# Patient Record
Sex: Male | Born: 1935 | Race: White | Hispanic: No | Marital: Married | Smoking: Former smoker
Health system: Southern US, Community
[De-identification: ages and names within clinical notes are randomized; demographics above are authoritative.]

## PROBLEM LIST (undated history)

## (undated) DIAGNOSIS — N183 Chronic kidney disease, stage 3 unspecified: Secondary | ICD-10-CM

## (undated) DIAGNOSIS — E7800 Pure hypercholesterolemia, unspecified: Secondary | ICD-10-CM

## (undated) DIAGNOSIS — Z95810 Presence of automatic (implantable) cardiac defibrillator: Secondary | ICD-10-CM

## (undated) DIAGNOSIS — D126 Benign neoplasm of colon, unspecified: Secondary | ICD-10-CM

## (undated) DIAGNOSIS — I8290 Acute embolism and thrombosis of unspecified vein: Secondary | ICD-10-CM

## (undated) DIAGNOSIS — I2510 Atherosclerotic heart disease of native coronary artery without angina pectoris: Secondary | ICD-10-CM

## (undated) DIAGNOSIS — I4729 Other ventricular tachycardia: Secondary | ICD-10-CM

## (undated) DIAGNOSIS — M545 Low back pain, unspecified: Secondary | ICD-10-CM

## (undated) DIAGNOSIS — M1990 Unspecified osteoarthritis, unspecified site: Secondary | ICD-10-CM

## (undated) DIAGNOSIS — I5022 Chronic systolic (congestive) heart failure: Secondary | ICD-10-CM

## (undated) DIAGNOSIS — I472 Ventricular tachycardia, unspecified: Secondary | ICD-10-CM

## (undated) DIAGNOSIS — D500 Iron deficiency anemia secondary to blood loss (chronic): Secondary | ICD-10-CM

## (undated) DIAGNOSIS — I10 Essential (primary) hypertension: Secondary | ICD-10-CM

## (undated) DIAGNOSIS — K5730 Diverticulosis of large intestine without perforation or abscess without bleeding: Secondary | ICD-10-CM

## (undated) DIAGNOSIS — Z8679 Personal history of other diseases of the circulatory system: Secondary | ICD-10-CM

## (undated) DIAGNOSIS — K56699 Other intestinal obstruction unspecified as to partial versus complete obstruction: Secondary | ICD-10-CM

## (undated) DIAGNOSIS — J449 Chronic obstructive pulmonary disease, unspecified: Secondary | ICD-10-CM

## (undated) DIAGNOSIS — H353190 Nonexudative age-related macular degeneration, unspecified eye, stage unspecified: Secondary | ICD-10-CM

## (undated) DIAGNOSIS — G8929 Other chronic pain: Secondary | ICD-10-CM

## (undated) DIAGNOSIS — F411 Generalized anxiety disorder: Secondary | ICD-10-CM

## (undated) DIAGNOSIS — E119 Type 2 diabetes mellitus without complications: Secondary | ICD-10-CM

## (undated) DIAGNOSIS — I739 Peripheral vascular disease, unspecified: Secondary | ICD-10-CM

## (undated) DIAGNOSIS — L219 Seborrheic dermatitis, unspecified: Secondary | ICD-10-CM

## (undated) DIAGNOSIS — M19049 Primary osteoarthritis, unspecified hand: Secondary | ICD-10-CM

## (undated) HISTORY — DX: Chronic kidney disease, stage 3 unspecified: N18.30

## (undated) HISTORY — DX: Iron deficiency anemia secondary to blood loss (chronic): D50.0

## (undated) HISTORY — DX: Essential (primary) hypertension: I10

## (undated) HISTORY — DX: Nonexudative age-related macular degeneration, unspecified eye, stage unspecified: H35.3190

## (undated) HISTORY — PX: TONSILLECTOMY: SUR1361

## (undated) HISTORY — DX: Generalized anxiety disorder: F41.1

## (undated) HISTORY — PX: OTHER SURGICAL HISTORY: SHX169

## (undated) HISTORY — DX: Chronic kidney disease, stage 3 (moderate): N18.3

## (undated) HISTORY — DX: Chronic obstructive pulmonary disease, unspecified: J44.9

## (undated) HISTORY — DX: Benign neoplasm of colon, unspecified: D12.6

## (undated) HISTORY — DX: Seborrheic dermatitis, unspecified: L21.9

## (undated) HISTORY — PX: TRANSTHORACIC ECHOCARDIOGRAM: SHX275

## (undated) HISTORY — DX: Pure hypercholesterolemia, unspecified: E78.00

## (undated) HISTORY — PX: CARDIOVASCULAR STRESS TEST: SHX262

## (undated) HISTORY — DX: Other intestinal obstruction unspecified as to partial versus complete obstruction: K56.699

## (undated) HISTORY — DX: Ventricular tachycardia, unspecified: I47.20

## (undated) HISTORY — DX: Atherosclerotic heart disease of native coronary artery without angina pectoris: I25.10

## (undated) HISTORY — DX: Diverticulosis of large intestine without perforation or abscess without bleeding: K57.30

## (undated) HISTORY — DX: Peripheral vascular disease, unspecified: I73.9

## (undated) HISTORY — DX: Ventricular tachycardia: I47.2

## (undated) HISTORY — DX: Chronic systolic (congestive) heart failure: I50.22

## (undated) HISTORY — DX: Other ventricular tachycardia: I47.29

---

## 1989-01-24 HISTORY — PX: CORONARY ARTERY BYPASS GRAFT: SHX141

## 2001-03-18 ENCOUNTER — Ambulatory Visit (HOSPITAL_COMMUNITY): Admission: RE | Admit: 2001-03-18 | Discharge: 2001-03-18 | Payer: Self-pay | Admitting: Gastroenterology

## 2001-03-18 ENCOUNTER — Encounter (INDEPENDENT_AMBULATORY_CARE_PROVIDER_SITE_OTHER): Payer: Self-pay | Admitting: Specialist

## 2001-03-31 ENCOUNTER — Ambulatory Visit (HOSPITAL_COMMUNITY): Admission: RE | Admit: 2001-03-31 | Discharge: 2001-03-31 | Payer: Self-pay | Admitting: Gastroenterology

## 2001-03-31 ENCOUNTER — Encounter: Payer: Self-pay | Admitting: Gastroenterology

## 2001-06-29 ENCOUNTER — Ambulatory Visit: Admission: RE | Admit: 2001-06-29 | Discharge: 2001-06-29 | Payer: Self-pay | Admitting: Surgery

## 2001-06-29 ENCOUNTER — Encounter: Admission: RE | Admit: 2001-06-29 | Discharge: 2001-06-29 | Payer: Self-pay | Admitting: Surgery

## 2001-06-29 ENCOUNTER — Encounter: Payer: Self-pay | Admitting: Surgery

## 2001-07-05 ENCOUNTER — Inpatient Hospital Stay (HOSPITAL_COMMUNITY): Admission: AD | Admit: 2001-07-05 | Discharge: 2001-07-10 | Payer: Self-pay | Admitting: Cardiology

## 2001-07-05 ENCOUNTER — Encounter: Payer: Self-pay | Admitting: Thoracic Surgery (Cardiothoracic Vascular Surgery)

## 2001-07-06 ENCOUNTER — Encounter: Payer: Self-pay | Admitting: Thoracic Surgery (Cardiothoracic Vascular Surgery)

## 2001-07-07 ENCOUNTER — Encounter: Payer: Self-pay | Admitting: Thoracic Surgery (Cardiothoracic Vascular Surgery)

## 2001-07-08 ENCOUNTER — Encounter: Payer: Self-pay | Admitting: Thoracic Surgery (Cardiothoracic Vascular Surgery)

## 2001-08-06 ENCOUNTER — Ambulatory Visit (HOSPITAL_COMMUNITY): Admission: RE | Admit: 2001-08-06 | Discharge: 2001-08-07 | Payer: Self-pay | Admitting: *Deleted

## 2002-12-10 ENCOUNTER — Inpatient Hospital Stay (HOSPITAL_COMMUNITY): Admission: EM | Admit: 2002-12-10 | Discharge: 2002-12-11 | Payer: Self-pay | Admitting: *Deleted

## 2002-12-10 ENCOUNTER — Encounter: Payer: Self-pay | Admitting: Endocrinology

## 2004-03-29 ENCOUNTER — Ambulatory Visit: Payer: Self-pay | Admitting: Pulmonary Disease

## 2004-05-29 ENCOUNTER — Ambulatory Visit: Payer: Self-pay | Admitting: Pulmonary Disease

## 2004-06-12 ENCOUNTER — Ambulatory Visit: Payer: Self-pay | Admitting: Pulmonary Disease

## 2004-07-09 ENCOUNTER — Ambulatory Visit: Payer: Self-pay | Admitting: Pulmonary Disease

## 2004-07-12 ENCOUNTER — Ambulatory Visit: Payer: Self-pay | Admitting: Pulmonary Disease

## 2004-07-12 ENCOUNTER — Ambulatory Visit (HOSPITAL_COMMUNITY): Admission: RE | Admit: 2004-07-12 | Discharge: 2004-07-12 | Payer: Self-pay | Admitting: Pulmonary Disease

## 2004-08-06 ENCOUNTER — Ambulatory Visit: Payer: Self-pay | Admitting: Pulmonary Disease

## 2004-10-18 ENCOUNTER — Ambulatory Visit: Payer: Self-pay | Admitting: Pulmonary Disease

## 2004-11-06 ENCOUNTER — Ambulatory Visit: Payer: Self-pay | Admitting: Pulmonary Disease

## 2004-12-21 ENCOUNTER — Encounter: Payer: Self-pay | Admitting: Emergency Medicine

## 2004-12-21 ENCOUNTER — Ambulatory Visit: Payer: Self-pay | Admitting: Infectious Diseases

## 2004-12-21 ENCOUNTER — Ambulatory Visit: Payer: Self-pay | Admitting: Dentistry

## 2004-12-21 ENCOUNTER — Inpatient Hospital Stay (HOSPITAL_COMMUNITY): Admission: AD | Admit: 2004-12-21 | Discharge: 2004-12-28 | Payer: Self-pay | Admitting: Cardiology

## 2004-12-23 ENCOUNTER — Ambulatory Visit: Payer: Self-pay | Admitting: Internal Medicine

## 2004-12-24 HISTORY — PX: CARDIAC DEFIBRILLATOR PLACEMENT: SHX171

## 2005-01-13 ENCOUNTER — Ambulatory Visit: Payer: Self-pay

## 2005-01-28 ENCOUNTER — Ambulatory Visit: Payer: Self-pay

## 2005-03-25 ENCOUNTER — Ambulatory Visit: Payer: Self-pay | Admitting: Pulmonary Disease

## 2005-04-03 ENCOUNTER — Ambulatory Visit: Payer: Self-pay | Admitting: Cardiology

## 2005-05-08 ENCOUNTER — Ambulatory Visit: Payer: Self-pay | Admitting: Cardiology

## 2005-05-23 ENCOUNTER — Ambulatory Visit: Payer: Self-pay | Admitting: Cardiology

## 2005-06-05 ENCOUNTER — Ambulatory Visit: Payer: Self-pay | Admitting: Cardiology

## 2005-06-12 ENCOUNTER — Ambulatory Visit: Payer: Self-pay | Admitting: Cardiology

## 2005-07-18 ENCOUNTER — Ambulatory Visit: Payer: Self-pay | Admitting: Cardiology

## 2005-07-22 ENCOUNTER — Ambulatory Visit: Payer: Self-pay | Admitting: Pulmonary Disease

## 2005-07-23 ENCOUNTER — Ambulatory Visit: Payer: Self-pay | Admitting: Pulmonary Disease

## 2005-07-24 ENCOUNTER — Ambulatory Visit: Payer: Self-pay | Admitting: Pulmonary Disease

## 2005-07-25 ENCOUNTER — Ambulatory Visit: Payer: Self-pay | Admitting: Internal Medicine

## 2005-07-25 ENCOUNTER — Inpatient Hospital Stay (HOSPITAL_COMMUNITY): Admission: EM | Admit: 2005-07-25 | Discharge: 2005-07-29 | Payer: Self-pay | Admitting: Emergency Medicine

## 2005-08-05 ENCOUNTER — Ambulatory Visit: Payer: Self-pay | Admitting: Pulmonary Disease

## 2005-08-19 ENCOUNTER — Inpatient Hospital Stay (HOSPITAL_COMMUNITY): Admission: EM | Admit: 2005-08-19 | Discharge: 2005-08-21 | Payer: Self-pay | Admitting: Emergency Medicine

## 2005-08-19 ENCOUNTER — Ambulatory Visit: Payer: Self-pay | Admitting: Pulmonary Disease

## 2005-08-19 ENCOUNTER — Ambulatory Visit: Payer: Self-pay | Admitting: Cardiology

## 2005-08-28 ENCOUNTER — Ambulatory Visit: Payer: Self-pay | Admitting: Pulmonary Disease

## 2005-10-02 ENCOUNTER — Ambulatory Visit: Payer: Self-pay | Admitting: Cardiology

## 2005-10-08 ENCOUNTER — Ambulatory Visit: Payer: Self-pay | Admitting: Pulmonary Disease

## 2005-11-04 ENCOUNTER — Ambulatory Visit: Payer: Self-pay | Admitting: Cardiology

## 2005-11-28 ENCOUNTER — Ambulatory Visit: Payer: Self-pay | Admitting: Internal Medicine

## 2005-12-01 ENCOUNTER — Ambulatory Visit: Payer: Self-pay | Admitting: Pulmonary Disease

## 2006-01-16 ENCOUNTER — Ambulatory Visit: Payer: Self-pay

## 2006-01-29 ENCOUNTER — Ambulatory Visit: Payer: Self-pay | Admitting: Cardiology

## 2006-02-11 ENCOUNTER — Ambulatory Visit: Payer: Self-pay

## 2006-03-09 ENCOUNTER — Ambulatory Visit: Payer: Self-pay | Admitting: Pulmonary Disease

## 2006-03-16 ENCOUNTER — Ambulatory Visit: Payer: Self-pay | Admitting: Pulmonary Disease

## 2006-03-16 LAB — CONVERTED CEMR LAB
Albumin: 3.9 g/dL (ref 3.5–5.2)
Alkaline Phosphatase: 45 units/L (ref 39–117)
BUN: 19 mg/dL (ref 6–23)
Basophils Absolute: 0 10*3/uL (ref 0.0–0.1)
CO2: 30 meq/L (ref 19–32)
Calcium: 9.1 mg/dL (ref 8.4–10.5)
Chol/HDL Ratio, serum: 6.4
Creatinine, Ser: 1.7 mg/dL — ABNORMAL HIGH (ref 0.4–1.5)
Eosinophil percent: 2.4 % (ref 0.0–5.0)
GFR calc non Af Amer: 43 mL/min
HCT: 40.1 % (ref 39.0–52.0)
HDL: 24.2 mg/dL — ABNORMAL LOW (ref >39.0)
Lymphocytes Relative: 21.4 % (ref 12.0–46.0)
MCHC: 32.9 g/dL (ref 30.0–36.0)
Monocytes Relative: 11.2 % — ABNORMAL HIGH (ref 3.0–11.0)
Neutro Abs: 7 10*3/uL (ref 1.4–7.7)
RBC: 4.45 M/uL (ref 4.22–5.81)
Sodium: 140 meq/L (ref 135–145)
Total Protein: 6.6 g/dL (ref 6.0–8.3)
VLDL: 33 mg/dL (ref 0–40)
WBC: 10.8 10*3/uL — ABNORMAL HIGH (ref 4.5–10.5)

## 2006-04-06 ENCOUNTER — Ambulatory Visit: Payer: Self-pay

## 2006-06-09 ENCOUNTER — Ambulatory Visit: Payer: Self-pay | Admitting: Pulmonary Disease

## 2006-07-11 ENCOUNTER — Emergency Department (HOSPITAL_COMMUNITY): Admission: EM | Admit: 2006-07-11 | Discharge: 2006-07-11 | Payer: Self-pay | Admitting: Emergency Medicine

## 2006-07-11 ENCOUNTER — Ambulatory Visit: Payer: Self-pay | Admitting: Internal Medicine

## 2006-08-07 ENCOUNTER — Ambulatory Visit: Payer: Self-pay | Admitting: Pulmonary Disease

## 2006-08-07 LAB — CONVERTED CEMR LAB
ALT: 22 units/L (ref 0–40)
Albumin: 4 g/dL (ref 3.5–5.2)
BUN: 24 mg/dL — ABNORMAL HIGH (ref 6–23)
Calcium: 9.3 mg/dL (ref 8.4–10.5)
Cholesterol: 164 mg/dL (ref 0–200)
Eosinophils Relative: 3 % (ref 0.0–5.0)
GFR calc Af Amer: 55 mL/min
GFR calc non Af Amer: 46 mL/min
Glucose, Bld: 119 mg/dL — ABNORMAL HIGH (ref 70–99)
HCT: 42.7 % (ref 39.0–52.0)
Hgb A1c MFr Bld: 7.6 % — ABNORMAL HIGH (ref 4.6–6.0)
Lymphocytes Relative: 35.9 % (ref 12.0–46.0)
MCHC: 33.4 g/dL (ref 30.0–36.0)
MCV: 86.8 fL (ref 78.0–100.0)
Monocytes Relative: 10.6 % (ref 3.0–11.0)
Neutrophils Relative %: 50.2 % (ref 43.0–77.0)
RBC: 4.92 M/uL (ref 4.22–5.81)
RDW: 14.1 % (ref 11.5–14.6)
Sodium: 141 meq/L (ref 135–145)
TSH: 1.66 microintl units/mL (ref 0.35–5.50)
Total Bilirubin: 0.7 mg/dL (ref 0.3–1.2)
Total Protein: 6.5 g/dL (ref 6.0–8.3)
WBC: 8.1 10*3/uL (ref 4.5–10.5)

## 2006-09-08 ENCOUNTER — Ambulatory Visit: Payer: Self-pay | Admitting: Pulmonary Disease

## 2006-12-23 ENCOUNTER — Ambulatory Visit: Payer: Self-pay

## 2007-01-06 ENCOUNTER — Ambulatory Visit: Payer: Self-pay | Admitting: Pulmonary Disease

## 2007-01-15 ENCOUNTER — Ambulatory Visit: Payer: Self-pay | Admitting: Pulmonary Disease

## 2007-01-15 LAB — CONVERTED CEMR LAB
AST: 23 units/L (ref 0–37)
Albumin: 3.9 g/dL (ref 3.5–5.2)
Basophils Relative: 0.3 % (ref 0.0–1.0)
Chloride: 105 meq/L (ref 96–112)
Direct LDL: 69 mg/dL
Eosinophils Relative: 3 % (ref 0.0–5.0)
Glucose, Bld: 114 mg/dL — ABNORMAL HIGH (ref 70–99)
Monocytes Absolute: 1 10*3/uL — ABNORMAL HIGH (ref 0.2–0.7)
PSA: 0.66 ng/mL (ref 0.10–4.00)
Platelets: 211 10*3/uL (ref 150–400)
Pro B Natriuretic peptide (BNP): 40 pg/mL (ref 0.0–100.0)
RBC: 4.8 M/uL (ref 4.22–5.81)
RDW: 13.2 % (ref 11.5–14.6)
Total CHOL/HDL Ratio: 6.1
Total Protein: 6.5 g/dL (ref 6.0–8.3)
Triglycerides: 297 mg/dL (ref 0–149)
WBC: 8.3 10*3/uL (ref 4.5–10.5)

## 2007-01-22 ENCOUNTER — Ambulatory Visit: Payer: Self-pay | Admitting: Pulmonary Disease

## 2007-01-22 LAB — CONVERTED CEMR LAB
CO2: 30 meq/L (ref 19–32)
Creatinine, Ser: 1.3 mg/dL (ref 0.4–1.5)
GFR calc Af Amer: 70 mL/min
GFR calc non Af Amer: 58 mL/min
Glucose, Bld: 171 mg/dL — ABNORMAL HIGH (ref 70–99)
Potassium: 5.4 meq/L — ABNORMAL HIGH (ref 3.5–5.1)

## 2007-03-10 ENCOUNTER — Inpatient Hospital Stay (HOSPITAL_COMMUNITY): Admission: EM | Admit: 2007-03-10 | Discharge: 2007-03-13 | Payer: Self-pay | Admitting: Emergency Medicine

## 2007-03-10 ENCOUNTER — Ambulatory Visit: Payer: Self-pay | Admitting: Cardiology

## 2007-03-10 ENCOUNTER — Ambulatory Visit: Payer: Self-pay | Admitting: Internal Medicine

## 2007-03-10 LAB — CONVERTED CEMR LAB
ALT: 22 units/L (ref 0–53)
Albumin: 4.1 g/dL (ref 3.5–5.2)
Alkaline Phosphatase: 75 units/L (ref 39–117)
Basophils Relative: 0.5 % (ref 0.0–1.0)
Bilirubin, Direct: 0.1 mg/dL (ref 0.0–0.3)
CO2: 31 meq/L (ref 19–32)
Calcium: 9.2 mg/dL (ref 8.4–10.5)
GFR calc non Af Amer: 53 mL/min
HCT: 40.9 % (ref 39.0–52.0)
Hemoglobin: 14.2 g/dL (ref 13.0–17.0)
MCV: 88.4 fL (ref 78.0–100.0)
Magnesium: 1.4 mg/dL — ABNORMAL LOW (ref 1.5–2.5)
Monocytes Relative: 9.8 % (ref 3.0–11.0)
Neutrophils Relative %: 56.9 % (ref 43.0–77.0)
RDW: 13.1 % (ref 11.5–14.6)
Total Protein: 6.6 g/dL (ref 6.0–8.3)

## 2007-03-24 ENCOUNTER — Ambulatory Visit: Payer: Self-pay | Admitting: Pulmonary Disease

## 2007-03-24 DIAGNOSIS — I739 Peripheral vascular disease, unspecified: Secondary | ICD-10-CM | POA: Insufficient documentation

## 2007-03-24 DIAGNOSIS — I10 Essential (primary) hypertension: Secondary | ICD-10-CM

## 2007-03-24 DIAGNOSIS — D126 Benign neoplasm of colon, unspecified: Secondary | ICD-10-CM

## 2007-03-24 DIAGNOSIS — E7800 Pure hypercholesterolemia, unspecified: Secondary | ICD-10-CM | POA: Insufficient documentation

## 2007-03-24 LAB — CONVERTED CEMR LAB
GFR calc Af Amer: 52 mL/min
Potassium: 5.5 meq/L — ABNORMAL HIGH (ref 3.5–5.1)

## 2007-04-01 ENCOUNTER — Ambulatory Visit: Payer: Self-pay | Admitting: Internal Medicine

## 2007-04-01 LAB — CONVERTED CEMR LAB
CO2: 32 meq/L (ref 19–32)
Chloride: 102 meq/L (ref 96–112)
GFR calc Af Amer: 45 mL/min
GFR calc non Af Amer: 38 mL/min
Pro B Natriuretic peptide (BNP): 191 pg/mL — ABNORMAL HIGH (ref 0.0–100.0)
Sodium: 140 meq/L (ref 135–145)

## 2007-04-02 ENCOUNTER — Ambulatory Visit: Payer: Self-pay | Admitting: Cardiology

## 2007-04-08 ENCOUNTER — Ambulatory Visit: Payer: Self-pay | Admitting: Pulmonary Disease

## 2007-05-24 ENCOUNTER — Telehealth: Payer: Self-pay | Admitting: Pulmonary Disease

## 2007-05-25 ENCOUNTER — Ambulatory Visit: Payer: Self-pay | Admitting: Pulmonary Disease

## 2007-07-02 LAB — CONVERTED CEMR LAB
BUN: 22 mg/dL (ref 6–23)
Calcium: 9 mg/dL (ref 8.4–10.5)
Chloride: 98 meq/L (ref 96–112)
Creatinine, Ser: 1.7 mg/dL — ABNORMAL HIGH (ref 0.4–1.5)
GFR calc Af Amer: 52 mL/min
Glucose, Bld: 169 mg/dL — ABNORMAL HIGH (ref 70–99)
Sodium: 140 meq/L (ref 135–145)

## 2007-07-04 ENCOUNTER — Emergency Department (HOSPITAL_COMMUNITY): Admission: EM | Admit: 2007-07-04 | Discharge: 2007-07-05 | Payer: Self-pay | Admitting: Emergency Medicine

## 2007-07-05 ENCOUNTER — Telehealth (INDEPENDENT_AMBULATORY_CARE_PROVIDER_SITE_OTHER): Payer: Self-pay | Admitting: *Deleted

## 2007-07-06 ENCOUNTER — Ambulatory Visit: Payer: Self-pay | Admitting: Pulmonary Disease

## 2007-07-06 LAB — CONVERTED CEMR LAB
BUN: 17 mg/dL (ref 6–23)
Chloride: 101 meq/L (ref 96–112)
Creatinine, Ser: 1.6 mg/dL — ABNORMAL HIGH (ref 0.4–1.5)
GFR calc Af Amer: 55 mL/min
Glucose, Bld: 83 mg/dL (ref 70–99)
Hgb A1c MFr Bld: 6.8 % — ABNORMAL HIGH (ref 4.6–6.0)

## 2007-08-02 ENCOUNTER — Ambulatory Visit: Payer: Self-pay | Admitting: Pulmonary Disease

## 2007-08-02 DIAGNOSIS — F411 Generalized anxiety disorder: Secondary | ICD-10-CM | POA: Insufficient documentation

## 2007-08-02 DIAGNOSIS — K5730 Diverticulosis of large intestine without perforation or abscess without bleeding: Secondary | ICD-10-CM | POA: Insufficient documentation

## 2007-08-02 DIAGNOSIS — N259 Disorder resulting from impaired renal tubular function, unspecified: Secondary | ICD-10-CM | POA: Insufficient documentation

## 2007-08-02 DIAGNOSIS — I2510 Atherosclerotic heart disease of native coronary artery without angina pectoris: Secondary | ICD-10-CM

## 2007-08-02 DIAGNOSIS — M1990 Unspecified osteoarthritis, unspecified site: Secondary | ICD-10-CM | POA: Insufficient documentation

## 2007-08-02 DIAGNOSIS — E119 Type 2 diabetes mellitus without complications: Secondary | ICD-10-CM

## 2007-08-02 DIAGNOSIS — J209 Acute bronchitis, unspecified: Secondary | ICD-10-CM | POA: Insufficient documentation

## 2007-08-02 DIAGNOSIS — I472 Ventricular tachycardia: Secondary | ICD-10-CM

## 2007-09-01 ENCOUNTER — Ambulatory Visit: Payer: Self-pay | Admitting: Pulmonary Disease

## 2007-10-08 ENCOUNTER — Ambulatory Visit: Payer: Self-pay | Admitting: Cardiology

## 2007-10-12 ENCOUNTER — Ambulatory Visit: Payer: Self-pay | Admitting: Cardiology

## 2007-10-12 LAB — CONVERTED CEMR LAB
Albumin: 4 g/dL (ref 3.5–5.2)
Bilirubin, Direct: 0.1 mg/dL (ref 0.0–0.3)
Calcium: 9.3 mg/dL (ref 8.4–10.5)
Chloride: 104 meq/L (ref 96–112)
Glucose, Bld: 113 mg/dL — ABNORMAL HIGH (ref 70–99)
Potassium: 5.1 meq/L (ref 3.5–5.1)

## 2007-11-22 ENCOUNTER — Ambulatory Visit: Payer: Self-pay | Admitting: Pulmonary Disease

## 2007-12-29 ENCOUNTER — Telehealth (INDEPENDENT_AMBULATORY_CARE_PROVIDER_SITE_OTHER): Payer: Self-pay | Admitting: *Deleted

## 2007-12-31 ENCOUNTER — Ambulatory Visit: Payer: Self-pay | Admitting: Pulmonary Disease

## 2008-01-07 LAB — CONVERTED CEMR LAB
Albumin: 3.9 g/dL (ref 3.5–5.2)
BUN: 14 mg/dL (ref 6–23)
Bilirubin, Direct: 0.1 mg/dL (ref 0.0–0.3)
Calcium: 9.1 mg/dL (ref 8.4–10.5)
Chloride: 107 meq/L (ref 96–112)
Cholesterol: 124 mg/dL (ref 0–200)
Direct LDL: 64.7 mg/dL
GFR calc non Af Amer: 64 mL/min
Glucose, Bld: 118 mg/dL — ABNORMAL HIGH (ref 70–99)
Hgb A1c MFr Bld: 7.3 % — ABNORMAL HIGH (ref 4.6–6.0)
Sodium: 142 meq/L (ref 135–145)
TSH: 2.87 microintl units/mL (ref 0.35–5.50)
Total Protein: 6.5 g/dL (ref 6.0–8.3)
VLDL: 51 mg/dL — ABNORMAL HIGH (ref 0–40)

## 2008-02-08 ENCOUNTER — Ambulatory Visit: Payer: Self-pay | Admitting: Internal Medicine

## 2008-02-23 ENCOUNTER — Ambulatory Visit: Payer: Self-pay | Admitting: Pulmonary Disease

## 2008-03-13 ENCOUNTER — Encounter: Payer: Self-pay | Admitting: Pulmonary Disease

## 2008-03-13 ENCOUNTER — Ambulatory Visit: Payer: Self-pay

## 2008-04-27 ENCOUNTER — Ambulatory Visit: Payer: Self-pay

## 2008-05-29 ENCOUNTER — Telehealth (INDEPENDENT_AMBULATORY_CARE_PROVIDER_SITE_OTHER): Payer: Self-pay | Admitting: *Deleted

## 2008-06-06 ENCOUNTER — Ambulatory Visit: Payer: Self-pay | Admitting: Pulmonary Disease

## 2008-06-12 ENCOUNTER — Telehealth (INDEPENDENT_AMBULATORY_CARE_PROVIDER_SITE_OTHER): Payer: Self-pay | Admitting: *Deleted

## 2008-06-13 ENCOUNTER — Ambulatory Visit: Payer: Self-pay | Admitting: Pulmonary Disease

## 2008-06-13 LAB — CONVERTED CEMR LAB
CO2: 35 meq/L — ABNORMAL HIGH (ref 19–32)
Creatinine, Ser: 1.9 mg/dL — ABNORMAL HIGH (ref 0.4–1.5)
GFR calc Af Amer: 45 mL/min
GFR calc non Af Amer: 37 mL/min
Glucose, Bld: 115 mg/dL — ABNORMAL HIGH (ref 70–99)
Sodium: 142 meq/L (ref 135–145)

## 2008-06-16 ENCOUNTER — Ambulatory Visit: Payer: Self-pay | Admitting: Cardiology

## 2008-06-19 ENCOUNTER — Ambulatory Visit: Payer: Self-pay | Admitting: Pulmonary Disease

## 2008-06-20 LAB — CONVERTED CEMR LAB
CO2: 34 meq/L — ABNORMAL HIGH (ref 19–32)
Chloride: 104 meq/L (ref 96–112)
GFR calc Af Amer: 55 mL/min
Glucose, Bld: 85 mg/dL (ref 70–99)

## 2008-07-04 ENCOUNTER — Telehealth (INDEPENDENT_AMBULATORY_CARE_PROVIDER_SITE_OTHER): Payer: Self-pay | Admitting: *Deleted

## 2008-08-04 ENCOUNTER — Encounter: Payer: Self-pay | Admitting: Internal Medicine

## 2008-08-10 ENCOUNTER — Ambulatory Visit: Payer: Self-pay

## 2008-10-03 ENCOUNTER — Encounter (INDEPENDENT_AMBULATORY_CARE_PROVIDER_SITE_OTHER): Payer: Self-pay | Admitting: *Deleted

## 2008-11-10 ENCOUNTER — Encounter (INDEPENDENT_AMBULATORY_CARE_PROVIDER_SITE_OTHER): Payer: Self-pay | Admitting: *Deleted

## 2008-11-16 ENCOUNTER — Ambulatory Visit: Payer: Self-pay

## 2009-01-22 ENCOUNTER — Encounter: Payer: Self-pay | Admitting: Internal Medicine

## 2009-01-22 ENCOUNTER — Ambulatory Visit: Payer: Self-pay | Admitting: Cardiology

## 2009-01-30 ENCOUNTER — Ambulatory Visit: Payer: Self-pay | Admitting: Cardiology

## 2009-01-30 ENCOUNTER — Ambulatory Visit: Payer: Self-pay | Admitting: Internal Medicine

## 2009-01-30 DIAGNOSIS — I5022 Chronic systolic (congestive) heart failure: Secondary | ICD-10-CM

## 2009-01-31 ENCOUNTER — Ambulatory Visit: Payer: Self-pay | Admitting: Pulmonary Disease

## 2009-02-01 ENCOUNTER — Telehealth (INDEPENDENT_AMBULATORY_CARE_PROVIDER_SITE_OTHER): Payer: Self-pay | Admitting: *Deleted

## 2009-02-07 ENCOUNTER — Telehealth: Payer: Self-pay | Admitting: Internal Medicine

## 2009-02-14 ENCOUNTER — Ambulatory Visit: Payer: Self-pay | Admitting: Pulmonary Disease

## 2009-02-14 DIAGNOSIS — R748 Abnormal levels of other serum enzymes: Secondary | ICD-10-CM | POA: Insufficient documentation

## 2009-02-15 LAB — CONVERTED CEMR LAB
Albumin: 4 g/dL (ref 3.5–5.2)
Alkaline Phosphatase: 31 units/L — ABNORMAL LOW (ref 39–117)
Basophils Absolute: 0 10*3/uL (ref 0.0–0.1)
Basophils Relative: 0.5 % (ref 0.0–3.0)
Bilirubin, Direct: 0.2 mg/dL (ref 0.0–0.3)
CO2: 36 meq/L — ABNORMAL HIGH (ref 19–32)
Eosinophils Absolute: 0.2 10*3/uL (ref 0.0–0.7)
Eosinophils Relative: 2.9 % (ref 0.0–5.0)
HCT: 44.1 % (ref 39.0–52.0)
HDL: 25.6 mg/dL — ABNORMAL LOW (ref >39.00)
Hemoglobin: 14.7 g/dL (ref 13.0–17.0)
MCV: 90.1 fL (ref 78.0–100.0)
Monocytes Absolute: 0.9 10*3/uL (ref 0.1–1.0)
Neutro Abs: 3.5 10*3/uL (ref 1.4–7.7)
PSA: 0.47 ng/mL (ref 0.10–4.00)
Platelets: 172 10*3/uL (ref 150.0–400.0)
Potassium: 5.6 meq/L — ABNORMAL HIGH (ref 3.5–5.1)
Sodium: 143 meq/L (ref 135–145)
Total CHOL/HDL Ratio: 5
Total Protein: 6.9 g/dL (ref 6.0–8.3)
Triglycerides: 142 mg/dL (ref 0.0–149.0)
VLDL: 28.4 mg/dL (ref 0.0–40.0)
WBC: 7.2 10*3/uL (ref 4.5–10.5)

## 2009-03-15 ENCOUNTER — Encounter (INDEPENDENT_AMBULATORY_CARE_PROVIDER_SITE_OTHER): Payer: Self-pay | Admitting: *Deleted

## 2009-04-30 ENCOUNTER — Ambulatory Visit: Payer: Self-pay | Admitting: Diagnostic Radiology

## 2009-04-30 ENCOUNTER — Emergency Department (HOSPITAL_BASED_OUTPATIENT_CLINIC_OR_DEPARTMENT_OTHER): Admission: EM | Admit: 2009-04-30 | Discharge: 2009-04-30 | Payer: Self-pay | Admitting: Emergency Medicine

## 2009-05-08 ENCOUNTER — Ambulatory Visit: Payer: Self-pay | Admitting: Cardiology

## 2009-05-11 ENCOUNTER — Ambulatory Visit: Payer: Self-pay | Admitting: Adult Health

## 2009-05-11 ENCOUNTER — Telehealth (INDEPENDENT_AMBULATORY_CARE_PROVIDER_SITE_OTHER): Payer: Self-pay | Admitting: *Deleted

## 2009-05-22 ENCOUNTER — Ambulatory Visit: Payer: Self-pay | Admitting: Cardiology

## 2009-05-24 LAB — CONVERTED CEMR LAB
BUN: 32 mg/dL — ABNORMAL HIGH (ref 6–23)
CO2: 33 meq/L — ABNORMAL HIGH (ref 19–32)
Calcium: 9.3 mg/dL (ref 8.4–10.5)
Creatinine, Ser: 1.7 mg/dL — ABNORMAL HIGH (ref 0.4–1.5)
GFR calc non Af Amer: 42.34 mL/min (ref >60)
Glucose, Bld: 184 mg/dL — ABNORMAL HIGH (ref 70–99)

## 2009-05-29 ENCOUNTER — Telehealth: Payer: Self-pay | Admitting: Cardiology

## 2009-05-30 ENCOUNTER — Ambulatory Visit: Payer: Self-pay | Admitting: Pulmonary Disease

## 2009-05-30 DIAGNOSIS — L219 Seborrheic dermatitis, unspecified: Secondary | ICD-10-CM

## 2009-06-11 ENCOUNTER — Ambulatory Visit: Payer: Self-pay | Admitting: Cardiology

## 2009-06-17 LAB — CONVERTED CEMR LAB: Chloride: 100 meq/L (ref 96–112)

## 2009-08-06 ENCOUNTER — Ambulatory Visit: Payer: Self-pay | Admitting: Cardiology

## 2009-08-06 ENCOUNTER — Encounter: Payer: Self-pay | Admitting: Internal Medicine

## 2009-08-27 ENCOUNTER — Telehealth (INDEPENDENT_AMBULATORY_CARE_PROVIDER_SITE_OTHER): Payer: Self-pay | Admitting: *Deleted

## 2009-08-28 ENCOUNTER — Ambulatory Visit: Payer: Self-pay | Admitting: Pulmonary Disease

## 2009-08-28 ENCOUNTER — Ambulatory Visit: Payer: Self-pay | Admitting: Cardiology

## 2009-08-30 ENCOUNTER — Ambulatory Visit: Payer: Self-pay | Admitting: Pulmonary Disease

## 2009-08-30 LAB — CONVERTED CEMR LAB
Cholesterol: 131 mg/dL
HDL: 33.1 mg/dL — ABNORMAL LOW
Hgb A1c MFr Bld: 6.9 % — ABNORMAL HIGH
LDL Cholesterol: 70 mg/dL
Total CHOL/HDL Ratio: 4
Triglycerides: 139 mg/dL
VLDL: 27.8 mg/dL

## 2009-09-02 LAB — CONVERTED CEMR LAB
CO2: 33 meq/L — ABNORMAL HIGH (ref 19–32)
Chloride: 102 meq/L (ref 96–112)
Glucose, Bld: 65 mg/dL — ABNORMAL LOW (ref 70–99)

## 2009-12-05 ENCOUNTER — Ambulatory Visit: Payer: Self-pay | Admitting: Pulmonary Disease

## 2009-12-06 DIAGNOSIS — B370 Candidal stomatitis: Secondary | ICD-10-CM

## 2010-02-26 ENCOUNTER — Telehealth (INDEPENDENT_AMBULATORY_CARE_PROVIDER_SITE_OTHER): Payer: Self-pay | Admitting: *Deleted

## 2010-03-01 ENCOUNTER — Ambulatory Visit: Payer: Self-pay | Admitting: Pulmonary Disease

## 2010-03-22 ENCOUNTER — Ambulatory Visit: Payer: Self-pay | Admitting: Cardiology

## 2010-03-22 ENCOUNTER — Encounter: Payer: Self-pay | Admitting: Internal Medicine

## 2010-04-16 ENCOUNTER — Encounter: Payer: Self-pay | Admitting: Pulmonary Disease

## 2010-04-16 DIAGNOSIS — I739 Peripheral vascular disease, unspecified: Secondary | ICD-10-CM

## 2010-04-17 ENCOUNTER — Ambulatory Visit: Payer: Self-pay

## 2010-04-17 ENCOUNTER — Ambulatory Visit: Payer: Self-pay | Admitting: Internal Medicine

## 2010-04-17 DIAGNOSIS — Z95810 Presence of automatic (implantable) cardiac defibrillator: Secondary | ICD-10-CM

## 2010-06-16 ENCOUNTER — Encounter: Payer: Self-pay | Admitting: Pulmonary Disease

## 2010-06-23 LAB — CONVERTED CEMR LAB: Pro B Natriuretic peptide (BNP): 102 pg/mL — ABNORMAL HIGH (ref 0.0–100.0)

## 2010-06-25 NOTE — Assessment & Plan Note (Signed)
Summary: 3 months/apc   Primary Care Provider:   Dr. Alroy Dust  CC:  3 month ROV & review of mult medical problems....  History of Present Illness: 75 y/o WM here for a follow up visit... he has multiple medical problems as noted below...    ~  Sep10:  I last saw him 9/09 for routine f/u visit- stable on his regular dosing of Advair500, Mucinex, & Xopenex... he misses several f/u appts and in the interim stopped his Metoprolol, Vasotec, ?others... he has followed up w/ DrHochrein & DrKlein- doing well, he says, & DrH restarted BBlocker Coreg & Enalapril Rx... he states that he's been regular w/ his Glucovance (2.5/500- 1/2 Bid) although he hasn't checked his BS recently... he also states that he's been taking his Prav80 + Fenofib160 daily (overdue for fasting labs)... I called [facility] on [address] and checked refills- he's been getting regular refills recently.   ~  Jan11:  he had a bronchitic exac rx'd w/ Avelox, Pred, Mucinex, Tussionex & improved... discussed the need to continue the AdvairBid regularly + the Mucinex Bid, etc... DrHochrein recently incr the Coreg to 12.5Bid & decr the Lasix to 20mg  Qod due to low BP... he has mod seb derm- Rx w/ Selsun, T-gel & Lotrisone cream...   ~  August 30, 2009:  he reports a good 52mo- no new complaints or concerns... states he's not taking Lasix regularly, just Prn for swelling since BP is low... he notes occas nocturnal hypoglycenic episodes but has been taking the PM Glucovance Qhs> we discussed changing this to dinnertime dose... no CP, palpit/ resp exacerbations, etc...    Current Problem List:  ASTHMATIC BRONCHITIS, ACUTE (ICD-466.0) - occas acute infectious exacerbations...  ~  12/10: had bronchitic exac Rx'd w/ Avelox, Pred, Mucinex, etc... improved.  COPD (ICD-496) - ex-smoker, quit 2002... he clearly does better on regular dosing of ADVAIR 500Bid, MUCINEX 1200mg Bid, XOPENEX Prn... urged to continue these regularly.  HYPERTENSION  (ICD-401.9) - meds for BP & his Chr CHF= BBlocker COREG 12.5mg Bid,  ENALAPRIL 2.5mg /d, & LASIX 20mg  Qod (he's only taking this Prn swelling)...  BP=100/60 today, and feeling well- he denies HA, fatigue, visual changes, CP, palipit, dizziness, syncope, edema, etc...  ~  labs 1/10 showed K= 5.6, BUN= 31, Creat= 1.9..Marland Kitchen ACE/ ARB stopped... pt didn't follow up.  ~  8/10: Coreg + Enalapril restarted by DrHochrein...  ~  labs 9/10 showed K=5.6, BUN=19, Creat=1.5  ~  labs 12/10 showed K=4.4, BUN= 32, Creat= 1.7, BNP= 102  ~  labs 4/11 showed K=5.1, BUN=16, Creat=1.4  CORONARY ARTERY DISEASE (ICD-414.00) - no chest pains, no palpit, no defib discharges...  ~ Hx 3 vessel CAD w/ CABG x4 in 2/03 for LMain dis & 3vessel dis + mod LVD EF=35% at that time...  ~ Cath 7/06 w/ non-filling of SVG to PDA, & LIMA to LAD was atretic, w/ EF=45%...  ~ Cath 10/08 w/ norm SVG to PDA, & atretic LIMA to LAD (due to good native vessel flow), EF=40%... note is made of a 40-50% LMain lesion...  CONGESTIVE HEART FAILURE, SYSTOLIC, CHRONIC (ICD-428.0) - ischemic cardiomyopathy & meds as above... followed by DrHochrein in the CHF clinic...  see labs above>>  ~  seen 8/10 in [facility] clinic- Coreg + Enalapril restarted, labs reviewed.  Hx of VENTRICULAR TACHYCARDIA (ICD-427.1) - prev documented medication non-compliance w/ Amio & Mexilitene... DrHochrein has adjusted his meds...  ~ hx VTach storm w/ AICD placed, followed by DrTaylor/Klein on meds...  ~  DC'd 10/08 on AMIODARONE 200mg - 2 tabs twice daily, and MEXILETINE 150mg Bid... subseq decr AMIO 200mg Bid...  ~ f/u w/ DrKlein 9/09 w/ defib check- doing well...  ~  f/u 9/10 w/ DrKlein on Mexilitene alone, Amio stopped for intolerance... doing well- no changes made.  PERIPHERAL VASCULAR DISEASE (ICD-443.9)  ~ s/p PTA to right SFA for an ABI of 0.36...   ~  CDopplers 10/09 showed bilat carotid disease w/ bruits and mod plaque in bulbs, 0-39% bilat... no change.  HYPERCHOLESTEROLEMIA  (ICD-272.0) - prev on Simvastatin 40mg /d, and changed to PRAVASTATIN 80mg /d per DrHochrein (due to interaction of Simva & Amio)... FENOFIBRATE 160mg /d added 8/09...  ~  FLP 8/08 on Simva40 showed TChol 136, TG 297, HDL 22, LDL 69- but he was ?not taking meds regularly? he freq forgets.  ~  FLP 8/09 on Prav80 showed TChol 124, TG 257, HDL 24, LDL 65... rec to add Fenofibrate 160/d...  ~  FLP 9/10 on Prav80 showed TChol 137, TG 142, HDL 26, LDL 83... continue both meds.  ~  FLP 4/11 showed TChol 131, TG 139, HDL 33, LDL 70  DIABETES MELLITUS (ICD-250.00) - on GLUCOVANCE 2.5/500- 1/2 Bid... he states his home BS checks have all been around 100 w/ occas nocturnal hypoglycemia> advised to take PM dose at dinner, not bedtime.  ~   prev BS=177, HgA1c=7.0 in Oct08...  ~  labs 2/09 showed BS=83, HgA1c=6.8  ~  labs 8/09 showed BS= 118, HgA1c= 7.3  ~  labs 9/10 showed BS= 90, A1c= 7.2  ~  labs 4/11 showed BS= 65, A1c= 6.9  DIVERTICULOSIS OF COLON (ICD-562.10) - last colonoscopy was 10/02 by [facility] showing divertics only... prev polypectomy 1997 for tubular adenoma... overdue for f/u exam... COLONIC POLYPS (ICD-211.3)  RENAL INSUFFICIENCY (ICD-588.9) - Creat in the 1.6-1.7 range...  ~  labs 8/09 showed BUN= 14, Creat= 1.2  ~  labs 1/10 showed BUN= 31, Creat= 1.9 & ACE was held... f/u improved.  ~  labs 9/10 showed BUN= 19, Creat= 1.5  DEGENERATIVE JOINT DISEASE (ICD-715.90)  ANXIETY (ICD-300.00)  SEBORRHEIC DERMATITIS (ICD-690.10) - he has mod seb derm- discussed Rx w/ Selsun/ T-Gel/ Lotrisone Cream Prn...   Allergies: 1)  ! Niaspan (Niacin (Antihyperlipidemic))  Comments:  Nurse/Medical Assistant: The patient's medications and allergies were reviewed with the patient and were updated in the Medication and Allergy Lists.  Past History:  Past Medical History: ASTHMATIC BRONCHITIS, ACUTE (ICD-466.0) COPD (ICD-496) - ex-smoker, quit 2002... HYPERTENSION (ICD-401.9)    CORONARY ARTERY  DISEASE (ICD-414.00)(last catheterization in October of 2008 demonstrated the left main had 40-50% stenosis, the LAD had minor irregularities, the circumflex had 80% stenosis prior to the takeoff of the OM1, and the right coronary artery was occluded.  The saphenous vein graft to the PDA and posterolateral was patent, the saphenous vein graft to the obtuse marginal was patent, the LIMA to the LAD was atretic.  The EF was 40%)  CONGESTIVE HEART FAILURE, SYSTOLIC, CHRONIC (ICD-428.0)  Hx of VENTRICULAR TACHYCARDIA  (VTach storm w/ AICD) PERIPHERAL VASCULAR DISEASE (ICD-443.9) (PTA to right SFA for an ABI of 0.36) Bilat carotid disease w/ bruits and mild-mod smooth plaque, 0-39%  HYPERCHOLESTEROLEMIA (ICD-272.0) DIABETES MELLITUS (ICD-250.00) - on GLUCOVANCE 2.5/500 1/2 tab Bid Amiodarone intolerance DIVERTICULOSIS OF COLON (ICD-562.10) COLONIC POLYPS (ICD-211.3) RENAL INSUFFICIENCY (ICD-588.9) DEGENERATIVE JOINT DISEASE (ICD-715.90) ANXIETY (ICD-300.00) SEBORRHEIC DERMATITIS (ICD-690.10)  Past Surgical History: S/P CABG 2/03 Insertion of a single chamber defibrillator- Medtronic Maxima 8/06 DrKlein  Family History: Reviewed history from 01/18/2009  and no changes required.  Mother died of an MI at 5.  Father died of an MI at   57.  He had a younger brother who died of an MI in 40.      Social History: Reviewed history from 01/31/2009 and no changes required.   He lives in [place], [place], with his wife.  He has a 100-pack-year history of tobacco abuse,  quitting about 6 years ago.  He previously used alcohol heavily but quit  about 5 years ago.  He is not routinely exercising. 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     Review of Systems      See HPI       The patient complains of dyspnea on exertion.  The patient denies anorexia, fever, weight loss, weight gain, vision loss, decreased hearing, hoarseness, chest pain, syncope, peripheral edema, prolonged cough,  headaches, hemoptysis, abdominal pain, melena, hematochezia, severe indigestion/heartburn, hematuria, incontinence, muscle weakness, suspicious skin lesions, transient blindness, difficulty walking, depression, unusual weight change, abnormal bleeding, enlarged lymph nodes, and angioedema.    Vital Signs:  Patient profile:   75 year old male Height:      66.5 inches Weight:      175.25 pounds O2 Sat:      94 % on Room air Temp:     98.1 degrees F oral Pulse rate:   58 / minute BP sitting:   100 / 60  (right arm) Cuff size:   regular  Vitals Entered By: Randell Loop CMA (August 30, 2009 11:35 AM)  O2 Sat at Rest %:  94 O2 Flow:  Room air CC: 3 month ROV & review of mult medical problems... Is Patient Diabetic? Yes Pain Assessment Patient in pain? no      Comments meds updated today   Physical Exam  Additional Exam:  WD, WN, 75 y/o WM in NAD... GENERAL:  Alert & oriented; pleasant & cooperative... HEENT:  [place]/AT, EOM-wnl, PERRLA, EACs-clear, TMs-wnl, NOSE-clear, THROAT-clear & wnl. NECK:  Supple w/ fairROM; no JVD; normal carotid impulses w/o bruits; no thyromegaly or nodules palpated; no lymphadenopathy. CHEST:  Few scat rhonchi without wheezing, rales, or signs of consolid... HEART:  Regular Rhythm;  gr1/6 SEM, without rubs or gallops heard... ABDOMEN:  Soft & nontender; normal bowel sounds; no organomegaly or masses detected. EXT: without deformities, mild arthritic changes; no varicose veins/ venous insuffic/ or edema. NEURO:  CN's intact; motor testing normal; sensory testing normal; gait normal & balance OK. DERM:  No lesions noted; no rash etc...    MISC. Report  Procedure date:  08/28/2009  Findings:      Lipid Panel (LIPID)   Cholesterol               131 mg/dL                   5-409   Triglycerides             139.0 mg/dL                 8.1-191.4   HDL                  [L]  78.29 mg/dL                 >56.21   LDL Cholesterol           70 mg/dL  0-99  BMP (METABOL)   Sodium                    141 mEq/L                   135-145   Potassium                 5.1 mEq/L                   3.5-5.1   Chloride                  102 mEq/L                   96-112   Carbon Dioxide       [H]  33 mEq/L                    19-32   Glucose              [L]  65 mg/dL                    16-10   BUN                       16 mg/dL                    9-60   Creatinine                1.4 mg/dL                   4.5-4.0   Calcium                   9.4 mg/dL                   9.8-11.9   GFR                       52.94 mL/min                >60   Hemoglobin A1C (A1C)   Hemoglobin A1C       [H]  6.9 %                       4.6-6.5   Impression & Recommendations:  Problem # 1:  COPD (ICD-496) His breathing is stable-  lungs improved clear x few scat rhonchi... continue Rx. His updated medication list for this problem includes:    Advair Diskus 500-50 Mcg/dose Misc (Fluticasone-salmeterol) ..... One inhalation two times a day...    Xopenex Hfa 45 Mcg/act Aero (Levalbuterol tartrate) .Marland Kitchen... As needed  Problem # 2:  CORONARY ARTERY DISEASE (ICD-414.00) He is s/p CABG in 2003 & stable, no angina, etc... he backed off on the Lasix to just Prn swelling due to low BPs...  His updated medication list for this problem includes:    Adprin B 325 Mg Tabs (Aspirin buf(cacarb-mgcarb-mgo)) .Marland Kitchen... Take 1 tablet by mouth once a day    Mexiletine Hcl 150 Mg Caps (Mexiletine hcl) .Marland Kitchen... Take one capsule two times a day as directed by drhochrein    Carvedilol 12.5 Mg Tabs (Carvedilol) .Marland Kitchen... Take 1 tablet by mouth two times a day    Enalapril Maleate 2.5 Mg Tabs (Enalapril maleate) ..... One bib    Furosemide 20 Mg Tabs (  Furosemide) .Marland Kitchen... Take 1 tab by mouth as needed...  Problem # 3:  CONGESTIVE HEART FAILURE, SYSTOLIC, CHRONIC (ICD-428.0) Appears well compensated on meds... His updated medication list for this problem includes:    Adprin B 325 Mg Tabs (Aspirin  buf(cacarb-mgcarb-mgo)) .Marland Kitchen... Take 1 tablet by mouth once a day    Carvedilol 12.5 Mg Tabs (Carvedilol) .Marland Kitchen... Take 1 tablet by mouth two times a day    Enalapril Maleate 2.5 Mg Tabs (Enalapril maleate) ..... One bib    Furosemide 20 Mg Tabs (Furosemide) .Marland Kitchen... Take 1 tab by mouth as needed...  Problem # 4:  Hx of VENTRICULAR TACHYCARDIA (ICD-427.1) Stable on these meds... His updated medication list for this problem includes:    Adprin B 325 Mg Tabs (Aspirin buf(cacarb-mgcarb-mgo)) .Marland Kitchen... Take 1 tablet by mouth once a day    Mexiletine Hcl 150 Mg Caps (Mexiletine hcl) .Marland Kitchen... Take one capsule two times a day as directed by drhochrein    Carvedilol 12.5 Mg Tabs (Carvedilol) .Marland Kitchen... Take 1 tablet by mouth two times a day  Problem # 5:  HYPERCHOLESTEROLEMIA (ICD-272.0) Improved on combination therapy.... continue same. His updated medication list for this problem includes:    Pravastatin Sodium 80 Mg Tabs (Pravastatin sodium) .Marland Kitchen... Take 1 tablet by mouth once a day    Fenofibrate 160 Mg Tabs (Fenofibrate) .Marland Kitchen... Take 1 tablet by mouth once a day  Problem # 6:  DIABETES MELLITUS (ICD-250.00) Good control-  try to elim nocturnal hypogly symptoms by taking PM Glucovance (1/2 tab) at dinner not bedtime...  His updated medication list for this problem includes:    Adprin B 325 Mg Tabs (Aspirin buf(cacarb-mgcarb-mgo)) .Marland Kitchen... Take 1 tablet by mouth once a day    Enalapril Maleate 2.5 Mg Tabs (Enalapril maleate) ..... One bib    Glyburide-metformin 2.5-500 Mg Tabs (Glyburide-metformin) .Marland Kitchen... Take 1/2 tab by mouth two times a day w/ breakfast & dinner...  Problem # 7:  DIVERTICULOSIS OF COLON (ICD-562.10) GI is stable...  Problem # 8:  RENAL INSUFFICIENCY (ICD-588.9) GU is stable...  Problem # 9:  OTHER MEDICAL PROBLEMS AS NOTED>>>  Complete Medication List: 1)  Advair Diskus 500-50 Mcg/dose Misc (Fluticasone-salmeterol) .... One inhalation two times a day... 2)  Xopenex Hfa 45 Mcg/act Aero  (Levalbuterol tartrate) .... As needed 3)  Mucinex 600 Mg Tb12 (Guaifenesin) .... Take 2 tabs twice daily w/ plenty of fluids.Marland KitchenMarland Kitchen 4)  Adprin B 325 Mg Tabs (Aspirin buf(cacarb-mgcarb-mgo)) .... Take 1 tablet by mouth once a day 5)  Mexiletine Hcl 150 Mg Caps (Mexiletine hcl) .... Take one capsule two times a day as directed by drhochrein 6)  Carvedilol 12.5 Mg Tabs (Carvedilol) .... Take 1 tablet by mouth two times a day 7)  Enalapril Maleate 2.5 Mg Tabs (Enalapril maleate) .... One bib 8)  Furosemide 20 Mg Tabs (Furosemide) .... Take 1 tab by mouth as needed... 9)  Pravastatin Sodium 80 Mg Tabs (Pravastatin sodium) .... Take 1 tablet by mouth once a day 10)  Fenofibrate 160 Mg Tabs (Fenofibrate) .... Take 1 tablet by mouth once a day 11)  Glyburide-metformin 2.5-500 Mg Tabs (Glyburide-metformin) .... Take 1/2 tab by mouth two times a day w/ breakfast & dinner...  Patient Instructions: 1)  Today we updated your med list- see below.... 2)  We decided to change the Furosemide to 1 tab as needed for swelling... 3)  As we discussed- take the Glucovance diabetic pill 1/2 tab twice daily at breakfast & dinner (don't take it at bedtime). 4)  Continue to be as active as poss... 5)  Call for any problems.Marland KitchenMarland Kitchen 6)  Please schedule a follow-up appointment in 6 months.

## 2010-06-25 NOTE — Assessment & Plan Note (Signed)
Summary: f43m per pt call/lg   Visit Type:  Follow-up Primary Provider:   Dr. Alroy Dust  CC:  CAD and Cardiomyopathy.  History of Present Illness: The patient presents for followup of his known coronary disease and cardiomyopathy. Since I last saw him he has had no new cardiovascular complaints. He doesn't exercise as much as outlined below he does golfing and a little bit of walking. With this he denies any chest discomfort, neck or arm discomfort. He has no palpitations, presyncope or syncope. He does have some dyspnea with exertion but this is baseline. He is not having any PND or orthopnea. He has no weight gain or edema. At her last visit I increased his enalapril and he tolerated this without problem.  Current Medications (verified): 1)  Advair Diskus 500-50 Mcg/dose  Misc (Fluticasone-Salmeterol) .... One Inhalation Two Times A Day... 2)  Xopenex Hfa 45 Mcg/act  Aero (Levalbuterol Tartrate) .... As Needed 3)  Mucinex 600 Mg  Tb12 (Guaifenesin) .... Take 2 Tabs Twice Daily W/ Plenty of Fluids.Marland KitchenMarland Kitchen 4)  Adprin B 325 Mg  Tabs (Aspirin Buf(Cacarb-Mgcarb-Mgo)) .... Take 1 Tablet By Mouth Once A Day 5)  Mexiletine Hcl 150 Mg  Caps (Mexiletine Hcl) .... Take One Capsule Two Times A Day As Directed By Drhochrein 6)  Carvedilol 12.5 Mg Tabs (Carvedilol) .... Take 1 Tablet By Mouth Two Times A Day 7)  Enalapril Maleate 2.5 Mg Tabs (Enalapril Maleate) .... One Bib 8)  Furosemide 20 Mg Tabs (Furosemide) .... Take 1 Tab By Mouth As Needed... 9)  Pravastatin Sodium 80 Mg  Tabs (Pravastatin Sodium) .... Take 1 Tablet By Mouth Once A Day 10)  Fenofibrate 160 Mg  Tabs (Fenofibrate) .... Take 1 Tablet By Mouth Once A Day 11)  Glyburide-Metformin 2.5-500 Mg  Tabs (Glyburide-Metformin) .... Take 1/2 Tab By Mouth Two Times A Day W/ Breakfast & Dinner...  Allergies (verified): 1)  ! Niaspan (Niacin (Antihyperlipidemic))  Past History:  Past Medical History: ASTHMATIC BRONCHITIS, ACUTE (ICD-466.0) COPD  (ICD-496) - ex-smoker, quit 2002... HYPERTENSION (ICD-401.9)   CORONARY ARTERY DISEASE (ICD-414.00)(last catheterization in October of 2008 demonstrated the left main had 40-50% stenosis, the LAD had minor irregularities, the circumflex had 80% stenosis prior to the takeoff of the OM1, and the right coronary artery was occluded.  The saphenous vein graft to the PDA and posterolateral was patent, the saphenous vein graft to the obtuse marginal was patent, the LIMA to the LAD was atretic.  The EF was 40%) CONGESTIVE HEART FAILURE, SYSTOLIC, CHRONIC (ICD-428.0)  Hx of VENTRICULAR TACHYCARDIA  (VTach storm w/ AICD) PERIPHERAL VASCULAR DISEASE (ICD-443.9) (PTA to right SFA for an ABI of 0.36) Bilat carotid disease w/ bruits and mild-mod smooth plaque, 0-39%  HYPERCHOLESTEROLEMIA (ICD-272.0) DIABETES MELLITUS (ICD-250.00) - on GLUCOVANCE 2.5/500 1/2 tab Bid Amiodarone intolerance DIVERTICULOSIS OF COLON (ICD-562.10) COLONIC POLYPS (ICD-211.3) RENAL INSUFFICIENCY (ICD-588.9) DEGENERATIVE JOINT DISEASE (ICD-715.90) ANXIETY (ICD-300.00) SEBORRHEIC DERMATITIS (ICD-690.10)  Past Surgical History: Reviewed history from 03/01/2010 and no changes required. S/P CABG 2/03 Insertion of a single chamber defibrillator- Medtronic Maxima 8/06 DrKlein  Review of Systems       As stated in the HPI and negative for all other systems.   Vital Signs:  Patient profile:   75 year old male Height:      66.5 inches Weight:      175 pounds BMI:     27.92 Pulse rate:   66 / minute Resp:     16 per minute BP sitting:  122 / 62  (right arm)  Vitals Entered By: Marrion Coy, CNA (March 22, 2010 2:04 PM)  Physical Exam  General:  Well developed, well nourished, in no acute distress. Head:  normocephalic and atraumatic Mouth:  Teeth, gums and palate normal. Oral mucosa normal. Neck:  Neck supple, no JVD. No masses, thyromegaly or abnormal cervical nodes. Chest Wall:  Well-healed ICD scar Lungs:   Clear bilaterally to auscultation and percussion. Heart:  Non-displaced PMI, chest non-tender; regular rate and rhythm, S1, S2 without murmurs, rubs or gallops. Carotid upstroke normal, no bruit. Normal abdominal aortic size, no bruits. Femorals normal pulses, no bruits. Pedals normal pulses. No edema, no varicosities. Abdomen:  Bowel sounds positive; abdomen soft and non-tender without masses, organomegaly, or hernias noted. No hepatosplenomegaly. Msk:  Back normal, normal gait. Muscle strength and tone normal. Pulses:  pulses normal in all 4 extremities Extremities:  No clubbing or cyanosis. Neurologic:  Alert and oriented x 3. Skin:  Intact without lesions or rashes. Cervical Nodes:  no significant adenopathy Inguinal Nodes:  no significant adenopathy Psych:  Normal affect.   EKG  Procedure date:  03/22/2010  Findings:      Sinus rhythm, interventricular conduction delay, right axis deviation   ICD Specifications Following MD:  Sherryl Manges, MD     Referring MD:  [facility] ICD Vendor:  Medtronic     ICD Model Number:  7232     ICD Serial Number:  [id number] ICD DOI:  12/27/2004     ICD Implanting MD:  Sherryl Manges, MD  Lead 1:    Location: RV     DOI: 12/27/2004     Model #: [id number]     Serial #: [id number]     Status: active  Indications::  VT   ICD Follow Up ICD Dependent:  No      Episodes Coumadin:  No  Brady Parameters Mode VVI     Lower Rate Limit:  40      Tachy Zones VF:  200     VT:  250 FVT VIA VF     VT1:  140     Impression & Recommendations:  Problem # 1:  CONGESTIVE HEART FAILURE, SYSTOLIC, CHRONIC (ICD-428.0) Today I will try to increase his enalapril to 5 mg b.i.d. He'll get a basic metabolic profile in 2 weeks.  Problem # 2:  HYPERTENSION (ICD-401.9) His blood pressure is treated in the context of managing his cardiomyopathy.  Problem # 3:  Hx of VENTRICULAR TACHYCARDIA (ICD-427.1) He has had no sustained episodes but has had 6 nonsustained events. He  will continue with current medications. Orders: EKG w/ Interpretation (93000)  Problem # 4:  CORONARY ARTERY DISEASE (ICD-414.00) He will continue with risk reduction. Orders: EKG w/ Interpretation (93000)  Patient Instructions: 1)  Your physician recommends that you schedule a follow-up appointment in: 08/2010 with Dr Antoine Poche 2)  Your physician has recommended you make the following change in your medication: Increase Enalapril to 5 mg twice a day Prescriptions: ENALAPRIL MALEATE 5 MG TABS (ENALAPRIL MALEATE) one two times a day  #60 x 11   Entered by:   Charolotte Capuchin, RN   Authorized by:   Rollene Rotunda, MD, [facility]   Signed by:   Charolotte Capuchin, RN on 03/22/2010   Method used:   Electronically to        [facility]  [phone]* (retail)       [address]       [place]  [place], [place]  [address]       Ph: [phone] or [phone]       Fax: [phone]   RxID:   [id number]  I have reviewed and approved all prescriptions at the time of this visit. Rollene Rotunda, MD, [facility]  March 22, 2010 2:43 PM

## 2010-06-25 NOTE — Progress Notes (Signed)
Summary: returning call  Phone Note Call from Patient Call back at Home Phone [phone]   Caller: Patient Reason for Call: Talk to Nurse Summary of Call: returning call Initial call taken by: Migdalia Dk,  May 29, 2009 4:00 PM  Follow-up for Phone Call        pt aware of orders and will return for lab 06/14/2009 Follow-up by: Charolotte Capuchin, RN,  May 30, 2009 10:22 AM

## 2010-06-25 NOTE — Assessment & Plan Note (Signed)
Summary: 3 month rov./sl   Visit Type:  Follow-up Primary Provider:   Dr. Alroy Dust  CC:  CAD.  History of Present Illness: The patient presents for follow up of his CAD and ischemic cardiomyopathy.  At the last appointment the patient had worsening shortness of breath but not evidence of volume overload.  I referred him back to Dr. Kriste Basque and he was treated for exacerbation of chronic lung disease. He had improvement with this. Since then he has done well. I have been slowly titrating his medications. He's had no new shortness of breath, PND or orthopnea. He said no new palpitations, presyncope or syncope. He's had no chest pressure, neck or arm discomfort. He's had no weight gain or swelling. He takes his diuretic about every other day.  Current Medications (verified): 1)  Advair Diskus 500-50 Mcg/dose  Misc (Fluticasone-Salmeterol) .... One Inhalation Two Times A Day... 2)  Xopenex Hfa 45 Mcg/act  Aero (Levalbuterol Tartrate) .... As Needed 3)  Mucinex 600 Mg  Tb12 (Guaifenesin) .... Take 2 Tabs Twice Daily W/ Plenty of Fluids.Marland KitchenMarland Kitchen 4)  Adprin B 325 Mg  Tabs (Aspirin Buf(Cacarb-Mgcarb-Mgo)) .... Take 1 Tablet By Mouth Once A Day 5)  Mexiletine Hcl 150 Mg  Caps (Mexiletine Hcl) .... Take One Capsule Two Times A Day As Directed By Drhochrein 6)  Carvedilol 12.5 Mg Tabs (Carvedilol) .... Take 1 Tablet By Mouth Two Times A Day 7)  Enalapril Maleate 2.5 Mg Tabs (Enalapril Maleate) .... One By Mouth Daily 8)  Furosemide 20 Mg Tabs (Furosemide) .... Take 1 Tab By Mouth Every Other Morning... 9)  Pravastatin Sodium 80 Mg  Tabs (Pravastatin Sodium) .... Take 1 Tablet By Mouth Once A Day 10)  Fenofibrate 160 Mg  Tabs (Fenofibrate) .... Take 1 Tablet By Mouth Once A Day 11)  Glyburide-Metformin 2.5-500 Mg  Tabs (Glyburide-Metformin) .... 1/2 Two Times A Day With Food 12)  Tussionex Pennkinetic Er 8-10 Mg/42ml Lqcr (Chlorpheniramine-Hydrocodone) .Marland Kitchen.. 1 Tsp Every 12 Hr As Needed Cough  Allergies  (verified): 1)  ! Niaspan (Niacin (Antihyperlipidemic))  Past History:  Past Medical History: ASTHMATIC BRONCHITIS, ACUTE (ICD-466.0) COPD (ICD-496) - ex-smoker, quit 2002... HYPERTENSION (ICD-401.9)   CORONARY ARTERY DISEASE (ICD-414.00)(last catheterization in October of 2008 demonstrated the left main had 40-50% stenosis, the LAD had minor irregularities, the circumflex had 80% stenosis prior to the takeoff of the OM1, and the right coronary artery was occluded.  The saphenous vein graft to the PDA and posterolateral was patent, the saphenous vein graft to the obtuse marginal was patent, the LIMA to the LAD was atretic.  The EF was 40%) CONGESTIVE HEART FAILURE, SYSTOLIC, CHRONIC (ICD-428.0)  Hx of VENTRICULAR TACHYCARDIA  (VTach storm w/ AICD) PERIPHERAL VASCULAR DISEASE (ICD-443.9) (PTA to right SFA for an ABI of 0.36) Bilat carotid disease w/ bruits and mild-mod smooth plaque, 0-39%  HYPERCHOLESTEROLEMIA (ICD-272.0) DIABETES MELLITUS (ICD-250.00) - on GLUCOVANCE 2.5/500 Bid until his bout w/ hypoglycemia  Amiodarone intolerance DIVERTICULOSIS OF COLON (ICD-562.10) COLONIC POLYPS (ICD-211.3) RENAL INSUFFICIENCY (ICD-588.9) DEGENERATIVE JOINT DISEASE (ICD-715.90) ANXIETY (ICD-300.00) SEBORRHEIC DERMATITIS (ICD-690.10)  Review of Systems       As stated in the HPI and negative for all other systems.   Vital Signs:  Patient profile:   75 year old male Height:      66.5 inches Weight:      170 pounds BMI:     27.13 Pulse rate:   62 / minute Resp:     16 per minute BP sitting:  102 / 66  (right arm)  Vitals Entered By: Marrion Coy, CNA (August 06, 2009 11:26 AM)  Physical Exam  General:  Well developed, well nourished, in no acute distress. Head:  normocephalic and atraumatic Eyes:  PERRLA/EOM intact; conjunctiva and lids normal. Mouth:  Upper dentures.  Oral mucosa normal. Neck:  Neck supple, no JVD. No masses, thyromegaly or abnormal cervical nodes. Chest  Wall:  well healed ICD pocket Lungs:  Clear bilaterally to auscultation and percussion. Abdomen:  Bowel sounds positive; abdomen soft and non-tender without masses, organomegaly, or hernias noted. No hepatosplenomegaly, obese Msk:  Back normal, normal gait. Muscle strength and tone normal. Extremities:  No clubbing or cyanosis. Neurologic:  Alert and oriented x 3. Skin:  Intact without lesions or rashes. Cervical Nodes:  no significant adenopathy Psych:  Normal affect.   Detailed Cardiovascular Exam  Neck    Carotids: Carotids full and equal bilaterally without bruits.      Neck Veins: Normal, no JVD.    Heart    Inspection: no deformities or lifts noted.      Palpation: normal PMI with no thrills palpable.      Auscultation: regular rate and rhythm, S1, S2 without murmurs, rubs, gallops, or clicks.    Vascular    Abdominal Aorta: no palpable masses, pulsations, or audible bruits.      Femoral Pulses: normal femoral pulses bilaterally.      Pedal Pulses: normal pedal pulses bilaterally.      Radial Pulses: normal radial pulses bilaterally.      Peripheral Circulation: no clubbing, cyanosis, or edema noted with normal capillary refill.     EKG  Procedure date:  08/06/2009  Findings:      sinus rhythm, rate 62, interventricular conduction delay   ICD Specifications Following MD:  Sherryl Manges, MD     Referring MD:  [facility] ICD Vendor:  [employer]     ICD Model Number:  7232     ICD Serial Number:  [id number] ICD DOI:  12/27/2004     ICD Implanting MD:  Sherryl Manges, MD  Lead 1:    Location: RV     DOI: 12/27/2004     Model #: [id number]     Serial #: [id number]     Status: active  Indications::  VT   ICD Follow Up ICD Dependent:  No      Episodes Coumadin:  No  Brady Parameters Mode VVI     Lower Rate Limit:  40      Tachy Zones VF:  200     VT:  250 FVT VIA VF     VT1:  140     Impression & Recommendations:  Problem # 1:  CHRONIC SYSTOLIC HEART FAILURE  (ICD-428.22) He seems to be euvolemic. However, I would still like to titrate his medicines just a bit more by increasing his enalapril to 2.5 mg b.i.d. He will get a basic metabolic profile to followup on this in 2 weeks.  Problem # 2:  HYPERTENSION (ICD-401.9) His blood pressure is being treated in the context of managing his cardiomyopathy.  Problem # 3:  CORONARY ARTERY DISEASE (ICD-414.00) He has no new symptoms and will continue with risk reduction. Orders: EKG w/ Interpretation (93000)  Problem # 4:  HYPERCHOLESTEROLEMIA (ICD-272.0) His lipids in September of last year demonstrated a low LDL. However, his HDL was also low. I have encouraged more exercise.  Patient Instructions: 1)  Your physician recommends that you schedule a follow-up appointment  in: 4 months with Dr Antoine Poche 2)  Your physician recommends that you return for lab work in: 2 weeks  basic metabolic panel 3)  Your physician has recommended you make the following change in your medication: Increase Enalapril to 2.5 mg one twice a day Prescriptions: ENALAPRIL MALEATE 2.5 MG TABS (ENALAPRIL MALEATE) one bib  #60 x 11   Entered by:   Charolotte Capuchin, RN   Authorized by:   Rollene Rotunda, MD, [facility]   Signed by:   Charolotte Capuchin, RN on 08/06/2009   Method used:   Electronically to        [facility]  [phone]* (retail)       [address]       [place], [place]  [address]       Ph: [phone] or [phone]       Fax: [phone]   RxID:   [id number]

## 2010-06-25 NOTE — Procedures (Signed)
Summary: [facility]   Current Medications (verified): 1)  Advair Diskus 500-50 Mcg/dose  Misc (Fluticasone-Salmeterol) .... One Inhalation Two Times A Day... 2)  Xopenex Hfa 45 Mcg/act  Aero (Levalbuterol Tartrate) .... As Needed 3)  Mucinex 600 Mg  Tb12 (Guaifenesin) .... Take 2 Tabs Twice Daily W/ Plenty of Fluids.Marland KitchenMarland Kitchen 4)  Adprin B 325 Mg  Tabs (Aspirin Buf(Cacarb-Mgcarb-Mgo)) .... Take 1 Tablet By Mouth Once A Day 5)  Mexiletine Hcl 150 Mg  Caps (Mexiletine Hcl) .... Take One Capsule Two Times A Day As Directed By Drhochrein 6)  Carvedilol 12.5 Mg Tabs (Carvedilol) .... Take 1 Tablet By Mouth Two Times A Day 7)  Enalapril Maleate 2.5 Mg Tabs (Enalapril Maleate) .... One Bib 8)  Furosemide 20 Mg Tabs (Furosemide) .... Take 1 Tab By Mouth Every Other Morning... 9)  Pravastatin Sodium 80 Mg  Tabs (Pravastatin Sodium) .... Take 1 Tablet By Mouth Once A Day 10)  Fenofibrate 160 Mg  Tabs (Fenofibrate) .... Take 1 Tablet By Mouth Once A Day 11)  Glyburide-Metformin 2.5-500 Mg  Tabs (Glyburide-Metformin) .... 1/2 Two Times A Day With Food 12)  Tussionex Pennkinetic Er 8-10 Mg/76ml Lqcr (Chlorpheniramine-Hydrocodone) .Marland Kitchen.. 1 Tsp Every 12 Hr As Needed Cough  Allergies (verified): 1)  ! Niaspan (Niacin (Antihyperlipidemic))   ICD Specifications Following MD:  Sherryl Manges, MD     Referring MD:  [facility] ICD Vendor:  [employer]     ICD Model Number:  7232     ICD Serial Number:  [id number] ICD DOI:  12/27/2004     ICD Implanting MD:  Sherryl Manges, MD  Lead 1:    Location: RV     DOI: 12/27/2004     Model #: [id number]     Serial #: [id number]     Status: active  Indications::  VT   ICD Follow Up Remote Check?  No Battery Voltage:  3.02 V     Charge Time:  8.27 seconds     Underlying rhythm:  SR ICD Dependent:  No       ICD Device Measurements Right Ventricle:  Amplitude: 7.0 mV, Impedance: 488 ohms, Threshold: 1.0 V at 0.3 msec Shock Impedance: 47/63 ohms   Episodes Coumadin:   No Shock:  0     ATP:  0     Nonsustained:  4     Ventricular Pacing:  <0.1%  Brady Parameters Mode VVI     Lower Rate Limit:  40      Tachy Zones VF:  200     VT:  250 FVT VIA VF     VT1:  140     Next Cardiology Appt Due:  10/24/2009 Tech Comments:  Normal device function.  1 episode of ST that fell into VT zone, device recognized appropriately because of onset and withheld therapy.  4 other episodes of NSVT, no EGM's avaiable.  6949 lead stable, SIC-0.  Pt with 3 areas of redness over device site.  1 at top of device site, 1 at bottom lateral edge, and 1 over device.  Pt states they have not always been there.  Red spots blanche when pressed.  Pt will monitor and call if they get larger or other areas of concern develop over device.  ROV 3 months clinic, 6 months SK. Gypsy Balsam RN BSN  August 06, 2009 2:17 PM

## 2010-06-25 NOTE — Progress Notes (Signed)
Summary: order  for labs  Phone Note Call from Patient   Caller: Patient Call For: nadel Summary of Call: order to check cholesterol and diabetes would like to come in tomorrow am Initial call taken by: Rickard Patience,  August 27, 2009 8:38 AM  Follow-up for Phone Call        pt would like to come in tomorrow to have bloodwork drawn before his appt on Thursday 08-30-2009.  Please advise if ok or not.  If ok, please advise what labs you want drawn and dx codes.  Thanks.  Aundra Millet Reynolds LPN  August 28, 4399 9:34 AM   Per SN: okay for lipid 272.0 and a1c 250.02. Boone Master CNA  August 27, 2009 10:59 AM   Additional Follow-up for Phone Call Additional follow up Details #1::        called and spoke with pt.  pt aware order for labs in computer for tomorrow.  Aundra Millet Reynolds LPN  August 28, 270 11:37 AM

## 2010-06-25 NOTE — Procedures (Signed)
Summary: Cardiology Device Clinic   Current Medications (verified): 1)  Advair Diskus 500-50 Mcg/dose  Misc (Fluticasone-Salmeterol) .... One Inhalation Two Times A Day... 2)  Xopenex Hfa 45 Mcg/act  Aero (Levalbuterol Tartrate) .... As Needed 3)  Mucinex 600 Mg  Tb12 (Guaifenesin) .... Take 2 Tabs Twice Daily W/ Plenty of Fluids.Marland KitchenMarland Kitchen 4)  Adprin B 325 Mg  Tabs (Aspirin Buf(Cacarb-Mgcarb-Mgo)) .... Take 1 Tablet By Mouth Once A Day 5)  Mexiletine Hcl 150 Mg  Caps (Mexiletine Hcl) .... Take One Capsule Two Times A Day As Directed By Drhochrein 6)  Carvedilol 12.5 Mg Tabs (Carvedilol) .... Take 1 Tablet By Mouth Two Times A Day 7)  Enalapril Maleate 2.5 Mg Tabs (Enalapril Maleate) .... One Bib 8)  Furosemide 20 Mg Tabs (Furosemide) .... Take 1 Tab By Mouth As Needed... 9)  Pravastatin Sodium 80 Mg  Tabs (Pravastatin Sodium) .... Take 1 Tablet By Mouth Once A Day 10)  Fenofibrate 160 Mg  Tabs (Fenofibrate) .... Take 1 Tablet By Mouth Once A Day 11)  Glyburide-Metformin 2.5-500 Mg  Tabs (Glyburide-Metformin) .... Take 1/2 Tab By Mouth Two Times A Day W/ Breakfast & Dinner...  Allergies (verified): 1)  ! Niaspan (Niacin (Antihyperlipidemic))   ICD Specifications Following MD:  Sherryl Manges, MD     Referring MD:  [facility] ICD Vendor:  [employer]     ICD Model Number:  7232     ICD Serial Number:  [id number] ICD DOI:  12/27/2004     ICD Implanting MD:  Sherryl Manges, MD  Lead 1:    Location: RV     DOI: 12/27/2004     Model #: [id number]     Serial #: [id number]     Status: active  Indications::  VT   ICD Follow Up Battery Voltage:  3.00 V     Charge Time:  8.40 seconds     Underlying rhythm:  SR ICD Dependent:  No       ICD Device Measurements Right Ventricle:  Amplitude: 6.6 mV, Impedance: 520 ohms, Threshold: 1.0 V at 0.2 msec Shock Impedance: 58/78 ohms   Episodes MS Episodes:  0     Coumadin:  No Shock:  0     ATP:  0     Nonsustained:  6     Atrial Therapies:  0 Ventricular Pacing:   <0.1%  Brady Parameters Mode VVI     Lower Rate Limit:  40      Tachy Zones VF:  200     VT:  250 FVT VIA VF     VT1:  140     Tech Comments:  6 NST EPISODES. NORMAL DEVICE FUNCTION.  NO CHANGES MADE. [id number]  W/SK. Vella Kohler

## 2010-06-25 NOTE — Assessment & Plan Note (Signed)
Summary: 70m reck/klw   Primary Care Allaina Brotzman:   Dr. Alroy Dust  CC:  6 month ROV & review of mult medical problems....  History of Present Illness: 75 y/o WM here for a follow up visit... he has multiple medical problems as noted below...    ~  Sep10:  I last saw him 9/09 for routine f/u visit- stable on his regular dosing of Advair500, Mucinex, & Xopenex... he missed several f/u appts and in the interim stopped his Metoprolol, Vasotec, ?others... he has followed up w/ DrHochrein & DrKlein- doing well, he says, & DrH restarted BBlocker Coreg & Enalapril Rx... he states that he's been regular w/ his Glucovance (2.5/500- 1/2 Bid) although he hasn't checked his BS recently... he also states that he's been taking his Prav80 + Fenofib160 daily (overdue for fasting labs)... I called [facility] on [address]- they confirmed regular refills recently.   ~  Jan11:  he had a bronchitic exac rx'd w/ Avelox, Pred, Mucinex, Tussionex & improved... discussed the need to continue the AdvairBid regularly + the Mucinex Bid, etc... DrHochrein recently incr the Coreg to 12.5Bid & decr the Lasix to 20mg  Qod due to low BP... he has mod seb derm- Rx w/ Selsun, T-gel & Lotrisone cream...   ~  August 30, 2009:  he reports a good 7mo- no new complaints or concerns... states he's not taking Lasix regularly, just Prn for swelling since BP is low... he notes occas nocturnal hypoglycenic episodes but has been taking the PM Glucovance Qhs> we discussed changing this to dinnertime dose... no CP, palpit/ resp exacerbations, etc...   ~  March 01, 2010:  he has one AB exac 7/11- seen by TP & Rx w/ Avelox, Pred, etc... otherw doing fine- no new complaints or concerns- he's retired & living at [facility] 1/2 the time, playing golf etc... BP controlled;  no CP, palpit, ch in SOB, etc;  due for f/u Appt w/ DrHochrein & CDopplers soon; he is not fasting this AM & will call us when he is ready to do his fasting labs> notes occas low  BS if he skips lunch...   Current Problem List:  ASTHMATIC BRONCHITIS, ACUTE (ICD-466.0) - occas acute infectious exacerbations...  ~  12/10: had bronchitic exac Rx'd w/ Avelox, Pred, Mucinex, etc... improved.  ~  7/11:  similar episode, responded to similar meds...  COPD (ICD-496) - ex-smoker, quit 2002... he clearly does better on regular dosing of ADVAIR 500Bid, MUCINEX 1200mg Bid, XOPENEX Prn... urged to continue these regularly.  HYPERTENSION (ICD-401.9) - meds for BP & his Chr CHF= COREG 12.5mg Bid,  ENALAPRIL 2.5mg /d, & LASIX 20mg  Qod (he's only taking this Prn swelling)...  BP=122/68 today, and feeling well- he denies HA, fatigue, visual changes, CP, palipit, dizziness, syncope, edema, etc...  ~  labs 1/10 showed K= 5.6, BUN= 31, Creat= 1.9..Marland Kitchen ACE/ ARB stopped... pt didn't follow up.  ~  8/10: Coreg + Enalapril restarted by DrHochrein...  ~  labs 9/10 showed K=5.6, BUN=19, Creat=1.5  ~  labs 12/10 showed K=4.4, BUN= 32, Creat= 1.7, BNP= 102  ~  labs 4/11 showed K=5.1, BUN=16, Creat=1.4  ~  labs 10/11 = pending.  CORONARY ARTERY DISEASE (ICD-414.00) - no chest pains, no palpit, no defib discharges...  ~ Hx 3 vessel CAD w/ CABG x4 in 2/03 for LMain dis & 3vessel dis + mod LVD EF=35% at that time...  ~ Cath 7/06 w/ non-filling of SVG to PDA, & LIMA to LAD was atretic, w/  EF=45%...  ~ Cath 10/08 w/ norm SVG to PDA, & atretic LIMA to LAD (due to good native vessel flow), EF=40%... note is made of a 40-50% LMain lesion...  CONGESTIVE HEART FAILURE, SYSTOLIC, CHRONIC (ICD-428.0) - ischemic cardiomyopathy & meds as above... followed by DrHochrein in the CHF clinic...  see labs above>>  ~  seen 8/10 in CHF clinic- Coreg + Enalapril restarted, labs reviewed.  Hx of VENTRICULAR TACHYCARDIA (ICD-427.1) - prev documented medication non-compliance w/ Amio & Mexilitene... DrHochrein has adjusted his meds...  ~ hx VTach storm w/ AICD placed, followed by DrTaylor/Klein on meds...  ~ DC'd 10/08 on  AMIODARONE 200mg - 2 tabs twice daily, and MEXILETINE 150mg Bid... subseq decr AMIO 200mg Bid...  ~ f/u w/ DrKlein 9/09 w/ defib check- doing well...  ~  f/u 9/10 w/ DrKlein on Mexilitene alone, Amio stopped for intolerance... doing well- no changes made.  PERIPHERAL VASCULAR DISEASE (ICD-443.9)  ~ s/p PTA to right SFA for an ABI of 0.36...   ~  CDopplers 10/09 showed bilat carotid disease w/ bruits and mod plaque in bulbs, 0-39% bilat... no change.  HYPERCHOLESTEROLEMIA (ICD-272.0) - prev on Simvastatin 40mg /d, and changed to PRAVASTATIN 80mg /d per DrHochrein (due to interaction of Simva & Amio)... FENOFIBRATE 160mg /d added 8/09...  ~  FLP 8/08 on Simva40 showed TChol 136, TG 297, HDL 22, LDL 69- but he was ?not taking meds regularly? he freq forgets.  ~  FLP 8/09 on Prav80 showed TChol 124, TG 257, HDL 24, LDL 65... rec to add Fenofibrate 160/d...  ~  FLP 9/10 on Prav80+Feno160 showed TChol 137, TG 142, HDL 26, LDL 83... continue both meds.  ~  FLP 4/11 on Prav80+Feno160 showed TChol 131, TG 139, HDL 33, LDL 70  ~  FLP 10/11 = pending.  DIABETES MELLITUS (ICD-250.00) - on GLUCOVANCE 2.5/500- 1/2 Bid... he states his home BS checks have all been around 100 w/ occas nocturnal hypoglycemia> advised to take PM dose at dinner, not bedtime.  ~   prev BS=177, HgA1c=7.0 in Oct08...  ~  labs 2/09 showed BS=83, HgA1c=6.8  ~  labs 8/09 showed BS= 118, HgA1c= 7.3  ~  labs 9/10 showed BS= 90, A1c= 7.2  ~  labs 4/11 showed BS= 65, A1c= 6.9  ~  labs 10/11 = pending.  DIVERTICULOSIS OF COLON (ICD-562.10) - last colonoscopy was 10/02 by [facility] showing divertics only... prev polypectomy 1997 for tubular adenoma... overdue for f/u exam... COLONIC POLYPS (ICD-211.3)  RENAL INSUFFICIENCY (ICD-588.9) - Creat in the 1.6-1.7 range...  ~  labs 8/09 showed BUN= 14, Creat= 1.2  ~  labs 1/10 showed BUN= 31, Creat= 1.9 & ACE was held... f/u improved.  ~  labs 9/10 showed BUN= 19, Creat= 1.5  ~  labs 10/11 showed =  pending.  DEGENERATIVE JOINT DISEASE (ICD-715.90)  ANXIETY (ICD-300.00)  SEBORRHEIC DERMATITIS (ICD-690.10) - he has mod seb derm- discussed Rx w/ Selsun/ T-Gel/ Lotrisone Cream Prn...   Preventive Screening-Counseling & Management  Alcohol-Tobacco     Smoking Status: quit     Year Quit: 2002     Pack years: 40yrs, 1-2 ppd  Allergies: 1)  ! Niaspan (Niacin (Antihyperlipidemic))  Comments:  Nurse/Medical Assistant: The patient's medications and allergies were reviewed with the patient and were updated in the Medication and Allergy Lists.  Past History:  Past Medical History: ASTHMATIC BRONCHITIS, ACUTE (ICD-466.0) COPD (ICD-496) - ex-smoker, quit 2002... HYPERTENSION (ICD-401.9)    CORONARY ARTERY DISEASE (ICD-414.00)(last catheterization in October of 2008 demonstrated the left main had  40-50% stenosis, the LAD had minor irregularities, the circumflex had 80% stenosis prior to the takeoff of the OM1, and the right coronary artery was occluded.  The saphenous vein graft to the PDA and posterolateral was patent, the saphenous vein graft to the obtuse marginal was patent, the LIMA to the LAD was atretic.  The EF was 40%)  CONGESTIVE HEART FAILURE, SYSTOLIC, CHRONIC (ICD-428.0)  Hx of VENTRICULAR TACHYCARDIA  (VTach storm w/ AICD) PERIPHERAL VASCULAR DISEASE (ICD-443.9) (PTA to right SFA for an ABI of 0.36) Bilat carotid disease w/ bruits and mild-mod smooth plaque, 0-39%  HYPERCHOLESTEROLEMIA (ICD-272.0) DIABETES MELLITUS (ICD-250.00) - on GLUCOVANCE 2.5/500 1/2 tab Bid Amiodarone intolerance DIVERTICULOSIS OF COLON (ICD-562.10) COLONIC POLYPS (ICD-211.3) RENAL INSUFFICIENCY (ICD-588.9) DEGENERATIVE JOINT DISEASE (ICD-715.90) ANXIETY (ICD-300.00) SEBORRHEIC DERMATITIS (ICD-690.10)  Past Surgical History: S/P CABG 2/03 Insertion of a single chamber defibrillator- Medtronic Maxima 8/06 DrKlein  Family History: Reviewed history from 01/18/2009 and no changes  required.  Mother died of an MI at 53.  Father died of an MI at   25.  He had a younger brother who died of an MI in 40.      Social History: Reviewed history from 01/31/2009 and no changes required.   He lives in [place], [place], with his wife.  He has a 100-pack-year history of tobacco abuse,  quitting about 6 years ago.  He previously used alcohol heavily but quit  about 5 years ago.  He is not routinely exercising. 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    Review of Systems      See HPI       The patient complains of dyspnea on exertion.  The patient denies anorexia, fever, weight loss, weight gain, vision loss, decreased hearing, hoarseness, chest pain, syncope, peripheral edema, prolonged cough, headaches, hemoptysis, abdominal pain, melena, hematochezia, severe indigestion/heartburn, hematuria, incontinence, muscle weakness, suspicious skin lesions, transient blindness, difficulty walking, depression, unusual weight change, abnormal bleeding, enlarged lymph nodes, and angioedema.    Vital Signs:  Patient profile:   75 year old male Height:      66.5 inches Weight:      176.38 pounds O2 Sat:      97 % on Room air Temp:     98.1 degrees F oral Pulse rate:   61 / minute BP sitting:   122 / 68  (left arm) Cuff size:   regular  Vitals Entered By: Randell Loop CMA (March 01, 2010 10:56 AM)  O2 Sat at Rest %:  97 O2 Flow:  Room air CC: 6 month ROV & review of mult medical problems... Is Patient Diabetic? Yes Pain Assessment Patient in pain? no      Comments meds updated today with pt   Physical Exam  Additional Exam:  WD, WN, 75 y/o WM in NAD... GENERAL:  Alert & oriented; pleasant & cooperative... HEENT:  [place]/AT, EOM-wnl, PERRLA, EACs-clear, TMs-wnl, NOSE-clear, THROAT-clear & wnl. NECK:  Supple w/ fairROM; no JVD; normal carotid impulses w/o bruits; no thyromegaly or nodules palpated; no lymphadenopathy. CHEST:  Few scat rhonchi without wheezing, rales,  or signs of consolid... HEART:  Regular Rhythm;  gr1/6 SEM, without rubs or gallops heard... ABDOMEN:  Soft & nontender; normal bowel sounds; no organomegaly or masses detected. EXT: without deformities, mild arthritic changes; no varicose veins/ venous insuffic/ or edema. NEURO:  CN's intact; motor testing normal; sensory testing normal; gait normal & balance OK. DERM:  No lesions noted; no rash etc...    MISC. Report  Procedure date:  03/01/2010  Findings:      Pt is not fasting today & he will call us when he is ready for FASTING blood work...  SN   Impression & Recommendations:  Problem # 1:  COPD (ICD-496) Ex-smoker, quit 2ppd in 2002, stable on Advair, Xopenex, Mucinex... His updated medication list for this problem includes:    Advair Diskus 500-50 Mcg/dose Misc (Fluticasone-salmeterol) ..... One inhalation two times a day...    Xopenex Hfa 45 Mcg/act Aero (Levalbuterol tartrate) .Marland Kitchen... As needed  Problem # 2:  HYPERTENSION (ICD-401.9) BP controlled on Rx>  continue same... His updated medication list for this problem includes:    Carvedilol 12.5 Mg Tabs (Carvedilol) .Marland Kitchen... Take 1 tablet by mouth two times a day    Enalapril Maleate 2.5 Mg Tabs (Enalapril maleate) ..... One bib    Furosemide 20 Mg Tabs (Furosemide) .Marland Kitchen... Take 1 tab by mouth as needed...  Problem # 3:  CORONARY ARTERY DISEASE (ICD-414.00) Followed by DrHochrein w/ up-coming appt... stable, denies angina, continue same meds. His updated medication list for this problem includes:    Adprin B 325 Mg Tabs (Aspirin buf(cacarb-mgcarb-mgo)) .Marland Kitchen... Take 1 tablet by mouth once a day    Mexiletine Hcl 150 Mg Caps (Mexiletine hcl) .Marland Kitchen... Take one capsule two times a day as directed by drhochrein    Carvedilol 12.5 Mg Tabs (Carvedilol) .Marland Kitchen... Take 1 tablet by mouth two times a day    Enalapril Maleate 2.5 Mg Tabs (Enalapril maleate) ..... One bib    Furosemide 20 Mg Tabs (Furosemide) .Marland Kitchen... Take 1 tab by mouth as  needed...  Problem # 4:  Hx of VENTRICULAR TACHYCARDIA (ICD-427.1) No AICD discharges>  stable, continue meds. His updated medication list for this problem includes:    Adprin B 325 Mg Tabs (Aspirin buf(cacarb-mgcarb-mgo)) .Marland Kitchen... Take 1 tablet by mouth once a day    Mexiletine Hcl 150 Mg Caps (Mexiletine hcl) .Marland Kitchen... Take one capsule two times a day as directed by drhochrein    Carvedilol 12.5 Mg Tabs (Carvedilol) .Marland Kitchen... Take 1 tablet by mouth two times a day  Problem # 5:  PERIPHERAL VASCULAR DISEASE (ICD-443.9) CD[facility]lers are sched for later this mo at [facility], continue ASA Rx.  Problem # 6:  HYPERCHOLESTEROLEMIA (ICD-272.0) Due for f/u FLP on meds & he will ret soon for this... His updated medication list for this problem includes:    Pravastatin Sodium 80 Mg Tabs (Pravastatin sodium) .Marland Kitchen... Take 1 tablet by mouth once a day    Fenofibrate 160 Mg Tabs (Fenofibrate) .Marland Kitchen... Take 1 tablet by mouth once a day  Problem # 7:  DIABETES MELLITUS (ICD-250.00) Stable on 1/2 Glucovance Bid but needs to eat regularly... due for f/u A1c & he will ret... His updated medication list for this problem includes:    Adprin B 325 Mg Tabs (Aspirin buf(cacarb-mgcarb-mgo)) .Marland Kitchen... Take 1 tablet by mouth once a day    Enalapril Maleate 2.5 Mg Tabs (Enalapril maleate) ..... One bib    Glyburide-metformin 2.5-500 Mg Tabs (Glyburide-metformin) .Marland Kitchen... Take 1/2 tab by mouth two times a day w/ breakfast & dinner...  Problem # 8:  COLONIC POLYPS (ICD-211.3) He needs f/u w/ Med[facility] for colonoscopy & is encouraged to call...  Problem # 9:  OTHER MEDICAL PROBLEMS AS NOTED>>> OK FLu shot...  Complete Medication List: 1)  Advair Diskus 500-50 Mcg/dose Misc (Fluticasone-salmeterol) .... One inhalation two times a day... 2)  Xopenex Hfa 45 Mcg/act Aero (Levalbuterol tartrate) .... As needed  3)  Mucinex 600 Mg Tb12 (Guaifenesin) .... Take 2 tabs twice daily w/ plenty of fluids.Marland KitchenMarland Kitchen 4)  Adprin B 325 Mg Tabs (Aspirin  buf(cacarb-mgcarb-mgo)) .... Take 1 tablet by mouth once a day 5)  Mexiletine Hcl 150 Mg Caps (Mexiletine hcl) .... Take one capsule two times a day as directed by drhochrein 6)  Carvedilol 12.5 Mg Tabs (Carvedilol) .... Take 1 tablet by mouth two times a day 7)  Enalapril Maleate 2.5 Mg Tabs (Enalapril maleate) .... One bib 8)  Furosemide 20 Mg Tabs (Furosemide) .... Take 1 tab by mouth as needed... 9)  Pravastatin Sodium 80 Mg Tabs (Pravastatin sodium) .... Take 1 tablet by mouth once a day 10)  Fenofibrate 160 Mg Tabs (Fenofibrate) .... Take 1 tablet by mouth once a day 11)  Glyburide-metformin 2.5-500 Mg Tabs (Glyburide-metformin) .... Take 1/2 tab by mouth two times a day w/ breakfast & dinner...  Other Orders: Flu Vaccine 37yrs + MEDICARE PATIENTS (O1308) Administration Flu vaccine - MCR ([id number])  Patient Instructions: 1)  Today we updated your med list- see below.... 2)  Continue your current meds the same...  3)  Be sure to eat on a regular schedule & stay away from soft drinks, coke, etc... 4)  Today we gave you the 2011 Flu vaccine... 5)  Please call us at [phone] when you are ready to do your FASTING blood work & we will notify the lab... then call the "phone tree" in a few days for your lab results.Marland KitchenMarland Kitchen  6)  Please schedule a follow-up appointment in 6 months.    Flu Vaccine Consent Questions     Do you have a history of severe allergic reactions to this vaccine? no    Any prior history of allergic reactions to egg and/or gelatin? no    Do you have a sensitivity to the preservative Thimersol? no    Do you have a past history of Guillan-Barre Syndrome? no    Do you currently have an acute febrile illness? no    Have you ever had a severe reaction to latex? no    Vaccine information given and explained to patient? yes    Are you currently pregnant? no    Lot Number:[id number]   Exp Date:11/23/2010   Site Given  Left Deltoid IMdflu Armita Galloway RCP, LPN  March 01, 2010  12:00 PM

## 2010-06-25 NOTE — Assessment & Plan Note (Signed)
Summary: 3 week follow up/lwa   Primary Care Provider:   Dr. Alroy Dust  CC:  4 month ROV & review of mult medical problems....  History of Present Illness: 75 y/o [name] here for a follow up visit... he has multiple medical problems as noted below...    ~  January 31, 2009:  I last saw him 9/09 for routine f/u visit- stable on his regular dosing of Advair500, Mucinex, & Xopenex... he misses several f/u appts and in the interim stopped his Metoprolol, Vasotec, ?others... he has followed up w/ DrHochrein & DrKlein- doing well, he says, & DrH restarted BBlocker Coreg & Enalapril Rx... he states that he's been regular w/ his Glucovance (2.5/500- 1/2 Bid) although he hasn't checked his BS recently... he also states that he's been taking his Prav80 + Fenofib160 daily (overdue for fasting labs)... I called [facility] on [address] and checked refills- he's been getting regular refills recently.   ~  May 30, 2009:  he had a bronchitic exac rx'd w/ Avelox, Pred, Mucinex, Tussionex & improved... discussed the need to continue the AdvairBid regularly + the Mucinex Bid, etc... DrHochrein recently incr the Coreg to 12.5Bid & decr the Lasix to 20mg  Qod due to low BP... he has mod seb derm- Rx w/ Selsun, T-gel & Lotrisone cream...    Current Problem List:  ASTHMATIC BRONCHITIS, ACUTE (ICD-466.0) - occas acute infectious exacerbations...  ~  12/10: had bronchitic exac Rx'd w/ Avelox, Pred, Mucinex, etc... improved.  COPD (ICD-496) - ex-smoker, quit 2002... he clearly does better on regular dosing of ADVAIR 500Bid, MUCINEX 1200mg Bid, XOPENEX Prn... urged to continue these regularly.  HYPERTENSION (ICD-401.9) - meds for BP & his Chr CHF= now back on BBlocker COREG 12.5mg Bid,  ENALAPRIL 2.5mg /d, & LASIX 20mg  Qod...  BP=100/64 today, and feeling well- he denies HA, fatigue, visual changes, CP, palipit, dizziness, syncope, edema, etc...  ~  labs 1/10 showed K= 5.6, BUN= 31, Creat= 1.9..Marland Kitchen ACE/ ARB  stopped... pt didn't follow up.  ~  8/30: Coreg + Enalapril restarted by DrHochrein...  ~  labs 9/10 showed K=5.6, BUN=19, Creat=1.5  CORONARY ARTERY DISEASE (ICD-414.00) - no chest pains, no palpit, no defib discharges...  ~ Hx 3 vessel CAD w/ CABG x4 in 2/03 for LMain dis & 3vessel dis + mod LVD EF=35% at that time...  ~ Cath 7/06 w/ non-filling of SVG to PDA, & LIMA to LAD was atretic, w/ EF=45%...  ~ Cath 10/08 w/ norm SVG to PDA, & atretic LIMA to LAD (due to good native vessel flow), EF=40%... note is made of a 40-50% LMain lesion...  CONGESTIVE HEART FAILURE, SYSTOLIC, CHRONIC (ICD-428.0) - ischemic cardiomyopathy & meds as above... followed by DrHochrein in the CHF clinic...  ~  seen 8/10 in [facility]- Coreg + Enalapril restarted, labs reviewed.  ~  12/10: labs showed K=4.4, BUN= 32, Creat= 1.7, BNP= 102  Hx of VENTRICULAR TACHYCARDIA (ICD-427.1) - prev documented medication non-compliance w/ Amio & Mexilitene... DrHochrein has adjusted his meds...  ~ hx VTach storm w/ AICD placed, followed by DrTaylor/Klein on meds...  ~ DC'd 10/08 on AMIODARONE 200mg - 2 tabs twice daily, and MEXILETINE 150mg Bid... subseq decr AMIO 200mg Bid...  ~ f/u w/ DrKlein 9/09 w/ defib check- doing well...  ~  f/u 9/10 w/ DrKlein on Mexilitene alone, Amio stopped for intolerance... doing well- no changes made.  PERIPHERAL VASCULAR DISEASE (ICD-443.9)  ~ s/p PTA to right SFA for an ABI of 0.36...   ~  CDopplers 10/09  showed bilat carotid disease w/ bruits and mod plaque in bulbs, 0-39% bilat... no change.  HYPERCHOLESTEROLEMIA (ICD-272.0) - prev on Simvastatin 40mg /d, and changed to PRAVASTATIN 80mg /d per DrHochrein (due to interaction of Simva & Amio)... FENOFIBRATE 160mg /d added 8/09...  ~  FLP 8/08 on Simva40 showed TChol 136, TG 297, HDL 22, LDL 69- but he was ?not taking meds regularly? he freq forgets.  ~  FLP 8/09 on Prav80 showed TChol 124, TG 257, HDL 24, LDL 65... rec to add Fenofibrate 160/d...  ~   FLP 9/10 on Prav80 showed TChol 137, TG 142, HDL 26, LDL 83... continue both meds.  DIABETES MELLITUS (ICD-250.00) - on GLUCOVANCE 2.5/500- 1/2 Bid... he states his home BS checks have all been around 100 w/ no hypogly reactions on this dose...  ~   prev BS=177, HgA1c=7.0 in Oct08...  ~  labs 07/06/07 showed BS=83, HgA1c=6.8.Marland Kitchen.  ~  labs 8/09 showed BS= 118, HgA1c= 7.3.Marland KitchenMarland Kitchen rec to incr AM 1tab, PM 1/2tab (he never did this).  ~  labs 9/10 showed BS= 90, A1c= 7.2  DIVERTICULOSIS OF COLON (ICD-562.10) - last colonoscopy was 10/02 by [facility] showing divertics only... prev polypectomy 1997 for tubular adenoma... overdue for f/u exam... COLONIC POLYPS (ICD-211.3)  RENAL INSUFFICIENCY (ICD-588.9) - Creat in the 1.6-1.7 range...  ~  labs 8/09 showed BUN= 14, Creat= 1.2  ~  labs 1/10 showed BUN= 31, Creat= 1.9 & ACE was held... f/u improved.  ~  labs 9/10 showed BUN= 19, Creat= 1.5  DEGENERATIVE JOINT DISEASE (ICD-715.90)  ANXIETY (ICD-300.00)  SEBORRHEIC DERMATITIS (ICD-690.10) - he has mod seb derm- discussed Rx w/ Selsun/ T-Gel/ Lotrisone Cream Prn...    Allergies: 1)  ! Niaspan (Niacin (Antihyperlipidemic))  Comments:  Nurse/Medical Assistant: The patient's medications and allergies were reviewed with the patient and were updated in the Medication and Allergy Lists.  Past History:  Past Medical History: ASTHMATIC BRONCHITIS, ACUTE (ICD-466.0) COPD (ICD-496) - ex-smoker, quit 2002... HYPERTENSION (ICD-401.9)   CORONARY ARTERY DISEASE (ICD-414.00)(last catheterization in October of 2008 demonstrated the left main had 40-50% stenosis, the LAD had minor irregularities, the circumflex had 80% stenosis prior to the takeoff of the OM1, and the right coronary artery was occluded.  The saphenous vein graft to the PDA and posterolateral was patent, the saphenous vein graft to the obtuse marginal was patent, the LIMA to the LAD was atretic.  The EF was 40%) CONGESTIVE HEART FAILURE,  SYSTOLIC, CHRONIC (ICD-428.0)  Hx of VENTRICULAR TACHYCARDIA  (VTach storm w/ AICD) PERIPHERAL VASCULAR DISEASE (ICD-443.9) (PTA to right SFA for an ABI of 0.36) Bilat carotid disease w/ bruits and mild-mod smooth plaque, 0-39%  HYPERCHOLESTEROLEMIA (ICD-272.0) DIABETES MELLITUS (ICD-250.00) - on GLUCOVANCE 2.5/500 Bid until his bout w/ hypoglycemia  Renal Insufficiency Amiodarone intolerance  DIVERTICULOSIS OF COLON (ICD-562.10) COLONIC POLYPS (ICD-211.3) RENAL INSUFFICIENCY (ICD-588.9) DEGENERATIVE JOINT DISEASE (ICD-715.90) ANXIETY (ICD-300.00) SEBORRHEIC DERMATITIS (ICD-690.10)  Past Surgical History: S/P CABG 2/03 Insertion of a single chamber defibrillator- Medtronic Maxima 8/06 DrKlein  Family History: Reviewed history from 01/18/2009 and no changes required.  Mother died of an MI at 59.  Father died of an MI at   61.  He had a younger brother who died of an MI in 31.      Social History: Reviewed history from 01/31/2009 and no changes required.   He lives in [place], [place], with his wife.  He has a 100-pack-year history of tobacco abuse,  quitting about 6 years ago.  He previously used alcohol heavily  but quit  about 5 years ago.  He is not routinely exercising. 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     Review of Systems      See HPI       The patient complains of dyspnea on exertion.  The patient denies anorexia, fever, weight loss, weight gain, vision loss, decreased hearing, hoarseness, chest pain, syncope, peripheral edema, prolonged cough, headaches, hemoptysis, abdominal pain, melena, hematochezia, severe indigestion/heartburn, hematuria, incontinence, muscle weakness, suspicious skin lesions, transient blindness, difficulty walking, depression, unusual weight change, abnormal bleeding, enlarged lymph nodes, and angioedema.    Vital Signs:  Patient profile:   75 year old male Height:      66.5 inches Weight:      174.50 pounds BMI:     27.84 O2  Sat:      96 % on Room air Temp:     96.7 degrees F oral Pulse rate:   58 / minute BP sitting:   100 / 64  (right arm) Cuff size:   regular  Vitals Entered By: Randell Loop CMA (May 30, 2009 3:13 PM)  O2 Sat at Rest %:  96 O2 Flow:  Room air CC: 4 month ROV & review of mult medical problems... Comments meds updated   Physical Exam  Additional Exam:  WD, WN, 75 y/o [name] in NAD... GENERAL:  Alert & oriented; pleasant & cooperative... HEENT:  [place]/AT, EOM-wnl, PERRLA, EACs-clear, TMs-wnl, NOSE-clear, THROAT-clear & wnl. NECK:  Supple w/ fairROM; no JVD; normal carotid impulses w/o bruits; no thyromegaly or nodules palpated; no lymphadenopathy. CHEST:  Few scat rhonchi without wheezing, rales, or signs of consolid... HEART:  Regular Rhythm;  gr1/6 SEM, without rubs or gallops heard... ABDOMEN:  Soft & nontender; normal bowel sounds; no organomegaly or masses detected. EXT: without deformities, mild arthritic changes; no varicose veins/ venous insuffic/ or edema. NEURO:  CN's intact; motor testing normal; sensory testing normal; gait normal & balance OK. DERM:  No lesions noted; no rash etc...     Impression & Recommendations:  Problem # 1:  ASTHMATIC BRONCHITIS, ACUTE (ICD-466.0) Improved after Avelox/ Pred... continue the Advair Bid + Mucinex, Fluids, Tussionex Prn. The following medications were removed from the medication list:    Avelox 400 Mg Tabs (Moxifloxacin hcl) .Marland Kitchen... 1 by mouth once daily His updated medication list for this problem includes:    Advair Diskus 500-50 Mcg/dose Misc (Fluticasone-salmeterol) ..... One inhalation two times a day...    Xopenex Hfa 45 Mcg/act Aero (Levalbuterol tartrate) .Marland Kitchen... As needed    Mucinex 600 Mg Tb12 (Guaifenesin) .Marland Kitchen... Take 2 tabs twice daily w/ plenty of fluids...    Tussionex Pennkinetic Er 8-10 Mg/12ml Lqcr (Chlorpheniramine-hydrocodone) .Marland Kitchen... 1 tsp every 12 hr as needed cough  Problem # 2:  HYPERTENSION (ICD-401.9) BP tending  low on the BBlocker/ ACE/ Diuretic regimen... His updated medication list for this problem includes:    Carvedilol 12.5 Mg Tabs (Carvedilol) .Marland Kitchen... Take 1 tablet by mouth two times a day    Enalapril Maleate 2.5 Mg Tabs (Enalapril maleate) ..... One by mouth daily    Furosemide 20 Mg Tabs (Furosemide) .Marland Kitchen... Take 1 tab by mouth every other morning...  Problem # 3:  CONGESTIVE HEART FAILURE, SYSTOLIC, CHRONIC (ICD-428.0) Continue meds per DrHochrein... His updated medication list for this problem includes:    Adprin B 325 Mg Tabs (Aspirin buf(cacarb-mgcarb-mgo)) .Marland Kitchen... Take 1 tablet by mouth once a day    Carvedilol 12.5 Mg Tabs (Carvedilol) .Marland Kitchen... Take 1 tablet by  mouth two times a day    Enalapril Maleate 2.5 Mg Tabs (Enalapril maleate) ..... One by mouth daily    Furosemide 20 Mg Tabs (Furosemide) .Marland Kitchen... Take 1 tab by mouth every other morning...  Problem # 4:  HYPERCHOLESTEROLEMIA (ICD-272.0) We will f/u in 2mo w/ FLP... His updated medication list for this problem includes:    Pravastatin Sodium 80 Mg Tabs (Pravastatin sodium) .Marland Kitchen... Take 1 tablet by mouth once a day    Fenofibrate 160 Mg Tabs (Fenofibrate) .Marland Kitchen... Take 1 tablet by mouth once a day  Problem # 5:  DIABETES MELLITUS (ICD-250.00) Sugars have been doing satis... continue Rx. His updated medication list for this problem includes:    Adprin B 325 Mg Tabs (Aspirin buf(cacarb-mgcarb-mgo)) .Marland Kitchen... Take 1 tablet by mouth once a day    Enalapril Maleate 2.5 Mg Tabs (Enalapril maleate) ..... One by mouth daily    Glyburide-metformin 2.5-500 Mg Tabs (Glyburide-metformin) .Marland Kitchen... 1/2 two times a day with food  Problem # 6:  DIVERTICULOSIS OF COLON (ICD-562.10) GI is stable...  Problem # 7:  RENAL INSUFFICIENCY (ICD-588.9) Renal is stable...  Problem # 8:  OTHER MEDICAL PROBLEMS AS NOTED>>>  Complete Medication List: 1)  Advair Diskus 500-50 Mcg/dose Misc (Fluticasone-salmeterol) .... One inhalation two times a day... 2)  Xopenex Hfa  45 Mcg/act Aero (Levalbuterol tartrate) .... As needed 3)  Mucinex 600 Mg Tb12 (Guaifenesin) .... Take 2 tabs twice daily w/ plenty of fluids.Marland KitchenMarland Kitchen 4)  Adprin B 325 Mg Tabs (Aspirin buf(cacarb-mgcarb-mgo)) .... Take 1 tablet by mouth once a day 5)  Mexiletine Hcl 150 Mg Caps (Mexiletine hcl) .... Take one capsule two times a day as directed by drhochrein 6)  Carvedilol 12.5 Mg Tabs (Carvedilol) .... Take 1 tablet by mouth two times a day 7)  Enalapril Maleate 2.5 Mg Tabs (Enalapril maleate) .... One by mouth daily 8)  Furosemide 20 Mg Tabs (Furosemide) .... Take 1 tab by mouth every other morning... 9)  Pravastatin Sodium 80 Mg Tabs (Pravastatin sodium) .... Take 1 tablet by mouth once a day 10)  Fenofibrate 160 Mg Tabs (Fenofibrate) .... Take 1 tablet by mouth once a day 11)  Glyburide-metformin 2.5-500 Mg Tabs (Glyburide-metformin) .... 1/2 two times a day with food 12)  Tussionex Pennkinetic Er 8-10 Mg/72ml Lqcr (Chlorpheniramine-hydrocodone) .Marland Kitchen.. 1 tsp every 12 hr as needed cough

## 2010-06-25 NOTE — Miscellaneous (Signed)
Summary: Orders Update  Clinical Lists Changes  Problems: Added new problem of CAROTID ARTERY DISEASE (ICD-433.10) Orders: Added new Test order of Carotid Duplex (Carotid Duplex) - Signed 

## 2010-06-25 NOTE — Progress Notes (Signed)
Summary: REFILL  Phone Note Call from Patient   Caller: Patient Call For: NADEL Summary of Call: NEED ADVAIR 500/50 PRESCRIPT REFILLED [facility] [address] Initial call taken by: Rickard Patience,  February 26, 2010 11:31 AM  Follow-up for Phone Call        Pt aware that RX has been sent and aware of appt on 03-01-10 at 11am with SN.Reynaldo Minium CMA  February 26, 2010 11:41 AM     Prescriptions: ADVAIR DISKUS 500-50 MCG/DOSE  MISC (FLUTICASONE-SALMETEROL) one inhalation two times a day...  #1 x 0   Entered by:   Reynaldo Minium CMA   Authorized by:   Michele Mcalpine MD   Signed by:   Reynaldo Minium CMA on 02/26/2010   Method used:   Electronically to        [facility]  [phone]* (retail)       [address]       [place], [place]  [address]       Ph: [phone] or [phone]       Fax: [phone]   RxID:   [id number]

## 2010-06-25 NOTE — Assessment & Plan Note (Signed)
Summary: 75 YEAR ROV   Visit Type:  1 yr f/u Primary Provider:   Dr. Alroy Dust  CC:  sob w/walking...denies any other complaints today.  History of Present Illness: [name] is seen in followup for ventricular tachycardia with a prior history of storm for which he previously took amiodarone but now takes mexiletine. Amiodarone was discontinued because of intolerance.  This occurs in the context of ischemic heart disease with prior bypass surgery.  He and his wife both say that he is doing quite well. He has some shortness of breath with exertion even less than about 200 yards.   Current Medications (verified): 1)  Advair Diskus 500-50 Mcg/dose  Misc (Fluticasone-Salmeterol) .... One Inhalation Two Times A Day... 2)  Xopenex Hfa 45 Mcg/act  Aero (Levalbuterol Tartrate) .... As Needed 3)  Mucinex 600 Mg  Tb12 (Guaifenesin) .... Take 2 Tabs Twice Daily W/ Plenty of Fluids.Marland KitchenMarland Kitchen 4)  Aspirin Ec 325 Mg Tbec (Aspirin) .... Take One Tablet By Mouth Daily 5)  Mexiletine Hcl 150 Mg  Caps (Mexiletine Hcl) .... Take One Capsule Two Times A Day As Directed By Drhochrein 6)  Carvedilol 12.5 Mg Tabs (Carvedilol) .... Take 1 Tablet By Mouth Two Times A Day 7)  Furosemide 20 Mg Tabs (Furosemide) .... Take 1 Tab By Mouth As Needed... 8)  Pravastatin Sodium 80 Mg  Tabs (Pravastatin Sodium) .... Take 1 Tablet By Mouth Once A Day 9)  Fenofibrate 160 Mg  Tabs (Fenofibrate) .... Take 1 Tablet By Mouth Once A Day 10)  Glyburide-Metformin 2.5-500 Mg  Tabs (Glyburide-Metformin) .... Take 1/2 Tab By Mouth Two Times A Day W/ Breakfast & Dinner... 11)  Enalapril Maleate 5 Mg Tabs (Enalapril Maleate) .... One Two Times A Day  Allergies: 1)  ! Niaspan (Niacin (Antihyperlipidemic))  Vital Signs:  Patient profile:   75 year old male Height:      66.5 inches Weight:      175.12 pounds BMI:     27.94 Pulse rate:   64 / minute Pulse rhythm:   regular BP sitting:   118 / 72  (left arm) Cuff size:    large  Vitals Entered By: Danielle Rankin, CMA (April 17, 2010 3:14 PM)  Physical Exam  General:  The patient was alert and oriented in no acute distress. HEENT Normal.  Neck veins were flat, carotids were brisk.  Lungs were clear.  Heart sounds were regular without murmurs or gallops.  Abdomen was soft with active bowel sounds. There is no clubbing cyanosis or edema. Skin Warm and dry     ICD Specifications Following MD:  Sherryl Manges, MD     Referring MD:  [facility] ICD Vendor:  [employer]     ICD Model Number:  7232     ICD Serial Number:  [id number] ICD DOI:  12/27/2004     ICD Implanting MD:  Sherryl Manges, MD  Lead 1:    Location: RV     DOI: 12/27/2004     Model #: [id number]     Serial #: [id number]     Status: active  Indications::  VT   ICD Follow Up Battery Voltage:  3.00 V     Charge Time:  8.40 seconds     Underlying rhythm:  SR ICD Dependent:  No       ICD Device Measurements Right Ventricle:  Amplitude: 9.4 mV, Impedance: 520 ohms, Threshold: 1.0 V at 0.2 msec Shock Impedance: 61/86 ohms   Episodes MS Episodes:  0  Coumadin:  No Shock:  0     ATP:  0     Nonsustained:  4     Ventricular Pacing:  <0.1%  Brady Parameters Mode VVI     Lower Rate Limit:  40      Tachy Zones VF:  200     VT:  250 FVT VIA VF     VT1:  140     Next Cardiology Appt Due:  06/26/2010 Tech Comments:  4 NST EPISODES AND 1 SVT EPISODE LASTING 14 SECONDS.  NORMAL DEVICE FUNCTION.  NO CHANGES MADE. ROV IN 3 MTHS W/DEVICE CLINIC. Vella Kohler  April 17, 2010 3:51 PM  Impression & Recommendations:  Problem # 1:  CHRONIC SYSTOLIC HEART FAILURE (ICD-428.22) stable on current meds: we discussed the importance of exercise and came up with a protocol  His updated medication list for this problem includes:    Aspirin Ec 325 Mg Tbec (Aspirin) .Marland Kitchen... Take one tablet by mouth daily    Mexiletine Hcl 150 Mg Caps (Mexiletine hcl) .Marland Kitchen... Take one capsule two times a day as directed by drhochrein     Carvedilol 12.5 Mg Tabs (Carvedilol) .Marland Kitchen... Take 1 tablet by mouth two times a day    Furosemide 20 Mg Tabs (Furosemide) .Marland Kitchen... Take 1 tab by mouth as needed...    Enalapril Maleate 5 Mg Tabs (Enalapril maleate) ..... One two times a day  Problem # 2:  Hx of VENTRICULAR TACHYCARDIA (ICD-427.1) non intercurrent vt His updated medication list for this problem includes:    Aspirin Ec 325 Mg Tbec (Aspirin) .Marland Kitchen... Take one tablet by mouth daily    Mexiletine Hcl 150 Mg Caps (Mexiletine hcl) .Marland Kitchen... Take one capsule two times a day as directed by drhochrein    Carvedilol 12.5 Mg Tabs (Carvedilol) .Marland Kitchen... Take 1 tablet by mouth two times a day    Enalapril Maleate 5 Mg Tabs (Enalapril maleate) ..... One two times a day  Problem # 3:  AUTOMATIC IMPLANTABLE CARDIAC DEFIBRILLATOR SITU (ICD-V45.02) Device parameters and data were reviewed and no changes were made  Patient Instructions: 1)  Your physician recommends that you schedule a follow-up appointment in: 3 months with Device Clinic. 2)  Your physician recommends that you continue on your current medications as directed. Please refer to the Current Medication list given to you today. 3)  Your physician wants you to follow-up in:  6 months with Dr. [place] Lions. You will receive a reminder letter in the mail two months in advance. If you don't receive a letter, please call our office to schedule the follow-up appointment.

## 2010-06-25 NOTE — Assessment & Plan Note (Signed)
Summary: Acute NP office visit - bronchitis   Primary Provider/Referring Provider:   Dr. Alroy Dust  CC:  increased SOB, wheezing, prod cough brown/green mucus, difficulty sleeping, and f/c/s x4days.  History of Present Illness: 75  year old white male patient of Dr. Kriste Basque with known history of DM, HTN, CAD, Hyperlipidemia    June 06, 2008--Complains of increased SOB, wheezing, prod cough with clear and green sputum x2 weeks - finished round of augmentin and medrol dose pak -Symptoms some better but not resolved.  Avelox 400mg  for 7 days,  Enalapril. changed to Benicar 20mg  . Labs showd K+ 5.6  up and sCr tr up at 1.9. Benicar stopped  June 19, 2008 --returns for follow up. Feeling much better with decreased cough, congestion, and dyspnea. Energy level is better. Needs repeat XRay and bmet today. Has not taken any benicar since call about labs. No headache or chest pain. Denies chest pain, dyspnea, orthopnea, hemoptysis, fever, n/v/d, edema.     ~  January 31, 2009:  I last saw him 9/09 for routine f/u visit- stable on his regular dosing of Advair500, Mucinex, & Xopenex... he misses several f/u appts and in the interim stopped his Metoprolol, Vasotec, ?others... he has followed up w/ DrHochrein & DrKlein- doing well, he says, & DrH restarted BBlocker Coreg & Enalapril Rx... he states that he's been regular w/ his Glucovance (2.5/500- 1/2 Bid) although he hasn't checked his BS recently... he also states that he's been taking his Prav80 + Fenofib160 daily (overdue for fasting labs)... I called [facility]  May 11, 2009--Presents for an acute office visit. Complains of prod cough with clear mucus, increased SOB, wheezing, increased fatigue with exertion x2weeks.  Went to [facility] on 04-30-09 and was given abx-Zpack.  and neb treatment. Xray with no acute changes-reviewed on imagecast. He has not been back as recommended for follow up from last visit. Continues to have continuous dry cough, but  worse over last 2 weeks, now productive. Earlier this year was changed from ACE Inhibitor to ARB d/t refractory cough. His lab work showed an elevated sCr. to 1.6 so ARB was held. Was restarted on ACE several months ago by cards. Justed returned from [place] from vacation.    ~  Jan11:  he had a bronchitic exac rx'd w/ Avelox, Pred, Mucinex, Tussionex & improved... discussed the need to continue the AdvairBid regularly + the Mucinex Bid, etc... DrHochrein recently incr the Coreg to 12.5Bid & decr the Lasix to 20mg  Qod due to low BP... he has mod seb derm- Rx w/ Selsun, T-gel & Lotrisone cream...   ~  August 30, 2009:  he reports a good 51mo- no new complaints or concerns... states he's not taking Lasix regularly, just Prn for swelling since BP is low... he notes occas nocturnal hypoglycenic episodes but has been taking the PM Glucovance Qhs> we discussed changing this to dinnertime dose... no CP, palpit/ resp exacerbations, etc...  December 05, 2009 ---Presents for an acute office visit. Complains of increased SOB, wheezing, prod cough brown/green mucus, difficulty sleeping, f/c/s x4days. OTC not working. He continues to have  recurrent cough and bronchitic flares. He remains on ACE inhibitor and nonselective beta blocker. Cough is keeping him up at night. Feels he is getting worn out from coughing. Denies chest pain,  orthopnea, hemoptysis, fever, n/v/d, edema, headache. Last cxr 12/10 w/ chronic changes of emphysema with bilateral pleural plaques and chronic interstitial   disease, left greater than right.  No focal pneumonia  or evidence  for pulmonary edema.       Medications Prior to Update: 1)  Advair Diskus 500-50 Mcg/dose  Misc (Fluticasone-Salmeterol) .... One Inhalation Two Times A Day... 2)  Xopenex Hfa 45 Mcg/act  Aero (Levalbuterol Tartrate) .... As Needed 3)  Mucinex 600 Mg  Tb12 (Guaifenesin) .... Take 2 Tabs Twice Daily W/ Plenty of Fluids.Marland KitchenMarland Kitchen 4)  Adprin B 325 Mg  Tabs (Aspirin  Buf(Cacarb-Mgcarb-Mgo)) .... Take 1 Tablet By Mouth Once A Day 5)  Mexiletine Hcl 150 Mg  Caps (Mexiletine Hcl) .... Take One Capsule Two Times A Day As Directed By Drhochrein 6)  Carvedilol 12.5 Mg Tabs (Carvedilol) .... Take 1 Tablet By Mouth Two Times A Day 7)  Enalapril Maleate 2.5 Mg Tabs (Enalapril Maleate) .... One Bib 8)  Furosemide 20 Mg Tabs (Furosemide) .... Take 1 Tab By Mouth As Needed... 9)  Pravastatin Sodium 80 Mg  Tabs (Pravastatin Sodium) .... Take 1 Tablet By Mouth Once A Day 10)  Fenofibrate 160 Mg  Tabs (Fenofibrate) .... Take 1 Tablet By Mouth Once A Day 11)  Glyburide-Metformin 2.5-500 Mg  Tabs (Glyburide-Metformin) .... Take 1/2 Tab By Mouth Two Times A Day W/ Breakfast & Dinner...  Current Medications (verified): 1)  Advair Diskus 500-50 Mcg/dose  Misc (Fluticasone-Salmeterol) .... One Inhalation Two Times A Day... 2)  Xopenex Hfa 45 Mcg/act  Aero (Levalbuterol Tartrate) .... As Needed 3)  Mucinex 600 Mg  Tb12 (Guaifenesin) .... Take 2 Tabs Twice Daily W/ Plenty of Fluids.Marland KitchenMarland Kitchen 4)  Adprin B 325 Mg  Tabs (Aspirin Buf(Cacarb-Mgcarb-Mgo)) .... Take 1 Tablet By Mouth Once A Day 5)  Mexiletine Hcl 150 Mg  Caps (Mexiletine Hcl) .... Take One Capsule Two Times A Day As Directed By Drhochrein 6)  Carvedilol 12.5 Mg Tabs (Carvedilol) .... Take 1 Tablet By Mouth Two Times A Day 7)  Enalapril Maleate 2.5 Mg Tabs (Enalapril Maleate) .... One Bib 8)  Furosemide 20 Mg Tabs (Furosemide) .... Take 1 Tab By Mouth As Needed... 9)  Pravastatin Sodium 80 Mg  Tabs (Pravastatin Sodium) .... Take 1 Tablet By Mouth Once A Day 10)  Fenofibrate 160 Mg  Tabs (Fenofibrate) .... Take 1 Tablet By Mouth Once A Day 11)  Glyburide-Metformin 2.5-500 Mg  Tabs (Glyburide-Metformin) .... Take 1/2 Tab By Mouth Two Times A Day W/ Breakfast & Dinner...  Allergies (verified): 1)  ! Niaspan (Niacin (Antihyperlipidemic))  Past History:  Past Medical History: Last updated: 08/30/2009 ASTHMATIC BRONCHITIS,  ACUTE (ICD-466.0) COPD (ICD-496) - ex-smoker, quit 2002... HYPERTENSION (ICD-401.9)    CORONARY ARTERY DISEASE (ICD-414.00)(last catheterization in October of 2008 demonstrated the left main had 40-50% stenosis, the LAD had minor irregularities, the circumflex had 80% stenosis prior to the takeoff of the OM1, and the right coronary artery was occluded.  The saphenous vein graft to the PDA and posterolateral was patent, the saphenous vein graft to the obtuse marginal was patent, the LIMA to the LAD was atretic.  The EF was 40%)  CONGESTIVE HEART FAILURE, SYSTOLIC, CHRONIC (ICD-428.0)  Hx of VENTRICULAR TACHYCARDIA  (VTach storm w/ AICD) PERIPHERAL VASCULAR DISEASE (ICD-443.9) (PTA to right SFA for an ABI of 0.36) Bilat carotid disease w/ bruits and mild-mod smooth plaque, 0-39%  HYPERCHOLESTEROLEMIA (ICD-272.0) DIABETES MELLITUS (ICD-250.00) - on GLUCOVANCE 2.5/500 1/2 tab Bid Amiodarone intolerance DIVERTICULOSIS OF COLON (ICD-562.10) COLONIC POLYPS (ICD-211.3) RENAL INSUFFICIENCY (ICD-588.9) DEGENERATIVE JOINT DISEASE (ICD-715.90) ANXIETY (ICD-300.00) SEBORRHEIC DERMATITIS (ICD-690.10)  Past Surgical History: Last updated: 08/30/2009 S/P CABG 2/03 Insertion of a single chamber defibrillator-  Medtronic Maxima 8/06 DrKlein  Family History: Last updated: 02-02-2009  Mother died of an MI at 35.  Father died of an MI at   67.  He had a younger brother who died of an MI in 61.      Social History: Last updated: 01/31/2009   He lives in [place], [place], with his wife.  He has a 100-pack-year history of tobacco abuse,  quitting about 6 years ago.  He previously used alcohol heavily but quit  about 5 years ago.  He is not routinely exercising. 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     Risk Factors: Smoking Status: quit (02/23/2008)  Review of Systems      See HPI  Vital Signs:  Patient profile:   75 year old male Height:      66.5 inches Weight:      175.38  pounds BMI:     27.98 O2 Sat:      91 % on Room air Temp:     97.9 degrees F oral Pulse rate:   80 / minute BP sitting:   104 / 64  (left arm) Cuff size:   regular  Vitals Entered By: Boone Master CNA/MA (December 05, 2009 9:59 AM)  O2 Flow:  Room air CC: increased SOB, wheezing, prod cough brown/green mucus, difficulty sleeping, f/c/s x4days Is Patient Diabetic? No Comments Medications reviewed with patient Daytime contact number verified with patient. Boone Master CNA/MA  December 05, 2009 9:59 AM    Physical Exam  Additional Exam:  WD, WN, 75 y/o WM in NAD... GENERAL:  Alert & oriented; pleasant & cooperative... HEENT:  [place]/AT, EACs-clear, TMs-wnl, NOSE-clear, THROAT: scattered white patches c/w thrush.  NECK:  Supple w/ fairROM; no JVD; normal carotid impulses w/o bruits; no thyromegaly or nodules palpated; no lymphadenopathy. CHEST:  Few scat rhonchi without wheezing, rales, or signs of consolid.., scant exp wheezing and upper airway psuedowheezing on forced exp. no stridor.  HEART:  Regular Rhythm;  gr1/6 SEM, without rubs or gallops heard... ABDOMEN:  Soft & nontender; normal bowel sounds; no organomegaly or masses detected. EXT: without deformities, mild arthritic changes; no varicose veins/ venous insuffic/ or edema. NEURO:  CN's intact; motor testing normal; sensory testing normal; gait normal & balance OK. DERM:  No lesions noted; no rash etc...     Impression & Recommendations:  Problem # 1:  ASTHMATIC BRONCHITIS, ACUTE (ICD-466.0)  Recurrent exacerbations -believe this pt would benefit from change off ACE and a more selective beta blocker. Will leave this w/ cardiology.  REC:  Avelox 400mg  once daily for 7 days Mucinex DM two times a day as needed cough/congestion  Prednisone taper over next week.  Brush, rinse and gargle after inhaler use.  Mycelex troche 5 x a day for 7 days for thrush.  Increase fluids.  follow up Dr. Kriste Basque 3 months as scheduled and as needed    Please contact office for sooner follow up if symptoms do not improve or worsen   His updated medication list for this problem includes:    Advair Diskus 500-50 Mcg/dose Misc (Fluticasone-salmeterol) ..... One inhalation two times a day...    Xopenex Hfa 45 Mcg/act Aero (Levalbuterol tartrate) .Marland Kitchen... As needed    Mucinex 600 Mg Tb12 (Guaifenesin) .Marland Kitchen... Take 2 tabs twice daily w/ plenty of fluids...    Avelox 400 Mg Tabs (Moxifloxacin hcl) .Marland Kitchen... 1 by mouth once daily  Orders: Xopenex 1.25mg  (E4540) Nebulizer Tx (98119) Est. Patient Level IV (14782)  Problem #  2:  CORONARY ARTERY DISEASE (ICD-414.00) Consider change of ACE to ARB and more selectve beta blocker to help decrease pulmonary side effects.  fax note to cards.  His updated medication list for this problem includes:    Adprin B 325 Mg Tabs (Aspirin buf(cacarb-mgcarb-mgo)) .Marland Kitchen... Take 1 tablet by mouth once a day    Mexiletine Hcl 150 Mg Caps (Mexiletine hcl) .Marland Kitchen... Take one capsule two times a day as directed by drhochrein    Carvedilol 12.5 Mg Tabs (Carvedilol) .Marland Kitchen... Take 1 tablet by mouth two times a day    Enalapril Maleate 2.5 Mg Tabs (Enalapril maleate) ..... One bib    Furosemide 20 Mg Tabs (Furosemide) .Marland Kitchen... Take 1 tab by mouth as needed...  Problem # 3:  CANDIDIASIS, ORAL (ICD-112.0)  Brush, rinse and gargle after inhaler use.  Mycelex troche 5 x a day for 7 days for thrush.   Orders: Est. Patient Level IV (81191)  Medications Added to Medication List This Visit: 1)  Avelox 400 Mg Tabs (Moxifloxacin hcl) .Marland Kitchen.. 1 by mouth once daily 2)  Prednisone 10 Mg Tabs (Prednisone) .... 4 tabs for 2 days, then 3 tabs for 2 days, 2 tabs for 2 days, then 1 tab for 2 days, then stop 3)  Mycelex 10 Mg Troc (Clotrimazole) .Marland Kitchen.. 1 by mouth five times a day for 7 days  Complete Medication List: 1)  Advair Diskus 500-50 Mcg/dose Misc (Fluticasone-salmeterol) .... One inhalation two times a day... 2)  Xopenex Hfa 45 Mcg/act Aero  (Levalbuterol tartrate) .... As needed 3)  Mucinex 600 Mg Tb12 (Guaifenesin) .... Take 2 tabs twice daily w/ plenty of fluids.Marland KitchenMarland Kitchen 4)  Adprin B 325 Mg Tabs (Aspirin buf(cacarb-mgcarb-mgo)) .... Take 1 tablet by mouth once a day 5)  Mexiletine Hcl 150 Mg Caps (Mexiletine hcl) .... Take one capsule two times a day as directed by drhochrein 6)  Carvedilol 12.5 Mg Tabs (Carvedilol) .... Take 1 tablet by mouth two times a day 7)  Enalapril Maleate 2.5 Mg Tabs (Enalapril maleate) .... One bib 8)  Furosemide 20 Mg Tabs (Furosemide) .... Take 1 tab by mouth as needed... 9)  Pravastatin Sodium 80 Mg Tabs (Pravastatin sodium) .... Take 1 tablet by mouth once a day 10)  Fenofibrate 160 Mg Tabs (Fenofibrate) .... Take 1 tablet by mouth once a day 11)  Glyburide-metformin 2.5-500 Mg Tabs (Glyburide-metformin) .... Take 1/2 tab by mouth two times a day w/ breakfast & dinner... 12)  Avelox 400 Mg Tabs (Moxifloxacin hcl) .Marland Kitchen.. 1 by mouth once daily 13)  Prednisone 10 Mg Tabs (Prednisone) .... 4 tabs for 2 days, then 3 tabs for 2 days, 2 tabs for 2 days, then 1 tab for 2 days, then stop 14)  Mycelex 10 Mg Troc (Clotrimazole) .Marland Kitchen.. 1 by mouth five times a day for 7 days  Patient Instructions: 1)  Avelox 400mg  once daily for 7 days 2)  Mucinex DM two times a day as needed cough/congestion  3)  Prednisone taper over next week.  4)  Brush, rinse and gargle after inhaler use.  5)  Mycelex troche 5 x a day for 7 days for thrush.  6)  Increase fluids.  7)  follow up Dr. Kriste Basque 3 months as scheduled and as needed  8)  Please contact office for sooner follow up if symptoms do not improve or worsen  Prescriptions: MYCELEX 10 MG TROC (CLOTRIMAZOLE) 1 by mouth five times a day for 7 days  #35 x 0   Entered and  Authorized by:   Rubye Oaks NP   Signed by:   Rubye Oaks NP on 12/05/2009   Method used:   Electronically to        [facility]  [phone]* (retail)       [address]       [place], [place]  [address]       Ph: [phone] or [phone]       Fax: [phone]   RxID:   [id number] PREDNISONE 10 MG TABS (PREDNISONE) 4 tabs for 2 days, then 3 tabs for 2 days, 2 tabs for 2 days, then 1 tab for 2 days, then stop  #20 x 0   Entered and Authorized by:   Rubye Oaks NP   Signed by:   Rubye Oaks NP on 12/05/2009   Method used:   Electronically to        [facility]  [phone]* (retail)       [address]       [place], [place]  [address]       Ph: [phone] or [phone]       Fax: [phone]   RxID:   [id number] AVELOX 400 MG TABS (MOXIFLOXACIN HCL) 1 by mouth once daily  #7 x 0   Entered and Authorized by:   Rubye Oaks NP   Signed by:   Rubye Oaks NP on 12/05/2009   Method used:   Electronically to        [facility]  [phone]* (retail)       [address]       [place], [place]  [address]       Ph: [phone] or [phone]       Fax: [phone]   RxID:   [id number]      Medication Administration  Medication # 1:    Medication: Xopenex 1.25mg     Diagnosis: ASTHMATIC BRONCHITIS, ACUTE (ICD-466.0)    Dose: 1 vial    Route: inhaled    Exp Date: 08-12    Lot #: [id number]    Mfr: Sepracor    Patient tolerated medication without complications    Given by: Zackery Barefoot CMA (December 05, 2009 10:25 AM)  Orders Added: 1)  Xopenex 1.25mg  [E3154] 2)  Nebulizer Tx [00867] 3)  Est. Patient Level IV [61950]   Appended Document: Acute NP office visit - bronchitis faxed via biscom to Dr. Jenene Slicker officer per TP's request

## 2010-06-25 NOTE — Miscellaneous (Signed)
Summary: Orders Update  Clinical Lists Changes  Orders: Added new Service order of Est. Patient Level III (99213) - Signed 

## 2010-07-01 ENCOUNTER — Encounter: Payer: Self-pay | Admitting: Internal Medicine

## 2010-07-01 ENCOUNTER — Encounter (INDEPENDENT_AMBULATORY_CARE_PROVIDER_SITE_OTHER): Payer: Medicare Other

## 2010-07-01 DIAGNOSIS — I428 Other cardiomyopathies: Secondary | ICD-10-CM

## 2010-07-11 NOTE — Assessment & Plan Note (Signed)
Summary: device/saf mca   Current Medications (verified): 1)  Advair Diskus 500-50 Mcg/dose  Misc (Fluticasone-Salmeterol) .... One Inhalation Two Times A Day... 2)  Xopenex Hfa 45 Mcg/act  Aero (Levalbuterol Tartrate) .... As Needed 3)  Mucinex 600 Mg  Tb12 (Guaifenesin) .... Take 2 Tabs Twice Daily W/ Plenty of Fluids.Marland KitchenMarland Kitchen 4)  Aspirin Ec 325 Mg Tbec (Aspirin) .... Take One Tablet By Mouth Daily 5)  Mexiletine Hcl 150 Mg  Caps (Mexiletine Hcl) .... Take One Capsule Two Times A Day As Directed By Drhochrein 6)  Carvedilol 12.5 Mg Tabs (Carvedilol) .... Take 1 Tablet By Mouth Two Times A Day 7)  Furosemide 20 Mg Tabs (Furosemide) .... Take 1 Tab By Mouth As Needed... 8)  Pravastatin Sodium 80 Mg  Tabs (Pravastatin Sodium) .... Take 1 Tablet By Mouth Once A Day 9)  Fenofibrate 160 Mg  Tabs (Fenofibrate) .... Take 1 Tablet By Mouth Once A Day 10)  Glyburide-Metformin 2.5-500 Mg  Tabs (Glyburide-Metformin) .... Take 1/2 Tab By Mouth Two Times A Day W/ Breakfast & Dinner... 11)  Enalapril Maleate 5 Mg Tabs (Enalapril Maleate) .... One Two Times A Day  Allergies (verified): 1)  ! Niaspan (Niacin (Antihyperlipidemic))     ICD Specifications Following MD:  Sherryl Manges, MD     Referring MD:  [facility] ICD Vendor:  Medtronic     ICD Model Number:  7232     ICD Serial Number:  [id number] ICD DOI:  12/27/2004     ICD Implanting MD:  Sherryl Manges, MD  Lead 1:    Location: RV     DOI: 12/27/2004     Model #: [id number]     Serial #: [id number]     Status: active  Indications::  VT   ICD Follow Up Battery Voltage:  3.00 V     Charge Time:  8.40 seconds     Underlying rhythm:  SR ICD Dependent:  No       ICD Device Measurements Right Ventricle:  Amplitude: 8.8 mV, Impedance: 536 ohms, Threshold: 1.0 V at 0.2 msec Shock Impedance: 59/78 ohms   Episodes MS Episodes:  0     Coumadin:  No Shock:  0     ATP:  0     Nonsustained:  6     Atrial Therapies:  0 Ventricular Pacing:  <0.1%  Brady  Parameters Mode VVI     Lower Rate Limit:  40      Tachy Zones VF:  200     VT:  250 FVT VIA VF     VT1:  140     Next Cardiology Appt Due:  09/24/2010 Tech Comments:  NORMAL DEVICE FUNCTION.  5 SVT EPISODES--LONGEST WAS 38 SECONDS.  5621 LEAD STABLE. SIC 0.  NO CHANGES MADE. ROV IN 3 MTHS W/DEVICE CLINIC. Vella Kohler  July 01, 2010 11:52 AM

## 2010-07-17 ENCOUNTER — Telehealth: Payer: Self-pay | Admitting: Pulmonary Disease

## 2010-07-18 ENCOUNTER — Encounter: Payer: Self-pay | Admitting: Pulmonary Disease

## 2010-07-23 NOTE — Progress Notes (Signed)
Summary: speak to doctor  Phone Note Call from Patient Call back at Home Phone [phone]   Caller: Patient Call For: Eleanore Junio Summary of Call: Wants to speak to SN in ref to seeing an orthopedist for his shoulder. Initial call taken by: Darletta Moll,  July 17, 2010 2:23 PM  Follow-up for Phone Call        pt called and stated that he is going to call and set up appt with Dr Thurston Hole for his shoulder problems/pain----he wanted to know if he needed to come by and pick up any of his records or could we just send this info to Dr. Malachy Mood to the pt that he would need to sign a release form for [place] to be able to send any records over there.  pt voiced his understanding of this and will call back to let me know when his appt will be. Randell Loop [facility]  July 17, 2010 3:27 PM   Additional Follow-up for Phone Call Additional follow up Details #1::        pt returned my call and he stated that Dr. Clide Cliff office can see him in the am for his shoulders---pt wanted his medical records sent to Dr. Thurston Hole but i explained that we would need a medical release form signed by him before we could do this.  pt stated that he would be by later to sign this Randell Loop [facility]  July 17, 2010 3:42 PM

## 2010-08-01 NOTE — Letter (Signed)
Summary: Nilda Simmer MD  Nilda Simmer MD   Imported By: Lester [place]onville 07/23/2010 09:22:04  _____________________________________________________________________  External Attachment:    Type:   Image     Comment:   External Document

## 2010-08-02 ENCOUNTER — Telehealth (INDEPENDENT_AMBULATORY_CARE_PROVIDER_SITE_OTHER): Payer: Self-pay | Admitting: *Deleted

## 2010-08-06 NOTE — Progress Notes (Signed)
Summary: needs copy of insurance cards faxed to [facility]  Phone Note Call from Patient   Caller: Patient Call For: nadel Summary of Call: patient phoned and would like to have a copy of his insurance cards faxed to [facility] he doesnt have their fax number but there phone number is [phone] Initial call taken by: Vedia Coffer,  August 02, 2010 10:50 AM  Follow-up for Phone Call        Faxed card.//Juanita Follow-up by: Darletta Moll,  August 02, 2010 12:32 PM

## 2010-08-07 ENCOUNTER — Other Ambulatory Visit: Payer: Self-pay | Admitting: Dermatology

## 2010-08-27 LAB — GLUCOSE, CAPILLARY: Glucose-Capillary: 182 mg/dL — ABNORMAL HIGH (ref 70–99)

## 2010-09-27 ENCOUNTER — Telehealth: Payer: Self-pay | Admitting: Pulmonary Disease

## 2010-09-27 NOTE — Telephone Encounter (Signed)
Rx was called to [facility] and left on VM b/c they were closed for lunch.

## 2010-10-07 ENCOUNTER — Other Ambulatory Visit: Payer: Self-pay | Admitting: Pulmonary Disease

## 2010-10-07 ENCOUNTER — Telehealth: Payer: Self-pay | Admitting: Pulmonary Disease

## 2010-10-07 MED ORDER — FLUTICASONE-SALMETEROL 500-50 MCG/DOSE IN AEPB: 1.0000 | INHALATION_SPRAY | Freq: Two times a day (BID) | RESPIRATORY_TRACT | Status: DC

## 2010-10-07 NOTE — Telephone Encounter (Signed)
Rx for advair was already called to this pharm on 09/27/10. I called and attempted to telephone this in again, but was transferred back to VM, so then I just sent rx electronically since last attempt at leaving VM for pharmacist failed. LMOM for pt to be made aware this was done.

## 2010-10-07 NOTE — Telephone Encounter (Signed)
Pt at [facility] and pharmacy calling for refill on the pt's Advair. Verbal order given for this and pt is aware.

## 2010-10-08 NOTE — H&P (Signed)
NAMEALEXYS, GASSETT            ACCOUNT NO.:  [id number]   MEDICAL RECORD NO.:  [id number]          PATIENT TYPE:  EMS   LOCATION:  [facility]                         FACILITY:  [facility]   PHYSICIAN:  Gerrit Friends. Dietrich Pates, MD, FACCDATE OF BIRTH:  Mar 25, 1936   DATE OF ADMISSION:  03/10/2007  DATE OF DISCHARGE:                              HISTORY & PHYSICAL   ADDENDUM:   PRIMARY CARDIOLOGIST:  [name], MD, [facility].   Please refer to electrophysiology office note, job number [id number], date  March 10, 2007, for complete details of the patient's past medical  history and history of present illness.   ADDENDUM:  Mr. Lardizabal was seen in the office today, March 10, 2007,  by Dr. Graciela Husbands and was noted by ICD interrogation to have a significant  increased in ventricular tachycardia burden.  Decision was made in the  office to repeat a blood chemistry and set him up for cardiac  catheterization this Friday, March 12, 2007.  His basic metabolic  panel unfortunately revealed the potassium of 6.2.  His magnesium was  low at 1.4.  Secondary to hyperkalemia, he was advised to present to the  [facility] ED for further evaluation and treatment  if necessary and subsequent admission with plan for catheterization in  the a.m. rather than on Friday.  Mr. Pundt offers no complaints here  in the ED.  Please see previously dictated note from this morning for  current medications.   FAMILY HISTORY:  Mother died of an MI at 7.  Father died of an MI at  75.  He had a younger brother who died of an MI in 25.   SOCIAL HISTORY:  He lives in [place], [place], with his wife.  He does office work.  He has a 100-pack-year history of tobacco abuse,  quitting about 6 years ago.  He previously used alcohol heavily but quit  about 5 years ago.  He is not routinely exercising.   REVIEW OF SYSTEMS:  He has had some nasal and chest congestion as well  as PND.  Otherwise, all  systems reviewed and negative.   PHYSICAL EXAM:  Unchanged from the exam this morning by Dr. Graciela Husbands,  please refer to previous note.   ASSESSMENT/PLAN:  The patient had a repeat BMET here in the ED.  Sodium  is 135, potassium 3.5 with normal renal function.  We will plan to admit  him and at this point does not require treatment for his potassium.  Will follow  up in the morning, placed him on cath board in the a.m.  Also plan to  repeat a magnesium as this is fairly low this morning and we will  replace this as necessary.  As outlined in Dr. Odessa Fleming note from this  morning, we will continue his home medications and increase his  amiodarone to 400 mg b.i.d.      Nicolasa Ducking, ANP      Gerrit Friends. Dietrich Pates, MD, [facility]  Electronically Signed    CB/MEDQ  D:  03/10/2007  T:  03/11/2007  Job:  [id number]

## 2010-10-08 NOTE — Cardiovascular Report (Signed)
NAMEALMUS, WOODHAM            ACCOUNT NO.:  [id number]   MEDICAL RECORD NO.:  [id number]          PATIENT TYPE:  INP   LOCATION:  6527                         FACILITY:  [facility]   PHYSICIAN:  Bevelyn Buckles. Bensimhon, MDDATE OF BIRTH:  12/11/1935   DATE OF PROCEDURE:  03/11/2007  DATE OF DISCHARGE:                            CARDIAC CATHETERIZATION   REFERRING PHYSICIAN:  Dr. Berton Mount.   REASON FOR TEST:  Ventricular tachycardia.   PATIENT IDENTIFICATION:  [name] is a delightful 75 year old male  with a history of coronary artery disease status post bypass surgery in  2003.  He has a resultant ischemic cardiomyopathy, EF about 40%; he has  an ICD in place.  He was seen yesterday in the EP clinic for routine  followup and noticed to have multiple episodes of asymptomatic  ventricular tachycardia.  He is referred today for cardiac  catheterization to rule out ischemia as a cause.   PROCEDURES PERFORMED:  1. Selective coronary angiography.  2. Saphenous vein graft angiography x2.  3. LIMA angiography.  4. Left heart catheterization.  5. Left ventriculogram.   DESCRIPTION OF THE PROCEDURE:  The risks and indication of the procedure  were explained.  Consent was signed and placed on the chart.  A 6-French  arterial sheath was placed in the right femoral artery using a modified  Seldinger technique.  Standard catheters including a JL-4, JR-4 and  angled pigtail were used for the procedure.  All catheter exchanges were  made over a wire.  There are no apparent complications.   Central aortic pressure 141/64 with a mean of 93.  LV was 139/8 with an  EDP of 17.   Left main was long.  There was a distal 40-50% stenosis.   LAD was a long vessel coursing to the apex.  It gave off one diagonal.  There were just some minor luminal irregularities at the distal vessel.   Left circumflex gave off a small ramus.  The circumflex proper gave off  a large OM-1 and a small OM-2.  There  was moderate nonobstructive  disease in the ramus.  In the proximal left circumflex prior to the  takeoff of the OM-1, there was an 80% lesion.  The distal OM was seen  filling by competitive flow from the graft.   Right coronary artery was totally occluded proximally.   Saphenous vein graft to the PDA and PL was widely patent.  It filled the  distal PDA and PL well.   Saphenous vein graft to the OM was widely patent, back-filled the left  circumflex well.   The LIMA to the LAD was atretic due to good native flow in the LAD.   Left ventriculogram done in the RAO position:  EF was somewhat hard to  ascertain due to ectopy, appeared to be about 40% with inferior basilar  and mid inferior akinesis.  There was 1+ mitral regurgitation.   ASSESSMENT:  1. Two-vessel coronary artery disease.  2. Stable revascularization with no change since last catheterization      in July 2006.  3. Atretic left internal mammary artery secondary to  good native flow      in the left anterior descending artery.  4. Moderately decreased left ventricular function.  5. Ventricular tachycardia.   Will continue with medical therapy for his coronary artery disease,  treatment of his ventricular tachycardia per Dr. Graciela Husbands and the EP  service.      Bevelyn Buckles. Bensimhon, MD  Electronically Signed     DRB/MEDQ  D:  03/11/2007  T:  03/12/2007  Job:  [id number]

## 2010-10-08 NOTE — Assessment & Plan Note (Signed)
[facility]                            CARDIOLOGY OFFICE NOTE   NAME:[name], [name]                   MRN:          [id number]  DATE:06/16/2008                            DOB:          [birth year]    PRIMARY CARE PHYSICIAN:  Dr. Lonzo Cloud. Kriste Basque, MD   REASON FOR PRESENTATION:  Evaluate the patient with cardiomyopathy and  ventricular tachycardia.   HISTORY OF PRESENT ILLNESS:  The patient presents for follow up of the  above.  Since I last saw him, he has been battling pneumonia recently.  I think she is finally getting over this.  He has seen Dr. Graciela Husbands.  Of  note, when he saw Dr. Graciela Husbands in September, he was no longer taking his  amiodarone.  He had been taking that when I saw him in May.  I cannot  find through the note where that was discontinued, but apparently Dr.  Graciela Husbands did  not object to that.  I see on the electronic medical record  that the medication was discontinued from his list in January of this  year, but obviously the patient has stopped taking it months before  that.  Regardless, he has had no further tachy palpitations or firings  of his ICD.  He has had no new shortness of breath, PND, or orthopnea  (other than his problems with pneumonia recently).  He has had no chest  pressure, neck, or arm discomfort.   PAST MEDICAL HISTORY:  1. Coronary artery disease (see the February 08, 2008, note for      details).  2. Cardiomyopathy (the approximately 40%).  3. Ventricular tachycardia status post ICD (Medtronic Maximo) carotid      plaque (less than 40% bilaterally).  4. Hypertension.  5. Dyslipidemia.  6. Diabetes mellitus.  7. Chronic obstructive pulmonary disease.  8. 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  9. Colonic polyps status post polypectomy.   ALLERGIES AND INTOLERANCES:  None.   MEDICATIONS:  1. Aspirin 325 mg daily.  2. Advair 500/50 b.i.d.  3.  Metoprolol 100 mg daily.  4. Glyburide.  5. Metformin 2.5/50 b.i.d.  6. Mucinex 1200 mg b.i.d.  7. Pravastatin 80 mg daily.   REVIEW OF SYSTEMS:  As stated in the HPI and otherwise negative for  other systems.   PHYSICAL EXAMINATION:  GENERAL:  The patient is pleasant and in no  distress.  VITAL SIGNS:  Blood pressure 127/73, heart rate 65 and regular, and  weight 175 pounds.  HEENT:  Eyelids unremarkable, pupils are equal, round, and reactive to  light, fundi not visualized, oral mucosa unremarkable.  NECK:  No jugular venous distention at 45 degrees, carotid upstroke  brisk and symmetric, no bruits, no thyromegaly.  LYMPHATICS:  No adenopathy.  LUNGS:  Decreased breath sounds, but no wheezing or crackles.  BACK:  No costovertebral angle tenderness.  CHEST:  Unremarkable except for well-healed ICD pocket and sternotomy  scar.  HEART:  PMI not displaced or sustained, S1 and S2 within normal limits,  no S3, no  S4, no clicks, no rubs, no murmurs.  ABDOMEN:  Obese, positive bowel sounds, normal in frequency and pitch,  no bruits, no rebound, no guarding, no midline pulsatile mass, no  organomegaly.  SKIN:  No rashes, no nodules.  EXTREMITIES:  2+ pulses, no edema.  NEUROLOGIC:  Grossly intact.   ASSESSMENT AND PLAN:  1. Cardiomyopathy.  The patient seems to be well compensated with      Class I-II symptoms.  At this point, no further change to his      regimen as indicated.  He will continue with medicines as listed.  2. Hypotension.  The patient has some recent problems with his blood      pressure falling.  Because of that he was taken off the enalapril.      It would be a nice with this could be restarted in the future, but      I will defer to Dr. Kriste Basque who sees him more frequently.  He has      also had a slightly high potassium which may have been another      reason to discontinue the potassium.  Certainly, he would benefit      from an ACE or an ARB if his blood pressure  will allow in the      future.  3. Ventricular tachycardia.  The patient has had no firings of his      defibrillator and is followed and will be seen again in the EP      Clinic apparently in March.  He is no longer taking his amiodarone.      There has been problem with compliance and I wonder if this      medicines just was stopped by the patient.  He remains on the      mexiletine that he was having trouble getting it.  We will ask our      pharmacist if there is anything we can do about that.  4. Coronary disease, having no ongoing symptoms.  No further      cardiovascular testing is suggested.  5. Dyslipidemia.  He is on pravastatin, has a reasonable LDL is 64.7      but his HDL is too low.  I have asked him to increase his exercise      to try to bring that up.  6. Followup.  I will see him back in September next year which will be      6 months after his EP appointment.    Rollene Rotunda, MD, [facility]  Electronically Signed   JH/MedQ  DD: 06/16/2008  DT: 06/17/2008  Job #: [id number]   cc:   Lonzo Cloud. Kriste Basque, MD

## 2010-10-08 NOTE — Assessment & Plan Note (Signed)
[facility]                         ELECTROPHYSIOLOGY OFFICE NOTE   NAME:[name], [name]                   MRN:          [id number]  DATE:02/08/2008                            DOB:          Apr 21, 1936    [name] is seen in followup for ventricular tachycardia with a  history of VT storm for which he takes mexiletine, amiodarone having  been discontinued.  His cough apparently is much better.   His other medications include aspirin, Advair, metoprolol, glyburide,  enalapril, Mucinex, and pravastatin.   PHYSICAL EXAMINATION:  VITAL SIGNS:  His blood pressure is 123/72 with a  pulse of 62.  LUNGS:  Clear.  HEART:  Sounds were regular.  EXTREMITIES:  Without edema.   Interrogation of the Medtronic Maximo ICD demonstrates an R-wave of 5.0,  impedance of 472 with threshold of 1 volts at 0.2.  Battery voltage is  3.09.  There are no intercurrent episodes.   IMPRESSION:  1. Ventricular tachycardia with history of storm.  2. Mexiletine for ventricular tachycardia with history of storm.  3. Ischemic heart disease.      a.     Prior bypass.      b.     Depressed left ventricular function.  4. Chronic congestive heart failure - systolic.  5. Renal insufficiency - class III in the past.   He is doing pretty well.  We will see him again in 3 months' time in the  Device Clinic.  He will follow up with Dr. Gala Romney.     [name] Salvia, MD, [facility]  Electronically Signed    SCK/MedQ  DD: 02/08/2008  DT: 02/08/2008  Job #: [id number]

## 2010-10-08 NOTE — Discharge Summary (Signed)
[name], [name]            ACCOUNT NO.:  [id number]   MEDICAL RECORD NO.:  [id number]          PATIENT TYPE:  INP   LOCATION:  6527                         FACILITY:  [facility]   PHYSICIAN:  [name], MD, FACCDATE OF BIRTH:  1935/06/30   DATE OF ADMISSION:  03/10/2007  DATE OF DISCHARGE:  03/13/2007                               DISCHARGE SUMMARY   This patient has NO KNOWN DRUG ALLERGIES.   Admitted from [facility] office with increasing burden of  nonsustained ventricular tachycardia per ICD interrogation   FINAL DIAGNOSES:  1. ATP  therapy since February 24, 2007  2. Antiarrhythmic medication adjustment this admission, amiodarone to      0.8 grams daily and mexiletine 150 mg twice daily.  3. Decrease in ventricular ectopy after antiarrhythmic medication      adjustment.   SECONDARY DIAGNOSES:  1. History of coronary artery disease.      a.     Ischemic cardiomyopathy, ejection fraction 35%      b.     Left heart catheterization 2003.  The patient had coronary       artery bypass grafts the LIMA to the LAD, the saphenous vein graft       to the OM and a sequential saphenous vein graft to the PDA and       then to the PLV.      c.     Implant cardioverter-defibrillator single chamber       defibrillator Medtronic August 2006.      d.     NSVT.  2. Hypertension.  3. Dyslipidemia.  4. Diabetes.  5. Chronic obstructive pulmonary disease .  6. Infrainguinal arterial occlusive disease status post PTCA of the      right superficial femoral artery March 2003.  7. History of polypectomy  8. Amiodarone therapy preceded this admission at 200 mg daily.   PROCEDURE:  1. March 11, 2007, left heart catheterization Dr. Gala Romney.  Study      showed ejection fraction of 40%.  The sequential saphenous vein      graft to the PDA and posterolateral branch was patent.  The      saphenous vein graft to the obtuse marginal was patent.  The LIMA      to the LAD is atretic  and occluded at the midpoint of the LIMA      graft.  The LAD however has very acceptable nonobstructive flow.   BRIEF HISTORY:  [name] is a 75 year old male.  He was seen in the  office October 15 with congestion and nocturnal cough.  This has  happened before when he was found to have ventricular tachycardia and  heart failure.  Interrogation of his cardioverter-defibrillator shows  that he had increased burden of nonsustained ventricular tachycardia  since February 24, 2007.  There was also one ATP.  The patient has a  significant increase in ventricular tachycardia burden.  Amiodarone will  be increased from 200 mg a day to 400 mg twice daily. We also plan left  heart catheterization and he will be brought directly from the  office to  [facility].   HOSPITAL COURSE:  The patient admitted directly to [facility]  from an office visit at [facility].  He had nocturnal cough and  congestion.  His amiodarone was increased as mentioned above from 200 mg  daily to 0.8 grams daily.  He had left heart catheterization October 16  which showed that three of his four grafts were patent.  The fourth was  the LIMA to the LAD was atretic since the LAD has a good nonobstructive  flow.  He had mild decrease in his ejection fraction of 40% and medical  management was recommended as prior to the admission.  However, he will  have increased antiarrhythmic protection for his increased ventricular  tachycardia burden.  His amiodarone which was increased to 0.8 grams  daily has been supplemented by mexiletine 150 mg twice daily. In the 36  hours after these medications were initiated the incidence of  ventricular ectopy has decreased dramatically.  He has only couplets and  triplets now, has no nonsustained ventricular tachycardia.  The patient  is feeling well and discharges postprocedure day #2 after left heart  catheterization.  He goes home on the following medications.   1. Amiodarone 200 mg tablets 2 tablets in the morning, 2 tablets in      the evening.  2. Mexiletine 150 mg 1 tablet in the morning, 1 tablet in the evening.      This is new medication.  3. Enteric-coated aspirin 325 mg daily  4. Toprol XL 100 mg daily.  5. Glyburide 2.5/500 twice daily.  6. Simvastatin 40 mg daily at bedtime.  7. Enalapril 2.5 mg daily.  This is down from 10 mg daily.  A new dose      at 2.5 mg daily.  8. Mucinex 600 mg 1-2 tablets daily  9. Advair 500/50 1 puff daily.   Dr. Graciela Husbands asked that he dump his cardioverter-defibrillator memory over  the telephone on Friday October 24 and Friday October 31 at [facility] office.  He will see Dr. Graciela Husbands Thursday November 6 at 1:45  p.m.   LABS:  Complete blood count this admission was white cells 11.2,  hemoglobin 14.6, hematocrit 43.1, platelets of 218.  Serum electrolytes:  Sodium 136, potassium 4.7, chloride 101, carbonate 30, BUN is 21,  creatinine 1.39, glucose 193, protime 12.5, INR 0.9, alkaline  phosphatase of 70, SGOT is 21, SGPT is 20, magnesium 2.9.      Maple Mirza, [name]      [name], MD, [facility]  Electronically Signed    GM/MEDQ  D:  03/12/2007  T:  03/14/2007  Job:  [id number]   cc:   [name], MD, [facility]  Scott M. Kriste Basque, MD

## 2010-10-08 NOTE — Assessment & Plan Note (Signed)
[facility]                         ELECTROPHYSIOLOGY OFFICE NOTE   NAME:Weiher, SALAH NAKAMURA                   MRN:          [id number]  DATE:12/23/2006                            DOB:          1936/02/09    Mr. Lafond was seen today in the clinic on today December 23, 2006 at the  patient's request for followup of his Medtronic model no. 7232 Maximo.  This gentleman does have a 6949 alert lead.  Date of implant was December 27, 2004 for ventricular tachycardia.  The patient's wife called  concerned about the lead.  He has not been shocked, but wanted the lead  checked anyway.  On interrogation of his device today, his battery  voltage is 3.16, with a charge time of 7.45 seconds.  Both the patient  and his wife said they heard some kind of beeping months ago and went to  the emergency room.  The device was checked by industry, and nothing was  found.  Today, his R waves measures 6.6 mV, with a ventricular capture  threshold of 1 V at 0.2 msec, and a ventricular lead impedance of 488  ohms.  Shock impedance was 49.  There were 80 nonsustained episodes.  Many from what I can see that have electrograms stored appeared to be  SVT, and wavelet was able to distinguish that.  It should be noted that  the last time we checked him in the office was November 2007, and he has  not called in through [facility] as he was scheduled.  Today, his  underlying rhythm was sinus at 63 beats a minute.  There were no ATP  therapies or shocks delivered.  He is programmed to the VVI mode at 40.  No changes were made in his parameters today.  The patient's wife also  noted that he has two friends that also have this particular lead in and  are being followed elsewhere in [place], and both of these  patients were given magnets and told to put the magnet on their  defibrillators if they repeatedly get shocked, and her concern is that  they drive to the beach and to outlying  places where an emergency room  is pretty far away, and if he continued to get shocked for no reason  they would be very upset with that and wanted also to have a magnet for  that reason.  I discussed the matter with Dr. Ladona Ridgel and educated them  as much as possible that if he is getting multiple shocks and is awake  and feeling fine, only to use the magnet then and get to the emergency  room right away.  If he is being shocked and is not feeling well or has  passed out, do not put the magnet in place, and allow the defibrillator  to do what it is supposed to do.  He is scheduled for followup in 3  months' time with Dr. Graciela Husbands, and at that time we will reestablish his  [facility] phone calls.  I rechecked all of his alerts and made sure that  that 6949 was programmed, and it  was protocol for the alerts, and  reiterated with them the time that the alert would go off at 7 a.m., and  what to do if they do hear tones.      Altha Harm, LPN  Electronically Signed      [name], MD, [facility]  Electronically Signed   PO/MedQ  DD: 12/23/2006  DT: 12/24/2006  Job #: [id number]

## 2010-10-08 NOTE — Assessment & Plan Note (Signed)
[facility]                         ELECTROPHYSIOLOGY OFFICE NOTE   NAME:Poma, SRIHAN BRUTUS                   MRN:          [id number]  DATE:03/10/2007                            DOB:          October 31, 1935    Mr. Ayre comes in today.  He has complaints of some congestion and  nocturnal coughing.  This happened prior to a hospitalization before  when he was found to have ventricular tachycardia and heart failure.   His exercise tolerance is not appreciably changed, however; he does not  have significant peripheral edema and has not had paroxysmal nocturnal  dyspnea.   He has had problems in the interim, however, with significant  hyperkalemia.  This was seen when he went to go see one of the  nephrologists.  Repeat potassiums were noted to be normal.  The  mechanism of this is not known.   MEDICATIONS:  1. Amiodarone 200.  2. Toprol 100.  3. Glyburide/metformin 5/500.  4. Simvastatin 40.  5. Enalapril 10 b.i.d.  6. Aspirin.   PHYSICAL EXAMINATION:  VITAL SIGNS:  Blood pressure 116/60, pulse 58,  weight 187 which is up about 5 pounds from the last year.  NECK:  Neck veins were flat.  LUNGS:  Clear.  CARDIOVASCULAR:  Heart sounds were regular without murmurs.  ABDOMEN:  Soft.  EXTREMITIES:  Just trace edema.  SKIN:  Warm and dry.   Interrogation of his Medtronic Maximo ICD demonstrated an R-wave of 7.3  with impedance of 488, threshold of 1 volt at 0.5, battery voltage 3.13.  There was one ATP and 1700 episodes of nonsustained ventricular  tachycardia.  Looking at his cardiac compass report, there has been a  huge flurry of VT in the last month beginning just about February 24, 2007, having greater than 10 episodes a day and indeed there were more  than 50 episodes in the last 24 hours.  These range from beats to  numbers of seconds.  There has been one therapy.   IMPRESSION:  1. Ventricular tachycardia - recurrent with a significant  uptake since      February 24, 2007.  2. Ischemic heart disease with      a.     Prior bypass surgery.      b.     Depressed left ventricular function.      c.     More remote catheterization.  3. Amiodarone for #1.  4. Congestive heart failure - chronic - systolic, potentially acutely      worsening.  5. Diabetes.  6. 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   Mr. Ishler has a significant increase in his ventricular tachycardia  burden.  From initial therapy, we will plan to increase his amiodarone  from 200 a day to 400 mg twice daily.   The other issue is to identify potential triggers.  We will plan to  check today his lab work including BMET, magnesium, BNP.  Will also  arrange for him to undergo catheterization.  We have discussed potential  benefits as well as the risks of that procedure.   It may well  be that he will need to be considered for a catheter  ablation for his ventricular tachycardia.     [name], MD, [facility]  Electronically Signed    SCK/MedQ  DD: 03/10/2007  DT: 03/10/2007  Job #: [id number]

## 2010-10-08 NOTE — Assessment & Plan Note (Signed)
[facility]                         ELECTROPHYSIOLOGY OFFICE NOTE   NAME:Coll, UZIEL COVAULT                   MRN:          [id number]  DATE:04/01/2007                            DOB:          11/22/1935    Mr. Witter comes in, having been recently hospitalized for  ventricular tachycardia.  His amiodarone dose was increased.  Over the  last month or so, he has had problems with coughing.  He went to go see  Dr. Kriste Basque and on that day, it seemed to have abated.  It is productive.  This is concurrent with the initiation of mexiletine, but I do not think  that is related.  He has not noticed, but he does have peripheral edema.  He has also had a couple of episodes of nocturnal hypoglycemia.   MEDICATIONS:  1. Glyburide/Metformin 2.5/500 b.i.d.  2. Simvastatin 40.  3. Mixelitine 150 b.i.d.  4. Amiodarone 400.  5. Aspirin.   PHYSICAL EXAMINATION:  Blood pressure 121/58, pulse 63.  His neck veins were 78 cm.  His carotids were brisk.  LUNGS:  Clear.  HEART SOUNDS:  Regular with no early systemic murmur.  ABDOMEN:  Soft.  EXTREMITIES:  Peripheral edema 1-2+.   Interrogation of his Medtronic Maximo ICD demonstrated no intercurrent  ventricular tachycardia.   IMPRESSION:  1. Ventricular tachycardia, recurrent.  2. Amiodarone/mexiletine for #1.  3. Ischemic heart disease.      a.     Prior bypass.      b.     Depressed left ventricular function, now ejection fraction       of about 40%.      c.     No obstructive epicardial disease by recent catheterization.  4. Congestive heart failure, chronic.  Systolic.  Even though he had a      BNP of only 70 or 80 in August.  5. Diabetes.  6. Nocturnal hypoglycemia.  7. Progressive renal insufficiency, creatinine going from 1.3 to 1.7.   I am concerned about Mr. Domingos's cough.  I think the differential  would include drug reactions to amiodarone, congestive heart failure,  infection, etc.   PLAN:  1. We will plan to down-titrate his amiodarone.  2. Begin him on a Z pack.  3. Begin him on Lasix 20 mg a day.  4. Check his renal function.  Recheck his renal function to see      whether decreasing creatinine clearance may be contributing to      hypoglycemia and/or congestive failure.  5. Check a BNP.  6. We will plan to have him come back in about two weeks time for      reassessment with one of the PAs.     [name], MD, [facility]  Electronically Signed    SCK/MedQ  DD: 04/01/2007  DT: 04/01/2007  Job #: [id number]

## 2010-10-08 NOTE — Assessment & Plan Note (Signed)
[facility]                            CARDIOLOGY OFFICE NOTE   NAME:[name], [name]                   MRN:          [id number]  DATE:10/08/2007                            DOB:          1935-12-07    PRIMARY CARE PHYSICIAN:  Dr. Alroy Dust.   REASON FOR PRESENTATION:  Evaluate a patient with cardiomyopathy and  ventricular tachycardia.   HISTORY OF PRESENT ILLNESS:  The patient presents for followup.  He has  not been seen here since being seen in the electrophysiology clinic in  November.  At that time this was a followup for ventricular tachycardia  for which he had been hospitalized in October.  At that time he had  firings of his ICD.  He had a cardiac catheterization with the results  described below.  He was managed with increased doses of amiodarone and  mexiletine.   Since then he said he has done well.  He is followed by Dr. Kriste Basque.  He  says he has not had any further palpitations and no firings of his  defibrillator.  He has had no presyncope or syncope.  He denies any  chest discomfort, neck or arm discomfort.  He has had no shortness of  breath and denies any PND or orthopnea.   Unfortunately, he has not responded to requests from the device clinic  to come in for interrogation.  They have tried to call him.  They have  sent him letters and actually a registered letter.   PAST MEDICAL HISTORY:  Coronary artery disease (last catheterization in  October of 2008 demonstrated the left main had 40-50% stenosis, the LAD  had minor irregularities, the circumflex had 80% stenosis prior to the  takeoff of the OM1, and the right coronary artery was occluded.  The  saphenous vein graft to the PDA and posterolateral was patent, the  saphenous vein graft to the obtuse marginal was patent, the LIMA to the  LAD was atretic.  The EF was approximately 40%.), ventricular  tachycardia status post ICD (Medtronic Maximo), ischemic cardiomyopathy,  carotid plaque (less than 40% bilateral), hypertension, dyslipidemia,  diabetes mellitus, chronic obstructive pulmonary disease, infrainguinal  occlusive disease status post PTCA of the right superficial femoral  artery in March of 2003, colonic polyps status post polypectomy.   ALLERGIES AND INTOLERANCES:  None.   MEDICATIONS:  Aspirin 325 mg daily, Advair, metoprolol 100 mg daily,  glyburide metformin 2.5/500 b.i.d., simvastatin 40 mg q.h.s., mexiletine  150 mg b.i.d., enalapril 2.5 mg daily, amiodarone 400 mg b.i.d.,  Mucinex.   REVIEW OF SYSTEMS:  As stated in the HPI and otherwise negative for  other systems.   PHYSICAL EXAMINATION:  The patient is in no distress.  Blood pressure  112/64, heart rate 64 and regular.  HEENT:  Eyelids unremarkable.  Pupils equal, round and reactive to  light.  Fundi not visualized.  Oral mucosa unremarkable.  NECK:  No jugular venous distention.  Wave form within normal limits.  Carotid upstrokes brisk and symmetric.  No bruits.  No thyromegaly.  LYMPHATICS:  No cervical, axillary or inguinal adenopathy.  LUNGS:  Clear to auscultation bilaterally.  BACK:  No costovertebral angle tenderness.  CHEST:  Well-healed ICD pocket and sternotomy scar.  HEART:  PMI not displaced or sustained.  S1 and S2 within normal limits.  No S3, no S4.  No clicks, no rubs, no murmurs.  ABDOMEN:  Obese.  Positive bowel sounds.  Normal in frequency and pitch.  No bruits.  No rebound, guarding or midline pulsatile mass.  No  hepatomegaly or splenomegaly.  SKIN:  No rashes, no nodules.  EXTREMITIES:  2+ pulses.  No cyanosis, no clubbing, right greater than  left bilateral femoral bruits.  NEURO:  Oriented to person, place and  time.  Cranial nerves II-XII grossly intact.  Motor grossly intact  throughout.   EKG:  Sinus rhythm, interventricular conduction delay, axis within  normal limits, no acute ST-T wave changes.   ASSESSMENT/PLAN:  1. Cardiomyopathy.  The patient  has cardiomyopathy as described.  He      has class I symptoms at this point.  I will make no change to his      medical regimen.  He will continue on the meds as listed.  2. Status post implantable cardioverter defibrillator.  We had this      interrogated today.  There were no abnormalities.  He has been told      that he cannot ignore our requests to come into the device clinic.      He will be set up to see Dr. Graciela Husbands.  At this point I am going to      reduce his amiodarone to 200 mg twice a day.  I have also carefully      explained to him the need for laboratory followup and I will get a      CMET and a TSH today.  He understands the need for ophthalmologic      exams and we wrote this down for him.  We discussed pulmonary      testing, which can be done by Dr. Kriste Basque.  We discussed the      neurologic sequelae and skin manifestations of amiodarone.  3. Dyslipidemia.  I stopped the simvastatin because of the interaction      between this drug and amiodarone.  I will increase him to 80 mg of      pravastatin.  He needs to get a lipid profile in 8 weeks.  4. Hypertension.  Blood pressure is well controlled and he will      continue the medications as listed.  5. Coronary disease.  His catheterization was as above.  He is having      no symptoms.  He needs secondary risk reduction.  6. Diabetes.  Per Dr. Kriste Basque.  7. Followup.  I would like to see him in 6 months or sooner if needed.     Rollene Rotunda, MD, [facility]  Electronically Signed    JH/MedQ  DD: 10/08/2007  DT: 10/08/2007  Job #: [id number]   cc:   Lonzo Cloud. Kriste Basque, MD

## 2010-10-11 NOTE — H&P (Signed)
[name], [name]            ACCOUNT NO.:  [id number]   MEDICAL RECORD NO.:  [id number]          PATIENT TYPE:  INP   LOCATION:  1825                         FACILITY:  [facility]   PHYSICIAN:  Doylene Canning. Ladona Ridgel, M.D.  DATE OF BIRTH:  Oct 20, 1935   DATE OF ADMISSION:  07/25/2005  DATE OF DISCHARGE:                                HISTORY & PHYSICAL   ADMITTING DIAGNOSIS:  Ventricular tachycardia storm resulting in multiple  implantable cardio-defibrillator shocks.   HISTORY OF PRESENT ILLNESS:  The patient is a very pleasant 75 year old male  with an ischemic cardiomyopathy, who was admitted to the hospital with  multiple ICD shocks.  He has a history of known coronary disease and severe  three-vessel disease status post bypass surgery in February of 2003.  He has  class II heart failure.  He has a history of VT and was admitted to the  hospital for additional evaluation several months ago and had an ICD placed.  He has done well but over the last several days has felt intermittent cold  sweats and hot flashes.  He saw Dr. Kriste Basque and was prescribed an antibiotic.  His dose of Toprol, which had been increased from 150 to 200, was decreased  down to 100 mg a day.  He was in his usual state of health until this  morning when he was awakened and got a drink and received multiple ICD  shocks.  Interrogation of his strips demonstrate multiple episodes of VT,  some of which were pace terminated and some of which resulted in ICD  discharge.  He is admitted now for additional evaluation.  He denies chest  pain.  He denies increasing shortness of breath.   PAST MEDICAL HISTORY:  1.  As noted above.  2.  History of COPD.  3.  Polypectomy.  4.  Hypertension  5.  Diabetes.  6.  Heart failure.  7.  Dyslipidemia.   FAMILY HISTORY:  Notable for his mother dying at age 37 of renal failure and  an MI, and his father dying of an MI in his 61s.  He had a brother, who died  suddenly of an MI.   REVIEW OF SYSTEMS:  Notable for fevers and chills and sweats as noted above.  He denies weight changes, headache, vision, or hearing problems.  He does  have occasional dizzy spells.  He denies skin rashes or lesions.  He denies  chest pain.  He has dyspnea with exertion.  He denies cough or hemoptysis.  He had a history of productive sputum.  He denies dysuria or hematuria.  He  denies weakness, numbness, or depression.  He denies nausea or vomiting.  He  has had some diarrhea.  He denies polyuria or polydipsia, heat or cold  intolerance.  He denies joint swelling but does have arthralgias.   PHYSICAL EXAMINATION:  GENERAL:  He is a pleasant 75 year old man in no  acute distress.  VITAL SIGNS:  Blood pressure was 125/72, the pulse was 110 and regular,  respirations were 22, temperature was 102.8 on admission.  SATs 93% on room  air.  HEENT:  Normocephalic and atraumatic, pupils equal and round, the oropharynx  was moist, his sclerae were anicteric.  NECK:  No jugulovenous distention.  There was no thyromegaly.  The carotids  were 2+ and symmetric.  CARDIOVASCULAR:  Regular rate and rhythm with normal S1 and S2.  There were  no murmurs, rubs, or gallops.  The PMI was laterally displaced and slightly  enlarged.  LUNGS:  Scattered wheezes and rales bilaterally.  There was no increased  work of breathing.  ABDOMEN:  Soft, nontender, and nondistended, the bowel sounds were present,  and there was no rebound or guarding.  EXTREMITIES:  No clubbing, cyanosis, or edema.  The pulses were 2+ and  symmetric.  NEUROLOGIC:  Alert and oriented x3 with cranial nerves intact.  The strength  was 5/5 and symmetric.   LABORATORY DATA:  Chest x-ray demonstrated COPD changes with a suspicious  right lower lobe infiltrate for a pneumonia.  EKG demonstrates sinus  tachycardia.  Labs demonstrate a white count of 16, hemoglobin of 11,  creatinine of 2.5, ABGs 7.42.  Initial cardiac enzymes were slightly   elevated.   IMPRESSION:  1.  Multiple implantable cardio-defibrillator discharges.  2.  Probable pneumonia.  3.  Congestive heart failure.  4.  Elevated cardiac enzymes, question unstable angina versus enzyme      elevation secondary to multiple shocks.   DISCUSSION:  The patient will be admitted to the hospital, he will be  started on IV Amiodarone, we will give him some IV antibiotics, serial  cardiac enzymes will be done.  Additional evaluation will be forthcoming.  Would anticipate that the patient will ultimately end up on Amiodarone.           ______________________________  Doylene Canning. Ladona Ridgel, M.D.     GWT/MEDQ  D:  07/25/2005  T:  07/26/2005  Job:  [id number]   cc:   Rollene Rotunda, M.D.  [address]  [address]  [place]  [place] [address]

## 2010-10-11 NOTE — H&P (Signed)
NAME:  [name], [name]                      ACCOUNT NO.:  [id number]   MEDICAL RECORD NO.:  [id number]                   PATIENT TYPE:  INP   LOCATION:  0469                                 FACILITY:  [facility]   PHYSICIAN:  Sean A. Everardo All, M.D. [facility]           DATE OF BIRTH:  12/20/1935   DATE OF ADMISSION:  12/10/2002  DATE OF DISCHARGE:                                HISTORY & PHYSICAL   REASON FOR ADMISSION:  Intractable vomiting.   HISTORY OF PRESENT ILLNESS:  The patient is a 75 year old man with one day  of severe nausea and vomiting.  There is associated slight blood in the  vomitus.  The patient had an oral surgery procedure on the left upper teeth  three days ago to prepare him for a permanent dental implant there.   PAST MEDICAL HISTORY:  1. Hypertension.  2. Dyslipidemia.  3. Coronary artery disease.  4. Osteoarthritis.  5. Allergic rhinitis.  6. Type 2 diabetes.   MEDICATIONS:  1. Lipitor 20 mg daily.  2. Toprol 50 mg daily.  3. Decadron at an uncertain dosage.  4. Bextra 20 mg daily.  5. Altace 5 mg daily.  6. Percocet one q.4 h. as needed for pain, 5/500.  7. Clarinex 5 mg daily.  8. Keflex 500 mg t.i.d.  9. Amaryl at an uncertain dosage.   SOCIAL HISTORY:  The patient is married and he works in Network engineer.   FAMILY HISTORY:  No one else at home is ill.   REVIEW OF SYSTEMS:  Denies the following:  Skin rash, painful urination,  chest pain, cough, loss of consciousness, dizziness, diarrhea, abdominal  pain, fever, hematuria, or rectal bleeding.   PHYSICAL EXAMINATION:  VITAL SIGNS:  Blood pressure 169/88, heart rate 77,  respiratory rate 20, afebrile.  GENERAL:  No distress.  SKIN:  Not dry.  HEENT:  Head is atraumatic.  Sclerae nonicteric.  Pharynx is clear.  I do  not see any swelling, erythema, or tenderness of the teeth or periodontal  areas.  NECK:  Supple.  CHEST:  Clear to auscultation.  CARDIOVASCULAR:  No JVD, no edema.  Regular rate  and rhythm, no murmur.  EXTREMITIES:  Pedal pulses are intact.  Mild osteoarthritic changes.  ABDOMEN:  Soft, nontender.  No hepatosplenomegaly, no mass.  GENITAL/RECTAL:  Examination not done at this time due to patient's  condition.  NEUROLOGICAL:  Alert, well oriented.  Does not appear anxious or depressed.  Cranial nerves appear to be intact and the patient moves all fours.   LABORATORY DATA:  Remarkable for glucose 253, BUN 36.  WBC 15,400.   IMPRESSION:  1. Intractable vomiting, probably postoperative from his recent oral     surgery.  2. Elevated white blood count, probably due to the Decadron.  3. Diabetes, which is probably exacerbated by his Decadron.  4. Hypertension, which probably has a situational component now.  5. Other chronic medical problems  as noted above.   PLAN:  1. I discussed with the patient the risks and benefits of discharge home     versus hospital admission.  He agrees to hospital admission in view of     his intractable vomiting.  2. Symptomatic therapy.  3. Check chest x-ray and urinalysis to work up the elevated WBC, although it     is probably due to the Decadron.  4. Will prescribe the Amaryl at an empiric dosage.  5. Discontinue the Decadron.  6. I discussed code status with the patient.  He states he wants full code,     but would not to be started nor maintained on artificial life support     systems if it was determined by his physicians that there was not a     reasonable chance of a functional recovery.                                               Sean A. Everardo All, M.D. [facility]    SAE/MEDQ  D:  12/10/2002  T:  12/10/2002  Job:  [id number]

## 2010-10-11 NOTE — H&P (Signed)
NAME:  [name], [name]            ACCOUNT NO.:  [id number]   MEDICAL RECORD NO.:  [id number]          PATIENT TYPE:  INP   LOCATION:  2927                         FACILITY:  [facility]   PHYSICIAN:  Anna Genre. Maisie Fus, M.D. [facility] OF BIRTH:  [birth year]   DATE OF ADMISSION:  12/21/2004  DATE OF DISCHARGE:                                HISTORY & PHYSICAL   Patient was initially seen at the emergency department at [facility] and subsequently transferred to [facility]. [facility].   PRIMARY CARDIOLOGIST:  Arturo Morton. Riley Kill, M.D., [facility].   PRIMARY CARE PHYSICIAN:  Dr. Lennon Alstrom in [place], [place].   CHIEF COMPLAINT:  Shortness of breath.   HISTORY OF PRESENT ILLNESS:  [name] is a 75 year old gentleman with a  history of coronary artery disease, status post CABG in February 2003, with  the following grafts placed, a saphenous vein graft to the PDA, saphenous  vein graft to the posterolateral branch of the right coronary artery,  saphenous vein graft to the circumflex, LIMA to the LAD.  His ejection  fraction was noted at that time to be 35%.  He presented to the emergency  department at [facility] with the complaint of shortness of breath  which started the day before presentation.  Patient indicated he had been  doing some work in the house and noted that he was having increasing  shortness of breath that he initially attributed to exposure from dust in  the area in which he was working.  His shortness of breath continued  throughout the night and actually worsened during the day today, prompting  him to seek emergency care.  In the emergency department he was found to be  in a wide complex tachycardia with a rate of 215.  He was initially treated  with adenosine that caused no significant effect on the tachycardia and  eventually broke with lidocaine.  He denies any syncope, no palpitations and  was hemodynamically stable when he was  noted to be in his wide complex  tachycardia.  Additionally, he noted no chest pain, only shortness of  breath, which started one day prior to presentation.  Additionally, he  denies any palpitations.   MEDICATIONS:  1.  Advair two puffs daily.  2.  Lipitor 20 mg daily.  3.  Glucovance 1.25 mg/250 mg twice a day.  4.  Tricor 145 daily.  5.  Aspirin 81 daily.   PAST MEDICAL HISTORY:  1.  Coronary artery disease as stated in his HPI. Of note, his coronary      artery disease was found after he was noted to have an abnormal EKG      during preoperative evaluation for polyp resection.  2.  Diabetes mellitus on oral medications.  3.  Hypertension.  4.  Hyperlipidemia.  5.  COPD/reactive airway disease.  6.  Peripheral artery disease.  7.  PTCA of right superficial femoral artery in March of 2003.  He had an      ABI noted to be 0.36 at that time.  8.  History of a polypectomy.  SOCIAL HISTORY:  He lives in [place], [place], with his wife.   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  He has two daughters, both of which live in the area.   He has remote tobacco use greater than 70-pack-year history, quit in 2002.  He does not exercise regularly, however, is somewhat active and plays golf  regularly on the weekends.  He uses no alcohol or drugs and no herbal  medications.   FAMILY HISTORY:  Noncontributory to this admission.   REVIEW OF SYSTEMS:  Negative with the excellent of cardiopulmonary which was  outlined in detail in his history of present illness.  He is a full code,  does have a living will.   PHYSICAL EXAMINATION:  GENERAL APPEARANCE:  He is diaphoretic, otherwise  appears in no apparent distress.  VITAL SIGNS:  He is afebrile, his pulse is 104, respiratory rate 20, blood  pressure ranged between 120 to 150 systolic/70s to 90s diastolic.  HEENT:  Unremarkable.  NECK:  Supple.  No lymphadenopathy appreciated, no  thyromegaly.  He has  audible carotid bruits.  His JVP was not elevated.  He had no appreciable  accessory muscle use.  LUNGS:  No crackles but there was expiratory wheezing and prolonged  expiratory phase.  CARDIOVASCULAR:  Normal S1 and S2.  There was no audible murmurs, rubs, or  gallops.  ABDOMEN:  Soft and nontender, somewhat obese.  There was no palpable  organomegaly.  EXTREMITIES:  No edema.  He had 1+ distal pulses that were symmetric  bilaterally.  SKIN:  Unremarkable.  NEUROLOGIC:  Alert and oriented x3.  Cranial nerves II-XII grossly intact.  Sensation and motor function appeared intact throughout.   His chest x-ray showed evidence of flattened diaphragms consistent with  chronic obstructive pulmonary disease and possible emphysema.  There was no  acute air space disease.   EKG initial one is a tachycardia, showed a rate of 215, rhythm was with Q  restoration of approximately 156, his axis was northwest.  There were no STT  wave changes  denoted and he had an R to S greater than 100 msec.  There was  a northward axis, both of which suggestive of ventricular tachycardia.   His labs were remarkable for white count of 20.1 with mild left shift,  otherwise, his platelets are 263, hematocrit 43.  His chem-7 is notable for  creatinine of 1.5, BUN 29, glucose of 145.  His GI panel was within normal  limits.  His first set of cardiac markers showed troponin I of 0.11 and the  repeat was 0.08 and an MB was 3.0 and 1.8.  Magnesium is pending at the time  of dictation.   IMPRESSION AND PLAN:  A 75 year old gentleman with history of coronary  artery disease, status post coronary artery bypass grafting who presents  with a wide complex tachycardia suggestive of ventricular tachycardia with  his primary symptom being shortness of breath, no chest pain, no syncope, no  hemodynamic instability.  1.  Wide complex tachycardia suggestive of ventricular tachycardia.  We are       currently ruling out all reversible causes.  His electrolytes seem to be      intact, although his magnesium is pending at the time of dictation.      Again, he had morphologic criteria suggestive of ventricular tachycardia      on his EKG and his rhythm broke with lidocaine.  Currently,  he is being      treated with lidocaine 1 mg per minute which we will continue for now.      We will cycle cardiac enzymes to rule out significant infarct.  He does      have borderline troponin at this time with an MB that is negative.  The      patient will be given  heparin and it would be advisable for him likely      to general endotracheal tube anesthesia  cardiac catheterization given      his history and his presentation. He would also be deemed an ICD      candidate based on sustained ventricular tachycardia in a patient with      significant left ventricular dysfunction and ejection fraction 35% by      last evaluation and known coronary artery disease.  2.  Coronary artery disease.  We will start the patient on beta-blocker in      addition to his antiarrhythmic lidocaine currently. He is on an ACE      inhibitor and rest of his medical therapy as appropriate.  Will check a      lipid panel to insure 20 mg of Lipitor is an adequate dose.  Will place      the patient on telemetry during this hospitalization and continue to      monitor for other potential arrhythmias.  3.  Leukocytosis.  Patient does not have any localizing signs of infection,      nor does history reveal anything suggestive of a possible infection in      the respiratory tract or urinary tract on questioning.  Will repeat his      white blood cell count for right now and not start any antibiotics as he      does not appear to be in any acute distress from this.  This may be a      stress response and we are going to continue to sort this out during the      course of his hospitalization.       KLT/MEDQ  D:  12/21/2004  T:   12/22/2004  Job:  [id number]   cc:   Arturo Morton. Riley Kill, M.D. [facility]  [address]  [address]  [place]  [place] [address]   Dr. Kinnie Feil, [place]

## 2010-10-11 NOTE — Discharge Summary (Signed)
NAME:  [name], [name]            ACCOUNT NO.:  [id number]   MEDICAL RECORD NO.:  [id number]          PATIENT TYPE:  INP   LOCATION:  3728                         FACILITY:  [facility]   PHYSICIAN:  Lonzo Cloud. Kriste Basque, M.D. [facility] OF BIRTH:  08/11/1935   DATE OF ADMISSION:  08/19/2005  DATE OF DISCHARGE:  08/21/2005                                 DISCHARGE SUMMARY   FINAL DIAGNOSES:  1.  Admitted August 19, 2005 from the office with severe dizziness, weakness      and diaphoresis of uncertain etiology.  This was associated with vague      chest discomfort and palpitations.  Cardiology consultation Dr. Jens Som      with nothing being found-medications adjusted this admission.  2.  History of three-vessel coronary artery disease with previous coronary      artery bypass grafting in 2003.  He had ejection fraction of 35% at that      time.  His last catheterization in July 2006 showed ejection fraction of      45% and the saphenous vein graft to posterior descending artery did not      fill and the left internal mammary artery to the left anterior      descending was atretic.  3.  Ischemic cardiomyopathy followed in the [facility]      by Dr. Antoine Poche.  4.  History of cardiac arrhythmias with an implanted automatic implantable      cardioverter/defibrillator and recent ventricular tachycardia storm with      multiple discharges-Dr. Ladona Ridgel instituted amiodarone therapy and plans      follow-up is automatic implantable cardioverter/defibrillator.  5.  History of hypertension-controlled on medications.  6.  History of renal insufficiency with recent elevated creatinine and      potassium level.  Medications adjusted with improvement and a creatinine      back to baseline 1.9 range.  7.  History of diabetes mellitus-controlled on oral agents.  8.  History of chronic obstructive pulmonary disease with recent      exacerbation leading prednisone at present.  9.  History of  arteriosclerotic peripheral vascular disease with previous      percutaneous transluminal coronary angioplasty to the right superficial      femoral artery for an ABI of 0.36.  He has known bilateral carotid      bruits; his Dopplers are followed regularly by the vascular lab.  10. History of colon polyps with last colonoscopy four to five years ago by      Dr. Kinnie Scales.  11. History of urinary tract infection and renal insufficiency as noted.   BRIEF HISTORY AND PHYSICAL:  The patient is a 75 year old gentleman known to  me with recent hospitalization here from March 2 to July 29, 2005-please see  that discharge summary for details.  He has a COPD and asthmatic bronchitis,  history of severe arteriosclerotic heart disease with V-tach and implanted  defibrillator, congestive heart failure and hypertension all followed by Dr.  Antoine Poche.  He presented to the office on August 19, 2005 for an urgent add-on  admission because of  severe dizziness, weakness and diaphoresis of one days'  duration.  He felt it was just like the last time before my pacer fired.  he had a vague chest discomfort with some irregularity sensed along with  dizziness but no syncope.  He was last seen in the office by Dr. Antoine Poche  and on July 22, 2005 and was last hospitalized from March 2 to July 29, 2005 as noted.  When he was seen in the office for post hospital follow-up  on August 05, 2005 he was improved.  He was admitted to rule out MI and for  further cardiac evaluation.   PAST MEDICAL HISTORY:  See recent discharge summary.   PHYSICAL EXAMINATION:  Physical examination revealed a 75 year old gentleman  in mild distress with anxiety.  Blood pressure 130/70, pulse 92 per minute  and regular, respirations 18 per minute, slightly shallow temperature 98  degrees.  HEENT exam revealed him to be slightly pale.  No exudates seen.  Neck exam showed carotid bruit.  No jugular venous distension, no  thyromegaly or  lymphadenopathy.  Chest exam revealed a few scattered rhonchi  but no wheezing, rales or signs of consolidation.  Cardiac exam revealed a  regular rhythm, a grade 1/6 systolic ejection murmur and a S4 gallop.  Abdominal exam was soft and nontender with minimal epigastric discomfort on  palpation.  No evidence organomegaly or masses.  Normal bowel sounds.  Extremities showed no cyanosis, clubbing or edema.  Neurologic exam was  intact without focal abnormalities detected.   LABORATORY DATA:  EKG showed sinus bradycardia, interventricular conduction  delay, poor R-wave progression and nonspecific ST-T wave changes.  Hemoglobin 10.5, hematocrit 30.7, white count 8,900 with 79% segs.  Sed rate  10.  Pro time 12.9, INR 1.0, PTT 25 seconds.  Sodium 137, potassium 5.9  (repeat 5.2), chloride 106, CO2 27, BUN 41, creatinine 2.3, blood sugar 92,  calcium 9.2, total protein 6.6, albumin 4.0, AST 35, ALT 27, alk phos 27,  total bilirubin 0.7, serum protein electrophoresis pending at the time of  this dictation.  CPK 100 with negative MB and negative troponin.  TSH 4.42.  Serum iron studies also pending and will be followed up in the office.   HOSPITAL COURSE:  The patient was admitted with weakness, dizziness,  diaphoresis of uncertain etiology.  He had some vague discomfort and has  history of severe cardiomyopathy and arteriosclerotic heart disease.  Consultation was obtained from the cardiology service and he was seen by Dr.  Jens Som.  His cardiac enzymes were negative and his symptoms rapidly  resolved in the hospital.  Dr. Jens Som felt his medication should be  adjusted and coordinated with the [facility] under the  direction of Dr. Antoine Poche and Dr. Ladona Ridgel for his arrhythmias.  It was  determined that he should be on amiodarone 200 mg a day, Toprol 100 mg a  day, enalapril 10 mg a day, along with his Lipitor, Tricor and other medications.  His previous Avapro and Lasix  were placed on hold and he will  not be taking these at present.  The patient was ambulated on the ward and  did satisfactory.  His medical issues including his blood sugar were  monitored and with a brief course of IV Solu-Medrol (he had stopped his p.o.  prednisone several days prior to admission) resulted in some increase in his  blood sugar which was controlled with sliding scale Humalog.  He was placed  back on  10 mg prednisone prior to discharge and will be followed up in the  office.  In addition, it was determined he was mildly anemic and evaluation  was undertaken including serum iron studies B12 and serum protein  electrophoresis and this will be followed up in the office.   MEDICATIONS AT DISCHARGE:  1.  Prednisone 20 mg tablets 1/2 tablet p.o. q.a.m.  2.  Mucinex 1-2 tablets p.o. b.i.d. with plenty of fluids.  3.  Advair 500/50 1 inhalation twice a day.  4.  Xopenex metered-dose inhaler 2 sprays every 6 hours as needed for      wheezing.  5.  Amiodarone 200 mg 1 tablet a day.  6.  Toprol XL 200 1/2 tablet p.o. q.d. until return.  7.  Enalapril 10 mg p.o. q.d.  8.  Lipitor 20 mg p.o. q.d.  9.  Fenofibrate 200 mg p.o. q.d.  10. Glucovance 250/1.25 1 tablet p.o. b.i.d. at breakfast and dinner.  11. He was instructed to stop his previous Avapro and Lasix for the time      being and will follow up in the office with me on Thursday, August 28, 2005 at 1 p.m.  We will be sure he has an appointment with Dr. Antoine Poche      in the [facility] Clinic after that.      Lonzo Cloud. Kriste Basque, M.D. [facility]  Electronically Signed     SMN/MEDQ  D:  08/21/2005  T:  08/22/2005  Job:  [id number]   cc:   Rollene Rotunda, M.D.  [address]  [address]  [place]  [place] [address]   Patient's chart

## 2010-10-11 NOTE — Discharge Summary (Signed)
[name], [name]            ACCOUNT NO.:  [id number]   MEDICAL RECORD NO.:  [id number]          PATIENT TYPE:  INP   LOCATION:  2032                         FACILITY:  [facility]   PHYSICIAN:  Lonzo Cloud. Kriste Basque, M.D. [facility] OF BIRTH:  [birth year]   DATE OF ADMISSION:  07/25/2005  DATE OF DISCHARGE:  07/29/2005                                 DISCHARGE SUMMARY   FINAL DIAGNOSIS:  1.  Admitted July 25, 2005, with acute exacerbation of chronic bronchitis      characterized by increased cough, yellow sputum production, progressive      dyspnea, and fever.  Patient treated with intravenous Solu-Medrol,      intravenous Avelox, and inhaled bronchodilators with gradual      improvement.  The patient being discharged on p.o. prednisone and a      maximum bronchodilator regimen.  2.  Ventricular tachycardia storm with discharges from his implanted      cardioverter/defibrillator.  Consultation with cardiology and Dr. Sharrell Ku with institution of amiodarone therapy.  3.  History of chronic obstructive pulmonary disease and asthmatic      bronchitis.  He is an ex-smoker.  Recent exacerbation as noted.  4.  Three vessel coronary artery disease status post coronary artery bypass      grafting in 2003.  He had an ejection fraction of 35% at that time.  His      last catheterization July 2006 showed ejection fraction of 45% and the      saphenous vein graft to the posterior descending artery did not fill and      the left internal mammary artery to the left anterior descending  was      atretic.  5.  History of cardiac arrhythmia with implanted AICD device as noted.  6.  History of congestive heart failure.  7.  History of hypertension.  8.  History of diabetes mellitus - controlled on oral agents.  9.  History of hypercholesterolemia - controlled on statins.  10. History of arteriosclerotic peripheral vascular disease with previous      percutaneous angioplasty to the right superficial  femoral artery for an      ABI of 0.36.  He also has known bilateral carotid bruits; and Dopplers      followed regularly the vascular lab.  11. History of colon polyps.  12. History of urinary tract infection and renal insufficiency with      creatinine 1.7 to 2 range.   BRIEF HISTORY AND PHYSICAL:  The patient is a 75 year old white gentleman  with multiple medical problems as noted above.  He was recently seen in the  office with an acute exacerbation of chronic bronchitis and treated with  oral Avelox, Medrol, Mucinex, and inhaled Advair.  The patient states he is  no better despite therapy and he noted the onset of fever, chills and sweats  the night prior to admission with increasing cough, yellow sputum  production, and no hemoptysis.  He has had progressive shortness of breath  and dyspnea on exertion and on the morning of admission, noted several  severe shocks from his implanted AICD and came to the emergency room for  evaluation.  In the ER, he was seen by Dr. Lynelle Doctor who did a chest x-ray which  showed early right lower lobe pneumonia.  He was admitted for further  evaluation in cardiac consultation.   PAST MEDICAL HISTORY:  COPD and asthmatic bronchitis with recent acute  exacerbation as noted.  He is an ex-smoker.  He has a history of three  vessel coronary artery disease with previous coronary bypass grafting in  2003.  At that time, is ejection fraction was 35%.  He is followed regularly  by cardiology and his last catheterization was in July 2006 where his  saphenous vein graft to the obtuse marginal was intact but the graft to the  posterior descending artery did not fill.  In addition, the LIMA to the LAD  was atretic.  His ejection fraction at that time was 45%.  As noted, he has  a history of ischemic cardiomyopathy with congestive heart failure and had  an implanted AICD device for ventricular tachycardia.  He has had no  discharges from the device until the morning  of admission.  He has a history  of hypertension controlled medication.  He has diabetes mellitus controlled  on oral agents.  He has a history arteriosclerotic peripheral vascular  disease with previous percutaneous angioplasty to the right superficial  lateral artery for an ABI of 0.36.  He has known bilateral carotid bruits  and has regular Doppler studies done in the vascular lab.  He has a history  of colon polyps and is followed by GI.  He has a history of urinary tract  infection and mild renal insufficiency with creatinine 1.7 to 2 range.  The  patient states he recently started feeling worse when his Toprol was  increased from 100 to 150 and subsequently to 200 by Dr. Antoine Poche in the  congestive heart failure clinic.   PHYSICAL EXAMINATION:  Examination of admission revealed a 75 year old  gentleman in no acute distress but slight diaphoresis noted.  Vital signs  with blood pressure 124/72, pulse 110 and regular, respirations 22 per  minute but not labored, temperature 102 degrees.  HEENT exam revealed  slightly pale, no exudates seen.  Neck exam showed no jugular venous  distension, faint carotid bruits, no thyromegaly or lymphadenopathy.  Chest  exam revealed bilateral rhonchi with a few rales at the right base but no  signs of consolidation.  He had cough productive of thick yellow phlegm  without hemoptysis.  Cardiac exam revealed a regular rhythm with grade 1/6  systolic ejection murmur at the left sternal border and S4 gallop.  There  were no rubs.  Abdomen was soft and nontender, slightly distended, normal  bowel sounds, no evidence of organomegaly or masses.  Extremities showed no  cyanosis, clubbing or edema.  Pulses were somewhat diminished.  Neurologic  exam was intact without focal abnormalities detected.  Skin exam was  negative.   LABORATORY DATA:  EKG showed sinus rhythm and incomplete left bundle branch block pattern, nonspecific ST-T wave changes.  His  defibrillator was  interrogated by cardiology and results are available in the chart.  Chest x-  ray showed COPD and a right basilar infiltrate.  Follow-up films showed  improvement in the right base with some residual atelectasis.  Hemoglobin  11.4, hematocrit 33, white count 16,100 with 83% segs.  Sed rate 114.  Protime 14., INR 1.1. PTT 48 seconds.  Sodium  133, potassium 4.8, chloride  103, CO2 26, BUN 30, creatinine 2.3, blood sugar 269.  Serial blood sugars  and renal function were followed throughout the hospital course.  BUN 41,  creatinine 1.7 prior to discharge, blood sugar 167.  Calcium 8.2, total  protein 6.2, albumin 2.8, AST 27, ALT 18, alk phos 55, total bilirubin 0.7.  Hemoglobin A1C 7.2.  CPK 320 with negative MB and borderline troponin.  Lipid profile showed a cholesterol 128, triglyceride 210, HDL low at 12, LDL  74.  TSH 1.62.  Urinalysis negative.  Blood cultures negative x2.   HOSPITAL COURSE:  Problem 1:  Acute aspects chronic bronchitis.  As noted, the patient was  placed on IV Avelox and IV Solu-Medrol, inhaled bronchodilators with Xopenex  and Atrovent, given mucolytic agents in the form of Mucinex and encouraged  to drink plenty of fluids.  He was covered with low flow oxygen, as well.  The patient improved slowly on this regimen.  We were able to wean the IV  Solu-Medrol in favor of oral prednisone and wean the IV Avelox in favor of  the oral form.  He used an incentive spirometer and flutter valve device to  aid in mucociliary clearance.  His follow-up x-ray showed improvement.  He  was felt to be ready for discharge on July 29, 2005.  He will be on a  regimen of oral prednisone, finish out a course of Zithromax, and be  diligent with taking Mucinex, Advair, and Xopenex metered dose inhaler.  We  held off on using a home nebulizer for the time being.  In follow-up, we  will recheck his x-ray blood count and sed rate.   Problem 2:  Diabetes.  As noted, Mr.  Degroote has adult onset diabetes  controlled on Glucovance 250/1.25 twice a day at home.  With the IV Solu-  Medrol, his blood sugar increased during the hospitalization.  He was  covered with sliding scale Humalog insulin during the hospitalization and  restarted on his Glyburide prior to discharge.  It was felt that he could  safely restart his Glucovance at home plus his diabetic diet and his numbers  will be followed up as an outpatient to assess the need to increase his  medication and possibly add another agent.   Problem 3:  Arteriosclerotic heart disease.  As noted, he has  arteriosclerotic heart disease with ischemic cardiomyopathy and had V-tach  storm on admission.  The patient was seen by the cardiac team and Dr. Ladona Ridgel  initiated Amiodarone therapy.  He was loaded in the hospital and discharged  on 400 mg a day with instructions to wean to 200 mg a day.  We will do outpatient pulmonary function studies and follow him carefully for this.  Dr. Ladona Ridgel has set up appointments for him to have the defibrillator checked  in April and will follow up with Dr. Ladona Ridgel on April 17.   DISCHARGE MEDICATIONS:  Zithromax 5 mg p.o. q.d. till gone (5 days),  prednisone 20 mg tablets, 2 tablets p.o. q.a.m. until return office visit,  Mucinex 600 mg tablets, 2 tablets p.o. b.i.d. with plenty of fluids. Advair  500/50 1 inhalation twice a day.  Xopenex meter dose inhaler 2 puffs four  times a day.  Toprol XL 100 1 tablet p.o. q.d.  Amiodarone 2 mg tablets, 2  tablets p.o. q.a.m. until March 20 and then decrease to 1 tablet daily until  return visit.  Avapro 150 mg p.o. daily.  Lipitor 20 mg p.o. q.d.  Tricor  145 mg p.o. q.d.  Glucovance 250/1.25, 1 tablet p.o. b.i.d.   DISPOSITION:  The patient will follow up to the office on Tuesday, August 05, 2005, at 1:45 p.m.      Lonzo Cloud. Kriste Basque, M.D. [facility]  Electronically Signed     SMN/MEDQ  D:  07/30/2005  T:  07/30/2005  Job:  [id number]

## 2010-10-11 NOTE — Assessment & Plan Note (Signed)
[facility]                                 ON-CALL NOTE   NAME:[name], [name]                   MRN:          [id number]  DATE:07/11/2006                            DOB:          [birth year]    CALLER:  Berniece Andreas.   I spoke with them twice by phone on this date, July 11, 2006.  Initial visit/call this morning was concerning Mr. Ballowe's  defibrillator device.  Ms. Morocho stated it was making a beeping  sound.  I instructed her to hang up and call the [employer], as  this was the type of device he had.   On further evaluation, he has a [employer] Maximo that had just been  checked by [place] recently.  Ms. Duhe stated that she would check  with the device company and then call me back.  She called me back and  said they were of no assistance and that it was still beeping.  I  instructed her to go ahead and bring him to the emergency room to have  it interrogated.  I spoke with Dr. Ladona Ridgel, who agreed that the patient's  device needs to be interrogated.  I then called Feliz Beam with [employer]  and asked him to meet patient here in the emergency room to have the  device interrogated.      Dorian Pod, ACNP  Electronically Signed      Doylene Canning. Ladona Ridgel, MD  Electronically Signed   MB/MedQ  DD: 07/11/2006  DT: 07/11/2006  Job #: [id number]

## 2010-10-11 NOTE — Discharge Summary (Signed)
[place]  Patient:    ISRAEL, WERTS Visit Number: [id number] MRN: [id number]          Service Type: SUR Location: 2000 2015 01 Attending Physician:  Tressie Stalker Dictated by:   Maxwell Marion, RNFA Admit Date:  07/05/2001 Discharge Date: 07/10/2001   CC:         Lonzo Cloud. Kriste Basque, M.D. [facility]  Rollene Rotunda, M.D. [facility]  Velora Heckler, M.D.   Discharge Summary  DATE OF BIRTH:  March 28, 2036  ADMISSION DIAGNOSIS:  Coronary artery disease.  PAST MEDICAL HISTORY: 1. Coronary artery disease, status post abnormal stress test in 1989 followed    by cardiac catheterization. 2. COPD. 3. Longstanding history of tobacco use, quit in September of 2002. 4. Colonic mass, resection pending.  ALLERGIES:  The patient has no known drug allergies.  DISCHARGE DIAGNOSES: 1. Three-vessel coronary artery disease, status post coronary artery bypass    grafting. 2. Peripheral vascular occlusive disease. 3. New onset diabetes mellitus, type 2.  BRIEF HISTORY:  Mr. Brandenburg is a 75 year old Caucasian man followed by Lonzo Cloud. Kriste Basque, M.D.  He was noted to have heme-positive stools on screening and was felt for follow-up testing.  He was noted to have an abnormal colonoscopy and barium enema.  He was referred to Velora Heckler, M.D., for consideration of colonic resection.  During his preoperative evaluation, he had an abnormal EKG.  He was then referred to Rollene Rotunda, M.D., who evaluated him in his office on July 01, 2001.  His recommendation was for cardiac catheterization.  HOSPITAL COURSE AND PROCEDURES:  On July 05, 2001, Mr. Yom was admitted to [facility] under the care of Rollene Rotunda, M.D., and underwent a cardiac catheterization.  This catheterization revealed three-vessel coronary artery disease, including left main disease and a total occlusion of his right coronary artery.  He had moderate left  ventricular dysfunction.  A cardiac surgery consult was obtained with Salvatore Decent. Cornelius Moras, M.D.  After examination of the patient and review of the records, including the catheterization films, he recommended coronary artery bypass grafting as the preferred treatment choice for this gentleman.  This plan was discussed with the patient and he agreed to proceed.  Preoperatively he also underwent Doppler studies which revealed no significant coronary artery disease.  His ABIs were noted to be abnormal at 0.36 on the right and 0.76 on the left.  He also had preoperative pulmonary function tests which revealed severe COPD.  On July 06, 2001, Mr. Schultes underwent an uncomplicated coronary artery bypass grafting x 4 with Salvatore Decent. Cornelius Moras, M.D.  Grafts placed at the time of the procedure were left internal mammary artery to the left anterior descending artery, saphenous vein graft to the circumflex artery, and saphenous vein grafted in sequential fashion to the posterior descending artery and the right posterolateral artery.  Vein was harvested for the bypass grafts from the left thigh.  Mr. Brodrick tolerated the procedure well and was transferred in stable condition to the SICU.  Mr. Loughmiller has remained hemodynamically stable since surgery and his postoperative course has been uneventful.  He is progressing well and recovering from surgery.  It is anticipated that he will be ready for discharge home tomorrow, July 10, 2001.  LABORATORY DATA:  On July 05, 2001, lipid profile with total cholesterol 185, triglycerides 263, and LDL 103.  The hemoglobin A1C was noted to be 6.7.  On July 08, 2001, the hemoglobin was 8.5  and hematocrit 24.1. Potassium 4.1, BUN 17, creatinine 1.0.  CONDITION ON DISCHARGE:  Improved.  DISCHARGE INSTRUCTIONS:  Instructions on discharge include medications, activity, diet, wound care, and follow-up appointments.  Please see the discharge  instruction sheet for details.  DISCHARGE MEDICATIONS: 1. Enteric-coated aspirin 325 mg p.o. q.d. 2. Tylox one to two p.o. q.4-6h. p.r.n. for pain. 3. Lopressor 25 mg p.o. q.12h. 4. Altace 2.5 mg p.o. q.d. 5. Combivent MDI two puffs b.i.d. 6. He has been instructed to resume his home medication of Pravachol 40 mg    p.o. q.h.s.  FOLLOW-UP: 1. He was seen in the hospital by the diabetes coordinator and she is    arranging outpatient diabetes education. 2. He has an appointment to see Rollene Rotunda, M.D., at his office on    July 22, 2001.  He will have a chest x-ray taken at that time. 3. He has an appointment to see Salvatore Decent. Cornelius Moras, M.D., at the [facility] office on    Monday, August 02, 2001, at 10:30 a.m. Dictated by:   Maxwell Marion, RNFA Attending Physician:  Tressie Stalker DD:  07/09/01 TD:  07/10/01 Job: [id number]

## 2010-10-11 NOTE — Discharge Summary (Signed)
   NAME:  [name], [name]                      ACCOUNT NO.:  [id number]   MEDICAL RECORD NO.:  [id number]                   PATIENT TYPE:  INP   LOCATION:  0469                                 FACILITY:  [facility]   PHYSICIAN:  Sean A. Everardo All, M.D. [facility]           DATE OF BIRTH:  1935-06-17   DATE OF ADMISSION:  12/10/2002  DATE OF DISCHARGE:  12/11/2002                                 DISCHARGE SUMMARY   DISCHARGE DIAGNOSES:  1. Intractable vomiting, uncertain etiology, resolved.  2. Type 2 diabetes.  3. Other chronic medical problems as noted in the history and physical.   REASON FOR ADMISSION:  Intractable vomiting.   HISTORY OF PRESENT ILLNESS:  The patient is a 75 year old man admitted by me  on December 10, 2002, with intractable vomiting.  Please refer to my dictated  history and physical for details.   HOSPITAL COURSE:  The patient was admitted and treated with intravenous  fluids as well as p.r.n. Zofran.  He improved, and by the morning of December 11, 2002, was stating that his nausea had resolved.   Regarding his diabetes, his Decadron was discontinued, as there was no clear  indication for its ongoing use.  He was given Amaryl 4 mg daily, but his  glucose's were in the mid 100s.  It was felt that off the Decadron, he  probably would need less Amaryl.  This was all despite the fact that he was  in the hospital for only slightly less then 24 hours, so his proper dosage  could not be determined with certainty.  He will follow up with Dr. Kriste Basque  soon regarding the adequacy of the Amaryl dosage.   By the morning of December 11, 2002, the patient was alert and oriented and  eating his usual diet, ambulatory, and requesting to go home, and was thus  discharged home in good condition.   DISCHARGE MEDICATIONS:  1. Amaryl 2 mg daily.  2. Zofran 4 mg q.6h. p.r.n. nausea.  3. Keflex 500 mg t.i.d., as prescribed by dentist, until gone.  4. Toprol XL 50 mg daily.  5. Altace 5 mg daily.  6. Clarinex 5 mg daily.  7. Lipitor 20 mg daily.  8. Bextra 20 mg daily.  9. Percocet 5/500 mg, continued as needed for pain, as prescribed by     dentist.   ACTIVITY:  No restriction on activity.    DIET:  A reasonable diet is advised.   FOLLOWUP:  With Dr. Kriste Basque within 30 days.                                               Sean A. Everardo All, M.D. [facility]    SAE/MEDQ  D:  12/11/2002  T:  12/11/2002  Job:  [id number]

## 2010-10-11 NOTE — Cardiovascular Report (Signed)
NAME:  [name], [name]            ACCOUNT NO.:  [id number]   MEDICAL RECORD NO.:  [id number]          PATIENT TYPE:  INP   LOCATION:  2927                         FACILITY:  [facility]   PHYSICIAN:  Arvilla Meres, M.D. [facility] OF BIRTH:  1936-01-31   DATE OF PROCEDURE:  12/23/2004  DATE OF DISCHARGE:                              CARDIAC CATHETERIZATION   PRIMARY CARE PHYSICIAN:  Dr. Alroy Dust.   CARDIOLOGIST:  Dr. Rollene Rotunda and Dr. Bonnee Quin.   PATIENT IDENTIFICATION:  [name] is a delightful 75 year old male with  a history of coronary disease which was discovered in 2003 during a  preoperative evaluation for possible colon resection. At that time, he was  noted to have a 70% left main as well as 70-80% left circumflex lesion and a  totaled RCA. He subsequently underwent bypass grafting with LIMA to the LAD,  saphenous vein graft to the OM and reported saphenous vein graft jumped from  PDA to PL. He was admitted recently with dyspnea and ventricular tachycardia  with rates of greater than 200. He had just a very minor troponin elevation  at 0.63 with an MB of 4.5 and was thus referred for cardiac catheterization.  He denies any chest pain.   PROCEDURES PERFORMED:  1.  Selective coronary angiography.  2.  Left heart catheterization.  3.  Left ventriculogram.  4.  Saphenous vein graft angiography.  5.  LIMA angiography.   DESCRIPTION OF PROCEDURE:  The risks and benefits of the catheterization  were explained to [name]; consent was signed and placed on the chart.  A 6-French arterial sheath was placed in the right femoral artery using  modified Seldinger technique. Standard catheters including JL-4, JR-4 and  angled pigtail were used for the procedure. There is no apparent  complications. All catheter changes were made over wire.   Central aortic pressure is 123/69 with a mean of 91. LV pressure was  119/12/25. There is no gradient on aortic valve  pullback.   CORONARY ANATOMY:  Left main was long. There was a 40-50% distal lesion.   LAD was a long vessel that wrapped the apex. In the mid section, there was  an intramyocardial segment with no evidence of significant stenosis. There  was some mild luminal irregularities in the distal vessel.   The left circumflex was a small to moderate-sized system. There was a tiny  ramus and a large branching OM1 which made up the predominant portion of the  left circumflex. In left circumflex proper, there was an 80% proximal  lesion.   The right coronary artery: The right coronary artery was totally occluded  proximally which was chronic.   The LIMA to the LAD was atretic.   The saphenous vein graft to the OM was widely patent and back filled most of  the left circumflex system.   Saphenous vein sequential graft to the PDA and PL was widely patent;  however, there was no evidence of the PDA filling. There was good flow in a  distal PL.   Left ventriculogram done in the RAO position showed an EF of 45% with  inferior basilar akinesis with an aneurysm. There was some mild MR due to  ectopy; however, when this resolved, there did not appear to be any  significant mitral regurgitation.   ASSESSMENT:  1.  Significant two-vessel coronary disease as above. The left internal      mammary artery to the left anterior descending artery.  2.  The left internal mammary artery to left anterior descending artery is      atretic; however, there is good left main and left anterior descending      artery flow.  3.  Left circumflex is highly diseased in the proximal segment with a patent      graft to the obtuse marginal.  4.  The right coronary artery is totaled proximally but the saphenous vein      graft to the posterolateral is widely patent. There is no evidence of      posterior descending artery filling.   DISCUSSION:  [name] appears to have stable revascularization without  any  evidence of a culprit lesion. Once again the LIMA is atretic but there  is good flow through the left main LAD.   PLAN:  1.  EP study versus ICD for his ventricular tachycardia as per EP      recommendations.  2.  Medical therapy for his coronary artery disease.  3.  Continue risk factor management.       DB/MEDQ  D:  12/23/2004  T:  12/23/2004  Job:  [id number]   cc:   Lonzo Cloud. Kriste Basque, M.D. [facility]

## 2010-10-11 NOTE — Discharge Summary (Signed)
NAME:  [name], [name]            ACCOUNT NO.:  [id number]   MEDICAL RECORD NO.:  [id number]          PATIENT TYPE:  INP   LOCATION:  2024                         FACILITY:  [facility]   PHYSICIAN:  Maple Mirza, P.A. DATE OF BIRTH:  1935-10-19   DATE OF ADMISSION:  12/21/2004  DATE OF DISCHARGE:  12/28/2004                                 DISCHARGE SUMMARY   DISCHARGE DIAGNOSES:  1.  Discharging day #1, status post implantation of Medtronic Maximo VR [id number]      cardioverter-defibrillator with acceptable defibrillator threshold on      testing.  2.  Admitted July 29th with 24 hours of dyspnea on exertion, found to have      sustained monomorphic, ventricular tachycardia in the emergency room.      No response to Adenosine challenge, dysrhythmia broke with Lidocaine.  3.  Induced ventricular tachycardia at 150 beats per minute.  Patient will      have increased Toprol and exercise stress test with the ICD turned off      to make sure that his sinus rhythm is less than 130.  4.  Fever and dysuria with finding of Klebsiella pneumoniae urinary tract      infection (this is a recurrent urinary tract infection) on prolonged      Cipro therapy.  5.  Blood culture x1 is positive for Staphylococcus coagulase-negative.  The      patient has no foreign bodies, and this is one culture only out of two      positive.  6.  Current superficial gum infection at dental post implant.  Dental      specialist assessment equals okay to place cardioverter-defibrillator.      Patient has been on Chlorhexidine rinse for 36 hours prior to the      procedure.  7.  Infectious Disease consult.  Agree with Cipro for urinary tract      infection.  Agree that one blood culture for Staphylococcus coagulase-      negative species without continued fever and without foreign body is not      demonstrative and recommending dental specialist assessment for      superficial gum infection.   SECONDARY DIAGNOSES:  1.   History of severe, three-vessel coronary artery disease, status post      coronary artery bypass graft surgery, February 2003.  At that      catheterization prior to the surgery, ejection fraction was 35%.  2.  Left heart catheterization, December 23, 2004, ejection fraction 45%.  The      saphenous vein graft to the obtuse marginal was patent.  The sequential      saphenous vein graft to the PDA not filling, with end to the PLV as      patent.  The LIMA to the LAD is atretic from competitive flow in the      LAD.  3.  Class II congestive heart failure.  4.  Diabetes mellitus.  5.  Hypertension.  6.  Dyslipidemia.  7.  Chronic obstructive pulmonary disease.  8.  Infrainguinal arterial occlusive disease, status post PTCA of  the right      superficial femoral artery for an ankle-brachial index of 0.36.  9.  Status post polypectomy.  10. Right groin bruit, post catheterization on July 31st.  Ultrasound      negative for pseudoaneurysm.  11. Bilateral carotid bruits.  12. Induced ventricular tachycardia during electrophysiology study of 150      beats per minute.   PLAN:  1.  Increase Toprol to 150 mg daily from 50 mg daily.  2.  Exercise-gaited stress test on Toprol with the ICD turned off.  The      patient on Toprol with exercise should have sinus tachycardia less than      130.  This is to avoid interference with induced ventricular tachycardia      at electrophysiology study of 150 beats per minute.  3.  Visit to Dr. Kriste Basque, his primary caregiver, for a urology visit.  This      appointment has been made.  4.  Home three weeks on ciprofloxacin 500 mg b.i.d.  5.  Home on Lasix 40 mg daily and potassium 20 mEq daily.   DISCHARGE DISPOSITION:  Mr. Fornwalt is discharging August 5th, day one  after implantation of Medtronic cardioverter-defibrillator.  He has had no  complications at the incision site.  He is maintaining a sinus rhythm at the  time of discharge.  There are PVCs.  His  superficial gum infection is much  better on a Chlorhexidine rinse.  He no longer has dysuria since being  started on Cipro.  He has been afebrile since onset on July 30th, which at  the time of discharge was six days ago.  As a result of left heart  catheterization as dictated above, there is no evidence of a culprit lesion.  The LIMA is atretic, but there is good flow through the left main into the  LAD.  The stress will be on risk factor modification and medical therapy for  his coronary artery disease.  The patient will maintain on Cipro for a  further three weeks.  Mr. Wendorff is cautioned not to drive for the next  six months until February of 2007, and he is asked to keep his incision dry  for the next seven days until Friday, August 11th.   DISCHARGE DIET:  A low sodium and low cholesterol diet.   PAIN MANAGEMENT:  Tylenol 325 mg one to two tabs every 4 to 6 hours.   ADDITIONAL DISCHARGE MEDICATIONS:  1.  Toprol-XL 100 mg tablets, one-and-one-half tab daily.  This is a new      dose.  2.  Lipitor 20 mg daily at bedtime.  3.  Enteric-coated aspirin 81 mg daily.  4.  Tricor 145 mg daily.  5.  Advair Discus 250/50 two puffs daily.  6.  Altace 2.5 mg daily.  7.  Lasix 40 mg daily, a new medication.  8.  Potassium chloride 20 mEq daily, a new medication.  9.  Cipro 500 mg, one tablet in the morning and one tablet in the evening      for the next three weeks, also new.  10. He is to resume Glucovance 1.25 mg/250 mg twice daily.  He is to start      this only on Monday, August 7th.   FOLLOWUP:  1.  He is to call Dr. Theora Gianotti office, his oral dental surgeon, to schedule a      followup visit.  2.  He is to see Dr. Lorin Picket  Kriste Basque on Monday, December 30, 2004, about referral      to a urologist.  3.  Presents to the [facility] Clinic at [facility], [address] Monday, January 13, 2005, at 9:15 in the morning. 4.  He has an exercise stress test at [facility]  to wear sneakers      and comfortable clothes, Tuesday, January 28, 2005, at 10:15 in the      morning.  5.  He will see Dr. Graciela Husbands, Tuesday, April 08, 2005, at 9:20 in the      morning.   BRIEF HISTORY:  Mr. Curro is a 76 year old male.  He has a history of  coronary artery disease.  He underwent coronary artery bypass graft surgery  in February of 2003.  He has known ischemic cardiomyopathy with an ejection  fraction of 35% at catheterization prior to the surgery.   He has done well since his coronary artery bypass.  He has had Adenosine-  Cardiolite study in June of 2004.  It showed ejection fraction of 35% with  no ischemia and an inferobasilar infarct.  On Friday morning of July 28th of  this year, he helped load pallets onto a truck.  It was heavy, dusty work.  The patient was perspiring freely.  At 6 o'clock in the evening day, he  became aware of progressive dyspnea.  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  He had  been scheduled to golf on Saturday morning but felt tired and laid about all  day.  He felt more short of breath lying down during Saturday, the 29th,  than he did sitting up.  He did not have chest pain, no presyncope, or  syncope.  He presented to the emergency room on Saturday evening at around 6  o'clock after 24 hours of feeling short of breath and found to be in a wide-  complex tachycardia.  This was not responsive to Adenosine challenge.  Hemodynamics were stable, however.  IV lidocaine was added, with conversion  to sinus tachycardia at about 8 o'clock, two hours after arriving in the  emergency room.  The patient at this examination remains in sinus rhythm and  no further shortness of breath.  Cardiac risk factors include hypertension,  dyslipidemia, diabetes, and age.  Cardiac risk equivalents are known  coronary artery disease and diabetes diagnosed about a year ago.  The plan  will be to  have a left heart catheterization and if no revascularization  done, then the patient will be on schedule for a cardioverter-defibrillator  placement.   HOSPITAL COURSE:  The patient presented with 24 hours of dyspnea on exertion  and shortness of breath with lying down.  He was found to have  hemodynamically stable monomorphic ventricular tachycardia in the emergency  room.  This broke with IV lidocaine.  He has had no sustained monomorphic  ventricular tachycardia, not even nonsustained ventricular tachycardia since  admission.  Because of this, he was scheduled for a left heart  catheterization, with known coronary artery disease.  This was done on July  31st.  The study has been dictated above but shows that his grafts placed  from the obtuse marginal and also a sequential graft to the PDA and then to  the posterolateral branch are patent.  His LIMA to the LAD was atretic secondary to competitive flow in the native LAD.  His ejection fraction was  45%.  He had a bruit at the right groin catheterization site, and an  ultrasound was obtained that showed no evidence of pseudoaneurysm or AV  fistula.  The patient did have fever on hospital day #2 and complained of  dysuria.  Urine culture showed a Klebsiella pneumoniae urinary tract  infection and the patient was started on ciprofloxacin.  This is a recurrent  urinary tract infection for this patient.  Because of this infection and  because one blood culture showed Staphylococcus coag-negative organisms,  Infectious Disease consult was obtained.  After a thorough evaluation, it  was recommended that he maintain on Cipro for two to three weeks after  discharge and that he should get a Urology consult.  In addition, it was  brought to light that the patient had recent evidence of a superficial gum  infection at a dental post implant, which was placed about one year ago.  Prior to this admission, his dentist had recommended that he see his  dental  oral specialist for an evaluation of this.  A Dental consult was obtained  prior to placement of the ICD and it was recommended, after looking at an  orthopantogram that was nonspecific, that his dental specialist come to see  the patient, and, indeed, his specialist did see the patient on the evening  prior to implantation of the ICD and his assessment was that the procedure  could go forward.  The patient had been maintained on 36 hours of  chlorhexidine oral mouthwash rinse prior to ICD placement and will remain on  Cipro for three weeks after ICD placement.  The cardioverter-defibrillator  was placed August 4th, the patient discharging post-procedure day #1, with  followup and medications as dictated.       GM/MEDQ  D:  12/27/2004  T:  12/28/2004  Job:  [id number]   cc:   [name], M.D.   Lonzo Cloud. Kriste Basque, M.D. [facility]   Grant Ruts., D.D.S.  Fax: [phone]

## 2010-10-11 NOTE — Consult Note (Signed)
[name], [name]            ACCOUNT NO.:  [id number]   MEDICAL RECORD NO.:  [id number]          PATIENT TYPE:  EMS   LOCATION:  MAJO                         FACILITY:  [facility]   PHYSICIAN:  Doylene Canning. Ladona Ridgel, MD    DATE OF BIRTH:  [birth year]   DATE OF CONSULTATION:  07/11/2006  DATE OF DISCHARGE:                                 CONSULTATION   Consultation requested after [name] called in and complained that  his defibrillator was making a beeping sound.   HISTORY OF PRESENT ILLNESS:  The patient is a 75 year old male with  known coronary disease status post bypass surgery in early 10s.  He has  LV dysfunction.  He is in his usual state of health until earlier today  when he noted beeping sound which he felt was emanating from his  defibrillator site.  The patient underwent ICD implantation in August  2006 with a Medtronic Maximow placed at that time with a [employer] Fidelis  lead.  He has had no problems so far with device malfunction.  He denies  any chest pain or shortness of breath and denies peripheral edema.  He  was planning to play golf today.  His past medical history is also noted  for hypertension.   SOCIAL HISTORY:  The patient is married.  He denies tobacco or ethanol  abuse.   FAMILY HISTORY:  Is noncontributory.   REVIEW OF SYSTEMS:  Is negative, all systems reviewed.   PHYSICAL EXAMINATION:  GENERAL:  He is a pleasant well-appearing 70-year-  old man in no acute distress.  VITAL SIGNS:  The blood pressure was 130/85, the pulse 70 and regular,  respirations were 18.  HEENT:  Normocephalic, atraumatic.  Pupils equal and round.  Oropharynx  moist.  Sclerae anicteric.  NECK:  Revealed no jugular distension, no thyromegaly.  Trachea is  midline.  Carotid 2+ and symmetric.  LUNGS:  Clear bilaterally auscultation.  No wheezes, rales or rhonchi.  No increased work of breathing.  HEART:  Regular rhythm with normal S1-S2 no murmurs, rubs, gallops.  ABDOMINAL:   Soft, nontender, nondistended, no organomegaly.  EXTREMITIES:  Demonstrate no cyanosis, clubbing or edema.  Pulses were  2+ and symmetric.  Defibrillator site was healed nicely.   Interrogation of his defibrillator demonstrated a Medtronic Maximow.  The R waves were 7, Impedance 480 ohms, threshold was volt at 0.2.  The  electrograms demonstrated episodes of SVT and several nonsustained  tachycardic episodes.  He was 99.9% sensed, less than 0.1% V-paced.  Impedance was 480 ohms.   IMPRESSION:  Status post Medtronic ICD with report of the device  alarming with interrogation of device demonstrating normal function and  no early coupled R intervals indicative of lead fracture and no change  in pacing  or sensing functions.  Today we have given the patient the sounds for  the alert which suggest a lead problem.  He will continue on with his  present medical therapy and follow-up with Dr. Graciela Husbands and Dr. Antoine Poche as  previously scheduled.      Doylene Canning. Ladona Ridgel, MD  Electronically Signed  GWT/MEDQ  D:  07/11/2006  T:  07/11/2006  Job:  [id number]

## 2010-10-11 NOTE — Consult Note (Signed)
[place]. [facility]  Patient:    [name], [name] Visit Number: [id number] MRN: [id number]          Service Type: CAT Location: 2000 2029 01 Attending Physician:  Rollene Rotunda Dictated by:   Salvatore Decent. Cornelius Moras, M.D. Proc. Date: 07/05/01 Admit Date:  07/05/2001   CC:         Dr. Rollene Rotunda, M.D.  Dr. Alroy Dust, M.D.   Consultation Report  REFERRING PHYSICIAN:  Dr. Rollene Rotunda, M.D., [facility].  PRIMARY CARE PHYSICIAN:  Dr. Alroy Dust, M.D., [facility].  GENERAL SURGEON:  Dr. Darnell Level, M.D.  REASON FOR CONSULTATION:  Left main disease, three-vessel coronary artery disease with moderate left ventricular dysfunction.  HISTORY OF PRESENT ILLNESS:  [name] is a 75 year old white male with known history of coronary artery disease as well as hyperlipidemia, COPD, previous heavy history of tobacco use, and a strong family history of coronary artery disease. The patient was recently evaluated by Dr. Darnell Level because of history of heme positive stool with abnormal colonoscopy and barium enema. The specific results of these tests are not available, but apparently elective colectomy was recommended for a presumed benign condition, with the possibility of occult malignancy. As part of the patients preoperative evaluation a 12-lead electrocardiogram was performed and noted to be abnormal. The patient was referred to Dr. Kriste Basque for surgical clearance and subsequently cardiology consultation was obtained with Dr. Antoine Poche. Because of the patients previous history of coronary artery disease with the abnormal stress test in the past, elective cardiac catheterization was recommended. [name] underwent elective cath today by Dr. Antoine Poche. This demonstrates left main disease with three-vessel coronary artery disease. Cardiac surgical consultation is requested.  REVIEW OF SYSTEMS:  CARDIAC:  The patient specifically denies any history of symptoms  of chest pain, chest discomfort or exertional chest pressure, or nausea. He does suffer from exertional shortness of breath. He quit smoking last fall and prior to this he suffered from frequent episodes of paroxysmal nocturnal dyspnea. These seemed to have improved following his cessation of tobacco use. He continues to have a mild, dry, nonproductive cough. He denies any symptoms of palpitations, syncope, or near syncopal episodes.  GENERAL:  The patient reports otherwise being well. He has gained approximately 10 pounds in weight since he quit smoking. He lives a relatively sedentary lifestyle and does not feel particularly limited in any respect.  RESPIRATORY:  The patient reports exertional shortness of breath as described and well as a dry, nonproductive cough. He denies any problems with wheezing or a history of hemoptysis.  GASTROINTESTINAL:  The patient reports his bowel function is stable. He has had some grossly heme positive stool in the past which initially led to his further GI evaluation. He denies problems with obstipation, constipation, diarrhea.  NEUROLOGIC:  Negative. The patient denies symptoms of transient numbness or weakness involving either upper or lower extremity.  MUSCULOSKELETAL:  Essentially negative. The patient does develop occasional cramps in both hands.  GENITOURINARY:  Negative. The patient denies problems with hematuria, dysuria, or urinary frequency.  INFECTIOUS:  Negative. The patient denies problems with fevers or chills.  HEMOLOGIC:  Negative. The patient denies problems with frequent epistaxis, easy bruising, or other bleeding ______ .  ENDOCRINE:  Negative. The patient denies any known history of glucose intolerance or tendency for diabetes.  PSYCHIATRIC:  Negative.  PERIPHERAL VASCULAR:  The patient does report chronic complaints of claudication in both calf muscles which is worse on the right  side than the left. The pains are  brought on with ambulation and typically relieved with rest.  HEENT:  Negative. The patient wears upper dentures. He denies any loose teeth or other problems in his mouth. He denies any recent visual changes. He denies problems with chronic headaches.  PAST MEDICAL HISTORY:  Notable for coronary artery disease. The patient had abnormal stress test in 1989 and underwent cardiac catheterization at that time. The patient was noted to have inferior wall hypokinesis with mildly reduced left ventricular ejection fraction at that time and chronically occluded right coronary artery. There apparently was no significant disease involving the left main, left anterior descending, or left circumflex systems. The patient denies any previous hospitalization for presumed myocardial infarction or episode of congestive heart failure. The patient denies any known history of hypertension. The patient reports being on cholesterol lowering agents in the past, although he is unsure of his current lipid status. He denies any history of diabetes. He denies any history of previous stroke. The patient is known to have chronic obstructive pulmonary disease. The patient has history of colonic mass associated with heme positive stool for which he planned to undergo elective colectomy at some point in the future. The patient has been told that this likely will be benign and related to chronic diverticular disease, although the possibility of occult malignancy remains an issue. The patient has never had any clinically significant GI bleeding episodes and never required blood transfusion in the past. The patient has never had previous surgical procedure performed.  FAMILY HISTORY:  Notable for a strong presence of coronary artery disease. The patients parents both died of myocardial infarctions, Father at age 2 and Mother at age 59. The patient had a brother who died of myocardial infarction at age 52.   SOCIAL  HISTORY:  The patient currently lives alone. He is married but is currently separated from his wife. He has two daughters who are both grown and two grandchildren. The family is supportive. He has a history of heavy tobacco abuse smoking between 1 1/2 and 2 packs of cigarettes per day for more than 50 years. He quit smoking altogether in September of 2002. The patient also is a reformed alcoholic and reports he has not had a drink in over a month.  MEDICATIONS PRIOR TO ADMISSION:  None.  DRUG ALLERGIES:  Sensitivities: None known.  PHYSICAL EXAMINATION:  GENERAL:  Notable for a well-appearing white male who appears his stated age in no acute distress.  HEENT:  Essentially within normal limits.  NECK:  Supple. There are no cervical or supraclavicular lymphadenopathy. There are no jugular venous distention. No carotid bruits are noted.  CHEST:  Auscultation of the chest reveals a few bibasilar inspiratory crackles. No wheezes or rhonchi are noted.  CARDIOVASCULAR:  Demonstrates regular rate and rhythm. No murmurs, rubs, or gallops are noted.  ABDOMEN: Soft, mildly obese, and nontender. There are no palpable masses.  EXTREMITIES:  Warm, well-profused. There is no lower extremity edema. Distal pulses are not palpable in either lower leg. There is no venous insufficiency.  NEUROLOGIC:  Grossly nonfocal and symmetrical throughout.  SKIN:  Clean and dry and intact throughout.  The remainder of his physical exam is unrevealing.  DIAGNOSTIC TESTS:  Cardiac catheterization performed today by Dr. Antoine Poche is reviewed. This demonstrates distal 70% stenosis of the left main coronary artery. There is 70% stenosis of the mid left circumflex artery which gives rise to a large circumflex marginal branch. There is 100%  proximal occlusion of the right coronary artery with right-to-right bridging collaterals and moderate diffuse disease in the distal portion of the right coronary  artery. There is right dominant coronary circulation. There is moderate left ventricular dysfunction with ejection fraction estimated 35-40%. There is inferior wall akinesis. There is no mitral regurgitation.  IMPRESSION:  Left main disease with three-vessel coronary artery disease and moderate left ventricular dysfunction. I believe that [name] from elective coronary bypass grafting. Associated ______ conditions include - severe chronic obstructive pulmonary disease, peripheral vascular disease with bilateral cath claudication, a previous history of heavy tobacco use, and history of heavy alcohol use.  [name] and his family understand and accept all associated risks of surgery including, but not limited to, risk of death, stroke, myocardial infarction, respiratory failure, bleeding requiring blood transfusion, arrhythmia, infection, a recurrent coronary artery disease. They understand the indications and potential benefits of coronary artery bypass grafting. All their questions have been addressed. Alternative treatment strategies have been discussed. We tentatively plan to proceed with surgery on Tuesday, July 06, 2001. Dictated by:   Salvatore Decent Cornelius Moras, M.D. Attending Physician:  Rollene Rotunda DD:  07/05/01 TD:  07/05/01 Job: [id number]

## 2010-10-11 NOTE — Op Note (Signed)
[place]. [facility]  Patient:    [name], [name] Visit Number: [id number] MRN: [id number]          Service Type: CAT Location: 2300 2315 01 Attending Physician:  Tressie Stalker Dictated by:   Salvatore Decent. Cornelius Moras, M.D. Proc. Date: 07/06/01 Admit Date:  07/05/2001   CC:         Rollene Rotunda, M.D. [facility] M. Kriste Basque, M.D. [facility]  Velora Heckler, M.D.  CVTS office   Operative Report  PREOPERATIVE DIAGNOSIS:  Left main disease, three-vessel coronary artery disease with moderate left ventricular dysfunction.  POSTOPERATIVE DIAGNOSIS:  Left main disease, three-vessel coronary artery disease with moderate left ventricular dysfunction.  PROCEDURE:  Median sternotomy for coronary artery bypass grafting x 4 (left internal mammary artery to the distal left anterior descending coronary artery, saphenous vein graft to circumflex marginal branch, saphenous vein graft to posterior descending coronary artery, and sequential saphenous vein graft to right posterolateral branch).  SURGEON:  Salvatore Decent. Cornelius Moras, M.D.  ASSISTANT:  Areta Haber, P.A.  ANESTHESIA:  General.  BRIEF CLINICAL NOTE:  The patient is a 75 year old white male with history of coronary artery disease, COPD, previous history of heavy tobacco abuse, and a family history of coronary artery disease.  The patient is followed by  Dr. Alroy Dust.  The patient recently was undergoing preoperative evaluation for possible elective colon resection by Dr. Darnell Level.  The patient was noted to have abnormal electrocardiogram.  The patient was referred back to Dr. Kriste Basque and subsequently to Dr. Antoine Poche, M.D. for cardiac evaluation.  Elective cardiac catheterization performed by Dr. Antoine Poche demonstrates 70% stenosis of the left main coronary artery with 100% chronic occlusion of the right coronary artery.  There is moderate left ventricular dysfunction with left ventricular ejection fraction  estimated at 35%.  A full consultation note has been dictated previously.  OPERATIVE CONSENT:  The patient and his family have been counseled at length regarding the indications and potential benefits of coronary artery bypass grafting.  They understand and accept all associated risks of surgery including but not limited to risks of death, stroke, myocardial infarction, bleeding requiring blood transfusion, arrhythmia, infection, and recurrent coronary artery disease.  All of their questions have been addressed.  OPERATIVE NOTE:  The patient is brought to the operating room on the above mentioned date and invasive hemodynamic monitoring was established by the anesthesia service under the care and direction of Dr. Judie Petit. Specifically, a Swan-Ganz catheter was placed through the right internal jugular approach.  A right radial arterial line is placed.  Intravenous antibiotics are administered.  The patient is placed in the supine position on the operating table.  Following induction with general endotracheal anesthesia, a Foley catheter is placed.  Transesophageal echocardiogram is performed by Dr. Randa Evens and also assisted by Dr. Jacklynn Bue.  This demonstrates moderate left ventricular dysfunction with global hypokinesis and inferior wall akinesis.  There is mild mitral regurgitation.  The aortic valve appears normal.  No other significant abnormalities are identified.  The patients chest, abdomen, both groins, and both lower extremities are prepared and draped in a sterile manner.  Median sternotomy incision is performed and the left internal mammary artery is dissected from the chest wall and prepared for bypass grafting.  The left internal mammary artery is notably good quality conduit.  Simultaneously, the saphenous vein is obtained from the patients left thigh through a series of longitudinal incisions.  The saphenous vein is notably good quality  conduit.  The patient is  heparinized systemically.  The pericardium is opened.  The ascending aorta is normal in appearance.  The ascending aorta and the right atrium are cannulated for cardiopulmonary bypass.  Adequate heparinization is verified.  Cardiopulmonary bypass is begun and the surface of the heart is inspected.  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  There is moderate left ventricular dilatation and mild left ventricular hypertrophy.  Portions of saphenous vein and the left internal mammary artery are all trimmed to appropriate lengths.  Temperature probe is placed in the left ventricular septum and a styrofoam pad is placed to protect the left phrenic nerve from thermal injury.  A cardioplegia catheter is placed in the ascending aorta.  The patient was cooled to 32 degrees systemic temperature.  The aorta crossclamp is applied and cardioplegia is delivered initially in the antegrade fashion through the aortic root.  Iced saline slush is applied for topical hypothermia.  The initial cardioplegia arrest and myocardial cooling are felt to be satisfactory.  Repeat doses of cardioplegia are administered intermittently throughout the crossclamp portion of the operation.  Flow through the aortic root and down subsequently place vein graft to maintain temperature below 15 degrees Centigrade.  The following distal coronary anastomoses are performed:  1. Posterior descending coronary artery is grafted with a saphenous vein graft    in a side-to-side fashion.  This distal anastomosis is placed immediately    after takeoff of this artery from the distal right coronary artery.  At    this level, this coronary measured 1.5 mm in diameter and is of fair     quality.  2. The posterolateral branch off the distal right coronary artery is grafted    using a sequential saphenous vein graft off  the vein placed to the    posterior descending coronary artery.  This coronary measures 1.4 mm at    the site of distal bypass and is of fair quality.  3. The circumflex marginal branch is grafted with a saphenous vein graft in    the end-to-side fashion.  This coronary measures 1.7 mm in diameter and is    of good quality at the site of the distal bypass.  4. The distal left anterior descending coronary artery is grafted with the    left internal mammary artery using an end-to-side fashion.  This coronary    measures 2.0 mm in diameter and is of good quality at the site of distal    bypass.  After completion of this anastomosis, the septal temperature is    noted to rise following reperfusion of the left internal mammary artery,    although the rate of rise is felt to be somewhat sluggish.  Therefore, an    additional dose of cardioplegia is administered and the internal mammary    artery graft is reoccluded.  The distal anastomosis is taken down and    carefully inspected.  There is no sign of any mechanical problems with the    distal anastomosis and a coronary probe easily passes in all directions.    The distal anastomosis is again performed in the end-to-side fashion and    in routine fashion.  The septal temperature is again noted to rise    appropriately, although somewhat sluggishly after reperfusion of the left    internal mammary artery.  Note that there was excellent forward flow  through the internal mammary artery immediately prior to completion of the    distal anastomosis.  Both proximal saphenous vein anastomoses are performed directly to the ascending aorta prior to removal of the aortic crossclamp.  All air is evacuated from the aortic root and the patient is placed in Trendelenburg position.  The aortic crossclamp is removed after total crossclamp time of 109 minutes.  The heart begins to beat spontaneously without need for cardioversion.  After cardiac pacing  wires are affixed to the right ventricular outflow tract into the right atrial appendage.  The patient is rewarmed to greater than 37 degrees C temperature.  All proximal and distal anastomoses are inspected for hemostasis and appropriate graft orientation.  The patient was weaned from cardiopulmonary bypass without difficulty.  The patients rhythm at separation from bypass   is initially sinus rhythm with long first degree AV block and bradycardia requiring dual chamber AV sequential pacing.  However, normal sinus rhythm resumes promptly after separation from bypass and pacing is discontinued.  The patient is weaned from cardiopulmonary bypass on low-dose dopamine infusion.  Total cardiopulmonary bypass time for the operation is 139 minutes.  Follow up transesophageal echocardiogram performed followed separation from bypass demonstrates no significant change in the patients left ventricular function.  There remains only mild mitral regurgitation.  No other abnormalities are noted.  The patients cardiac output and hemodynamics remain stable throughout the remainder of the operation.  The venous and arterial cannulas are both removed uneventfully.  Protamine is administered to reverse the anticoagulation.  The mediastinum and the left chest are irrigated with saline solution containing vancomycin.  Meticulous surgical hemostasis is ascertained.  Mediastinum in the left chest are drained with three chest tubes placed through  separate incisions inferiorly.  The median sternotomy is closed in routine fashion.  The left lower extremity incision is closed in multiple layers in the routine fashion.  All skin incisions are closed with subcuticular skin closures.  The patient tolerated the procedure well and is transported to the surgical intensive care unit in stable condition.  There are no intraoperative complications.  All sponge, instrument and needle counts are verified correct at the  completion of the operation.  No blood products were administered.  2. Dictated by:   Salvatore Decent. Cornelius Moras, M.D. Attending Physician:  Tressie Stalker DD:  07/06/01 TD:  07/06/01 Job: [id number]

## 2010-10-11 NOTE — Procedures (Signed)
[place]. [facility]  Patient:    [name], [name] Visit Number: [id number] MRN: [id number]          Service Type: DSU Location: 2000 2041 01 Attending Physician:  Veneda Melter Dictated by:   Veneda Melter, M.D. [facility] Proc. Date: 08/06/01 Admit Date:  08/06/2001 Discharge Date: 08/07/2001   CC:         Dr. Verne Spurr M. Kriste Basque, M.D. [facility]; Vascular Lab   Procedure Report  PROCEDURES PERFORMED: 1. Abdominal aortogram. 2. Bilateral lower extremity angiogram. 3. Percutaneous transluminal angioplasty of the right superficial    femoral artery.  DIAGNOSES: 1. Peripheral vascular disease. 2. Claudication with rest pain.  HISTORY:  [name] is a 75 year old white male with multiple risk factors for cardiovascular disease who recently underwent coronary artery bypass graft surgery in February 2003 for advanced three vessel disease.  He has had some claudication for some time that has recently progressed to pain with minimal activity and occasionally at rest.  Preoperative ankle brachial indices were 0.36 on the right and 0.76 on the left.  Due to progression of symptoms, he presents for further assessment.  PROCEDURE:  Technique and informed consent was obtained and the patient was brought to the peripheral vascular lab.  A 6-French sheath was placed in the left femoral artery using modified Seldinger technique.  A 5-French pigtail catheter was advanced into the abdominal aorta and abdominal aortogram was performed using power injection contrast.  A left lower extremity angiogram was then performed using power injection contrast through the left femoral sheath.  Using an IMA catheter positioned across the iliac bifurcation, an old hand-held catheter was positioned in the right external iliac artery and the right lower extremity angiogram was performed using power injection contrast.  INITIAL FINDINGS: 1. Abdominal aorta is of normal  caliber.  There was moderate to severe    thrombus buildup with no system greater than 30 percent.  The renal    arteries are single and widely patent bilaterally. 2. Left lower extremity: The left common iliac artery has moderate disease    of 50 percent.  The external common femoral arteries have moderate diffuse    disease of 30 percent.  Superficial femoral artery is patent with diffuse    disease of 30-40 percent.  The popliteal artery is patent with mild    irregularities.  Trifurcation vessels are patent with moderate to diffuse    disease of 30-40 percent. 3. Right lower extremity: The right common iliac artery has mild disease of    20-30 percent.  The external iliac artery has a long narrowing of    50 percent extending into the common femoral artery.  The superficial    femoral artery has moderate disease of 50 percent in the proximal mid    section.  There is, then, a short segment occlusion in the distal    superficial femoral artery.  The distal SSA and popliteal artery are seen    to fill the reconstitution from collaterals.  Trifurcation vessels are    patent.  The anterior tibial artery has a severe narrowing to 70 percent    of the proximal segment.  PROCEDURE:  These findings were reviewed with the patient.  We would like to proceed with percutaneous intervention of the right superficial femoral artery.  Using a Wholey wire positioner and cross iliac bifurcation, a 6-French bulky sheath was positioned in the right external iliac artery and selective guide shunt pertaining to  this superficial femoral artery.  The patient was given heparin intravenously maintaining atmospheres of 225 seconds.  Using an end-hold catheter and angled guide wire, the distal occlusion was probed and we were able to successfully advance the end-hold catheter into the distal vessel.  A 5 x 8 paraflex balloon was then introduced and a single inflation performed in the distal left FA at 6  atmospheres for 120 seconds.  Repeat angiography was then performed showing excellent result with mild residual narrowing of 30 percent and significantly improved flow through the superficial femoral artery.  Repeat angiography was then performed of the right lower extremity showing straight line flow to the ankle and no evidence of distal thromboembolic phenomenon.  This was deemed an acceptable result.  The bulky sheath was retracted and exchanged for a short 6-French sheath.  This was secured into position and the patient was then transferred to the floor in stable condition.  The sheath will be removed when the ACT returns to normal.  He tolerated the procedure well.  FINAL RESULTS:  Successful percutaneous transluminal angioplasty of the right superficial femoral artery with reduction of 100 percent narrowing to less than 30 percent using a 5 mm balloon with lesion length approximately 4 cm. Dictated by:   Veneda Melter, M.D. [facility] Attending Physician:  Veneda Melter DD:  08/06/01 TD:  08/09/01 Job: [id number]

## 2010-10-11 NOTE — Assessment & Plan Note (Signed)
[facility]                           ELECTROPHYSIOLOGY OFFICE NOTE   NAME:Fisch, ELIMELECH HOUSEMAN                   MRN:          [id number]  DATE:04/06/2006                            DOB:          04-13-1936    Mr. Keisling was seen today in the clinic on April 06, 2006 for followup  of his Medtronic model [phone] Maximo. Date of implant was December 27, 2004. He  was actually in because of the recall lead that he has of [id number] and I did  reprogramming according to the protocol. His battery voltage is 3.17 with a  charge time of 7.31 seconds. R waves measured 6.3 millivolts with a  ventricular pacing threshold of 1 volt at 0.2 msec and a ventricular lead  impedance of 512. Shock impedance was 49. There were 11 nonsustained  episodes, no other episodes noted since last interrogation. The changes per  protocol were made and he will continue with his [facility] transmissions and  a return office visit in July 2008.      Altha Harm, LPN  Electronically Signed      [name], MD, [facility]  Electronically Signed   PO/MedQ  DD: 04/06/2006  DT: 04/06/2006  Job #: [id number]

## 2010-10-11 NOTE — Consult Note (Signed)
NAME:  VEARL, ALLBAUGH NO.:  [id number]   MEDICAL RECORD NO.:  [id number]          PATIENT TYPE:  INP   LOCATION:                               FACILITY:  [facility]   PHYSICIAN:  Charlynne Pander, D.D.S.DATE OF BIRTH:  [birth year]   DATE OF CONSULTATION:  12/26/2004  DATE OF DISCHARGE:                                   CONSULTATION   DENTAL CONSULTATION   Natalie Leclaire is a 75 year old male referred by Dr. Lewayne Bunting for a  dental consultation.  The patient was admitted with a history of ventricular  tachycardia with anticipated ICD placement in the future.  The patient  subsequently evaluated and found to have some exudate coming from an upper  right quadrant implant.  Dental consultation was requested to evaluate this  implant for infection and to determine whether the placement of the ICD  should be withheld at this time.   MEDICAL HISTORY:  1.  Ventricular tachycardia.      1.  Anticipated placement of an ICD.  2.  Ischemic cardiomyopathy.  3.  Coronary artery disease.      1.  Status post coronary artery bypass graft procedure in February of          2003.  4.  Diabetes mellitus.  5.  Hypertension.  6.  Hypercholesterolemia.  7.  History of peripheral vascular disease.   ALLERGIES:  NONE KNOWN.   MEDICATIONS:  1.  Zocor 40 mg every evening.  2.  Aspirin 81 mg daily.  3.  Tricor 145 mg daily.  4.  Toprol-XL 50 mg daily.  5.  Advair one puff twice daily.  6.  Reglan.  7.  Insulin per sliding scale.  8.  Chlorhexidine rinses 10 mL four times daily.  9.  Altace 2.5 daily.  10. Lasix 40 mg every morning.  11. Ciprofloxacin 500 mg twice daily.  12. Vancomycin per protocol.   SOCIAL HISTORY:  The patient lives in [place] with his wife.  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  The  patient has two daughters, both who live locally.  The patient has a remote  tobacco use of 70-pack-year history, but  quit smoking in 2002.  The patient  denies use of alcohol at this time.   FAMILY HISTORY:  Noncontributory.   REVIEW OF SYSTEMS:  Reviewed from the chart and health history assessment  form--this admission.   FUNCTIONAL ASSESSMENT:  The patient remains independent for ADLs at this  time.   DENTAL HISTORY:  Chief complaint:  Dental consultation and request we  evaluate an upper quadrant implant for possible infection.   HISTORY OF PRESENT ILLNESS:  The patient gives a history of possible peri-  implantitis.  The patient was evaluated approximately two weeks ago by his  general dentist (Dr. Marjean Donna).  The patient was seen for exam and  cleaning and subsequently found to have possible loss of osseo- integration  of the upper right quadrant implant in the area of the first or second  molar.  This implant was found to have  some purulent exudate coming from  around the implant during this admission.  The patient was to be seen by D.  Gwendlyn Deutscher (oral surgeon), who placed the implant in 2005.  The patient  subsequently developed the heart arrhythmia and, therefore, did not follow  up with the oral surgeon.  Primary concern from the cardiology team is to  determine whether placement of the ICD needs to be delayed until the  infection and the implant is evaluated and treated appropriately.   The patient currently denies any pain or discomfort associated with the  upper right quadrant implant.  The patient indicates that Dr. Ave Filter felt  that it did need to be evaluated by Dr. Manson Passey.  The patient denies mobility  of the implant at this time.  The patient indicates that there may be some  purulence associated with it from time-to-time.  The patient has  chlorhexidine rinses prescribed, but has not used as of date of this  consultation.  The patient usually seeks regular dental care on a two to  three times a year basis.  The patient was recently evaluated by Dr.  Ave Filter and had a  root canal therapy placed on tooth #29 along with a  temporary crown with final crown cementation pending.   DENTAL EXAMINATION:  GENERAL:  The patient is a well-developed well-  nourished male in no acute distress.  VITAL SIGNS:  Blood pressure 126/78, pulse 102, respirations 20, temperature  99.1.  HEAD AND NECK EXAM:  There is no palpable lymphadenopathy.  There are no  acute TMJ symptoms.  INTERNAL EXAM:  The patient has normal saliva.  There is no significant  intraoral swelling or abscess formation noted within the mouth.  The tissues  around the implant in the area of #3 appears to be slightly edematous, but  no purulence is noted at this time.  The patient indicates that the upper  complete denture which fits over the implants is stable and retentive.  DENTITION:  The patient is missing all maxillary teeth and has implants in  the area of tooth #3, 8, 12 and 14.  The patient has also missing tooth #'s  17, 18, 19, 30, 31 and 32.  PERIODONTAL:  Patient with chronic periodontitis with plaque accumulations  that are minimal.  There is no significant mobility of the implants or teeth  at this time with my limited examination.  I was unable to probe the implant  in the area of #3 at this time.  DENTAL CARIES:  There are no obvious dental caries noted at this time.  ENDODONTIC:  There are no obvious periapical radiolucencies.  The panoramic  x-ray reveals some slight darkening around the implant and this may  represent incipient peri-implantitis.  CROWN AND BRIDGE:  There are multiple crown and bridge restorations which  appear to be clinically acceptable.  PROSTHODONTIC:  Patient with upper complete denture which is implant  retained.  OCCLUSION:  The occlusion of the upper denture and lower teeth appears to be  acceptable.   RADIOGRAPHIC INTERPRETATION:  Panoramic x-ray was taken in the department of  radiology December 26, 2004.  The patient has multiple missing teeth.  The  maxillary teeth have been  replaced with four implants in the area of teeth #3, 8, 12 and 14  approximately.  The patient has multiple crown or bridge restorations which  appear to be clinically acceptable.  The patient has had a previous root  canal therapy to tooth #29  with a temporary crown currently as a temporary  restoration.  There are no significant periapical radiolucencies noted at  this time.  There is some incipient loss of bone around implant #3 area  which may represent incipient peri-implantitis.   ASSESSMENT:  1.  Questionable peri-implantitis around the implant in the area of #3.  2.  Chronic periodontitis of bone loss.  3.  Plaque accumulations--minimal.  4.  Multiple missing teeth.  5.  Clinically acceptable upper complete denture.  6.  No obvious significant periapical radiolucencies at this time.  7.  History of root canal therapy to tooth #29 with no obvious persistent      periapical pathology or symptoms.   PLAN/RECOMMENDATIONS:  1.  I discussed the risks, benefits, and complications of various treatment      options with the patient in relationship to his medical and dental      conditions.  We discussed having Dr. Gwendlyn Deutscher evaluate the implant      in the upper right quadrant in the area of #3 at this time.  Dr. Gwendlyn Deutscher will then provide the final opinion as to whether he feels that      the placement of the ICD can proceed as planned.  Dr. Manson Passey will offer      opinions as to whether additional antibiotic therapy or other surgical      intervention is required at this time.  Most likely, the patient will be      allowed to proceed with ICD placement at this time with subsequent      evaluation and treatment by Dr. Gwendlyn Deutscher as an Outpatient once the      patient is medically stable.  In the meantime, the patient is to      continue use of chlorhexidine rinses four times daily as indicated.  2.  Discussion of findings with Dr. Gwendlyn Deutscher and will assist in      coordination of his evaluation of the patient in the hospital.  3.  The patient is to contact dental medicine if other acute dental problems      arise during the remainder of this admission.       RFK/MEDQ  D:  12/27/2004  T:  12/28/2004  Job:  [id number]   cc:   Doylene Canning. Ladona Ridgel, M.D.   Grant Ruts., D.D.S.  Fax: [phone]

## 2010-10-11 NOTE — Procedures (Signed)
[place]  Patient:    [name], [name] Visit Number: [id number] MRN: [id number]          Service Type: MED Location: 2300 2315 01 Attending Physician:  Tressie Stalker Dictated by:   Judie Petit, M.D. Admit Date:  07/05/2001                             Procedure Report  DATE OF BIRTH:  11/29/1935  INDICATION:  [name] is a 75 year old male who presents today for coronary artery bypass grafting by Dr. Purcell Nails.  A TEE will be utilized for pre- and post-coronary artery bypass grafting.  PROCEDURE:  PRE-CARDIOPULMONARY TRANSESOPHAGEAL ECHOCARDIOGRAM.  ANESTHESIOLOGIST:  Judie Petit, M.D.  DESCRIPTION OF PROCEDURE:  The patient was brought to the holding area on the morning of surgery.  A pulmonary artery catheter and A-line catheter were inserted under local anesthesia without difficulty.  The patient was moved to the operating room for routine induction of general anesthesia.  The trachea was intubated.  Following intubation of the trachea, the TEE probe was passed oropharyngeally into the stomach and then withdrawn for imaging of the cardiac structures.  Left ventricle:  There was moderate left ventricular dysfunction.  Papillary muscles were well-outlined.  No masses were noted within the left ventricular chamber.  Aortic valve:  Aortic valve appeared trileaflet in nature.  There was no significant aortic regurgitant flow by Doppler examination.  Mitral valve:  The valve appeared to function appropriately.  On Doppler examination, there was mild regurgitant flow.  There was no reversible flow on examination.  Left atrium:  The appendage was able to visualized.  No masses were noted. The atrial septum appeared intact.  Right ventricle:  The right ventricle appeared normal.  The tricuspid valve was grossly normal.  The right atrium appeared normal.  The patient was placed on cardiopulmonary bypass by  Dr. Cornelius Moras.  The coronary artery bypass grafting was performed and the patient was subsequently rewarmed and separated from cardiopulmonary bypass with the initial attempt.  CARDIOPULMONARY BYPASS TRANSESOPHAGEAL ECHOCARDIOGRAM PER LIMITED EXAMINATION: Left ventricle:  In the early bypass period, the left ventricle chamber revealed low volume, however, with replacement of adequate volume, the contractile pattern improved.  There was still evidence of mild mitral regurgitant flow on Doppler examination.  The rest of the cardiac examination was as previously described. Dictated by:   Judie Petit, M.D. Attending Physician:  Tressie Stalker DD:  07/06/01 TD:  07/07/01 Job: [id number]

## 2010-10-11 NOTE — Consult Note (Signed)
NAME:  [name], COLGLAZIER            ACCOUNT NO.:  [id number]   MEDICAL RECORD NO.:  [id number]          PATIENT TYPE:  INP   LOCATION:  3728                         FACILITY:  [facility]   PHYSICIAN:  Olga Millers, M.D. [facility] OF BIRTH:  Mar 30, 1936   DATE OF CONSULTATION:  08/19/2005  DATE OF DISCHARGE:                                   CONSULTATION   HISTORY OF PRESENT ILLNESS:  [name] is a pleasant 75 year old male  patient of Dr. Graciela Husbands and Dr. Antoine Poche with a past medical history of  coronary artery disease status post coronary artery bypass graft, ischemic  cardiomyopathy, status post ICD, COPD, diabetes mellitus, hypertension,  renal insufficiency, hyperlipidemia, and peripheral vascular disease who we  are asked to evaluate for diaphoresis and dizziness. The patient was  recently admitted to [facility] with asthmatic bronchitis.  He was  treated with antibiotics with improvement in his symptoms.  He also had  ventricular tachycardia storm with discharges from his ICD.  He was seen by  Dr. Ladona Ridgel and was initiated on amiodarone.  It should be noted that he was  discharged on amiodarone as well.  Also of note, his hemoglobin at the time  of discharge was 12.2.  His BUN and creatinine most recently on 08/05/05 was  66 and 3.0 with potassium of 7.2.  He has had baseline renal insufficiency.  Since discharge, the patient has done reasonably well.  He does have  additional exertion but there is no orthopnea, PND, pedal edema,  palpitations, or syncope.  He has noted episodes of dizziness. These occur  while he is standing but are not related to sudden positional changes.  There is no weakness in his extremities or loss of sensation, nor is there  dysarthria.  There is no associated palpitations, chest pain, or shortness  of breath.  He has also had episodes of diaphoresis that improves sometimes  with p.o. intake.  He was seen in Dr. Jodelle Green office today and had  diaphoretic episode and was sent to the emergency room.  We were asked to  further evaluate.   MEDICATIONS:  1.  Mucinex.  2.  Aspirin 325 mg p.o. daily.  3.  Lipitor 20 mg p.o. q.h.s.  4.  Enalapril 10 mg p.o. daily.  5.  Glyburide.  6.  Metformin.  7.  Avapro 100 mg p.o. daily.  8.  Toprol 100 mg p.o. daily.  9.  Lasix 40 mg p.o. daily.  10. Amiodarone 200 mg p.o. daily.  11. Etodolac 400 mg p.o. daily.  12. Fenofibrate.   ALLERGIES:  No known drug allergies.   SOCIAL HISTORY:  He has a remote history of tobacco use but does not smoke  at present.  He does not consume alcohol.   FAMILY HISTORY:  Positive for coronary artery disease.   PAST MEDICAL HISTORY:  Significant for hypertension, diabetes mellitus, and  hyperlipidemia.  He has a history of COPD/asthma.  He also has a history of  coronary artery disease status post coronary artery bypass grafting with a  LIMA to the LAD, saphenous vein graft to the first marginal,  and a saphenous  vein graft to the PD and posterolateral.  He has had a prior ICD placed as  well.  He has a history of peripheral vascular disease.  He has had prior  PTA of his right superficial femoral artery.   REVIEW OF SYSTEMS:  He denies any headaches, fevers, or chills.  There is no  productive cough or hemoptysis.  There is no dysphagia or odynophagia,  melena, or hematochezia.  There is no dysuria or hematuria.  There is no  seizure activity.  There is no orthopnea, PND, or pedal edema.  The  remaining symptoms are negative.   PHYSICAL EXAMINATION:  VITAL SIGNS:  Blood pressure 129/71 in the standing  position, 196/54 in the lying position, and 129/51 in the sitting position.  His pulse is 55 and 62 respectively.  GENERAL:  He is well-developed, well-nourished, and in no acute distress.  SKIN:  Warm and dry.  MENTAL STATUS:  He does not appear to be depressed and there is no  peripheral clubbing.  HEENT:  Unremarkable, normal eyelids.  NECK:   Supple with normal upstroke bilaterally.  There is soft bilateral  bruits.  There is no jugular venous distention.  CHEST:  Clear to auscultation with normal expansion.  CARDIOVASCULAR:  Regular rate and rhythm with normal S1 and S2.  There are  no murmurs, rubs, or gallops noted.  ABDOMEN:  Nontender, non-distended, positive bowel sounds noted.  No  hepatosplenomegaly or mass appreciated.  No abdominal bruits.  VASCULAR:  He has 2+ femoral pulses bilaterally and there is a right femoral  bruit.  EXTREMITIES:  No edema and I can palpate no cords.  His distal pulses are  diminished.  NEUROLOGIC:  Grossly intact.   LABORATORIES:  BUN and creatinine 41 and 2.3.  His initial enzymes are  negative.  His potassium is elevated at 5.9.  His hemoglobin is 10.5 with a  hematocrit of 30.7.  His platelet count is 189.  His electrocardiogram shows  a marked sinus bradycardia with a left bundle branch block.   DIAGNOSES:  1.  Dizziness.  2.  Coronary artery disease status post coronary artery bypass graft.  3.  Ischemic cardiomyopathy.  4.  Status post implantable cardioverter defibrillator.  5.  Status post recent initiation of amiodarone secondary to ventricular      tachycardia storm.  6.  Diabetes mellitus.  7.  Hypertension.  8.  Hyperlipidemia.  9.  Chronic obstructive pulmonary disease.  10. Renal insufficiency.  11. Hyperkalemia.   PLAN:  [name] has been admitted for dizziness. The etiology of this  is not clear to me.  He is not orthostatic on exam.  There are also no  arrhythmias demonstrated on interrogation of his ICD (there is one run of  nonsustained ventricular tachycardia but his symptoms have been much more  prevalent).  We will follow his enzymes.  If they are  negative, then we will plan an outpatient Myoview for risk stratification.  He is noted to have hyperkalemia and we will hold his Avapro and enalapril for now.  We will give him Kayexalate.  We will plan to  recheck this  tomorrow morning.           ______________________________  Olga Millers, M.D. [facility]     BC/MEDQ  D:  08/19/2005  T:  08/21/2005  Job:  [id number]

## 2010-10-11 NOTE — Cardiovascular Report (Signed)
[place]  Patient:    [name], [name] Visit Number: [id number] MRN: [id number]          Service Type: CAT Location: [facility] 2899 10 Attending Physician:  Rollene Rotunda Dictated by:   Rollene Rotunda, M.D. [facility] Proc. Date: 07/05/01 Admit Date:  07/05/2001   CC:         Lonzo Cloud. Kriste Basque, M.D. [facility]  Velora Heckler, M.D.   Cardiac Catheterization  DATE OF BIRTH:  [birth year]  PRIMARY CARE PHYSICIAN:  Dr. Lonzo Cloud. Nadel.  REASON FOR PRESENTATION:  Evaluate patient with an abnormal Cardiolite demonstrating a previous infarct with peri-infarct ischemia and an EF of approximately 35%.  He was also known to have an occluded right coronary artery in the past.  He has had ongoing risk factors and was being considered for colectomy.  PROCEDURAL NOTE:  The left heart catheterization was performed via the right femoral artery.  The artery was cannulated using anterior wall puncture.  A #6-French arterial sheath was inserted via the modified Seldinger technique. Preformed Judkins and a pigtail catheter were utilized.  The patient tolerated the procedure well and left the lab in stable condition.  RESULTS: Hemodynamics:  LV 161/37, Ao 161/90.  Coronaries:  The left main had distal 70% stenosis.  The LAD had proximal 25% stenosis.  The circumflex had mid 70% stenosis at the origin of the mid-obtuse marginal.  The right coronary artery was occluded with proximal bridging collaterals and moderate diffuse disease.  Left ventriculogram:  The left ventriculogram was obtained in the RAO projection.  It was 35% with severe inferior akinesis.  Aortogram:  A distal aortogram was obtained secondary to difficulty advancing the wire.  There was moderate diffuse plaquing in the distal aorta and after the bifurcation.  CONCLUSION:  Severe three-vessel coronary artery disease with left main stenosis.  Moderately reduced ejection fraction.  PLAN:  The  patient will be referred for CABG. Dictated by:   Rollene Rotunda, M.D. [facility] Attending Physician:  Rollene Rotunda DD:  07/05/01 TD:  07/05/01 Job: [id number]

## 2010-10-11 NOTE — Op Note (Signed)
NAME:  BURKE, TERRY            ACCOUNT NO.:  [id number]   MEDICAL RECORD NO.:  [id number]          PATIENT TYPE:  INP   LOCATION:  2024                         FACILITY:  [facility]   PHYSICIAN:  [name], M.D.  DATE OF BIRTH:  1936/01/14   DATE OF PROCEDURE:  12/27/2004  DATE OF DISCHARGE:                                 OPERATIVE REPORT   PREOPERATIVE DIAGNOSIS:  Ventricular tachycardia.   POSTOPERATIVE DIAGNOSIS:  Ventricular tachycardia.   OPERATION PERFORMED:  Insertion of a single chamber defibrillator with  intraoperative defibrillation threshold testing.   INDICATIONS FOR PROCEDURE:  Mr. Corron is 75 year old gentleman with  history of coronary artery disease, prior bypass surgery, ejection fraction  of about 35%, who presented to the hospital with shortness of breath and was  found to be in monomorphic ventricular tachycardia.  Cycle length was 280  msec.  Morphology was right bundle, normal left axis.   DESCRIPTION OF PROCEDURE:  Following the obtaining of informed consent, the  patient was brought to the electrophysiology laboratory and placed on the  fluoroscopic table in supine position.  After routine prep and drape of the  left upper chest, lidocaine was infiltrated in the prepectoral subclavicular  region.  Incision was made and carried down to the layer of the prepectoral  fascia using electrocautery and sharp dissection.  A pocket was formed  similarly.  Hemostasis was obtained.   Thereafter attention was turned to gaining access to the extrathoracic left  subclavian vein which was accomplished without difficulty and without the  aspiration of air or puncture of the artery.  A guidewire was placed and  retained.  A 7 French sheath was placed through which was then passed a  Medtronic 6949 65 cm active fixation dual coil defibrillator lead, serial  number [id number].  Under fluoroscopic guidance, it was manipulated to the  right ventricular apex where  the bipolar R wave was 11.5 mV with a pacing  impedance of 219 ohms and a threshold of 0.4 V at 0.5 msec.  Current at  threshold was 0.5 mA and there was no diaphragmatic pacing at 10 V.   With these acceptable parameters recorded, the lead was secured to a  Medtronic Maxima [id number] ICD serial number [id number].  Through the device,  the bipolar R wave was 7.7 mV with a pacing impedance of 760 ohms and  threshold of 1 V at 0.1 msec.  Proximal coil impedance was 55 ohms.  Distal  coil impedance was 45 ohms.   With these acceptable parameters recorded, defibrillation threshold testing  was undertaken.  Ventricular fibrillation was induced via the T-wave shock.  After a total duration of actually 30 seconds, antitachycardia pacing was  delivered into monomorphic ventricular tachycardia that had a cycle length  of about 400 msec.  Initially, 84% was attempted without success.  81% was  attempted without success and then 78% of the R-R interval was successful in  terminating the monomorphic ventricular tachycardia.  After waiting for a  couple of minutes, ventricular fibrillation was then induced via the T-wave  shock.  After a total duration of 6  seconds, a 15 joule shock was delivered  through a measured resistance of 44 ohms terminating ventricular  fibrillation and restoring sinus rhythm.   After waiting six or seven minutes, ventricular fibrillation was induced  again via the T-wave shock.  After a total duration of six seconds, a 15  joule shock was delivered through a resistance at this time of 43 ohms  terminating end of ventricular fibrillation and restoring sinus rhythm.  At  this point the device was implanted.  The pocket was copiously irrigated  with antibiotic saline solution.  Hemostasis was assured and the leads and  pulse generator were placed in the pocket and secured to the prepectoral  fascia.  The wound was closed in three layers in normal fashion.  The wound  was  washed and dried and a benzoin and Steri-Strip dressing was applied.  Sponge, needle and instrument counts were correct at the end of the  procedure according to the staff.  The patient tolerated the procedure  without apparent complications.       SCK/MEDQ  D:  12/27/2004  T:  12/28/2004  Job:  [id number]   cc:   Electrophys lab   [facility]

## 2010-10-11 NOTE — Assessment & Plan Note (Signed)
[facility]                   COUMADIN / CHRONIC HEART FAILURE CLINIC NOTE   NAME:[name], [name]                   MRN:          [id number]  DATE:12/29/2005                            DOB:          04/30/1936    [name] returns today for further evaluation and medication titration  of his congestive heart failure secondary to ischemic cardiomyopathy.  Mr.  [name] states he has been doing well.  I saw him last in June of this  year, and had him follow up with Dr. Graciela Husbands for a defibrillator check.  He  saw Dr. Graciela Husbands in July and increased his enalapril to 10 mg b.i.d.  Noted  patient's mild hyperkalemia, recommended followup blood work in regards to  his amiodarone therapy.  [name] states he has been tolerating his  medications without any problems, denying any shortness of breath or dyspnea  on exertion, no episodes of chest discomfort or episodes of ICD firing.  He  complains of mild puffiness today.  On further review, [name] is  not taking any type of diuretic.  He apparently stopped taking it back in  April or May because he did not wish to take it any more.  He says he  discussed it with the doctor he saw.  I cannot find any documentation in the  chart that his Lasix was ever discontinued.   PAST MEDICAL HISTORY:  1. Congestive heart failure secondary to ischemic cardiomyopathy.  Last      ejection fraction calculated at 45% by cardiac catheterization in July      of 2006.  2. Coronary artery disease, status post 3-vessel coronary artery bypass      graft in 2003.  Last cardiac catheterization July of 2006, showing an      EF of 45%.  The saphenous vein graft to the posterior descending artery      did not fill, and the left internal mammary artery to the LAD was      atretic.  3. History of cardiac arrhythmias with automated implantable cardioverter-      defibrillator, and recent ventricular tachycardic storm with  multiple      discharge.  The patient placed on amiodarone therapy.  4. Recurrent ventricular tachycardia, treated with antitachycardia pacing.  5. Renal insufficiency with a creatinine of 1.8.  6. Hyperkalemia with a mildly elevated potassium of 5.4.  7. History of hypertension.  8. History of diabetes.  9. History of COPD.  10.History of colon polyps.  11.History of left carotid bruit, no significant internal coronary artery      stenosis by ultrasonography in 2003.  12.Peripheral vascular disease - infrainguinal arterial occlusive disease,      status post PTCA of the right superficial femoral artery.   REVIEW OF SYSTEMS:  As stated above in history of present illness, otherwise  negative.   CURRENT MEDICATIONS:  1. Prednisone 5 mg daily.  2. Advair 500/50.  3. Amiodarone 200 mg daily.  4. Toprol XL 100 mg daily.  5. Lipitor 20 mg daily.  6. Etodolac 400 mg daily.  7. Glyburide.  8. Metformin b.i.d.  9. Lopid 200  mg daily.  10.Enalapril 10 mg b.i.d.  11.Xopenex p.r.n.   PHYSICAL EXAMINATION:  Weight 182 pounds.  Weight is up 5 pounds since last  heart failure visit a month ago.  Blood pressure 97/61 with a pulse of 61.  In no acute distress.  No JVD at a 45 degree angle.  LUNGS:  Clear to auscultation bilaterally with fine rhonchi in the right  upper lobe that clears with cough.  CARDIOVASCULAR:  Heart sounds regular rate and rhythm.  ABDOMEN:  Soft, nontender, slightly distended.  LOWER EXTREMITIES:  Without clubbing or cyanosis.  The patient has trace of  pitting edema to bilateral lower extremities.   IMPRESSION:  Stable class II heart failure secondary to ischemic  cardiomyopathy, status post implantable cardioverter-defibrillator with  known history of V tach.  Current ejection fraction of 45%.  Will continue  current medications.  The patient appears to be in very mild volume overload  today.  I have asked him to let me write a prescription for furosemide 20 mg   for him to use p.r.n.  The patient is agreeable to this.  I have asked him  to take a dose today.  I am also going to check lab work today in regards to  mild hyperkalemia, recheck renal function, also will check a TSH and liver  function in regards to patient's amiodarone therapy.                                 Dorian Pod, ACNP    MB/MedQ  DD:  01/29/2006  DT:  01/30/2006  Job #:  [id number]

## 2010-10-30 ENCOUNTER — Telehealth: Payer: Self-pay | Admitting: Cardiology

## 2010-10-30 MED ORDER — MEXILETINE HCL 150 MG PO CAPS: 150.0000 mg | ORAL_CAPSULE | Freq: Two times a day (BID) | ORAL | Status: DC

## 2010-10-30 NOTE — Telephone Encounter (Signed)
mixcilitine refill [facility]

## 2010-10-30 NOTE — Telephone Encounter (Signed)
Mexiletine Hcl 150 Mg  Caps (Mexiletine Hcl) .... Take One Capsule Two Times A Day

## 2011-01-23 ENCOUNTER — Other Ambulatory Visit: Payer: Self-pay | Admitting: Cardiology

## 2011-01-24 ENCOUNTER — Encounter: Payer: Self-pay | Admitting: *Deleted

## 2011-02-14 LAB — BASIC METABOLIC PANEL
BUN: 21
CO2: 29
Chloride: 101
Creatinine, Ser: 1.85 — ABNORMAL HIGH
Potassium: 4.4

## 2011-03-05 LAB — BASIC METABOLIC PANEL
BUN: 20
CO2: 25
Calcium: 8.9
Calcium: 9
Calcium: 9.5
Chloride: 100
Chloride: 103
Chloride: 98
Creatinine, Ser: 1.47
Creatinine, Ser: 1.5
GFR calc Af Amer: 56 — ABNORMAL LOW
GFR calc Af Amer: 57 — ABNORMAL LOW
GFR calc non Af Amer: 47 — ABNORMAL LOW
GFR calc non Af Amer: 48 — ABNORMAL LOW
Glucose, Bld: 65 — ABNORMAL LOW
Glucose, Bld: 83
Potassium: 5.2 — ABNORMAL HIGH
Potassium: 5.5 — ABNORMAL HIGH
Sodium: 135
Sodium: 136
Sodium: 138

## 2011-03-05 LAB — APTT: aPTT: 30

## 2011-03-05 LAB — COMPREHENSIVE METABOLIC PANEL
Alkaline Phosphatase: 70
BUN: 21
CO2: 30
Chloride: 101
Creatinine, Ser: 1.39
GFR calc non Af Amer: 51 — ABNORMAL LOW
Glucose, Bld: 193 — ABNORMAL HIGH
Potassium: 4.7
Total Bilirubin: 0.9

## 2011-03-05 LAB — DIFFERENTIAL
Basophils Absolute: 0.1
Basophils Relative: 1
Eosinophils Absolute: 0.2
Eosinophils Relative: 2
Monocytes Absolute: 1 — ABNORMAL HIGH
Monocytes Relative: 9
Neutro Abs: 6.8

## 2011-03-05 LAB — CBC
Hemoglobin: 14.6
MCHC: 33.8
MCV: 88.1
MCV: 89.6
Platelets: 188
RBC: 4.44
RDW: 14
WBC: 7.3

## 2011-03-05 LAB — HEMOGLOBIN A1C
Hgb A1c MFr Bld: 7.1 — ABNORMAL HIGH
Mean Plasma Glucose: 175

## 2011-03-05 LAB — MAGNESIUM: Magnesium: 2.9 — ABNORMAL HIGH

## 2011-03-12 ENCOUNTER — Encounter: Payer: Self-pay | Admitting: Internal Medicine

## 2011-03-12 ENCOUNTER — Ambulatory Visit (INDEPENDENT_AMBULATORY_CARE_PROVIDER_SITE_OTHER): Payer: Medicare Other | Admitting: *Deleted

## 2011-03-12 DIAGNOSIS — I472 Ventricular tachycardia: Secondary | ICD-10-CM

## 2011-03-26 ENCOUNTER — Other Ambulatory Visit: Payer: Self-pay | Admitting: Cardiology

## 2011-04-10 ENCOUNTER — Other Ambulatory Visit: Payer: Self-pay | Admitting: Pulmonary Disease

## 2011-04-15 ENCOUNTER — Other Ambulatory Visit: Payer: Self-pay | Admitting: Pulmonary Disease

## 2011-05-29 ENCOUNTER — Other Ambulatory Visit: Payer: Self-pay | Admitting: Internal Medicine

## 2011-06-19 ENCOUNTER — Other Ambulatory Visit: Payer: Self-pay | Admitting: Pulmonary Disease

## 2011-06-25 ENCOUNTER — Encounter: Payer: Self-pay | Admitting: *Deleted

## 2011-06-25 ENCOUNTER — Ambulatory Visit (INDEPENDENT_AMBULATORY_CARE_PROVIDER_SITE_OTHER): Payer: Medicare Other | Admitting: Cardiology

## 2011-06-25 DIAGNOSIS — E7800 Pure hypercholesterolemia, unspecified: Secondary | ICD-10-CM

## 2011-06-25 DIAGNOSIS — I5022 Chronic systolic (congestive) heart failure: Secondary | ICD-10-CM

## 2011-06-25 DIAGNOSIS — I2510 Atherosclerotic heart disease of native coronary artery without angina pectoris: Secondary | ICD-10-CM

## 2011-06-25 DIAGNOSIS — I6529 Occlusion and stenosis of unspecified carotid artery: Secondary | ICD-10-CM

## 2011-06-25 DIAGNOSIS — I472 Ventricular tachycardia: Secondary | ICD-10-CM

## 2011-06-25 DIAGNOSIS — I10 Essential (primary) hypertension: Secondary | ICD-10-CM

## 2011-06-25 DIAGNOSIS — R5383 Other fatigue: Secondary | ICD-10-CM

## 2011-06-25 DIAGNOSIS — I509 Heart failure, unspecified: Secondary | ICD-10-CM

## 2011-06-25 MED ORDER — ENALAPRIL MALEATE 5 MG PO TABS: 5.0000 mg | ORAL_TABLET | Freq: Two times a day (BID) | ORAL | Status: DC

## 2011-06-25 MED ORDER — FUROSEMIDE 20 MG PO TABS: 20.0000 mg | ORAL_TABLET | ORAL | Status: DC | PRN

## 2011-06-25 MED ORDER — PRAVASTATIN SODIUM 80 MG PO TABS: 80.0000 mg | ORAL_TABLET | Freq: Every day | ORAL | Status: DC

## 2011-06-25 MED ORDER — CARVEDILOL 12.5 MG PO TABS: 12.5000 mg | ORAL_TABLET | Freq: Two times a day (BID) | ORAL | Status: DC

## 2011-06-25 MED ORDER — FENOFIBRATE 160 MG PO TABS: 160.0000 mg | ORAL_TABLET | Freq: Every day | ORAL | Status: DC

## 2011-06-25 NOTE — Assessment & Plan Note (Signed)
I reviewed this and he is not due to have follow up until 11/13 and this will be arranged.

## 2011-06-25 NOTE — Assessment & Plan Note (Signed)
The blood pressure is at target. No change in medications is indicated. We will continue with therapeutic lifestyle changes (TLC).  

## 2011-06-25 NOTE — Assessment & Plan Note (Signed)
I will check and echocardiogram to evaluate his EF it has been several years since the last one.

## 2011-06-25 NOTE — Patient Instructions (Addendum)
Your physician has requested that you have an echocardiogram. Echocardiography is a painless test that uses sound waves to create images of your heart. It provides your doctor with information about the size and shape of your heart and how well your heart's chambers and valves are working. This procedure takes approximately one hour. There are no restrictions for this procedure.  Please return for fasting lab the same day as your echo.  The current medical regimen is effective;  continue present plan and medications.  Follow up in 1 year with Dr Antoine Poche.  You will receive a letter in the mail 2 months before you are due.  Please call us when you receive this letter to schedule your follow up appointment.

## 2011-06-25 NOTE — Assessment & Plan Note (Signed)
The patient has no new sypmtoms.  No further cardiovascular testing is indicated.  We will continue with aggressive risk reduction and meds as listed.  

## 2011-06-25 NOTE — Progress Notes (Signed)
HPI The patient presents for followup of his known coronary disease and cardiomyopathy.  Since the last time I saw him he reports no new cardiovascular complaints. He's not exercising routinely. He says his back and neck bother him so he is not golfing like he used to. However, with his usual activity he denies any chest pressure, neck or arm discomfort. He denies any palpitations, presyncope or syncope. He has had no weight gain or edema.  He has had no firings of his ICD.  Allergies  Allergen Reactions  . Niacin     REACTION: INTOL to Niaspan per pt    Current Outpatient Prescriptions  Medication Sig Dispense Refill  . aspirin 325 MG EC tablet Take 325 mg by mouth daily.        . carvedilol (COREG) 12.5 MG tablet TAKE ONE TABLET BY MOUTH TWICE DAILY  60 tablet  6  . enalapril (VASOTEC) 5 MG tablet TAKE ONE TABLET BY MOUTH TWICE DAILY  60 tablet  0  . fenofibrate 160 MG tablet TAKE ONE TABLET BY MOUTH EVERY DAY  30 tablet  0  . Fluticasone-Salmeterol (ADVAIR DISKUS) 500-50 MCG/DOSE AEPB Inhale 1 puff into the lungs 2 (two) times daily.  60 each  4  . furosemide (LASIX) 20 MG tablet Take 20 mg by mouth as needed.        . glyBURIDE-metformin (GLUCOVANCE) 2.5-500 MG per tablet TAKE ONE-HALF TABLET BY MOUTH TWICE DAILY WITH BREAKFAST AND DINNER  90 tablet  0  . guaiFENesin (MUCINEX) 600 MG 12 hr tablet Take 1,200 mg by mouth 2 (two) times daily.        Marland Kitchen levalbuterol (XOPENEX HFA) 45 MCG/ACT inhaler Inhale 1-2 puffs into the lungs every 4 (four) hours as needed.        . mexiletine (MEXITIL) 150 MG capsule Take 1 capsule (150 mg total) by mouth 2 (two) times daily.  60 capsule  12  . pravastatin (PRAVACHOL) 80 MG tablet TAKE ONE TABLET BY MOUTH EVERY DAY  30 tablet  0    Past Medical History  Diagnosis Date  . VENTRICULAR TACHYCARDIA   . PERIPHERAL VASCULAR DISEASE   . HYPERTENSION   . HYPERCHOLESTEROLEMIA   . CORONARY ARTERY DISEASE   . Chronic systolic heart failure   . CAROTID  ARTERY DISEASE   . SEBORRHEIC DERMATITIS   . RENAL INSUFFICIENCY   . DIVERTICULOSIS OF COLON   . DIABETES MELLITUS   . DEGENERATIVE JOINT DISEASE   . CPK, ABNORMAL   . COPD   . COLONIC POLYPS   . CANDIDIASIS, ORAL   . AUTOMATIC IMPLANTABLE CARDIAC DEFIBRILLATOR SITU   . ASTHMATIC BRONCHITIS, ACUTE   . ANXIETY     Past Surgical History  Procedure Date  . Coronary artery bypass graft   . Nsertion of a single chamber defibrillator- medtronic maxima 8/06 drklein     ROS:  Back and neck pain.  Otherwise stated in the HPI and negative for all other systems.  PHYSICAL EXAM BP 119/60  Pulse 57  Ht 5\' 7"  (1.702 m)  Wt 175 lb (79.379 kg)  BMI 27.41 kg/m2 GENERAL:  Well appearing HEENT:  Pupils equal round and reactive, fundi not visualized, oral mucosa unremarkable NECK:  No jugular venous distention, waveform within normal limits, carotid upstroke brisk and symmetric, no bruits, no thyromegaly LYMPHATICS:  No cervical, inguinal adenopathy LUNGS:  Clear to auscultation bilaterally BACK:  No CVA tenderness CHEST:  ICD intact, well healed sternotomy scar. HEART:  PMI not displaced or sustained,S1 and S2 within normal limits, no S3, no S4, no clicks, no rubs, no murmurs ABD:  Flat, positive bowel sounds normal in frequency in pitch, no bruits, no rebound, no guarding, no midline pulsatile mass, no hepatomegaly, no splenomegaly EXT:  2 plus pulses throughout, no edema, diminished lower extremity pulse,  no cyanosis no clubbing SKIN:  No rashes no nodules NEURO:  Cranial nerves II through XII grossly intact, motor grossly intact throughout [facility]:  Cognitively intact, oriented to person place and time  EKG: Sinus rhythm, interventricular conduction delay, rate 57, right axis deviation, no acute ST-T wave change 06/25/2011  ASSESSMENT AND PLAN

## 2011-06-25 NOTE — Assessment & Plan Note (Signed)
I will check a lipid profile with a goal LDL less than 100 and HDL greater than 40.

## 2011-06-26 ENCOUNTER — Encounter: Payer: Self-pay | Admitting: Internal Medicine

## 2011-06-26 ENCOUNTER — Ambulatory Visit (INDEPENDENT_AMBULATORY_CARE_PROVIDER_SITE_OTHER): Payer: Medicare Other | Admitting: Internal Medicine

## 2011-06-26 DIAGNOSIS — Z95810 Presence of automatic (implantable) cardiac defibrillator: Secondary | ICD-10-CM

## 2011-06-26 DIAGNOSIS — I472 Ventricular tachycardia: Secondary | ICD-10-CM

## 2011-06-26 LAB — ICD DEVICE OBSERVATION
BRDY-0002RV: 40 {beats}/min
CHARGE TIME: 8.87 s
DEV-0020ICD: NEGATIVE
RV LEAD AMPLITUDE: 6.7 mv
RV LEAD IMPEDENCE ICD: 672 Ohm
RV LEAD THRESHOLD: 1 V
TZAT-0001FASTVT: 1
TZAT-0001SLOWVT: 1
TZAT-0001SLOWVT: 2
TZAT-0004SLOWVT: 6
TZAT-0004SLOWVT: 8
TZAT-0005FASTVT: 88 pct
TZAT-0011FASTVT: 10 ms
TZAT-0012SLOWVT: 200 ms
TZAT-0012SLOWVT: 200 ms
TZAT-0013FASTVT: 2
TZAT-0013SLOWVT: 4
TZAT-0013SLOWVT: 4
TZAT-0018FASTVT: NEGATIVE
TZAT-0018SLOWVT: NEGATIVE
TZAT-0018SLOWVT: NEGATIVE
TZAT-0019FASTVT: 8 V
TZAT-0020SLOWVT: 1.6 ms
TZAT-0020SLOWVT: 1.6 ms
TZON-0003SLOWVT: 430 ms
TZON-0004SLOWVT: 32
TZON-0005SLOWVT: 24
TZON-0008FASTVT: 0 ms
TZON-0008SLOWVT: 0 ms
TZON-0011AFLUTTER: 70
TZST-0001FASTVT: 3
TZST-0001FASTVT: 5
TZST-0001FASTVT: 6
TZST-0001SLOWVT: 5
TZST-0001SLOWVT: 6
TZST-0003FASTVT: 35 J
TZST-0003FASTVT: 35 J
TZST-0003FASTVT: 35 J
TZST-0003SLOWVT: 35 J
TZST-0003SLOWVT: 35 J
VENTRICULAR PACING ICD: 0 pct

## 2011-06-26 NOTE — Progress Notes (Signed)
Primary Cardiologist:  Dr Antoine Poche Primary Electrophysiologist:  Dr Graciela Husbands  The patient presents for routine EP followup.  He is typically seen by Dr Graciela Husbands and was somehow scheduled to see me by mistake. He states that he is doing very well. He remains active without chest pain, SOB, orthopnea, PND, dizziness, presyncope, syncope, or ICD shocks.   Allergies  Allergen Reactions  . Niacin     REACTION: INTOL to Niaspan per pt    Current Outpatient Prescriptions  Medication Sig Dispense Refill  . aspirin 325 MG EC tablet Take 325 mg by mouth daily.        . carvedilol (COREG) 12.5 MG tablet Take 1 tablet (12.5 mg total) by mouth 2 (two) times daily with a meal.  180 tablet  3  . enalapril (VASOTEC) 5 MG tablet Take 1 tablet (5 mg total) by mouth 2 (two) times daily.  180 tablet  3  . fenofibrate 160 MG tablet Take 1 tablet (160 mg total) by mouth daily.  90 tablet  3  . Fluticasone-Salmeterol (ADVAIR DISKUS) 500-50 MCG/DOSE AEPB Inhale 1 puff into the lungs 2 (two) times daily.  60 each  4  . furosemide (LASIX) 20 MG tablet Take 1 tablet (20 mg total) by mouth as needed.  90 tablet  1  . glyBURIDE-metformin (GLUCOVANCE) 2.5-500 MG per tablet TAKE ONE-HALF TABLET BY MOUTH TWICE DAILY WITH BREAKFAST AND DINNER  90 tablet  0  . guaiFENesin (MUCINEX) 600 MG 12 hr tablet Take 1,200 mg by mouth 2 (two) times daily.        Marland Kitchen levalbuterol (XOPENEX HFA) 45 MCG/ACT inhaler Inhale 1-2 puffs into the lungs every 4 (four) hours as needed.        . mexiletine (MEXITIL) 150 MG capsule Take 1 capsule (150 mg total) by mouth 2 (two) times daily.  60 capsule  12  . pravastatin (PRAVACHOL) 80 MG tablet Take 1 tablet (80 mg total) by mouth daily.  90 tablet  3    Past Medical History  Diagnosis Date  . VENTRICULAR TACHYCARDIA   . PERIPHERAL VASCULAR DISEASE   . HYPERTENSION   . HYPERCHOLESTEROLEMIA   . CORONARY ARTERY DISEASE   . Chronic systolic heart failure   . CAROTID ARTERY DISEASE   . SEBORRHEIC  DERMATITIS   . RENAL INSUFFICIENCY   . DIVERTICULOSIS OF COLON   . DIABETES MELLITUS   . DEGENERATIVE JOINT DISEASE   . CPK, ABNORMAL   . COPD   . COLONIC POLYPS   . CANDIDIASIS, ORAL   . AUTOMATIC IMPLANTABLE CARDIAC DEFIBRILLATOR SITU   . ASTHMATIC BRONCHITIS, ACUTE   . ANXIETY     Past Surgical History  Procedure Date  . Coronary artery bypass graft   . Nsertion of a single chamber defibrillator- medtronic maxima 8/06 drklein     PHYSICAL EXAM BP 119/63  Pulse 67  Ht 5\' 7"  (1.702 m)  Wt 177 lb (80.287 kg)  BMI 27.72 kg/m2 GENERAL:  Well appearing HEENT:  Pupils equal round and reactive, fundi not visualized, oral mucosa unremarkable NECK:  No jugular venous distention, waveform within normal limits, carotid upstroke brisk and symmetric, no bruits, no thyromegaly LUNGS:  Clear to auscultation bilaterally, few expiratory wheezes BACK:  No CVA tenderness CHEST:  ICD pocket is well healed, well healed sternotomy scar. HEART:  PMI not displaced or sustained,S1 and S2 within normal limits, no S3, no S4, no clicks, no rubs, no murmurs ABD:  Flat, positive bowel sounds normal in  frequency in pitch, no bruits, no rebound, no guarding, no midline pulsatile mass, no hepatomegaly, no splenomegaly EXT:  2 plus pulses throughout, no edema, diminished lower extremity pulse,  no cyanosis no clubbing SKIN:  No rashes no nodules NEURO:  Cranial nerves II through XII grossly intact, motor grossly intact throughout PSYCH:  Cognitively intact, oriented to person place and time  ASSESSMENT AND PLAN

## 2011-06-26 NOTE — Patient Instructions (Signed)
Your physician wants you to follow-up in: 12 months with Dr Logan Bores will receive a reminder letter in the mail two months in advance. If you don't receive a letter, please call our office to schedule the follow-up appointment.   Remote monitoring is used to monitor your Pacemaker of ICD from home. This monitoring reduces the number of office visits required to check your device to one time per year. It allows [place] to keep an eye on the functioning of your device to ensure it is working properly. You are scheduled for a device check from home on 09/25/11. You may send your transmission at any time that day. If you have a wireless device, the transmission will be sent automatically. After your physician reviews your transmission, you will receive a postcard with your next transmission date.

## 2011-06-26 NOTE — Assessment & Plan Note (Signed)
Normal ICD function See Arita Miss Art report His arrhythmia appears to be well controlled with mexiletine  He has a MDT Fidelis [phone] lead which appears to be functioning normally. I have extended NID in the VF zone to 30/40 today and also extended NID in the VT zone to 40 to minimize the risks of inappropriate ICD shocks. He will enroll in [facility] at this time and return to see Dr Graciela Husbands in 12 months.

## 2011-07-02 ENCOUNTER — Other Ambulatory Visit (INDEPENDENT_AMBULATORY_CARE_PROVIDER_SITE_OTHER): Payer: Medicare Other | Admitting: *Deleted

## 2011-07-02 ENCOUNTER — Ambulatory Visit (HOSPITAL_COMMUNITY): Payer: Medicare Other | Attending: Cardiovascular Disease

## 2011-07-02 DIAGNOSIS — E7800 Pure hypercholesterolemia, unspecified: Secondary | ICD-10-CM

## 2011-07-02 DIAGNOSIS — I2510 Atherosclerotic heart disease of native coronary artery without angina pectoris: Secondary | ICD-10-CM

## 2011-07-02 DIAGNOSIS — I509 Heart failure, unspecified: Secondary | ICD-10-CM

## 2011-07-02 DIAGNOSIS — E119 Type 2 diabetes mellitus without complications: Secondary | ICD-10-CM | POA: Insufficient documentation

## 2011-07-02 DIAGNOSIS — R5383 Other fatigue: Secondary | ICD-10-CM

## 2011-07-02 DIAGNOSIS — R5381 Other malaise: Secondary | ICD-10-CM

## 2011-07-02 DIAGNOSIS — E785 Hyperlipidemia, unspecified: Secondary | ICD-10-CM | POA: Insufficient documentation

## 2011-07-02 LAB — BASIC METABOLIC PANEL
CO2: 31 mEq/L (ref 19–32)
Calcium: 9.5 mg/dL (ref 8.4–10.5)
Sodium: 141 mEq/L (ref 135–145)

## 2011-07-02 LAB — CBC WITH DIFFERENTIAL/PLATELET
Eosinophils Relative: 2.1 % (ref 0.0–5.0)
Lymphocytes Relative: 32.9 % (ref 12.0–46.0)
MCV: 89.2 fl (ref 78.0–100.0)
Monocytes Absolute: 0.8 10*3/uL (ref 0.1–1.0)
Monocytes Relative: 10.9 % (ref 3.0–12.0)
Neutrophils Relative %: 53.6 % (ref 43.0–77.0)
Platelets: 179 10*3/uL (ref 150.0–400.0)
WBC: 7.4 10*3/uL (ref 4.5–10.5)

## 2011-07-02 LAB — LIPID PANEL
HDL: 30.9 mg/dL — ABNORMAL LOW (ref >39.00)
Triglycerides: 105 mg/dL (ref 0.0–149.0)

## 2011-07-02 LAB — TSH: TSH: 2.79 u[IU]/mL (ref 0.35–5.50)

## 2011-07-02 LAB — HEPATIC FUNCTION PANEL
ALT: 16 U/L (ref 0–53)
AST: 27 U/L (ref 0–37)
Albumin: 4 g/dL (ref 3.5–5.2)
Total Protein: 6.8 g/dL (ref 6.0–8.3)

## 2011-07-04 ENCOUNTER — Telehealth: Payer: Self-pay | Admitting: *Deleted

## 2011-07-04 DIAGNOSIS — E875 Hyperkalemia: Secondary | ICD-10-CM

## 2011-07-04 NOTE — Telephone Encounter (Signed)
Message copied by Burnell Blanks on Fri Jul 04, 2011  5:04 PM ------      Message from: Rollene Rotunda      Created: Fri Jul 04, 2011  9:47 AM       Needs a repeat BMET on Monday.

## 2011-07-04 NOTE — Telephone Encounter (Signed)
Advised to avoid high potassium foods and recheck on Monday. Verbalized understanding

## 2011-07-07 ENCOUNTER — Other Ambulatory Visit (INDEPENDENT_AMBULATORY_CARE_PROVIDER_SITE_OTHER): Payer: Medicare Other | Admitting: *Deleted

## 2011-07-07 DIAGNOSIS — E875 Hyperkalemia: Secondary | ICD-10-CM

## 2011-07-07 LAB — BASIC METABOLIC PANEL
BUN: 37 mg/dL — ABNORMAL HIGH (ref 6–23)
CO2: 31 mEq/L (ref 19–32)
Chloride: 99 mEq/L (ref 96–112)
Creatinine, Ser: 1.7 mg/dL — ABNORMAL HIGH (ref 0.4–1.5)

## 2011-07-11 ENCOUNTER — Other Ambulatory Visit: Payer: Self-pay | Admitting: Pulmonary Disease

## 2011-09-13 ENCOUNTER — Other Ambulatory Visit: Payer: Self-pay | Admitting: Pulmonary Disease

## 2011-09-19 ENCOUNTER — Telehealth: Payer: Self-pay | Admitting: Pulmonary Disease

## 2011-09-19 MED ORDER — FLUTICASONE-SALMETEROL 500-50 MCG/DOSE IN AEPB: INHALATION_SPRAY | RESPIRATORY_TRACT | Status: DC

## 2011-09-19 NOTE — Telephone Encounter (Signed)
[name] last OV with Dr. Kriste Basque was 03/01/2010.  He has scheduled a f/u on November 04, 2011.    Called, spoke with pt.  He is aware advair rx will be sent to pharmacy but he will need to keep appt in June for additional refills.  He verbalized understanding of this.

## 2011-09-25 ENCOUNTER — Encounter: Payer: Medicare Other | Admitting: *Deleted

## 2011-09-30 ENCOUNTER — Encounter: Payer: Self-pay | Admitting: *Deleted

## 2011-10-08 ENCOUNTER — Encounter: Payer: Self-pay | Admitting: Internal Medicine

## 2011-10-08 ENCOUNTER — Ambulatory Visit (INDEPENDENT_AMBULATORY_CARE_PROVIDER_SITE_OTHER): Payer: Medicare Other | Admitting: *Deleted

## 2011-10-08 DIAGNOSIS — I5022 Chronic systolic (congestive) heart failure: Secondary | ICD-10-CM

## 2011-10-08 DIAGNOSIS — Z95810 Presence of automatic (implantable) cardiac defibrillator: Secondary | ICD-10-CM

## 2011-10-13 ENCOUNTER — Other Ambulatory Visit: Payer: Self-pay | Admitting: Pulmonary Disease

## 2011-10-13 LAB — REMOTE ICD DEVICE
RV LEAD IMPEDENCE ICD: 608 Ohm
TZAT-0001SLOWVT: 2
TZAT-0004FASTVT: 8
TZAT-0004SLOWVT: 6
TZAT-0004SLOWVT: 8
TZAT-0005SLOWVT: 78 pct
TZAT-0005SLOWVT: 88 pct
TZAT-0011FASTVT: 10 ms
TZAT-0011SLOWVT: 10 ms
TZAT-0011SLOWVT: 10 ms
TZAT-0012FASTVT: 200 ms
TZAT-0020FASTVT: 1.6 ms
TZON-0003FASTVT: 240 ms
TZON-0005SLOWVT: 24
TZON-0011AFLUTTER: 70
TZST-0001FASTVT: 4
TZST-0001SLOWVT: 3
TZST-0001SLOWVT: 4
TZST-0001SLOWVT: 6
TZST-0003FASTVT: 25 J
TZST-0003FASTVT: 35 J
TZST-0003FASTVT: 35 J
TZST-0003FASTVT: 35 J
TZST-0003SLOWVT: 15 J
TZST-0003SLOWVT: 35 J

## 2011-10-15 ENCOUNTER — Other Ambulatory Visit: Payer: Self-pay | Admitting: Pulmonary Disease

## 2011-10-15 NOTE — Telephone Encounter (Signed)
Please advise if ok to refill, thanks 

## 2011-10-27 ENCOUNTER — Encounter: Payer: Self-pay | Admitting: *Deleted

## 2011-11-01 ENCOUNTER — Other Ambulatory Visit: Payer: Self-pay | Admitting: Internal Medicine

## 2011-11-04 ENCOUNTER — Other Ambulatory Visit (INDEPENDENT_AMBULATORY_CARE_PROVIDER_SITE_OTHER): Payer: Medicare Other

## 2011-11-04 ENCOUNTER — Ambulatory Visit (INDEPENDENT_AMBULATORY_CARE_PROVIDER_SITE_OTHER)
Admission: RE | Admit: 2011-11-04 | Discharge: 2011-11-04 | Disposition: A | Discharge status: Home / Self Care | Payer: Medicare Other | Source: Ambulatory Visit | Attending: Pulmonary Disease | Admitting: Pulmonary Disease

## 2011-11-04 ENCOUNTER — Encounter: Payer: Self-pay | Admitting: Pulmonary Disease

## 2011-11-04 ENCOUNTER — Ambulatory Visit (INDEPENDENT_AMBULATORY_CARE_PROVIDER_SITE_OTHER): Payer: Medicare Other | Admitting: Pulmonary Disease

## 2011-11-04 VITALS — BP 112/62 | HR 54 | Temp 97.3°F | Ht 67.0 in | Wt 174.6 lb

## 2011-11-04 DIAGNOSIS — F411 Generalized anxiety disorder: Secondary | ICD-10-CM

## 2011-11-04 DIAGNOSIS — I2510 Atherosclerotic heart disease of native coronary artery without angina pectoris: Secondary | ICD-10-CM

## 2011-11-04 DIAGNOSIS — J449 Chronic obstructive pulmonary disease, unspecified: Secondary | ICD-10-CM

## 2011-11-04 DIAGNOSIS — R0600 Dyspnea, unspecified: Secondary | ICD-10-CM

## 2011-11-04 DIAGNOSIS — Z95810 Presence of automatic (implantable) cardiac defibrillator: Secondary | ICD-10-CM

## 2011-11-04 DIAGNOSIS — I10 Essential (primary) hypertension: Secondary | ICD-10-CM

## 2011-11-04 DIAGNOSIS — E119 Type 2 diabetes mellitus without complications: Secondary | ICD-10-CM

## 2011-11-04 DIAGNOSIS — N259 Disorder resulting from impaired renal tubular function, unspecified: Secondary | ICD-10-CM

## 2011-11-04 DIAGNOSIS — I739 Peripheral vascular disease, unspecified: Secondary | ICD-10-CM

## 2011-11-04 DIAGNOSIS — R0609 Other forms of dyspnea: Secondary | ICD-10-CM

## 2011-11-04 DIAGNOSIS — E7800 Pure hypercholesterolemia, unspecified: Secondary | ICD-10-CM

## 2011-11-04 DIAGNOSIS — I5022 Chronic systolic (congestive) heart failure: Secondary | ICD-10-CM

## 2011-11-04 DIAGNOSIS — M1990 Unspecified osteoarthritis, unspecified site: Secondary | ICD-10-CM

## 2011-11-04 LAB — BASIC METABOLIC PANEL
BUN: 21 mg/dL (ref 6–23)
CO2: 33 mEq/L — ABNORMAL HIGH (ref 19–32)
Chloride: 102 mEq/L (ref 96–112)
Glucose, Bld: 67 mg/dL — ABNORMAL LOW (ref 70–99)
Potassium: 5.5 mEq/L — ABNORMAL HIGH (ref 3.5–5.1)

## 2011-11-04 NOTE — Patient Instructions (Addendum)
Today we updated your med list in our EPIC system...    Continue your current medications the same...    Have your Pharm call for any needed refills & we will authorize routine 79mo supplies...  Today we did your follow up CXR & blood work...    We will call you w/ the results when avail...  Have fun at the beach!!!  Call for any questions...  Let's plan a routine follow up in about 6 months.Marland KitchenMarland Kitchen

## 2011-11-09 ENCOUNTER — Encounter: Payer: Self-pay | Admitting: Pulmonary Disease

## 2011-11-09 NOTE — Progress Notes (Signed)
Subjective:     Patient ID: [name], male   DOB: 1936/01/29, 76 y.o.   MRN: [id number]  HPI 75 y/o WM here for a follow up visit... he has multiple medical problems as noted below...   ~  Sep10:  I last saw him 9/09 for routine f/u visit- stable on his regular dosing of Advair500, Mucinex, & Xopenex... he missed several f/u appts and in the interim stopped his Metoprolol, Vasotec, ?others... he has followed up w/ DrHochrein & DrKlein- doing well, he says, & DrH restarted BBlocker Coreg & Enalapril Rx... he states that he's been regular w/ his Glucovance (2.5/500- 1/2 Bid) although he hasn't checked his BS recently... he also states that he's been taking his Prav80 + Fenofib160 daily (overdue for fasting labs)... I called [facility] on [address]- they confirmed regular refills recently.  ~  Jan11:  he had a bronchitic exac rx'd w/ Avelox, Pred, Mucinex, Tussionex & improved... discussed the need to continue the AdvairBid regularly + the Mucinex Bid, etc... DrHochrein recently incr the Coreg to 12.5Bid & decr the Lasix to 20mg  Qod due to low BP... he has mod seb derm- Rx w/ Selsun, T-gel & Lotrisone cream... ~  Apr11:  he reports a good 76mo- no new complaints or concerns... states he's not taking Lasix regularly, just Prn for swelling since BP is low... he notes occas nocturnal hypoglycenic episodes but has been taking the PM Glucovance Qhs> we discussed changing this to dinnertime dose... no CP, palpit/ resp exacerbations, etc... ~  Oct11:  he has one AB exac 7/11- seen by TP & Rx w/ Avelox, Pred, etc... otherw doing fine- no new complaints or concerns- he's retired & living at [facility] 1/2 the time, playing golf etc... BP controlled;  no CP, palpit, ch in SOB, etc;  due for f/u Appt w/ DrHochrein & CDopplers soon; he is not fasting this AM & will call us when he is ready to do his fasting labs> notes occas low BS if he skips lunch...  ~  November 04, 2011:  72mo ROV & [name] states that he  is doing fine- no new complaints or concerns, he didn't even bring his meds or med list for review...     He saw DrAllred 1/13> doing well w/o CP, palpit, SOB, orthop, PND, etc; ICD interrogated & functioning normally, no med changes made...    He saw DrHochrein 1/13> doing satis & relatively asymptomatic but not exercising; rec to continue aggressive risk reduction strategy; same meds...    We reviewed prob list, meds, xrays and labs> see below>> CXR 6/13 showed stable heart size s/p CABG, pacer, hyperinflated/ clear lungs w/ interstitial prominence, DJD in TSpine... LABS 2/13 by DrHochrein:  TChol 136, TG 105, HDL 31, LDL 84 LABS 6/13:  Chems- WU=98 A1c=7.0 BUN=21 creat=1.4 K=5.5    Problem List:  ASTHMATIC BRONCHITIS, ACUTE (ICD-466.0) - occas acute infectious exacerbations... ~  12/10: had bronchitic exac Rx'd w/ Avelox, Pred, Mucinex, etc & improved==> CXR showed COPD/E, incr interstitial markings & scarring, calcif pleural plaques, enlarged heart & pacer, NAD.Marland Kitchen.  ~  7/11:  similar episode, responded to similar meds...  COPD (ICD-496) - ex-smoker, quit 2002... he clearly does better on regular dosing of ADVAIR 500Bid, MUCINEX 1200mg Bid, XOPENEX Prn... urged to continue these regularly and get more regular exercise... ~  CXR 6/13 showed stable heart size s/p CABG, pacer, hyperinflated/ clear lungs w/ interstitial prominence, DJD in TSpine...  HYPERTENSION (ICD-401.9) - meds for BP &  his Chr CHF= COREG 12.5mg Bid,  ENALAPRIL 2.5mg /d, & LASIX 20mg  Qod (he's only taking this Prn swelling)...   ~  labs 1/10 showed K= 5.6, BUN= 31, Creat= 1.9..Marland Kitchen ACE/ ARB stopped... pt didn't follow up. ~  8/10: Coreg + Enalapril restarted by DrHochrein... ~  labs 9/10 showed K=5.6, BUN=19, Creat=1.5 ~  labs 12/10 showed K=4.4, BUN= 32, Creat= 1.7, BNP= 102 ~  labs 4/11 showed K=5.1, BUN=16, Creat=1.4 ~  10/11: BP=122/68 today, and feeling well- he denies HA, fatigue, visual changes, CP, palipit, dizziness,  syncope, edema, etc. ~  labs 2/13 showed K=5.7, BUN=27, Creat=1.5 ~  6/13: BP= 112/62 and he denies CP, palpit, dizzy, syncope, ch in SOB, edema... ~  Labs 6/13 showed K=5.5, BUN=21, Creat=1.4  CORONARY ARTERY DISEASE (ICD-414.00) - no chest pains, no palpit, no defib discharges... ~ Hx 3 vessel CAD w/ CABG x4 in 2/03 for LMain dis & 3vessel dis + mod LVD EF=35% at that time... ~ Cath 7/06 w/ non-filling of SVG to PDA, & LIMA to LAD was atretic, w/ EF=45%... ~ Cath 10/08 w/ norm SVG to PDA, & atretic LIMA to LAD (due to good native vessel flow), EF=40%... note is made of a 40-50% LMain lesion... ~  EKG 1/13 showed SBrady, rightward axis, IVCD, NSSTTWA...  CONGESTIVE HEART FAILURE, SYSTOLIC, CHRONIC (ICD-428.0) - ischemic cardiomyopathy & meds as above... followed by DrHochrein in the CHF clinic...  see labs above>> ~  seen 8/10 in CHF clinic- Coreg + Enalapril restarted, labs reviewed. ~  2DEcho 2/13 showed LV mildly dilated w/ diffuse HK, incr wall thickness c/w mild LVH, EF=35-40%, mild MR, mild LAdil... ~  Labs 6/13 showed Creat=1.4 & BNP=83  Hx of VENTRICULAR TACHYCARDIA (ICD-427.1) - prev documented medication non-compliance w/ Amio & Mexilitene... DrHochrein has adjusted his meds... ~ hx VTach storm w/ AICD placed, followed by DrTaylor/Klein on meds... ~ DC'd 10/08 on Amiodarone 200mg - 2 tabs twice daily, and MEXILETINE 150mg Bid... subseq decr Amio 200mg Bid... ~ f/u w/ DrKlein 9/09 w/ defib check- doing well... ~  f/u 9/10 w/ DrKlein on Mexilitene alone, Amio stopped for intolerance... doing well- no changes made. ~  f/u 1/13 w/ DrAllred> stable on Mexilitene, & no changes made...  PERIPHERAL VASCULAR DISEASE (ICD-443.9) ~ s/p PTA to right SFA for an ABI of 0.36...  ~  CDopplers 10/09 showed bilat carotid disease w/ bruits and mod plaque in bulbs, 0-39% bilat... no change. ~  CDopplers 11/11 showed mod heterogeneous plaque in bulbs, 0-39% bilat ICA stenoses, f/u  38yrs...  HYPERCHOLESTEROLEMIA (ICD-272.0) - prev on Simvastatin 40mg /d, and changed to PRAVASTATIN 80mg /d per DrHochrein (due to interaction of Simva & Amio)... FENOFIBRATE 160mg /d added 8/09... ~  FLP 8/08 on Simva40 showed TChol 136, TG 297, HDL 22, LDL 69- but he was ?not taking meds regularly? he freq forgets. ~  FLP 8/09 on Prav80 showed TChol 124, TG 257, HDL 24, LDL 65... rec to add Fenofibrate 160/d... ~  FLP 9/10 on Prav80+Feno160 showed TChol 137, TG 142, HDL 26, LDL 83... continue both meds. ~  FLP 4/11 on Prav80+Feno160 showed TChol 131, TG 139, HDL 33, LDL 70 ~  FLP 10/11 = pt didn't go to the lab for blood work... ~  FLP 2/13 on Prav80+Feno160 showed TChol 136, TG 105, HDL 31, LDL 84  DIABETES MELLITUS (ICD-250.00) - on GLUCOVANCE 2.5/500- 1/2 Bid... he states his home BS checks have all been around 100 w/ occas nocturnal hypoglycemia> advised to take PM dose at dinner, not bedtime. ~  prev BS=177, HgA1c=7.0 in Oct08... ~  labs 2/09 showed BS=83, HgA1c=6.8 ~  labs 8/09 showed BS= 118, HgA1c= 7.3 ~  labs 9/10 showed BS= 90, A1c= 7.2 ~  labs 4/11 showed BS= 65, A1c= 6.9 ~  labs 2/13 showed BS= 76-171 ~  Labs 6/13 showed BS= 67, A1c=7.0  DIVERTICULOSIS OF COLON (ICD-562.10) - last colonoscopy was 10/02 by [facility] showing divertics only... prev polypectomy 1997 for tubular adenoma... overdue for f/u exam... COLONIC POLYPS (ICD-211.3)  RENAL INSUFFICIENCY (ICD-588.9) - Creat in the 1.6-1.7 range... ~  labs 8/09 showed BUN= 14, Creat= 1.2 ~  labs 1/10 showed BUN= 31, Creat= 1.9 & ACE was held... f/u improved. ~  labs 9/10 showed BUN= 19, Creat= 1.5 ~  labs 4/11 showed BUN= 16, Creat= 1.4 ~  Labs 2/13 showed BUN= 27, Creat= 1.5 ~  Labs 6/13 showed BUN= 21, Creat= 1.4  DEGENERATIVE JOINT DISEASE (ICD-715.90) ~  2/12:  Seen by DrWainer w/ bilat shoulder pain; XRays showed glenohumeral degen changes; given bilat injections w/ relief...  ANXIETY (ICD-300.00)  SEBORRHEIC  DERMATITIS (ICD-690.10) - he has mod seb derm- discussed Rx w/ Selsun/ T-Gel/ Lotrisone Cream Prn...   Past Surgical History  Procedure Date  . Coronary artery bypass graft   . Nsertion of a single chamber defibrillator- medtronic maxima 8/06 drklein     Outpatient Encounter Prescriptions as of 11/04/2011  Medication Sig Dispense Refill  . aspirin 325 MG EC tablet Take 325 mg by mouth daily.        . carvedilol (COREG) 12.5 MG tablet Take 1 tablet (12.5 mg total) by mouth 2 (two) times daily with a meal.  180 tablet  3  . enalapril (VASOTEC) 5 MG tablet Take 1 tablet (5 mg total) by mouth 2 (two) times daily.  180 tablet  3  . fenofibrate 160 MG tablet Take 1 tablet (160 mg total) by mouth daily.  90 tablet  3  . Fluticasone-Salmeterol (ADVAIR DISKUS) 500-50 MCG/DOSE AEPB INHALE ONE DOSE BY MOUTH TWICE DAILY  60 each  1  . furosemide (LASIX) 20 MG tablet Take 1 tablet (20 mg total) by mouth as needed.  90 tablet  1  . glyBURIDE-metformin (GLUCOVANCE) 2.5-500 MG per tablet TAKE ONE-HALF TABLET BY MOUTH TWICE DAILY WITH BREAKFAST AND DINNER  30 tablet  0  . guaiFENesin (MUCINEX) 600 MG 12 hr tablet Take 1,200 mg by mouth 2 (two) times daily.        Marland Kitchen levalbuterol (XOPENEX HFA) 45 MCG/ACT inhaler Inhale 1-2 puffs into the lungs every 4 (four) hours as needed.        . mexiletine (MEXITIL) 150 MG capsule TAKE ONE CAPSULE BY MOUTH TWICE DAILY  60 capsule  6  . pravastatin (PRAVACHOL) 80 MG tablet Take 1 tablet (80 mg total) by mouth daily.  90 tablet  3  . DISCONTD: glyBURIDE-metformin (GLUCOVANCE) 2.5-500 MG per tablet TAKE ONE-HALF TABLET BY MOUTH TWICE DAILY WITH BREAKFAST AND DINNER  90 tablet  0    Allergies  Allergen Reactions  . Niacin     REACTION: INTOL to Niaspan per pt    Current Medications, Allergies, Past Medical History, Past Surgical History, Family History, and Social History were reviewed in [employer] record.   Review of Systems        See HPI  - all other systems neg except as noted...       The patient complains of dyspnea on exertion.  The patient denies anorexia, fever,  weight loss, weight gain, vision loss, decreased hearing, hoarseness, chest pain, syncope, peripheral edema, prolonged cough, headaches, hemoptysis, abdominal pain, melena, hematochezia, severe indigestion/heartburn, hematuria, incontinence, muscle weakness, suspicious skin lesions, transient blindness, difficulty walking, depression, unusual weight change, abnormal bleeding, enlarged lymph nodes, and angioedema.     Objective:   Physical Exam    WD, WN, 76 y/o WM in NAD... GENERAL:  Alert & oriented; pleasant & cooperative... HEENT:  [place]/AT, EOM-wnl, PERRLA, EACs-clear, TMs-wnl, NOSE-clear, THROAT-clear & wnl. NECK:  Supple w/ fairROM; no JVD; normal carotid impulses w/o bruits; no thyromegaly or nodules palpated; no lymphadenopathy. CHEST:  Few scat rhonchi without wheezing, rales, or signs of consolid... HEART:  Regular Rhythm;  gr1/6 SEM, without rubs or gallops heard... ABDOMEN:  Soft & nontender; normal bowel sounds; no organomegaly or masses detected. EXT: without deformities, mild arthritic changes; no varicose veins/ venous insuffic/ or edema. NEURO:  CN's intact; motor testing normal; sensory testing normal; gait normal & balance OK. DERM:  No lesions noted; no rash etc...  RADIOLOGY DATA:  Reviewed in the EPIC EMR & discussed w/ the patient...  LABORATORY DATA:  Reviewed in the EPIC EMR & discussed w/ the patient...    Assessment:     COPD/ AB>  Stable on Advair, Xopenex, Mucinex; asked to take meds regularly regularly & incr exercise...  HBP>  Controlled on meds, continue same, asked to monitor at home...  CAD>  S/p CABG, he is too sedentary & asked to incr exercise...  CHF, Cardiomyopathy>  Followed by DrHochrein; continue meds...  Hx VTach>  Stable on Mexilitene & has AICD monitored by DrKlein et al...  ASPVD>  He is s/p right SFA PTA &  doing satis, but needs to incr exercise...  CHOL>  On Prav80 + Feno160; last FLP by DrHochrein 2/13 looked good x lowHDL needs incr exercise...  DM>  On diet + Glucovance; A1c is stable...  Renal Insuffic>  Creat= 1.4 is stable on current meds...  DJD>  Aware, stable on OTC meds...  Anxiety>  Stable & not on anxiolytic Rx...     Plan:     Patient's Medications  New Prescriptions   No medications on file  Previous Medications   ASPIRIN 325 MG EC TABLET    Take 325 mg by mouth daily.     CARVEDILOL (COREG) 12.5 MG TABLET    Take 1 tablet (12.5 mg total) by mouth 2 (two) times daily with a meal.   ENALAPRIL (VASOTEC) 5 MG TABLET    Take 1 tablet (5 mg total) by mouth 2 (two) times daily.   FENOFIBRATE 160 MG TABLET    Take 1 tablet (160 mg total) by mouth daily.   FLUTICASONE-SALMETEROL (ADVAIR DISKUS) 500-50 MCG/DOSE AEPB    INHALE ONE DOSE BY MOUTH TWICE DAILY   FUROSEMIDE (LASIX) 20 MG TABLET    Take 1 tablet (20 mg total) by mouth as needed.   GLYBURIDE-METFORMIN (GLUCOVANCE) 2.5-500 MG PER TABLET    TAKE ONE-HALF TABLET BY MOUTH TWICE DAILY WITH BREAKFAST AND DINNER   GUAIFENESIN (MUCINEX) 600 MG 12 HR TABLET    Take 1,200 mg by mouth 2 (two) times daily.     LEVALBUTEROL (XOPENEX HFA) 45 MCG/ACT INHALER    Inhale 1-2 puffs into the lungs every 4 (four) hours as needed.     MEXILETINE (MEXITIL) 150 MG CAPSULE    TAKE ONE CAPSULE BY MOUTH TWICE DAILY   PRAVASTATIN (PRAVACHOL) 80 MG TABLET    Take 1 tablet (80  mg total) by mouth daily.  Modified Medications   No medications on file  Discontinued Medications   GLYBURIDE-METFORMIN (GLUCOVANCE) 2.5-500 MG PER TABLET    TAKE ONE-HALF TABLET BY MOUTH TWICE DAILY WITH BREAKFAST AND DINNER

## 2011-11-18 ENCOUNTER — Telehealth: Payer: Self-pay | Admitting: Pulmonary Disease

## 2011-11-18 NOTE — Telephone Encounter (Signed)
Called and spoke with pt about his lab results.  Pt stated that he has a 6 month follow up scheduled with SN. Nothing further needed.

## 2011-11-20 ENCOUNTER — Other Ambulatory Visit: Payer: Self-pay | Admitting: Pulmonary Disease

## 2012-01-15 ENCOUNTER — Ambulatory Visit (INDEPENDENT_AMBULATORY_CARE_PROVIDER_SITE_OTHER): Payer: Medicare Other | Admitting: *Deleted

## 2012-01-15 ENCOUNTER — Encounter: Payer: Self-pay | Admitting: Internal Medicine

## 2012-01-15 DIAGNOSIS — I472 Ventricular tachycardia: Secondary | ICD-10-CM

## 2012-01-15 DIAGNOSIS — I4729 Other ventricular tachycardia: Secondary | ICD-10-CM

## 2012-01-15 DIAGNOSIS — Z95810 Presence of automatic (implantable) cardiac defibrillator: Secondary | ICD-10-CM

## 2012-01-16 LAB — REMOTE ICD DEVICE
RV LEAD AMPLITUDE: 7.9 mv
TZAT-0001SLOWVT: 1
TZAT-0001SLOWVT: 2
TZAT-0005SLOWVT: 88 pct
TZAT-0012FASTVT: 200 ms
TZAT-0013SLOWVT: 4
TZAT-0013SLOWVT: 4
TZAT-0018FASTVT: NEGATIVE
TZAT-0018SLOWVT: NEGATIVE
TZAT-0018SLOWVT: NEGATIVE
TZAT-0019FASTVT: 8 V
TZAT-0019SLOWVT: 8 V
TZAT-0019SLOWVT: 8 V
TZAT-0020FASTVT: 1.6 ms
TZON-0003FASTVT: 240 ms
TZON-0004SLOWVT: 44
TZON-0005SLOWVT: 24
TZON-0008FASTVT: 0 ms
TZON-0008SLOWVT: 0 ms
TZST-0001FASTVT: 4
TZST-0001FASTVT: 6
TZST-0001SLOWVT: 3
TZST-0001SLOWVT: 4
TZST-0001SLOWVT: 6
TZST-0003FASTVT: 25 J
TZST-0003FASTVT: 35 J
TZST-0003FASTVT: 35 J
TZST-0003SLOWVT: 35 J
TZST-0003SLOWVT: 35 J

## 2012-02-05 ENCOUNTER — Encounter: Payer: Self-pay | Admitting: *Deleted

## 2012-02-17 ENCOUNTER — Other Ambulatory Visit: Payer: Self-pay | Admitting: Pulmonary Disease

## 2012-04-05 ENCOUNTER — Encounter: Payer: Self-pay | Admitting: Adult Health

## 2012-04-05 ENCOUNTER — Ambulatory Visit (INDEPENDENT_AMBULATORY_CARE_PROVIDER_SITE_OTHER): Payer: Medicare Other | Admitting: Adult Health

## 2012-04-05 ENCOUNTER — Ambulatory Visit (INDEPENDENT_AMBULATORY_CARE_PROVIDER_SITE_OTHER)
Admission: RE | Admit: 2012-04-05 | Discharge: 2012-04-05 | Disposition: A | Discharge status: Home / Self Care | Payer: Medicare Other | Source: Ambulatory Visit | Attending: Adult Health | Admitting: Adult Health

## 2012-04-05 VITALS — BP 124/76 | HR 72 | Temp 97.1°F | Ht 67.0 in | Wt 174.4 lb

## 2012-04-05 DIAGNOSIS — Z23 Encounter for immunization: Secondary | ICD-10-CM

## 2012-04-05 DIAGNOSIS — J449 Chronic obstructive pulmonary disease, unspecified: Secondary | ICD-10-CM

## 2012-04-05 MED ORDER — LEVALBUTEROL HCL 0.63 MG/3ML IN NEBU
0.6300 mg | INHALATION_SOLUTION | Freq: Once | RESPIRATORY_TRACT | Status: AC
  Administered 2012-04-05: 0.63 mg via RESPIRATORY_TRACT

## 2012-04-05 MED ORDER — PREDNISONE 10 MG PO TABS: ORAL_TABLET | ORAL | Status: DC

## 2012-04-05 MED ORDER — AMOXICILLIN-POT CLAVULANATE 875-125 MG PO TABS: 1.0000 | ORAL_TABLET | Freq: Two times a day (BID) | ORAL | Status: AC

## 2012-04-05 NOTE — Assessment & Plan Note (Signed)
Exacerbation w/ associated desaturations CXR w/ no acute process  sats rechecked at rest on room air 95 %  Will have him return for close follow up and evaluation of O2 sats  xopenex neb given in office   Plan Augmentin 875mg  Twice daily  For 7 days  Prednisone taper over next week.  Mucinex DM Twice daily  As needed  For cough/congestion  Fluids and rest  follow up in 3-4 days in office with Dr. Kriste Basque  Or Asaph Serena NP  Please contact office for sooner follow up if symptoms do not improve or worsen or seek emergency care

## 2012-04-05 NOTE — Patient Instructions (Addendum)
Augmentin 875mg  Twice daily  For 7 days  Prednisone taper over next week.  Mucinex DM Twice daily  As needed  For cough/congestion  Fluids and rest  follow up in 3-4 days in office with Dr. Kriste Basque  Or Levonne Carreras NP  Please contact office for sooner follow up if symptoms do not improve or worsen or seek emergency care

## 2012-04-05 NOTE — Addendum Note (Signed)
Addended by: Boone Master E on: 04/05/2012 05:18 PM   Modules accepted: Orders

## 2012-04-05 NOTE — Progress Notes (Signed)
Subjective:     Patient ID: [name], male   DOB: 26-Oct-1935, 76 y.o.   MRN: [id number]  HPI  76 y/o WM    ~  Sep10:  I last saw him 9/09 for routine f/u visit- stable on his regular dosing of Advair500, Mucinex, & Xopenex... he missed several f/u appts and in the interim stopped his Metoprolol, Vasotec, ?others... he has followed up w/ DrHochrein & DrKlein- doing well, he says, & DrH restarted BBlocker Coreg & Enalapril Rx... he states that he's been regular w/ his Glucovance (2.5/500- 1/2 Bid) although he hasn't checked his BS recently... he also states that he's been taking his Prav80 + Fenofib160 daily (overdue for fasting labs)... I called [facility] on [address]- they confirmed regular refills recently.  ~  Jan11:  he had a bronchitic exac rx'd w/ Avelox, Pred, Mucinex, Tussionex & improved... discussed the need to continue the AdvairBid regularly + the Mucinex Bid, etc... DrHochrein recently incr the Coreg to 12.5Bid & decr the Lasix to 20mg  Qod due to low BP... he has mod seb derm- Rx w/ Selsun, T-gel & Lotrisone cream... ~  Apr11:  he reports a good 21mo- no new complaints or concerns... states he's not taking Lasix regularly, just Prn for swelling since BP is low... he notes occas nocturnal hypoglycenic episodes but has been taking the PM Glucovance Qhs> we discussed changing this to dinnertime dose... no CP, palpit/ resp exacerbations, etc... ~  Oct11:  he has one AB exac 7/11- seen by TP & Rx w/ Avelox, Pred, etc... otherw doing fine- no new complaints or concerns- he's retired & living at [facility] 1/2 the time, playing golf etc... BP controlled;  no CP, palpit, ch in SOB, etc;  due for f/u Appt w/ DrHochrein & CDopplers soon; he is not fasting this AM & will call us when he is ready to do his fasting labs> notes occas low BS if he skips lunch...  ~  November 04, 2011:  56mo ROV & [name] states that he is doing fine- no new complaints or concerns, he didn't even bring his meds or  med list for review...     He saw DrAllred 1/13> doing well w/o CP, palpit, SOB, orthop, PND, etc; ICD interrogated & functioning normally, no med changes made...    He saw DrHochrein 1/13> doing satis & relatively asymptomatic but not exercising; rec to continue aggressive risk reduction strategy; same meds...    We reviewed prob list, meds, xrays and labs> see below>> CXR 6/13 showed stable heart size s/p CABG, pacer, hyperinflated/ clear lungs w/ interstitial prominence, DJD in TSpine... LABS 2/13 by DrHochrein:  TChol 136, TG 105, HDL 31, LDL 84 LABS 6/13:  Chems- KV=42 A1c=7.0 BUN=21 creat=1.4 K=5.5   04/05/2012 Acute OV  Complains of increased SOB, wheezing, occ chest tightness, prod cough light brown/clear mucus x5 days.  O2 sats 85% RA upon arrival to exam room, no O2 at home. Sats improved to 94% on 2 L/m  Pt was given a xopenex neb tx in office , w/ improvement .  CXR today with no acute process. Chronic changes.  Sats rechecked at rest on room air after neb at 95% .  OTC cold meds not helping .  No n/v/d. Appetite is good. No edema or hemoptysis.  No fever .  Of note on ACE inhibitor .   Problem List:  ASTHMATIC BRONCHITIS, ACUTE (ICD-466.0) - occas acute infectious exacerbations... ~  12/10: had bronchitic exac Rx'd  w/ Avelox, Pred, Mucinex, etc & improved==> CXR showed COPD/E, incr interstitial markings & scarring, calcif pleural plaques, enlarged heart & pacer, NAD.Marland Kitchen.  ~  7/11:  similar episode, responded to similar meds...  COPD (ICD-496) - ex-smoker, quit 2002... he clearly does better on regular dosing of ADVAIR 500Bid, MUCINEX 1200mg Bid, XOPENEX Prn... urged to continue these regularly and get more regular exercise... ~  CXR 6/13 showed stable heart size s/p CABG, pacer, hyperinflated/ clear lungs w/ interstitial prominence, DJD in TSpine...  HYPERTENSION (ICD-401.9) - meds for BP & his Chr CHF= COREG 12.5mg Bid,  ENALAPRIL 2.5mg /d, & LASIX 20mg  Qod (he's only taking  this Prn swelling)...   ~  labs 1/10 showed K= 5.6, BUN= 31, Creat= 1.9..Marland Kitchen ACE/ ARB stopped... pt didn't follow up. ~  8/10: Coreg + Enalapril restarted by DrHochrein... ~  labs 9/10 showed K=5.6, BUN=19, Creat=1.5 ~  labs 12/10 showed K=4.4, BUN= 32, Creat= 1.7, BNP= 102 ~  labs 4/11 showed K=5.1, BUN=16, Creat=1.4 ~  10/11: BP=122/68 today, and feeling well- he denies HA, fatigue, visual changes, CP, palipit, dizziness, syncope, edema, etc. ~  labs 2/13 showed K=5.7, BUN=27, Creat=1.5 ~  6/13: BP= 112/62 and he denies CP, palpit, dizzy, syncope, ch in SOB, edema... ~  Labs 6/13 showed K=5.5, BUN=21, Creat=1.4  CORONARY ARTERY DISEASE (ICD-414.00) - no chest pains, no palpit, no defib discharges... ~ Hx 3 vessel CAD w/ CABG x4 in 2/03 for LMain dis & 3vessel dis + mod LVD EF=35% at that time... ~ Cath 7/06 w/ non-filling of SVG to PDA, & LIMA to LAD was atretic, w/ EF=45%... ~ Cath 10/08 w/ norm SVG to PDA, & atretic LIMA to LAD (due to good native vessel flow), EF=40%... note is made of a 40-50% LMain lesion... ~  EKG 1/13 showed SBrady, rightward axis, IVCD, NSSTTWA...  CONGESTIVE HEART FAILURE, SYSTOLIC, CHRONIC (ICD-428.0) - ischemic cardiomyopathy & meds as above... followed by DrHochrein in the CHF clinic...  see labs above>> ~  seen 8/10 in CHF clinic- Coreg + Enalapril restarted, labs reviewed. ~  2DEcho 2/13 showed LV mildly dilated w/ diffuse HK, incr wall thickness c/w mild LVH, EF=35-40%, mild MR, mild LAdil... ~  Labs 6/13 showed Creat=1.4 & BNP=83  Hx of VENTRICULAR TACHYCARDIA (ICD-427.1) - prev documented medication non-compliance w/ Amio & Mexilitene... DrHochrein has adjusted his meds... ~ hx VTach storm w/ AICD placed, followed by DrTaylor/Klein on meds... ~ DC'd 10/08 on Amiodarone 200mg - 2 tabs twice daily, and MEXILETINE 150mg Bid... subseq decr Amio 200mg Bid... ~ f/u w/ DrKlein 9/09 w/ defib check- doing well... ~  f/u 9/10 w/ DrKlein on Mexilitene alone, Amio stopped  for intolerance... doing well- no changes made. ~  f/u 1/13 w/ DrAllred> stable on Mexilitene, & no changes made...  PERIPHERAL VASCULAR DISEASE (ICD-443.9) ~ s/p PTA to right SFA for an ABI of 0.36...  ~  CDopplers 10/09 showed bilat carotid disease w/ bruits and mod plaque in bulbs, 0-39% bilat... no change. ~  CDopplers 11/11 showed mod heterogeneous plaque in bulbs, 0-39% bilat ICA stenoses, f/u 51yrs...  HYPERCHOLESTEROLEMIA (ICD-272.0) - prev on Simvastatin 40mg /d, and changed to PRAVASTATIN 80mg /d per DrHochrein (due to interaction of Simva & Amio)... FENOFIBRATE 160mg /d added 8/09... ~  FLP 8/08 on Simva40 showed TChol 136, TG 297, HDL 22, LDL 69- but he was ?not taking meds regularly? he freq forgets. ~  FLP 8/09 on Prav80 showed TChol 124, TG 257, HDL 24, LDL 65... rec to add Fenofibrate 160/d... ~  FLP 9/10 on  Prav80+Feno160 showed TChol 137, TG 142, HDL 26, LDL 83... continue both meds. ~  FLP 4/11 on Prav80+Feno160 showed TChol 131, TG 139, HDL 33, LDL 70 ~  FLP 10/11 = pt didn't go to the lab for blood work... ~  FLP 2/13 on Prav80+Feno160 showed TChol 136, TG 105, HDL 31, LDL 84  DIABETES MELLITUS (ICD-250.00) - on GLUCOVANCE 2.5/500- 1/2 Bid... he states his home BS checks have all been around 100 w/ occas nocturnal hypoglycemia> advised to take PM dose at dinner, not bedtime. ~   prev BS=177, HgA1c=7.0 in Oct08... ~  labs 2/09 showed BS=83, HgA1c=6.8 ~  labs 8/09 showed BS= 118, HgA1c= 7.3 ~  labs 9/10 showed BS= 90, A1c= 7.2 ~  labs 4/11 showed BS= 65, A1c= 6.9 ~  labs 2/13 showed BS= 76-171 ~  Labs 6/13 showed BS= 67, A1c=7.0  DIVERTICULOSIS OF COLON (ICD-562.10) - last colonoscopy was 10/02 by [facility] showing divertics only... prev polypectomy 1997 for tubular adenoma... overdue for f/u exam... COLONIC POLYPS (ICD-211.3)  RENAL INSUFFICIENCY (ICD-588.9) - Creat in the 1.6-1.7 range... ~  labs 8/09 showed BUN= 14, Creat= 1.2 ~  labs 1/10 showed BUN= 31, Creat= 1.9 &  ACE was held... f/u improved. ~  labs 9/10 showed BUN= 19, Creat= 1.5 ~  labs 4/11 showed BUN= 16, Creat= 1.4 ~  Labs 2/13 showed BUN= 27, Creat= 1.5 ~  Labs 6/13 showed BUN= 21, Creat= 1.4  DEGENERATIVE JOINT DISEASE (ICD-715.90) ~  2/12:  Seen by DrWainer w/ bilat shoulder pain; XRays showed glenohumeral degen changes; given bilat injections w/ relief...  ANXIETY (ICD-300.00)  SEBORRHEIC DERMATITIS (ICD-690.10) - he has mod seb derm- discussed Rx w/ Selsun/ T-Gel/ Lotrisone Cream Prn...   Past Surgical History  Procedure Date  . Coronary artery bypass graft   . Nsertion of a single chamber defibrillator- medtronic maxima 8/06 drklein     Outpatient Encounter Prescriptions as of 04/05/2012  Medication Sig Dispense Refill  . ADVAIR DISKUS 500-50 MCG/DOSE AEPB INHALE ONE DOSE BY MOUTH TWICE DAILY  60 each  6  . aspirin 325 MG EC tablet Take 325 mg by mouth daily.        . carvedilol (COREG) 12.5 MG tablet Take 1 tablet (12.5 mg total) by mouth 2 (two) times daily with a meal.  180 tablet  3  . enalapril (VASOTEC) 5 MG tablet Take 1 tablet (5 mg total) by mouth 2 (two) times daily.  180 tablet  3  . fenofibrate 160 MG tablet Take 1 tablet (160 mg total) by mouth daily.  90 tablet  3  . furosemide (LASIX) 20 MG tablet Take 1 tablet (20 mg total) by mouth as needed.  90 tablet  1  . glyBURIDE-metformin (GLUCOVANCE) 2.5-500 MG per tablet TAKE ONE-HALF TABLET BY MOUTH TWICE DAILY WITH BREAKFAST AND DINNER  30 tablet  6  . guaiFENesin (MUCINEX) 600 MG 12 hr tablet Take 1,200 mg by mouth 2 (two) times daily.        Marland Kitchen levalbuterol (XOPENEX HFA) 45 MCG/ACT inhaler Inhale 1-2 puffs into the lungs every 4 (four) hours as needed.        . mexiletine (MEXITIL) 150 MG capsule TAKE ONE CAPSULE BY MOUTH TWICE DAILY  60 capsule  6  . pravastatin (PRAVACHOL) 80 MG tablet Take 1 tablet (80 mg total) by mouth daily.  90 tablet  3    Allergies  Allergen Reactions  . Niacin     REACTION: INTOL to  Niaspan  per pt    Current Medications, Allergies, Past Medical History, Past Surgical History, Family History, and Social History were reviewed in [employer] record.   Review of Systems Constitutional:   No  weight loss, night sweats,  Fevers, chills, fatigue, or  lassitude.  HEENT:   No headaches,  Difficulty swallowing,  Tooth/dental problems, or  Sore throat,                No sneezing, itching, ear ache,  +nasal congestion, post nasal drip,   CV:  No chest pain,  Orthopnea, PND, swelling in lower extremities, anasarca, dizziness, palpitations, syncope.   GI  No heartburn, indigestion, abdominal pain, nausea, vomiting, diarrhea, change in bowel habits, loss of appetite, bloody stools.   Resp:   No coughing up of blood.     No chest wall deformity  Skin: no rash or lesions.  GU: no dysuria, change in color of urine, no urgency or frequency.  No flank pain, no hematuria   MS:  No joint pain or swelling.  No decreased range of motion.  No back pain.  Psych:  No change in mood or affect. No depression or anxiety.  No memory loss.                 Objective:   Physical Exam     WD, WN, 76 y/o WM in NAD... GENERAL:  Alert & oriented; pleasant & cooperative... HEENT:  [place]/AT,   EACs-clear, TMs-wnl, NOSE-clear, THROAT-clear & wnl. NECK:  Supple w/ fairROM; no JVD; normal carotid impulses w/o bruits; no thyromegaly or nodules palpated; no lymphadenopathy. CHEST:  Few scat rhonchi w/ exp wheezing  HEART:  Regular Rhythm;  gr1/6 SEM, without rubs or gallops heard... ABDOMEN:  Soft & nontender; normal bowel sounds; no organomegaly or masses detected. EXT: without deformities, mild arthritic changes; no varicose veins/ venous insuffic/ or edema. NEURO:   gait normal & balance OK. DERM:  No lesions noted; no rash etc...   CXR 04/05/2012  Enlargement of cardiac silhouette post CABG and AICD.  Changes of COPD with stable calcified pleural plaque disease        Assessment:

## 2012-04-08 ENCOUNTER — Encounter: Payer: Self-pay | Admitting: Adult Health

## 2012-04-08 ENCOUNTER — Ambulatory Visit (INDEPENDENT_AMBULATORY_CARE_PROVIDER_SITE_OTHER): Payer: Medicare Other | Admitting: Adult Health

## 2012-04-08 VITALS — BP 100/52 | HR 84 | Temp 96.8°F | Ht 67.0 in | Wt 173.0 lb

## 2012-04-08 DIAGNOSIS — J209 Acute bronchitis, unspecified: Secondary | ICD-10-CM

## 2012-04-08 NOTE — Assessment & Plan Note (Signed)
Slow to resolve COPD/AB flare -improving on abx and steroid taper  sats are improved  CXR w/ no acute process   Plan Finish Augmentin and Prednisone  Mucinex DM Twice daily  As needed  For cough/congestion  Fluids and rest  follow up in 3 weeks with  Dr. Kriste Basque  As planned  Please contact office for sooner follow up if symptoms do not improve or worsen or seek emergency care

## 2012-04-08 NOTE — Progress Notes (Signed)
Subjective:     Patient ID: [name], male   DOB: April 28, 1936, 76 y.o.   MRN: [id number]  HPI  76 y/o WM    ~  Sep10:  I last saw him 9/09 for routine f/u visit- stable on his regular dosing of Advair500, Mucinex, & Xopenex... he missed several f/u appts and in the interim stopped his Metoprolol, Vasotec, ?others... he has followed up w/ DrHochrein & DrKlein- doing well, he says, & DrH restarted BBlocker Coreg & Enalapril Rx... he states that he's been regular w/ his Glucovance (2.5/500- 1/2 Bid) although he hasn't checked his BS recently... he also states that he's been taking his Prav80 + Fenofib160 daily (overdue for fasting labs)... I called [facility] on [address]- they confirmed regular refills recently.  ~  Jan11:  he had a bronchitic exac rx'd w/ Avelox, Pred, Mucinex, Tussionex & improved... discussed the need to continue the AdvairBid regularly + the Mucinex Bid, etc... DrHochrein recently incr the Coreg to 12.5Bid & decr the Lasix to 20mg  Qod due to low BP... he has mod seb derm- Rx w/ Selsun, T-gel & Lotrisone cream... ~  Apr11:  he reports a good 73mo- no new complaints or concerns... states he's not taking Lasix regularly, just Prn for swelling since BP is low... he notes occas nocturnal hypoglycenic episodes but has been taking the PM Glucovance Qhs> we discussed changing this to dinnertime dose... no CP, palpit/ resp exacerbations, etc... ~  Oct11:  he has one AB exac 7/11- seen by TP & Rx w/ Avelox, Pred, etc... otherw doing fine- no new complaints or concerns- he's retired & living at [facility] 1/2 the time, playing golf etc... BP controlled;  no CP, palpit, ch in SOB, etc;  due for f/u Appt w/ DrHochrein & CDopplers soon; he is not fasting this AM & will call us when he is ready to do his fasting labs> notes occas low BS if he skips lunch...  ~  November 04, 2011:  34mo ROV & Konrad states that he is doing fine- no new complaints or concerns, he didn't even bring his meds or  med list for review...     He saw DrAllred 1/13> doing well w/o CP, palpit, SOB, orthop, PND, etc; ICD interrogated & functioning normally, no med changes made...    He saw DrHochrein 1/13> doing satis & relatively asymptomatic but not exercising; rec to continue aggressive risk reduction strategy; same meds...    We reviewed prob list, meds, xrays and labs> see below>> CXR 6/13 showed stable heart size s/p CABG, pacer, hyperinflated/ clear lungs w/ interstitial prominence, DJD in TSpine... LABS 2/13 by DrHochrein:  TChol 136, TG 105, HDL 31, LDL 84 LABS 6/13:  Chems- WU=98 A1c=7.0 BUN=21 creat=1.4 K=5.5   04/05/2012 Acute OV  Complains of increased SOB, wheezing, occ chest tightness, prod cough light brown/clear mucus x5 days.  O2 sats 85% RA upon arrival to exam room, no O2 at home. Sats improved to 94% on 2 L/m  Pt was given a xopenex neb tx in office , w/ improvement .  CXR today with no acute process. Chronic changes.  Sats rechecked at rest on room air after neb at 95% .  OTC cold meds not helping .  No n/v/d. Appetite is good. No edema or hemoptysis.  No fever .  Of note on ACE inhibitor . >>CXR w/ no acute process, Augmentin x 7 d , steroid taper   04/08/2012 Follow up  Returns for a  3 days follow up for COPD exacerbation  Seen 11/11 for acute SOB/cough/congestion .  O2 sats dropped with walking last ov. Ok at rest  CXR w/ no acute process He was treated with Augmentin/steroid pack He returns feeling much improved with decreased cough and wheezing.  Decreased DOE.  O2 sats 90%.  No n/v/d, orthopnea. Edema. Or fever.      Problem List:  ASTHMATIC BRONCHITIS, ACUTE (ICD-466.0) - occas acute infectious exacerbations... ~  12/10: had bronchitic exac Rx'd w/ Avelox, Pred, Mucinex, etc & improved==> CXR showed COPD/E, incr interstitial markings & scarring, calcif pleural plaques, enlarged heart & pacer, NAD.Marland Kitchen.  ~  7/11:  similar episode, responded to similar meds...  COPD  (ICD-496) - ex-smoker, quit 2002... he clearly does better on regular dosing of ADVAIR 500Bid, MUCINEX 1200mg Bid, XOPENEX Prn... urged to continue these regularly and get more regular exercise... ~  CXR 6/13 showed stable heart size s/p CABG, pacer, hyperinflated/ clear lungs w/ interstitial prominence, DJD in TSpine...  HYPERTENSION (ICD-401.9) - meds for BP & his Chr CHF= COREG 12.5mg Bid,  ENALAPRIL 2.5mg /d, & LASIX 20mg  Qod (he's only taking this Prn swelling)...   ~  labs 1/10 showed K= 5.6, BUN= 31, Creat= 1.9..Marland Kitchen ACE/ ARB stopped... pt didn't follow up. ~  8/10: Coreg + Enalapril restarted by DrHochrein... ~  labs 9/10 showed K=5.6, BUN=19, Creat=1.5 ~  labs 12/10 showed K=4.4, BUN= 32, Creat= 1.7, BNP= 102 ~  labs 4/11 showed K=5.1, BUN=16, Creat=1.4 ~  10/11: BP=122/68 today, and feeling well- he denies HA, fatigue, visual changes, CP, palipit, dizziness, syncope, edema, etc. ~  labs 2/13 showed K=5.7, BUN=27, Creat=1.5 ~  6/13: BP= 112/62 and he denies CP, palpit, dizzy, syncope, ch in SOB, edema... ~  Labs 6/13 showed K=5.5, BUN=21, Creat=1.4  CORONARY ARTERY DISEASE (ICD-414.00) - no chest pains, no palpit, no defib discharges... ~ Hx 3 vessel CAD w/ CABG x4 in 2/03 for LMain dis & 3vessel dis + mod LVD EF=35% at that time... ~ Cath 7/06 w/ non-filling of SVG to PDA, & LIMA to LAD was atretic, w/ EF=45%... ~ Cath 10/08 w/ norm SVG to PDA, & atretic LIMA to LAD (due to good native vessel flow), EF=40%... note is made of a 40-50% LMain lesion... ~  EKG 1/13 showed SBrady, rightward axis, IVCD, NSSTTWA...  CONGESTIVE HEART FAILURE, SYSTOLIC, CHRONIC (ICD-428.0) - ischemic cardiomyopathy & meds as above... followed by DrHochrein in the CHF clinic...  see labs above>> ~  seen 8/10 in CHF clinic- Coreg + Enalapril restarted, labs reviewed. ~  2DEcho 2/13 showed LV mildly dilated w/ diffuse HK, incr wall thickness c/w mild LVH, EF=35-40%, mild MR, mild LAdil... ~  Labs 6/13 showed Creat=1.4  & BNP=83  Hx of VENTRICULAR TACHYCARDIA (ICD-427.1) - prev documented medication non-compliance w/ Amio & Mexilitene... DrHochrein has adjusted his meds... ~ hx VTach storm w/ AICD placed, followed by DrTaylor/Klein on meds... ~ DC'd 10/08 on Amiodarone 200mg - 2 tabs twice daily, and MEXILETINE 150mg Bid... subseq decr Amio 200mg Bid... ~ f/u w/ DrKlein 9/09 w/ defib check- doing well... ~  f/u 9/10 w/ DrKlein on Mexilitene alone, Amio stopped for intolerance... doing well- no changes made. ~  f/u 1/13 w/ DrAllred> stable on Mexilitene, & no changes made...  PERIPHERAL VASCULAR DISEASE (ICD-443.9) ~ s/p PTA to right SFA for an ABI of 0.36...  ~  CDopplers 10/09 showed bilat carotid disease w/ bruits and mod plaque in bulbs, 0-39% bilat... no change. ~  CDopplers 11/11 showed mod heterogeneous  plaque in bulbs, 0-39% bilat ICA stenoses, f/u 86yrs...  HYPERCHOLESTEROLEMIA (ICD-272.0) - prev on Simvastatin 40mg /d, and changed to PRAVASTATIN 80mg /d per DrHochrein (due to interaction of Simva & Amio)... FENOFIBRATE 160mg /d added 8/09... ~  FLP 8/08 on Simva40 showed TChol 136, TG 297, HDL 22, LDL 69- but he was ?not taking meds regularly? he freq forgets. ~  FLP 8/09 on Prav80 showed TChol 124, TG 257, HDL 24, LDL 65... rec to add Fenofibrate 160/d... ~  FLP 9/10 on Prav80+Feno160 showed TChol 137, TG 142, HDL 26, LDL 83... continue both meds. ~  FLP 4/11 on Prav80+Feno160 showed TChol 131, TG 139, HDL 33, LDL 70 ~  FLP 10/11 = pt didn't go to the lab for blood work... ~  FLP 2/13 on Prav80+Feno160 showed TChol 136, TG 105, HDL 31, LDL 84  DIABETES MELLITUS (ICD-250.00) - on GLUCOVANCE 2.5/500- 1/2 Bid... he states his home BS checks have all been around 100 w/ occas nocturnal hypoglycemia> advised to take PM dose at dinner, not bedtime. ~   prev BS=177, HgA1c=7.0 in Oct08... ~  labs 2/09 showed BS=83, HgA1c=6.8 ~  labs 8/09 showed BS= 118, HgA1c= 7.3 ~  labs 9/10 showed BS= 90, A1c= 7.2 ~  labs  4/11 showed BS= 65, A1c= 6.9 ~  labs 2/13 showed BS= 76-171 ~  Labs 6/13 showed BS= 67, A1c=7.0  DIVERTICULOSIS OF COLON (ICD-562.10) - last colonoscopy was 10/02 by [facility] showing divertics only... prev polypectomy 1997 for tubular adenoma... overdue for f/u exam... COLONIC POLYPS (ICD-211.3)  RENAL INSUFFICIENCY (ICD-588.9) - Creat in the 1.6-1.7 range... ~  labs 8/09 showed BUN= 14, Creat= 1.2 ~  labs 1/10 showed BUN= 31, Creat= 1.9 & ACE was held... f/u improved. ~  labs 9/10 showed BUN= 19, Creat= 1.5 ~  labs 4/11 showed BUN= 16, Creat= 1.4 ~  Labs 2/13 showed BUN= 27, Creat= 1.5 ~  Labs 6/13 showed BUN= 21, Creat= 1.4  DEGENERATIVE JOINT DISEASE (ICD-715.90) ~  2/12:  Seen by DrWainer w/ bilat shoulder pain; XRays showed glenohumeral degen changes; given bilat injections w/ relief...  ANXIETY (ICD-300.00)  SEBORRHEIC DERMATITIS (ICD-690.10) - he has mod seb derm- discussed Rx w/ Selsun/ T-Gel/ Lotrisone Cream Prn...   Past Surgical History  Procedure Date  . Coronary artery bypass graft   . Nsertion of a single chamber defibrillator- medtronic maxima 8/06 drklein     Outpatient Encounter Prescriptions as of 04/08/2012  Medication Sig Dispense Refill  . ADVAIR DISKUS 500-50 MCG/DOSE AEPB INHALE ONE DOSE BY MOUTH TWICE DAILY  60 each  6  . amoxicillin-clavulanate (AUGMENTIN) 875-125 MG per tablet Take 1 tablet by mouth 2 (two) times daily.  14 tablet  0  . aspirin 325 MG EC tablet Take 325 mg by mouth daily.        . carvedilol (COREG) 12.5 MG tablet Take 1 tablet (12.5 mg total) by mouth 2 (two) times daily with a meal.  180 tablet  3  . enalapril (VASOTEC) 5 MG tablet Take 1 tablet (5 mg total) by mouth 2 (two) times daily.  180 tablet  3  . fenofibrate 160 MG tablet Take 1 tablet (160 mg total) by mouth daily.  90 tablet  3  . furosemide (LASIX) 20 MG tablet Take 1 tablet (20 mg total) by mouth as needed.  90 tablet  1  . glyBURIDE-metformin (GLUCOVANCE) 2.5-500 MG per  tablet TAKE ONE-HALF TABLET BY MOUTH TWICE DAILY WITH BREAKFAST AND DINNER  30 tablet  6  .  guaiFENesin (MUCINEX) 600 MG 12 hr tablet Take 1,200 mg by mouth 2 (two) times daily.        Marland Kitchen levalbuterol (XOPENEX HFA) 45 MCG/ACT inhaler Inhale 1-2 puffs into the lungs every 4 (four) hours as needed.        . mexiletine (MEXITIL) 150 MG capsule TAKE ONE CAPSULE BY MOUTH TWICE DAILY  60 capsule  6  . pravastatin (PRAVACHOL) 80 MG tablet Take 1 tablet (80 mg total) by mouth daily.  90 tablet  3  . predniSONE (DELTASONE) 10 MG tablet 4 tabs for 2 days, then 3 tabs for 2 days, 2 tabs for 2 days, then 1 tab for 2 days, then stop  20 tablet  0    Allergies  Allergen Reactions  . Niacin     REACTION: INTOL to Niaspan per pt    Current Medications, Allergies, Past Medical History, Past Surgical History, Family History, and Social History were reviewed in [employer] record.   Review of Systems Constitutional:   No  weight loss, night sweats,  Fevers, chills, fatigue, or  lassitude.  HEENT:   No headaches,  Difficulty swallowing,  Tooth/dental problems, or  Sore throat,                No sneezing, itching, ear ache,  +nasal congestion, post nasal drip,   CV:  No chest pain,  Orthopnea, PND, swelling in lower extremities, anasarca, dizziness, palpitations, syncope.   GI  No heartburn, indigestion, abdominal pain, nausea, vomiting, diarrhea, change in bowel habits, loss of appetite, bloody stools.   Resp:   No coughing up of blood.     No chest wall deformity  Skin: no rash or lesions.  GU: no dysuria, change in color of urine, no urgency or frequency.  No flank pain, no hematuria   MS:  No joint pain or swelling.  No decreased range of motion.  No back pain.  Psych:  No change in mood or affect. No depression or anxiety.  No memory loss.                 Objective:   Physical Exam     WD, WN, 76 y/o WM in NAD... GENERAL:  Alert & oriented; pleasant &  cooperative... HEENT:  [place]/AT,   EACs-clear, TMs-wnl, NOSE-clear, THROAT-clear & wnl. NECK:  Supple w/ fairROM; no JVD; normal carotid impulses w/o bruits; no thyromegaly or nodules palpated; no lymphadenopathy. CHEST:  Decreased BS in bases, no wheezing  HEART:  Regular Rhythm;  gr1/6 SEM, without rubs or gallops heard... ABDOMEN:  Soft & nontender; normal bowel sounds; no organomegaly or masses detected. EXT: without deformities, mild arthritic changes; no varicose veins/ venous insuffic/ or edema. NEURO:   gait normal & balance OK. DERM:  No lesions noted; no rash etc...   CXR 04/05/2012  Enlargement of cardiac silhouette post CABG and AICD.  Changes of COPD with stable calcified pleural plaque disease       Assessment:

## 2012-04-08 NOTE — Patient Instructions (Addendum)
Finish Augmentin and Prednisone  Mucinex DM Twice daily  As needed  For cough/congestion  Fluids and rest  follow up in 3 weeks with  Dr. Kriste Basque  As planned  Please contact office for sooner follow up if symptoms do not improve or worsen or seek emergency care

## 2012-04-19 ENCOUNTER — Encounter: Payer: Self-pay | Admitting: Internal Medicine

## 2012-04-19 ENCOUNTER — Ambulatory Visit (INDEPENDENT_AMBULATORY_CARE_PROVIDER_SITE_OTHER): Payer: Medicare Other | Admitting: *Deleted

## 2012-04-19 DIAGNOSIS — I472 Ventricular tachycardia: Secondary | ICD-10-CM

## 2012-04-19 DIAGNOSIS — Z95810 Presence of automatic (implantable) cardiac defibrillator: Secondary | ICD-10-CM

## 2012-04-21 LAB — REMOTE ICD DEVICE
DEV-0020ICD: NEGATIVE
RV LEAD IMPEDENCE ICD: 608 Ohm
TZAT-0001SLOWVT: 1
TZAT-0001SLOWVT: 2
TZAT-0004FASTVT: 8
TZAT-0004SLOWVT: 6
TZAT-0005SLOWVT: 78 pct
TZAT-0005SLOWVT: 88 pct
TZAT-0011SLOWVT: 10 ms
TZAT-0011SLOWVT: 10 ms
TZAT-0012FASTVT: 200 ms
TZAT-0020FASTVT: 1.6 ms
TZON-0003FASTVT: 240 ms
TZON-0011AFLUTTER: 70
TZST-0001FASTVT: 4
TZST-0001SLOWVT: 3
TZST-0001SLOWVT: 4
TZST-0003FASTVT: 25 J
TZST-0003FASTVT: 35 J
TZST-0003FASTVT: 35 J
TZST-0003FASTVT: 35 J
TZST-0003SLOWVT: 15 J
TZST-0003SLOWVT: 35 J
VENTRICULAR PACING ICD: 0 pct

## 2012-05-03 ENCOUNTER — Encounter: Payer: Self-pay | Admitting: *Deleted

## 2012-05-04 ENCOUNTER — Encounter: Payer: Self-pay | Admitting: Pulmonary Disease

## 2012-05-04 ENCOUNTER — Other Ambulatory Visit (INDEPENDENT_AMBULATORY_CARE_PROVIDER_SITE_OTHER): Payer: Medicare Other

## 2012-05-04 ENCOUNTER — Ambulatory Visit (INDEPENDENT_AMBULATORY_CARE_PROVIDER_SITE_OTHER): Payer: Medicare Other | Admitting: Pulmonary Disease

## 2012-05-04 VITALS — BP 100/58 | HR 67 | Temp 96.9°F | Ht 67.0 in | Wt 170.0 lb

## 2012-05-04 DIAGNOSIS — J449 Chronic obstructive pulmonary disease, unspecified: Secondary | ICD-10-CM

## 2012-05-04 DIAGNOSIS — I6529 Occlusion and stenosis of unspecified carotid artery: Secondary | ICD-10-CM

## 2012-05-04 DIAGNOSIS — I5022 Chronic systolic (congestive) heart failure: Secondary | ICD-10-CM

## 2012-05-04 DIAGNOSIS — E119 Type 2 diabetes mellitus without complications: Secondary | ICD-10-CM

## 2012-05-04 DIAGNOSIS — F411 Generalized anxiety disorder: Secondary | ICD-10-CM

## 2012-05-04 DIAGNOSIS — N259 Disorder resulting from impaired renal tubular function, unspecified: Secondary | ICD-10-CM

## 2012-05-04 DIAGNOSIS — R0609 Other forms of dyspnea: Secondary | ICD-10-CM

## 2012-05-04 DIAGNOSIS — I2510 Atherosclerotic heart disease of native coronary artery without angina pectoris: Secondary | ICD-10-CM

## 2012-05-04 DIAGNOSIS — M545 Low back pain, unspecified: Secondary | ICD-10-CM | POA: Insufficient documentation

## 2012-05-04 DIAGNOSIS — I739 Peripheral vascular disease, unspecified: Secondary | ICD-10-CM

## 2012-05-04 DIAGNOSIS — E7800 Pure hypercholesterolemia, unspecified: Secondary | ICD-10-CM

## 2012-05-04 DIAGNOSIS — R0600 Dyspnea, unspecified: Secondary | ICD-10-CM

## 2012-05-04 DIAGNOSIS — M1990 Unspecified osteoarthritis, unspecified site: Secondary | ICD-10-CM

## 2012-05-04 DIAGNOSIS — I472 Ventricular tachycardia: Secondary | ICD-10-CM

## 2012-05-04 DIAGNOSIS — Z95810 Presence of automatic (implantable) cardiac defibrillator: Secondary | ICD-10-CM

## 2012-05-04 DIAGNOSIS — I10 Essential (primary) hypertension: Secondary | ICD-10-CM

## 2012-05-04 LAB — BASIC METABOLIC PANEL
CO2: 34 mEq/L — ABNORMAL HIGH (ref 19–32)
Calcium: 9.1 mg/dL (ref 8.4–10.5)
GFR: 38.33 mL/min — ABNORMAL LOW (ref >60.00)
Potassium: 5.2 mEq/L — ABNORMAL HIGH (ref 3.5–5.1)
Sodium: 136 mEq/L (ref 135–145)

## 2012-05-04 LAB — HEMOGLOBIN A1C: Hgb A1c MFr Bld: 7.5 % — ABNORMAL HIGH (ref 4.6–6.5)

## 2012-05-04 MED ORDER — TRAMADOL HCL 50 MG PO TABS: 50.0000 mg | ORAL_TABLET | Freq: Three times a day (TID) | ORAL | Status: DC | PRN

## 2012-05-04 MED ORDER — METHOCARBAMOL 500 MG PO TABS: 500.0000 mg | ORAL_TABLET | Freq: Three times a day (TID) | ORAL | Status: DC | PRN

## 2012-05-04 NOTE — Patient Instructions (Addendum)
Today we updated your med list in our EPIC system...    Continue your current medications the same...  Today we did your follow up targeted labs...    We will contact you w/ the results when avail...  We wrote new prescriptions for TRAMADOL- 50mg , one tab up to 3 times daily as needed for pain (take w/ Tylenol to potentiate it's effect)...    ROBAXIN- 500mg , one tab up to 3 times daily as needed for muscle spasm...  Rest the back, apply heat, deep heating cream, etc...  We will arrange for an Orthopedic evaluation at [facility]...  Continue your MUCINEX, Fluids, etc...  Call for any problems...   Let's continue our 85mo check ups.Marland KitchenMarland Kitchen

## 2012-05-17 ENCOUNTER — Other Ambulatory Visit: Payer: Self-pay | Admitting: Pulmonary Disease

## 2012-05-17 MED ORDER — GLYBURIDE-METFORMIN 2.5-500 MG PO TABS: ORAL_TABLET | ORAL | Status: DC

## 2012-05-22 ENCOUNTER — Telehealth: Payer: Self-pay | Admitting: Physician Assistant

## 2012-05-22 NOTE — Telephone Encounter (Signed)
Patient with Medtronic ICD implanted in 2006 with [phone] lead. He heard series of 4 beeps last night. He has no complaints.  No chest pain, dyspnea, syncope, ICD shocks. Will have him brought in Monday 12/30 for interrogation.  Suspect his battery is at [facility]. Tereso Newcomer, PA-C  11:41 AM 05/22/2012

## 2012-05-24 ENCOUNTER — Encounter: Payer: Self-pay | Admitting: Internal Medicine

## 2012-05-24 ENCOUNTER — Ambulatory Visit (INDEPENDENT_AMBULATORY_CARE_PROVIDER_SITE_OTHER): Payer: Medicare Other | Admitting: *Deleted

## 2012-05-24 ENCOUNTER — Inpatient Hospital Stay (HOSPITAL_COMMUNITY)
Admission: AD | Admit: 2012-05-24 | Discharge: 2012-05-26 | DRG: 227 | Disposition: A | Discharge status: Home / Self Care | Payer: Medicare Other | Source: Ambulatory Visit | Attending: Internal Medicine | Admitting: Internal Medicine

## 2012-05-24 ENCOUNTER — Other Ambulatory Visit: Payer: Self-pay | Admitting: *Deleted

## 2012-05-24 ENCOUNTER — Encounter (HOSPITAL_COMMUNITY): Payer: Self-pay | Admitting: General Practice

## 2012-05-24 DIAGNOSIS — I5022 Chronic systolic (congestive) heart failure: Secondary | ICD-10-CM | POA: Diagnosis present

## 2012-05-24 DIAGNOSIS — I509 Heart failure, unspecified: Secondary | ICD-10-CM | POA: Diagnosis present

## 2012-05-24 DIAGNOSIS — Y831 Surgical operation with implant of artificial internal device as the cause of abnormal reaction of the patient, or of later complication, without mention of misadventure at the time of the procedure: Secondary | ICD-10-CM | POA: Diagnosis present

## 2012-05-24 DIAGNOSIS — T82198A Other mechanical complication of other cardiac electronic device, initial encounter: Secondary | ICD-10-CM

## 2012-05-24 DIAGNOSIS — I252 Old myocardial infarction: Secondary | ICD-10-CM

## 2012-05-24 DIAGNOSIS — J4489 Other specified chronic obstructive pulmonary disease: Secondary | ICD-10-CM | POA: Diagnosis present

## 2012-05-24 DIAGNOSIS — Y92009 Unspecified place in unspecified non-institutional (private) residence as the place of occurrence of the external cause: Secondary | ICD-10-CM

## 2012-05-24 DIAGNOSIS — M19049 Primary osteoarthritis, unspecified hand: Secondary | ICD-10-CM | POA: Diagnosis present

## 2012-05-24 DIAGNOSIS — I472 Ventricular tachycardia, unspecified: Secondary | ICD-10-CM

## 2012-05-24 DIAGNOSIS — Z95810 Presence of automatic (implantable) cardiac defibrillator: Secondary | ICD-10-CM

## 2012-05-24 DIAGNOSIS — Z7982 Long term (current) use of aspirin: Secondary | ICD-10-CM

## 2012-05-24 DIAGNOSIS — J449 Chronic obstructive pulmonary disease, unspecified: Secondary | ICD-10-CM | POA: Diagnosis present

## 2012-05-24 DIAGNOSIS — Z87891 Personal history of nicotine dependence: Secondary | ICD-10-CM

## 2012-05-24 DIAGNOSIS — Z951 Presence of aortocoronary bypass graft: Secondary | ICD-10-CM

## 2012-05-24 DIAGNOSIS — I2589 Other forms of chronic ischemic heart disease: Secondary | ICD-10-CM | POA: Diagnosis present

## 2012-05-24 DIAGNOSIS — I4729 Other ventricular tachycardia: Secondary | ICD-10-CM | POA: Diagnosis present

## 2012-05-24 DIAGNOSIS — E7800 Pure hypercholesterolemia, unspecified: Secondary | ICD-10-CM | POA: Diagnosis present

## 2012-05-24 DIAGNOSIS — I10 Essential (primary) hypertension: Secondary | ICD-10-CM | POA: Diagnosis present

## 2012-05-24 DIAGNOSIS — Z23 Encounter for immunization: Secondary | ICD-10-CM

## 2012-05-24 DIAGNOSIS — Z79899 Other long term (current) drug therapy: Secondary | ICD-10-CM

## 2012-05-24 DIAGNOSIS — F411 Generalized anxiety disorder: Secondary | ICD-10-CM | POA: Diagnosis present

## 2012-05-24 DIAGNOSIS — I2510 Atherosclerotic heart disease of native coronary artery without angina pectoris: Secondary | ICD-10-CM | POA: Diagnosis present

## 2012-05-24 DIAGNOSIS — E119 Type 2 diabetes mellitus without complications: Secondary | ICD-10-CM | POA: Diagnosis present

## 2012-05-24 DIAGNOSIS — F1011 Alcohol abuse, in remission: Secondary | ICD-10-CM | POA: Diagnosis present

## 2012-05-24 HISTORY — DX: Other chronic pain: G89.29

## 2012-05-24 HISTORY — DX: Unspecified osteoarthritis, unspecified site: M19.90

## 2012-05-24 HISTORY — DX: Primary osteoarthritis, unspecified hand: M19.049

## 2012-05-24 HISTORY — DX: Type 2 diabetes mellitus without complications: E11.9

## 2012-05-24 HISTORY — DX: Low back pain: M54.5

## 2012-05-24 HISTORY — DX: Low back pain, unspecified: M54.50

## 2012-05-24 LAB — ICD DEVICE OBSERVATION
BATTERY VOLTAGE: 2.71 V
CHARGE TIME: 10.02 s
FVT: 0
RV LEAD AMPLITUDE: 6.6 mv
TZAT-0001SLOWVT: 1
TZAT-0001SLOWVT: 2
TZAT-0004FASTVT: 8
TZAT-0004SLOWVT: 6
TZAT-0004SLOWVT: 8
TZAT-0011FASTVT: 10 ms
TZAT-0012FASTVT: 200 ms
TZAT-0013FASTVT: 2
TZAT-0013SLOWVT: 4
TZAT-0013SLOWVT: 4
TZAT-0018FASTVT: NEGATIVE
TZAT-0019FASTVT: 8 V
TZAT-0019SLOWVT: 8 V
TZAT-0019SLOWVT: 8 V
TZAT-0020FASTVT: 1.6 ms
TZAT-0020SLOWVT: 1.6 ms
TZAT-0020SLOWVT: 1.6 ms
TZON-0003FASTVT: 240 ms
TZON-0008SLOWVT: 0 ms
TZST-0001FASTVT: 2
TZST-0001FASTVT: 4
TZST-0001FASTVT: 6
TZST-0001SLOWVT: 3
TZST-0001SLOWVT: 5
TZST-0003FASTVT: 35 J
TZST-0003FASTVT: 35 J
TZST-0003SLOWVT: 35 J
TZST-0003SLOWVT: 35 J
VENTRICULAR PACING ICD: 0 pct
VF: 0

## 2012-05-24 LAB — CBC
HCT: 41.6 % (ref 39.0–52.0)
Hemoglobin: 13.6 g/dL (ref 13.0–17.0)
MCHC: 32.7 g/dL (ref 30.0–36.0)
MCV: 88.9 fL (ref 78.0–100.0)
RDW: 14.6 % (ref 11.5–15.5)

## 2012-05-24 LAB — BASIC METABOLIC PANEL
BUN: 24 mg/dL — ABNORMAL HIGH (ref 6–23)
CO2: 31 mEq/L (ref 19–32)
Chloride: 100 mEq/L (ref 96–112)
Creatinine, Ser: 1.48 mg/dL — ABNORMAL HIGH (ref 0.50–1.35)
GFR calc Af Amer: 52 mL/min — ABNORMAL LOW (ref >90)
Glucose, Bld: 157 mg/dL — ABNORMAL HIGH (ref 70–99)
Potassium: 5.8 mEq/L — ABNORMAL HIGH (ref 3.5–5.1)

## 2012-05-24 LAB — PROTIME-INR: INR: 1.06 (ref 0.00–1.49)

## 2012-05-24 LAB — GLUCOSE, CAPILLARY

## 2012-05-24 MED ORDER — CARVEDILOL 12.5 MG PO TABS
12.5000 mg | ORAL_TABLET | Freq: Two times a day (BID) | ORAL | Status: DC
  Administered 2012-05-24 – 2012-05-26 (×4): 12.5 mg via ORAL
  Filled 2012-05-24 (×6): qty 1

## 2012-05-24 MED ORDER — ENALAPRIL MALEATE 5 MG PO TABS
5.0000 mg | ORAL_TABLET | Freq: Two times a day (BID) | ORAL | Status: DC
  Administered 2012-05-24 – 2012-05-25 (×3): 5 mg via ORAL
  Filled 2012-05-24 (×5): qty 1

## 2012-05-24 MED ORDER — INFLUENZA VIRUS VACC SPLIT PF IM SUSP
0.5000 mL | INTRAMUSCULAR | Status: DC
  Filled 2012-05-24: qty 0.5

## 2012-05-24 MED ORDER — CHLORHEXIDINE GLUCONATE 4 % EX LIQD
60.0000 mL | Freq: Once | CUTANEOUS | Status: AC
  Administered 2012-05-25: 4 via TOPICAL
  Filled 2012-05-24: qty 60

## 2012-05-24 MED ORDER — METHOCARBAMOL 500 MG PO TABS
500.0000 mg | ORAL_TABLET | Freq: Three times a day (TID) | ORAL | Status: DC | PRN
  Filled 2012-05-24: qty 1

## 2012-05-24 MED ORDER — MEXILETINE HCL 150 MG PO CAPS
150.0000 mg | ORAL_CAPSULE | Freq: Two times a day (BID) | ORAL | Status: DC
  Administered 2012-05-24 – 2012-05-25 (×2): 150 mg via ORAL
  Filled 2012-05-24 (×5): qty 1

## 2012-05-24 MED ORDER — CHLORHEXIDINE GLUCONATE 4 % EX LIQD
60.0000 mL | Freq: Once | CUTANEOUS | Status: AC
  Administered 2012-05-24: 4 via TOPICAL
  Filled 2012-05-24 (×2): qty 60

## 2012-05-24 MED ORDER — ASPIRIN 325 MG PO TABS
325.0000 mg | ORAL_TABLET | Freq: Every day | ORAL | Status: DC
  Administered 2012-05-25: 325 mg via ORAL
  Filled 2012-05-24 (×2): qty 1

## 2012-05-24 MED ORDER — SIMVASTATIN 20 MG PO TABS
20.0000 mg | ORAL_TABLET | Freq: Every day | ORAL | Status: DC
  Administered 2012-05-25: 20 mg via ORAL
  Filled 2012-05-24 (×2): qty 1

## 2012-05-24 MED ORDER — TRAMADOL HCL 50 MG PO TABS: 50.0000 mg | ORAL_TABLET | Freq: Three times a day (TID) | ORAL | Status: DC | PRN

## 2012-05-24 MED ORDER — LEVALBUTEROL TARTRATE 45 MCG/ACT IN AERO
1.0000 | INHALATION_SPRAY | RESPIRATORY_TRACT | Status: DC | PRN
  Filled 2012-05-24: qty 15

## 2012-05-24 MED ORDER — GUAIFENESIN ER 600 MG PO TB12
1200.0000 mg | ORAL_TABLET | Freq: Two times a day (BID) | ORAL | Status: DC
  Administered 2012-05-24 – 2012-05-25 (×3): 1200 mg via ORAL
  Filled 2012-05-24 (×5): qty 2

## 2012-05-24 MED ORDER — INSULIN ASPART 100 UNIT/ML ~~LOC~~ SOLN
0.0000 [IU] | Freq: Three times a day (TID) | SUBCUTANEOUS | Status: DC
  Administered 2012-05-25: 5 [IU] via SUBCUTANEOUS

## 2012-05-24 MED ORDER — SODIUM POLYSTYRENE SULFONATE 15 GM/60ML PO SUSP
30.0000 g | Freq: Once | ORAL | Status: AC
  Administered 2012-05-25: 30 g via ORAL
  Filled 2012-05-24: qty 120

## 2012-05-24 MED ORDER — FENOFIBRATE 160 MG PO TABS
160.0000 mg | ORAL_TABLET | Freq: Every day | ORAL | Status: DC
  Administered 2012-05-25: 160 mg via ORAL
  Filled 2012-05-24 (×2): qty 1

## 2012-05-24 MED ORDER — CEFAZOLIN SODIUM-DEXTROSE 2-3 GM-% IV SOLR
2.0000 g | INTRAVENOUS | Status: AC
  Administered 2012-05-25: 2 g via INTRAVENOUS
  Filled 2012-05-24 (×2): qty 50

## 2012-05-24 MED ORDER — SODIUM CHLORIDE 0.9 % IR SOLN
80.0000 mg | Status: AC
  Administered 2012-05-25: 80 mg
  Filled 2012-05-24 (×2): qty 2

## 2012-05-24 MED ORDER — MOMETASONE FURO-FORMOTEROL FUM 200-5 MCG/ACT IN AERO
2.0000 | INHALATION_SPRAY | Freq: Two times a day (BID) | RESPIRATORY_TRACT | Status: DC
  Administered 2012-05-24 – 2012-05-25 (×2): 2 via RESPIRATORY_TRACT
  Filled 2012-05-24: qty 8.8

## 2012-05-24 NOTE — H&P (Signed)
ADMISSION HISTORY AND PHYSICAL   Date: 05/24/2012               Patient Name:  [name] MRN: [id number]  DOB: 1935/05/28 Age / Sex: 76 y.o., male        PCP: NADEL,SCOTT M Primary Cardiologist: Zannie Kehr         History of Present Illness: Patient is a 76 y.o. male with a PMHx of CAD, hypertension, and carotid artery disease. Also has a history of chronic systolic congestive heart failure ( EF 35-40%).  He had an ICD placed for primary prevention.  He noted that his ICD started beeping approximately 2-3 days ago. He was seen in ICD clinic today and was found to have a fracture of his ventricular lead. He  was admitted to [facility] on 05/24/2012 for removal of this ICD lead and possible complete generator change.  He denies any chest pain or shortness breath. He denies any syncope or presyncope. He's not had any ICD discharges.  Medications: Outpatient medications: Prescriptions prior to admission  Medication Sig Dispense Refill  . ADVAIR DISKUS 500-50 MCG/DOSE AEPB INHALE ONE DOSE BY MOUTH TWICE DAILY  60 each  6  . aspirin 325 MG EC tablet Take 325 mg by mouth daily.        . carvedilol (COREG) 12.5 MG tablet Take 1 tablet (12.5 mg total) by mouth 2 (two) times daily with a meal.  180 tablet  3  . enalapril (VASOTEC) 5 MG tablet Take 1 tablet (5 mg total) by mouth 2 (two) times daily.  180 tablet  3  . fenofibrate 160 MG tablet Take 1 tablet (160 mg total) by mouth daily.  90 tablet  3  . furosemide (LASIX) 20 MG tablet Take 1 tablet (20 mg total) by mouth as needed.  90 tablet  1  . glyBURIDE-metformin (GLUCOVANCE) 2.5-500 MG per tablet Take one tablet in the morning and 1/2 tablet at dinner  135 tablet  3  . guaiFENesin (MUCINEX) 600 MG 12 hr tablet Take 1,200 mg by mouth 2 (two) times daily.        Marland Kitchen levalbuterol (XOPENEX HFA) 45 MCG/ACT inhaler Inhale 1-2 puffs into the lungs every 4 (four) hours as needed.        . methocarbamol (ROBAXIN) 500 MG tablet Take 1 tablet  (500 mg total) by mouth 3 (three) times daily as needed (muscle spasm).  90 tablet  5  . mexiletine (MEXITIL) 150 MG capsule TAKE ONE CAPSULE BY MOUTH TWICE DAILY  60 capsule  6  . pravastatin (PRAVACHOL) 80 MG tablet Take 1 tablet (80 mg total) by mouth daily.  90 tablet  3  . traMADol (ULTRAM) 50 MG tablet Take 1 tablet (50 mg total) by mouth 3 (three) times daily as needed for pain.  90 tablet  6    Allergies  Allergen Reactions  . Niacin     REACTION: INTOL to Niaspan per pt     Past Medical History  Diagnosis Date  . VENTRICULAR TACHYCARDIA   . PERIPHERAL VASCULAR DISEASE   . HYPERTENSION   . HYPERCHOLESTEROLEMIA   . CORONARY ARTERY DISEASE   . Chronic systolic heart failure   . CAROTID ARTERY DISEASE   . SEBORRHEIC DERMATITIS   . RENAL INSUFFICIENCY   . DIVERTICULOSIS OF COLON   . DIABETES MELLITUS   . DEGENERATIVE JOINT DISEASE   . CPK, ABNORMAL   . COPD   . COLONIC POLYPS   .  CANDIDIASIS, ORAL   . AUTOMATIC IMPLANTABLE CARDIAC DEFIBRILLATOR SITU   . ASTHMATIC BRONCHITIS, ACUTE   . ANXIETY     Past Surgical History  Procedure Date  . Coronary artery bypass graft   . Nsertion of a single chamber defibrillator- medtronic maxima 8/06 drklein     Family History  Problem Relation Age of Onset  . Heart attack Father   . Heart attack Brother     Social History:  reports that he quit smoking about 11 years ago. He does not have any smokeless tobacco history on file. His alcohol and drug histories not on file.   Review of Systems: Constitutional:  denies fever, chills, diaphoresis, appetite change and fatigue.  HEENT: denies photophobia, eye pain, redness, hearing loss, ear pain, congestion, sore throat, rhinorrhea, sneezing, neck pain, neck stiffness and tinnitus.  Respiratory: denies SOB, DOE, cough, chest tightness, and wheezing.  Cardiovascular: denies chest pain, palpitations and leg swelling.  Gastrointestinal: denies nausea, vomiting, abdominal pain,  diarrhea, constipation, blood in stool.  Genitourinary: denies dysuria, urgency, frequency, hematuria, flank pain and difficulty urinating.  Musculoskeletal: denies  myalgias, back pain, joint swelling, arthralgias and gait problem.   Skin: denies pallor, rash and wound.  Neurological: denies dizziness, seizures, syncope, weakness, light-headedness, numbness and headaches.   Hematological: denies adenopathy, easy bruising, personal or family bleeding history.  Psychiatric/ Behavioral: denies suicidal ideation, mood changes, confusion, nervousness, sleep disturbance and agitation.    Physical Exam: BP 138/85  Pulse 76  Temp 97.7 F (36.5 C) (Oral)  Resp 17  Ht 5\' 7"  (1.702 m)  Wt 171 lb 8.3 oz (77.8 kg)  BMI 26.86 kg/m2  SpO2 96%  General: Vital signs reviewed and noted. Well-developed, well-nourished, in no acute distress; alert, appropriate and cooperative throughout examination.  Head: Normocephalic, atraumatic, sclera anicteric, mucus membranes are moist  Neck: Supple. Negative for carotid bruits. JVD not elevated.  Lungs:  Few wheezes  Heart: RRR with S1 S2. No murmurs, rubs, or gallops appreciated.  Abdomen:  Soft, non-tender, non-distended with normoactive bowel sounds. No hepatomegaly. No rebound/guarding. No obvious abdominal masses  MSK: Strength and the appear normal for age.  Extremities: No clubbing or cyanosis. No edema.  Neurologic: Alert and oriented X 3. Moves all extremities spontaneously  Psych:  Responds to questions appropriately with a normal affect.    Lab results: Basic Metabolic Panel: No results found for this basename: NA:3,K:3,CL:3,CO2:3,GLUCOSE:3,BUN:3,CREATININE:3,CALCIUM:3,MG:5,PHOS:3 in the last 168 hours  Liver Function Tests: No results found for this basename: AST:3,ALT:3,ALKPHOS:3,BILITOT:3,PROT:3,ALBUMIN:3 in the last 168 hours No results found for this basename: LIPASE:3,AMYLASE:3 in the last 168 hours  CBC: No results found for this  basename: WBC:3,NEUTROABS:3,HGB:3,HCT:3,MCV:5,PLT:3 in the last 168 hours  Cardiac Enzymes: No results found for this basename: CKTOTAL:5,CKMB:5,CKMBINDEX:5,TROPONINI:5 in the last 168 hours  BNP: No components found with this basename: POCBNP:3  CBG: No results found for this basename: GLUCAP:5 in the last 168 hours  Coagulation Studies: No results found for this basename: LABPROT:3,INR:3 in the last 72 hours    ECG: pending   Imaging:  No results found.    Assessment & Plan:  1. ICD malfunction:  ICD interrogation reveals a fracture of his 6949 lead.  He is not admitted to observation to have a lead revision.  His ICD is now ~ 74 years old.  It may be close to ERI.  Dr. Ladona Ridgel to make the decision on that.   2. CAD: He remains stable from a coronary standpoint. He's not had any episodes  of angina.  3. Hypertension:-Stable  4. History of COPD: he has some mild wheezing.  5. VT:  Continue current meds.   DVT PPX - scds     Alvia Grove., MD, [facility] 05/24/2012, 5:32 PM

## 2012-05-24 NOTE — Telephone Encounter (Signed)
F/U  Pt wife f/u regarding defib beeps over the weekend, will attempt to schedule pt into Device schedule today per previous message from [place]. Over the weekend.

## 2012-05-24 NOTE — Progress Notes (Signed)
Pt seen in clinic for follow up of ICD.  States beeping every 4 hours since Saturday.   No complaints of chest pain, shortness of breath, dizziness, palpitations, or shocks.  Device interrogation demonstrates fractured [id number] lead.  33 NSVT episodes since Saturday (ventricular oversensing).  RV impedence with spike to 1056 ohms.  Pt to go to [facility] for admission and replacement of RV lead.  Pt has had appropriate therapy from his device as recently as June (ATP).  Bed assignment on 2000.  Hospital team aware of patient.  Device left programmed with therapies on, pt with magnet over device until arrives at hospital and therapies can be disabled while pt on telemetry.     Gypsy Balsam, RN, BSN 05/24/2012 3:44 PM

## 2012-05-25 ENCOUNTER — Encounter (HOSPITAL_COMMUNITY)
Admission: AD | Disposition: A | Discharge status: Home / Self Care | Payer: Self-pay | Source: Ambulatory Visit | Attending: Internal Medicine

## 2012-05-25 DIAGNOSIS — I2589 Other forms of chronic ischemic heart disease: Secondary | ICD-10-CM

## 2012-05-25 HISTORY — PX: IMPLANTABLE CARDIOVERTER DEFIBRILLATOR REVISION: SHX5470

## 2012-05-25 LAB — BASIC METABOLIC PANEL
CO2: 28 mEq/L (ref 19–32)
Chloride: 102 mEq/L (ref 96–112)
Creatinine, Ser: 1.36 mg/dL — ABNORMAL HIGH (ref 0.50–1.35)
GFR calc Af Amer: 58 mL/min — ABNORMAL LOW (ref >90)
Potassium: 4.7 mEq/L (ref 3.5–5.1)

## 2012-05-25 LAB — CBC
MCH: 29.2 pg (ref 26.0–34.0)
MCHC: 33.1 g/dL (ref 30.0–36.0)
MCV: 88.2 fL (ref 78.0–100.0)
Platelets: 169 10*3/uL (ref 150–400)
RDW: 14.7 % (ref 11.5–15.5)
WBC: 8.2 10*3/uL (ref 4.0–10.5)

## 2012-05-25 LAB — CREATININE, SERUM: GFR calc Af Amer: 64 mL/min — ABNORMAL LOW (ref >90)

## 2012-05-25 LAB — GLUCOSE, CAPILLARY
Glucose-Capillary: 64 mg/dL — ABNORMAL LOW (ref 70–99)
Glucose-Capillary: 131 mg/dL — ABNORMAL HIGH (ref 70–99)

## 2012-05-25 SURGERY — IMPLANTABLE CARDIOVERTER DEFIBRILLATOR REVISION
Anesthesia: LOCAL

## 2012-05-25 MED ORDER — MIDAZOLAM HCL 5 MG/5ML IJ SOLN
INTRAMUSCULAR | Status: AC
  Filled 2012-05-25: qty 5

## 2012-05-25 MED ORDER — HEPARIN SODIUM (PORCINE) 5000 UNIT/ML IJ SOLN
5000.0000 [IU] | Freq: Three times a day (TID) | INTRAMUSCULAR | Status: DC
  Administered 2012-05-25 – 2012-05-26 (×3): 5000 [IU] via SUBCUTANEOUS
  Filled 2012-05-25 (×6): qty 1

## 2012-05-25 MED ORDER — SODIUM CHLORIDE 0.45 % IV SOLN
INTRAVENOUS | Status: DC
  Administered 2012-05-25: 08:00:00 via INTRAVENOUS

## 2012-05-25 MED ORDER — DEXTROSE 50 % IV SOLN
INTRAVENOUS | Status: AC
  Administered 2012-05-25: 25 mL
  Filled 2012-05-25: qty 50

## 2012-05-25 MED ORDER — ONDANSETRON HCL 4 MG/2ML IJ SOLN: 4.0000 mg | Freq: Four times a day (QID) | INTRAMUSCULAR | Status: DC | PRN

## 2012-05-25 MED ORDER — ASPIRIN EC 325 MG PO TBEC
325.0000 mg | DELAYED_RELEASE_TABLET | Freq: Every evening | ORAL | Status: DC
  Filled 2012-05-25 (×2): qty 1

## 2012-05-25 MED ORDER — FUROSEMIDE 20 MG PO TABS
20.0000 mg | ORAL_TABLET | Freq: Every day | ORAL | Status: DC
  Administered 2012-05-25: 20 mg via ORAL
  Filled 2012-05-25 (×2): qty 1

## 2012-05-25 MED ORDER — GLYBURIDE 1.25 MG PO TABS
1.2500 mg | ORAL_TABLET | Freq: Every day | ORAL | Status: DC
  Administered 2012-05-25: 1.25 mg via ORAL
  Filled 2012-05-25 (×2): qty 1

## 2012-05-25 MED ORDER — HEPARIN (PORCINE) IN NACL 2-0.9 UNIT/ML-% IJ SOLN
INTRAMUSCULAR | Status: AC
  Filled 2012-05-25: qty 500

## 2012-05-25 MED ORDER — INFLUENZA VIRUS VACC SPLIT PF IM SUSP
0.5000 mL | INTRAMUSCULAR | Status: AC
  Administered 2012-05-26: 0.5 mL via INTRAMUSCULAR
  Filled 2012-05-25: qty 0.5

## 2012-05-25 MED ORDER — GLYBURIDE 2.5 MG PO TABS
2.5000 mg | ORAL_TABLET | Freq: Every day | ORAL | Status: DC
  Administered 2012-05-26: 2.5 mg via ORAL
  Filled 2012-05-25 (×2): qty 1

## 2012-05-25 MED ORDER — ACETAMINOPHEN 325 MG PO TABS: 325.0000 mg | ORAL_TABLET | ORAL | Status: DC | PRN

## 2012-05-25 MED ORDER — GLYBURIDE-METFORMIN 2.5-500 MG PO TABS: 0.5000 | ORAL_TABLET | Freq: Two times a day (BID) | ORAL | Status: DC

## 2012-05-25 MED ORDER — LIDOCAINE HCL (PF) 1 % IJ SOLN
INTRAMUSCULAR | Status: AC
  Filled 2012-05-25: qty 60

## 2012-05-25 MED ORDER — MOMETASONE FURO-FORMOTEROL FUM 200-5 MCG/ACT IN AERO
2.0000 | INHALATION_SPRAY | Freq: Two times a day (BID) | RESPIRATORY_TRACT | Status: DC
  Administered 2012-05-25 – 2012-05-26 (×2): 2 via RESPIRATORY_TRACT
  Filled 2012-05-25: qty 8.8

## 2012-05-25 MED ORDER — CEFAZOLIN SODIUM-DEXTROSE 2-3 GM-% IV SOLR
2.0000 g | Freq: Four times a day (QID) | INTRAVENOUS | Status: AC
  Administered 2012-05-25 – 2012-05-26 (×3): 2 g via INTRAVENOUS
  Filled 2012-05-25 (×3): qty 50

## 2012-05-25 MED ORDER — MEXILETINE HCL 150 MG PO CAPS
150.0000 mg | ORAL_CAPSULE | Freq: Two times a day (BID) | ORAL | Status: DC
  Administered 2012-05-25: 150 mg via ORAL
  Filled 2012-05-25 (×3): qty 1

## 2012-05-25 MED ORDER — METFORMIN HCL 500 MG PO TABS
250.0000 mg | ORAL_TABLET | Freq: Every day | ORAL | Status: DC
  Administered 2012-05-25: 250 mg via ORAL
  Filled 2012-05-25 (×2): qty 1

## 2012-05-25 MED ORDER — FENTANYL CITRATE 0.05 MG/ML IJ SOLN
INTRAMUSCULAR | Status: AC
  Filled 2012-05-25: qty 2

## 2012-05-25 MED ORDER — METFORMIN HCL 500 MG PO TABS
500.0000 mg | ORAL_TABLET | Freq: Every day | ORAL | Status: DC
  Filled 2012-05-25 (×2): qty 1

## 2012-05-25 NOTE — Op Note (Signed)
ICD lead insertion and removal of an old ICD generator and insertion of a new ICD generator with pocket revision and DFT testing without immediate complication. [id number].

## 2012-05-25 NOTE — Progress Notes (Signed)
Hypoglycemic Event  CBG: 64  Treatment: D50 IV 25 mL  Symptoms: None  Follow-up CBG: [id number] CBG Result:131  Possible Reasons for Event: Inadequate meal intake  Comments/MD notified:pt CBG increased with intervention     Anureet Bruington, Lenny Pastel  Remember to initiate Hypoglycemia Order Set & complete

## 2012-05-25 NOTE — Progress Notes (Signed)
Notified MD pt's K was 5.8. Kayexhalate ordered. VSS, pt asymptomatic with no changes on the monitor and resting. Will give medication and continue to monitor closely.

## 2012-05-25 NOTE — Progress Notes (Signed)
Utilization Review Completed.Danijela Vessey T12/31/2013   

## 2012-05-25 NOTE — Consult Note (Signed)
ELECTROPHYSIOLOGY CONSULT NOTE    Patient ID: [name] MRN: [id number], DOB/AGE: August 15, 1935 76 y.o.  Admit date: 05/24/2012 Date of Consult: 05-25-2012  Primary Physician: [name] Mcalpine, MD Primary Cardiologist: [name] Rotunda, MD Electrophysiologist: [name] Manges, MD  Reason for Consultation: Fractured RV ICD lead  HPI:  [name] is a 76 year old male who is s/p ICD implantation in 2006 with single chamber Medtronic ICD [phone] lead).  He has subsequently had appropriate therapies for VT the most recent in June of this year and was treated with ATP.  His ventricular arrhythmias have been controlled with Mexilitine.  He heard his device start beeping on Saturday and called the answering service and was advised to be evaluated in the office Monday.  Yesterday in the office, he was found to have a fractured 6949 lead.  RV impedence increased from 500ohms to >1000ohms.  SIC counter 58, 33 NSVT episodes since Saturday which are ventricular oversensing.  He was admitted to [facility] for further evaluation.  Past medical history is also significant for CAD, hypertension, carotid artery disease, diabetes, and renal insufficiency.  He denies recent problems with chest pain or shortness of breath.    Recent lab work by [name] showed worsening creatinine (1.8).  His Lasix was changed to prn at that time.  Repeat creatinine this admission is 1.4.  Potassium was elevated at 5.8 and he was given Kayexalate last night. Repeat BMET this morning shows K at 4.7  ROS is negative except as outlined above  Past Medical History  Diagnosis Date  . VENTRICULAR TACHYCARDIA   . PERIPHERAL VASCULAR DISEASE   . HYPERTENSION   . HYPERCHOLESTEROLEMIA   . CORONARY ARTERY DISEASE   . Chronic systolic heart failure   . CAROTID ARTERY DISEASE   . SEBORRHEIC DERMATITIS   . RENAL INSUFFICIENCY   . DIVERTICULOSIS OF COLON   . CPK, ABNORMAL   . COPD   . COLONIC POLYPS   . CANDIDIASIS, ORAL   .  ASTHMATIC BRONCHITIS, ACUTE   . ANXIETY   . ICD (implantable cardiac defibrillator) in place   . Myocardial infarction ?1990's    "silent" (05/24/2012)  . Chronic bronchitis     "every year" (05/24/2012)  . Type II diabetes mellitus   . Chronic lower back pain   . DJD (degenerative joint disease)     "hands" (05/24/2012)  . Osteoarthritis of finger      Surgical History:  Past Surgical History  Procedure Date  . Coronary artery bypass graft 1990's    CABG X4  . Tonsillectomy ?1942  . Cardiac defibrillator placement 12/2004    single chamber defibrillator- Medtronic [name] 8/06 [name] [Other][     Prescriptions prior to admission  Medication Sig Dispense Refill  . aspirin EC 325 MG tablet Take 325 mg by mouth every evening.      . carvedilol (COREG) 12.5 MG tablet Take 1 tablet (12.5 mg total) by mouth 2 (two) times daily with a meal.  180 tablet  3  . enalapril (VASOTEC) 5 MG tablet Take 1 tablet (5 mg total) by mouth 2 (two) times daily.  180 tablet  3  . Fluticasone-Salmeterol (ADVAIR) 500-50 MCG/DOSE AEPB Inhale 1 puff into the lungs every 12 (twelve) hours.      . furosemide (LASIX) 20 MG tablet Take 20 mg by mouth daily as needed. For leg swelling      . glyBURIDE-metformin (GLUCOVANCE) 2.5-500 MG per tablet Take 0.5-1 tablets by mouth 2 (  two) times daily with a meal. 1 tablet am, 1/2 tablet pm      . Guaifenesin (MUCINEX MAXIMUM STRENGTH) 1200 MG TB12 Take 1 tablet by mouth 2 (two) times daily.      . methocarbamol (ROBAXIN) 500 MG tablet Take 1 tablet (500 mg total) by mouth 3 (three) times daily as needed (muscle spasm).  90 tablet  5  . mexiletine (MEXITIL) 150 MG capsule Take 150 mg by mouth 2 (two) times daily.      . pravastatin (PRAVACHOL) 80 MG tablet Take 1 tablet (80 mg total) by mouth daily.  90 tablet  3  . traMADol (ULTRAM) 50 MG tablet Take 1 tablet (50 mg total) by mouth 3 (three) times daily as needed for pain.  90 tablet  6  . fenofibrate 160 MG tablet Take  1 tablet (160 mg total) by mouth daily.  90 tablet  3    Inpatient Medications:    . aspirin  325 mg Oral Daily  . carvedilol  12.5 mg Oral BID WC  .  ceFAZolin (ANCEF) IV  2 g Intravenous On Call  . dextrose      . enalapril  5 mg Oral BID  . fenofibrate  160 mg Oral Daily  . gentamicin irrigation  80 mg Irrigation On Call  . guaiFENesin  1,200 mg Oral BID  . influenza  inactive virus vaccine  0.5 mL Intramuscular Tomorrow-1000  . insulin aspart  0-9 Units Subcutaneous TID WC  . mexiletine  150 mg Oral Q12H  . mometasone-formoterol  2 puff Inhalation BID  . simvastatin  20 mg Oral q1800    Allergies:  Allergies  Allergen Reactions  . Niacin Itching    Niaspan     History   Social History  . Marital Status: Married    Spouse Name: N/A    Number of Children: N/A  . Years of Education: N/A   Occupational History  . Not on file.   Social History Main Topics  . Smoking status: Former Smoker -- 2.0 packs/day for 50 years    Types: Cigarettes    Quit date: 06/24/2000  . Smokeless tobacco: Never Used  . Alcohol Use: Yes     Comment: 05/24/2012 "used to drink alot; quit > 10 yr ago"  . Drug Use: No  . Sexually Active: No   Other Topics Concern  . Not on file   Social History Narrative  . No narrative on file     Family History  Problem Relation Age of Onset  . Heart attack Father   . Heart attack Brother     Physical Exam Well appearing NAD HEENT: Unremarkable Neck:  No JVD, no thyromegally Lungs:  Clear with no wheezes HEART:  Regular rate rhythm, no murmurs, no rubs, no clicks Abd:  Flat, positive bowel sounds, no organomegally, no rebound, no guarding Ext:  2 plus pulses, no edema, no cyanosis, no clubbing Skin:  No rashes no nodules Neuro:  CN II through XII intact, motor grossly intact   Labs:   Lab Results  Component Value Date   WBC 7.8 05/24/2012   HGB 13.6 05/24/2012   HCT 41.6 05/24/2012   MCV 88.9 05/24/2012   PLT 203 05/24/2012      Lab 05/24/12 1955  NA 137  K 5.8*  CL 100  CO2 31  BUN 24*  CREATININE 1.48*  CALCIUM 9.9  PROT --  BILITOT --  ALKPHOS --  ALT --  AST --  GLUCOSE 157*      TELEMETRY: sinus rhythm with PVC's  DEVICE HISTORY: Single chamber MDT ICD implanted 12-2004 with 6949 RV lead.   A/P 1. ICD lead fracture 2. VT 3. ICM Rec: I have discussed the indications, risks,benefits,goals and expectation of ICD generator removal and insertion of a new ICD lead and he wishes to proceed.  Leonia Reeves.D.

## 2012-05-26 ENCOUNTER — Inpatient Hospital Stay (HOSPITAL_COMMUNITY): Payer: Medicare Other

## 2012-05-26 LAB — GLUCOSE, CAPILLARY: Glucose-Capillary: 83 mg/dL (ref 70–99)

## 2012-05-26 MED ORDER — LEVALBUTEROL TARTRATE 45 MCG/ACT IN AERO: 1.0000 | INHALATION_SPRAY | RESPIRATORY_TRACT | Status: DC | PRN

## 2012-05-26 MED ORDER — METFORMIN HCL 500 MG PO TABS: 500.0000 mg | ORAL_TABLET | Freq: Every day | ORAL | Status: DC

## 2012-05-26 NOTE — Progress Notes (Signed)
Patient ID: [name], male   DOB: 1936/04/15, 77 y.o.   MRN: [id number] Subjective:  S/p ICD revision, doing well. Objective:  Vital Signs in the last 24 hours: Temp:  [97.8 F (36.6 C)-98.2 F (36.8 C)] 98.2 F (36.8 C) (01/01 0425) Pulse Rate:  [74-91] 91  (01/01 0425) Resp:  [18-20] 19  (01/01 0425) BP: (120-132)/(62-93) 129/62 mmHg (01/01 0425) SpO2:  [92 %-94 %] 94 % (01/01 0425)  Intake/Output from previous day: 12/31 0701 - 01/01 0700 In: 817.5 [P.O.:240; I.V.:477.5; IV Piggyback:100] Out: 1050 [Urine:1050] Intake/Output from this shift:    Physical Exam: Well appearing NAD HEENT: Unremarkable Neck:  No JVD, no thyromegally Lungs:  Clear with no wheezes. No hematoma HEART:  Regular rate rhythm, no murmurs, no rubs, no clicks Abd:  Flat, positive bowel sounds, no organomegally, no rebound, no guarding Ext:  2 plus pulses, no edema, no cyanosis, no clubbing Skin:  No rashes no nodules Neuro:  CN II through XII intact, motor grossly intact  Lab Results:  Basename 05/25/12 1204 05/24/12 1955  WBC 8.2 7.8  HGB 13.8 13.6  PLT 169 203    Basename 05/25/12 1204 05/25/12 0625 05/24/12 1955  NA -- 138 137  K -- 4.7 5.8*  CL -- 102 100  CO2 -- 28 31  GLUCOSE -- 183* 157*  BUN -- 25* 24*  CREATININE 1.25 1.36* --   No results found for this basename: TROPONINI:2,CK,MB:2 in the last 72 hours Hepatic Function Panel No results found for this basename: PROT,ALBUMIN,AST,ALT,ALKPHOS,BILITOT,BILIDIR,IBILI in the last 72 hours No results found for this basename: CHOL in the last 72 hours No results found for this basename: PROTIME in the last 72 hours  Imaging: No results found.  Cardiac Studies: Tele - nsr Assessment/Plan:  1. ICD lead fracture 2. S/p ICD revision with insertion of a new ICD lead, removal of old lead and ICD testing 3. VT - stable Rec: ok for discharge. Usual followup.  LOS: 2 days    Adir Schicker,M.D. 05/26/2012, 8:52 AM

## 2012-05-26 NOTE — Discharge Summary (Signed)
Physician Discharge Summary  Patient ID: [name] MRN: [id number] DOB/AGE: 1935/06/17 77 y.o.  Admit date: 05/24/2012 Discharge date: 05/26/2012  Primary Discharge Diagnosis: 1. ICD lead fracture 2. S/P ICD revision with insertion of new ICD lead and removal of old lead  Secondary Discharge Diagnosis 1. VT 2. Hx of MI 3. Diabetes 4. Chronic Bronchitis 5. DJD 6. Anxiety  Significant Diagnostic Studies: 1. EP study during ICD lead removal and new lead placement 2. Medtronic Evera XT VR single-chamber defibrillator, serial number [id number] 3. Medtronic model [phone] 62-cm active fixation single coil defibrillation lead, serial number [id number]   Consults: None  Hospital Course:   [name] is a 77 year old patient of Dr. Sharrell Ku, with the ICD generator. He was admitted for removal of a previously implanted generator and insertion of a new ICD generator with ICD pocket revision and defibrillation threshold testing. He has a history of ischemic heart myopathy chronic systolic heart failure with ventricular tachycardia. His ICD lead broke and this was discovered on office visit on 05/24/2012. The patient's old defibrillator had been in for 7 years and had very low voltage and therefor generator change was necessary within the next year. He was felt by Dr. Ladona Ridgel as he had a lead fracture that needs replacement he replaced the generator during that procedure.   The new Medtronic model [id number] 62-cm active fixation single coil defibrillation lead, serial number [id number] was placed into the right ventricle. A new, Medtronic Evera XT VR single-chamber defibrillator, serial number  [id number] was connected to the defibrillation lead and placed back in  the subcutaneous pocket. ICD lead, which was broken was capped a 20 J shock was subsequently delivered, which terminated ventricular fibrillation restored sinus rhythm after induction of ventricular fibrillation by Dr. Ladona Ridgel the  patient tolerated procedure well with no post procedure complications.    The patient was seen and examined by Dr. Gilman Schmidt on day of discharge and found to be stable. He will followup in the device clinic in one and a half weeks for pacemaker incision site evaluation and ongoing instruction. He will also follow up with Dr. Sharrell Ku in 3 months for ongoing assessment and treatment of ventricular tachycardia, ischemic heart myopathy, and continuation of interrogation of ICD pacemaker.    Discharge Exam: Blood pressure 129/62, pulse 91, temperature 98.2 F (36.8 C), temperature source Oral, resp. rate 19, height 5\' 7"  (1.702 m), weight 168 lb 10.4 oz (76.5 kg), SpO2 93.00%.   Labs:   Lab Results  Component Value Date   WBC 8.2 05/25/2012   HGB 13.8 05/25/2012   HCT 41.7 05/25/2012   MCV 88.2 05/25/2012   PLT 169 05/25/2012    Lab 05/25/12 1204 05/25/12 0625  NA -- 138  K -- 4.7  CL -- 102  CO2 -- 28  BUN -- 25*  CREATININE 1.25 --  CALCIUM -- 9.3  PROT -- --  BILITOT -- --  ALKPHOS -- --  ALT -- --  AST -- --  GLUCOSE -- 183*   No results found for this basename: CKTOTAL, CKMB, CKMBINDEX, TROPONINI    Lab Results  Component Value Date   CHOL 136 07/02/2011   CHOL 131 08/28/2009   CHOL 137 02/14/2009   Lab Results  Component Value Date   HDL 30.90* 07/02/2011   HDL 25.36* 08/28/2009   HDL 64.40* 02/14/2009   Lab Results  Component Value Date   LDLCALC 84 07/02/2011   LDLCALC 70 08/28/2009   LDLCALC 83  02/14/2009   Lab Results  Component Value Date   TRIG 105.0 07/02/2011   TRIG 139.0 08/28/2009   TRIG 142.0 02/14/2009   Lab Results  Component Value Date   CHOLHDL 4 07/02/2011   CHOLHDL 4 08/28/2009   CHOLHDL 5 02/14/2009   Lab Results  Component Value Date   LDLDIRECT 64.7 12/31/2007   LDLDIRECT 69.0 01/15/2007      Radiology: Dg Chest 2 View  05/26/2012  *RADIOLOGY REPORT*  Clinical Data: ICD revision  CHEST - 2 VIEW  Comparison: 04/05/2012  Findings: 0613 hours.  The lungs are clear without focal infiltrate, edema, pneumothorax or pleural effusion. Interstitial markings are diffusely coarsened with chronic features.  Bilateral calcified pleural plaques again noted. The cardiopericardial silhouette is enlarged.  Interval ICD revision with two leads now evident. Telemetry leads overlie the chest.  IMPRESSION: Status post left AICD revision.  No pneumothorax.  Emphysema without acute cardiopulmonary findings.   Original Report Authenticated By: Kennith Center, M.D.     FOLLOW UP PLANS AND APPOINTMENTS Discharge Orders    Future Appointments: Provider: Department: Dept Phone: Center:   06/03/2012 2:30 PM [facility] Main Office [place]) [phone] [facility]     Future Orders Please Complete By Expires   Diet - low sodium heart healthy      Increase activity slowly      Discharge wound care:      Scheduling Instructions:   See additional sheet provided by the nurses for post pacemaker activity. Do not lift arm above your head for 4 days. No driving for 2 weeks.   Leave dressing on - Keep it clean, dry, and intact until clinic visit          Medication List     As of 05/26/2012 10:12 AM    TAKE these medications         aspirin EC 325 MG tablet   Take 325 mg by mouth every evening.      carvedilol 12.5 MG tablet   Commonly known as: COREG   Take 1 tablet (12.5 mg total) by mouth 2 (two) times daily with a meal.      enalapril 5 MG tablet   Commonly known as: VASOTEC   Take 1 tablet (5 mg total) by mouth 2 (two) times daily.      fenofibrate 160 MG tablet   Take 1 tablet (160 mg total) by mouth daily.      Fluticasone-Salmeterol 500-50 MCG/DOSE Aepb   Commonly known as: ADVAIR   Inhale 1 puff into the lungs every 12 (twelve) hours.      furosemide 20 MG tablet   Commonly known as: LASIX   Take 20 mg by mouth daily as needed. For leg swelling      glyBURIDE-metformin 2.5-500 MG per tablet   Commonly known as:  GLUCOVANCE   Take 0.5-1 tablets by mouth 2 (two) times daily with a meal. 1 tablet am, 1/2 tablet pm      levalbuterol 45 MCG/ACT inhaler   Commonly known as: XOPENEX HFA   Inhale 1-2 puffs into the lungs every 4 (four) hours as needed for wheezing or shortness of breath.      metFORMIN 500 MG tablet   Commonly known as: GLUCOPHAGE   Take 1 tablet (500 mg total) by mouth daily with breakfast.      methocarbamol 500 MG tablet   Commonly known as: ROBAXIN   Take 1 tablet (500 mg total) by mouth 3 (  three) times daily as needed (muscle spasm).      mexiletine 150 MG capsule   Commonly known as: MEXITIL   Take 150 mg by mouth 2 (two) times daily.      MUCINEX MAXIMUM STRENGTH 1200 MG Tb12   Generic drug: Guaifenesin   Take 1 tablet by mouth 2 (two) times daily.      pravastatin 80 MG tablet   Commonly known as: PRAVACHOL   Take 1 tablet (80 mg total) by mouth daily.      traMADol 50 MG tablet   Commonly known as: ULTRAM   Take 1 tablet (50 mg total) by mouth 3 (three) times daily as needed for pain.           Follow-up Information    Follow up with [facility] (Pacemaker wound check and follow up in 1 and 1/2 weeks. Our office will call you for appoitnment)    Contact information:   [address] [place] [address]       Follow up with Lewayne Bunting, MD. In 3 months. (Our offce will call you for appointment)    Contact information:   [address] [place] [address] [phone]            Time spent with patient to include physician time:45 minutes Signed: Joni Reining 05/26/2012, 10:12 AM Co-Sign MD

## 2012-05-26 NOTE — Op Note (Signed)
[name], [name]            ACCOUNT NO.:  [id number]  MEDICAL RECORD NO.:  [id number]  LOCATION:  2019                         FACILITY:  [facility]  PHYSICIAN:  Doylene Canning. Ladona Ridgel, MD    DATE OF BIRTH:  1936-04-19  DATE OF PROCEDURE:  05/25/2012 DATE OF DISCHARGE:                              OPERATIVE REPORT   PROCEDURE PERFORMED:  Removal of a previously implanted ICD generator, insertion of a new ICD lead and insertion of a new ICD generator with ICD pocket revision and defibrillation of threshold testing.  INTRODUCTION:  The patient is a 77 year old man with an ischemic cardiomyopathy and chronic systolic heart failure with ventricular tachycardia.  His ICD lead broke and this was discovered yesterday.  He was admitted to the hospital.  He is now referred for revision of his new ICD system.  Of note, the patient's old defibrillator has been in for 7 years and has a very low voltage and will be up for ICD generator change within the next year and for this reason, he will have a new device placed at the time of his new ICD lead insertion.  PROCEDURE:  After informed consent was obtained, the patient was taken to the Diagnostic EP Lab in the fasting state.  After usual preparation and draping, intravenous fentanyl and midazolam was given for sedation. A 30 mL of lidocaine was infiltrated into the left infraclavicular region.  A 7-cm incision was carried out over this region and electrocautery was utilized to dissect down to the fascial plane. Previously venography of the left upper extremity venous system had demonstrated that the left subclavian vein was patent that was then punctured.  The new Medtronic model [id number] 62-cm active fixation single coil defibrillation lead, serial number [id number] was advanced into the right ventricle.  Mapping was carried out in the right ventricle and the RV septum.  The R-wave was measured 16 millivolts, the pacing impedance was 700 ohms, and  threshold once the lead was actively fixed was 0.5 volts at 0.5 milliseconds.  There was large injury current present. With these satisfactory parameters, the lead was secured to the subpectoral fascia with a figure-of-eight silk suture, and the sewing sleeve was secured with silk suture.  At this point, electrocautery was utilized to remove the ICD generator.  This was accomplished without complication.  At this point, the ICD pocket was revised to accommodate the new device and the new lead.  Having accomplished this, the new Medtronic Evera XT VR single-chamber defibrillator, serial number [id number] was connected to the defibrillation lead and placed back in the subcutaneous pocket.  The old ICD lead, which had broken was capped. At this point, the incision was closed with 2-0 and 3-0 Vicryl.  At this point, I scrubbed out of the case for defibrillation threshold testing.  After the patient was more deeply sedated under my direct supervision with fentanyl and Versed, ventricular fibrillation was induced with a T- wave shock.  A 20-joule shock was subsequently delivered, which terminated ventricular fibrillation and restored sinus rhythm.  No additional defibrillation threshold testing was carried out, and the benzoin and Steri-Strips were painted on the skin.  A pressure dressing was applied and the patient  returned to his room in satisfactory condition.  COMPLICATIONS:  There were no immediate procedure complications.  RESULTS:  This demonstrate successful removal of a previously implanted Medtronic ICD followed by capping of the old ICD lead, which had broken followed by insertion of a new Medtronic ICD system with a 6935 lead and a new Medtronic single chamber defibrillator.     Doylene Canning. Ladona Ridgel, MD     GWT/MEDQ  D:  05/25/2012  T:  05/25/2012  Job:  [id number]  cc:   [name] Salvia, MD, [facility]

## 2012-05-26 NOTE — Progress Notes (Signed)
Pt discharge instructions and patient education completed. IV site d/c. Site WNL. ICD restrictions reviewed with patient and wife. No further questions. D/C home with wife. Dion Saucier

## 2012-05-30 ENCOUNTER — Encounter: Payer: Self-pay | Admitting: Pulmonary Disease

## 2012-05-30 NOTE — Progress Notes (Signed)
Subjective:     Patient ID: [name], male   DOB: September 22, 1935, 77 y.o.   MRN: [id number]  HPI 77 y/o WM here for a follow up visit... he has multiple medical problems as noted below...   ~  Sep10:  I last saw him 9/09 for routine f/u visit- stable on his regular dosing of Advair500, Mucinex, & Xopenex... he missed several f/u appts and in the interim stopped his Metoprolol, Vasotec, ?others... he has followed up w/ DrHochrein & DrKlein- doing well, he says, & DrH restarted BBlocker Coreg & Enalapril Rx... he states that he's been regular w/ his Glucovance (2.5/500- 1/2 Bid) although he hasn't checked his BS recently... he also states that he's been taking his Prav80 + Fenofib160 daily (overdue for fasting labs)... I called [facility] on [address]- they confirmed regular refills recently.  ~  Jan11:  he had a bronchitic exac rx'd w/ Avelox, Pred, Mucinex, Tussionex & improved... discussed the need to continue the AdvairBid regularly + the Mucinex Bid, etc... DrHochrein recently incr the Coreg to 12.5Bid & decr the Lasix to 20mg  Qod due to low BP... he has mod seb derm- Rx w/ Selsun, T-gel & Lotrisone cream... ~  Apr11:  he reports a good 2mo- no new complaints or concerns... states he's not taking Lasix regularly, just Prn for swelling since BP is low... he notes occas nocturnal hypoglycenic episodes but has been taking the PM Glucovance Qhs> we discussed changing this to dinnertime dose... no CP, palpit/ resp exacerbations, etc... ~  Oct11:  he has one AB exac 7/11- seen by TP & Rx w/ Avelox, Pred, etc... otherw doing fine- no new complaints or concerns- he's retired & living at [facility] 1/2 the time, playing golf etc... BP controlled;  no CP, palpit, ch in SOB, etc;  due for f/u Appt w/ DrHochrein & CDopplers soon; he is not fasting this AM & will call us when he is ready to do his fasting labs> notes occas low BS if he skips lunch...  ~  November 04, 2011:  320mo ROV & Teven states that he  is doing fine- no new complaints or concerns, he didn't even bring his meds or med list for review...     He saw DrAllred 1/13> doing well w/o CP, palpit, SOB, orthop, PND, etc; ICD interrogated & functioning normally, no med changes made...    He saw DrHochrein 1/13> doing satis & relatively asymptomatic but not exercising; rec to continue aggressive risk reduction strategy; same meds...    We reviewed prob list, meds, xrays and labs> see below>> CXR 6/13 showed stable heart size s/p CABG, pacer, hyperinflated/ clear lungs w/ interstitial prominence, DJD in TSpine... LABS 2/13 by DrHochrein:  TChol 136, TG 105, HDL 31, LDL 84 LABS 6/13:  Chems- HY=86 A1c=7.0 BUN=21 Creat=1.4 K=5.5   ~  May 04, 2012:  22mo ROV & [place] notes some incr congestion & cough, he had resp exac 11/13 treated by TP w/ Augmentin & Pred- improved;  He is also c/o LBP x several weeks, pain is incr w/ movement, not radiating to his legs- he is rec to rest, apply heat, & try Tramadol & Robaxin Tid, we will also set up referral to Ortho- DrWainer...  We reviewed the following medical problems during today's office visit>>     AB, COPD> on Advair500Bid, Xopenex prn, Mucinex-2Bid, off Pred; recent exac treated w/ Augmentin & Pred- improved...    HBP> on Coreg12.5Bid, Vasotec5Bid, Lasix20; BP= 100/58 &  he denies CP, palpit, ch in SOB/DOE, edema, etc...    CAD> on ASA325; Hx Lmain + 3vessel dis & CABGx4 in 2003; f/u cath 2008 w/ atretic LIMA to LAD;     CHF, Ischemic Cardiomyop> EF= 35-40% by 2DEcho 2/13; he continues regular f/u w/ DrHochrein...    Hx VTach, AICD> on Mexiletine150Bid; he is followed by DrKlein/Allred & stable on Mexiletine w/o AICD firing...    ASPVDz> on ASA325; Hx PTA to right SFA for ABI=0.36 in past; not active enough for claud; known plaque in Carotids w/o signif flow reduction...    CHOL> on Prav80, Feno160; last FLP 2/13 showed TChol 136, TG 105, HDL 31, LDL 84    DM> on Glucovance2.5-500; labs today  showed BS=134, A1c=7.5 and asked to incr Glucov to an extra 1/2 tab in PM...    GI- Divertics, Polyps> hx adenomatous polyp in 1997, last colonoscopy 2002 by [facility] w/ divertics only; he is overdue for f/u colon but declines to sched f/u appt...    Renal Insuffic> baseline Creat= 1.4-1.5; labs today reveals BUN=27, Creat=1,8, K=5.2; he is rec to decr the Lasix20 to just PRN for edema...    DJD> followed by DrWainer w/ shots in shoulders in the past; now c/o LBP & we will Rx w/ rest, heat, Tramadol, Robaxin & refer to DrWainer...    Anxiety> aware- he is not on an anxiolytic med by his pref... We reviewed prob list, meds, xrays and labs> see below for updates >>  LABS 12/13 (not fasting):  Chems- BS=158, A1c=7.5, BUN=27, Creat=1.8, K=5.2    Problem List:  ASTHMATIC BRONCHITIS, ACUTE (ICD-466.0) - occas acute infectious exacerbations... ~  12/10: had bronchitic exac Rx'd w/ Avelox, Pred, Mucinex, etc & improved==> CXR showed COPD/E, incr interstitial markings & scarring, calcif pleural plaques, enlarged heart & pacer, NAD.Marland Kitchen.  ~  7/11:  similar episode, responded to similar meds... ~  11/13: similar resp exac treated by TP w/ Augmentin & Pred- improved...  COPD (ICD-496) - ex-smoker, quit 2002... he clearly does better on regular dosing of ADVAIR 500Bid, MUCINEX 1200mg Bid, XOPENEX Prn... urged to continue these regularly and get more regular exercise... ~  CXR 6/13 showed stable heart size s/p CABG, pacer, hyperinflated/ clear lungs w/ interstitial prominence, DJD in TSpine... ~  CXR 11/13 showed cardiomeg & prev CABG, left subclav AICD, atherosclerotic calcif of Ao, calcif pleural plaques, emphysema w/o acute changes...  HYPERTENSION (ICD-401.9) - meds for BP & his Chr CHF= COREG 12.5mg Bid,  ENALAPRIL 5mg Bid, & LASIX 20mg /d...   ~  labs 1/10 showed K= 5.6, BUN= 31, Creat= 1.9..Marland Kitchen ACE/ ARB stopped... pt didn't follow up. ~  8/10: Coreg + Enalapril restarted by DrHochrein... ~  labs 9/10 showed  K=5.6, BUN=19, Creat=1.5 ~  labs 12/10 showed K=4.4, BUN= 32, Creat= 1.7, BNP= 102 ~  labs 4/11 showed K=5.1, BUN=16, Creat=1.4 ~  10/11: BP=122/68 today, and feeling well- he denies HA, fatigue, visual changes, CP, palipit, dizziness, syncope, edema, etc. ~  labs 2/13 showed K=5.7, BUN=27, Creat=1.5 ~  6/13: BP= 112/62 and he denies CP, palpit, dizzy, syncope, ch in SOB, edema... ~  Labs 6/13 showed K=5.5, BUN=21, Creat=1.4 ~  12/13: on Coreg12.5Bid, Vasotec5Bid, Lasix20; BP= 100/58 & he denies CP, palpit, ch in SOB/DOE, edema, etc; we cut the LASIX to 20mg  prn swelling due to Creat=1.8  CORONARY ARTERY DISEASE (ICD-414.00) - no chest pains, no palpit, no defib discharges... ~ Hx 3 vessel CAD w/ CABG x4 in 2/03 for [facility] dis & 3vessel  dis + mod LVD EF=35% at that time... ~ Cath 7/06 w/ non-filling of SVG to PDA, & LIMA to LAD was atretic, w/ EF=45%... ~ Cath 10/08 w/ norm SVG to PDA, & atretic LIMA to LAD (due to good native vessel flow), EF=40%... note is made of a 40-50% LMain lesion... ~  EKG 1/13 showed SBrady, rightward axis, IVCD, NSSTTWA...  CONGESTIVE HEART FAILURE, SYSTOLIC, CHRONIC (ICD-428.0) - ischemic cardiomyopathy & meds as above... followed by DrHochrein in the CHF clinic...  see labs above>> ~  seen 8/10 in CHF clinic- Coreg + Enalapril restarted, labs reviewed. ~  2DEcho 2/13 showed LV mildly dilated w/ diffuse HK, incr wall thickness c/w mild LVH, EF=35-40%, mild MR, mild LAdil... ~  Labs 6/13 showed Creat=1.4 & BNP=83 ~  Labs 12/13 showed Creat=1.8 and his Lasix20 was cut to just prn for swelling...  Hx of VENTRICULAR TACHYCARDIA (ICD-427.1) - prev documented medication non-compliance w/ Amio & Mexilitene... DrHochrein has adjusted his meds... ~ hx VTach storm w/ AICD placed, followed by DrTaylor/Klein on meds... ~ DC'd 10/08 on Amiodarone 200mg - 2 tabs twice daily, and MEXILETINE 150mg Bid... subseq decr Amio 200mg Bid... ~ f/u w/ DrKlein 9/09 w/ defib check- doing  well... ~  f/u 9/10 w/ DrKlein on Mexilitene alone, Amio stopped for intolerance... doing well- no changes made. ~  f/u 1/13 w/ DrAllred> stable on Mexilitene, & no changes made...  PERIPHERAL VASCULAR DISEASE (ICD-443.9) >> on ASA 325mg /d... ~ s/p PTA to right SFA for an ABI of 0.36...  ~  CDopplers 10/09 showed bilat carotid disease w/ bruits and mod plaque in bulbs, 0-39% bilat... no change. ~  CDopplers 11/11 showed mod heterogeneous plaque in bulbs, 0-39% bilat ICA stenoses, f/u 49yrs...  HYPERCHOLESTEROLEMIA (ICD-272.0) - prev on Simvastatin 40mg /d, and changed to PRAVASTATIN 80mg /d per DrHochrein (due to interaction of Simva & Amio)... FENOFIBRATE 160mg /d added 8/09... ~  FLP 8/08 on Simva40 showed TChol 136, TG 297, HDL 22, LDL 69- but he was ?not taking meds regularly? he freq forgets. ~  FLP 8/09 on Prav80 showed TChol 124, TG 257, HDL 24, LDL 65... rec to add Fenofibrate 160/d... ~  FLP 9/10 on Prav80+Feno160 showed TChol 137, TG 142, HDL 26, LDL 83... continue both meds. ~  FLP 4/11 on Prav80+Feno160 showed TChol 131, TG 139, HDL 33, LDL 70 ~  FLP 10/11 = pt didn't go to the lab for blood work... ~  FLP 2/13 on Prav80+Feno160 showed TChol 136, TG 105, HDL 31, LDL 84  DIABETES MELLITUS (ICD-250.00) - on GLUCOVANCE 2.5/500- 1/2 Bid... he states his home BS checks have all been around 100 w/ occas nocturnal hypoglycemia> advised to take PM dose at dinner, not bedtime. ~   prev BS=177, HgA1c=7.0 in Oct08... ~  labs 2/09 showed BS=83, HgA1c=6.8 ~  labs 8/09 showed BS= 118, HgA1c= 7.3 ~  labs 9/10 showed BS= 90, A1c= 7.2 ~  labs 4/11 showed BS= 65, A1c= 6.9 ~  labs 2/13 showed BS= 76-171 ~  Labs 6/13 showed BS= 67, A1c=7.0 ~  on Glucovance2.5-500; labs today showed BS=134, A1c=7.5 and asked to incr Glucov to an extra 1/2 tab in PM...  DIVERTICULOSIS OF COLON (ICD-562.10) - last colonoscopy was 10/02 by [facility] showing divertics only... prev polypectomy 1997 for tubular adenoma...  overdue for f/u exam... COLONIC POLYPS (ICD-211.3)  RENAL INSUFFICIENCY (ICD-588.9) - Creat in the 1.6-1.7 range... ~  labs 8/09 showed BUN= 14, Creat= 1.2 ~  labs 1/10 showed BUN= 31, Creat= 1.9 & ACE was held.Marland KitchenMarland Kitchen  f/u improved. ~  labs 9/10 showed BUN= 19, Creat= 1.5 ~  labs 4/11 showed BUN= 16, Creat= 1.4 ~  Labs 2/13 showed BUN= 27, Creat= 1.5 ~  Labs 6/13 showed BUN= 21, Creat= 1.4 ~  baseline Creat= 1.4-1.5; labs today reveals BUN=27, Creat=1,8, K=5.2; he is rec to decr the Lasix20 to just PRN for edema...  DEGENERATIVE JOINT DISEASE (ICD-715.90) ~  2/12:  Seen by DrWainer w/ bilat shoulder pain; XRays showed glenohumeral degen changes; given bilat injections w/ relief... ~  12/13:  He is c/o LBP> we rec refewrral to Ortho for further eval; Rx w/ rest, heat, TRAMADOL, ROBAXIN...  ANXIETY (ICD-300.00)  SEBORRHEIC DERMATITIS (ICD-690.10) - he has mod seb derm- discussed Rx w/ Selsun/ T-Gel/ Lotrisone Cream Prn...   Past Surgical History  Procedure Date  . Coronary artery bypass graft 1990's    CABG X4  . Tonsillectomy ?1942  . Cardiac defibrillator placement 12/2004    single chamber defibrillator- Medtronic Maxima 8/06 DrKlein [Other][    Outpatient Encounter Prescriptions as of 05/04/2012  Medication Sig Dispense Refill  . carvedilol (COREG) 12.5 MG tablet Take 1 tablet (12.5 mg total) by mouth 2 (two) times daily with a meal.  180 tablet  3  . enalapril (VASOTEC) 5 MG tablet Take 1 tablet (5 mg total) by mouth 2 (two) times daily.  180 tablet  3  . fenofibrate 160 MG tablet Take 1 tablet (160 mg total) by mouth daily.  90 tablet  3  . pravastatin (PRAVACHOL) 80 MG tablet Take 1 tablet (80 mg total) by mouth daily.  90 tablet  3  . [DISCONTINUED] ADVAIR DISKUS 500-50 MCG/DOSE AEPB INHALE ONE DOSE BY MOUTH TWICE DAILY  60 each  6  . [DISCONTINUED] aspirin 325 MG EC tablet Take 325 mg by mouth daily.       . [DISCONTINUED] furosemide (LASIX) 20 MG tablet Take 1 tablet (20 mg  total) by mouth as needed.  90 tablet  1  . [DISCONTINUED] glyBURIDE-metformin (GLUCOVANCE) 2.5-500 MG per tablet TAKE ONE-HALF TABLET BY MOUTH TWICE DAILY WITH BREAKFAST AND DINNER  30 tablet  6  . [DISCONTINUED] guaiFENesin (MUCINEX) 600 MG 12 hr tablet Take 1,200 mg by mouth 2 (two) times daily.       . [DISCONTINUED] levalbuterol (XOPENEX HFA) 45 MCG/ACT inhaler Inhale 1-2 puffs into the lungs every 4 (four) hours as needed.        . [DISCONTINUED] mexiletine (MEXITIL) 150 MG capsule TAKE ONE CAPSULE BY MOUTH TWICE DAILY  60 capsule  6  . methocarbamol (ROBAXIN) 500 MG tablet Take 1 tablet (500 mg total) by mouth 3 (three) times daily as needed (muscle spasm).  90 tablet  5  . traMADol (ULTRAM) 50 MG tablet Take 1 tablet (50 mg total) by mouth 3 (three) times daily as needed for pain.  90 tablet  6  . [DISCONTINUED] predniSONE (DELTASONE) 10 MG tablet 4 tabs for 2 days, then 3 tabs for 2 days, 2 tabs for 2 days, then 1 tab for 2 days, then stop  20 tablet  0    Allergies  Allergen Reactions  . Niacin Itching    Niaspan     Current Medications, Allergies, Past Medical History, Past Surgical History, Family History, and Social History were reviewed in [employer] record.   Review of Systems        See HPI - all other systems neg except as noted...       The patient  complains of dyspnea on exertion.  The patient denies anorexia, fever, weight loss, weight gain, vision loss, decreased hearing, hoarseness, chest pain, syncope, peripheral edema, prolonged cough, headaches, hemoptysis, abdominal pain, melena, hematochezia, severe indigestion/heartburn, hematuria, incontinence, muscle weakness, suspicious skin lesions, transient blindness, difficulty walking, depression, unusual weight change, abnormal bleeding, enlarged lymph nodes, and angioedema.     Objective:   Physical Exam    WD, WN, 77 y/o WM in NAD... GENERAL:  Alert & oriented; pleasant &  cooperative... HEENT:  [place]/AT, EOM-wnl, PERRLA, EACs-clear, TMs-wnl, NOSE-clear, THROAT-clear & wnl. NECK:  Supple w/ fairROM; no JVD; normal carotid impulses w/o bruits; no thyromegaly or nodules palpated; no lymphadenopathy. CHEST:  Few scat rhonchi without wheezing, rales, or signs of consolid... HEART:  Regular Rhythm;  gr1/6 SEM, without rubs or gallops heard... ABDOMEN:  Soft & nontender; normal bowel sounds; no organomegaly or masses detected. EXT: without deformities, mild arthritic changes; no varicose veins/ venous insuffic/ or edema. NEURO:  CN's intact; motor testing normal; sensory testing normal; gait normal & balance OK. DERM:  No lesions noted; no rash etc...  RADIOLOGY DATA:  Reviewed in the EPIC EMR & discussed w/ the patient...  LABORATORY DATA:  Reviewed in the EPIC EMR & discussed w/ the patient...    Assessment:      COPD/ AB>  Stable on Advair, Xopenex, Mucinex; asked to take meds regularly & incr exercise...  HBP>  Controlled on meds, continue same, asked to monitor at home...  CAD>  S/p CABG, he is too sedentary & asked to incr exercise...  CHF, Cardiomyopathy>  Followed by DrHochrein; continue meds...  Hx VTach>  Stable on Mexilitene & has AICD monitored by DrKlein et al...  ASPVD>  He is s/p right SFA PTA & doing satis, but needs to incr exercise & continue ASA...  CHOL>  On Prav80 + Feno160; last FLP by DrHochrein 2/13 looked good x lowHDL needs incr exercise...  DM>  On diet + Glucovance; A1c is sl worse & we incr the Glucov to include an extra 1/2 tab in PM...  Renal Insuffic>  Creat= 1.4 at baseline & 1.8 currently, we are holding the Lasix to just prn edema...  DJD>  Aware, stable on OTC meds...  Anxiety>  Stable & not on anxiolytic Rx...     Plan:     Patient's Medications  New Prescriptions   LEVALBUTEROL (XOPENEX HFA) 45 MCG/ACT INHALER    Inhale 1-2 puffs into the lungs every 4 (four) hours as needed for wheezing or shortness of breath.    METFORMIN (GLUCOPHAGE) 500 MG TABLET    Take 1 tablet (500 mg total) by mouth daily with breakfast.   METHOCARBAMOL (ROBAXIN) 500 MG TABLET    Take 1 tablet (500 mg total) by mouth 3 (three) times daily as needed (muscle spasm).   TRAMADOL (ULTRAM) 50 MG TABLET    Take 1 tablet (50 mg total) by mouth 3 (three) times daily as needed for pain.  Previous Medications   ASPIRIN EC 325 MG TABLET    Take 325 mg by mouth every evening.   CARVEDILOL (COREG) 12.5 MG TABLET    Take 1 tablet (12.5 mg total) by mouth 2 (two) times daily with a meal.   ENALAPRIL (VASOTEC) 5 MG TABLET    Take 1 tablet (5 mg total) by mouth 2 (two) times daily.   FENOFIBRATE 160 MG TABLET    Take 1 tablet (160 mg total) by mouth daily.   FLUTICASONE-SALMETEROL (ADVAIR) 500-50 MCG/DOSE  AEPB    Inhale 1 puff into the lungs every 12 (twelve) hours.   FUROSEMIDE (LASIX) 20 MG TABLET    Take 20 mg by mouth daily as needed. For leg swelling   GLYBURIDE-METFORMIN (GLUCOVANCE) 2.5-500 MG PER TABLET    Take 0.5-1 tablets by mouth 2 (two) times daily with a meal. 1 tablet am, 1/2 tablet pm   GUAIFENESIN (MUCINEX MAXIMUM STRENGTH) 1200 MG TB12    Take 1 tablet by mouth 2 (two) times daily.   MEXILETINE (MEXITIL) 150 MG CAPSULE    Take 150 mg by mouth 2 (two) times daily.   PRAVASTATIN (PRAVACHOL) 80 MG TABLET    Take 1 tablet (80 mg total) by mouth daily.  Modified Medications   No medications on file  Discontinued Medications   ADVAIR DISKUS 500-50 MCG/DOSE AEPB    INHALE ONE DOSE BY MOUTH TWICE DAILY   ASPIRIN 325 MG EC TABLET    Take 325 mg by mouth daily.    FUROSEMIDE (LASIX) 20 MG TABLET    Take 1 tablet (20 mg total) by mouth as needed.   GLYBURIDE-METFORMIN (GLUCOVANCE) 2.5-500 MG PER TABLET    TAKE ONE-HALF TABLET BY MOUTH TWICE DAILY WITH BREAKFAST AND DINNER   GUAIFENESIN (MUCINEX) 600 MG 12 HR TABLET    Take 1,200 mg by mouth 2 (two) times daily.    LEVALBUTEROL (XOPENEX HFA) 45 MCG/ACT INHALER    Inhale 1-2 puffs into the  lungs every 4 (four) hours as needed.     MEXILETINE (MEXITIL) 150 MG CAPSULE    TAKE ONE CAPSULE BY MOUTH TWICE DAILY   PREDNISONE (DELTASONE) 10 MG TABLET    4 tabs for 2 days, then 3 tabs for 2 days, 2 tabs for 2 days, then 1 tab for 2 days, then stop

## 2012-06-03 ENCOUNTER — Other Ambulatory Visit: Payer: Self-pay | Admitting: Internal Medicine

## 2012-06-03 ENCOUNTER — Ambulatory Visit (INDEPENDENT_AMBULATORY_CARE_PROVIDER_SITE_OTHER): Payer: Medicare Other | Admitting: *Deleted

## 2012-06-03 ENCOUNTER — Ambulatory Visit: Payer: Medicare Other

## 2012-06-03 DIAGNOSIS — I472 Ventricular tachycardia: Secondary | ICD-10-CM

## 2012-06-03 DIAGNOSIS — I5022 Chronic systolic (congestive) heart failure: Secondary | ICD-10-CM

## 2012-06-03 LAB — ICD DEVICE OBSERVATION
HV IMPEDENCE: 79 Ohm
RV LEAD IMPEDENCE ICD: 380 Ohm
TZON-0003SLOWVT: 428.5 ms

## 2012-06-03 NOTE — Progress Notes (Signed)
Wound check-ICD 

## 2012-06-16 ENCOUNTER — Encounter: Payer: Self-pay | Admitting: Internal Medicine

## 2012-06-28 ENCOUNTER — Other Ambulatory Visit: Payer: Self-pay | Admitting: Cardiology

## 2012-07-12 ENCOUNTER — Other Ambulatory Visit: Payer: Self-pay | Admitting: Internal Medicine

## 2012-07-15 ENCOUNTER — Other Ambulatory Visit: Payer: Self-pay | Admitting: Internal Medicine

## 2012-07-22 ENCOUNTER — Other Ambulatory Visit: Payer: Self-pay | Admitting: Cardiology

## 2012-08-05 ENCOUNTER — Other Ambulatory Visit: Payer: Self-pay | Admitting: Pulmonary Disease

## 2012-08-05 ENCOUNTER — Other Ambulatory Visit: Payer: Self-pay | Admitting: Emergency Medicine

## 2012-08-05 MED ORDER — FLUTICASONE-SALMETEROL 500-50 MCG/DOSE IN AEPB: 1.0000 | INHALATION_SPRAY | Freq: Two times a day (BID) | RESPIRATORY_TRACT | Status: DC

## 2012-08-05 MED ORDER — MEXILETINE HCL 150 MG PO CAPS: 150.0000 mg | ORAL_CAPSULE | Freq: Two times a day (BID) | ORAL | Status: DC

## 2012-08-06 ENCOUNTER — Other Ambulatory Visit: Payer: Self-pay | Admitting: Pulmonary Disease

## 2012-08-19 ENCOUNTER — Ambulatory Visit (INDEPENDENT_AMBULATORY_CARE_PROVIDER_SITE_OTHER): Payer: Medicare Other | Admitting: Internal Medicine

## 2012-08-19 ENCOUNTER — Encounter: Payer: Self-pay | Admitting: Internal Medicine

## 2012-08-19 VITALS — BP 114/60 | HR 65 | Ht 67.0 in | Wt 169.8 lb

## 2012-08-19 DIAGNOSIS — I472 Ventricular tachycardia: Secondary | ICD-10-CM

## 2012-08-19 DIAGNOSIS — Z95810 Presence of automatic (implantable) cardiac defibrillator: Secondary | ICD-10-CM

## 2012-08-19 DIAGNOSIS — I509 Heart failure, unspecified: Secondary | ICD-10-CM

## 2012-08-19 DIAGNOSIS — I5022 Chronic systolic (congestive) heart failure: Secondary | ICD-10-CM

## 2012-08-19 LAB — ICD DEVICE OBSERVATION
DEV-0020ICD: NEGATIVE
RV LEAD THRESHOLD: 0.75 V
TZON-0003SLOWVT: 428.5 ms

## 2012-08-19 MED ORDER — FUROSEMIDE 40 MG PO TABS: 40.0000 mg | ORAL_TABLET | Freq: Every day | ORAL | Status: DC

## 2012-08-19 NOTE — Assessment & Plan Note (Signed)
The patient's ICD is working normally. He's had no ventricular arrhythmia therapies. He'll continue his current medical therapy. We'll plan to recheck his ICD in several months.

## 2012-08-19 NOTE — Patient Instructions (Signed)
Your physician has recommended you make the following change in your medication:  1) Increase lasix (furosemide) to 40 mg one tablet by mouth daily  Remote monitoring is used to monitor your Pacemaker of ICD from home. This monitoring reduces the number of office visits required to check your device to one time per year. It allows [place] to keep an eye on the functioning of your device to ensure it is working properly. You are scheduled for a device check from home on 11/22/12. You may send your transmission at any time that day. If you have a wireless device, the transmission will be sent automatically. After your physician reviews your transmission, you will receive a postcard with your next transmission date.  Your physician wants you to follow-up in: December with Dr. Ladona Ridgel. You will receive a reminder letter in the mail two months in advance. If you don't receive a letter, please call our office to schedule the follow-up appointment.

## 2012-08-19 NOTE — Assessment & Plan Note (Signed)
The patient has maintaining sinus rhythm on mexiletine. He will continue his current medical therapy.

## 2012-08-19 NOTE — Assessment & Plan Note (Signed)
We discussed the importance of maintaining a low-sodium diet. He will continue his current medical therapy, but because of dyspnea on exertion, I've asked the patient to increase his Lasix dose from 20 mg daily to 40 mg daily. A low-sodium diet has been emphasized. He will continue his current medical therapy.

## 2012-08-19 NOTE — Progress Notes (Signed)
HPI [name] returns today for followup. He is a very pleasant 77 year old man with an ischemic cardiomyopathy, ventricular tachycardia, chronic systolic heart failure, status post ICD implantation. In the interim, he has done well except he has continued heart failure symptoms, class IIb. The patient admits to dietary indiscretion. He likes date salt. He denies medical noncompliance. He has had no ICD shocks and denies chest pain or peripheral edema. Allergies  Allergen Reactions  . Niacin Itching    Niaspan      Current Outpatient Prescriptions  Medication Sig Dispense Refill  . ADVAIR DISKUS 500-50 MCG/DOSE AEPB INHALE ONE DOSE BY MOUTH TWICE DAILY  60 each  5  . aspirin EC 325 MG tablet Take 325 mg by mouth every evening.      . carvedilol (COREG) 12.5 MG tablet TAKE ONE TABLET BY MOUTH TWICE DAILY WITH MEALS  180 tablet  0  . enalapril (VASOTEC) 5 MG tablet TAKE ONE TABLET BY MOUTH TWICE DAILY  180 tablet  0  . fenofibrate 160 MG tablet TAKE ONE TABLET BY MOUTH DAILY  90 tablet  0  . glyBURIDE-metformin (GLUCOVANCE) 2.5-500 MG per tablet Take 0.5-1 tablets by mouth 2 (two) times daily with a meal. 1 tablet am, 1/2 tablet pm      . Guaifenesin (MUCINEX MAXIMUM STRENGTH) 1200 MG TB12 Take 1 tablet by mouth 2 (two) times daily.      Marland Kitchen levalbuterol (XOPENEX HFA) 45 MCG/ACT inhaler Inhale 1-2 puffs into the lungs every 4 (four) hours as needed for wheezing or shortness of breath.  1 Inhaler  6  . mexiletine (MEXITIL) 150 MG capsule TAKE ONE CAPSULE BY MOUTH TWICE DAILY  60 capsule  0  . pravastatin (PRAVACHOL) 80 MG tablet TAKE ONE TABLET BY MOUTH DAILY  90 tablet  0  . furosemide (LASIX) 40 MG tablet Take 1 tablet (40 mg total) by mouth daily.  90 tablet  3   No current facility-administered medications for this visit.     Past Medical History  Diagnosis Date  . VENTRICULAR TACHYCARDIA   . PERIPHERAL VASCULAR DISEASE   . HYPERTENSION   . HYPERCHOLESTEROLEMIA   . CORONARY ARTERY  DISEASE   . Chronic systolic heart failure   . CAROTID ARTERY DISEASE   . SEBORRHEIC DERMATITIS   . RENAL INSUFFICIENCY   . DIVERTICULOSIS OF COLON   . CPK, ABNORMAL   . COPD   . COLONIC POLYPS   . CANDIDIASIS, ORAL   . ASTHMATIC BRONCHITIS, ACUTE   . ANXIETY   . ICD (implantable cardiac defibrillator) in place   . Myocardial infarction ?1990's    "silent" (05/24/2012)  . Chronic bronchitis     "every year" (05/24/2012)  . Type II diabetes mellitus   . Chronic lower back pain   . DJD (degenerative joint disease)     "hands" (05/24/2012)  . Osteoarthritis of finger     ROS:   All systems reviewed and negative except as noted in the HPI.   Past Surgical History  Procedure Laterality Date  . Coronary artery bypass graft  1990's    CABG X4  . Tonsillectomy  ?1942  . Cardiac defibrillator placement  12/2004    single chamber defibrillator- Medtronic Maxima 8/06 DrKlein [Other][     Family History  Problem Relation Age of Onset  . Heart attack Father   . Heart attack Brother      History   Social History  . Marital Status: Married    Spouse  Name: N/A    Number of Children: N/A  . Years of Education: N/A   Occupational History  . Not on file.   Social History Main Topics  . Smoking status: Former Smoker -- 2.00 packs/day for 50 years    Types: Cigarettes    Quit date: 06/24/2000  . Smokeless tobacco: Never Used  . Alcohol Use: Yes     Comment: 05/24/2012 "used to drink alot; quit > 10 yr ago"  . Drug Use: No  . Sexually Active: No   Other Topics Concern  . Not on file   Social History Narrative  . No narrative on file     BP 114/60  Pulse 65  Ht 5\' 7"  (1.702 m)  Wt 169 lb 12.8 oz (77.021 kg)  BMI 26.59 kg/m2  Physical Exam:  Well appearing 77 year old man,NAD HEENT: Unremarkable Neck:  7 cm JVD, no thyromegally Lungs:  Clear with no wheezes, rales, or rhonchi. HEART:  Regular rate rhythm, grade 2/6 systolic murmurs, no rubs, no  clicks Abd:  soft, positive bowel sounds, no organomegally, no rebound, no guarding Ext:  2 plus pulses, no edema, no cyanosis, no clubbing Skin:  No rashes no nodules Neuro:  CN II through XII intact, motor grossly intact  EKG - normal sinus rhythm with nonspecific intraventricular conduction delay, QRS duration 140 ms.  DEVICE  Normal device function.  See PaceArt for details.   Assess/Plan:

## 2012-09-13 ENCOUNTER — Telehealth: Payer: Self-pay | Admitting: Pulmonary Disease

## 2012-09-13 ENCOUNTER — Encounter: Payer: Self-pay | Admitting: *Deleted

## 2012-09-13 NOTE — Telephone Encounter (Signed)
I spoke with spouse and he stated he is now taking lasix 40 mg gving by cardiology. He stated he remembers SN wanted him at one time to back off this medication. He stated he has noticed he has been feeling more fatigue lately and is not sure if it is d/t this medication Per pt he does not need a call back tonight. Please advise SN thanks Last OV 04/2012 Pending none Allergies  Allergen Reactions  . Niacin Itching    Niaspan

## 2012-09-14 NOTE — Telephone Encounter (Signed)
Per SN: He does not see where he mentioned this in his notes where he advised pt of this. He needs to call his cardiologists regarding this.  I called and made spouse aware of this. Nothing further was needed

## 2012-09-30 ENCOUNTER — Other Ambulatory Visit: Payer: Self-pay | Admitting: Cardiology

## 2012-10-19 ENCOUNTER — Other Ambulatory Visit: Payer: Self-pay | Admitting: Cardiology

## 2012-11-15 ENCOUNTER — Ambulatory Visit (INDEPENDENT_AMBULATORY_CARE_PROVIDER_SITE_OTHER)
Admission: RE | Admit: 2012-11-15 | Discharge: 2012-11-15 | Disposition: A | Discharge status: Home / Self Care | Payer: Medicare Other | Source: Ambulatory Visit | Attending: Pulmonary Disease | Admitting: Pulmonary Disease

## 2012-11-15 ENCOUNTER — Encounter: Payer: Self-pay | Admitting: Gastroenterology

## 2012-11-15 ENCOUNTER — Ambulatory Visit (INDEPENDENT_AMBULATORY_CARE_PROVIDER_SITE_OTHER): Payer: Medicare Other | Admitting: Pulmonary Disease

## 2012-11-15 ENCOUNTER — Encounter: Payer: Self-pay | Admitting: Pulmonary Disease

## 2012-11-15 VITALS — BP 120/70 | HR 66 | Temp 97.9°F | Ht 66.5 in | Wt 171.0 lb

## 2012-11-15 DIAGNOSIS — I2510 Atherosclerotic heart disease of native coronary artery without angina pectoris: Secondary | ICD-10-CM

## 2012-11-15 DIAGNOSIS — E7800 Pure hypercholesterolemia, unspecified: Secondary | ICD-10-CM

## 2012-11-15 DIAGNOSIS — I10 Essential (primary) hypertension: Secondary | ICD-10-CM

## 2012-11-15 DIAGNOSIS — I5022 Chronic systolic (congestive) heart failure: Secondary | ICD-10-CM

## 2012-11-15 DIAGNOSIS — M1990 Unspecified osteoarthritis, unspecified site: Secondary | ICD-10-CM

## 2012-11-15 DIAGNOSIS — N259 Disorder resulting from impaired renal tubular function, unspecified: Secondary | ICD-10-CM

## 2012-11-15 DIAGNOSIS — Z95810 Presence of automatic (implantable) cardiac defibrillator: Secondary | ICD-10-CM

## 2012-11-15 DIAGNOSIS — E119 Type 2 diabetes mellitus without complications: Secondary | ICD-10-CM

## 2012-11-15 DIAGNOSIS — N320 Bladder-neck obstruction: Secondary | ICD-10-CM

## 2012-11-15 DIAGNOSIS — I739 Peripheral vascular disease, unspecified: Secondary | ICD-10-CM

## 2012-11-15 DIAGNOSIS — I472 Ventricular tachycardia: Secondary | ICD-10-CM

## 2012-11-15 DIAGNOSIS — F411 Generalized anxiety disorder: Secondary | ICD-10-CM

## 2012-11-15 DIAGNOSIS — M545 Low back pain: Secondary | ICD-10-CM

## 2012-11-15 DIAGNOSIS — J449 Chronic obstructive pulmonary disease, unspecified: Secondary | ICD-10-CM

## 2012-11-15 MED ORDER — FUROSEMIDE 20 MG PO TABS: 20.0000 mg | ORAL_TABLET | Freq: Two times a day (BID) | ORAL | Status: DC

## 2012-11-15 NOTE — Progress Notes (Signed)
Subjective:     Patient ID: [name] [name], male   DOB: March 31, 1936, 77 y.o.   MRN: [id number]  HPI 77 y/o WM here for a follow up visit... he has multiple medical problems as noted below...   ~  November 04, 2011:  28mo ROV & [name] states that he is doing fine- no new complaints or concerns, he didn't even bring his meds or med list for review...     He saw DrAllred 1/13> doing well w/o CP, palpit, SOB, orthop, PND, etc; ICD interrogated & functioning normally, no med changes made...    He saw DrHochrein 1/13> doing satis & relatively asymptomatic but not exercising; rec to continue aggressive risk reduction strategy; same meds...    We reviewed prob list, meds, xrays and labs> see below>> CXR 6/13 showed stable heart size s/p CABG, pacer, hyperinflated/ clear lungs w/ interstitial prominence, DJD in TSpine... LABS 2/13 by DrHochrein:  TChol 136, TG 105, HDL 31, LDL 84 LABS 6/13:  Chems- NW=29 A1c=7.0 BUN=21 Creat=1.4 K=5.5   ~  May 04, 2012:  99mo ROV & [name] notes some incr congestion & cough, he had resp exac 11/13 treated by TP w/ Augmentin & Pred- improved;  He is also c/o LBP x several weeks, pain is incr w/ movement, not radiating to his legs- he is rec to rest, apply heat, & try Tramadol & Robaxin Tid, we will also set up referral to Ortho- DrWainer...  We reviewed the following medical problems during today's office visit>>     AB, COPD> on Advair500Bid, Xopenex prn, Mucinex-2Bid, off Pred; recent exac treated w/ Augmentin & Pred- improved...    HBP> on Coreg12.5Bid, Vasotec5Bid, Lasix20; BP= 100/58 & he denies CP, palpit, ch in SOB/DOE, edema, etc...    CAD> on FAO130; Hx Lmain + 3vessel dis & CABGx4 in 2003; f/u cath 2008 w/ atretic LIMA to LAD;     CHF, Ischemic Cardiomyop> EF= 35-40% by 2DEcho 2/13; he continues regular f/u w/ DrHochrein...    Hx VTach, AICD> on Mexiletine150Bid; he is followed by DrKlein/Allred & stable on Mexiletine w/o AICD firing; new AICD generator needed  soon...    ASPVDz> on ASA325; Hx PTA to right SFA for ABI=0.36 in past; not active enough for claud; known plaque in Carotids w/o signif flow reduction...    CHOL> on Prav80, Feno160; last FLP 2/13 showed TChol 136, TG 105, HDL 31, LDL 84    DM> on Glucovance2.5-500; labs today showed BS=134, A1c=7.5 and asked to incr Glucov to an extra 1/2 tab in PM...    GI- Divertics, Polyps> hx adenomatous polyp in 1997, last colonoscopy 2002 by [facility] w/ divertics only; he is overdue for f/u colon but declines to sched f/u appt...    Renal Insuffic> baseline Creat= 1.4-1.5; labs today reveals BUN=27, Creat=1,8, K=5.2; he is rec to decr the Lasix20 to just PRN for edema...    DJD> followed by DrWainer w/ shots in shoulders in the past; now c/o LBP & we will Rx w/ rest, heat, Tramadol, Robaxin & refer to DrWainer...    Anxiety> aware- he is not on an anxiolytic med by his pref... We reviewed prob list, meds, xrays and labs> see below for updates >>  LABS 12/13 (not fasting):  Chems- BS=158, A1c=7.5, BUN=27, Creat=1.8, K=5.2    ~  November 15, 2012:  99mo ROV & [name] is c/o constipation, plus he is long overdue for f/u colon w/ hx adenomatous polyps; we decided to check KUB XRay=> air &  stool scat about the colon but stool burden NOT substantially increased; we rec MIRALAX daily  & refer to GI as noted...  We reviewed the following medical problems during today's office visit >>     AB, COPD> on Advair500Bid, Xopenex prn, Mucinex-2Bid, off Pred; prev exac treated w/ Augmentin & Pred- improved; now stable w/ min cough, stable DOE etc...    HBP> on Coreg12.5Bid, Vasotec5Bid, Lasix40; BP= 120/70 & he feels the Lasix is too strong- labs show BUN=35 Cr=1.6 therefore rec decr to 1/2 tab daily (20mg )...     CAD> on ASA325; Hx Lmain + 3vessel dis & CABGx4 in 2003; f/u cath 2008 w/ atretic LIMA to LAD; he denies CP, palpit, dizzy, ch in SOB, edema...    CHF, Ischemic Cardiomyop> EF= 35-40% by 2DEcho 2/13; he continues regular  f/u w/ DrHochrein...    Hx VTach, AICD> on Mexiletine150Bid; he is followed by DrKlein/Taylor & stable on Mexiletine- generator changed 12/13, seen 3/14 & stable, they incr Lasix to 40 but he feels it's too strong.    ASPVDz> on ASA325; Hx PTA to right SFA for ABI=0.36 in past; not active enough for claud; known plaque in Carotids w/o signif flow reduction...    CHOL> on Prav80, Feno160; FLP 6/14 showed TChol 69, TG 169, HDL 15, LDL 21; rec to continue meds better low fat diet, incr exerc...    DM> on Glucovance2.5-500 (oneQam & 1/2Qpm); labs today showed BS=111, A1c=6.9 and asked to get on better low carb diet!    GI- Divertics, Polyps> hx adenomatous polyp in 1997, last colonoscopy 2002 by [facility] w/ divertics only; he is overdue for f/u colon & c/o constip=> KUB w/o impaction, start Miralax, refer to GI...    Renal Insuffic> baseline Creat= 1.4-1.5; labs today reveals BUN=35, Creat=1,6, K=5.6; he is rec to decr the Lasix40 back to 20mg /d...    DJD> followed by DrWainer w/ shots in shoulders in the past; now c/o LBP & we will Rx w/ rest, heat, Tramadol, Robaxin & refer to DrWainer...    Anxiety> aware- he is not on an anxiolytic med by his pref... We reviewed prob list, meds, xrays and labs> see below for updates >>  KUB XRay 6/14 showed air & stool scat about the colon but stool burden NOT substantially increased, DJD in TSpine...  LABS 6/14:  FLP- TChol&LDL are ok but HDL=15, TG=169;  Chems- showed BS=111 A1c=6.9, K=5.6 BUN=35 Cr=1.6;  CBC- ok;  TSH=2.10;  PSA=0.45...          Problem List:  ASTHMATIC BRONCHITIS, ACUTE (ICD-466.0) - occas acute infectious exacerbations... ~  12/10: had bronchitic exac Rx'd w/ Avelox, Pred, Mucinex, etc & improved==> CXR showed COPD/E, incr interstitial markings & scarring, calcif pleural plaques, enlarged heart & pacer, NAD.Marland Kitchen.  ~  7/11:  similar episode, responded to similar meds... ~  11/13: similar resp exac treated by TP w/ Augmentin & Pred-  improved...  COPD (ICD-496) - ex-smoker, quit 2002... he clearly does better on regular dosing of ADVAIR 500Bid, MUCINEX 1200mg Bid, XOPENEX Prn... urged to continue these regularly and get more regular exercise... ~  CXR 6/13 showed stable heart size s/p CABG, pacer, hyperinflated/ clear lungs w/ interstitial prominence, DJD in TSpine... ~  CXR 11/13 showed cardiomeg & prev CABG, left subclav AICD, atherosclerotic calcif of Ao, calcif pleural plaques, emphysema w/o acute changes...  HYPERTENSION (ICD-401.9) - meds for BP & his Chr CHF= COREG 12.5mg Bid,  ENALAPRIL 5mg Bid, & LASIX 20mg /d...   ~  labs 1/10 showed  K= 5.6, BUN= 31, Creat= 1.9..Marland Kitchen ACE/ ARB stopped... pt didn't follow up. ~  8/10: Coreg + Enalapril restarted by DrHochrein... ~  labs 9/10 showed K=5.6, BUN=19, Creat=1.5 ~  labs 12/10 showed K=4.4, BUN= 32, Creat= 1.7, BNP= 102 ~  labs 4/11 showed K=5.1, BUN=16, Creat=1.4 ~  10/11: BP=122/68 today, and feeling well- he denies HA, fatigue, visual changes, CP, palipit, dizziness, syncope, edema, etc. ~  labs 2/13 showed K=5.7, BUN=27, Creat=1.5 ~  6/13: BP= 112/62 and he denies CP, palpit, dizzy, syncope, ch in SOB, edema... ~  Labs 6/13 showed K=5.5, BUN=21, Creat=1.4 ~  12/13: on Coreg12.5Bid, Vasotec5Bid, Lasix20; BP= 100/58 & he denies CP, palpit, ch in SOB/DOE, edema, etc; we cut the LASIX to 20mg  prn swelling due to Creat=1.8 ~  6/14: on Coreg12.5Bid, Vasotec5Bid, Lasix40; BP= 120/70 & he feels the Lasix is too strong- labs show BUN=35 Cr=1.6 therefore rec decr to 1/2 tab daily (20mg ).  CORONARY ARTERY DISEASE (ICD-414.00) - no chest pains, no palpit, no defib discharges... ~ Hx 3 vessel CAD w/ CABG x4 in 2/03 for LMain dis & 3vessel dis + mod LVD EF=35% at that time... ~ Cath 7/06 w/ non-filling of SVG to PDA, & LIMA to LAD was atretic, w/ EF=45%... ~ Cath 10/08 w/ norm SVG to PDA, & atretic LIMA to LAD (due to good native vessel flow), EF=40%... note is made of a 40-50% LMain  lesion... ~  EKG 1/13 showed SBrady, rightward axis, IVCD, NSSTTWA...  CONGESTIVE HEART FAILURE, SYSTOLIC, CHRONIC (ICD-428.0) - ischemic cardiomyopathy & meds as above... followed by DrHochrein in the CHF clinic...  see labs above>> ~  seen 8/10 in CHF clinic- Coreg + Enalapril restarted, labs reviewed. ~  2DEcho 2/13 showed LV mildly dilated w/ diffuse HK, incr wall thickness c/w mild LVH, EF=35-40%, mild MR, mild LAdil... ~  Labs 6/13 showed Creat=1.4 & BNP=83 ~  Labs 12/13 showed Creat=1.8 and his Lasix20 was cut to just prn for swelling... ~  3/14: DrTaylor increased his Lasix to 40mg /d; pt feels this is too strong for him... ~  6/14: Labs showed K=5.6 BUN=35 Cr=1.6 and he is asked to decr the Lasix back to 1/2 tab daily (20mg )...  Hx of VENTRICULAR TACHYCARDIA (ICD-427.1) - prev documented medication non-compliance w/ Amio & Mexilitene... DrHochrein has adjusted his meds... ~ hx VTach storm w/ AICD placed, followed by DrTaylor/Klein on meds... ~ DC'd 10/08 on Amiodarone 200mg - 2 tabs twice daily, and MEXILETINE 150mg Bid... subseq decr Amio 200mg Bid... ~ f/u w/ DrKlein 9/09 w/ defib check- doing well... ~  f/u 9/10 w/ DrKlein on Mexilitene alone, Amio stopped for intolerance... doing well- no changes made. ~  f/u 1/13 w/ DrAllred> stable on Mexilitene, & no changes made... ~  12/13: DrTaylor fixed one lead & changed the AICD generator, he remains stable on the Mexilitene...  PERIPHERAL VASCULAR DISEASE (ICD-443.9) >> on ASA 325mg /d... ~ s/p PTA to right SFA for an ABI of 0.36...  ~  CDopplers 10/09 showed bilat carotid disease w/ bruits and mod plaque in bulbs, 0-39% bilat... no change. ~  CDopplers 11/11 showed mod heterogeneous plaque in bulbs, 0-39% bilat ICA stenoses, f/u 71yrs...  HYPERCHOLESTEROLEMIA (ICD-272.0) - prev on Simvastatin 40mg /d, and changed to PRAVASTATIN 80mg /d per DrHochrein (due to interaction of Simva & Amio)... FENOFIBRATE 160mg /d added 8/09... ~  FLP 8/08 on  Simva40 showed TChol 136, TG 297, HDL 22, LDL 69- but he was ?not taking meds regularly? he freq forgets. ~  FLP 8/09 on Prav80 showed  TChol 124, TG 257, HDL 24, LDL 65... rec to add Fenofibrate 160/d... ~  FLP 9/10 on Prav80+Feno160 showed TChol 137, TG 142, HDL 26, LDL 83... continue both meds. ~  FLP 4/11 on Prav80+Feno160 showed TChol 131, TG 139, HDL 33, LDL 70 ~  FLP 10/11 = pt didn't go to the lab for blood work... ~  FLP 2/13 on Prav80+Feno160 showed TChol 136, TG 105, HDL 31, LDL 84 ~  FLP 6/14 on Prav80+Feno160 showed TChol 69, TG 169, HDL 15, LDL 21  DIABETES MELLITUS (ICD-250.00) - on GLUCOVANCE 2.5/500- 1/2 Bid... he states his home BS checks have all been around 100 w/ occas nocturnal hypoglycemia> advised to take PM dose at dinner, not bedtime. ~   prev BS=177, HgA1c=7.0 in Oct08... ~  labs 2/09 showed BS=83, HgA1c=6.8 ~  labs 8/09 showed BS= 118, HgA1c= 7.3 ~  labs 9/10 showed BS= 90, A1c= 7.2 ~  labs 4/11 showed BS= 65, A1c= 6.9 ~  labs 2/13 showed BS= 76-171 ~  Labs 6/13 showed BS= 67, A1c=7.0 ~  on Glucovance2.5-500; labs today showed BS=134, A1c=7.5 and asked to incr Glucov to an extra 1/2 tab in PM... ~  on Glucovance2.5-500 (oneQam & 1/2Qpm); labs today showed BS=111, A1c=6.9 and asked to get on better low carb diet!  DIVERTICULOSIS OF COLON (ICD-562.10) - last colonoscopy was 10/02 by [facility] showing divertics only... prev polypectomy 1997 for tubular adenoma... overdue for f/u exam... COLONIC POLYPS (ICD-211.3)  RENAL INSUFFICIENCY (ICD-588.9) - Creat in the 1.6-1.7 range... ~  labs 8/09 showed BUN= 14, Creat= 1.2 ~  labs 1/10 showed BUN= 31, Creat= 1.9 & ACE was held... f/u improved. ~  labs 9/10 showed BUN= 19, Creat= 1.5 ~  labs 4/11 showed BUN= 16, Creat= 1.4 ~  Labs 2/13 showed BUN= 27, Creat= 1.5 ~  Labs 6/13 showed BUN= 21, Creat= 1.4 ~  baseline Creat= 1.4-1.5; labs today reveals BUN=27 Creat=1,8 K=5.2; he is rec to decr the Lasix20 to just PRN for  edema... ~  3/14:  DrTaylor increased the Lasix to 40mg /d... ~  6/14:  Labs showed K=5.6 BUN=35 Cr=1.6 and he is asked to decr the Lasix back to 1/2 tab daily (20mg )...  DEGENERATIVE JOINT DISEASE (ICD-715.90) ~  2/12:  Seen by DrWainer w/ bilat shoulder pain; XRays showed glenohumeral degen changes; given bilat injections w/ relief... ~  12/13:  He is c/o LBP> we rec refewrral to Ortho for further eval; Rx w/ rest, heat, TRAMADOL, ROBAXIN...  ANXIETY (ICD-300.00)  SEBORRHEIC DERMATITIS (ICD-690.10) - he has mod seb derm- discussed Rx w/ Selsun/ T-Gel/ Lotrisone Cream Prn...   Past Surgical History  Procedure Laterality Date  . Coronary artery bypass graft  1990's    CABG X4  . Tonsillectomy  ?1942  . Cardiac defibrillator placement  12/2004    single chamber defibrillator- Med[employer]onic Maxima 8/06 DrKlein [Other][    Outpatient Encounter Prescriptions as of 11/15/2012  Medication Sig Dispense Refill  . ADVAIR DISKUS 500-50 MCG/DOSE AEPB INHALE ONE DOSE BY MOUTH TWICE DAILY  60 each  5  . aspirin EC 325 MG tablet Take 325 mg by mouth every evening.      . carvedilol (COREG) 12.5 MG tablet Take 1 tablet (12.5 mg total) by mouth 2 (two) times daily with a meal.  180 tablet  2  . enalapril (VASOTEC) 5 MG tablet Take 1 tablet (5 mg total) by mouth 2 (two) times daily.  180 tablet  2  . fenofibrate 160 MG tablet  TAKE ONE TABLET BY MOUTH ONCE DAILY.   90 tablet  0  . glyBURIDE-metformin (GLUCOVANCE) 2.5-500 MG per tablet 1 tablet am, 1/2 tablet pm      . Guaifenesin (MUCINEX MAXIMUM STRENGTH) 1200 MG TB12 Take 1 tablet by mouth 2 (two) times daily.      Marland Kitchen levalbuterol (XOPENEX HFA) 45 MCG/ACT inhaler Inhale 1-2 puffs into the lungs every 4 (four) hours as needed for wheezing or shortness of breath.  1 Inhaler  6  . mexiletine (MEXITIL) 150 MG capsule TAKE ONE CAPSULE BY MOUTH TWICE DAILY  60 capsule  0  . pravastatin (PRAVACHOL) 80 MG tablet TAKE ONE TABLET BY MOUTH ONCE DAILY.  90 tablet  0   . furosemide (LASIX) 40 MG tablet Take 1 tablet (40 mg total) by mouth daily.  90 tablet  3   No facility-administered encounter medications on file as of 11/15/2012.    Allergies  Allergen Reactions  . Niacin Itching    Niaspan     Current Medications, Allergies, Past Medical History, Past Surgical History, Family History, and Social History were reviewed in [employer] record.   Review of Systems        See HPI - all other systems neg except as noted...       The patient complains of dyspnea on exertion.  The patient denies anorexia, fever, weight loss, weight gain, vision loss, decreased hearing, hoarseness, chest pain, syncope, peripheral edema, prolonged cough, headaches, hemoptysis, abdominal pain, melena, hematochezia, severe indigestion/heartburn, hematuria, incontinence, muscle weakness, suspicious skin lesions, transient blindness, difficulty walking, depression, unusual weight change, abnormal bleeding, enlarged lymph nodes, and angioedema.     Objective:   Physical Exam    WD, WN, 77 y/o WM in NAD... GENERAL:  Alert & oriented; pleasant & cooperative... HEENT:  [place]/AT, EOM-wnl, PERRLA, EACs-clear, TMs-wnl, NOSE-clear, THROAT-clear & wnl. NECK:  Supple w/ fairROM; no JVD; normal carotid impulses w/o bruits; no thyromegaly or nodules palpated; no lymphadenopathy. CHEST:  Few scat rhonchi without wheezing, rales, or signs of consolid... HEART:  Regular Rhythm;  gr1/6 SEM, without rubs or gallops heard... ABDOMEN:  Soft & nontender; normal bowel sounds; no organomegaly or masses detected. EXT: without deformities, mild arthritic changes; no varicose veins/ venous insuffic/ or edema. NEURO:  CN's intact; motor testing normal; sensory testing normal; gait normal & balance OK. DERM:  No lesions noted; no rash etc...  RADIOLOGY DATA:  Reviewed in the EPIC EMR & discussed w/ the patient...  LABORATORY DATA:  Reviewed in the EPIC EMR & discussed w/ the  patient...    Assessment:      COPD/ AB>  Stable on Advair, Xopenex, Mucinex; asked to take meds regularly & incr exercise...  HBP>  Controlled on meds, continue same, asked to monitor at home...  CAD>  S/p CABG, he is too sedentary & asked to incr exercise...  CHF, Cardiomyopathy>  Followed by DrHochrein; continue meds...  Hx VTach>  Stable on Mexilitene & has AICD monitored by DrKlein/Taylor... Lead changed & new generator placed 12/13...  ASPVD>  He is s/p right SFA PTA & doing satis, but needs to incr exercise & continue ASA...  CHOL>  On Prav80 + Feno160; last FLP by DrHochrein 2/13 looked good x lowHDL needs incr exercise...  DM>  On diet + Glucovance; A1c is improved on 1.5 tabs per day...  Renal Insuffic>  Creat= 1.4 at baseline & 1.6 currently, we are decr the Lasix back to 20mg /d which is what the  pt wants...  DJD>  Aware, stable on OTC meds...  Anxiety>  Stable & not on anxiolytic Rx...     Plan:     Patient's Medications  New Prescriptions   FUROSEMIDE (LASIX) 20 MG TABLET    Take 1 tablet (20 mg total) by mouth daily.   HYDROCORTISONE (ANUSOL-HC) 2.5 % RECTAL CREAM    Apply after each BM and QHS  Previous Medications   ADVAIR DISKUS 500-50 MCG/DOSE AEPB    INHALE ONE DOSE BY MOUTH TWICE DAILY   ASPIRIN EC 325 MG TABLET    Take 325 mg by mouth every evening.   CARVEDILOL (COREG) 12.5 MG TABLET    Take 1 tablet (12.5 mg total) by mouth 2 (two) times daily with a meal.   ENALAPRIL (VASOTEC) 5 MG TABLET    Take 1 tablet (5 mg total) by mouth 2 (two) times daily.   FENOFIBRATE 160 MG TABLET    TAKE ONE TABLET BY MOUTH ONCE DAILY.    GLYBURIDE-METFORMIN (GLUCOVANCE) 2.5-500 MG PER TABLET    1 tablet am, 1/2 tablet pm   GUAIFENESIN (MUCINEX MAXIMUM STRENGTH) 1200 MG TB12    Take 1 tablet by mouth 2 (two) times daily.   LEVALBUTEROL (XOPENEX HFA) 45 MCG/ACT INHALER    Inhale 1-2 puffs into the lungs every 4 (four) hours as needed for wheezing or shortness of breath.    MEXILETINE (MEXITIL) 150 MG CAPSULE    TAKE ONE CAPSULE BY MOUTH TWICE DAILY   NIACIN 500 MG TABLET    Take 500 mg by mouth daily with breakfast.   PRAVASTATIN (PRAVACHOL) 80 MG TABLET    TAKE ONE TABLET BY MOUTH ONCE DAILY.    Modified Medications   No medications on file  Discontinued Medications   FUROSEMIDE (LASIX) 20 MG TABLET    Take 20 mg by mouth daily.   FUROSEMIDE (LASIX) 40 MG TABLET    Take 1 tablet (40 mg total) by mouth daily.

## 2012-11-15 NOTE — Patient Instructions (Addendum)
Today we updated your med list in our EPIC system...    We decided to decr the Lasix back to 20mg /d (each AM)...  Today we did a Flat plate of your Abd to check for resid stool impaction... Please return to our lab in the AM for your FASTING blood work...    We will contact you w/ the results when available...   We will refer your chart to our GI Dept regarding your follow up colonoscopy...  We wrote for a new diabetic meter & supplies...  REMEMBER>> NO SALT!!!  Call for any questions...  Let's plan a follow up visit in 8mo, sooner if needed for problems.Marland KitchenMarland Kitchen

## 2012-11-16 ENCOUNTER — Other Ambulatory Visit (INDEPENDENT_AMBULATORY_CARE_PROVIDER_SITE_OTHER): Payer: Medicare Other

## 2012-11-16 ENCOUNTER — Telehealth: Payer: Self-pay | Admitting: Pulmonary Disease

## 2012-11-16 DIAGNOSIS — I10 Essential (primary) hypertension: Secondary | ICD-10-CM

## 2012-11-16 DIAGNOSIS — I5022 Chronic systolic (congestive) heart failure: Secondary | ICD-10-CM

## 2012-11-16 DIAGNOSIS — E7800 Pure hypercholesterolemia, unspecified: Secondary | ICD-10-CM

## 2012-11-16 DIAGNOSIS — E119 Type 2 diabetes mellitus without complications: Secondary | ICD-10-CM

## 2012-11-16 DIAGNOSIS — N320 Bladder-neck obstruction: Secondary | ICD-10-CM

## 2012-11-16 DIAGNOSIS — F411 Generalized anxiety disorder: Secondary | ICD-10-CM

## 2012-11-16 LAB — CBC WITH DIFFERENTIAL/PLATELET
Basophils Absolute: 0.1 K/uL (ref 0.0–0.1)
Basophils Relative: 0.7 % (ref 0.0–3.0)
Eosinophils Absolute: 0.3 K/uL (ref 0.0–0.7)
Eosinophils Relative: 3.1 % (ref 0.0–5.0)
HCT: 39.6 % (ref 39.0–52.0)
Hemoglobin: 13.4 g/dL (ref 13.0–17.0)
Lymphocytes Relative: 22.8 % (ref 12.0–46.0)
Lymphs Abs: 2 K/uL (ref 0.7–4.0)
MCHC: 34 g/dL (ref 30.0–36.0)
MCV: 88.5 fl (ref 78.0–100.0)
Monocytes Absolute: 1.3 K/uL — ABNORMAL HIGH (ref 0.1–1.0)
Monocytes Relative: 15.2 % — ABNORMAL HIGH (ref 3.0–12.0)
Neutro Abs: 5.1 K/uL (ref 1.4–7.7)
Neutrophils Relative %: 58.2 % (ref 43.0–77.0)
Platelets: 124 K/uL — ABNORMAL LOW (ref 150.0–400.0)
RBC: 4.47 Mil/uL (ref 4.22–5.81)
RDW: 15.5 % — ABNORMAL HIGH (ref 11.5–14.6)
WBC: 8.8 K/uL (ref 4.5–10.5)

## 2012-11-16 LAB — BASIC METABOLIC PANEL
CO2: 30 mEq/L (ref 19–32)
Chloride: 101 mEq/L (ref 96–112)
GFR: 44.02 mL/min — ABNORMAL LOW (ref >60.00)
Glucose, Bld: 111 mg/dL — ABNORMAL HIGH (ref 70–99)
Potassium: 5.6 mEq/L — ABNORMAL HIGH (ref 3.5–5.1)
Sodium: 139 mEq/L (ref 135–145)

## 2012-11-16 LAB — HEPATIC FUNCTION PANEL
ALT: 48 U/L (ref 0–53)
AST: 89 U/L — ABNORMAL HIGH (ref 0–37)
Albumin: 3.5 g/dL (ref 3.5–5.2)
Alkaline Phosphatase: 63 U/L (ref 39–117)

## 2012-11-16 LAB — LIPID PANEL
Cholesterol: 69 mg/dL (ref 0–200)
VLDL: 33.8 mg/dL (ref 0.0–40.0)

## 2012-11-16 LAB — PSA: PSA: 0.45 ng/mL (ref 0.10–4.00)

## 2012-11-16 LAB — TSH: TSH: 2.1 u[IU]/mL (ref 0.35–5.50)

## 2012-11-16 MED ORDER — HYDROCORTISONE 2.5 % RE CREA: TOPICAL_CREAM | RECTAL | Status: DC

## 2012-11-16 NOTE — Telephone Encounter (Signed)
Per SN---   SN does not use that.   rx for anusol hc cream  Apply after each BM and at bedtime.   1 tube with prn refills. thanks

## 2012-11-16 NOTE — Telephone Encounter (Signed)
Spoke with pt. He is aware of SN recommendations. Rx will be sent to [facility] pharmacy per the pt's request. Nothing further was needed.

## 2012-11-16 NOTE — Telephone Encounter (Signed)
Notes Recorded by Michele Mcalpine, MD on 11/16/2012 at 8:16 AM Please notify patient>  Abd film w/ air & stool throughout the colon but stool burden is normal (not increased); Rec to use Miralax/ Senakot-S regularly to keep him moving... ---  Spouse aware of results. Spouse is also requesting RX for hemorrhoids. Pt has not tried anything OTC. She stated pt friends advised there was something you can put in bath water that helps with this but you need an rx (does not know the name of it). Please advise SN thanks Last OV 11/15/12  Allergies  Allergen Reactions  . Niacin Itching    Niaspan

## 2012-11-17 MED ORDER — FUROSEMIDE 20 MG PO TABS: 20.0000 mg | ORAL_TABLET | Freq: Every day | ORAL | Status: DC

## 2012-11-22 ENCOUNTER — Encounter: Payer: Medicare Other | Admitting: *Deleted

## 2012-11-22 ENCOUNTER — Telehealth: Payer: Self-pay | Admitting: Pulmonary Disease

## 2012-11-22 DIAGNOSIS — K5730 Diverticulosis of large intestine without perforation or abscess without bleeding: Secondary | ICD-10-CM

## 2012-11-22 DIAGNOSIS — R197 Diarrhea, unspecified: Secondary | ICD-10-CM

## 2012-11-22 NOTE — Telephone Encounter (Signed)
I spoke with pt. He stated he has been having watery clear stools x 10 days . He stated this usually happens int he mornings. He feels he may have a blockage "somewhere up in him" and he feels like the stools he has "floats past this" and he has uncontrollable loose stools. He does not know when this will happen it just happens. He stated he has done everything SN recs. He is wanting a referral to specialists. Per spouse maybe a Education administrator. Please advise thanks Last OV 11/15/12 05/09/13 pending   Allergies  Allergen Reactions  . Niacin Itching    Niaspan

## 2012-11-22 NOTE — Telephone Encounter (Signed)
lmomtcb x1 

## 2012-11-22 NOTE — Telephone Encounter (Signed)
Per SN---   Refer to his GI doctor---ok to see PA ASAP for eval.  thanks

## 2012-11-23 NOTE — Telephone Encounter (Signed)
Called spoke with patient and advised him of SN's recs as stated below.  Pt okay with these recommendations and verbalized his understanding.  Pt did report that he was continuing to take his miralax and senokot-s daily as recommended and did stop this yesterday (last dose 6.29.14).  Pt stated that he has diarrhea x2 this morning and this is less than yesterday.  Advised pt to continue to hold this until diarrhea subsides or he receives recs from GI.  Order placed for ASAP GI referral.

## 2012-11-29 ENCOUNTER — Other Ambulatory Visit: Payer: Self-pay | Admitting: Gastroenterology

## 2012-11-29 DIAGNOSIS — I2510 Atherosclerotic heart disease of native coronary artery without angina pectoris: Secondary | ICD-10-CM

## 2012-11-29 DIAGNOSIS — K5900 Constipation, unspecified: Secondary | ICD-10-CM

## 2012-11-29 DIAGNOSIS — J449 Chronic obstructive pulmonary disease, unspecified: Secondary | ICD-10-CM

## 2012-11-29 DIAGNOSIS — Z86010 Personal history of colonic polyps: Secondary | ICD-10-CM

## 2012-12-01 ENCOUNTER — Telehealth: Payer: Self-pay | Admitting: Pulmonary Disease

## 2012-12-01 ENCOUNTER — Encounter: Payer: Self-pay | Admitting: *Deleted

## 2012-12-01 NOTE — Telephone Encounter (Signed)
Per SN---  Kinnie Scales is the specialist.  If he doesn't have other suggestions, then he will need further eval in the ER.  thanks

## 2012-12-01 NOTE — Telephone Encounter (Signed)
Pt is now unable to control diarrhea at all.  He seen Dr Kinnie Scales last week but all he did was scheduled pt for a colonoscopy next Tuesday and he doesn't think he will even be strong enough to have this done.  He is having diff eating also and is extremely weak.  He states that he feels so bad he may be willing to go to hospital.  Pt hasnt taken the Miralax or Senokot in several days now.  Please advise.

## 2012-12-01 NOTE — Telephone Encounter (Signed)
I spoke with pt and is aware. Nothing further was needed 

## 2012-12-07 ENCOUNTER — Ambulatory Visit
Admission: RE | Admit: 2012-12-07 | Discharge: 2012-12-07 | Disposition: A | Discharge status: Home / Self Care | Payer: Medicare Other | Source: Ambulatory Visit | Attending: Gastroenterology | Admitting: Gastroenterology

## 2012-12-07 DIAGNOSIS — K5900 Constipation, unspecified: Secondary | ICD-10-CM

## 2012-12-07 DIAGNOSIS — J449 Chronic obstructive pulmonary disease, unspecified: Secondary | ICD-10-CM

## 2012-12-07 DIAGNOSIS — I2510 Atherosclerotic heart disease of native coronary artery without angina pectoris: Secondary | ICD-10-CM

## 2012-12-07 DIAGNOSIS — Z86010 Personal history of colonic polyps: Secondary | ICD-10-CM

## 2012-12-09 ENCOUNTER — Encounter: Payer: Self-pay | Admitting: Cardiology

## 2012-12-09 ENCOUNTER — Ambulatory Visit (INDEPENDENT_AMBULATORY_CARE_PROVIDER_SITE_OTHER): Payer: Medicare Other | Admitting: Cardiology

## 2012-12-09 VITALS — BP 79/48 | HR 75 | Ht 67.0 in | Wt 162.2 lb

## 2012-12-09 DIAGNOSIS — I2510 Atherosclerotic heart disease of native coronary artery without angina pectoris: Secondary | ICD-10-CM

## 2012-12-09 DIAGNOSIS — C189 Malignant neoplasm of colon, unspecified: Secondary | ICD-10-CM

## 2012-12-09 DIAGNOSIS — E875 Hyperkalemia: Secondary | ICD-10-CM

## 2012-12-09 LAB — COMPREHENSIVE METABOLIC PANEL
BUN: 21 mg/dL (ref 6–23)
CO2: 31 mEq/L (ref 19–32)
Calcium: 8.7 mg/dL (ref 8.4–10.5)
Chloride: 96 mEq/L (ref 96–112)
Creatinine, Ser: 1.7 mg/dL — ABNORMAL HIGH (ref 0.4–1.5)
GFR: 41.93 mL/min — ABNORMAL LOW (ref >60.00)

## 2012-12-09 LAB — CBC
Platelets: 155 10*3/uL (ref 150.0–400.0)
RBC: 3.91 Mil/uL — ABNORMAL LOW (ref 4.22–5.81)

## 2012-12-09 MED ORDER — CARVEDILOL 6.25 MG PO TABS: 6.2500 mg | ORAL_TABLET | Freq: Two times a day (BID) | ORAL | Status: DC

## 2012-12-09 MED ORDER — ENALAPRIL MALEATE 2.5 MG PO TABS: 2.5000 mg | ORAL_TABLET | Freq: Two times a day (BID) | ORAL | Status: DC

## 2012-12-09 MED ORDER — FUROSEMIDE 20 MG PO TABS: 20.0000 mg | ORAL_TABLET | ORAL | Status: DC | PRN

## 2012-12-09 NOTE — Progress Notes (Signed)
HPI The patient presents for followup of his known coronary disease and cardiomyopathy.  Today he comes in complaining of dizziness and weakness. He had severe constipation which he treated and he subsequently developed diarrhea.  He is being evaluated by GI and had virtual colonoscopy 2 days ago. During this time he's lost a significant amount of weight. Today his blood pressure is very low and he is orthostatic as recorded below. He's not having any new shortness of breath though he has some chronic dyspnea. He's not having any chest pressure, neck or arm discomfort. He's not having any palpitations though is having dizziness without presyncope or syncope. He has some chronic lower extremity edema. He has not had any nausea vomiting.  Allergies  Allergen Reactions  . Niacin Itching    Niaspan     Current Outpatient Prescriptions  Medication Sig Dispense Refill  . ADVAIR DISKUS 500-50 MCG/DOSE AEPB INHALE ONE DOSE BY MOUTH TWICE DAILY  60 each  5  . aspirin EC 325 MG tablet Take 325 mg by mouth every evening.      . carvedilol (COREG) 12.5 MG tablet Take 1 tablet (12.5 mg total) by mouth 2 (two) times daily with a meal.  180 tablet  2  . enalapril (VASOTEC) 5 MG tablet Take 1 tablet (5 mg total) by mouth 2 (two) times daily.  180 tablet  2  . fenofibrate 160 MG tablet TAKE ONE TABLET BY MOUTH ONCE DAILY. ** PT MUST KEEP UPCOMING APPOINTMENT FOR FURTHER REFILLS.**  90 tablet  0  . furosemide (LASIX) 20 MG tablet Take 1 tablet (20 mg total) by mouth daily.  30 tablet  11  . glyBURIDE-metformin (GLUCOVANCE) 2.5-500 MG per tablet 1 tablet am, 1/2 tablet pm      . Guaifenesin (MUCINEX MAXIMUM STRENGTH) 1200 MG TB12 Take 1 tablet by mouth 2 (two) times daily.      . hydrocortisone (ANUSOL-HC) 2.5 % rectal cream Apply after each BM and QHS  30 g  prn  . levalbuterol (XOPENEX HFA) 45 MCG/ACT inhaler Inhale 1-2 puffs into the lungs every 4 (four) hours as needed for wheezing or shortness of breath.   1 Inhaler  6  . mexiletine (MEXITIL) 150 MG capsule TAKE ONE CAPSULE BY MOUTH TWICE DAILY  60 capsule  0  . pravastatin (PRAVACHOL) 80 MG tablet   90 tablet  0   No current facility-administered medications for this visit.    Past Medical History  Diagnosis Date  . VENTRICULAR TACHYCARDIA   . PERIPHERAL VASCULAR DISEASE   . HYPERTENSION   . HYPERCHOLESTEROLEMIA   . CORONARY ARTERY DISEASE   . Chronic systolic heart failure   . CAROTID ARTERY DISEASE   . SEBORRHEIC DERMATITIS   . RENAL INSUFFICIENCY   . DIVERTICULOSIS OF COLON   . CPK, ABNORMAL   . COPD   . COLONIC POLYPS   . CANDIDIASIS, ORAL   . ASTHMATIC BRONCHITIS, ACUTE   . ANXIETY   . ICD (implantable cardiac defibrillator) in place   . Myocardial infarction ?1990's    "silent" (05/24/2012)  . Chronic bronchitis     "every year" (05/24/2012)  . Type II diabetes mellitus   . Chronic lower back pain   . DJD (degenerative joint disease)     "hands" (05/24/2012)  . Osteoarthritis of finger     Past Surgical History  Procedure Laterality Date  . Coronary artery bypass graft  1990's    CABG X4  . Tonsillectomy  ?1942  .  Cardiac defibrillator placement  12/2004    single chamber defibrillator- Med[employer]onic Maxima 8/06 DrKlein [Other][    ROS:  Back and neck pain.  Otherwise stated in the HPI and negative for all other systems.  PHYSICAL EXAM BP 86/46  Pulse 65  Ht 5\' 7"  (1.702 m)  Wt 162 lb 3.2 oz (73.573 kg)  BMI 25.4 kg/m2 GENERAL:  Well appearing HEENT:  Pupils equal round and reactive, fundi not visualized, oral mucosa unremarkable NECK:  No jugular venous distention, waveform within normal limits, carotid upstroke brisk and symmetric, no bruits, no thyromegaly LYMPHATICS:  No cervical, inguinal adenopathy LUNGS:  Clear to auscultation bilaterally BACK:  No CVA tenderness CHEST:  ICD intact, well healed sternotomy scar. HEART:  PMI not displaced or sustained,S1 and S2 within normal limits, no S3, no S4, no  clicks, no rubs, no murmurs ABD:  Flat, positive bowel sounds normal in frequency in pitch, no bruits, no rebound, no guarding, no midline pulsatile mass, no hepatomegaly, no splenomegaly EXT:  2 plus pulses throughout, mild bilateral ankle edema, diminished lower extremity pulse,  no cyanosis no clubbing SKIN:  No rashes no nodules, bruising NEURO:  Cranial nerves II through XII grossly intact, motor grossly intact throughout [facility]:  Cognitively intact, oriented to person place and time  EKG: Sinus rhythm, interventricular conduction delay, rate 65, right axis deviation, no acute ST-T wave change 12/09/2012  ASSESSMENT AND PLAN  CAD:  The patient has no new sypmtoms.  No further cardiovascular testing is indicated.  We will continue with aggressive risk reduction and meds as listed.  HTN:  With his blood pressure is still low given the GI issues I'm going to stop his Lasix but we discussed when necessary dosing. I'm going to reduce his carvedilol 6.25 mg twice daily and his lisinopril to 2.5 mg twice daily. He will contact us by phone after he gets a blood pressure cuff and it his pressure is still low we may need to eliminate medications altogether. We will be checking a basic metabolic profile today as he had some hyperkalemia and slightly elevated creatinine above baseline recently. I reviewed his labs.  CAROTID STENOSIS:  We will follow this up in the future.  CHF:  As above.  GI:  I reviewed the virtual colonoscopy which demonstrates probable cancer in the sigmoid colon. I spoke with Dr. Kinnie Scales.  I then spoke with the patient and his wife and gave him this information. Per Dr. Kinnie Scales he suggests the next session is referral to general surgery.  Of note I will likely need a stress perfusion study if surgery is needed.

## 2012-12-09 NOTE — Patient Instructions (Addendum)
Please decrease your Carvedilol to 6.25 mg one twice a day Decrease Enalapril to 2.5 mg one twice a day Stop Furosemide and take only as needed for ankle swelling.  Please keep a check on your blood pressure daily and call if it continues to be low.  Should it continue to be low further adjustments may need to be made.  Please have blood work today  (CMP and CBC)  You have been referred to Dr Ovidio Kin or Dr Avel Peace within the next 7 days  [phone].  Follow up in 4 months with Dr Antoine Poche.

## 2012-12-10 ENCOUNTER — Ambulatory Visit (INDEPENDENT_AMBULATORY_CARE_PROVIDER_SITE_OTHER): Payer: Medicare Other | Admitting: *Deleted

## 2012-12-10 ENCOUNTER — Encounter: Payer: Self-pay | Admitting: Internal Medicine

## 2012-12-10 DIAGNOSIS — Z95810 Presence of automatic (implantable) cardiac defibrillator: Secondary | ICD-10-CM

## 2012-12-10 DIAGNOSIS — I472 Ventricular tachycardia: Secondary | ICD-10-CM

## 2012-12-10 DIAGNOSIS — I5022 Chronic systolic (congestive) heart failure: Secondary | ICD-10-CM

## 2012-12-10 DIAGNOSIS — I4729 Other ventricular tachycardia: Secondary | ICD-10-CM

## 2012-12-22 ENCOUNTER — Encounter (INDEPENDENT_AMBULATORY_CARE_PROVIDER_SITE_OTHER): Payer: Self-pay

## 2012-12-22 ENCOUNTER — Ambulatory Visit (INDEPENDENT_AMBULATORY_CARE_PROVIDER_SITE_OTHER): Payer: Medicare Other | Admitting: Surgery

## 2012-12-22 ENCOUNTER — Encounter (INDEPENDENT_AMBULATORY_CARE_PROVIDER_SITE_OTHER): Payer: Self-pay | Admitting: Surgery

## 2012-12-22 VITALS — BP 110/60 | HR 72 | Resp 20 | Ht 66.0 in | Wt 157.2 lb

## 2012-12-22 DIAGNOSIS — K56609 Unspecified intestinal obstruction, unspecified as to partial versus complete obstruction: Secondary | ICD-10-CM

## 2012-12-22 DIAGNOSIS — K5730 Diverticulosis of large intestine without perforation or abscess without bleeding: Secondary | ICD-10-CM

## 2012-12-22 DIAGNOSIS — K56699 Other intestinal obstruction unspecified as to partial versus complete obstruction: Secondary | ICD-10-CM | POA: Insufficient documentation

## 2012-12-22 MED ORDER — PRAVASTATIN SODIUM 80 MG PO TABS: ORAL_TABLET | ORAL | Status: DC

## 2012-12-22 NOTE — Progress Notes (Addendum)
Re:   [name] DOB:   02-May-1936 MRN:   [id number]  ASSESSMENT AND PLAN: 1.  Sigmoid colon lesion by virtual colonoscopy  Possible colon cancer - though not definitive.  I reviewed with the patient the findings and need for colon surgery.  I discussed both surgical and non surgical options.  I discussed the role of laparoscopic and open surgery in colon surgery.  I reviewed the risks of surgery, including, but not limited to, infection, bleeding, nerve injury, anastomotic leaks, and possibility of colostomy.  I provided the patient literature on colon surgery.  I tried to answer all questions for the patient.  [name] is high risk for surgery and he understands that we do not have a diagnosis.  Plan: 1) Proceed with cardiac clearance, 2) Try to prove, as best we can, whether this stricture/mass is cancer vs benign - colonoscopy vs PET scan, 3) If benign, leave alone and consider what follow up is necessary.  If cancer, will need to proceed to surgery. Discussed with Drs. Nadel/Medoff.  2.  Ventricular tachycardia  ICD 3.  History of CAD  MI in 05/24/2012  History of CABG in 2002's  Sees Dr. Daiva Nakayama (last seen 12/09/2012) who suggested a stress perfusion study preoperatively.  Needs cardiac clearance. [Myocardial perfusion - EF - 34%, inferior wall hypokinesis. 01/11/2013.  DN 01/15/2103] [Note from Dr. Antoine Poche: His study shows previous infarct with a moderately reduced EF as was known previously. No new findings. Therefore, based on ACC/AHA guidelines, the patient would be at acceptable risk for the planned procedure without further cardiovascular testing. (at the end of the Card Nuc Med Study)  I talked to the patient by phone.  He really does not want anything done. He understands that there is a possibility of malignancy in this sigmoid stricture, that I have recommended a colonoscopy to look at the area, but he is basically asymptomatic and wants nothing done now. He's  agreed to readdress this with Dr. Kriste Basque when he sees him in December. He'll call me back if her reconsiders. DN 9/24//2014] [Dr. Kriste Basque obtained a PET scan on 05/24/2013.  This showed "Focal thickening and associated narrowing of the sigmoid colon, with abnormal hypermetabolism."  I spoke with Dr. Kriste Basque, who said that [name] is doing well from a GI standpoint.  I would be happy to see [name] to again discuss the options.  For now his options continue to be observation vs colonoscopy vs surgery.  That the process has not proceeded is somewhat favorable.  DN  06/07/2013]  [Note from Dr. Kriste Basque - still concerned about the strictured area as being a primary malignancy, but patient wants continued watchful waiting.  DN  09/01/2013]  5.  Type II Diabetes  Hgb A1C - 6.9 - 11/16/2012 6.  Asthmatic bronchitis/COPD  Sees Dr. Kriste Basque 7.  Non calcified plaques of lung bases on CT scan  The patient said that he did have limited asbestos exposure in the 1950's.  Chief Complaint  Patient presents with  . New Evaluation    eval colon ca   REFERRING PHYSICIAN: NADEL,SCOTT M, MD  HISTORY OF PRESENT ILLNESS: [name] is a 77 y.o. (DOB: 08-27-1935)  white  male whose primary care physician is NADEL,SCOTT M, MD and comes to me today for colonic stricture, suspicious for colon cancer, on virtual colonoscopy.  The patient has a prior history of a colonoscopy about 2002 by Dr. Kinnie Scales.  He was found to have  a segmental colitis at that time.  Dr. Kinnie Scales suggested surgical eval and follow up colonoscopy, but it appears that the patient did neither.  About 2 months ago, he experienced increasing constipation, had abdominal pain, lost about 12 pounds because he did no feel like eating.  But with miralax and the weight loss, his bowels returned to normal and he says that he feels the best he has felt in years.  His bowels are better than they have been.  He takes Metamucil daily.  He has no prior history of  abdominal surgery.  He has no other significant gastrointestinal disease.  Because of his change in bowel habits, because of his medical issues, and history of prior stricture, he underwent a virtual colonoscopy - 12/07/2012 - that showed asymmetric thickening of rectosigmoid colon worrisome for colon cancer and non calcified pleural plaques both lung bases [note, this was a non IV contrast study, so there are limits in looking at the liver re: metastatic disease].  The irony is the patient feels the best he is felt in years, but he has a suspicious peripheral colonoscopy.   Past Medical History  Diagnosis Date  . VENTRICULAR TACHYCARDIA   . PERIPHERAL VASCULAR DISEASE   . HYPERTENSION   . HYPERCHOLESTEROLEMIA   . CORONARY ARTERY DISEASE   . Chronic systolic heart failure   . CAROTID ARTERY DISEASE   . SEBORRHEIC DERMATITIS   . RENAL INSUFFICIENCY   . DIVERTICULOSIS OF COLON   . CPK, ABNORMAL   . COPD   . COLONIC POLYPS   . CANDIDIASIS, ORAL   . ASTHMATIC BRONCHITIS, ACUTE   . ANXIETY   . ICD (implantable cardiac defibrillator) in place   . Myocardial infarction ?1990's    "silent" (05/24/2012)  . Chronic bronchitis     "every year" (05/24/2012)  . Type II diabetes mellitus   . Chronic lower back pain   . DJD (degenerative joint disease)     "hands" (05/24/2012)  . Osteoarthritis of finger       Past Surgical History  Procedure Laterality Date  . Coronary artery bypass graft  1990's    CABG X4  . Tonsillectomy  ?1942  . Cardiac defibrillator placement  12/2004    single chamber defibrillator- [employer] [facility] 8/06 DrKlein [Other][      Current Outpatient Prescriptions  Medication Sig Dispense Refill  . ADVAIR DISKUS 500-50 MCG/DOSE AEPB INHALE ONE DOSE BY MOUTH TWICE DAILY  60 each  5  . aspirin EC 325 MG tablet Take 325 mg by mouth every evening.      . carvedilol (COREG) 6.25 MG tablet Take 1 tablet (6.25 mg total) by mouth 2 (two) times daily with a meal.  180  tablet  2  . chlorhexidine (PERIDEX) 0.12 % solution       . enalapril (VASOTEC) 2.5 MG tablet Take 1 tablet (2.5 mg total) by mouth 2 (two) times daily.  180 tablet  2  . fenofibrate 160 MG tablet TAKE ONE TABLET BY MOUTH ONCE DAILY. ** PT MUST KEEP UPCOMING APPOINTMENT FOR FURTHER REFILLS.**  90 tablet  0  . furosemide (LASIX) 20 MG tablet Take 1 tablet (20 mg total) by mouth as needed.  30 tablet  11  . glyBURIDE-metformin (GLUCOVANCE) 2.5-500 MG per tablet 1 tablet am, 1/2 tablet pm      . Guaifenesin (MUCINEX MAXIMUM STRENGTH) 1200 MG TB12 Take 1 tablet by mouth 2 (two) times daily.      . hydrocortisone (ANUSOL-HC) 2.5 %  rectal cream Apply after each BM and QHS  30 g  prn  . levalbuterol (XOPENEX HFA) 45 MCG/ACT inhaler Inhale 1-2 puffs into the lungs every 4 (four) hours as needed for wheezing or shortness of breath.  1 Inhaler  6  . mexiletine (MEXITIL) 150 MG capsule TAKE ONE CAPSULE BY MOUTH TWICE DAILY  60 capsule  0  . pravastatin (PRAVACHOL) 80 MG tablet TAKE ONE TABLET BY MOUTH ONCE DAILY.  PT MUST KEEP UPCOMING APPOINTMENT TO RECEIVE FURTHER REFILLS.  90 tablet  0   No current facility-administered medications for this visit.      Allergies  Allergen Reactions  . Niacin Itching    Niaspan     REVIEW OF SYSTEMS: Skin:  No history of rash.  No history of abnormal moles. Infection:  No history of hepatitis or HIV.  No history of MRSA. Neurologic:  No history of stroke.  No history of seizure.  No history of headaches. Cardiac:  CABG x 4 - 2002.  ACD - 2005.  Followed by Drs. Hochrein and Graciela Husbands.  Sees Dr. Daiva Nakayama (last seen 12/09/2012) who suggested a stress perfusion study preoperatively.  Pulmonary:  Smoked up until 2003 when he quit.  Has COPD/Asthma.  He can walk maybe 300 yards before he gets SOB.  He is not on home O2.  Endocrine:  No thyroid disease.  Type 2 Diabetes x 8 years. Gastrointestinal:  No history of stomach disease.  No history of liver disease.  No  history of gall bladder disease.  No history of pancreas disease.  No history of colon disease. Urologic:  Creatinine 1.7 - 12/09/2012 Musculoskeletal:  Lumbar disk disease. Hematologic:  On aspirin daily. Psycho-social:  The patient is oriented.   The patient has no obvious psychologic or social impairment to understanding our conversation and plan.  SOCIAL and FAMILY HISTORY: Married. Wife with patient.  He has two daughters in town.  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  PHYSICAL EXAM: BP 110/60  Pulse 72  Resp 20  Ht 5\' 6"  (1.676 m)  Wt 157 lb 3.2 oz (71.305 kg)  BMI 25.38 kg/m2  General: Older WM who is alert.  He looks Cushingoid, but has only been on short courses of steroids for his lungs.  HEENT: Normal. Pupils equal. Neck: Supple. No mass.  No thyroid mass. Lymph Nodes:  No supraclavicular or cervical nodes. Lungs: Clear to auscultation and symmetric breath sounds. Heart:  RRR. No murmur or rub. Left upper chest ICD.  Has median sternotomy. Abdomen: Soft. No mass. No tenderness. No hernia. Normal bowel sounds.  No abdominal scars. Rectal: Not done. Extremities:  Good strength and ROM  in upper and lower extremities.  Has bruising of both UE.  Neurologic:  Grossly intact to motor and sensory function. Psychiatric: Has normal mood and affect. Behavior is normal.    DATA REVIEWED: Dr. Jennye Boroughs notes, Epic notes, discussion with radiology.  Ovidio Kin, MD,  [facility] [address]  [address]   [place], [place]    [address] Phone:  [phone] FAX:  [phone]

## 2012-12-23 ENCOUNTER — Telehealth: Payer: Self-pay | Admitting: Cardiology

## 2012-12-23 DIAGNOSIS — I25810 Atherosclerosis of coronary artery bypass graft(s) without angina pectoris: Secondary | ICD-10-CM

## 2012-12-23 DIAGNOSIS — Z01810 Encounter for preprocedural cardiovascular examination: Secondary | ICD-10-CM

## 2012-12-23 NOTE — Telephone Encounter (Signed)
Spoke with patient who states he saw Dr. Ezzard Standing for colon stricture per Dr. Antoine Poche referral and Dr. Ezzard Standing wants patient to get a stress test prior to surgery.  Patient states that Dr. Ezzard Standing was going to contact Dr. Antoine Poche.  I advised patient that Dr. Antoine Poche is out of the office until Aug. 11.  Patient is in agreement that he would like to wait until Dr. Antoine Poche returns to have stress test ordered as he says there may be additional testing Dr. Antoine Poche would like to order.  Patient will be out-of-town Monday Aug. 4 through approximately Aug. 12.  Please call cell [phone] during that time if you cannot reach him at home.

## 2012-12-23 NOTE — Telephone Encounter (Signed)
New Prob      Pt would like to speak to the nurse regarding a stress test. Please call.

## 2012-12-27 LAB — REMOTE ICD DEVICE
BATTERY VOLTAGE: 3.0716 V
BRDY-0002RV: 40 {beats}/min
DEV-0020ICD: NEGATIVE
PACEART VT: 0
TOT-0002: 0
TOT-0006: 20131231000000
TZAT-0004SLOWVT: 6
TZAT-0004SLOWVT: 8
TZAT-0005SLOWVT: 78 pct
TZAT-0005SLOWVT: 88 pct
TZAT-0011SLOWVT: 10 ms
TZAT-0011SLOWVT: 10 ms
TZAT-0019FASTVT: 8 V
TZAT-0020FASTVT: 1.5 ms
TZON-0004VSLOWVT: 28
TZON-0005SLOWVT: 12
TZST-0001FASTVT: 3
TZST-0001FASTVT: 5
TZST-0001FASTVT: 6
TZST-0001SLOWVT: 3
TZST-0001SLOWVT: 4
TZST-0001SLOWVT: 6
TZST-0002FASTVT: NEGATIVE
TZST-0002FASTVT: NEGATIVE
TZST-0003SLOWVT: 20 J
TZST-0003SLOWVT: 35 J
VENTRICULAR PACING ICD: 0.25 pct

## 2013-01-02 NOTE — Telephone Encounter (Signed)
OK to order Lexiscan Myoview.  

## 2013-01-04 NOTE — Telephone Encounter (Signed)
Left message to call back  

## 2013-01-06 NOTE — Telephone Encounter (Signed)
New Problem   Pt wants to make an appt for a test for a possible operation/ wants to speak to nurse to determine which test he should take

## 2013-01-06 NOTE — Telephone Encounter (Signed)
Pt will be called with orders and an appt for Lexiscan.

## 2013-01-11 ENCOUNTER — Other Ambulatory Visit: Payer: Medicare Other | Admitting: Gastroenterology

## 2013-01-11 ENCOUNTER — Ambulatory Visit (HOSPITAL_COMMUNITY): Payer: Medicare Other | Attending: Cardiology | Admitting: Radiology

## 2013-01-11 VITALS — BP 141/74 | HR 70 | Ht 67.0 in | Wt 159.0 lb

## 2013-01-11 DIAGNOSIS — Z01810 Encounter for preprocedural cardiovascular examination: Secondary | ICD-10-CM

## 2013-01-11 DIAGNOSIS — R0609 Other forms of dyspnea: Secondary | ICD-10-CM | POA: Insufficient documentation

## 2013-01-11 DIAGNOSIS — I2510 Atherosclerotic heart disease of native coronary artery without angina pectoris: Secondary | ICD-10-CM

## 2013-01-11 DIAGNOSIS — I739 Peripheral vascular disease, unspecified: Secondary | ICD-10-CM | POA: Insufficient documentation

## 2013-01-11 DIAGNOSIS — I25810 Atherosclerosis of coronary artery bypass graft(s) without angina pectoris: Secondary | ICD-10-CM

## 2013-01-11 DIAGNOSIS — R0602 Shortness of breath: Secondary | ICD-10-CM

## 2013-01-11 DIAGNOSIS — R55 Syncope and collapse: Secondary | ICD-10-CM | POA: Insufficient documentation

## 2013-01-11 DIAGNOSIS — E119 Type 2 diabetes mellitus without complications: Secondary | ICD-10-CM | POA: Insufficient documentation

## 2013-01-11 DIAGNOSIS — Z87891 Personal history of nicotine dependence: Secondary | ICD-10-CM | POA: Insufficient documentation

## 2013-01-11 DIAGNOSIS — R5381 Other malaise: Secondary | ICD-10-CM | POA: Insufficient documentation

## 2013-01-11 DIAGNOSIS — I6529 Occlusion and stenosis of unspecified carotid artery: Secondary | ICD-10-CM | POA: Insufficient documentation

## 2013-01-11 DIAGNOSIS — I10 Essential (primary) hypertension: Secondary | ICD-10-CM | POA: Insufficient documentation

## 2013-01-11 DIAGNOSIS — R0989 Other specified symptoms and signs involving the circulatory and respiratory systems: Secondary | ICD-10-CM | POA: Insufficient documentation

## 2013-01-11 DIAGNOSIS — Z8249 Family history of ischemic heart disease and other diseases of the circulatory system: Secondary | ICD-10-CM | POA: Insufficient documentation

## 2013-01-11 MED ORDER — TECHNETIUM TC 99M SESTAMIBI GENERIC - CARDIOLITE
30.0000 | Freq: Once | INTRAVENOUS | Status: AC | PRN
  Administered 2013-01-11: 30 via INTRAVENOUS

## 2013-01-11 MED ORDER — REGADENOSON 0.4 MG/5ML IV SOLN
0.4000 mg | Freq: Once | INTRAVENOUS | Status: AC
  Administered 2013-01-11: 0.4 mg via INTRAVENOUS

## 2013-01-11 MED ORDER — TECHNETIUM TC 99M SESTAMIBI GENERIC - CARDIOLITE
10.0000 | Freq: Once | INTRAVENOUS | Status: AC | PRN
  Administered 2013-01-11: 10 via INTRAVENOUS

## 2013-01-11 NOTE — Progress Notes (Signed)
[facility] SITE 3 NUCLEAR MED [address] [place], [place] [address] [phone]    Cardiology Nuclear Med Study  [name] is a 77 y.o. male     MRN : [id number]     DOB: [birth year]  Procedure Date: 01/11/2013  Nuclear Med Background Indication for Stress Test:  Evaluation for Ischemia, Graft Patency, and Pending Surgical Clearance for Colon Surgery by Dr. Ovidio Kin History: '89 MPS: no report, '02 CABG, '06 AICD (HX VT), '08 Cath: SVG patent, Atretic LIMA, EF=40%, '2013 Silent MI, '13 Echo: EF=35-40% Cardiac Risk Factors: Carotid Disease, Family History - CAD, History of Smoking, Hypertension, Lipids, NIDDM and PVD  Symptoms:  Dizziness, DOE, Fatigue, Light-Headedness and SOB   Nuclear Pre-Procedure Caffeine/Decaff Intake:  None > 12 hrs NPO After: 7:00am   Lungs:Diminished breath sounds, no wheezing; O2 Sat 92-96% RA IV 0.9% NS with Angio Cath:  22g  IV Site: R Antecubital x 1, tolerated well IV Started by:  Irean Hong, RN  Chest Size (in):  40 Cup Size: n/a  Height: 5\' 7"  (1.702 m)  Weight:  159 lb (72.122 kg)  BMI:  Body mass index is 24.9 kg/(m^2). Tech Comments:  advair this am    Nuclear Med Study 1 or 2 day study: 1 day  Stress Test Type:  Lexiscan  Reading MD: Cassell Clement, MD  Order Authorizing Provider:  Rollene Rotunda, MD  Resting Radionuclide: Technetium 6m Sestamibi  Resting Radionuclide Dose: 11.0 mCi   Stress Radionuclide:  Technetium 65m Sestamibi  Stress Radionuclide Dose: 33.0 mCi           Stress Protocol Rest HR: 70 Stress HR: 89  Rest BP: 141/74 Stress BP: 160/71  Exercise Time (min): n/a METS: n/a   Predicted Max HR: 145 bpm % Max HR: 61.38 bpm Rate Pressure Product: 40347   Dose of Adenosine (mg):  n/a Dose of Lexiscan: 0.4 mg  Dose of Atropine (mg): n/a Dose of Dobutamine: n/a mcg/kg/min (at max HR)  Stress Test Technologist: Irean Hong, RN  Nuclear Technologist:  Domenic Polite, CNMT     Rest  Procedure:  Myocardial perfusion imaging was performed at rest 45 minutes following the intravenous administration of Technetium 33m Sestamibi. Rest ECG: NSR-LBBB  Stress Procedure:  The patient received IV Lexiscan 0.4 mg over 15-seconds.  Technetium 21m Sestamibi injected at 30-seconds. The patient complained of woozy sensation, but denied chest pain. Quantitative spect images were obtained after a 45 minute delay. Stress ECG: No significant change from baseline ECG  QPS Raw Data Images:  Normal; no motion artifact; normal heart/lung ratio. Stress Images:  There is decreased uptake in the inferior wall. Rest Images:  There is decreased uptake in the inferior wall. Subtraction (SDS):  There is a fixed defect that is most consistent with a previous infarction. Transient Ischemic Dilatation (Normal <1.22):  n/a Lung/Heart Ratio (Normal <0.45):  0.36  Quantitative Gated Spect Images QGS EDV:  120 ml QGS ESV:  80 ml  Impression Exercise Capacity:  Lexiscan with no exercise. BP Response:  Normal blood pressure response. Clinical Symptoms:  No significant symptoms noted. ECG Impression:  Baseline:  LBBB.  EKG uninterpretable due to LBBB at rest and stress. Comparison with Prior Nuclear Study: No images to compare  Overall Impression:  Intermediate risk nuclear study. There is a medium sized fixed inferior wall defect of moderate severity involving the apical inferior and mid inferior and basal inferoseptal segments, consistent with old inferior MI. No significant reversible  ischemia.  LV Ejection Fraction: 34%.  LV Wall Motion:  Inferior wall hypokinesis.   Cassell Clement

## 2013-01-12 ENCOUNTER — Telehealth: Payer: Self-pay | Admitting: Cardiology

## 2013-01-12 HISTORY — PX: CARDIOVASCULAR STRESS TEST: SHX262

## 2013-01-12 NOTE — Telephone Encounter (Signed)
Pt rtn call re stress test

## 2013-01-13 NOTE — Telephone Encounter (Signed)
Documentation states pt has been called with results and recommendations

## 2013-01-14 ENCOUNTER — Encounter: Payer: Self-pay | Admitting: *Deleted

## 2013-01-22 ENCOUNTER — Other Ambulatory Visit: Payer: Self-pay | Admitting: Cardiology

## 2013-02-18 ENCOUNTER — Other Ambulatory Visit: Payer: Self-pay | Admitting: Internal Medicine

## 2013-03-18 ENCOUNTER — Other Ambulatory Visit: Payer: Self-pay

## 2013-03-18 ENCOUNTER — Telehealth: Payer: Self-pay | Admitting: Internal Medicine

## 2013-03-18 MED ORDER — MEXILETINE HCL 150 MG PO CAPS: ORAL_CAPSULE | ORAL | Status: DC

## 2013-03-18 NOTE — Telephone Encounter (Signed)
New message    Picked up pres and it is incorrect---sent to refill

## 2013-03-31 ENCOUNTER — Other Ambulatory Visit: Payer: Self-pay

## 2013-05-05 ENCOUNTER — Other Ambulatory Visit: Payer: Self-pay

## 2013-05-05 MED ORDER — PRAVASTATIN SODIUM 80 MG PO TABS: ORAL_TABLET | ORAL | Status: DC

## 2013-05-09 ENCOUNTER — Ambulatory Visit (INDEPENDENT_AMBULATORY_CARE_PROVIDER_SITE_OTHER): Payer: Medicare Other | Admitting: Pulmonary Disease

## 2013-05-09 ENCOUNTER — Encounter: Payer: Self-pay | Admitting: Pulmonary Disease

## 2013-05-09 ENCOUNTER — Other Ambulatory Visit (INDEPENDENT_AMBULATORY_CARE_PROVIDER_SITE_OTHER): Payer: Medicare Other

## 2013-05-09 VITALS — BP 108/62 | HR 78 | Temp 97.3°F | Ht 66.5 in | Wt 163.6 lb

## 2013-05-09 DIAGNOSIS — E119 Type 2 diabetes mellitus without complications: Secondary | ICD-10-CM

## 2013-05-09 DIAGNOSIS — C189 Malignant neoplasm of colon, unspecified: Secondary | ICD-10-CM

## 2013-05-09 DIAGNOSIS — N259 Disorder resulting from impaired renal tubular function, unspecified: Secondary | ICD-10-CM

## 2013-05-09 DIAGNOSIS — Z95810 Presence of automatic (implantable) cardiac defibrillator: Secondary | ICD-10-CM

## 2013-05-09 DIAGNOSIS — I10 Essential (primary) hypertension: Secondary | ICD-10-CM

## 2013-05-09 DIAGNOSIS — J449 Chronic obstructive pulmonary disease, unspecified: Secondary | ICD-10-CM

## 2013-05-09 DIAGNOSIS — I2510 Atherosclerotic heart disease of native coronary artery without angina pectoris: Secondary | ICD-10-CM

## 2013-05-09 DIAGNOSIS — I472 Ventricular tachycardia: Secondary | ICD-10-CM

## 2013-05-09 DIAGNOSIS — E7800 Pure hypercholesterolemia, unspecified: Secondary | ICD-10-CM

## 2013-05-09 DIAGNOSIS — M545 Low back pain: Secondary | ICD-10-CM

## 2013-05-09 DIAGNOSIS — K56699 Other intestinal obstruction unspecified as to partial versus complete obstruction: Secondary | ICD-10-CM

## 2013-05-09 DIAGNOSIS — F411 Generalized anxiety disorder: Secondary | ICD-10-CM

## 2013-05-09 DIAGNOSIS — D649 Anemia, unspecified: Secondary | ICD-10-CM

## 2013-05-09 DIAGNOSIS — I5022 Chronic systolic (congestive) heart failure: Secondary | ICD-10-CM

## 2013-05-09 DIAGNOSIS — I739 Peripheral vascular disease, unspecified: Secondary | ICD-10-CM

## 2013-05-09 DIAGNOSIS — K56609 Unspecified intestinal obstruction, unspecified as to partial versus complete obstruction: Secondary | ICD-10-CM

## 2013-05-09 DIAGNOSIS — M1990 Unspecified osteoarthritis, unspecified site: Secondary | ICD-10-CM

## 2013-05-09 LAB — BASIC METABOLIC PANEL
Calcium: 9.5 mg/dL (ref 8.4–10.5)
GFR: 42.46 mL/min — ABNORMAL LOW (ref >60.00)
Potassium: 5.1 mEq/L (ref 3.5–5.1)
Sodium: 138 mEq/L (ref 135–145)

## 2013-05-09 LAB — CBC WITH DIFFERENTIAL/PLATELET
Basophils Absolute: 0.1 10*3/uL (ref 0.0–0.1)
Basophils Relative: 0.6 % (ref 0.0–3.0)
Eosinophils Absolute: 0.2 10*3/uL (ref 0.0–0.7)
HCT: 41.2 % (ref 39.0–52.0)
Hemoglobin: 13.7 g/dL (ref 13.0–17.0)
Lymphs Abs: 2.5 10*3/uL (ref 0.7–4.0)
MCHC: 33.2 g/dL (ref 30.0–36.0)
Monocytes Relative: 12.6 % — ABNORMAL HIGH (ref 3.0–12.0)
Neutro Abs: 6.9 10*3/uL (ref 1.4–7.7)
RBC: 4.72 Mil/uL (ref 4.22–5.81)
RDW: 15.4 % — ABNORMAL HIGH (ref 11.5–14.6)

## 2013-05-09 LAB — HEPATIC FUNCTION PANEL
AST: 36 U/L (ref 0–37)
Alkaline Phosphatase: 45 U/L (ref 39–117)
Bilirubin, Direct: 0.2 mg/dL (ref 0.0–0.3)

## 2013-05-09 NOTE — Progress Notes (Signed)
Subjective:     Patient ID: [name], male   DOB: 1936-03-23, 77 y.o.   MRN: [id number]  HPI 77 y/o WM here for a follow up visit... he has multiple medical problems as noted below...   ~  November 04, 2011:  43mo ROV & [name] states that he is doing fine- no new complaints or concerns, he didn't even bring his meds or med list for review...     He saw DrAllred 1/13> doing well w/o CP, palpit, SOB, orthop, PND, etc; ICD interrogated & functioning normally, no med changes made...    He saw DrHochrein 1/13> doing satis & relatively asymptomatic but not exercising; rec to continue aggressive risk reduction strategy; same meds...    We reviewed prob list, meds, xrays and labs> see below>> CXR 6/13 showed stable heart size s/p CABG, pacer, hyperinflated/ clear lungs w/ interstitial prominence, DJD in TSpine... LABS 2/13 by DrHochrein:  TChol 136, TG 105, HDL 31, LDL 84 LABS 6/13:  Chems- XL=24 A1c=7.0 BUN=21 Creat=1.4 K=5.5   ~  May 04, 2012:  69mo ROV & [name] notes some incr congestion & cough, he had resp exac 11/13 treated by TP w/ Augmentin & Pred- improved;  He is also c/o LBP x several weeks, pain is incr w/ movement, not radiating to his legs- he is rec to rest, apply heat, & try Tramadol & Robaxin Tid, we will also set up referral to Ortho- DrWainer...  We reviewed the following medical problems during today's office visit>>     AB, COPD> on Advair500Bid, Xopenex prn, Mucinex-2Bid, off Pred; recent exac treated w/ Augmentin & Pred- improved...    HBP> on Coreg12.5Bid, Vasotec5Bid, Lasix20; BP= 100/58 & he denies CP, palpit, ch in SOB/DOE, edema, etc...    CAD> on MWN027; Hx Lmain + 3vessel dis & CABGx4 in 2003; f/u cath 2008 w/ atretic LIMA to LAD;     CHF, Ischemic Cardiomyop> EF= 35-40% by 2DEcho 2/13; he continues regular f/u w/ DrHochrein...    Hx VTach, AICD> on Mexiletine150Bid; he is followed by DrKlein/Allred & stable on Mexiletine w/o AICD firing; new AICD generator needed  soon...    ASPVDz> on ASA325; Hx PTA to right SFA for ABI=0.36 in past; not active enough for claud; known plaque in Carotids w/o signif flow reduction...    CHOL> on Prav80, Feno160; last FLP 2/13 showed TChol 136, TG 105, HDL 31, LDL 84    DM> on Glucovance2.5-500; labs today showed BS=134, A1c=7.5 and asked to incr Glucov to an extra 1/2 tab in PM...    GI- Divertics, Polyps> hx adenomatous polyp in 1997, last colonoscopy 2002 by [facility] w/ divertics only; he is overdue for f/u colon but declines to sched f/u appt...    Renal Insuffic> baseline Creat= 1.4-1.5; labs today reveals BUN=27, Creat=1,8, K=5.2; he is rec to decr the Lasix20 to just PRN for edema...    DJD> followed by DrWainer w/ shots in shoulders in the past; now c/o LBP & we will Rx w/ rest, heat, Tramadol, Robaxin & refer to DrWainer...    Anxiety> aware- he is not on an anxiolytic med by his pref... We reviewed prob list, meds, xrays and labs> see below for updates >>  LABS 12/13 (not fasting):  Chems- BS=158, A1c=7.5, BUN=27, Creat=1.8, K=5.2    ~  November 15, 2012:  69mo ROV & [name] is c/o constipation, plus he is long overdue for f/u colon w/ hx adenomatous polyps; we decided to check KUB XRay=> air &  stool scat about the colon but stool burden NOT substantially increased; we rec MIRALAX daily  & refer to GI as noted...  We reviewed the following medical problems during today's office visit >>     AB, COPD> on Advair500Bid, Xopenex prn, Mucinex-2Bid, off Pred; prev exac treated w/ Augmentin & Pred- improved; now stable w/ min cough, stable DOE etc...    HBP> on Coreg12.5Bid, Vasotec5Bid, Lasix40; BP= 120/70 & he feels the Lasix is too strong- labs show BUN=35 Cr=1.6 therefore rec decr to 1/2 tab daily (20mg )...     CAD> on ASA325; Hx Lmain + 3vessel dis & CABGx4 in 2003; f/u cath 2008 w/ atretic LIMA to LAD; he denies CP, palpit, dizzy, ch in SOB, edema...    CHF, Ischemic Cardiomyop> EF= 35-40% by 2DEcho 2/13; he continues regular  f/u w/ DrHochrein...    Hx VTach, AICD> on Mexiletine150Bid; he is followed by DrKlein/Taylor & stable on Mexiletine- generator changed 12/13, seen 3/14 & stable, they incr Lasix to 40 but he feels it's too strong.    ASPVDz> on ASA325; Hx PTA to right SFA for ABI=0.36 in past; not active enough for claud; known plaque in Carotids w/o signif flow reduction...    CHOL> on Prav80, Feno160; FLP 6/14 showed TChol 69, TG 169, HDL 15, LDL 21; rec to continue meds better low fat diet, incr exerc...    DM> on Glucovance2.5-500 (oneQam & 1/2Qpm); labs today showed BS=111, A1c=6.9 and asked to get on better low carb diet!    GI- Divertics, Polyps> hx adenomatous polyp in 1997, last colonoscopy 2002 by [facility] w/ divertics only; he is overdue for f/u colon & c/o constip=> KUB w/o impaction, start Miralax, refer to GI...    Renal Insuffic> baseline Creat= 1.4-1.5; labs today reveals BUN=35, Creat=1,6, K=5.6; he is rec to decr the Lasix40 back to 20mg /d...    DJD> followed by DrWainer w/ shots in shoulders in the past; now c/o LBP & we will Rx w/ rest, heat, Tramadol, Robaxin & refer to DrWainer...    Anxiety> aware- he is not on an anxiolytic med by his pref... We reviewed prob list, meds, xrays and labs> see below for updates >>  KUB XRay 6/14 showed air & stool scat about the colon but stool burden NOT substantially increased, DJD in TSpine...  LABS 6/14:  FLP- TChol&LDL are ok but HDL=15, TG=169;  Chems- showed BS=111 A1c=6.9, K=5.6 BUN=35 Cr=1.6;  CBC- ok;  TSH=2.10;  PSA=0.45...  ~  May 09, 2013:  58mo ROV & [name] saw [facility] 7/14 for GI f/u & felt to be hi risk for colonoscopy; Virtual CT Colon was done 7/14 finding> stricture of the rectosigmoid w/ asymmetrical thickening of the rectosigmoid colon at 30 cm from the anal verge, worrisome for sigmoid colon carcinoma, 5 mm polyp at 8-10 cm from the anal verge, diverticulosis as well with multiple diverticula within the rectosigmoid colon; Probable  noncalcified pleural plaques at both lung bases; Degenerative disc disease diffusely throughout the lumbar spine with anterolisthesis of L3 on L4 by 5 mm most likely degenerative... He was sent to [facility] 7/14> he rec surgery but he is obviously high risk he sent him to [facility] for clearance>>    He saw DrTaylor last 3/14> ischemic cardiomyop, chr sys CHF, Hx VT on Mexiletine150Bid & s/p ICD placement; norm device function, no shocks, Lasix incr to 40mg /d, no salt etc...     He saw DrHochrein 7/14> CAD (on ASA325), ischemic cardiomyop, denies CP/ palpit, but weak &  SOB, BP was low after virtual colon w/ diarrhea; he stopped his Lasix & decr Coreg to 6.25Bid and Lisin to 2.5Bid...     They did Myoview 8/14> LBBB on EKG, medium sized fixed infer wall defect c/w old IWMI, no reversible ischemia noted, EF=34% w/ infer wall HK...     He saw [facility] 7/14 as well & they discussed surg> pt states that he made p his own mind against surg, BMs are better on metamucil daily We reviewed prob list, meds, xrays and labs> see below for updates >> Given 2014 Flu vaccine today...  LABS 12/14:  Chems ok x BS=129, A1c=6.9, Cr=1.7;  LFTs are now normal off all Etoh;  CBC- ok w/ Hg=13.7, Fe=75 (15%);  CEA=5.3.Marland KitchenMarland Kitchen PET Scan => done 12/14 => focal thickening & assoc narrowing of sigmoid colon w/ abnormal hypermetabolism (colon ca vs inflamm), stool in colon, atherosclerotic vasc dis, asbestos related pleural dis bilat...   ADDENDUM>> pt understands options for 1) proceed w/ colonoscopy & bx;  2) proceed w/ definitive surg approach;  3) continue watchful waiting w/ f/u CEA and CT Abd in ?55mo- he will need to be diligent about metamucil, miralax, etc top keep stools soft & moving so as to avoid constip/ obstruction thru the narrowed sigmoid region...          Problem List:  ASTHMATIC BRONCHITIS, ACUTE (ICD-466.0) - occas acute infectious exacerbations... ~  12/10: had bronchitic exac Rx'd w/ Avelox, Pred, Mucinex, etc &  improved==> CXR showed COPD/E, incr interstitial markings & scarring, calcif pleural plaques, enlarged heart & pacer, NAD.Marland Kitchen.  ~  7/11:  similar episode, responded to similar meds... ~  11/13: similar resp exac treated by TP w/ Augmentin & Pred- improved... ~  12/14: on Advair500Bid, XopenexHFA prn, GFN1200Bid; breathing is stable w/o recent exac...  COPD (ICD-496) - ex-smoker, quit 2002... he clearly does better on regular dosing of ADVAIR 500Bid, MUCINEX 1200mg Bid, XOPENEX Prn... urged to continue these regularly and get more regular exercise... ~  CXR 6/13 showed stable heart size s/p CABG, pacer, hyperinflated/ clear lungs w/ interstitial prominence, DJD in TSpine... ~  CXR 11/13 showed cardiomeg & prev CABG, left subclav AICD, atherosclerotic calcif of Ao, calcif pleural plaques, emphysema w/o acute changes...  HYPERTENSION (ICD-401.9) - meds for BP & his Chr CHF= COREG 12.5mg Bid,  ENALAPRIL 5mg Bid, & LASIX 20mg /d...   ~  labs 1/10 showed K= 5.6, BUN= 31, Creat= 1.9..Marland Kitchen ACE/ ARB stopped... pt didn't follow up. ~  8/10: Coreg + Enalapril restarted by DrHochrein... ~  labs 9/10 showed K=5.6, BUN=19, Creat=1.5 ~  labs 12/10 showed K=4.4, BUN= 32, Creat= 1.7, BNP= 102 ~  labs 4/11 showed K=5.1, BUN=16, Creat=1.4 ~  10/11: BP=122/68 today, and feeling well- he denies HA, fatigue, visual changes, CP, palipit, dizziness, syncope, edema, etc. ~  labs 2/13 showed K=5.7, BUN=27, Creat=1.5 ~  6/13: BP= 112/62 and he denies CP, palpit, dizzy, syncope, ch in SOB, edema... ~  Labs 6/13 showed K=5.5, BUN=21, Creat=1.4 ~  12/13: on Coreg12.5Bid, Vasotec5Bid, Lasix20; BP= 100/58 & he denies CP, palpit, ch in SOB/DOE, edema, etc; we cut the LASIX to 20mg  prn swelling due to Creat=1.8 ~  6/14: on Coreg12.5Bid, Vasotec5Bid, Lasix40; BP= 120/70 & he feels the Lasix is too strong- labs show BUN=35 Cr=1.6 therefore rec decr to 1/2 tab daily (20mg ).  ~  12/14: on Coreg6.25Bid, Vasotec2.5Bid, Lasix20; BP= 108/62 & he  is feeling well & denies CP, palpit, ch in dyspnea/ edema/ etc...  CORONARY ARTERY DISEASE (  ICD-414.00) - no chest pains, no palpit, no defib discharges... ~ Hx 3 vessel CAD w/ CABG x4 in 2/03 for LMain dis & 3vessel dis + mod LVD EF=35% at that time... ~ Cath 7/06 w/ non-filling of SVG to PDA, & LIMA to LAD was atretic, w/ EF=45%... ~ Cath 10/08 w/ norm SVG to PDA, & atretic LIMA to LAD (due to good native vessel flow), EF=40%... note is made of a 40-50% LMain lesion... ~  EKG 1/13 showed SBrady, rightward axis, IVCD, NSSTTWA... ~  EKG 7/14 showed NSR, rate65, LBBB, T-wave abn... ~  Myoview 7/14> LBBB on EKG, medium sized fixed infer wall defect c/w old IWMI, no reversible ischemia noted, EF=34% w/ infer wall HK...   CONGESTIVE HEART FAILURE, SYSTOLIC, CHRONIC (ICD-428.0) - ischemic cardiomyopathy & meds as above... followed by DrHochrein in the CHF clinic...  see labs above>> ~  seen 8/10 in CHF clinic- Coreg + Enalapril restarted, labs reviewed. ~  2DEcho 2/13 showed LV mildly dilated w/ diffuse HK, incr wall thickness c/w mild LVH, EF=35-40%, mild MR, mild LAdil... ~  Labs 6/13 showed Creat=1.4 & BNP=83 ~  Labs 12/13 showed Creat=1.8 and his Lasix20 was cut to just prn for swelling... ~  3/14: DrTaylor increased his Lasix to 40mg /d; pt feels this is too strong for him... ~  6/14: Labs showed K=5.6 BUN=35 Cr=1.6 and he is asked to decr the Lasix back to 1/2 tab daily (20mg )... ~  7/14: He saw DrHochrein> CAD (on ASA325), ischemic cardiomyop, denies CP/ palpit, but weak & SOB, BP was low after virtual colon w/ diarrhea; he stopped his Lasix & decr Coreg to 6.25Bid and Lisin to 2.5Bid.  Hx of VENTRICULAR TACHYCARDIA (ICD-427.1) - prev documented medication non-compliance w/ Amio & Mexilitene... DrHochrein has adjusted his meds... ~ hx VTach storm w/ AICD placed, followed by DrTaylor/Klein on meds... ~ DC'd 10/08 on Amiodarone 200mg - 2 tabs twice daily, and MEXILETINE 150mg Bid... subseq decr  Amio 200mg Bid... ~ f/u w/ DrKlein 9/09 w/ defib check- doing well... ~  f/u 9/10 w/ DrKlein on Mexilitene alone, Amio stopped for intolerance... doing well- no changes made. ~  f/u 1/13 w/ DrAllred> stable on Mexilitene, & no changes made... ~  12/13: DrTaylor fixed one lead & changed the AICD generator, he remains stable on the Mexilitene... ~  3/14: He saw DrTaylor> ischemic cardiomyop, chr sys CHF, Hx VT on Mexiletine150Bid & s/p ICD placement; norm device function, no shocks, Lasix incr to 40mg /d, no salt etc.  PERIPHERAL VASCULAR DISEASE (ICD-443.9) >> on ASA 325mg /d... ~ s/p PTA to right SFA for an ABI of 0.36...  ~  CDopplers 10/09 showed bilat carotid disease w/ bruits and mod plaque in bulbs, 0-39% bilat... no change. ~  CDopplers 11/11 showed mod heterogeneous plaque in bulbs, 0-39% bilat ICA stenoses, f/u 14yrs...  HYPERCHOLESTEROLEMIA (ICD-272.0) - prev on Simvastatin 40mg /d, and changed to PRAVASTATIN 80mg /d per DrHochrein (due to interaction of Simva & Amio)... FENOFIBRATE 160mg /d added 8/09... ~  FLP 8/08 on Simva40 showed TChol 136, TG 297, HDL 22, LDL 69- but he was ?not taking meds regularly? he freq forgets. ~  FLP 8/09 on Prav80 showed TChol 124, TG 257, HDL 24, LDL 65... rec to add Fenofibrate 160/d... ~  FLP 9/10 on Prav80+Feno160 showed TChol 137, TG 142, HDL 26, LDL 83... continue both meds. ~  FLP 4/11 on Prav80+Feno160 showed TChol 131, TG 139, HDL 33, LDL 70 ~  FLP 10/11 = pt didn't go to the lab for blood work... ~  FLP 2/13 on Prav80+Feno160 showed TChol 136, TG 105, HDL 31, LDL 84 ~  FLP 6/14 on Prav80+Feno160 showed TChol 69, TG 169, HDL 15, LDL 21  DIABETES MELLITUS (ICD-250.00) - on GLUCOVANCE 2.5/500- 1/2 Bid... he states his home BS checks have all been around 100 w/ occas nocturnal hypoglycemia> advised to take PM dose at dinner, not bedtime. ~   prev BS=177, HgA1c=7.0 in Oct08... ~  labs 2/09 showed BS=83, HgA1c=6.8 ~  labs 8/09 showed BS= 118, HgA1c=  7.3 ~  labs 9/10 showed BS= 90, A1c= 7.2 ~  labs 4/11 showed BS= 65, A1c= 6.9 ~  labs 2/13 showed BS= 76-171 ~  Labs 6/13 showed BS= 67, A1c=7.0 ~  12/13: on Glucovance2.5-500; labs today showed BS=134, A1c=7.5 and asked to incr Glucov to an extra 1/2 tab in PM... ~  6/14: on Glucovance2.5-500 (oneQam & 1/2Qpm); labs today showed BS=111, A1c=6.9 and asked to get on better low carb diet! ~  12/14: on Glucovance2.5-500 (oneQam & 1/2Qpm); labs today showed BS=126, A1c=6.9.Marland KitchenMarland Kitchen Continue same.  DIVERTICULOSIS OF COLON (ICD-562.10) - last colonoscopy was 10/02 by [facility] showing divertics only... prev polypectomy 1997 for tubular adenoma... overdue for f/u exam... COLONIC POLYPS (ICD-211.3) >>  NARROWING IN SIGMOID COLON w/ asymmetric thickening and elev CEA & abn hypermetabolism on PET scan >> ~  He saw [facility] 7/14 for GI f/u & felt to be hi risk for colonoscopy... ~  Virtual CT Colon was done 7/14 finding> stricture of the rectosigmoid w/ asymmetrical thickening of the rectosigmoid colon at 30 cm from the anal verge, worrisome for sigmoid colon carcinoma, 5 mm polyp at 8-10 cm from the anal verge, diverticulosis as well with multiple diverticula within the rectosigmoid colon; Probable noncalcified pleural plaques at both lung bases; Degenerative disc disease diffusely throughout the lumbar spine with anterolisthesis of L3 on L4 by 5 mm most likely degenerative...  ~  He was sent to [facility] 7/14> he rec surgery but he is obviously high risk=> he sent him to [facility] for clearance=> pt states that he made p his own mind against surg, BMs are better on metamucil daily. ~  12/14: pt ret to see me for routine f/u visit & we reviewed his data from 7/14> we decided to check CEA=5.3 and PET Scan= focal thickening & assoc narrowing of sigmoid colon w/ abnormal hypermetabolism (colon ca vs inflamm), stool in colon, atherosclerotic vasc dis, asbestos related pleural dis bilat...  pt understands options for 1)  proceed w/ colonoscopy & bx;  2) proceed w/ definitive surg approach;  3) continue watchful waiting w/ f/u CEA and CT Abd in ?44mo- he will need to be diligent about metamucil, miralax, etc top keep stools soft & moving so as to avoid constip/ obstruction thru the narrowed sigmoid region...   RENAL INSUFFICIENCY (ICD-588.9) - Creat in the 1.6-1.7 range... ~  labs 8/09 showed BUN= 14, Creat= 1.2 ~  labs 1/10 showed BUN= 31, Creat= 1.9 & ACE was held... f/u improved. ~  labs 9/10 showed BUN= 19, Creat= 1.5 ~  labs 4/11 showed BUN= 16, Creat= 1.4 ~  Labs 2/13 showed BUN= 27, Creat= 1.5 ~  Labs 6/13 showed BUN= 21, Creat= 1.4 ~  baseline Creat= 1.4-1.5; labs today reveals BUN=27 Creat=1,8 K=5.2; he is rec to decr the Lasix20 to just PRN for edema... ~  3/14:  DrTaylor increased the Lasix to 40mg /d... ~  6/14:  Labs showed K=5.6 BUN=35 Cr=1.6 and he is asked to decr the Lasix back to  1/2 tab daily (20mg )... ~  12/14:  Labs showed K=5.1, BUN=28, Cr=1.7  DEGENERATIVE JOINT DISEASE (ICD-715.90) ~  2/12:  Seen by DrWainer w/ bilat shoulder pain; XRays showed glenohumeral degen changes; given bilat injections w/ relief... ~  12/13:  He is c/o LBP> we rec refewrral to Ortho for further eval; Rx w/ rest, heat, TRAMADOL, ROBAXIN...  ANXIETY (ICD-300.00)  SEBORRHEIC DERMATITIS (ICD-690.10) - he has mod seb derm- discussed Rx w/ Selsun/ T-Gel/ Lotrisone Cream Prn...   Past Surgical History  Procedure Laterality Date  . Coronary artery bypass graft  1990's    CABG X4  . Tonsillectomy  ?1942  . Cardiac defibrillator placement  12/2004    single chamber defibrillator- Medtronic Maxima 8/06 DrKlein [Other][    Outpatient Encounter Prescriptions as of 05/09/2013  Medication Sig  . ADVAIR DISKUS 500-50 MCG/DOSE AEPB INHALE ONE DOSE BY MOUTH TWICE DAILY  . aspirin EC 325 MG tablet Take 325 mg by mouth every evening.  . carvedilol (COREG) 6.25 MG tablet Take 1 tablet (6.25 mg total) by mouth 2 (two) times  daily with a meal.  . enalapril (VASOTEC) 2.5 MG tablet Take 1 tablet (2.5 mg total) by mouth 2 (two) times daily.  . fenofibrate 160 MG tablet Take 1 tablet (160 mg total) by mouth daily.  . furosemide (LASIX) 20 MG tablet Take 1 tablet (20 mg total) by mouth as needed.  . glyBURIDE-metformin (GLUCOVANCE) 2.5-500 MG per tablet 1 tablet am, 1/2 tablet pm  . Guaifenesin (MUCINEX MAXIMUM STRENGTH) 1200 MG TB12 Take 1 tablet by mouth 2 (two) times daily.  . hydrocortisone (ANUSOL-HC) 2.5 % rectal cream Apply after each BM and QHS  . levalbuterol (XOPENEX HFA) 45 MCG/ACT inhaler Inhale 1-2 puffs into the lungs every 4 (four) hours as needed for wheezing or shortness of breath.  . mexiletine (MEXITIL) 150 MG capsule TAKE ONE CAPSULE BY MOUTH TWICE DAILY.  . pravastatin (PRAVACHOL) 80 MG tablet TAKE ONE TABLET BY MOUTH ONCE DAILY.  PT MUST KEEP UPCOMING APPOINTMENT TO RECEIVE FURTHER REFILLS.  . chlorhexidine (PERIDEX) 0.12 % solution     Allergies  Allergen Reactions  . Niacin Itching    Niaspan     Current Medications, Allergies, Past Medical History, Past Surgical History, Family History, and Social History were reviewed in [employer] record.   Review of Systems        See HPI - all other systems neg except as noted...       The patient complains of dyspnea on exertion.  The patient denies anorexia, fever, weight loss, weight gain, vision loss, decreased hearing, hoarseness, chest pain, syncope, peripheral edema, prolonged cough, headaches, hemoptysis, abdominal pain, melena, hematochezia, severe indigestion/heartburn, hematuria, incontinence, muscle weakness, suspicious skin lesions, transient blindness, difficulty walking, depression, unusual weight change, abnormal bleeding, enlarged lymph nodes, and angioedema.     Objective:   Physical Exam    WD, WN, 77 y/o WM in NAD... GENERAL:  Alert & oriented; pleasant & cooperative... HEENT:  /AT, EOM-wnl, PERRLA,  EACs-clear, TMs-wnl, NOSE-clear, THROAT-clear & wnl. NECK:  Supple w/ fairROM; no JVD; normal carotid impulses w/o bruits; no thyromegaly or nodules palpated; no lymphadenopathy. CHEST:  Few scat rhonchi without wheezing, rales, or signs of consolid... HEART:  Regular Rhythm;  gr1/6 SEM, without rubs or gallops heard... ABDOMEN:  Soft & nontender; normal bowel sounds; no organomegaly or masses detected. EXT: without deformities, mild arthritic changes; no varicose veins/ venous insuffic/ or edema. NEURO:  CN's intact; motor  testing normal; sensory testing normal; gait normal & balance OK. DERM:  No lesions noted; no rash etc...  RADIOLOGY DATA:  Reviewed in the EPIC EMR & discussed w/ the patient...  LABORATORY DATA:  Reviewed in the EPIC EMR & discussed w/ the patient...    Assessment:      RECTOSIGMOID NARROWING >> w/ CEA=5.3 (12/14) and Abn PET scan as above; difficult decision re: colonoscopy, surgery, watchful waiting=> [facility] &DrNewman need to weigh in... Pt favors watchful waiting.   COPD/ AB>  Stable on Advair, Xopenex, Mucinex; asked to take meds regularly & incr exercise...  HBP>  Controlled on meds, continue same, asked to monitor at home...  CAD>  S/p CABG, he is too sedentary & asked to incr exercise; no ischemia on Myoview 7/14...  CHF, Cardiomyopathy>  Followed by DrHochrein & Ladona Ridgel; continue meds...  Hx VTach>  Stable on Mexilitene & has AICD monitored by DrKlein/Taylor... Lead changed & new generator placed 12/13...  ASPVD>  He is s/p right SFA PTA & doing satis, but needs to incr exercise & continue ASA...  CHOL>  On Prav80 + Feno160; last FLP by DrHochrein 2/13 looked good x lowHDL needs incr exercise...  DM>  On diet + Glucovance; A1c is improved on 1.5 tabs per day...  Renal Insuffic>  Creat= 1.4-1.6 at baseline & 1.7 currently, we are decr the Lasix back to 20mg /d which is what the pt wants...  DJD>  Aware, stable on OTC meds...  Anxiety>  Stable & not  on anxiolytic Rx...     Plan:     Patient's Medications  New Prescriptions   No medications on file  Previous Medications   ADVAIR DISKUS 500-50 MCG/DOSE AEPB    INHALE ONE DOSE BY MOUTH TWICE DAILY   ASPIRIN EC 325 MG TABLET    Take 325 mg by mouth every evening.   CARVEDILOL (COREG) 6.25 MG TABLET    Take 1 tablet (6.25 mg total) by mouth 2 (two) times daily with a meal.   CHLORHEXIDINE (PERIDEX) 0.12 % SOLUTION       ENALAPRIL (VASOTEC) 2.5 MG TABLET    Take 1 tablet (2.5 mg total) by mouth 2 (two) times daily.   FENOFIBRATE 160 MG TABLET    Take 1 tablet (160 mg total) by mouth daily.   FUROSEMIDE (LASIX) 20 MG TABLET    Take 1 tablet (20 mg total) by mouth as needed.   GLYBURIDE-METFORMIN (GLUCOVANCE) 2.5-500 MG PER TABLET    1 tablet am, 1/2 tablet pm   GUAIFENESIN (MUCINEX MAXIMUM STRENGTH) 1200 MG TB12    Take 1 tablet by mouth 2 (two) times daily.   HYDROCORTISONE (ANUSOL-HC) 2.5 % RECTAL CREAM    Apply after each BM and QHS   LEVALBUTEROL (XOPENEX HFA) 45 MCG/ACT INHALER    Inhale 1-2 puffs into the lungs every 4 (four) hours as needed for wheezing or shortness of breath.   MEXILETINE (MEXITIL) 150 MG CAPSULE    TAKE ONE CAPSULE BY MOUTH TWICE DAILY.   PRAVASTATIN (PRAVACHOL) 80 MG TABLET    TAKE ONE TABLET BY MOUTH ONCE DAILY.  PT MUST KEEP UPCOMING APPOINTMENT TO RECEIVE FURTHER REFILLS.  Modified Medications   No medications on file  Discontinued Medications   No medications on file

## 2013-05-09 NOTE — Patient Instructions (Signed)
Today we updated your med list in our EPIC system...    Continue your current medications the same...  Today we rechecked your LABS... We will also sched a PET scan to further evaluate your colon dilemma...    We will contact you w/ the results when available...   Continue the Metamucil daily as it really seems to be helping...  Call for any questions...  Let's plan a follow up visit in 32mo, sooner if needed for problems.Marland KitchenMarland Kitchen

## 2013-05-20 ENCOUNTER — Encounter (HOSPITAL_COMMUNITY)
Admission: RE | Admit: 2013-05-20 | Discharge: 2013-05-20 | Disposition: A | Discharge status: Home / Self Care | Payer: Medicare Other | Source: Ambulatory Visit | Attending: Pulmonary Disease | Admitting: Pulmonary Disease

## 2013-05-20 DIAGNOSIS — K669 Disorder of peritoneum, unspecified: Secondary | ICD-10-CM | POA: Insufficient documentation

## 2013-05-20 DIAGNOSIS — I7090 Unspecified atherosclerosis: Secondary | ICD-10-CM | POA: Insufficient documentation

## 2013-05-20 DIAGNOSIS — J61 Pneumoconiosis due to asbestos and other mineral fibers: Secondary | ICD-10-CM | POA: Insufficient documentation

## 2013-05-20 DIAGNOSIS — C189 Malignant neoplasm of colon, unspecified: Secondary | ICD-10-CM | POA: Insufficient documentation

## 2013-05-20 MED ORDER — FLUDEOXYGLUCOSE F - 18 (FDG) INJECTION
18.7000 | Freq: Once | INTRAVENOUS | Status: AC | PRN
  Administered 2013-05-20: 18.7 via INTRAVENOUS

## 2013-05-26 DIAGNOSIS — K56699 Other intestinal obstruction unspecified as to partial versus complete obstruction: Secondary | ICD-10-CM

## 2013-05-26 HISTORY — DX: Other intestinal obstruction unspecified as to partial versus complete obstruction: K56.699

## 2013-06-02 ENCOUNTER — Encounter: Payer: Self-pay | Admitting: *Deleted

## 2013-06-08 ENCOUNTER — Other Ambulatory Visit: Payer: Self-pay | Admitting: Cardiology

## 2013-06-21 ENCOUNTER — Ambulatory Visit (INDEPENDENT_AMBULATORY_CARE_PROVIDER_SITE_OTHER): Payer: Medicare Other | Admitting: Internal Medicine

## 2013-06-21 ENCOUNTER — Encounter: Payer: Self-pay | Admitting: Internal Medicine

## 2013-06-21 VITALS — BP 110/58 | HR 80 | Ht 66.5 in | Wt 166.1 lb

## 2013-06-21 DIAGNOSIS — I472 Ventricular tachycardia: Secondary | ICD-10-CM

## 2013-06-21 DIAGNOSIS — I4729 Other ventricular tachycardia: Secondary | ICD-10-CM

## 2013-06-21 DIAGNOSIS — I2510 Atherosclerotic heart disease of native coronary artery without angina pectoris: Secondary | ICD-10-CM

## 2013-06-21 DIAGNOSIS — Z95810 Presence of automatic (implantable) cardiac defibrillator: Secondary | ICD-10-CM

## 2013-06-21 DIAGNOSIS — I5022 Chronic systolic (congestive) heart failure: Secondary | ICD-10-CM

## 2013-06-21 LAB — MDC_IDC_ENUM_SESS_TYPE_INCLINIC
Battery Remaining Longevity: 133 mo
Battery Voltage: 3.02 V
Date Time Interrogation Session: 20150127162302
HighPow Impedance: 209 Ohm
HighPow Impedance: 88 Ohm
Lead Channel Pacing Threshold Pulse Width: 0.4 ms
Lead Channel Sensing Intrinsic Amplitude: 29.875 mV
MDC IDC MSMT LEADCHNL RV IMPEDANCE VALUE: 399 Ohm
MDC IDC MSMT LEADCHNL RV PACING THRESHOLD AMPLITUDE: 0.75 V
MDC IDC MSMT LEADCHNL RV SENSING INTR AMPL: 19.25 mV
MDC IDC SET LEADCHNL RV PACING AMPLITUDE: 2 V
MDC IDC SET LEADCHNL RV PACING PULSEWIDTH: 0.4 ms
MDC IDC SET LEADCHNL RV SENSING SENSITIVITY: 0.3 mV
MDC IDC SET ZONE DETECTION INTERVAL: 300 ms
MDC IDC STAT BRADY RV PERCENT PACED: 0.25 %
Zone Setting Detection Interval: 430 ms
Zone Setting Detection Interval: 450 ms

## 2013-06-21 NOTE — Assessment & Plan Note (Signed)
His Medtronic ICD is working normally. We'll plan to recheck in several months. He is approximately 11 years of battery longevity.

## 2013-06-21 NOTE — Progress Notes (Signed)
HPI [name] returns today for followup. He is a very pleasant 78 year old man with an ischemic cardiomyopathy, ventricular tachycardia, chronic systolic heart failure, status post ICD implantation. In the interim, he has done well except he has continued heart failure symptoms, class IIb. The patient admits to dietary indiscretion. He likes to eat salt. He denies medical noncompliance. He has had no ICD shocks and denies chest pain or peripheral edema. Allergies  Allergen Reactions  . Niacin Itching    Niaspan      Current Outpatient Prescriptions  Medication Sig Dispense Refill  . ADVAIR DISKUS 500-50 MCG/DOSE AEPB INHALE ONE DOSE BY MOUTH TWICE DAILY  60 each  5  . aspirin EC 325 MG tablet Take 325 mg by mouth every evening.      . carvedilol (COREG) 6.25 MG tablet Take 1 tablet (6.25 mg total) by mouth 2 (two) times daily with a meal.  180 tablet  2  . enalapril (VASOTEC) 2.5 MG tablet Take 1 tablet (2.5 mg total) by mouth 2 (two) times daily.  180 tablet  2  . fenofibrate 160 MG tablet Take 1 tablet (160 mg total) by mouth daily.  90 tablet  3  . furosemide (LASIX) 20 MG tablet Take 1 tablet (20 mg total) by mouth as needed.  30 tablet  11  . glyBURIDE-metformin (GLUCOVANCE) 2.5-500 MG per tablet 1 tablet am, 1/2 tablet pm      . Guaifenesin (MUCINEX MAXIMUM STRENGTH) 1200 MG TB12 Take 1 tablet by mouth 2 (two) times daily.      Marland Kitchen mexiletine (MEXITIL) 150 MG capsule TAKE ONE CAPSULE BY MOUTH TWICE DAILY.  60 capsule  3  . pravastatin (PRAVACHOL) 80 MG tablet Take 1 tablet (80 mg total) by mouth daily.  30 tablet  0   No current facility-administered medications for this visit.     Past Medical History  Diagnosis Date  . VENTRICULAR TACHYCARDIA   . PERIPHERAL VASCULAR DISEASE   . HYPERTENSION   . HYPERCHOLESTEROLEMIA   . CORONARY ARTERY DISEASE   . Chronic systolic heart failure   . CAROTID ARTERY DISEASE   . SEBORRHEIC DERMATITIS   . RENAL INSUFFICIENCY   .  DIVERTICULOSIS OF COLON   . CPK, ABNORMAL   . COPD   . COLONIC POLYPS   . CANDIDIASIS, ORAL   . ASTHMATIC BRONCHITIS, ACUTE   . ANXIETY   . ICD (implantable cardiac defibrillator) in place   . Myocardial infarction ?1990's    "silent" (05/24/2012)  . Chronic bronchitis     "every year" (05/24/2012)  . Type II diabetes mellitus   . Chronic lower back pain   . DJD (degenerative joint disease)     "hands" (05/24/2012)  . Osteoarthritis of finger     ROS:   All systems reviewed and negative except as noted in the HPI.   Past Surgical History  Procedure Laterality Date  . Coronary artery bypass graft  1990's    CABG X4  . Tonsillectomy  ?1942  . Cardiac defibrillator placement  12/2004    single chamber defibrillator- [employer]onic Maxima 8/06 DrKlein [Other][     Family History  Problem Relation Age of Onset  . Heart attack Father   . Heart attack Brother      History   Social History  . Marital Status: Married    Spouse Name: N/A    Number of Children: N/A  . Years of Education: N/A   Occupational History  . Not on file.  Social History Main Topics  . Smoking status: Former Smoker -- 2.00 packs/day for 50 years    Types: Cigarettes    Quit date: 06/24/2000  . Smokeless tobacco: Never Used  . Alcohol Use: Yes     Comment: 05/24/2012 "used to drink alot; quit > 10 yr ago"  . Drug Use: No  . Sexual Activity: No   Other Topics Concern  . Not on file   Social History Narrative  . No narrative on file     BP 110/58  Pulse 80  Ht 5' 6.5" (1.689 m)  Wt 166 lb 1.9 oz (75.352 kg)  BMI 26.41 kg/m2  Physical Exam:  Well appearing 78 year old man,NAD HEENT: Unremarkable Neck:  7 cm JVD, no thyromegally Lungs:  Clear with no wheezes, rales, or rhonchi. HEART:  Regular rate rhythm, grade 2/6 systolic murmurs, no rubs, no clicks Abd:  soft, positive bowel sounds, no organomegally, no rebound, no guarding Ext:  2 plus pulses, no edema, no cyanosis, no  clubbing Skin:  No rashes no nodules Neuro:  CN II through XII intact, motor grossly intact  EKG - normal sinus rhythm with nonspecific intraventricular conduction delay, QRS duration 140 ms.  DEVICE  Normal device function.  See PaceArt for details.   Assess/Plan:

## 2013-06-21 NOTE — Patient Instructions (Signed)
Your physician wants you to follow-up in: 12 months with Dr Knox Saliva will receive a reminder letter in the mail two months in advance. If you don't receive a letter, please call our office to schedule the follow-up appointment.    Remote monitoring is used to monitor your Pacemaker or ICD from home. This monitoring reduces the number of office visits required to check your device to one time per year. It allows [place] to keep an eye on the functioning of your device to ensure it is working properly. You are scheduled for a device check from home on 09/22/13. You may send your transmission at any time that day. If you have a wireless device, the transmission will be sent automatically. After your physician reviews your transmission, you will receive a postcard with your next transmission date.

## 2013-06-21 NOTE — Assessment & Plan Note (Signed)
He denies anginal symptoms. I've encouraged the patient to increase his physical activity. No change in medications.

## 2013-06-21 NOTE — Assessment & Plan Note (Signed)
His ventricular tachycardia has remained quiet. He'll continue his current medical therapy with beta blockers and mexiletine.

## 2013-07-06 ENCOUNTER — Other Ambulatory Visit: Payer: Self-pay | Admitting: Cardiology

## 2013-07-25 ENCOUNTER — Other Ambulatory Visit: Payer: Self-pay | Admitting: Pulmonary Disease

## 2013-07-25 ENCOUNTER — Other Ambulatory Visit: Payer: Self-pay | Admitting: Internal Medicine

## 2013-08-07 ENCOUNTER — Other Ambulatory Visit: Payer: Self-pay | Admitting: Pulmonary Disease

## 2013-08-07 ENCOUNTER — Other Ambulatory Visit: Payer: Self-pay | Admitting: Cardiology

## 2013-08-11 NOTE — Telephone Encounter (Signed)
Called spoke with pt. He has been giving # to [facility] to schedule appt for pt.  Pt will cont to see pt for pulmonary reasons. Nothing further needed

## 2013-08-31 ENCOUNTER — Ambulatory Visit (INDEPENDENT_AMBULATORY_CARE_PROVIDER_SITE_OTHER)
Admission: RE | Admit: 2013-08-31 | Discharge: 2013-08-31 | Disposition: A | Discharge status: Home / Self Care | Payer: Medicare Other | Source: Ambulatory Visit | Attending: Pulmonary Disease | Admitting: Pulmonary Disease

## 2013-08-31 ENCOUNTER — Encounter: Payer: Self-pay | Admitting: Pulmonary Disease

## 2013-08-31 ENCOUNTER — Ambulatory Visit (INDEPENDENT_AMBULATORY_CARE_PROVIDER_SITE_OTHER): Payer: Medicare Other | Admitting: Pulmonary Disease

## 2013-08-31 ENCOUNTER — Other Ambulatory Visit (INDEPENDENT_AMBULATORY_CARE_PROVIDER_SITE_OTHER): Payer: Medicare Other

## 2013-08-31 VITALS — BP 106/60 | HR 86 | Temp 97.6°F | Ht 66.5 in | Wt 168.6 lb

## 2013-08-31 DIAGNOSIS — I5022 Chronic systolic (congestive) heart failure: Secondary | ICD-10-CM

## 2013-08-31 DIAGNOSIS — I6529 Occlusion and stenosis of unspecified carotid artery: Secondary | ICD-10-CM

## 2013-08-31 DIAGNOSIS — F411 Generalized anxiety disorder: Secondary | ICD-10-CM

## 2013-08-31 DIAGNOSIS — R0600 Dyspnea, unspecified: Secondary | ICD-10-CM

## 2013-08-31 DIAGNOSIS — I472 Ventricular tachycardia: Secondary | ICD-10-CM

## 2013-08-31 DIAGNOSIS — J449 Chronic obstructive pulmonary disease, unspecified: Secondary | ICD-10-CM

## 2013-08-31 DIAGNOSIS — R0609 Other forms of dyspnea: Secondary | ICD-10-CM

## 2013-08-31 DIAGNOSIS — N259 Disorder resulting from impaired renal tubular function, unspecified: Secondary | ICD-10-CM

## 2013-08-31 DIAGNOSIS — R0989 Other specified symptoms and signs involving the circulatory and respiratory systems: Secondary | ICD-10-CM

## 2013-08-31 DIAGNOSIS — I10 Essential (primary) hypertension: Secondary | ICD-10-CM

## 2013-08-31 DIAGNOSIS — K56609 Unspecified intestinal obstruction, unspecified as to partial versus complete obstruction: Secondary | ICD-10-CM

## 2013-08-31 DIAGNOSIS — Z95810 Presence of automatic (implantable) cardiac defibrillator: Secondary | ICD-10-CM

## 2013-08-31 DIAGNOSIS — E7800 Pure hypercholesterolemia, unspecified: Secondary | ICD-10-CM

## 2013-08-31 DIAGNOSIS — K56699 Other intestinal obstruction unspecified as to partial versus complete obstruction: Secondary | ICD-10-CM

## 2013-08-31 DIAGNOSIS — K5730 Diverticulosis of large intestine without perforation or abscess without bleeding: Secondary | ICD-10-CM

## 2013-08-31 DIAGNOSIS — M545 Low back pain, unspecified: Secondary | ICD-10-CM

## 2013-08-31 DIAGNOSIS — R091 Pleurisy: Secondary | ICD-10-CM

## 2013-08-31 DIAGNOSIS — I739 Peripheral vascular disease, unspecified: Secondary | ICD-10-CM

## 2013-08-31 DIAGNOSIS — J929 Pleural plaque without asbestos: Secondary | ICD-10-CM | POA: Insufficient documentation

## 2013-08-31 DIAGNOSIS — I4729 Other ventricular tachycardia: Secondary | ICD-10-CM

## 2013-08-31 DIAGNOSIS — I2510 Atherosclerotic heart disease of native coronary artery without angina pectoris: Secondary | ICD-10-CM

## 2013-08-31 DIAGNOSIS — E119 Type 2 diabetes mellitus without complications: Secondary | ICD-10-CM

## 2013-08-31 DIAGNOSIS — M1990 Unspecified osteoarthritis, unspecified site: Secondary | ICD-10-CM

## 2013-08-31 LAB — CBC WITH DIFFERENTIAL/PLATELET
BASOS ABS: 0 10*3/uL (ref 0.0–0.1)
Basophils Relative: 0.5 % (ref 0.0–3.0)
Eosinophils Absolute: 0.1 10*3/uL (ref 0.0–0.7)
Eosinophils Relative: 1.5 % (ref 0.0–5.0)
HEMATOCRIT: 40.6 % (ref 39.0–52.0)
HEMOGLOBIN: 13.7 g/dL (ref 13.0–17.0)
LYMPHS ABS: 2.3 10*3/uL (ref 0.7–4.0)
Lymphocytes Relative: 26.8 % (ref 12.0–46.0)
MCHC: 33.6 g/dL (ref 30.0–36.0)
MCV: 88.8 fl (ref 78.0–100.0)
MONO ABS: 0.9 10*3/uL (ref 0.1–1.0)
Monocytes Relative: 10.4 % (ref 3.0–12.0)
Neutro Abs: 5.2 10*3/uL (ref 1.4–7.7)
Neutrophils Relative %: 60.8 % (ref 43.0–77.0)
PLATELETS: 305 10*3/uL (ref 150.0–400.0)
RBC: 4.57 Mil/uL (ref 4.22–5.81)
RDW: 14.6 % (ref 11.5–14.6)
WBC: 8.5 10*3/uL (ref 4.5–10.5)

## 2013-08-31 LAB — BASIC METABOLIC PANEL
BUN: 27 mg/dL — ABNORMAL HIGH (ref 6–23)
CHLORIDE: 96 meq/L (ref 96–112)
CO2: 30 mEq/L (ref 19–32)
Calcium: 9.6 mg/dL (ref 8.4–10.5)
Creatinine, Ser: 1.6 mg/dL — ABNORMAL HIGH (ref 0.4–1.5)
GFR: 45.54 mL/min — AB (ref >60.00)
Glucose, Bld: 71 mg/dL (ref 70–99)
POTASSIUM: 5.2 meq/L — AB (ref 3.5–5.1)
SODIUM: 137 meq/L (ref 135–145)

## 2013-08-31 LAB — HEMOGLOBIN A1C: Hgb A1c MFr Bld: 7.1 % — ABNORMAL HIGH (ref 4.6–6.5)

## 2013-08-31 LAB — BRAIN NATRIURETIC PEPTIDE: PRO B NATRI PEPTIDE: 230 pg/mL — AB (ref 0.0–100.0)

## 2013-08-31 MED ORDER — PREDNISONE 20 MG PO TABS: ORAL_TABLET | ORAL | Status: DC

## 2013-08-31 MED ORDER — TIOTROPIUM BROMIDE MONOHYDRATE 18 MCG IN CAPS: 18.0000 ug | ORAL_CAPSULE | Freq: Every day | RESPIRATORY_TRACT | Status: DC

## 2013-08-31 MED ORDER — AMOXICILLIN-POT CLAVULANATE 875-125 MG PO TABS: 1.0000 | ORAL_TABLET | Freq: Two times a day (BID) | ORAL | Status: DC

## 2013-08-31 NOTE — Progress Notes (Signed)
Subjective:     Patient ID: [name], male   DOB: April 06, 1936, 78 y.o.   MRN: [id number]  HPI 78 y/o WM here for a follow up visit... he has multiple medical problems as noted below...   ~  May 04, 2012:  31moROV & [name] notes some incr congestion & cough, he had resp exac 11/13 treated by TP w/ Augmentin & Pred- improved;  He is also c/o LBP x several weeks, pain is incr w/ movement, not radiating to his legs- he is rec to rest, apply heat, & try Tramadol & Robaxin Tid, we will also set up referral to Ortho- [name]...  We reviewed the following medical problems during today's office visit>>     [name], COPD> on Advair500Bid, Xopenex prn, Mucinex-2Bid, off Pred; recent exac treated w/ Augmentin & Pred- improved...    HBP> on Coreg12.5Bid, Vasotec5Bid, Lasix20; BP= 100/58 & he denies CP, palpit, ch in SOB/DOE, edema, etc...    CAD> on ASTM196 Hx Lmain + 3vessel dis & CABGx4 in 2003; f/u cath 2008 w/ atretic LIMA to LAD;     CHF, Ischemic Cardiomyop> EF= 35-40% by 2DEcho 2/13; he continues regular f/u w/ [name]...    Hx VTach, AICD> on Mexiletine150Bid; he is followed by [name] & stable on Mexiletine w/o AICD firing; new AICD generator needed soon...    ASPVDz> on ASA325; Hx PTA to right SFA for ABI=0.36 in past; not active enough for claud; known plaque in Carotids w/o signif flow reduction...    CHOL> on Prav80, Feno160; last FLP 2/13 showed TChol 136, TG 105, HDL 31, LDL 84    DM> on Glucovance2.5-500; labs today showed BS=134, A1c=7.5 and asked to incr Glucov to an extra 1/2 tab in PM...    GI- Divertics, Polyps> hx adenomatous polyp in 1997, last colonoscopy 2002 by [name]/ divertics only; he is overdue for f/u colon but declines to sched f/u appt...    Renal Insuffic> baseline Creat= 1.4-1.5; labs today reveals BUN=27, Creat=1,8, K=5.2; he is rec to decr the Lasix20 to just PRN for edema...    DJD> followed by [name] w/ shots in shoulders in the past; now c/o LBP &  we will Rx w/ rest, heat, Tramadol, Robaxin & refer to [name]...    Anxiety> aware- he is not on an anxiolytic med by his pref... We reviewed prob list, meds, xrays and labs> see below for updates >>  LABS 12/13 (not fasting):  Chems- BS=158, A1c=7.5, BUN=27, Creat=1.8, K=5.2    ~  November 15, 2012:  644moOV & [name] c/o constipation, plus he is long overdue for f/u colon w/ hx adenomatous polyps; we decided to check KUB XRay=> air & stool scat about the colon but stool burden NOT substantially increased; we rec MIRALAX daily  & refer to GI as noted...  We reviewed the following medical problems during today's office visit >>     [name], COPD> on Advair500Bid, Xopenex prn, Mucinex-2Bid, off Pred; prev exac treated w/ Augmentin & Pred- improved; now stable w/ min cough, stable DOE etc...    HBP> on Coreg12.5Bid, Vasotec5Bid, Lasix40; BP= 120/70 & he feels the Lasix is too strong- labs show BUN=35 Cr=1.6 therefore rec decr to 1/2 tab daily (2063m..     CAD> on ASA325; Hx Lmain + 3vessel dis & CABGx4 in 2003; f/u cath 2008 w/ atretic LIMA to LAD; he denies CP, palpit, dizzy, ch in SOB, edema...    CHF, Ischemic Cardiomyop> EF= 35-40% by 2DEcho  2/13; he continues regular f/u w/ [name]...    Hx VTach, AICD> on Mexiletine150Bid; he is followed by [name]/[name] & stable on Mexiletine- generator changed 12/13, seen 3/14 & stable, they incr Lasix to 40 but he feels it's too strong.    ASPVDz> on ASA325; Hx PTA to right SFA for ABI=0.36 in past; not active enough for claud; known plaque in Carotids w/o signif flow reduction...    CHOL> on Prav80, Feno160; FLP 6/14 showed TChol 69, TG 169, HDL 15, LDL 21; rec to continue meds better low fat diet, incr exerc...    DM> on Glucovance2.5-500 (oneQam & 1/2Qpm); labs today showed BS=111, A1c=6.9 and asked to get on better low carb diet!     GI- Divertics, Polyps> hx adenomatous polyp in 1997, last colonoscopy 2002 by [name] w/ divertics only; he is overdue for  f/u colon & c/o constip=> KUB w/o impaction, start Miralax, refer to GI...    Renal Insuffic> baseline Creat= 1.4-1.5; labs today reveals BUN=35, Creat=1,6, K=5.6; he is rec to decr the Lasix40 back to 16m/d...    DJD> followed by [name] w/ shots in shoulders in the past; now c/o LBP & we will Rx w/ rest, heat, Tramadol, Robaxin & refer to [name]...    Anxiety> aware- he is not on an anxiolytic med by his pref... We reviewed prob list, meds, xrays and labs> see below for updates >>  KUB XRay 6/14 showed air & stool scat about the colon but stool burden NOT substantially increased, DJD in TSpine...  LABS 6/14:  FLP- TChol&LDL are ok but HDL=15, TG=169;  Chems- showed BS=111 A1c=6.9, K=5.6 BUN=35 Cr=1.6;  CBC- ok;  TSH=2.10;  PSA=0.45...  ~  May 09, 2013:  685moOV & RoTrewaw [name]/14 for GI f/u & felt to be hi risk for colonoscopy; Virtual CT Colon was done 7/14 finding> stricture of the rectosigmoid w/ asymmetrical thickening of the rectosigmoid colon at 30 cm from the anal verge, worrisome for sigmoid colon carcinoma, 5 mm polyp at 8-10 cm from the anal verge, diverticulosis as well with multiple diverticula within the rectosigmoid colon; Probable noncalcified pleural plaques at both lung bases; Degenerative disc disease diffusely throughout the lumbar spine with anterolisthesis of L3 on L4 by 5 mm most likely degenerative... He was sent to [name]/14> he rec surgery but he is obviously high risk he sent him to [name] for clearance>>    He saw [name] last 3/14> ischemic cardiomyop, chr sys CHF, Hx VT on Mexiletine150Bid & s/p ICD placement; norm device function, no shocks, Lasix incr to 4080m, no salt etc...     He saw [name] 7/14> CAD (on ASA325), ischemic cardiomyop, denies CP/ palpit, but weak & SOB, BP was low after virtual colon w/ diarrhea; he stopped his Lasix & decr Coreg to 6.25Bid and Lisin to 2.5Bid...     They did Myoview 8/14> LBBB on EKG, medium sized fixed infer  wall defect c/w old IWMI, no reversible ischemia noted, EF=34% w/ infer wall HK...     [facility] & [facility] discussed [facility]> pt states that he made up his own mind against surg, BMs are better on metamucil daily We reviewed prob list, meds, xrays and labs> see below for updates >> Given 2014 Flu vaccine today...  LABS 12/14:  Chems ok x BS=129, A1c=6.9, Cr=1.7;  LFTs are now normal off all Etoh;  CBC- ok w/ Hg=13.7, Fe=75 (15%);  CEA=5.3... Marland KitchenMarland KitchenT Scan => done 12/14 => focal  thickening & assoc narrowing of sigmoid colon w/ abnormal hypermetabolism (colon ca vs inflamm), stool in colon, atherosclerotic vasc dis, asbestos related pleural dis bilat...  ADDENDUM>> pt understands options for 1) proceed w/ colonoscopy & bx;  2) proceed w/ definitive surg approach;  3) continue watchful waiting w/ f/u CEA and CT Abd in ?55mo he will need to be diligent about metamucil, miralax, etc to keep stools soft & moving so as to avoid constip/ obstruction thru the narrowed sigmoid region...   ~  August 31, 2013:  483moOV & RoOsies here for follow up- he has done well from the GI standpoint, denies abd pain, n/v, c/d, blood seen; he uses metamucil & states his BMs are normal, no issues; we reviewed his prev work up & we will recheck CBC (wnl) & CEA level (up to 8.0) today... His CC today is no energy, decr stamina, and DOE that seems worse than before- eg taking out the garbage etc but he readily admits NOT exercising; also notes no CP, palpit, edema; he saw [name] for [name]/EP 1/15> ischemic CM, hx VT, chr sysCHF w/ EF in the 35% range, s/pICD- everything stable, no shocks, told to avoid salt, same meds, encouraged exercise...  He does note some cough, sm amt of brown sput, denies f/c/s, no CP, etc... He is taking his Advair500Bid & Mucinex Bid; exam w/ decr BS but clear; see labs & studies below; we decided to treat by adding Spiriva, and a course of Augmentin & Pred...     We reviewed prob list, meds, xrays  and labs> see below for updates >>   CXR 4/15 showed stable heart size, pacer, s/p CABG, COPD changes- NAD, calcif pleural plaques stable...  PFT 4/15 showed FVC=2.27 (59%), FEV1=0.69 (24%), %1sec=31, mid-flows=7% predicted... This is c/w GOLD Stage4 COPD...   LABS 4/15:  Chems- ok w/ BS=71 A1c=7.1 BUN=27 Cr=1.6 (stable);  CBC- wnl;  BNP=230;  CEA=8.0 (up from 5.3)       ADDENDUM>> I had a long discussion w/ pt; again reviewed his situation (I told him that I was very concerned that this represented colon cancer) and proposed the same 3 options (aggressive, middle-of-the-road, conservative) w/ surg f/u DrNewman, colonoscopy w/ bx, continued watchful waiting w/ another CEA + f/u CT Abd in 3-71m2moe didn't hesitate in his desire for conserv approach & will f/u in 3-71mo82modiscussed; he will call for any questions in the interim; he does not want another opinion or f/u w/ his specialists at this time...          Problem List:  COPD (ICD-MEQ-683 ex-smoker, quit 2002...  Hx ASTHMATIC BRONCHITIS >> occas acute infectious exacerbations... ~  12/10: had bronchitic exac Rx'd w/ Avelox, Pred, Mucinex, etc & improved==> CXR showed COPD/E, incr interstitial markings & scarring, calcif pleural plaques, enlarged heart & pacer, NAD...  Marland Kitchen  7/11:  similar episode, responded to similar meds... ~  CXR 6/13 showed stable heart size s/p CABG, pacer, hyperinflated/ clear lungs w/ interstitial prominence, DJD in TSpine... ~  11/13: similar resp exac treated by TP w/ Augmentin & Pred- improved... ~  CXR 11/13 showed cardiomeg & prev CABG, left subclav AICD, atherosclerotic calcif of Ao, calcif pleural plaques, emphysema w/o acute changes... ~  12/14: on Advair500Bid, XopenexHFA prn, GFN1200Bid; breathing is stable w/o recent exac... ~  4/15: on Advair500Bid, XopenexHFA prn, Mucinex1200Bid; presents w/ incr dyspnea, no energy, no stamina, notes some cough/ brown phlegm, no hemoptysis; we added Spiriva daily +  a course  of Augmentin & Pred for 10d; really needs rehab/exercise... ~  CXR 4/15 showed stable heart size, pacer, s/p CABG, COPD changes- NAD, calcif pleural plaques stable... ~  PFT 4/15 showed FVC=2.27 (59%), FEV1=0.69 (24%), %1sec=31, mid-flows=7% predicted... This is c/w GOLD Stage4 COPD.  HYPERTENSION (ICD-401.9) - meds for BP & his Chr CHF= COREG 12.55mBid,  ENALAPRIL 523mid, & LASIX 2047m...   ~  labs 1/10 showed K= 5.6, BUN= 31, Creat= 1.9... AMarland KitchenE/ ARB stopped... pt didn't follow up. ~  8/10: Coreg + Enalapril restarted by [name]... ~  labs 9/10 showed K=5.6, BUN=19, Creat=1.5 ~  labs 12/10 showed K=4.4, BUN= 32, Creat= 1.7, BNP= 102 ~  labs 4/11 showed K=5.1, BUN=16, Creat=1.4 ~  10/11: BP=122/68 today, and feeling well- he denies HA, fatigue, visual changes, CP, palipit, dizziness, syncope, edema, etc. ~  labs 2/13 showed K=5.7, BUN=27, Creat=1.5 ~  6/13: BP= 112/62 and he denies CP, palpit, dizzy, syncope, ch in SOB, edema... ~  Labs 6/13 showed K=5.5, BUN=21, Creat=1.4 ~  12/13: on Coreg12.5Bid, Vasotec5Bid, Lasix20; BP= 100/58 & he denies CP, palpit, ch in SOB/DOE, edema, etc; we cut the LASIX to 35m68mn swelling due to Creat=1.8 ~  6/14: on Coreg12.5Bid, Vasotec5Bid, Lasix40; BP= 120/70 & he feels the Lasix is too strong- labs show BUN=35 Cr=1.6 therefore rec decr to 1/2 tab daily (35mg70m~  12/14: on Coreg6.25Bid, Vasotec2.5Bid, Lasix20; BP= 108/62 & he is feeling well & denies CP, palpit, ch in dyspnea/ edema/ etc... ~  4/15: on Coreg 6.25Bid, Vasotec2.5Bid, Lasix40;  BP= 110/60 but he is feeling weak, tired, no stamina, no energy w/ DOE sl worse; he has not been exercising & will try to slowly incr his exercise program...  CORONARY ARTERY DISEASE (ICD-414.00) - no chest pains, no palpit, no defib discharges... ~ Hx 3 vessel CAD w/ CABG x4 in 2/03 for LMain dis & 3vessel dis + mod LVD EF=35% at that time... ~ Cath 7/06 w/ non-filling of SVG to PDA, & LIMA to LAD was atretic, w/  EF=45%... ~ Cath 10/08 w/ norm SVG to PDA, & atretic LIMA to LAD (due to good native vessel flow), EF=40%... note is made of a 40-50% LMain lesion... ~  EKG 1/13 showed SBrady, rightward axis, IVCD, NSSTTWA... ~  EKG 7/14 showed NSR, rate65, LBBB, T-wave abn... ~  Myoview 7/14> LBBB on EKG, medium sized fixed infer wall defect c/w old IWMI, no reversible ischemia noted, EF=34% w/ infer wall HK...   CONGESTIVE HEART FAILURE, SYSTOLIC, CHRONIC (ICD-4TDD-220.2schemic cardiomyopathy & meds as above... followed by [name] in the CHF clinic...  see labs above>> ~  seen 8/10 in CHF clinic- Coreg + Enalapril restarted, labs reviewed. ~  2DEcho 2/13 showed LV mildly dilated w/ diffuse HK, incr wall thickness c/w mild LVH, EF=35-40%, mild MR, mild LAdil... ~  Labs 6/13 showed Creat=1.4 & BNP=83 ~  Labs 12/13 showed Creat=1.8 and his Lasix20 was cut to just prn for swelling... ~  3/14: [name] increased his Lasix to 40mg/52mt feels this is too strong for him... ~  6/14: Labs showed K=5.6 BUN=35 Cr=1.6 and he is asked to decr the Lasix back to 1/2 tab daily (35mg).57m  7/14: He saw [name]> CAD (on ASA325), ischemic cardiomyop, denies CP/ palpit, but weak & SOB, BP was low after virtual colon w/ diarrhea; he stopped his Lasix & decr Coreg to 6.25Bid and Lisin to 2.5Bid. ~  4/15:  He remains on ASA325, & Mexiletine150Bid per  [name] & Hochrein> denies CP, palpit, edema- just c/o incr DOE, BNP=230 on the Lasix40 & reminded to elim sodium...  Hx of VENTRICULAR TACHYCARDIA (ICD-427.1) - prev documented medication non-compliance w/ Amio & Mexilitene... [name] has adjusted his meds... ~ hx VTach storm w/ AICD placed, followed by [name]/Klein on meds... ~ [place] 10/08 on Amiodarone 219m- 2 tabs twice daily, and MEXILETINE 1519mid... subseq decr Amio 20044md... ~ f/u w/ [name] 9/09 w/ defib check- doing well... ~  f/u 9/10 w/ [name] on Mexilitene alone, Amio stopped for intolerance... doing well-  no changes made. ~  f/u 1/13 w/ DrAllred> stable on Mexilitene, & no changes made... ~  12/13: [name] fixed one lead & changed the AICD generator, he remains stable on the Mexilitene... ~  3/14: He saw [name]> ischemic cardiomyop, chr sys CHF, Hx VT on Mexiletine150Bid & s/p ICD placement; norm device function, no shocks, Lasix incr to 33m71m no salt etc.  PERIPHERAL VASCULAR DISEASE (ICD-443.9) >> on ASA 325mg33m. ~ s/p PTA to right SFA for an ABI of 0.36...  ~  CDopplers 10/09 showed bilat carotid disease w/ bruits and mod plaque in bulbs, 0-39% bilat... no change. ~  CDopplers 11/11 showed mod heterogeneous plaque in bulbs, 0-39% bilat ICA stenoses, f/u 59yrs.16yrHYPERCHOLESTEROLEMIA (ICD-272.0) - prev on Simvastatin 33mg/d35md changed to PRAVASTATIN 80mg/d 56mDrHochrein (due to interaction of Simva & [place]RATE 160mg/d a29m 8/09... ~  FLP 8/08 [place] showed TChol 136, TG 297, HDL 22, LDL 69- but he was ?not taking meds regularly? he freq forgets. ~  FLP 8/09 [place] showed TChol 124, TG 257, HDL 24, LDL 65... rec to add Fenofibrate 160/d... ~  FLP 9/10 on Prav80+Feno160 showed TChol 137, TG 142, HDL 26, LDL 83... continue both meds. ~  FLP 4/11 on Prav80+Feno160 showed TChol 131, TG 139, HDL 33, LDL 70 ~  FLP 10/11 = pt didn't go to the lab for blood work... ~  FLP 2/13 [place]Feno[place] showed TChol 136, TG 105, HDL 31, LDL 84 ~  FLP 6/14 on Prav80+Feno160 showed TChol 69, TG 169, HDL 15, LDL 21  DIABETES MELLITUS (ICD-250.00) - on GLUCOVANCE 2.5/500- 1/2 Bid... he states his home BS checks have all been around 100 w/ occas nocturnal hypoglycemia> advised to take PM dose at dinner, not bedtime. ~   prev BS=177, HgA1c=7.0 in Oct08... ~  labs 2/09 showed BS=83, HgA1c=6.8 ~  labs 8/09 showed BS= 118, HgA1c= 7.3 ~  labs 9/10 showed BS= 90, A1c= 7.2 ~  labs 4/11 showed BS= 65, A1c= 6.9 ~  labs 2/13 showed BS= 76-171 ~  Labs 6/13 showed BS= 67, A1c=7.0 ~  12/13: on  Glucovance2.5-500; labs today showed BS=134, A1c=7.5 and asked to incr Glucov to an extra 1/2 tab in PM... ~  6/14: on Glucovance2.5-500 (oneQam & 1/2Qpm); labs today showed BS=111, A1c=6.9 and asked to get on better low carb diet! ~  12/14: on Glucovance2.5-500 (oneQam & 1/2Qpm); labs today showed BS=126, A1c=6.9... ContinMarland KitchenMarland Kitchen same. ~  4/15:  On Glucovance2.5-500 (oneQAM & 1/2QPM)l labs today showed BS=71, A1c=7.1  DIVERTICULOSIS OF COLON (ICD-562.10) - last colonoscopy was 10[facility]02 [facility]... prev polypectomy 1997 for tubular adenoma... overdue for f/u exam... COLONIC POLYPS (ICD-211.3) >>  NARROWING IN SIGMOID COLON w/ asymmetric thickening and elev CEA & abn hypermetabolism on PET scan >> ~  [name] & felt to be hi risk for colonoscopy... ~  Virtual CT Colon was done  7/14 finding> stricture of the rectosigmoid w/ asymmetrical thickening of the rectosigmoid colon at 30 cm from the anal verge, worrisome for sigmoid colon carcinoma, 5 mm polyp at 8-10 cm from the anal verge, diverticulosis as well with multiple diverticula within the rectosigmoid colon; Probable noncalcified pleural plaques at both lung bases; Degenerative disc disease diffusely throughout the lumbar spine with anterolisthesis of L3 on L4 by 5 mm most likely degenerative...  ~  He was [facility] to [name]> he rec surgery but he is obviously high risk=> he sent him to [name] for clearance=> pt states that he made p his own mind against surg, BMs are better on metamucil daily. ~  12/14: pt ret to see me for routine f/u visit & we reviewed his data from 7/14> we decided to check CEA=5.3 and PET Scan= focal thickening & assoc narrowing of sigmoid colon w/ abnormal hypermetabolism (colon ca vs inflamm), stool in colon, atherosclerotic vasc dis, asbestos related pleural dis bilat...  pt understands options for 1) proceed w/ colonoscopy & bx;  2) proceed w/ definitive surg approach;  3) continue  watchful waiting w/ f/u CEA and CT Abd in ?48mo he will need to be diligent about metamucil, miralax, etc top keep stools soft & moving so as to avoid constip/ obstruction thru the narrowed sigmoid region...  ~  4/15: he has done well from the GI standpoint, denies abd pain, n/v, c/d, blood seen; he uses metamucil & states his BMs are normal, no issues; we reviewed his prev work up & rechecked CBC (wnl) & CEA level (up to 8.0) today...  I had a long discussion w/ pt & told him that I was very concerned that this represented colon cancer and proposed the same 3 options (aggressive, middle-of-the-road, conservative) w/ surg f/u DrNewman, colonoscopy w/ bx, continued watchful waiting w/ another CEA + f/u CT Abd in 3-447mohe didn't hesitate in his desire for conserv approach & will f/u in 3-38m29mo discussed; he will call for any questions in the interim; he does not want another opinion or f/u w/ his specialists at this time...  RENAL INSUFFICIENCY (ICD-588.9) - Creat in the 1.6-1.7 range... ~  labs 8/09 showed BUN= 14, Creat= 1.2 ~  labs 1/10 showed BUN= 31, Creat= 1.9 & ACE was held... f/u improved. ~  labs 9/10 showed BUN= 19, Creat= 1.5 ~  labs 4/11 showed BUN= 16, Creat= 1.4 ~  Labs 2/13 showed BUN= 27, Creat= 1.5 ~  Labs 6/13 showed BUN= 21, Creat= 1.4 ~  baseline Creat= 1.4-1.5; labs today reveals BUN=27 Creat=1,8 K=5.2; he is rec to decr the Lasix20 to just PRN for edema... ~  3/14:  [name] increased the Lasix to 35m738m.. ~  6/14:  Labs showed K=5.6 BUN=35 Cr=1.6 and he is asked to decr the Lasix back to 1/2 tab daily (20mg33m ~  12/14:  Labs showed K=5.1, BUN=28, Cr=1.7 ~  4/15::  Labs  Showed BUN=27, Cr=1.6  DEGENERATIVE JOINT DISEASE (ICD-715.90) ~  2/12:  Seen by [name] w/ bilat shoulder pain; XRays showed glenohumeral degen changes; given bilat injections w/ relief... ~  12/13:  He is c/o LBP> we rec refewrral to Ortho for further eval; Rx w/ rest, heat, TRAMADOL,  ROBAXIN...  ANXIETY (ICD-300.00)  SEBORRHEIC DERMATITIS (ICD-690.10) - he has mod seb derm- discussed Rx w/ Selsun/ T-Gel/ Lotrisone Cream Prn...   Past Surgical History  Procedure Laterality Date  . Coronary artery bypass graft  1990's    CABG X4  .  Tonsillectomy  ?1942  . Cardiac defibrillator placement  12/2004    single chamber defibrillator- Medtronic Maxima 8/06 [name] [Other][    Outpatient Encounter Prescriptions as of 08/31/2013  Medication Sig  . ADVAIR DISKUS 500-50 MCG/DOSE AEPB INHALE ONE DOSE BY MOUTH TWICE DAILY  . aspirin EC 325 MG tablet 1/2 tablet daily x 6 days  1 tablet daily x 1 day  . carvedilol (COREG) 6.25 MG tablet Take 1 tablet (6.25 mg total) by mouth 2 (two) times daily with a meal.  . enalapril (VASOTEC) 2.5 MG tablet Take 1 tablet (2.5 mg total) by mouth 2 (two) times daily.  . fenofibrate 160 MG tablet Take 1 tablet (160 mg total) by mouth daily.  . furosemide (LASIX) 40 MG tablet Take 40 mg by mouth daily.  Marland Kitchen glyBURIDE-metformin (GLUCOVANCE) 2.5-500 MG per tablet TAKE ONE TABLET BY MOUTH IN THE MORNING AND  1/2 TABLET  AT DINNER  . Guaifenesin (MUCINEX MAXIMUM STRENGTH) 1200 MG TB12 Take 1 tablet by mouth 2 (two) times daily.  Marland Kitchen mexiletine (MEXITIL) 150 MG capsule TAKE ONE CAPSULE BY MOUTH TWICE DAILY  . pravastatin (PRAVACHOL) 80 MG tablet TAKE ONE TABLET BY MOUTH ONCE DAILY  . [DISCONTINUED] ADVAIR DISKUS 500-50 MCG/DOSE AEPB INHALE ONE DOSE BY MOUTH EVERY 12 HOURS  . [DISCONTINUED] furosemide (LASIX) 20 MG tablet Take 1 tablet (20 mg total) by mouth as needed.  . [DISCONTINUED] glyBURIDE-metformin (GLUCOVANCE) 2.5-500 MG per tablet 1 tablet am, 1/2 tablet pm    Allergies  Allergen Reactions  . Niacin Itching    Niaspan     Current Medications, Allergies, Past Medical History, Past Surgical History, Family History, and Social History were reviewed in [employer] record.   Review of Systems        See HPI - all other  systems neg except as noted...       The patient complains of dyspnea on exertion.  The patient denies anorexia, fever, weight loss, weight gain, vision loss, decreased hearing, hoarseness, chest pain, syncope, peripheral edema, prolonged cough, headaches, hemoptysis, abdominal pain, melena, hematochezia, severe indigestion/heartburn, hematuria, incontinence, muscle weakness, suspicious skin lesions, transient blindness, difficulty walking, depression, unusual weight change, abnormal bleeding, enlarged lymph nodes, and angioedema.     Objective:   Physical Exam    WD, WN, 78 y/o WM in NAD... GENERAL:  Alert & oriented; pleasant & cooperative... HEENT:  [place]/AT, EOM-wnl, PERRLA, EACs-clear, TMs-wnl, NOSE-clear, THROAT-clear & wnl. NECK:  Supple w/ fairROM; no JVD; normal carotid impulses w/o bruits; no thyromegaly or nodules palpated; no lymphadenopathy. CHEST:  Few scat rhonchi without wheezing, rales, or signs of consolid... HEART:  Regular Rhythm;  gr1/6 SEM, without rubs or gallops heard... ABDOMEN:  Soft & nontender; normal bowel sounds; no organomegaly or masses detected. EXT: without deformities, mild arthritic changes; no varicose veins/ venous insuffic/ or edema. NEURO:  CN's intact; motor testing normal; sensory testing normal; gait normal & balance OK. DERM:  No lesions noted; no rash etc...  RADIOLOGY DATA:  Reviewed in the EPIC EMR & discussed w/ the patient...  LABORATORY DATA:  Reviewed in the EPIC EMR & discussed w/ the patient...    Assessment:      RECTOSIGMOID NARROWING >> w/ CEA 5.3=>8.0 and Abn PET scan as above; I told him I thought this was prob a colon cancer; we outlined his options and he again has chosen watchful waiting & declines colonoscopy or surgery; he will f/u in 3-11mo& sooner if needed..Marland KitchenMarland Kitchen  COPD/ [name]>  PFTs show GOLD Stage4 COPD; we decided to add Spiriva to the Advair, Xopenex, Mucinex; asked to take meds regularly & incr exercise... 4/15> we will treat  w/ augmentin & Pred for his resp symptoms, incr SOB, etc...  HBP>  Controlled on meds, continue same, asked to monitor at home...  CAD>  S/p CABG, he is too sedentary & asked to incr exercise; no ischemia on Myoview 7/14...  CHF, Cardiomyopathy>  Followed by [name] & Lovena Le; continue meds...  Hx VTach>  Stable on Mexilitene & has AICD monitored by [name]/[name]... Lead changed & new generator placed 12/13...  ASPVD>  He is s/p right SFA PTA & doing satis, but needs to incr exercise & continue ASA...  CHOL>  On Prav80 + Feno160; last FLP by [name] 2/13 looked good x lowHDL needs incr exercise...  DM>  On diet + Glucovance; A1c is improved on 1.5 tabs per day...  Renal Insuffic>  Creat= 1.4-1.6 at baseline & 1.7 currently, we are decr the Lasix back to 75m/d which is what the pt wants...  DJD>  Aware, stable on OTC meds...  Anxiety>  Stable & not on anxiolytic Rx...     Plan:     Patient's Medications  New Prescriptions   AMOXICILLIN-CLAVULANATE (AUGMENTIN) 875-125 MG PER TABLET    Take 1 tablet by mouth 2 (two) times daily.   PREDNISONE (DELTASONE) 20 MG TABLET    Take 1 tablet by mouth two times daily x 3 days, 1 daily x 3 days, 1/2 tablet by mouth daily until gone   TIOTROPIUM (SPIRIVA) 18 MCG INHALATION CAPSULE    Place 1 capsule (18 mcg total) into inhaler and inhale daily.  Previous Medications   ADVAIR DISKUS 500-50 MCG/DOSE AEPB    INHALE ONE DOSE BY MOUTH TWICE DAILY   ASPIRIN EC 325 MG TABLET    1/2 tablet daily x 6 days  1 tablet daily x 1 day   CARVEDILOL (COREG) 6.25 MG TABLET    Take 1 tablet (6.25 mg total) by mouth 2 (two) times daily with a meal.   ENALAPRIL (VASOTEC) 2.5 MG TABLET    Take 1 tablet (2.5 mg total) by mouth 2 (two) times daily.   FENOFIBRATE 160 MG TABLET    Take 1 tablet (160 mg total) by mouth daily.   FUROSEMIDE (LASIX) 40 MG TABLET    Take 40 mg by mouth daily.   GLYBURIDE-METFORMIN (GLUCOVANCE) 2.5-500 MG PER TABLET    TAKE ONE TABLET  BY MOUTH IN THE MORNING AND  1/2 TABLET  AT DINNER   GUAIFENESIN (MUCINEX MAXIMUM STRENGTH) 1200 MG TB12    Take 1 tablet by mouth 2 (two) times daily.   MEXILETINE (MEXITIL) 150 MG CAPSULE    TAKE ONE CAPSULE BY MOUTH TWICE DAILY   PRAVASTATIN (PRAVACHOL) 80 MG TABLET    TAKE ONE TABLET BY MOUTH ONCE DAILY  Modified Medications   No medications on file  Discontinued Medications   ADVAIR DISKUS 500-50 MCG/DOSE AEPB    INHALE ONE DOSE BY MOUTH EVERY 12 HOURS   FUROSEMIDE (LASIX) 20 MG TABLET    Take 1 tablet (20 mg total) by mouth as needed.   GLYBURIDE-METFORMIN (GLUCOVANCE) 2.5-500 MG PER TABLET    1 tablet am, 1/2 tablet pm

## 2013-08-31 NOTE — Patient Instructions (Signed)
Today we updated your med list in our EPIC system...    Continue your current medications the same...  Today we did a follow up CXR, pulmonary function test, and LABS...    We will contact you w/ the results when available...   We decided to ADD [facility]- inhale the contents of one capsule via the handihaler daily...  We will treat you w/ a round of antibiotics and prednisone to eliminate any infection or bronchial inflammation that may be present...  In the meanwhile you need to gradually increase your exercise program to build up your stamina...  Call for any questions...  Let's continue our 4 month follow up visits, but call anytime for additional problems.Marland KitchenMarland Kitchen

## 2013-09-01 LAB — CEA: CEA: 8 ng/mL — ABNORMAL HIGH (ref 0.0–5.0)

## 2013-09-08 ENCOUNTER — Other Ambulatory Visit: Payer: Self-pay | Admitting: Cardiology

## 2013-09-22 ENCOUNTER — Encounter: Payer: Medicare Other | Admitting: *Deleted

## 2013-10-05 ENCOUNTER — Encounter: Payer: Self-pay | Admitting: *Deleted

## 2013-10-18 ENCOUNTER — Other Ambulatory Visit: Payer: Self-pay | Admitting: Cardiology

## 2013-10-18 ENCOUNTER — Other Ambulatory Visit: Payer: Self-pay | Admitting: Internal Medicine

## 2013-10-24 ENCOUNTER — Encounter: Payer: Self-pay | Admitting: Cardiology

## 2013-10-26 ENCOUNTER — Ambulatory Visit (INDEPENDENT_AMBULATORY_CARE_PROVIDER_SITE_OTHER): Payer: Medicare Other | Admitting: *Deleted

## 2013-10-26 DIAGNOSIS — Z95810 Presence of automatic (implantable) cardiac defibrillator: Secondary | ICD-10-CM

## 2013-10-26 DIAGNOSIS — I5022 Chronic systolic (congestive) heart failure: Secondary | ICD-10-CM

## 2013-10-26 DIAGNOSIS — I4729 Other ventricular tachycardia: Secondary | ICD-10-CM

## 2013-10-26 DIAGNOSIS — I472 Ventricular tachycardia: Secondary | ICD-10-CM

## 2013-10-26 LAB — MDC_IDC_ENUM_SESS_TYPE_REMOTE
Battery Remaining Longevity: 130 mo
Battery Voltage: 3.02 V
Brady Statistic RV Percent Paced: 0.06 %
HIGH POWER IMPEDANCE MEASURED VALUE: 171 Ohm
HighPow Impedance: 83 Ohm
Lead Channel Sensing Intrinsic Amplitude: 18.25 mV
Lead Channel Sensing Intrinsic Amplitude: 18.25 mV
Lead Channel Setting Pacing Pulse Width: 0.4 ms
Lead Channel Setting Sensing Sensitivity: 0.3 mV
MDC IDC MSMT LEADCHNL RV IMPEDANCE VALUE: 399 Ohm
MDC IDC MSMT LEADCHNL RV PACING THRESHOLD AMPLITUDE: 0.75 V
MDC IDC MSMT LEADCHNL RV PACING THRESHOLD PULSEWIDTH: 0.4 ms
MDC IDC SESS DTM: 20150603170855
MDC IDC SET LEADCHNL RV PACING AMPLITUDE: 2 V
MDC IDC SET ZONE DETECTION INTERVAL: 430 ms
Zone Setting Detection Interval: 300 ms
Zone Setting Detection Interval: 450 ms

## 2013-10-26 NOTE — Progress Notes (Signed)
Remote ICD transmission.   

## 2013-10-27 ENCOUNTER — Other Ambulatory Visit: Payer: Self-pay | Admitting: Cardiology

## 2013-10-27 ENCOUNTER — Other Ambulatory Visit: Payer: Self-pay | Admitting: Pulmonary Disease

## 2013-11-04 ENCOUNTER — Encounter: Payer: Self-pay | Admitting: Cardiology

## 2013-11-07 ENCOUNTER — Ambulatory Visit: Payer: Medicare Other | Admitting: Pulmonary Disease

## 2013-11-08 ENCOUNTER — Encounter: Payer: Self-pay | Admitting: Internal Medicine

## 2013-11-17 ENCOUNTER — Other Ambulatory Visit: Payer: Self-pay | Admitting: Cardiology

## 2013-11-29 ENCOUNTER — Other Ambulatory Visit: Payer: Self-pay | Admitting: Cardiology

## 2013-12-05 ENCOUNTER — Telehealth: Payer: Self-pay | Admitting: Internal Medicine

## 2013-12-05 NOTE — Telephone Encounter (Signed)
Spoke with patient and he says his weight is stable and the swelling in his feet go down over night.  He says he has been taking Furosemide daily for a while even though last office note says as needed.  I am not sure how compliant he is with his diet.  I have asked him to increase his Furosemide to 60mg  daily for 3 days and then call me Thurs to see if this helps.  His potassium has been on the high side at 5.1 and has not taken Potassium for this reason.

## 2013-12-05 NOTE — Telephone Encounter (Signed)
New Message  Pt wife called states that the pt's legs are very swollen. They are out of town and requests a call back to discuss how to get the swelling down. Please call.

## 2013-12-08 NOTE — Telephone Encounter (Signed)
F/u ° ° °Pt returning your call °

## 2013-12-08 NOTE — Telephone Encounter (Signed)
Swelling has gone down and he is better.  He will be back in town on Sat, will add on Mon at 4:30pm

## 2013-12-12 ENCOUNTER — Ambulatory Visit (INDEPENDENT_AMBULATORY_CARE_PROVIDER_SITE_OTHER): Payer: Medicare Other | Admitting: Internal Medicine

## 2013-12-12 ENCOUNTER — Encounter: Payer: Self-pay | Admitting: Internal Medicine

## 2013-12-12 VITALS — BP 80/60 | HR 110 | Ht 67.0 in | Wt 160.0 lb

## 2013-12-12 DIAGNOSIS — I4729 Other ventricular tachycardia: Secondary | ICD-10-CM

## 2013-12-12 DIAGNOSIS — I472 Ventricular tachycardia, unspecified: Secondary | ICD-10-CM

## 2013-12-12 DIAGNOSIS — I4892 Unspecified atrial flutter: Secondary | ICD-10-CM

## 2013-12-12 DIAGNOSIS — I2589 Other forms of chronic ischemic heart disease: Secondary | ICD-10-CM

## 2013-12-12 DIAGNOSIS — I5022 Chronic systolic (congestive) heart failure: Secondary | ICD-10-CM

## 2013-12-12 DIAGNOSIS — I483 Typical atrial flutter: Secondary | ICD-10-CM

## 2013-12-12 DIAGNOSIS — Z95810 Presence of automatic (implantable) cardiac defibrillator: Secondary | ICD-10-CM

## 2013-12-12 DIAGNOSIS — I2510 Atherosclerotic heart disease of native coronary artery without angina pectoris: Secondary | ICD-10-CM

## 2013-12-12 DIAGNOSIS — I255 Ischemic cardiomyopathy: Secondary | ICD-10-CM

## 2013-12-12 LAB — MDC_IDC_ENUM_SESS_TYPE_INCLINIC
Battery Remaining Longevity: 129 mo
Battery Voltage: 3.01 V
Date Time Interrogation Session: 20150720170128
HighPow Impedance: 209 Ohm
HighPow Impedance: 85 Ohm
Lead Channel Impedance Value: 399 Ohm
Lead Channel Pacing Threshold Pulse Width: 0.4 ms
Lead Channel Setting Sensing Sensitivity: 0.3 mV
MDC IDC MSMT LEADCHNL RV PACING THRESHOLD AMPLITUDE: 0.75 V
MDC IDC MSMT LEADCHNL RV SENSING INTR AMPL: 17.875 mV
MDC IDC MSMT LEADCHNL RV SENSING INTR AMPL: 20.375 mV
MDC IDC SET LEADCHNL RV PACING AMPLITUDE: 2 V
MDC IDC SET LEADCHNL RV PACING PULSEWIDTH: 0.4 ms
MDC IDC SET ZONE DETECTION INTERVAL: 300 ms
MDC IDC STAT BRADY RV PERCENT PACED: 0.04 %
Zone Setting Detection Interval: 430 ms
Zone Setting Detection Interval: 450 ms

## 2013-12-12 MED ORDER — APIXABAN 5 MG PO TABS: 5.0000 mg | ORAL_TABLET | Freq: Two times a day (BID) | ORAL | Status: DC

## 2013-12-12 MED ORDER — AMIODARONE HCL 200 MG PO TABS: 200.0000 mg | ORAL_TABLET | Freq: Two times a day (BID) | ORAL | Status: DC

## 2013-12-12 NOTE — Assessment & Plan Note (Signed)
His Medtronic single chamber device is working normally. Will recheck in several months.

## 2013-12-12 NOTE — Progress Notes (Signed)
HPI [name] returns today for followup. He is a pleasant 78 yo man with an [name], [name], [name] and [name]. He initially presented with [name] over 18 months ago and was placed on [name] and received an [name]. Over the past few months and particularly over the past two weeks he has had increased [name], but no palpitations or [name] shock. He notes that two weeks ago he developed [name], [name] and [name]. No [name]. Allergies  Allergen Reactions  . [name] Itching    [name]      Current Outpatient Prescriptions  Medication Sig Dispense Refill  . [name] 500-50 MCG/DOSE AEPB INHALE ONE DOSE BY MOUTH TWICE DAILY  60 each  5  . [name] EC 325 MG tablet 1/2 tablet daily x 6 days  1 tablet daily x 1 day      . [name] (COREG) 6.25 MG tablet TAKE ONE TABLET BY MOUTH TWICE DAILY WITH MEALS  60 tablet  3  . [name] (VASOTEC) 2.5 MG tablet TAKE ONE TABLET BY MOUTH TWICE DAILY  60 tablet  3  . [name] 160 MG tablet Take 1 tablet (160 mg total) by mouth daily.  90 tablet  3  . [name] (LASIX) 40 MG tablet TAKE ONE TABLET BY MOUTH DAILY  90 tablet  0  . [name] (GLUCOVANCE) 2.5-500 MG per tablet TAKE ONE TABLET BY MOUTH IN THE MORNING AND 1/2 TABLET AT DINNER  135 tablet  0  . Guaifenesin (MUCINEX MAXIMUM STRENGTH) 1200 MG TB12 Take 1 tablet by mouth 2 (two) times daily.      . pravastatin (PRAVACHOL) 80 MG tablet TAKE ONE TABLET BY MOUTH ONCE DAILY  30 tablet  3  . tiotropium (SPIRIVA) 18 MCG inhalation capsule Place 1 capsule (18 mcg total) into inhaler and inhale daily.  30 capsule  12   No current facility-administered medications for this visit.     Past Medical History  Diagnosis Date  . VENTRICULAR TACHYCARDIA   . [name] VASCULAR DISEASE   . HYPERTENSION   . HYPERCHOLESTEROLEMIA   . CORONARY ARTERY DISEASE   . [name] systolic heart failure   . CAROTID ARTERY DISEASE   . SEBORRHEIC DERMATITIS   . RENAL INSUFFICIENCY   .  DIVERTICULOSIS OF COLON   . CPK, ABNORMAL   . COPD   . COLONIC POLYPS   . CANDIDIASIS, ORAL   . ASTHMATIC BRONCHITIS, ACUTE   . ANXIETY   . [name] (implantable cardiac defibrillator) in place   . Myocardial infarction ?1990's    "silent" (05/24/2012)  . [name] bronchitis     "every year" (05/24/2012)  . Type II diabetes mellitus   . [name] lower back pain   . DJD (degenerative joint disease)     "hands" (05/24/2012)  . Osteoarthritis of finger     ROS:   All systems reviewed and negative except as noted in the HPI.   Past Surgical History  Procedure Laterality Date  . Coronary artery bypass graft  1990's    CABG X4  . Tonsillectomy  ?1942  . Cardiac defibrillator placement  12/2004    single chamber defibrillator- Medtronic Maxima 8/06 DrKlein [Other][     Family History  Problem Relation Age of Onset  . Heart attack Father   . Heart attack Brother      History   Social History  . Marital Status: Married    Spouse Name: N/A    Number of Children: N/A  .  Years of Education: N/A   Occupational History  . Not on file.   Social History Main Topics  . Smoking status: Former Smoker -- 2.00 packs/day for 50 years    Types: Cigarettes    Quit date: 06/24/2000  . Smokeless tobacco: Never Used  . Alcohol Use: Yes     Comment: 05/24/2012 "used to drink alot; quit > 10 yr ago"  . Drug Use: No  . Sexual Activity: No   Other Topics Concern  . Not on file   Social History Narrative  . No narrative on file     BP 80/60  Pulse 110  Ht 5\' 7"  (1.702 m)  Wt 160 lb (72.576 kg)  BMI 25.05 kg/m2  Physical Exam:  stable appearing 78 yo man, NAD HEENT: Unremarkable Neck:  7 cm JVD, no thyromegally Back:  No CVA tenderness Lungs:  Clear with scattered basilar rales HEART:   rate rhythm, no murmurs, no rubs, no clicks Abd:  soft, positive bowel sounds, no organomegally, no rebound, no guarding Ext:  2 plus pulses, no [name], no cyanosis, no clubbing Skin:  No  rashes no nodules Neuro:  CN II through XII intact, motor grossly intact  EKG - atrial flutter with a CVR  DEVICE  Normal device function.  See PaceArt for details.   Assess/Plan:

## 2013-12-12 NOTE — Assessment & Plan Note (Signed)
This is a new problem. He is also very likely to have atrial fib with his underlying cardiomyopathy and age. I have recommended initiation of amiodarone, and eliquis, and schedule the patient for TEE guided DCCV next week after a brief initiation of amiodarone to help him maintain NSR.

## 2013-12-12 NOTE — Patient Instructions (Addendum)
Your physician has recommended you make the following change in your medication:  1) Change how you take your Lasix:  Take 2 pills in the morning, take 1 pill in the afternoon - for 3 days, then take 40 mg twice daily 2) START  Amiodarone 200 mg twice daily 3) START Eliquis 5 mg twice daily 4) STOP Mexiletine on 12/21/13 5) STOP Aspirin  Your physician has requested that you have a TEE/Cardioversion. During a TEE, sound waves are used to create images of your heart. It provides your doctor with information about the size and shape of your heart and how well your heart's chambers and valves are working. In this test, a transducer is attached to the end of a flexible tube that is guided down you throat and into your esophagus (the tube leading from your mouth to your stomach) to get a more detailed image of your heart. Once the TEE has determined that a blood clot is not present, the cardioversion begins. Electrical Cardioversion uses a jolt of electricity to your heart either through paddles or wired patches attached to your chest. This is a controlled, usually prescheduled, procedure. This procedure is done at the hospital and you are not awake during the procedure. You usually go home the day of the procedure. Please see the instruction sheet given to you today for more information.  We will contact you to schedule this - [phone]  Your physician recommends that you schedule a follow-up appointment in: one month with Dr. Percival Spanish.  Remote monitoring is used to monitor your Pacemaker of ICD from home. This monitoring reduces the number of office visits required to check your device to one time per year. It allows [place] to keep an eye on the functioning of your device to ensure it is working properly. You are scheduled for a device check from home on 03/15/14. You may send your transmission at any time that day. If you have a wireless device, the transmission will be sent automatically. After your physician  reviews your transmission, you will receive a postcard with your next transmission date.  Your physician wants you to follow-up in: 1 year with Dr. Lovena Le.  You will receive a reminder letter in the mail two months in advance. If you don't receive a letter, please call our office to schedule the follow-up appointment.

## 2013-12-12 NOTE — Assessment & Plan Note (Signed)
He has had no recurrent sustained VT. With initiation of amiodarone, I will have him stop his Mexitil in a week. He will use amio to control both his atrial and ventricular arrhythmias.

## 2013-12-12 NOTE — Assessment & Plan Note (Signed)
His symptoms have worsened. He has peripheral edema but does not appear to be decompensated on exam. I have given him warnings about when he needs to go to the hospital if his symptoms return. He will increase his lasix as directed, reduce his sodium intake and hopefully improve from a CHF perspective once he is back in NSR.

## 2013-12-13 ENCOUNTER — Other Ambulatory Visit: Payer: Self-pay | Admitting: *Deleted

## 2013-12-13 ENCOUNTER — Telehealth: Payer: Self-pay | Admitting: Internal Medicine

## 2013-12-13 ENCOUNTER — Encounter (HOSPITAL_COMMUNITY): Payer: Self-pay | Admitting: Pharmacy Technician

## 2013-12-13 ENCOUNTER — Encounter: Payer: Self-pay | Admitting: *Deleted

## 2013-12-13 DIAGNOSIS — I480 Paroxysmal atrial fibrillation: Secondary | ICD-10-CM

## 2013-12-13 DIAGNOSIS — Z01812 Encounter for preprocedural laboratory examination: Secondary | ICD-10-CM

## 2013-12-13 DIAGNOSIS — I4892 Unspecified atrial flutter: Secondary | ICD-10-CM

## 2013-12-13 NOTE — Telephone Encounter (Signed)
Patient was seen yesterday but forgot to request that his potassium levels be checked. Routed to Dr. Lovena Le.

## 2013-12-13 NOTE — Telephone Encounter (Signed)
New message           Pt would like to have his potassium levels checked in the lab

## 2013-12-13 NOTE — Telephone Encounter (Signed)
Called patient to schedule TEE/DCCV. Pt has me discuss/arrange with his wife.  Scheduled for 7/30 w/ Nahser  Pre procedure labs 7/22 Informed patient's wife that we would be looking at pt's potassium level with pre procedure labs. Reviewed instructions with wife and left letter of instructions at front desk for pick up tomorrow. Patient's wife verbalized understanding and agreeable to plan.

## 2013-12-14 ENCOUNTER — Other Ambulatory Visit (INDEPENDENT_AMBULATORY_CARE_PROVIDER_SITE_OTHER): Payer: Medicare Other

## 2013-12-14 DIAGNOSIS — I4891 Unspecified atrial fibrillation: Secondary | ICD-10-CM

## 2013-12-14 DIAGNOSIS — I480 Paroxysmal atrial fibrillation: Secondary | ICD-10-CM

## 2013-12-14 DIAGNOSIS — Z01812 Encounter for preprocedural laboratory examination: Secondary | ICD-10-CM

## 2013-12-14 DIAGNOSIS — I4892 Unspecified atrial flutter: Secondary | ICD-10-CM

## 2013-12-14 LAB — CBC WITH DIFFERENTIAL/PLATELET
Basophils Absolute: 0 10*3/uL (ref 0.0–0.1)
Basophils Relative: 0.4 % (ref 0.0–3.0)
EOS PCT: 1 % (ref 0.0–5.0)
Eosinophils Absolute: 0.1 10*3/uL (ref 0.0–0.7)
HCT: 40.8 % (ref 39.0–52.0)
Hemoglobin: 13.4 g/dL (ref 13.0–17.0)
Lymphocytes Relative: 18.6 % (ref 12.0–46.0)
Lymphs Abs: 1.4 10*3/uL (ref 0.7–4.0)
MCHC: 32.8 g/dL (ref 30.0–36.0)
MCV: 89.8 fl (ref 78.0–100.0)
MONOS PCT: 12.8 % — AB (ref 3.0–12.0)
Monocytes Absolute: 0.9 10*3/uL (ref 0.1–1.0)
NEUTROS PCT: 67.2 % (ref 43.0–77.0)
Neutro Abs: 5 10*3/uL (ref 1.4–7.7)
PLATELETS: 196 10*3/uL (ref 150.0–400.0)
RBC: 4.55 Mil/uL (ref 4.22–5.81)
RDW: 16.4 % — ABNORMAL HIGH (ref 11.5–15.5)
WBC: 7.4 10*3/uL (ref 4.0–10.5)

## 2013-12-14 LAB — BASIC METABOLIC PANEL
BUN: 35 mg/dL — AB (ref 6–23)
CO2: 36 mEq/L — ABNORMAL HIGH (ref 19–32)
CREATININE: 2.1 mg/dL — AB (ref 0.4–1.5)
Calcium: 9.3 mg/dL (ref 8.4–10.5)
Chloride: 88 mEq/L — ABNORMAL LOW (ref 96–112)
GFR: 32.77 mL/min — ABNORMAL LOW (ref >60.00)
Glucose, Bld: 148 mg/dL — ABNORMAL HIGH (ref 70–99)
Potassium: 4.3 mEq/L (ref 3.5–5.1)
Sodium: 131 mEq/L — ABNORMAL LOW (ref 135–145)

## 2013-12-15 ENCOUNTER — Encounter: Payer: Self-pay | Admitting: Internal Medicine

## 2013-12-15 ENCOUNTER — Telehealth: Payer: Self-pay

## 2013-12-15 NOTE — Telephone Encounter (Signed)
Error

## 2013-12-16 ENCOUNTER — Other Ambulatory Visit: Payer: Self-pay

## 2013-12-16 DIAGNOSIS — I483 Typical atrial flutter: Secondary | ICD-10-CM

## 2013-12-16 MED ORDER — APIXABAN 5 MG PO TABS: 5.0000 mg | ORAL_TABLET | Freq: Two times a day (BID) | ORAL | Status: DC

## 2013-12-22 ENCOUNTER — Encounter (HOSPITAL_COMMUNITY): Payer: Medicare Other | Admitting: Certified Registered Nurse Anesthetist

## 2013-12-22 ENCOUNTER — Ambulatory Visit (HOSPITAL_COMMUNITY): Payer: Medicare Other | Admitting: Certified Registered Nurse Anesthetist

## 2013-12-22 ENCOUNTER — Encounter (HOSPITAL_COMMUNITY)
Admission: RE | Disposition: A | Discharge status: Home / Self Care | Payer: Self-pay | Source: Ambulatory Visit | Attending: Cardiovascular Disease

## 2013-12-22 ENCOUNTER — Ambulatory Visit (HOSPITAL_COMMUNITY)
Admission: RE | Admit: 2013-12-22 | Discharge: 2013-12-22 | Disposition: A | Discharge status: Home / Self Care | Payer: Medicare Other | Source: Ambulatory Visit | Attending: Cardiovascular Disease | Admitting: Cardiovascular Disease

## 2013-12-22 ENCOUNTER — Encounter (HOSPITAL_COMMUNITY): Payer: Self-pay | Admitting: Gastroenterology

## 2013-12-22 DIAGNOSIS — T82190A Other mechanical complication of cardiac electrode, initial encounter: Secondary | ICD-10-CM | POA: Diagnosis not present

## 2013-12-22 DIAGNOSIS — I2510 Atherosclerotic heart disease of native coronary artery without angina pectoris: Secondary | ICD-10-CM | POA: Insufficient documentation

## 2013-12-22 DIAGNOSIS — I10 Essential (primary) hypertension: Secondary | ICD-10-CM | POA: Diagnosis not present

## 2013-12-22 DIAGNOSIS — I509 Heart failure, unspecified: Secondary | ICD-10-CM | POA: Diagnosis not present

## 2013-12-22 DIAGNOSIS — E7800 Pure hypercholesterolemia, unspecified: Secondary | ICD-10-CM | POA: Insufficient documentation

## 2013-12-22 DIAGNOSIS — Z95810 Presence of automatic (implantable) cardiac defibrillator: Secondary | ICD-10-CM | POA: Insufficient documentation

## 2013-12-22 DIAGNOSIS — I5022 Chronic systolic (congestive) heart failure: Secondary | ICD-10-CM | POA: Insufficient documentation

## 2013-12-22 DIAGNOSIS — Z951 Presence of aortocoronary bypass graft: Secondary | ICD-10-CM | POA: Diagnosis not present

## 2013-12-22 DIAGNOSIS — I369 Nonrheumatic tricuspid valve disorder, unspecified: Secondary | ICD-10-CM

## 2013-12-22 DIAGNOSIS — I252 Old myocardial infarction: Secondary | ICD-10-CM | POA: Diagnosis not present

## 2013-12-22 DIAGNOSIS — Y849 Medical procedure, unspecified as the cause of abnormal reaction of the patient, or of later complication, without mention of misadventure at the time of the procedure: Secondary | ICD-10-CM | POA: Diagnosis not present

## 2013-12-22 DIAGNOSIS — Z87891 Personal history of nicotine dependence: Secondary | ICD-10-CM | POA: Insufficient documentation

## 2013-12-22 DIAGNOSIS — E119 Type 2 diabetes mellitus without complications: Secondary | ICD-10-CM | POA: Insufficient documentation

## 2013-12-22 DIAGNOSIS — Z538 Procedure and treatment not carried out for other reasons: Secondary | ICD-10-CM | POA: Insufficient documentation

## 2013-12-22 DIAGNOSIS — Z7982 Long term (current) use of aspirin: Secondary | ICD-10-CM | POA: Insufficient documentation

## 2013-12-22 DIAGNOSIS — I4891 Unspecified atrial fibrillation: Secondary | ICD-10-CM | POA: Insufficient documentation

## 2013-12-22 HISTORY — PX: CARDIOVERSION: SHX1299

## 2013-12-22 HISTORY — PX: TEE WITHOUT CARDIOVERSION: SHX5443

## 2013-12-22 LAB — GLUCOSE, CAPILLARY
GLUCOSE-CAPILLARY: 93 mg/dL (ref 70–99)
GLUCOSE-CAPILLARY: 51 mg/dL — AB (ref 70–99)
GLUCOSE-CAPILLARY: 138 mg/dL — AB (ref 70–99)
Glucose-Capillary: 123 mg/dL — ABNORMAL HIGH (ref 70–99)

## 2013-12-22 SURGERY — ECHOCARDIOGRAM, TRANSESOPHAGEAL
Anesthesia: Monitor Anesthesia Care

## 2013-12-22 MED ORDER — ALBUTEROL SULFATE HFA 108 (90 BASE) MCG/ACT IN AERS
INHALATION_SPRAY | RESPIRATORY_TRACT | Status: DC | PRN
  Administered 2013-12-22: 2 via RESPIRATORY_TRACT

## 2013-12-22 MED ORDER — LACTATED RINGERS IV SOLN
INTRAVENOUS | Status: DC
  Administered 2013-12-22: 1000 mL via INTRAVENOUS

## 2013-12-22 MED ORDER — PHENYLEPHRINE HCL 10 MG/ML IJ SOLN
INTRAMUSCULAR | Status: DC | PRN
  Administered 2013-12-22 (×5): 80 ug via INTRAVENOUS

## 2013-12-22 MED ORDER — PROPOFOL INFUSION 10 MG/ML OPTIME
INTRAVENOUS | Status: DC | PRN
  Administered 2013-12-22: 75 ug/kg/min via INTRAVENOUS

## 2013-12-22 MED ORDER — DEXTROSE 5 % IV SOLN
INTRAVENOUS | Status: DC
  Administered 2013-12-22 (×2): via INTRAVENOUS

## 2013-12-22 MED ORDER — DEXTROSE 50 % IV SOLN
INTRAVENOUS | Status: AC
  Filled 2013-12-22: qty 50

## 2013-12-22 MED ORDER — SODIUM CHLORIDE 0.9 % IV SOLN
INTRAVENOUS | Status: DC
Start: 2013-12-22 — End: 2013-12-22

## 2013-12-22 MED ORDER — BUTAMBEN-TETRACAINE-BENZOCAINE 2-2-14 % EX AERO
INHALATION_SPRAY | CUTANEOUS | Status: DC | PRN
  Administered 2013-12-22: 2 via TOPICAL

## 2013-12-22 MED ORDER — LACTATED RINGERS IV SOLN
INTRAVENOUS | Status: DC | PRN
  Administered 2013-12-22: 11:00:00 via INTRAVENOUS

## 2013-12-22 MED ORDER — DEXTROSE 50 % IV SOLN
25.0000 mL | Freq: Once | INTRAVENOUS | Status: AC
  Administered 2013-12-22: 25 mL via INTRAVENOUS

## 2013-12-22 MED ORDER — APIXABAN 5 MG PO TABS
5.0000 mg | ORAL_TABLET | Freq: Once | ORAL | Status: AC
  Administered 2013-12-22: 5 mg via ORAL
  Filled 2013-12-22: qty 1

## 2013-12-22 NOTE — Anesthesia Postprocedure Evaluation (Signed)
  Anesthesia Post-op Note  Patient: [name]  Procedure(s) Performed: Procedure(s): TRANSESOPHAGEAL ECHOCARDIOGRAM (TEE) (N/A) CARDIOVERSION (N/A)  Patient Location: Endoscopy Unit  Anesthesia Type:MAC  Level of Consciousness: awake, alert  and oriented  Airway and Oxygen Therapy: Patient Spontanous Breathing and Patient connected to nasal cannula oxygen  Post-op Pain: none  Post-op Assessment: Post-op Vital signs reviewed, Patient's Cardiovascular Status Stable, Respiratory Function Stable, Patent Airway and No signs of Nausea or vomiting  Post-op Vital Signs: Reviewed and stable  Last Vitals:  Filed Vitals:   12/22/13 1107  BP: 139/81  Pulse:   Temp:   Resp: 17    Complications: No apparent anesthesia complications

## 2013-12-22 NOTE — Progress Notes (Signed)
*  PRELIMINARY RESULTS* Echocardiogram Echocardiogram Transesophageal has been performed.  Leavy Cella 12/22/2013, 11:57 AM

## 2013-12-22 NOTE — Transfer of Care (Signed)
Immediate Anesthesia Transfer of Care Note  Patient: [name]  Procedure(s) Performed: Procedure(s): TRANSESOPHAGEAL ECHOCARDIOGRAM (TEE) (N/A) CARDIOVERSION (N/A)  Patient Location: Endoscopy Unit  Anesthesia Type:MAC  Level of Consciousness: awake, alert  and oriented  Airway & Oxygen Therapy: Patient Spontanous Breathing  Post-op Assessment: Report given to PACU RN  Post vital signs: Reviewed and stable  Complications: No apparent anesthesia complications

## 2013-12-22 NOTE — H&P (View-Only) (Signed)
HPI [name] returns today for followup. He is a pleasant 78 yo man with an ICM, chronic systolic CHF, HTN and DM. He initially presented with VT over 18 months ago and was placed on Mexilitine and received an ICD. Over the past few months and particularly over the past two weeks he has had increased peripheral edema, but no palpitations or ICD shock. He notes that two weeks ago he developed weakness, fatigue and edema. No syncope. Allergies  Allergen Reactions  . Niacin Itching    Niaspan      Current Outpatient Prescriptions  Medication Sig Dispense Refill  . ADVAIR DISKUS 500-50 MCG/DOSE AEPB INHALE ONE DOSE BY MOUTH TWICE DAILY  60 each  5  . aspirin EC 325 MG tablet 1/2 tablet daily x 6 days  1 tablet daily x 1 day      . carvedilol (COREG) 6.25 MG tablet TAKE ONE TABLET BY MOUTH TWICE DAILY WITH MEALS  60 tablet  3  . enalapril (VASOTEC) 2.5 MG tablet TAKE ONE TABLET BY MOUTH TWICE DAILY  60 tablet  3  . fenofibrate 160 MG tablet Take 1 tablet (160 mg total) by mouth daily.  90 tablet  3  . furosemide (LASIX) 40 MG tablet TAKE ONE TABLET BY MOUTH DAILY  90 tablet  0  . glyBURIDE-metformin (GLUCOVANCE) 2.5-500 MG per tablet TAKE ONE TABLET BY MOUTH IN THE MORNING AND 1/2 TABLET AT DINNER  135 tablet  0  . Guaifenesin (MUCINEX MAXIMUM STRENGTH) 1200 MG TB12 Take 1 tablet by mouth 2 (two) times daily.      . pravastatin (PRAVACHOL) 80 MG tablet TAKE ONE TABLET BY MOUTH ONCE DAILY  30 tablet  3  . tiotropium (SPIRIVA) 18 MCG inhalation capsule Place 1 capsule (18 mcg total) into inhaler and inhale daily.  30 capsule  12   No current facility-administered medications for this visit.     Past Medical History  Diagnosis Date  . VENTRICULAR TACHYCARDIA   . PERIPHERAL VASCULAR DISEASE   . HYPERTENSION   . HYPERCHOLESTEROLEMIA   . CORONARY ARTERY DISEASE   . Chronic systolic heart failure   . CAROTID ARTERY DISEASE   . SEBORRHEIC DERMATITIS   . RENAL INSUFFICIENCY   .  DIVERTICULOSIS OF COLON   . CPK, ABNORMAL   . COPD   . COLONIC POLYPS   . CANDIDIASIS, ORAL   . ASTHMATIC BRONCHITIS, ACUTE   . ANXIETY   . ICD (implantable cardiac defibrillator) in place   . Myocardial infarction ?1990's    "silent" (05/24/2012)  . Chronic bronchitis     "every year" (05/24/2012)  . Type II diabetes mellitus   . Chronic lower back pain   . DJD (degenerative joint disease)     "hands" (05/24/2012)  . Osteoarthritis of finger     ROS:   All systems reviewed and negative except as noted in the HPI.   Past Surgical History  Procedure Laterality Date  . Coronary artery bypass graft  1990's    CABG X4  . Tonsillectomy  ?1942  . Cardiac defibrillator placement  12/2004    single chamber defibrillator- Medtronic Maxima 8/06 DrKlein [Other][     Family History  Problem Relation Age of Onset  . Heart attack Father   . Heart attack Brother      History   Social History  . Marital Status: Married    Spouse Name: N/A    Number of Children: N/A  .  Years of Education: N/A   Occupational History  . Not on file.   Social History Main Topics  . Smoking status: Former Smoker -- 2.00 packs/day for 50 years    Types: Cigarettes    Quit date: 06/24/2000  . Smokeless tobacco: Never Used  . Alcohol Use: Yes     Comment: 05/24/2012 "used to drink alot; quit > 10 yr ago"  . Drug Use: No  . Sexual Activity: No   Other Topics Concern  . Not on file   Social History Narrative  . No narrative on file     BP 80/60  Pulse 110  Ht 5\' 7"  (1.702 m)  Wt 160 lb (72.576 kg)  BMI 25.05 kg/m2  Physical Exam:  stable appearing 78 yo man, NAD HEENT: Unremarkable Neck:  7 cm JVD, no thyromegally Back:  No CVA tenderness Lungs:  Clear with scattered basilar rales HEART:   rate rhythm, no murmurs, no rubs, no clicks Abd:  soft, positive bowel sounds, no organomegally, no rebound, no guarding Ext:  2 plus pulses, no edema, no cyanosis, no clubbing Skin:  No  rashes no nodules Neuro:  CN II through XII intact, motor grossly intact  EKG - atrial flutter with a CVR  DEVICE  Normal device function.  See PaceArt for details.   Assess/Plan:

## 2013-12-22 NOTE — CV Procedure (Addendum)
    Transesophageal Echocardiogram Note  [name] [id number] 04-01-1936  Procedure: Transesophageal Echocardiogram Indications: atrial fib   Procedure Details Consent: Obtained Time Out: Verified patient identification, verified procedure, site/side was marked, verified correct patient position, special equipment/implants available, Radiology Safety Procedures followed,  medications/allergies/relevent history reviewed, required imaging and test results available.  Performed  Medications: Fentanyl:  Versed:  Propofol:  150 mg  Left Ventrical:  Severe LV dysfunction. EF 25-30%  Mitral Valve: mild - moderate MR  Aortic Valve: normal  Tricuspid Valve: mild TR  Pulmonic Valve: normal  Left Atrium/ Left atrial appendage: no thrombus  Atrial septum: intact  Aorta: mild calcification  ICD wire:  There is a mobile mass on the ICD lead as it passes through the right atrium.  The mass is mobile, measures ~1.0 cm in length and is about 2 mm win width.     Complications: No apparent complications Patient did tolerate procedure well.  Imp.   The mob ile mass on the pacer lead appears to be a vegetation or perhaps a thrombus.  He has only had several days of Eliquis .  I would prefer that he have a full month of anticoagulation and re-look TEE to see if this mass is smaller.  I have spoken to his wife - he has no hx of fevers or chills.  He does have a colon mass .  Will get blood cultures x 2. Will continue Eliquis. I will let the EP team know       [name]., MD, [facility] 12/22/2013, 11:36 AM

## 2013-12-22 NOTE — Discharge Instructions (Signed)
Monitored Anesthesia Care Monitored anesthesia care is an anesthesia service for a medical procedure. Anesthesia is the loss of the ability to feel pain. It is produced by medicines called anesthetics. It may affect a small area of your body (local anesthesia), a large area of your body (regional anesthesia), or your entire body (general anesthesia). The need for monitored anesthesia care depends your procedure, your condition, and the potential need for regional or general anesthesia. It is often provided during procedures where:   General anesthesia may be needed if there are complications. This is because you need special care when you are under general anesthesia.   You will be under local or regional anesthesia. This is so that you are able to have higher levels of anesthesia if needed.   You will receive calming medicines (sedatives). This is especially the case if sedatives are given to put you in a semi-conscious state of relaxation (deep sedation). This is because the amount of sedative needed to produce this state can be hard to predict. Too much of a sedative can produce general anesthesia. Monitored anesthesia care is performed by one or more health care providers who have special training in all types of anesthesia. You will need to meet with these health care providers before your procedure. During this meeting, they will ask you about your medical history. They will also give you instructions to follow. (For example, you will need to stop eating and drinking before your procedure. You may also need to stop or change medicines you are taking.) During your procedure, your health care providers will stay with you. They will:   Watch your condition. This includes watching your blood pressure, breathing, and level of pain.   Diagnose and treat problems that occur.   Give medicines if they are needed. These may include calming medicines (sedatives) and anesthetics.   Make sure you are  comfortable.  Having monitored anesthesia care does not necessarily mean that you will be under anesthesia. It does mean that your health care providers will be able to manage anesthesia if you need it or if it occurs. It also means that you will be able to have a different type of anesthesia than you are having if you need it. When your procedure is complete, your health care providers will continue to watch your condition. They will make sure any medicines wear off before you are allowed to go home.  Document Released: 02/05/2005 Document Revised: 09/26/2013 Document Reviewed: 06/23/2012 [facility] Patient Information 2015 [place], [place]. This information is not intended to replace advice given to you by your health care provider. Make sure you discuss any questions you have with your health care provider. Transesophageal Echocardiogram Transesophageal echocardiography (TEE) is a special type of test that produces images of the heart by using sound waves (echocardiogram). This type of echocardiography can obtain better images of the heart than standard echocardiography. TEE is done by passing a flexible tube down the esophagus. The heart is located in front of the esophagus. Because the heart and esophagus are close to one another, your health care provider can take very clear, detailed pictures of the heart via ultrasound waves. TEE may be done:  If your health care provider needs more information based on standard echocardiography findings.  If you had a stroke. This might have happened because a clot formed in your heart. TEE can visualize different areas of the heart and check for clots.  To check valve anatomy and function.  To check for  infection on the inside of your heart (endocarditis).  To evaluate the dividing wall (septum) of the heart and presence of a hole that did not close after birth (patent foramen ovale or atrial septal defect).  To help diagnose a tear in the wall of the aorta  (aortic dissection).  During cardiac valve surgery. This allows the surgeon to assess the valve repair before closing the chest.  During a variety of other cardiac procedures to guide positioning of catheters.  Sometimes before a cardioversion, which is a shock to convert heart rhythm back to normal. LET [facility] CARE PROVIDER KNOW ABOUT:   Any allergies you have.  All medicines you are taking, including vitamins, herbs, eye drops, creams, and over-the-counter medicines.  Previous problems you or members of your family have had with the use of anesthetics.  Any blood disorders you have.  Previous surgeries you have had.  Medical conditions you have.  Swallowing difficulties.  An esophageal obstruction. RISKS AND COMPLICATIONS  Generally, TEE is a safe procedure. However, as with any procedure, complications can occur. Possible complications include an esophageal tear (rupture). BEFORE THE PROCEDURE   Do not eat or drink for 6 hours before the procedure or as directed by your health care provider.  Arrange for someone to drive you home after the procedure. Do not drive yourself home. During the procedure, you will be given medicines that can continue to make you feel drowsy and can impair your reflexes.  An IV access tube will be started in the arm. PROCEDURE   A medicine to help you relax (sedative) will be given through the IV access tube.  A medicine may be sprayed or gargled to numb the back of the throat.  Your blood pressure, heart rate, and breathing (vital signs) will be monitored during the procedure.  The TEE probe is a long, flexible tube. The tip of the probe is placed into the back of the mouth, and you will be asked to swallow. This helps to pass the tip of the probe into the esophagus. Once the tip of the probe is in the correct area, your health care provider can take pictures of the heart.  TEE is usually not a painful procedure. You may feel the probe  press against the back of the throat. The probe does not enter the trachea and does not affect your breathing. AFTER THE PROCEDURE   You will be in bed, resting, until you have fully returned to consciousness.  When you first awaken, your throat may feel slightly sore and will probably still feel numb. This will improve slowly over time.  You will not be allowed to eat or drink until it is clear that the numbness has improved.  Once you have been able to drink, urinate, and sit on the edge of the bed without feeling sick to your stomach (nausea) or dizzy, you may be cleared to go home.  You should have a friend or family member with you for the next 24 hours after your procedure. Document Released: 08/02/2002 Document Revised: 05/17/2013 Document Reviewed: 11/11/2012 [facility] Patient Information 2015 [place], [place]. This information is not intended to replace advice given to you by your health care provider. Make sure you discuss any questions you have with your health care provider.

## 2013-12-22 NOTE — Anesthesia Preprocedure Evaluation (Addendum)
Anesthesia Evaluation  Patient identified by MRN, date of birth, ID band Patient awake    Reviewed: Allergy & Precautions, H&P , NPO status , Patient's Chart, lab work & pertinent test results  Airway Mallampati: II TM Distance: >3 FB Neck ROM: Full    Dental   Pulmonary COPDformer smoker,          Cardiovascular hypertension, Pt. on medications + CAD, + Past MI, + Peripheral Vascular Disease and +CHF + dysrhythmias + Cardiac Defibrillator Rhythm:Irregular     Neuro/Psych    GI/Hepatic   Endo/Other  diabetes, Type 2, Oral Hypoglycemic Agents  Renal/GU Renal InsufficiencyRenal disease     Musculoskeletal   Abdominal (+) + obese,   Peds  Hematology   Anesthesia Other Findings   Reproductive/Obstetrics                          Anesthesia Physical Anesthesia Plan  ASA: IV  Anesthesia Plan: MAC   Post-op Pain Management:    Induction: Intravenous  Airway Management Planned: Simple Face Mask  Additional Equipment:   Intra-op Plan:   Post-operative Plan:   Informed Consent: I have reviewed the patients History and Physical, chart, labs and discussed the procedure including the risks, benefits and alternatives for the proposed anesthesia with the patient or authorized representative who has indicated his/her understanding and acceptance.     Plan Discussed with: Anesthesiologist, Surgeon and CRNA  Anesthesia Plan Comments:        Anesthesia Quick Evaluation

## 2013-12-22 NOTE — Interval H&P Note (Signed)
History and Physical Interval Note:  12/22/2013 11:10 AM  [name]  has presented today for surgery, with the diagnosis of AFLUTTER  The various methods of treatment have been discussed with the patient and family. After consideration of risks, benefits and other options for treatment, the patient has consented to  Procedure(s): TRANSESOPHAGEAL ECHOCARDIOGRAM (TEE) (N/A) CARDIOVERSION (N/A) as a surgical intervention .  The patient's history has been reviewed, patient examined, no change in status, stable for surgery.  I have reviewed the patient's chart and labs.  Questions were answered to the patient's satisfaction.     [name].

## 2013-12-23 ENCOUNTER — Encounter: Payer: Self-pay | Admitting: Nurse Practitioner

## 2013-12-23 ENCOUNTER — Ambulatory Visit (INDEPENDENT_AMBULATORY_CARE_PROVIDER_SITE_OTHER): Payer: Medicare Other | Admitting: Nurse Practitioner

## 2013-12-23 ENCOUNTER — Telehealth: Payer: Self-pay | Admitting: Internal Medicine

## 2013-12-23 VITALS — BP 105/70 | HR 86 | Temp 97.6°F | Ht 66.5 in | Wt 168.0 lb

## 2013-12-23 DIAGNOSIS — I2510 Atherosclerotic heart disease of native coronary artery without angina pectoris: Secondary | ICD-10-CM

## 2013-12-23 DIAGNOSIS — T148XXA Other injury of unspecified body region, initial encounter: Secondary | ICD-10-CM

## 2013-12-23 DIAGNOSIS — IMO0002 Reserved for concepts with insufficient information to code with codable children: Secondary | ICD-10-CM

## 2013-12-23 NOTE — Progress Notes (Signed)
Pre visit review using our clinic review tool, if applicable. No additional management support is needed unless otherwise documented below in the visit note. 

## 2013-12-23 NOTE — Telephone Encounter (Addendum)
Called pt to set up fu to discuss TEE results with [name] per [employer] for 12-29-13 on Dr Lovena Le schedule, wife said worried about pt, listless, Color not good, just not doing well, also has bleeding wound on forearm from tripping over dog and falling but due to Eliquis bleeding quite a bit, went to PCP today for wound care and back on Monday for dressing change, would like a call on Monday, knows Claiborne Billings not here today

## 2013-12-23 NOTE — Patient Instructions (Signed)
Leave clear bandage in place until return visit.  Purpose of absorbent bandage is to protect wound & catch drainage if clear bandage gets torn. Remove absorbent bandage when showering to keep it dry.  See you on Monday to redress wound.

## 2013-12-23 NOTE — Progress Notes (Signed)
Subjective:     [name] is a 78 y.o. male and is here for dressing change of skin tear on R forearm. Pt is accompanied by wife. He is new to me, Dr Lenna Gilford is his PCP. Pt fell at home night before last-tripped over dog, hitting R forearm. He was in hospital yesterday for TEE and a nurse dressed the wound. Pt was told to change dressing in 3 days. Wife became concerned that there is bloody drainage on bandage that was not there yesterday. Pt was started on eliquis about 1 week ago, per cardiology notes. History   Social History  . Marital Status: Married    Spouse Name: N/A    Number of Children: N/A  . Years of Education: N/A   Occupational History  . Not on file.   Social History Main Topics  . Smoking status: Former Smoker -- 2.00 packs/day for 50 years    Types: Cigarettes    Quit date: 06/24/2000  . Smokeless tobacco: Never Used  . Alcohol Use: Yes     Comment: 05/24/2012 "used to drink alot; quit > 10 yr ago"  . Drug Use: No  . Sexual Activity: No   Other Topics Concern  . Not on file   Social History Narrative  . No narrative on file   Health Maintenance  Topic Date Due  . Foot Exam  08/05/1947  . Ophthalmology Exam  [birth year]05/1945  . Urine Microalbumin  08/05/1947  . Tetanus/tdap  08/04/1956  . Zostavax  08/04/1997  . Influenza Vaccine  12/24/2013  . Hemoglobin A1c  03/02/2014  . Colonoscopy  12/08/2022  . Pneumococcal Polysaccharide Vaccine Age 65 And Over  Completed    The following portions of the patient's history were reviewed and updated as appropriate: allergies, current medications, past medical history, past surgical history and problem list.  Review of Systems Pertinent items are noted in HPI.   Objective:    BP 105/70  Pulse 86  Temp(Src) 97.6 F (36.4 C) (Temporal)  Ht 5' 6.5" (1.689 m)  Wt 168 lb (76.204 kg)  BMI 26.71 kg/m2  SpO2 95% General appearance: alert, cooperative, appears stated age and no distress Head: Normocephalic,  without obvious abnormality, atraumatic Eyes: negative findings: lids and lashes normal and conjunctivae and sclerae normal Skin: ecchymoses - arm(s) bilateral and hematoma 3 cm R forearm w/small skin tear. Entire wound is visible & covered w/tegaderm. Small amount of bloody drainage has seeped between skin & bandage. Appears to have stopped bleeding.    Assessment:  1. Skin tear Wound secure w/tegaderm.  Applied gauze over tegaderm in case bandage rupture/breaks & wrapped snugly w/ace. See pt instructions. F/u 3 days to change bandage.

## 2013-12-24 DIAGNOSIS — I8290 Acute embolism and thrombosis of unspecified vein: Secondary | ICD-10-CM

## 2013-12-24 HISTORY — DX: Acute embolism and thrombosis of unspecified vein: I82.90

## 2013-12-26 ENCOUNTER — Encounter (HOSPITAL_COMMUNITY): Payer: Self-pay | Admitting: Cardiovascular Disease

## 2013-12-26 ENCOUNTER — Ambulatory Visit (INDEPENDENT_AMBULATORY_CARE_PROVIDER_SITE_OTHER): Payer: Medicare Other | Admitting: Nurse Practitioner

## 2013-12-26 VITALS — BP 102/66 | HR 77 | Temp 96.6°F | Resp 18 | Ht 66.5 in | Wt 167.0 lb

## 2013-12-26 DIAGNOSIS — T148XXA Other injury of unspecified body region, initial encounter: Secondary | ICD-10-CM

## 2013-12-26 DIAGNOSIS — I2510 Atherosclerotic heart disease of native coronary artery without angina pectoris: Secondary | ICD-10-CM

## 2013-12-26 DIAGNOSIS — IMO0002 Reserved for concepts with insufficient information to code with codable children: Secondary | ICD-10-CM

## 2013-12-26 NOTE — Telephone Encounter (Addendum)
Spoke with wife and she is just worried about patient.  I gave her his potassium results and let her know Dr Lovena Le will be glad to help him on Thurs.  He is going to his PCP to have the skin tear redressed.  She is anxious about the next step for him.  Hopefully the blood cultures will be back on thurs so we can have a plan

## 2013-12-26 NOTE — Patient Instructions (Signed)
Leave dressing in place. Ok to shower. If dressing loosens on edges, reinforce. If wound gets wet. Take dressing off & redress as I did in office today: irrigate with saline. Pat dry with clean gauze. Apply vaseline gauze. Apply tegaderm.    Return in one week for re-evaluation.

## 2013-12-26 NOTE — Assessment & Plan Note (Signed)
3cm X 2cm skin tear R forearm.  Cleaned & redressed. No signs of infection. Return 1 week for re-evaluation.

## 2013-12-26 NOTE — Progress Notes (Signed)
Pre visit review using our clinic review tool, if applicable. No additional management support is needed unless otherwise documented below in the visit note. 

## 2013-12-26 NOTE — Progress Notes (Signed)
Subjective:     [name] is a 78 y.o. male and is here for follow up of R forearm wound resulting in large skin tear. Pt tripped over dog, hitting R forearm on caouch causing skin tear. Wound was dressed in hospital last week when he went in for TEE. Tegaderm was applied & had to be reinforced due to bleeding before he leaft hospital same day. He presented 3 da with medium sized hematoma under dressing, covering about 1/3 of dressing. Pt was instructed to leave dressing in place. I reinforced with gauze & ace bandage. He returns today for dressing change. Dressing has been in place for 5 days. History   Social History  . Marital Status: Married    Spouse Name: N/A    Number of Children: N/A  . Years of Education: N/A   Occupational History  . Not on file.   Social History Main Topics  . Smoking status: Former Smoker -- 2.00 packs/day for 50 years    Types: Cigarettes    Quit date: 06/24/2000  . Smokeless tobacco: Never Used  . Alcohol Use: Yes     Comment: 05/24/2012 "used to drink alot; quit > 10 yr ago"  . Drug Use: No  . Sexual Activity: No   Other Topics Concern  . Not on file   Social History Narrative  . No narrative on file   Health Maintenance  Topic Date Due  . Foot Exam  08[birth year]05/1947  . Ophthalmology Exam  08/05/1947  . Urine Microalbumin  08/05/1947  . Tetanus/tdap  08/04/1956  . Zostavax  08/04/1997  . Influenza Vaccine  12/24/2013  . Hemoglobin A1c  03/02/2014  . Colonoscopy  12/08/2022  . Pneumococcal Polysaccharide Vaccine Age 44 And Over  Completed    The following portions of the patient's history were reviewed and updated as appropriate: allergies, current medications, past medical history, past social history, past surgical history and problem list.  Review of Systems Pertinent items are noted in HPI.   Objective:    BP 102/66  Pulse 77  Temp(Src) 96.6 F (35.9 C) (Temporal)  Resp 18  Ht 5' 6.5" (1.689 m)  Wt 167 lb (75.751 kg)  BMI  26.55 kg/m2  SpO2 99% General appearance: alert, cooperative, appears stated age, no distress and accompanied by wife Head: Normocephalic, without obvious abnormality, atraumatic Eyes: negative findings: lids and lashes normal and conjunctivae and sclerae normal Skin: old dressing removed-3 cm X 2cm skintear, no active bleeding, areas of coagulated splinter hemorrhage noted. See procedure note.   Procedure Note: Loosened old bandage with saline & hydrogen peroxide. Brown drainage covering 1/2 4X4 gauze. Removed tegaderm. Irrigated wound with saline & hydrogen peroxide. Trimmed loose skin away. Wound bed pink w/dark areas of coagulated splinter hemorrhage. No active bleeding. No S&S of infection. Patted dry w/clean gauze. Applied vaseline gauze. Secured w/tegaderm. Wrapped w/ACE bandage. Assessment:   1. Skin tear See problem list for complete A&P See pt instructions. F/u 1 week.

## 2013-12-28 LAB — CULTURE, BLOOD (ROUTINE X 2)
Culture: NO GROWTH
Culture: NO GROWTH

## 2013-12-29 ENCOUNTER — Ambulatory Visit (INDEPENDENT_AMBULATORY_CARE_PROVIDER_SITE_OTHER): Payer: Medicare Other | Admitting: Internal Medicine

## 2013-12-29 ENCOUNTER — Encounter: Payer: Self-pay | Admitting: Internal Medicine

## 2013-12-29 VITALS — BP 116/74 | HR 79 | Ht 67.0 in | Wt 167.2 lb

## 2013-12-29 DIAGNOSIS — I2510 Atherosclerotic heart disease of native coronary artery without angina pectoris: Secondary | ICD-10-CM

## 2013-12-29 DIAGNOSIS — I4892 Unspecified atrial flutter: Secondary | ICD-10-CM

## 2013-12-29 NOTE — Patient Instructions (Addendum)
Your physician recommends that you schedule a follow-up appointment in: 3-4 weeks with Dr Lovena Le  Will cut the Eliquis dose in 1/2 until arm heals take 1/2 tablet twice daily   Follow up with PCP about arm and redressing

## 2013-12-29 NOTE — Progress Notes (Signed)
HPI [name] returns today for followup. He is a pleasant 78 yo man with an ICM, chronic systolic CHF, HTN and DM. He initially presented with VT over 18 months ago and was placed on Mexilitine and received an ICD. Over the past few months and particularly over the past two weeks he has had increased peripheral edema, but no palpitations or ICD shock. He notes that two weeks ago he developed weakness, fatigue and edema. No syncope. He was found to be in a. Flutter. Started on Eliquis and set up DCCV/Cardioversion on 7/30.  TEE revealed severe LV dysfunction EF 20-25% with mobile mass on the ICD lead as it passes through the right atrium, vegetation vrs thrombus. Blood culture negative. The mass is mobile, measures approximately 1.0 in length and is about 24mm in width. DCCV was cancelled and plan is for patient to be on blood thinner x 4 weeks then pursue TEE/DCCV. Blood culture negative.   He also recently tripped over his dog with subsequent skin avulsion of rt forearm. PCP is caring for the wound and was last dressed on Monday. It continues to weep and is slightly odorous. Dressing removed .It does not appear angry/ no pus evident but might require a C&S. Encouraged to return to PCP sooner than recheck on Monday.  Continues to have issues with lower extremity edema with severe LV dysfunction, and RI with creatinine of 2.1, despite recent increase in diuretics. Does not elevate feet when sitting and this was encouraged.    Allergies  Allergen Reactions  . Niacin Itching    Niaspan      Current Outpatient Prescriptions  Medication Sig Dispense Refill  . amiodarone (PACERONE) 200 MG tablet Take 200 mg by mouth 2 (two) times daily.      Marland Kitchen apixaban (ELIQUIS) 5 MG TABS tablet Take 1 tablet (5 mg total) by mouth 2 (two) times daily.  60 tablet  6  . carvedilol (COREG) 6.25 MG tablet Take 6.25 mg by mouth 2 (two) times daily with a meal.      . enalapril (VASOTEC) 2.5 MG tablet Take  2.5 mg by mouth 2 (two) times daily.      . fenofibrate 160 MG tablet Take 160 mg by mouth daily.      . Fluticasone-Salmeterol (ADVAIR) 500-50 MCG/DOSE AEPB Inhale 1 puff into the lungs 2 (two) times daily.      . furosemide (LASIX) 40 MG tablet Take 40 mg by mouth 2 (two) times daily.      Marland Kitchen glyBURIDE-metformin (GLUCOVANCE) 2.5-500 MG per tablet Take 0.5-1 tablets by mouth 2 (two) times daily with a meal. 1 tablet in am and 1/2 tablet in pm      . Guaifenesin (MUCINEX MAXIMUM STRENGTH) 1200 MG TB12 Take 1 tablet by mouth 2 (two) times daily.      Marland Kitchen mexiletine (MEXITIL) 150 MG capsule Take 150 mg by mouth 2 (two) times daily.      . pravastatin (PRAVACHOL) 80 MG tablet Take 80 mg by mouth daily.      Marland Kitchen tiotropium (SPIRIVA) 18 MCG inhalation capsule Place 1 capsule (18 mcg total) into inhaler and inhale daily.  30 capsule  12   No current facility-administered medications for this visit.     Past Medical History  Diagnosis Date  . VENTRICULAR TACHYCARDIA   . PERIPHERAL VASCULAR DISEASE   . HYPERTENSION   . HYPERCHOLESTEROLEMIA   . CORONARY ARTERY DISEASE   . Chronic systolic heart failure   .  CAROTID ARTERY DISEASE   . SEBORRHEIC DERMATITIS   . RENAL INSUFFICIENCY   . DIVERTICULOSIS OF COLON   . CPK, ABNORMAL   . COPD   . COLONIC POLYPS   . CANDIDIASIS, ORAL   . ASTHMATIC BRONCHITIS, ACUTE   . ANXIETY   . ICD (implantable cardiac defibrillator) in place   . Myocardial infarction ?1990's    "silent" (05/24/2012)  . Chronic bronchitis     "every year" (05/24/2012)  . Type II diabetes mellitus   . Chronic lower back pain   . DJD (degenerative joint disease)     "hands" (05/24/2012)  . Osteoarthritis of finger     ROS:   All systems reviewed and negative except as noted in the HPI.   Past Surgical History  Procedure Laterality Date  . Coronary artery bypass graft  1990's    CABG X4  . Tonsillectomy  ?1942  . Cardiac defibrillator placement  12/2004    single chamber  defibrillator- Medtronic Maxima 8/06 DrKlein [Other][  . Tee without cardioversion N/A 12/22/2013    Procedure: TRANSESOPHAGEAL ECHOCARDIOGRAM (TEE);  Surgeon: Thayer Headings, MD;  Location: [place];  Service: Cardiovascular;  Laterality: N/A;  . Cardioversion N/A 12/22/2013    Procedure: CARDIOVERSION;  Surgeon: Thayer Headings, MD;  Location: [facility] ENDOSCOPY;  Service: Cardiovascular;  Laterality: N/A;     Family History  Problem Relation Age of Onset  . Heart attack Father   . Heart attack Brother      History   Social History  . Marital Status: Married    Spouse Name: N/A    Number of Children: N/A  . Years of Education: N/A   Occupational History  . Not on file.   Social History Main Topics  . Smoking status: Former Smoker -- 2.00 packs/day for 50 years    Types: Cigarettes    Quit date: 06/24/2000  . Smokeless tobacco: Never Used  . Alcohol Use: Yes     Comment: 05/24/2012 "used to drink alot; quit > 10 yr ago"  . Drug Use: No  . Sexual Activity: No   Other Topics Concern  . Not on file   Social History Narrative  . No narrative on file     BP 116/74  Pulse 79  Ht 5\' 7"  (1.702 m)  Wt 75.841 kg (167 lb 3.2 oz)  BMI 26.18 kg/m2  Physical Exam:  stable appearing 78 yo man, NAD HEENT: Unremarkable Neck:  7 cm JVD, no thyromegally Back:  No CVA tenderness Lungs:  Clear with few scattered basilar rales HEART:  Irregular rate rhythm, no murmurs, no rubs, no clicks Abd:  soft, positive bowel sounds, no organomegally, no rebound, no guarding Ext:  2 plus pulses, no edema, no cyanosis, no clubbing Skin:  No rashes no nodules Neuro:  CN II through XII intact, motor grossly intact  EKG - atrial flutter with a CVR  DEVICE  Not interrogated today.   Assess/Plan:  Aflutter with recent TEE showing rt atrial mass with DCCV aborted on 12/22/13. Blood culture negative, probable thrombus Continue on eliquis for 3 additional weeks and return to office to see  Dr. Lovena Le to be rescheduled for TEE/DCCV.  CHF Follows up lower extremity edema next Tuesday. Elevate feet when sitting and avoid salt.  Skin tear rt forearm with oozing Reduce eliquis to 2.5 mg bid until arm healed and  then return to full dose of anticoagulant. Encouraged to return to PCP for recheck this week.  Patient  seen in conjunction with Dr. Lovena Le.

## 2014-01-01 ENCOUNTER — Other Ambulatory Visit: Payer: Self-pay | Admitting: Internal Medicine

## 2014-01-02 ENCOUNTER — Ambulatory Visit: Payer: Medicare Other | Admitting: Nurse Practitioner

## 2014-01-02 ENCOUNTER — Encounter: Payer: Self-pay | Admitting: Pulmonary Disease

## 2014-01-02 ENCOUNTER — Ambulatory Visit (INDEPENDENT_AMBULATORY_CARE_PROVIDER_SITE_OTHER): Payer: Medicare Other | Admitting: Pulmonary Disease

## 2014-01-02 VITALS — BP 96/60 | HR 64 | Temp 97.4°F | Ht 66.5 in | Wt 164.8 lb

## 2014-01-02 DIAGNOSIS — K56609 Unspecified intestinal obstruction, unspecified as to partial versus complete obstruction: Secondary | ICD-10-CM

## 2014-01-02 DIAGNOSIS — J929 Pleural plaque without asbestos: Secondary | ICD-10-CM

## 2014-01-02 DIAGNOSIS — Z95810 Presence of automatic (implantable) cardiac defibrillator: Secondary | ICD-10-CM

## 2014-01-02 DIAGNOSIS — K5730 Diverticulosis of large intestine without perforation or abscess without bleeding: Secondary | ICD-10-CM

## 2014-01-02 DIAGNOSIS — M545 Low back pain, unspecified: Secondary | ICD-10-CM

## 2014-01-02 DIAGNOSIS — N259 Disorder resulting from impaired renal tubular function, unspecified: Secondary | ICD-10-CM

## 2014-01-02 DIAGNOSIS — K56699 Other intestinal obstruction unspecified as to partial versus complete obstruction: Secondary | ICD-10-CM

## 2014-01-02 DIAGNOSIS — I6529 Occlusion and stenosis of unspecified carotid artery: Secondary | ICD-10-CM

## 2014-01-02 DIAGNOSIS — I2510 Atherosclerotic heart disease of native coronary artery without angina pectoris: Secondary | ICD-10-CM

## 2014-01-02 DIAGNOSIS — I739 Peripheral vascular disease, unspecified: Secondary | ICD-10-CM

## 2014-01-02 DIAGNOSIS — J449 Chronic obstructive pulmonary disease, unspecified: Secondary | ICD-10-CM

## 2014-01-02 DIAGNOSIS — I4892 Unspecified atrial flutter: Secondary | ICD-10-CM

## 2014-01-02 DIAGNOSIS — M1990 Unspecified osteoarthritis, unspecified site: Secondary | ICD-10-CM

## 2014-01-02 DIAGNOSIS — I5022 Chronic systolic (congestive) heart failure: Secondary | ICD-10-CM

## 2014-01-02 DIAGNOSIS — E119 Type 2 diabetes mellitus without complications: Secondary | ICD-10-CM

## 2014-01-02 DIAGNOSIS — I10 Essential (primary) hypertension: Secondary | ICD-10-CM

## 2014-01-02 DIAGNOSIS — R091 Pleurisy: Secondary | ICD-10-CM

## 2014-01-02 DIAGNOSIS — D126 Benign neoplasm of colon, unspecified: Secondary | ICD-10-CM

## 2014-01-02 DIAGNOSIS — E7800 Pure hypercholesterolemia, unspecified: Secondary | ICD-10-CM

## 2014-01-02 DIAGNOSIS — F411 Generalized anxiety disorder: Secondary | ICD-10-CM

## 2014-01-02 NOTE — Patient Instructions (Signed)
Today we updated your med list in our EPIC system...    Continue your current medications the same...  Call for any questions...  Let's plan a follow up visit in 4-22mo, sooner if needed for breathing problems.Marland KitchenMarland Kitchen

## 2014-01-02 NOTE — Progress Notes (Signed)
Subjective:     Patient ID: [name], male   DOB: 1936/01/21, 78 y.o.   MRN: [id number]  HPI 78 y/o WM here for a follow up visit... he has multiple medical problems as noted below...  ~  SEE PREV EPIC NOTES FOR OLDER DATA >>   ~  November 15, 2012:  57moROV & RDeseanis c/o constipation, plus he is long overdue for f/u colon w/ hx adenomatous polyps; we decided to check KUB XRay=> air & stool scat about the colon but stool burden NOT substantially increased; we rec MIRALAX daily  & refer to GI as noted...  We reviewed the following medical problems during today's office visit >>     AB, COPD> on Advair500Bid, Xopenex prn, Mucinex-2Bid, off Pred; prev exac treated w/ Augmentin & Pred- improved; now stable w/ min cough, stable DOE etc...    HBP> on Coreg12.5Bid, Vasotec5Bid, Lasix40; BP= 120/70 & he feels the Lasix is too strong- labs show BUN=35 Cr=1.6 therefore rec decr to 1/2 tab daily (252m...     CAD> on ASA325; Hx Lmain + 3vessel dis & CABGx4 in 2003; f/u cath 2008 w/ atretic LIMA to LAD; he denies CP, palpit, dizzy, ch in SOB, edema...    CHF, Ischemic Cardiomyop> EF= 35-40% by 2DEcho 2/13; he continues regular f/u w/ DrHochrein...    Hx VTach, AICD> on Mexiletine150Bid; he is followed by DrKlein/Taylor & stable on Mexiletine- generator changed 12/13, seen 3/14 & stable, they incr Lasix to 40 but he feels it's too strong.    ASPVDz> on ASA325; Hx PTA to right SFA for ABI=0.36 in past; not active enough for claud; known plaque in Carotids w/o signif flow reduction...    CHOL> on Prav80, Feno160; FLP 6/14 showed TChol 69, TG 169, HDL 15, LDL 21; rec to continue meds better low fat diet, incr exerc...    DM> on Glucovance2.5-500 (oneQam & 1/2Qpm); labs today showed BS=111, A1c=6.9 and asked to get on better low carb diet!    GI- Divertics, Polyps> h[facility] adenomatous polyp in 1997, [facility]; he is overdue for f/u colon & c/o constip=> KUB w/o impaction,  start Miralax, refer to GI...    Renal Insuffic> baseline Creat= 1.4-1.5; labs today reveals BUN=35, Creat=1,6, K=5.6; he is rec to decr the Lasix40 back to 2045m...    DJD> followed by DrWainer w/ shots in shoulders in the past; now c/o LBP & we will Rx w/ rest, heat, Tramadol, Robaxin & refer to DrWainer...    Anxiety> aware- he is not on an anxiolytic med by his pref... We reviewed prob list, meds, xrays and labs> see below for updates >>   KUB XRay 6/14 showed air & stool scat about the colon but stool burden NOT substantially increased, DJD in TSpine...   LABS 6/14:  FLP- TChol&LDL are ok but HDL=15, TG=169;  Chems- showed BS=111 A1c=6.9, K=5.6 BUN=35 Cr=1.6;  CBC- ok;  TSH=2.10;  PSA=0.45...  ~  May 09, 2013:  59mo72mo & [name] for [facility] f/u & felt to be hi risk for colonoscopy; Virtual CT Colon was done 7/14 finding> stricture of the rectosigmoid w/ asymmetrical thickening of the rectosigmoid colon at 30 cm from the anal verge, worrisome for sigmoid colon carcinoma, 5 mm polyp at 8-10 cm from the anal verge, diverticulosis as well with multiple diverticula within the rectosigmoid colon; Probable noncalcified pleural plaques at both lung bases; Degenerative disc disease diffusely throughout the  lumbar spine with anterolisthesis of L3 on L4 by 5 mm most likely degenerative... He was sent to [facility] 7[facility]14> he rec surgery but he is obviously high risk he sent him to [facility] for clearance>>    He saw DrTaylor last 3/14> ischemic cardiomyop, chr sys CHF, Hx VT on Mexiletine150Bid & s/p ICD placement; norm device function, no shocks, Lasix incr to 39m/d, no salt etc...     He saw DrHochrein 7/14> CAD (on ASA325), ischemic cardiomyop, denies CP/ palpit, but weak & SOB, BP was low after virtual colon w/ diarrhea; he stopped his Lasix & decr Coreg to 6.25Bid and Lisin to 2.5Bid...     They did Myoview 8/14> LBBB on EKG, medium sized fixed infer wall defect c/w old IWMI, no reversible  ischemia noted, EF=34% w/ infer wall HK...     [facility] & [facility] discussed [facility]> pt states that he made up his own mind against surg, BMs are better on metamucil daily We reviewed prob list, meds, xrays and labs> see below for updates >> Given 2014 Flu vaccine today...   LABS 12/14:  Chems ok x BS=129, A1c=6.9, Cr=1.7;  LFTs are now normal off all Etoh;  CBC- ok w/ Hg=13.7, Fe=75 (15%);  CEA=5.3..Marland KitchenMarland Kitchen PET Scan => done 12/14 => focal thickening & assoc narrowing of sigmoid colon w/ abnormal hypermetabolism (colon ca vs inflamm), stool in colon, atherosclerotic vasc dis, asbestos related pleural dis bilat...  ADDENDUM>> pt understands options for 1) proceed w/ colonoscopy & bx;  2) proceed w/ definitive surg approach;  3) continue watchful waiting w/ f/u CEA and CT Abd in ?650mohe will need to be diligent about metamucil, miralax, etc to keep stools soft & moving so as to avoid constip/ obstruction thru the narrowed sigmoid region...   ~  August 31, 2013:  67m67moV & [name] here for follow up- he has done well from the GI standpoint, denies abd pain, n/v, c/d, blood seen; he uses metamucil & states his BMs are normal, no issues; we reviewed his prev work up & we will recheck CBC (wnl) & CEA level (up to 8.0) today... His CC today is no energy, decr stamina, and DOE that seems worse than before- eg taking out the garbage etc but he readily admits NOT exercising; also notes no CP, palpit, edema; he saw DrTaylor for Cards/EP 1/15> ischemic CM, hx VT, chr sysCHF w/ EF in the 35% range, s/pICD- everything stable, no shocks, told to avoid salt, same meds, encouraged exercise...  He does note some cough, sm amt of brown sput, denies f/c/s, no CP, etc... He is taking his Advair500Bid & Mucinex Bid; exam w/ decr BS but clear; see labs & studies below; we decided to treat by adding Spiriva, and a course of Augmentin & Pred...     We reviewed prob list, meds, xrays and labs> see below for updates >>    CXR 4/15 showed stable heart size, pacer, s/p CABG, COPD changes- NAD, calcif pleural plaques stable...  PFT 4/15 showed FVC=2.27 (59%), FEV1=0.69 (24%), %1sec=31, mid-flows=7% predicted... This is c/w GOLD Stage4 COPD...   LABS 4/15:  Chems- ok w/ BS=71 A1c=7.1 BUN=27 Cr=1.6 (stable);  CBC- wnl;  BNP=230;  CEA=8.0 (up from 5.3)       ADDENDUM>> I had a long discussion w/ pt; again reviewed his situation (I told him that I was very concerned that this represented colon cancer) and proposed the same 3 options (aggressive, middle-of-the-road, conservative)  w/ surg f/u DrNewman, colonoscopy w/ bx, continued watchful waiting w/ another CEA + f/u CT Abd in 3-36mo he didn't hesitate in his desire for conserv approach & will f/u in 3-446mos discussed; he will call for any questions in the interim; he does not want another opinion or f/u w/ his specialists at this time...  ~  January 02, 2014:  84m36moV & [name] w/ [facility]- seen recently after a fall at home w/ skin laceration w/ bleeding & Rx for skin tear... We reviewed the following medical problems during today's office visit >>     GOLD Stage4 COPD> on Advair500Bid, Spiriva, Mucinex; he states breathing at baseline w/ min cough, sput, no hemoptysis, stable DOE which he blames on his CHF; exam is clear w/ decr BS, no wheezing or congestion...     HBP> on Coreg6.25Bid, Vasotec5Bid, Lasix40Bid; BP= 96/60 & he feels weak, tired, fatigued; followed closely by DrTaylor, [facility]...    CAD> offASA now; Hx Lmain + 3vessel dis & CABGx4 in 2003; f/u cath 2008 w/ atretic LIMA to LAD; he denies CP, palpit, dizzy, ch in SOB, or ch in edema...    CHF, Ischemic Cardiomyop, ?clot on ICD lead in right atrium> Recent TEE (7/15) showed severe LVD w/ EF=20-25% & mobile mass on the ICD lead as it passes thru the right atrium- likely clot (on Eliquis now) & not vegetation (cultures neg)    Hx VTach, AICD, now AFlutter> on  Mexiletine150Bid, Amio200Bid, Eliquis5-1/2Bid; he is followed by DrTaylor- generator changed 12/13, seen 8/15 w/ plan for Eliquis x1mo77mon proceed w/ TEE/DCCV...    ASPVDz> off ASA on Eliquis now; Hx PTA to right SFA for ABI=0.36 in past; not active enough for claud; known plaque in Carotids w/o signif flow reduction...    CHOL> on Prav80, Feno160; FLP 6/14 showed TChol 69, TG 169, HDL 15, LDL 21; rec to continue meds better low fat diet, incr exerc...    DM> on Glucovance2.5-500 (oneQam & 1/2Qpm); labs 4/15 showed BS=71, A1c=7.1 and reminded to avoid sweets...    GI- Divertics, Polyps, narrowing in sigmoid w/ elev CEA & abn PET> hx adenomatous polyp in 1997, [facility], did virtual CT colon showing stricture of the rectosigmoid w/ asymmetrical thickening worrisome for sigmoid colon carcinoma, 5 mm polyp at 8-10 cm from the anal verge, diverticulosis as well with multiple diverticula within the rectosigmoid colon; DrNewman rec surg but pre-op evals showed high risk & pt opted for watchful waiting;  He has regular soft BMs w/ metamucil rx, no blood, not anemic, denies abn pain, but CEA is rising 5.3=>8.0 (4/15)...    Renal Insuffic> baseline Creat= 1.6-1.7; labs 7/15 on Lasix40Bid showed Cr=2.1 & Cards aware...    DJD, LBP> followed by DrWainer w/ shots in shoulders in the past; he uses rest, heat, OTC Tylenol as needed... He knows to avoid NSAIDs...    Anxiety> aware- he is not on an anxiolytic med by his pref... We reviewed prob list, meds, xrays and labs> see below for updates >>   LABS 7/15:  Chems- ok x BS=148, Cr=2.1;  CBC- ok w/ Hg=13.4...  Marland KitchenMarland Kitchen      Problem List:  GOLD Stage4 COPD (ICD-XNA-355 ex-smoker, quit 2002...  Hx ASTHMATIC BRONCHITIS >> occas acute infectious exacerbations... ~  12/10: had bronchitic exac Rx'd w/ Avelox, Pred, Mucinex, etc & improved==> CXR  showed COPD/E, incr interstitial  markings & scarring, calcif pleural plaques, enlarged heart & pacer, NAD.Marland Kitchen.  ~  7/11:  similar episode, responded to similar meds... ~  CXR 6/13 showed stable heart size s/p CABG, pacer, hyperinflated/ clear lungs w/ interstitial prominence, DJD in TSpine... ~  11/13: similar resp exac treated by TP w/ Augmentin & Pred- improved... ~  CXR 11/13 showed cardiomeg & prev CABG, left subclav AICD, atherosclerotic calcif of Ao, calcif pleural plaques, emphysema w/o acute changes... ~  12/14: on Advair500Bid, XopenexHFA prn, GFN1200Bid; breathing is stable w/o recent exac... ~  4/15: on Advair500Bid, XopenexHFA prn, Mucinex1200Bid; presents w/ incr dyspnea, no energy, no stamina, notes some cough/ brown phlegm, no hemoptysis; we added Spiriva daily + a course of Augmentin & Pred for 10d; really needs rehab/exercise... ~  CXR 4/15 showed stable heart size, pacer, s/p CABG, COPD changes- NAD, calcif pleural plaques stable... ~  PFT 4/15 showed FVC=2.27 (59%), FEV1=0.69 (24%), %1sec=31, mid-flows=7% predicted... This is c/w GOLD Stage4 COPD. ~  8/15:  Breathing is stable on ADVAIR500Bid & SPIRIVA daily + Mucinex; min cough/ sput, no hemoptysis, stable DOE; no recent resp exac...  HYPERTENSION (ICD-401.9) >>  ~  meds for BP & his Chr CHF= COREG 12.3mBid,  ENALAPRIL 584mid, & LASIX 2038m...   ~  labs 1/10 showed K= 5.6, BUN= 31, Creat= 1.9... AMarland KitchenE/ ARB stopped... pt didn't follow up. ~  8/10: Coreg + Enalapril restarted by DrHochrein... ~  labs 9/10 showed K=5.6, BUN=19, Creat=1.5 ~  labs 12/10 showed K=4.4, BUN= 32, Creat= 1.7, BNP= 102 ~  labs 4/11 showed K=5.1, BUN=16, Creat=1.4 ~  10/11: BP=122/68 today, and feeling well- he denies HA, fatigue, visual changes, CP, palipit, dizziness, syncope, edema, etc. ~  labs 2/13 showed K=5.7, BUN=27, Creat=1.5 ~  6/13: BP= 112/62 and he denies CP, palpit, dizzy, syncope, ch in SOB, edema... ~  Labs 6/13 showed K=5.5, BUN=21, Creat=1.4 ~  12/13: on Coreg12.5Bid,  Vasotec5Bid, Lasix20; BP= 100/58 & he denies CP, palpit, ch in SOB/DOE, edema, etc; we cut the LASIX to 23m15mn swelling due to Creat=1.8 ~  6/14: on Coreg12.5Bid, Vasotec5Bid, Lasix40; BP= 120/70 & he feels the Lasix is too strong- labs show BUN=35 Cr=1.6 therefore rec decr to 1/2 tab daily (23mg59m~  12/14: on Coreg6.25Bid, Vasotec2.5Bid, Lasix20; BP= 108/62 & he is feeling well & denies CP, palpit, ch in dyspnea/ edema/ etc... ~  4/15: on Coreg 6.25Bid, Vasotec2.5Bid, Lasix40;  BP= 110/60 but he is feeling weak, tired, no stamina, no energy w/ DOE sl worse; he has not been exercising & will try to slowly incr his exercise program... ~  8/15: on Coreg6.25Bid, Vasotec5Bid, Lasix40Bid; BP= 96/60 & he feels weak, tired, fatigued; followed closely by DrTaylor, [facility].  CORONARY ARTERY DISEASE (ICD-414.00) - no chest pains, no palpit, no defib discharges... ~ Hx 3 vessel CAD w/ CABG x4 in 2/03 for LMain dis & 3vessel dis + mod LVD EF=35% at that time... ~ Cath 7/06 w/ non-filling of SVG to PDA, & LIMA to LAD was atretic, w/ EF=45%... ~ Cath 10/08 w/ norm SVG to PDA, & atretic LIMA to LAD (due to good native vessel flow), EF=40%... note is made of a 40-50% LMain lesion... ~  EKG 1/13 showed SBrady, rightward axis, IVCD, NSSTTWA... ~  EKG 7/14 showed NSR, rate65, LBBB, T-wave abn... ~  Myoview 7/14> LBBB on EKG, medium sized fixed infer wall defect c/w old IWMI, no reversible ischemia noted, EF=34% w/ infer wall HK...Marland KitchenMarland Kitchen  CONGESTIVE HEART FAILURE, SYSTOLIC, CHRONIC (IEP-329.5) - ischemic cardiomyopathy & meds as above... followed by DrHochrein in the CHF clinic...  see labs above>> ~  seen 8/10 in CHF clinic- Coreg + Enalapril restarted, labs reviewed. ~  2DEcho 2/13 showed LV mildly dilated w/ diffuse HK, incr wall thickness c/w mild LVH, EF=35-40%, mild MR, mild LAdil... ~  Labs 6/13 showed Creat=1.4 & BNP=83 ~  Labs 12/13 showed Creat=1.8 and his Lasix20 was cut to just prn for swelling... ~   3/14: DrTaylor increased his Lasix to 29m/d; pt feels this is too strong for him... ~  6/14: Labs showed K=5.6 BUN=35 Cr=1.6 and he is asked to decr the Lasix back to 1/2 tab daily (239m... ~  7/14: He saw DrHochrein> CAD (on ASA325), ischemic cardiomyop, denies CP/ palpit, but weak & SOB, BP was low after virtual colon w/ diarrhea; he stopped his Lasix & decr Coreg to 6.25Bid and Lisin to 2.5Bid. ~  4/15:  He remains on ASA325, & Mexiletine150Bid per DrTaylor & Hochrein> denies CP, palpit, edema- just c/o incr DOE, BNP=230 on the Lasix40 & reminded to elim sodium... ~  7/15: TEE showed severe LVD w/ EF=20-25% & mobile mass on the ICD lead as it passes thru the right atrium- likely clot (on Eliquis now) & not vegetation (cultures neg)  Hx of VENTRICULAR TACHYCARDIA (ICD-427.1) - prev documented medication non-compliance w/ Amio & Mexilitene... DrHochrein has adjusted his meds... ATRIAL FLUTTER >> found 7/15 by DrTaylor... ~ hx VTach storm w/ AICD placed, followed by DrTaylor/Klein on meds... ~ DCBeaver0/08 on Amiodarone 20052m2 tabs twice daily, and MEXILETINE 150m68m... subseq decr Amio 200mg88m.. ~ f/u w/ DrKlein 9/09 w/ defib check- doing well... ~  f/u 9/10 w/ DrKlein on Mexilitene alone, Amio stopped for intolerance... doing well- no changes made. ~  f/u 1/13 w/ DrAllred> stable on Mexilitene, & no changes made... ~  12/13: DrTaylor fixed one lead & changed the AICD generator, he remains stable on the Mexilitene... ~  3/14: He saw DrTaylor> ischemic cardiomyop, chr sys CHF, Hx VT on Mexiletine150Bid & s/p ICD placement; norm device function, no shocks, Lasix incr to 40mg/21mo salt etc. ~  7/15: Found to be in AFlutter- TEE showed severe LVD w/ EF=20-25% & mobile mass on the ICD lead as it passes thru the right atrium- likely clot (on Eliquis now) & not vegetation (cultures neg); on Mexiletine150Bid, Amio200Bid, Eliquis5-1/2Bid; he is followed by DrTaylor- generator changed 12/13, seen 8/15 w/  plan for Eliquis x1mo th42moroceed w/ TEE/DCCV...  PERIPHERAL VASCULAR DISEASE (ICD-443.9) >> on ASA 325mg/d.40m s/p PTA to right SFA for an ABI of 0.36...  ~  CDopplers 10/09 showed bilat carotid disease w/ bruits and mod plaque in bulbs, 0-39% bilat... no change. ~  CDopplers 11/11 showed mod heterogeneous plaque in bulbs, 0-39% bilat ICA stenoses, f/u 94yrs... 1yrERCHOLESTEROLEMIA (ICD-272.0) - prev on Simvastatin 40mg/d, a9mhanged to PRAVASTATIN 80mg/d per20mochrein (due to interaction of Simva & AmiSedgwickBRATE 160mg/d adde3m09... ~  FLP 8/08 on Andersonva40 showed TChol 136, TG 297, HDL 22, LDL 69- but he was ?not taking meds regularly? he freq forgets. ~  FLP 8/09 on Hartshornev80 showed TChol 124, TG 257, HDL 24, LDL 65... rec to add Fenofibrate 160/d... ~  FLP 9/10 on Prav80+Feno160 showed TChol 137, TG 142, HDL 26, LDL 83... continue both meds. ~  FLP 4/11 on Prav80+Feno160 showed TChol 131, TG 139, HDL 33, LDL 70 ~  FLP 10/11 = pt  didn't go to the lab for blood work... ~  [place] 2/13 on Prav80+Feno160 showed TChol 136, TG 105, HDL 31, LDL 84 ~  FLP 6/14 on Prav80+Feno160 showed TChol 69, TG 169, HDL 15, LDL 21  DIABETES MELLITUS (ICD-250.00) - on GLUCOVANCE 2.5/500- 1/2 Bid... he states his home BS checks have all been around 100 w/ occas nocturnal hypoglycemia> advised to take PM dose at dinner, not bedtime. ~   prev BS=177, HgA1c=7.0 in Oct08... ~  labs 2/09 showed BS=83, HgA1c=6.8 ~  labs 8/09 showed BS= 118, HgA1c= 7.3 ~  labs 9/10 showed BS= 90, A1c= 7.2 ~  labs 4/11 showed BS= 65, A1c= 6.9 ~  labs 2/13 showed BS= 76-171 ~  Labs 6/13 showed BS= 67, A1c=7.0 ~  12/13: on Glucovance2.5-500; labs today showed BS=134, A1c=7.5 and asked to incr Glucov to an extra 1/2 tab in PM... ~  6/14: on Glucovance2.5-500 (oneQam & 1/2Qpm); labs today showed BS=111, A1c=6.9 and asked to get on better low carb diet! ~  12/14: on Glucovance2.5-500 (oneQam & 1/2Qpm); labs today showed BS=126,  A1c=6.9.Marland KitchenMarland Kitchen Continue same. ~  4/15: on Glucovance2.5-500 (oneQAM & 1/2QPM)l labs today showed BS=71, A1c=7.1  DIVERTICULOSIS OF COLON (ICD-562.10) - last colonoscopy was 10/02 by [facility] showing divertics only... prev polypectomy 1997 for tubular adenoma... overdue for f/u exam... COLONIC POLYPS (ICD-211.3) >>  NARROWING IN SIGMOID COLON w/ asymmetric thickening and elev CEA & abn hypermetabolism on PET scan >> ~  He saw [facility]14 for GI f/u & felt to be hi risk for colonoscopy... ~  Virtual CT Colon was done 7/14 finding> stricture of the rectosigmoid w/ asymmetrical thickening of the rectosigmoid colon at 30 cm from the anal verge, worrisome for sigmoid colon carcinoma, 5 mm polyp at 8-10 cm from the anal verge, diverticulosis as well with multiple diverticula within the rectosigmoid colon; Probable noncalcified pleural plaques at both lung bases; Degenerative disc disease diffusely throughout the lumbar spine with anterolisthesis of L3 on L4 by 5 mm most likely degenerative...  ~  He was sent to [facility] 7/14> he rec surgery but he is obviously high risk=> he sent him to [facility] for clearance=> pt states that he made p his own mind against surg, BMs are better on metamucil daily. ~  12/14: pt ret to see me for routine f/u visit & we reviewed his data from 7/14> we decided to check CEA=5.3 and PET Scan= focal thickening & assoc narrowing of sigmoid colon w/ abnormal hypermetabolism (colon ca vs inflamm), stool in colon, atherosclerotic vasc dis, asbestos related pleural dis bilat...  pt understands options for 1) proceed w/ colonoscopy & bx;  2) proceed w/ definitive surg approach;  3) continue watchful waiting w/ f/u CEA and CT Abd in ?11mo he will need to be diligent about metamucil, miralax, etc top keep stools soft & moving so as to avoid constip/ obstruction thru the narrowed sigmoid region...  ~  4/15: he has done well from the GI standpoint, denies abd pain, n/v, c/d, blood seen; he uses  metamucil & states his BMs are normal, no issues; we reviewed his prev work up & rechecked CBC (wnl) & CEA level (up to 8.0) today...  I had a long discussion w/ pt & told him that I was very concerned that this represented colon cancer and proposed the same 3 options (aggressive, middle-of-the-road, conservative) w/ surg f/u DrNewman, colonoscopy w/ bx, continued watchful waiting w/ another CEA + f/u CT Abd in 3-433mohe didn't hesitate in  his desire for conserv approach & will f/u in 3-38moas discussed; he will call for any questions in the interim; he does not want another opinion or f/u w/ his specialists at this time...  RENAL INSUFFICIENCY (ICD-588.9) - Creat in the 1.6-1.7 range... ~  labs 8/09 showed BUN= 14, Creat= 1.2 ~  labs 1/10 showed BUN= 31, Creat= 1.9 & ACE was held... f/u improved. ~  labs 9/10 showed BUN= 19, Creat= 1.5 ~  labs 4/11 showed BUN= 16, Creat= 1.4 ~  Labs 2/13 showed BUN= 27, Creat= 1.5 ~  Labs 6/13 showed BUN= 21, Creat= 1.4 ~  baseline Creat= 1.4-1.5; labs today reveals BUN=27 Creat=1,8 K=5.2; he is rec to decr the Lasix20 to just PRN for edema... ~  3/14:  DrTaylor increased the Lasix to 447md... ~  6/14:  Labs showed K=5.6 BUN=35 Cr=1.6 and he is asked to decr the Lasix back to 1/2 tab daily (2080m.. ~  12/14:  Labs showed K=5.1, BUN=28, Cr=1.7 ~  4/15::  Labs  Showed BUN=27, Cr=1.6 ~  7/15:  Labs showed BUN=35, Cr=2.1; Cards was reluctant to decr the Lasix due to edema in legs...  DEGENERATIVE JOINT DISEASE (ICD-715.90) ~  2/12:  Seen by DrWainer w/ bilat shoulder pain; XRays showed glenohumeral degen changes; given bilat injections w/ relief... ~  12/13:  He is c/o LBP> we rec refewrral to Ortho for further eval; Rx w/ rest, heat, TRAMADOL, ROBAXIN...  ANXIETY (ICD-300.00)  SEBORRHEIC DERMATITIS (ICD-690.10) - he has mod seb derm- discussed Rx w/ Selsun/ T-Gel/ Lotrisone Cream Prn... THIN SKIN >> he fell w/ bruising, ecchymses, skin avulsion right forearm-  dressed 7 followed by [facility] [place]...   Past Surgical History  Procedure Laterality Date  . Coronary artery bypass graft  1990's    CABG X4  . Tonsillectomy  ?1942  . Cardiac defibrillator placement  12/2004    single chamber defibrillator- Medtronic Maxima 8/06 DrKlein [Other][  . Tee without cardioversion N/A 12/22/2013    Procedure: TRANSESOPHAGEAL ECHOCARDIOGRAM (TEE);  Surgeon: PhiThayer HeadingsD;  [facility];  Laterality: N/A;  . Cardioversion N/A 12/22/2013    Procedure: CARDIOVERSION;  Surgeon: PhiThayer HeadingsD;  [facility];  Service: Cardiovascular;  Laterality: N/A;    Outpatient Encounter Prescriptions as of 01/02/2014  Medication Sig  . amiodarone (PACERONE) 200 MG tablet Take 200 mg by mouth 2 (two) times daily.  . aMarland Kitchenixaban (ELIQUIS) 5 MG TABS tablet Take 0.5 mg by mouth 2 (two) times daily.  . carvedilol (COREG) 6.25 MG tablet Take 6.25 mg by mouth 2 (two) times daily with a meal.  . enalapril (VASOTEC) 2.5 MG tablet Take 2.5 mg by mouth 2 (two) times daily.  . fenofibrate 160 MG tablet Take 160 mg by mouth daily.  . Fluticasone-Salmeterol (ADVAIR) 500-50 MCG/DOSE AEPB Inhale 1 puff into the lungs 2 (two) times daily.  . furosemide (LASIX) 40 MG tablet Take 40 mg by mouth 2 (two) times daily.  . gMarland KitchenyBURIDE-metformin (GLUCOVANCE) 2.5-500 MG per tablet Take 0.5-1 tablets by mouth 2 (two) times daily with a meal. 1 tablet in am and 1/2 tablet in pm  . Guaifenesin (MUCINEX MAXIMUM STRENGTH) 1200 MG TB12 Take 1 tablet by mouth 2 (two) times daily.  . mMarland Kitchenxiletine (MEXITIL) 150 MG capsule Take 150 mg by mouth 2 (two) times daily.  . pravastatin (PRAVACHOL) 80 MG tablet Take 80 mg by mouth daily.  . tMarland Kitchenotropium (SPIRIVA) 18 MCG inhalation capsule Place 1 capsule (  18 mcg total) into inhaler and inhale daily.  . [DISCONTINUED] apixaban (ELIQUIS) 5 MG TABS tablet Take 1 tablet (5 mg total) by mouth 2 (two) times daily.    Allergies   Allergen Reactions  . Niacin Itching    Niaspan     Current Medications, Allergies, Past Medical History, Past Surgical History, Family History, and Social History were reviewed in [employer] record.   Review of Systems        See HPI - all other systems neg except as noted...       The patient complains of dyspnea on exertion.  The patient denies anorexia, fever, weight loss, weight gain, vision loss, decreased hearing, hoarseness, chest pain, syncope, peripheral edema, prolonged cough, headaches, hemoptysis, abdominal pain, melena, hematochezia, severe indigestion/heartburn, hematuria, incontinence, muscle weakness, suspicious skin lesions, transient blindness, difficulty walking, depression, unusual weight change, abnormal bleeding, enlarged lymph nodes, and angioedema.     Objective:   Physical Exam    WD, WN, 78 y/o WM in NAD... GENERAL:  Alert & oriented; pleasant & cooperative... HEENT:  Brady/AT, EOM-wnl, PERRLA, EACs-clear, TMs-wnl, NOSE-clear, THROAT-clear & wnl. NECK:  Supple w/ fairROM; no JVD; normal carotid impulses w/o bruits; no thyromegaly or nodules palpated; no lymphadenopathy. CHEST:  Few scat rhonchi without wheezing, rales, or signs of consolid... HEART:  Regular Rhythm;  gr1/6 SEM, without rubs or gallops heard... ABDOMEN:  Soft & nontender; normal bowel sounds; no organomegaly or masses detected. EXT: without deformities, mild arthritic changes; no varicose veins/ venous insuffic/ or edema. NEURO:  CN's intact; motor testing normal; sensory testing normal; gait normal & balance OK. DERM:  No lesions noted; no rash etc...  RADIOLOGY DATA:  Reviewed in the EPIC EMR & discussed w/ the patient...  LABORATORY DATA:  Reviewed in the EPIC EMR & discussed w/ the patient...    Assessment:      COPD/ AB>  PFTs show GOLD Stage4 COPD; we decided to continue Advair, Spiriva, Xopenex prn, Mucinex; asked to take meds regularly & incr  exercise... 4/15> we will treat w/ augmentin & Pred for his resp symptoms, incr SOB, etc... 8/15> back to baseline breathing, no exac; currently on Eliquis awaiting TEE/ DCCV by Cards...  HBP>  Controlled on meds, BP sl low today- may need to decr the diuretic, continue same for now 7 we will leave this to Cards...  CAD>  S/p CABG, he is too sedentary & asked to incr exercise; no ischemia on Myoview 7/14...  CHF, Cardiomyopathy>  Followed by DrHochrein & Lovena Le; continue meds...  Hx VTach, now AFlutter>  on Mexilitene & Amio; has AICD w/ prob clot on lead in right atrium=> on Eliquis 7 they are planning TEE/ DCCV soon...  ASPVD>  He is s/p right SFA PTA & doing satis, but needs to incr exercise & continue ASA...  CHOL>  On Prav80 + Feno160; last FLP 6/14 looked good x lowHDL needs incr exercise...  DM>  On diet + Glucovance; A1c is improved on 1.5 tabs per day...  RECTOSIGMOID NARROWING >> w/ CEA 5.3=>8.0 and Abn PET scan as above; I told him I thought this was poss a colon cancer; we outlined his options and he again has chosen watchful waiting & declines colonoscopy or surgery; he will f/u in 3-40mo& sooner if needed...   Renal Insuffic>  Creat= 2.1 currently, due to incr Lasix to 479mBid by Cards, they are aware...  DJD>  Aware, stable on OTC meds...  Anxiety>  Stable &  not on anxiolytic Rx...     Plan:     Patient's Medications  New Prescriptions   No medications on file  Previous Medications   AMIODARONE (PACERONE) 200 MG TABLET    Take 200 mg by mouth 2 (two) times daily.   CARVEDILOL (COREG) 6.25 MG TABLET    Take 6.25 mg by mouth 2 (two) times daily with a meal.   ENALAPRIL (VASOTEC) 2.5 MG TABLET    Take 2.5 mg by mouth 2 (two) times daily.   FENOFIBRATE 160 MG TABLET    Take 160 mg by mouth daily.   FLUTICASONE-SALMETEROL (ADVAIR) 500-50 MCG/DOSE AEPB    Inhale 1 puff into the lungs 2 (two) times daily.   GLYBURIDE-METFORMIN (GLUCOVANCE) 2.5-500 MG PER TABLET    Take  0.5-1 tablets by mouth 2 (two) times daily with a meal. 1 tablet in am and 1/2 tablet in pm   GUAIFENESIN (MUCINEX MAXIMUM STRENGTH) 1200 MG TB12    Take 1 tablet by mouth 2 (two) times daily.   MEXILETINE (MEXITIL) 150 MG CAPSULE    Take 150 mg by mouth 2 (two) times daily.   PRAVASTATIN (PRAVACHOL) 80 MG TABLET    Take 80 mg by mouth daily.   TIOTROPIUM (SPIRIVA) 18 MCG INHALATION CAPSULE    Place 1 capsule (18 mcg total) into inhaler and inhale daily.  Modified Medications   Modified Medication Previous Medication   APIXABAN (ELIQUIS) 5 MG TABS TABLET apixaban (ELIQUIS) 5 MG TABS tablet      Take 0.5 mg by mouth 2 (two) times daily.    Take 1 tablet (5 mg total) by mouth 2 (two) times daily.   FUROSEMIDE (LASIX) 40 MG TABLET furosemide (LASIX) 40 MG tablet      Take 1 tablet daily as needed    TAKE ONE TABLET BY MOUTH DAILY  Discontinued Medications   FUROSEMIDE (LASIX) 40 MG TABLET    Take 40 mg by mouth 2 (two) times daily.

## 2014-01-03 ENCOUNTER — Ambulatory Visit (INDEPENDENT_AMBULATORY_CARE_PROVIDER_SITE_OTHER): Payer: Medicare Other | Admitting: Nurse Practitioner

## 2014-01-03 ENCOUNTER — Encounter: Payer: Self-pay | Admitting: Nurse Practitioner

## 2014-01-03 VITALS — BP 90/57 | HR 76 | Temp 97.8°F | Ht 66.5 in | Wt 163.0 lb

## 2014-01-03 DIAGNOSIS — I2510 Atherosclerotic heart disease of native coronary artery without angina pectoris: Secondary | ICD-10-CM

## 2014-01-03 DIAGNOSIS — T148XXA Other injury of unspecified body region, initial encounter: Secondary | ICD-10-CM

## 2014-01-03 DIAGNOSIS — IMO0002 Reserved for concepts with insufficient information to code with codable children: Secondary | ICD-10-CM

## 2014-01-03 NOTE — Progress Notes (Signed)
   Subjective:    Patient ID: [name], male    DOB: 1936/02/16, 78 y.o.   MRN: [id number]  HPI Comments: Pt returns for 3rd f/u of skin tear to R forearm. Wound is 48 days old.  Wound Check He was originally treated 10 to 14 days ago. Prior ED Treatment: skin tear. wife has been changing dressing every 5 days or so-using vaseline gauze & telfa. Cleaning w/normal saline. Maximum temperature: no fever. There has been bloody (purulent) discharge from the wound. There is no redness present. There is no swelling present. The pain has no pain. He has no difficulty moving the affected extremity or digit.      Review of Systems  Constitutional: Negative for fever and chills.  Skin: Positive for wound.       Objective:   Physical Exam  Vitals reviewed. Constitutional: He is oriented to person, place, and time. He appears well-developed and well-nourished. No distress.  Eyes: Conjunctivae are normal. Right eye exhibits no discharge. Left eye exhibits no discharge.  Cardiovascular: Normal rate.   Pulmonary/Chest: Effort normal. No respiratory distress.  Neurological: He is alert and oriented to person, place, and time.  Skin: Skin is warm.  Skin tear R forearm. Removed tegaderm, purulent sanguinous drainage. No odor. Cleaned with hydrogen peroxide & saline.  Cultured wound. Ecchymosis surrounding wound, slowly absorbing. Wound measures 3cm X 2 cm. No erythema or swelling. Applied vaseline gauze, telfa, & wrapped w/coban.  Psychiatric: He has a normal mood and affect. His behavior is normal. Thought content normal.          Assessment & Plan:  1. Skin tear - Wound culture  See problem list for complete A&P See pt instructions. F/u 10 days.

## 2014-01-03 NOTE — Patient Instructions (Signed)
My office will call with wound culture results and any needed treatment.   In the meantime, start daily dressing changes: wash with Dove bar soap using gentle friction. Rinse. Pat dry. Apply thin layer vaseline w/clean q-tip. Apply non-adherent bandage. Add gauze over that, if wound is draining. Wrap w/ACE bandage. No tape or adherent dressing on skin.  Return in 10 days for -re-evaluation.

## 2014-01-03 NOTE — Assessment & Plan Note (Addendum)
Purulent serosanguinous drainage. No odor. Cx sent. Will prescribe ceftin 250 mg bid X 10 d if staph. growth Cleaned & redressed. Instructed on daily dressing changes. F/u 10 days.

## 2014-01-03 NOTE — Progress Notes (Signed)
Pre visit review using our clinic review tool, if applicable. No additional management support is needed unless otherwise documented below in the visit note. 

## 2014-01-06 ENCOUNTER — Telehealth: Payer: Self-pay | Admitting: Nurse Practitioner

## 2014-01-06 DIAGNOSIS — T798XXD Other early complications of trauma, subsequent encounter: Secondary | ICD-10-CM

## 2014-01-06 NOTE — Telephone Encounter (Signed)
pls call pt: Advise No bacterial growth in wound. No need to take antibiotics. Continue with daily dressing changes as discussed.If wound continues to heal, they may cancel return appt.

## 2014-01-06 NOTE — Telephone Encounter (Signed)
LMOVM for pt to return call 

## 2014-01-08 LAB — WOUND CULTURE
GRAM STAIN: NONE SEEN
GRAM STAIN: NONE SEEN
Gram Stain: NONE SEEN

## 2014-01-08 MED ORDER — SULFAMETHOXAZOLE-TMP DS 800-160 MG PO TABS: 1.0000 | ORAL_TABLET | Freq: Two times a day (BID) | ORAL | Status: DC

## 2014-01-08 NOTE — Telephone Encounter (Signed)
Final wound culture report shows staph, sensitive to bactrim Pt states wound is healing , getting smaller, no erythema, drainage, or odor. Will hold off on antibiotic, but Will prescribe since out of town.  Pt will fill if wound changes: delayed healing, drainage, odor, erythema around wound, streaks, fever.

## 2014-01-10 ENCOUNTER — Telehealth: Payer: Self-pay | Admitting: Internal Medicine

## 2014-01-10 NOTE — Telephone Encounter (Signed)
Patient wife called back say that pt's wound is healing and pt is not having problems with wound.

## 2014-01-10 NOTE — Telephone Encounter (Signed)
New message    Wife calling to discuss her husband condition. In [place] now on they way home .    Recently in the hospital

## 2014-01-10 NOTE — Telephone Encounter (Signed)
LMTCB ./CY 

## 2014-01-11 ENCOUNTER — Ambulatory Visit (INDEPENDENT_AMBULATORY_CARE_PROVIDER_SITE_OTHER): Payer: Medicare Other | Admitting: Family Medicine

## 2014-01-11 ENCOUNTER — Encounter: Payer: Self-pay | Admitting: Family Medicine

## 2014-01-11 VITALS — BP 90/67 | HR 89 | Temp 98.0°F | Resp 22 | Ht 66.5 in | Wt 165.0 lb

## 2014-01-11 DIAGNOSIS — R160 Hepatomegaly, not elsewhere classified: Secondary | ICD-10-CM

## 2014-01-11 DIAGNOSIS — N183 Chronic kidney disease, stage 3 unspecified: Secondary | ICD-10-CM

## 2014-01-11 DIAGNOSIS — I2510 Atherosclerotic heart disease of native coronary artery without angina pectoris: Secondary | ICD-10-CM

## 2014-01-11 DIAGNOSIS — E119 Type 2 diabetes mellitus without complications: Secondary | ICD-10-CM

## 2014-01-11 LAB — COMPREHENSIVE METABOLIC PANEL
ALBUMIN: 3.4 g/dL — AB (ref 3.5–5.2)
ALK PHOS: 52 U/L (ref 39–117)
ALT: 18 U/L (ref 0–53)
AST: 55 U/L — ABNORMAL HIGH (ref 0–37)
BUN: 46 mg/dL — AB (ref 6–23)
CO2: 32 mEq/L (ref 19–32)
Calcium: 9 mg/dL (ref 8.4–10.5)
Chloride: 86 mEq/L — ABNORMAL LOW (ref 96–112)
Creatinine, Ser: 2.7 mg/dL — ABNORMAL HIGH (ref 0.4–1.5)
GFR: 24.41 mL/min — ABNORMAL LOW (ref >60.00)
Glucose, Bld: 74 mg/dL (ref 70–99)
POTASSIUM: 4.6 meq/L (ref 3.5–5.1)
SODIUM: 127 meq/L — AB (ref 135–145)
TOTAL PROTEIN: 6.5 g/dL (ref 6.0–8.3)
Total Bilirubin: 2.1 mg/dL — ABNORMAL HIGH (ref 0.2–1.2)

## 2014-01-11 LAB — HEMOGLOBIN A1C: Hgb A1c MFr Bld: 5.7 % (ref 4.6–6.5)

## 2014-01-11 MED ORDER — PIOGLITAZONE HCL 15 MG PO TABS: 15.0000 mg | ORAL_TABLET | Freq: Every day | ORAL | Status: DC

## 2014-01-11 NOTE — Patient Instructions (Signed)
Check your sugar once every day: one day check a fasting sugar and the next day check a sugar 2 hours after supper.

## 2014-01-11 NOTE — Progress Notes (Signed)
Pre visit review using our clinic review tool, if applicable. No additional management support is needed unless otherwise documented below in the visit note. 

## 2014-01-11 NOTE — Progress Notes (Signed)
OFFICE VISIT  02/10/2014   CC:  Chief Complaint  Patient presents with  . Blood Sugar Problem    EMS came to house this am, BS at 43.   HPI:   Patient is a 78 y.o. Caucasian male who presents for hypoglycemic episode last night.  He is a new pt to me today, transferred over from Dr. Jeannine Kitten practice b/c he is no longer doing primary care.    Poor appetite and suboptimal food intake lately--unclear how long but sounds like chronic and worsening, continued to take his meds as usual, he woke up early in the morning and was feeling sweaty and dizzy, was acting different to his wife, EMS was called and his glucose was 43 when they checked it.  No n/v/d.  Some occ bloating feeling in abd but no pain. No urinary complaints. No resp complaints.  Denies abd pain but wife and pt have noted increased abdominal girth lately.     Past Medical History  Diagnosis Date  . VENTRICULAR TACHYCARDIA   . PERIPHERAL VASCULAR DISEASE   . HYPERTENSION   . HYPERCHOLESTEROLEMIA   . CORONARY ARTERY DISEASE   . Chronic systolic heart failure   . CAROTID ARTERY DISEASE   . SEBORRHEIC DERMATITIS   . RENAL INSUFFICIENCY   . DIVERTICULOSIS OF COLON   . COPD   . COLONIC POLYPS   . ANXIETY   . ICD (implantable cardiac defibrillator) in place   . Chronic bronchitis     "every year" (05/24/2012)  . Type II diabetes mellitus   . Chronic lower back pain   . DJD (degenerative joint disease)     "hands" (05/24/2012)  . Osteoarthritis of finger   . History of atrial flutter   . Thrombus 12/2013    on ICD lead--started anticoag and his cardioversion for atrial flutter was postponed.    Past Surgical History  Procedure Laterality Date  . Coronary artery bypass graft  1990's    CABG X4  . Tonsillectomy  ?1942  . Cardiac defibrillator placement  12/2004    single chamber defibrillator- Medtronic Maxima 8/06 DrKlein [Other][  . Tee without cardioversion N/A 12/22/2013    Procedure: TRANSESOPHAGEAL  ECHOCARDIOGRAM (TEE);  Surgeon: Thayer Headings, MD;  Location: [facility];  Service: Cardiovascular;  Laterality: N/A;  . Cardioversion N/A 12/22/2013    Procedure: CARDIOVERSION;  Surgeon: Thayer Headings, MD;  Location: [facility] ENDOSCOPY;  Service: Cardiovascular;  Laterality: N/A;  . Cardiovascular stress test  01/12/13    myocard perf imaging: previous infarct with a moderately reduced EF as was known previously.  No new findings.     Outpatient Prescriptions Prior to Visit  Medication Sig Dispense Refill  . Fluticasone-Salmeterol (ADVAIR) 500-50 MCG/DOSE AEPB Inhale 1 puff into the lungs 2 (two) times daily.      . Guaifenesin (MUCINEX MAXIMUM STRENGTH) 1200 MG TB12 Take 1,200 mg by mouth 2 (two) times daily.       Marland Kitchen mexiletine (MEXITIL) 150 MG capsule Take 150 mg by mouth 2 (two) times daily.      . pravastatin (PRAVACHOL) 80 MG tablet Take 80 mg by mouth daily.      Marland Kitchen amiodarone (PACERONE) 200 MG tablet Take 200 mg by mouth 2 (two) times daily.      Marland Kitchen apixaban (ELIQUIS) 5 MG TABS tablet Take 2.5 mg by mouth 2 (two) times daily.       . carvedilol (COREG) 6.25 MG tablet Take 6.25 mg by mouth 2 (two) times  daily with a meal.      . enalapril (VASOTEC) 2.5 MG tablet Take 2.5 mg by mouth 2 (two) times daily.      . fenofibrate 160 MG tablet Take 160 mg by mouth daily.      . furosemide (LASIX) 40 MG tablet Take 1 tablet daily as needed  30 tablet  0  . glyBURIDE-metformin (GLUCOVANCE) 2.5-500 MG per tablet Take 0.5-1 tablets by mouth 2 (two) times daily with a meal. 1 tablet in am and 1/2 tablet in pm      . sulfamethoxazole-trimethoprim (BACTRIM DS) 800-160 MG per tablet Take 1 tablet by mouth 2 (two) times daily.  20 tablet  0  . tiotropium (SPIRIVA) 18 MCG inhalation capsule Place 1 capsule (18 mcg total) into inhaler and inhale daily.  30 capsule  12   No facility-administered medications prior to visit.    Allergies  Allergen Reactions  . Niacin Itching    Niaspan    ROS As per  HPI  PE: Blood pressure 90/67, pulse 89, temperature 98 F (36.7 C), temperature source Temporal, resp. rate 22, height 5' 6.5" (1.689 m), weight 165 lb (74.844 kg), SpO2 98.00%. Gen: Alert, well appearing.  Patient is oriented to person, place, time, and situation. AFFECT: pleasant, lucid thought and speech. KCL:EXNT: no injection, icteris, swelling, or exudate.  EOMI, PERRLA. Mouth: lips without lesion/swelling.  Oral mucosa pink and moist. Oropharynx without erythema, exudate, or swelling.  CV: RRR LUNGS: CTA bilat, nonlabored  LABS:  None today. Recent: HbA1c was 7.1% on 08/31/13  IMPRESSION AND PLAN:  DIABETES MELLITUS Hypoglycemic episodes lately.  Check HbA1c today. Will d/c glyburide/metformin. Given renal insufficiency we have limited choices for oral diabetic med treatment.  Will start trial of actos 15mg  qd, continue home gluc monitoring.  Chronic renal insufficiency Check lytes/cr today.  Hepatomegaly Check abd u/s. He has unexplained anorexia.  Will try to get old GI records (pt gives history of a work-up being done and a surgical consultation being done as well--although surgery was not done) to try to get clarity on past hx of this problem.  An After Visit Summary was printed and given to the patient.  FOLLOW UP: Return in about 3 weeks (around 02/01/2014) for f/u DM/CRI (30 min appt with me--[place]).

## 2014-01-12 ENCOUNTER — Ambulatory Visit (HOSPITAL_BASED_OUTPATIENT_CLINIC_OR_DEPARTMENT_OTHER): Payer: Medicare Other

## 2014-01-12 ENCOUNTER — Telehealth: Payer: Self-pay | Admitting: Family Medicine

## 2014-01-12 NOTE — Telephone Encounter (Signed)
Patient seen by PCP yesterday, 8/19 for hypoglycemia

## 2014-01-12 NOTE — Telephone Encounter (Signed)
Please advise 

## 2014-01-12 NOTE — Telephone Encounter (Signed)
Patient had another "diabetic episode" last night. Patient's wife is questioning his medication. She is worried that his heart won't tolerate the "episodes".

## 2014-01-12 NOTE — Telephone Encounter (Signed)
Spoke with wife earlier and gave her directions to stop all diabetes medications until further instruction from Dr Anitra Lauth. His A1C was at 5.7. [name] wife understood directions.

## 2014-01-24 ENCOUNTER — Encounter: Payer: Self-pay | Admitting: Cardiology

## 2014-01-24 ENCOUNTER — Inpatient Hospital Stay (HOSPITAL_COMMUNITY): Payer: Medicare Other

## 2014-01-24 ENCOUNTER — Other Ambulatory Visit: Payer: Self-pay | Admitting: Cardiology

## 2014-01-24 ENCOUNTER — Inpatient Hospital Stay (HOSPITAL_COMMUNITY)
Admission: AD | Admit: 2014-01-24 | Discharge: 2014-02-03 | DRG: 291 | Disposition: A | Discharge status: Home / Self Care | Payer: Medicare Other | Source: Ambulatory Visit | Attending: Internal Medicine | Admitting: Internal Medicine

## 2014-01-24 ENCOUNTER — Ambulatory Visit (INDEPENDENT_AMBULATORY_CARE_PROVIDER_SITE_OTHER): Payer: Medicare Other | Admitting: Cardiology

## 2014-01-24 ENCOUNTER — Other Ambulatory Visit: Payer: Self-pay | Admitting: Nurse Practitioner

## 2014-01-24 VITALS — BP 80/40 | HR 60 | Ht 67.0 in | Wt 172.0 lb

## 2014-01-24 DIAGNOSIS — N189 Chronic kidney disease, unspecified: Principal | ICD-10-CM

## 2014-01-24 DIAGNOSIS — Z860100 Personal history of colon polyps, unspecified: Secondary | ICD-10-CM

## 2014-01-24 DIAGNOSIS — G8929 Other chronic pain: Secondary | ICD-10-CM | POA: Diagnosis present

## 2014-01-24 DIAGNOSIS — Z7901 Long term (current) use of anticoagulants: Secondary | ICD-10-CM

## 2014-01-24 DIAGNOSIS — Z79899 Other long term (current) drug therapy: Secondary | ICD-10-CM

## 2014-01-24 DIAGNOSIS — I5022 Chronic systolic (congestive) heart failure: Secondary | ICD-10-CM | POA: Diagnosis present

## 2014-01-24 DIAGNOSIS — F411 Generalized anxiety disorder: Secondary | ICD-10-CM | POA: Diagnosis present

## 2014-01-24 DIAGNOSIS — I130 Hypertensive heart and chronic kidney disease with heart failure and stage 1 through stage 4 chronic kidney disease, or unspecified chronic kidney disease: Secondary | ICD-10-CM | POA: Diagnosis present

## 2014-01-24 DIAGNOSIS — K56609 Unspecified intestinal obstruction, unspecified as to partial versus complete obstruction: Secondary | ICD-10-CM | POA: Diagnosis present

## 2014-01-24 DIAGNOSIS — I739 Peripheral vascular disease, unspecified: Secondary | ICD-10-CM | POA: Diagnosis present

## 2014-01-24 DIAGNOSIS — E8779 Other fluid overload: Secondary | ICD-10-CM | POA: Diagnosis present

## 2014-01-24 DIAGNOSIS — I4892 Unspecified atrial flutter: Secondary | ICD-10-CM | POA: Diagnosis present

## 2014-01-24 DIAGNOSIS — I4729 Other ventricular tachycardia: Secondary | ICD-10-CM | POA: Diagnosis present

## 2014-01-24 DIAGNOSIS — R110 Nausea: Secondary | ICD-10-CM | POA: Diagnosis not present

## 2014-01-24 DIAGNOSIS — N183 Chronic kidney disease, stage 3 unspecified: Secondary | ICD-10-CM | POA: Diagnosis present

## 2014-01-24 DIAGNOSIS — I5023 Acute on chronic systolic (congestive) heart failure: Secondary | ICD-10-CM | POA: Diagnosis present

## 2014-01-24 DIAGNOSIS — E7800 Pure hypercholesterolemia, unspecified: Secondary | ICD-10-CM | POA: Diagnosis present

## 2014-01-24 DIAGNOSIS — M545 Low back pain, unspecified: Secondary | ICD-10-CM | POA: Diagnosis present

## 2014-01-24 DIAGNOSIS — I472 Ventricular tachycardia, unspecified: Secondary | ICD-10-CM | POA: Diagnosis present

## 2014-01-24 DIAGNOSIS — Z95810 Presence of automatic (implantable) cardiac defibrillator: Secondary | ICD-10-CM | POA: Diagnosis not present

## 2014-01-24 DIAGNOSIS — J449 Chronic obstructive pulmonary disease, unspecified: Secondary | ICD-10-CM | POA: Diagnosis present

## 2014-01-24 DIAGNOSIS — J4489 Other specified chronic obstructive pulmonary disease: Secondary | ICD-10-CM | POA: Diagnosis present

## 2014-01-24 DIAGNOSIS — Z8249 Family history of ischemic heart disease and other diseases of the circulatory system: Secondary | ICD-10-CM

## 2014-01-24 DIAGNOSIS — E119 Type 2 diabetes mellitus without complications: Secondary | ICD-10-CM | POA: Diagnosis present

## 2014-01-24 DIAGNOSIS — I2589 Other forms of chronic ischemic heart disease: Secondary | ICD-10-CM | POA: Diagnosis present

## 2014-01-24 DIAGNOSIS — I509 Heart failure, unspecified: Secondary | ICD-10-CM | POA: Diagnosis not present

## 2014-01-24 DIAGNOSIS — E871 Hypo-osmolality and hyponatremia: Secondary | ICD-10-CM | POA: Diagnosis not present

## 2014-01-24 DIAGNOSIS — Z951 Presence of aortocoronary bypass graft: Secondary | ICD-10-CM | POA: Diagnosis not present

## 2014-01-24 DIAGNOSIS — N170 Acute kidney failure with tubular necrosis: Secondary | ICD-10-CM | POA: Diagnosis present

## 2014-01-24 DIAGNOSIS — I2510 Atherosclerotic heart disease of native coronary artery without angina pectoris: Secondary | ICD-10-CM | POA: Diagnosis present

## 2014-01-24 DIAGNOSIS — Z888 Allergy status to other drugs, medicaments and biological substances status: Secondary | ICD-10-CM | POA: Diagnosis not present

## 2014-01-24 DIAGNOSIS — R570 Cardiogenic shock: Secondary | ICD-10-CM

## 2014-01-24 DIAGNOSIS — N179 Acute kidney failure, unspecified: Secondary | ICD-10-CM

## 2014-01-24 DIAGNOSIS — K5730 Diverticulosis of large intestine without perforation or abscess without bleeding: Secondary | ICD-10-CM | POA: Diagnosis present

## 2014-01-24 HISTORY — DX: Personal history of other diseases of the circulatory system: Z86.79

## 2014-01-24 HISTORY — PX: OTHER SURGICAL HISTORY: SHX169

## 2014-01-24 HISTORY — DX: Acute embolism and thrombosis of unspecified vein: I82.90

## 2014-01-24 LAB — COMPREHENSIVE METABOLIC PANEL
ALK PHOS: 80 U/L (ref 39–117)
ALT: 20 U/L (ref 0–53)
AST: 67 U/L — ABNORMAL HIGH (ref 0–37)
Albumin: 3.1 g/dL — ABNORMAL LOW (ref 3.5–5.2)
Anion gap: 12 (ref 5–15)
BUN: 64 mg/dL — ABNORMAL HIGH (ref 6–23)
CALCIUM: 9 mg/dL (ref 8.4–10.5)
CO2: 27 mEq/L (ref 19–32)
Chloride: 94 mEq/L — ABNORMAL LOW (ref 96–112)
Creatinine, Ser: 2.82 mg/dL — ABNORMAL HIGH (ref 0.50–1.35)
GFR calc non Af Amer: 20 mL/min — ABNORMAL LOW (ref >90)
GFR, EST AFRICAN AMERICAN: 23 mL/min — AB (ref >90)
GLUCOSE: 113 mg/dL — AB (ref 70–99)
Potassium: 5.7 mEq/L — ABNORMAL HIGH (ref 3.7–5.3)
SODIUM: 133 meq/L — AB (ref 137–147)
TOTAL PROTEIN: 6.4 g/dL (ref 6.0–8.3)
Total Bilirubin: 2.4 mg/dL — ABNORMAL HIGH (ref 0.3–1.2)

## 2014-01-24 LAB — TROPONIN I

## 2014-01-24 LAB — PRO B NATRIURETIC PEPTIDE: PRO B NATRI PEPTIDE: 6721 pg/mL — AB (ref 0–450)

## 2014-01-24 LAB — GLUCOSE, CAPILLARY: Glucose-Capillary: 213 mg/dL — ABNORMAL HIGH (ref 70–99)

## 2014-01-24 LAB — CBC WITH DIFFERENTIAL/PLATELET
BASOS PCT: 0 % (ref 0–1)
Basophils Absolute: 0 10*3/uL (ref 0.0–0.1)
EOS ABS: 0.1 10*3/uL (ref 0.0–0.7)
EOS PCT: 2 % (ref 0–5)
HCT: 34.8 % — ABNORMAL LOW (ref 39.0–52.0)
Hemoglobin: 11.9 g/dL — ABNORMAL LOW (ref 13.0–17.0)
LYMPHS ABS: 0.9 10*3/uL (ref 0.7–4.0)
Lymphocytes Relative: 11 % — ABNORMAL LOW (ref 12–46)
MCH: 30 pg (ref 26.0–34.0)
MCHC: 34.2 g/dL (ref 30.0–36.0)
MCV: 87.7 fL (ref 78.0–100.0)
Monocytes Absolute: 0.9 10*3/uL (ref 0.1–1.0)
Monocytes Relative: 11 % (ref 3–12)
Neutro Abs: 6.4 10*3/uL (ref 1.7–7.7)
Neutrophils Relative %: 76 % (ref 43–77)
PLATELETS: 242 10*3/uL (ref 150–400)
RBC: 3.97 MIL/uL — ABNORMAL LOW (ref 4.22–5.81)
RDW: 17.8 % — ABNORMAL HIGH (ref 11.5–15.5)
WBC: 8.4 10*3/uL (ref 4.0–10.5)

## 2014-01-24 LAB — CARBOXYHEMOGLOBIN
Carboxyhemoglobin: 1.3 % (ref 0.5–1.5)
METHEMOGLOBIN: 0.8 % (ref 0.0–1.5)
O2 Saturation: 49.3 %
Total hemoglobin: 12 g/dL — ABNORMAL LOW (ref 13.5–18.0)

## 2014-01-24 LAB — MAGNESIUM: Magnesium: 1.8 mg/dL (ref 1.5–2.5)

## 2014-01-24 LAB — MRSA PCR SCREENING: MRSA BY PCR: POSITIVE — AB

## 2014-01-24 LAB — TSH: TSH: 15.04 u[IU]/mL — AB (ref 0.350–4.500)

## 2014-01-24 MED ORDER — ATORVASTATIN CALCIUM 40 MG PO TABS
40.0000 mg | ORAL_TABLET | Freq: Every day | ORAL | Status: DC
  Administered 2014-01-24 – 2014-02-02 (×10): 40 mg via ORAL
  Filled 2014-01-24 (×11): qty 1

## 2014-01-24 MED ORDER — MILRINONE IN DEXTROSE 20 MG/100ML IV SOLN
0.2500 ug/kg/min | INTRAVENOUS | Status: DC
  Administered 2014-01-24 – 2014-01-26 (×4): 0.25 ug/kg/min via INTRAVENOUS
  Filled 2014-01-24 (×4): qty 100

## 2014-01-24 MED ORDER — APIXABAN 2.5 MG PO TABS
2.5000 mg | ORAL_TABLET | Freq: Two times a day (BID) | ORAL | Status: DC
  Administered 2014-01-24 – 2014-01-30 (×13): 2.5 mg via ORAL
  Filled 2014-01-24 (×15): qty 1

## 2014-01-24 MED ORDER — CARVEDILOL 3.125 MG PO TABS: 3.1250 mg | ORAL_TABLET | Freq: Two times a day (BID) | ORAL | Status: DC

## 2014-01-24 MED ORDER — ACETAMINOPHEN 325 MG PO TABS: 650.0000 mg | ORAL_TABLET | ORAL | Status: DC | PRN

## 2014-01-24 MED ORDER — FUROSEMIDE 10 MG/ML IJ SOLN
80.0000 mg | Freq: Two times a day (BID) | INTRAMUSCULAR | Status: DC
  Administered 2014-01-24 – 2014-01-26 (×4): 80 mg via INTRAVENOUS
  Filled 2014-01-24 (×6): qty 8

## 2014-01-24 MED ORDER — TIOTROPIUM BROMIDE MONOHYDRATE 18 MCG IN CAPS
18.0000 ug | ORAL_CAPSULE | Freq: Every day | RESPIRATORY_TRACT | Status: DC
  Administered 2014-01-25 – 2014-02-03 (×9): 18 ug via RESPIRATORY_TRACT
  Filled 2014-01-24 (×2): qty 5

## 2014-01-24 MED ORDER — INSULIN ASPART 100 UNIT/ML ~~LOC~~ SOLN
0.0000 [IU] | Freq: Three times a day (TID) | SUBCUTANEOUS | Status: DC
  Administered 2014-01-25: 2 [IU] via SUBCUTANEOUS
  Administered 2014-01-25 – 2014-01-27 (×5): 3 [IU] via SUBCUTANEOUS
  Administered 2014-01-27: 5 [IU] via SUBCUTANEOUS
  Administered 2014-01-28: 2 [IU] via SUBCUTANEOUS
  Administered 2014-01-28 – 2014-01-30 (×6): 3 [IU] via SUBCUTANEOUS
  Administered 2014-01-30: 5 [IU] via SUBCUTANEOUS
  Administered 2014-01-30: 2 [IU] via SUBCUTANEOUS
  Administered 2014-01-31: 3 [IU] via SUBCUTANEOUS
  Administered 2014-01-31: 8 [IU] via SUBCUTANEOUS
  Administered 2014-01-31: 2 [IU] via SUBCUTANEOUS
  Administered 2014-02-01: 5 [IU] via SUBCUTANEOUS
  Administered 2014-02-01: 2 [IU] via SUBCUTANEOUS
  Administered 2014-02-01 – 2014-02-02 (×2): 3 [IU] via SUBCUTANEOUS
  Administered 2014-02-02: 5 [IU] via SUBCUTANEOUS
  Administered 2014-02-02 – 2014-02-03 (×2): 2 [IU] via SUBCUTANEOUS

## 2014-01-24 MED ORDER — SODIUM CHLORIDE 0.9 % IV SOLN
INTRAVENOUS | Status: DC
  Administered 2014-01-24: 10 mL/h via INTRAVENOUS
  Administered 2014-01-25 – 2014-01-26 (×2): via INTRAVENOUS
  Administered 2014-01-27: 10 mL/h via INTRAVENOUS
  Administered 2014-01-29 – 2014-02-01 (×2): via INTRAVENOUS

## 2014-01-24 MED ORDER — MEXILETINE HCL 150 MG PO CAPS
150.0000 mg | ORAL_CAPSULE | Freq: Two times a day (BID) | ORAL | Status: DC
  Administered 2014-01-25 – 2014-02-03 (×19): 150 mg via ORAL
  Filled 2014-01-24 (×21): qty 1

## 2014-01-24 MED ORDER — AMIODARONE HCL 200 MG PO TABS
200.0000 mg | ORAL_TABLET | Freq: Two times a day (BID) | ORAL | Status: DC
  Administered 2014-01-24 – 2014-02-03 (×20): 200 mg via ORAL
  Filled 2014-01-24 (×21): qty 1

## 2014-01-24 MED ORDER — MUPIROCIN 2 % EX OINT
1.0000 "application " | TOPICAL_OINTMENT | Freq: Two times a day (BID) | CUTANEOUS | Status: AC
  Administered 2014-01-25 – 2014-01-29 (×10): 1 via NASAL
  Filled 2014-01-24 (×2): qty 22

## 2014-01-24 MED ORDER — FUROSEMIDE 10 MG/ML IJ SOLN: 40.0000 mg | Freq: Two times a day (BID) | INTRAMUSCULAR | Status: DC

## 2014-01-24 MED ORDER — SODIUM CHLORIDE 0.9 % IV SOLN: 250.0000 mL | INTRAVENOUS | Status: DC | PRN

## 2014-01-24 MED ORDER — CHLORHEXIDINE GLUCONATE CLOTH 2 % EX PADS
6.0000 | MEDICATED_PAD | Freq: Every day | CUTANEOUS | Status: AC
  Administered 2014-01-25 – 2014-01-29 (×5): 6 via TOPICAL

## 2014-01-24 MED ORDER — SODIUM CHLORIDE 0.9 % IJ SOLN: 3.0000 mL | INTRAMUSCULAR | Status: DC | PRN

## 2014-01-24 MED ORDER — MOMETASONE FURO-FORMOTEROL FUM 200-5 MCG/ACT IN AERO
2.0000 | INHALATION_SPRAY | Freq: Two times a day (BID) | RESPIRATORY_TRACT | Status: DC
  Administered 2014-01-24 – 2014-02-03 (×19): 2 via RESPIRATORY_TRACT
  Filled 2014-01-24: qty 8.8

## 2014-01-24 MED ORDER — SODIUM CHLORIDE 0.9 % IJ SOLN
3.0000 mL | Freq: Two times a day (BID) | INTRAMUSCULAR | Status: DC
  Administered 2014-01-24 – 2014-01-26 (×3): 3 mL via INTRAVENOUS
  Administered 2014-01-28: 10 mL via INTRAVENOUS
  Administered 2014-01-29 – 2014-02-01 (×6): 3 mL via INTRAVENOUS
  Administered 2014-02-02: 10 mL via INTRAVENOUS
  Administered 2014-02-03: 30 mL via INTRAVENOUS

## 2014-01-24 MED ORDER — FENOFIBRATE 160 MG PO TABS
160.0000 mg | ORAL_TABLET | Freq: Every day | ORAL | Status: DC
  Administered 2014-01-24 – 2014-01-25 (×2): 160 mg via ORAL
  Filled 2014-01-24 (×3): qty 1

## 2014-01-24 NOTE — Patient Instructions (Signed)
Your physician recommends that you schedule a follow-up appointment in: after your hospital visit  Go to admissions at [facility]

## 2014-01-24 NOTE — Progress Notes (Signed)
HPI The patient presented for routine followup today. He has an ischemic cardiomyopathy. He has recently been seeing Dr. Lovena Le as he has had atrial flutter. Initially he was going to have cardioversion. However, there was probably a thrombus on the ICD lead. On July 30 cardioversion was canceled after TEE and he has been anticoagulated instead.  [place] from the beach today when suddenly and he was so weak when he got out of the car mid way on the trip to go to the bathroom he was so weak that he fell.  He reports that he has had a 10 pound weight gain over the last week.  He has had a little salt over the last couple of days. He's had increased lower extremity swelling. He's had increased dyspnea walking just a few feet. He's not describing new PND or orthopnea though he does sleep with his head elevated. He does not describe chest pressure, neck or arm discomfort. He's not really noticing his palpitations. He reports that he has had very dark urine and dry mouth.   Allergies  Allergen Reactions  . Niacin Itching    Niaspan     Current Outpatient Prescriptions  Medication Sig Dispense Refill  . amiodarone (PACERONE) 200 MG tablet Take 200 mg by mouth 2 (two) times daily.      Marland Kitchen apixaban (ELIQUIS) 5 MG TABS tablet Take 0.5 mg by mouth 2 (two) times daily.      . carvedilol (COREG) 6.25 MG tablet Take 6.25 mg by mouth 2 (two) times daily with a meal.      . enalapril (VASOTEC) 2.5 MG tablet Take 2.5 mg by mouth 2 (two) times daily.      . fenofibrate 160 MG tablet Take 160 mg by mouth daily.      . Fluticasone-Salmeterol (ADVAIR) 500-50 MCG/DOSE AEPB Inhale 1 puff into the lungs 2 (two) times daily.      . furosemide (LASIX) 40 MG tablet 20 mg daily. Take 1 tablet daily as needed      . Guaifenesin (MUCINEX MAXIMUM STRENGTH) 1200 MG TB12 Take 1 tablet by mouth 2 (two) times daily.      Marland Kitchen mexiletine (MEXITIL) 150 MG capsule Take 150 mg by mouth 2 (two) times daily.      .  pioglitazone (ACTOS) 15 MG tablet Take 1 tablet (15 mg total) by mouth daily.  30 tablet  6  . pravastatin (PRAVACHOL) 80 MG tablet Take 80 mg by mouth daily.      Marland Kitchen tiotropium (SPIRIVA) 18 MCG inhalation capsule Place 1 capsule (18 mcg total) into inhaler and inhale daily.  30 capsule  12   No current facility-administered medications for this visit.    Past Medical History  Diagnosis Date  . VENTRICULAR TACHYCARDIA   . PERIPHERAL VASCULAR DISEASE   . HYPERTENSION   . HYPERCHOLESTEROLEMIA   . CORONARY ARTERY DISEASE   . Chronic systolic heart failure   . CAROTID ARTERY DISEASE   . SEBORRHEIC DERMATITIS   . RENAL INSUFFICIENCY   . DIVERTICULOSIS OF COLON   . COPD   . COLONIC POLYPS   . ASTHMATIC BRONCHITIS, ACUTE   . ANXIETY   . ICD (implantable cardiac defibrillator) in place   . Chronic bronchitis     "every year" (05/24/2012)  . Type II diabetes mellitus   . Chronic lower back pain   . DJD (degenerative joint disease)     "hands" (05/24/2012)  . Osteoarthritis of finger  Past Surgical History  Procedure Laterality Date  . Coronary artery bypass graft  1990's    CABG X4  . Tonsillectomy  ?1942  . Cardiac defibrillator placement  12/2004    single chamber defibrillator- Medtronic Maxima 8/06 DrKlein [Other][  . Tee without cardioversion N/A 12/22/2013    Procedure: TRANSESOPHAGEAL ECHOCARDIOGRAM (TEE);  Surgeon: Thayer Headings, MD;  Location: [place];  Service: Cardiovascular;  Laterality: N/A;  . Cardioversion N/A 12/22/2013    Procedure: CARDIOVERSION;  Surgeon: Thayer Headings, MD;  Location[facility] ENDOSCOPY;  Service: Cardiovascular;  Laterality: N/A;    Family History  Problem Relation Age of Onset  . Heart attack Father 39  . Heart attack Brother     Vague history    History   Social History  . Marital Status: Married    Spouse Name: N/A    Number of Children: N/A  . Years of Education: N/A   Occupational History  . Not on file.   Social  History Main Topics  . Smoking status: Former Smoker -- 2.00 packs/day for 50 years    Types: Cigarettes    Quit date: 06/24/2000  . Smokeless tobacco: Never Used  . Alcohol Use: Yes     Comment: 05/24/2012 "used to drink alot; quit > 10 yr ago"  . Drug Use: No  . Sexual Activity: No   Other Topics Concern  . Not on file   Social History Narrative  . No narrative on file    ROS:  Decreased appetite.  Otherwise as stated in the HPI and negative for all other systems.  PHYSICAL EXAM BP 80/40  Pulse 60  Ht 5\' 7"  (1.702 m)  Wt 172 lb (78.019 kg)  BMI 26.93 kg/m2 GEN:  No distress, chronically ill appearing.   NECK:   Jugular venous distention to the jaw at 90 degrees, waveform within normal limits, carotid upstroke brisk and symmetric, no bruits, no thyromegaly LYMPHATICS:  No cervical adenopathy LUNGS:  Clear to auscultation bilaterally BACK:  No CVA tenderness CHEST:  Unremarkable HEART:  S1 and S2 within normal limits, no S3, no S4, no clicks, no rubs, distant apical systolic murmur ABD:  Positive bowel sounds normal in frequency in pitch, no bruits, no rebound, no guarding, unable to assess midline mass or bruit with the patient seated. EXT:  2 plus pulses throughout, severe edema, no cyanosis no clubbing SKIN:  No rashes no nodules, bruising NEURO:  Cranial nerves II through XII grossly intact, motor grossly intact throughout PSYCH:  Cognitively intact, oriented to person place and time   EKG:  Pending  ASSESSMENT AND PLAN  ACUTE ON CHRONIC SYSTOLIC HF:  I suspect his having low output heart failure. I will admit him to the hospital. We will start with conservative therapy while we await his lab results. I will hold his ACE inhibitor as I expect his creatinine will be more elevated than his baseline. He will keep his feet elevated and wear compression stockings. I will reduce his beta blocker slightly. I will start with Lasix 40 mg twice a day. He might need inotropic  agents. We will repeat an echocardiogram. Likely during this hospitalization we will have to reevaluate for cardioversion.  FLUTTER:  He will continue his Eliquis and amiodarone. We will discuss with Dr. Lovena Le plans for cardioversion.    CKD:  As above.  GI MASS:  This was identified last year it was decided he would be managed conservatively and he's had no symptoms of  obstruction or bleeding. No further evaluation is indicated.  CAD:  Stress testing last year demonstrated previous infarct with a moderately reduced EF as was known previously. No new findings. No further ischemia workup is planned.

## 2014-01-24 NOTE — Procedures (Signed)
Central Venous Catheter Insertion Procedure Note BRYAN GOIN [id number] Jan 31, 1936  Procedure: Insertion of Central Venous Catheter Indications: Assessment of intravascular volume  Procedure Details Consent: Risks of procedure as well as the alternatives and risks of each were explained to the (patient/caregiver).  Consent for procedure obtained. Time Out: Verified patient identification, verified procedure, site/side was marked, verified correct patient position, special equipment/implants available, medications/allergies/relevent history reviewed, required imaging and test results available.  Performed  Maximum sterile technique was used including antiseptics, cap, gloves, gown, hand hygiene, mask and sheet. Skin prep: Chlorhexidine; local anesthetic administered A antimicrobial bonded/coated triple lumen catheter was placed in the right internal jugular vein using the Seldinger technique.  Evaluation Blood flow good Complications: No apparent complications Patient did tolerate procedure well. Chest X-ray ordered to verify placement.  CXR: normal.  Glori Bickers MD 01/24/2014, 7:29 PM

## 2014-01-24 NOTE — H&P (Signed)
HPI The patient presented for routine followup today. He has an ischemic cardiomyopathy. He has recently been seeing Dr. Lovena Le as he has had atrial flutter. Initially he was going to have cardioversion. However, there was probably a thrombus on the ICD lead. On July 30 cardioversion was canceled after TEE and he has been anticoagulated instead.  He was driving back from the beach today when suddenly and he was so weak when he got out of the car mid way on the trip to go to the bathroom he was so weak that he fell.  He reports that he has had a 10 pound weight gain over the last week.  He has had a little salt over the last couple of days. He's had increased lower extremity swelling. He's had increased dyspnea walking just a few feet. He's not describing new PND or orthopnea though he does sleep with his head elevated. He does not describe chest pressure, neck or arm discomfort. He's not really noticing his palpitations. He reports that he has had very dark urine and dry mouth.    Allergies   Allergen  Reactions   .  Niacin  Itching       Niaspan       Current Outpatient Prescriptions   Medication  Sig  Dispense  Refill   .  amiodarone (PACERONE) 200 MG tablet  Take 200 mg by mouth 2 (two) times daily.         Marland Kitchen  apixaban (ELIQUIS) 5 MG TABS tablet  Take 0.5 mg by mouth 2 (two) times daily.         .  carvedilol (COREG) 6.25 MG tablet  Take 6.25 mg by mouth 2 (two) times daily with a meal.         .  enalapril (VASOTEC) 2.5 MG tablet  Take 2.5 mg by mouth 2 (two) times daily.         .  fenofibrate 160 MG tablet  Take 160 mg by mouth daily.         .  Fluticasone-Salmeterol (ADVAIR) 500-50 MCG/DOSE AEPB  Inhale 1 puff into the lungs 2 (two) times daily.         .  furosemide (LASIX) 40 MG tablet  20 mg daily. Take 1 tablet daily as needed         .  Guaifenesin (MUCINEX MAXIMUM STRENGTH) 1200 MG TB12  Take 1 tablet by mouth 2 (two) times daily.         Marland Kitchen  mexiletine (MEXITIL) 150 MG capsule   Take 150 mg by mouth 2 (two) times daily.         .  pioglitazone (ACTOS) 15 MG tablet  Take 1 tablet (15 mg total) by mouth daily.   30 tablet   6   .  pravastatin (PRAVACHOL) 80 MG tablet  Take 80 mg by mouth daily.         Marland Kitchen  tiotropium (SPIRIVA) 18 MCG inhalation capsule  Place 1 capsule (18 mcg total) into inhaler and inhale daily.   30 capsule   12      No current facility-administered medications for this visit.      Past Medical History   Diagnosis  Date   .  VENTRICULAR TACHYCARDIA     .  PERIPHERAL VASCULAR DISEASE     .  HYPERTENSION     .  HYPERCHOLESTEROLEMIA     .  CORONARY ARTERY DISEASE     .  Chronic systolic heart failure     .  CAROTID ARTERY DISEASE     .  SEBORRHEIC DERMATITIS     .  RENAL INSUFFICIENCY     .  DIVERTICULOSIS OF COLON     .  COPD     .  COLONIC POLYPS     .  ASTHMATIC BRONCHITIS, ACUTE     .  ANXIETY     .  ICD (implantable cardiac defibrillator) in place     .  Chronic bronchitis         "every year" (05/24/2012)   .  Type II diabetes mellitus     .  Chronic lower back pain     .  DJD (degenerative joint disease)         "hands" (05/24/2012)   .  Osteoarthritis of finger        Past Surgical History   Procedure  Laterality  Date   .  Coronary artery bypass graft    1990's       CABG X4   .  Tonsillectomy    ?1942   .  Cardiac defibrillator placement    12/2004       single chamber defibrillator- Med[employer]onic Maxima 8/06 DrKlein [Other][   .  Tee without cardioversion  N/A  12/22/2013       Procedure: TRANSESOPHAGEAL ECHOCARDIOGRAM (TEE);  Surgeon: Thayer Headings, MD; Location: [place];  Service: Cardiovascular;  Laterality: N/A;   .  Cardioversion  N/A  12/22/2013       Procedure: CARDIOVERSION;  Surgeon: Thayer Headings, MD;  Location[facility] END[facility]COPY;  Service: Cardiovascular;  Laterality: N/A;      Family History   Problem  Relation  Age of Onset   .  Heart attack  Father  59   .  Heart attack  Brother         Vague history        History      Social History   .  Marital Status:  Married       Spouse Name:  N/A       Number of Children:  N/A   .  Years of Education:  N/A      Occupational History   .  Not on file.      Social History Main Topics   .  Smoking status:  Former Smoker -- 2.00 packs/day for 50 years       Types:  Cigarettes       Quit date:  06/24/2000   .  Smokeless tobacco:  Never Used   .  Alcohol Use:  Yes         Comment: 05/24/2012 "used to drink alot; quit > 10 yr ago"   .  Drug Use:  No   .  Sexual Activity:  No      Other Topics  Concern   .  Not on file      Social History Narrative   .  No narrative on file    ROS:  Decreased appetite.  Otherwise as stated in the HPI and negative for all other systems.  PHYSICAL EXAM BP 80/40  Pulse 60  Ht 5\' 7"  (1.702 m)  Wt 172 lb (78.019 kg)  BMI 26.93 kg/m2 GEN:  No distress, chronically ill appearing.    NECK:   Jugular venous distention to the jaw at 90 degrees, waveform within normal limits, carotid upstroke brisk and symmetric, no bruits,  no thyromegaly LYMPHATICS:  No cervical adenopathy LUNGS:  Clear to auscultation bilaterally BACK:  No CVA tenderness CHEST:  Unremarkable HEART:  S1 and S2 within normal limits, no S3, no S4, no clicks, no rubs, distant apical systolic murmur ABD:  Positive bowel sounds normal in frequency in pitch, no bruits, no rebound, no guarding, unable to assess midline mass or bruit with the patient seated. EXT:  2 plus pulses throughout, severe edema, no cyanosis no clubbing SKIN:  No rashes no nodules, bruising NEURO:  Cranial nerves II through XII grossly intact, motor grossly intact throughout PSYCH:  Cognitively intact, oriented to person place and time  EKG:  Pending  ASSESSMENT AND PLAN  ACUTE ON CHRONIC SYSTOLIC HF:  I suspect his having low output heart failure. I will admit him to the hospital. We will start with conservative therapy while we await his lab results. I will hold his ACE  inhibitor as I expect his creatinine will be more elevated than his baseline. He will keep his feet elevated and wear compression stockings. I will reduce his beta blocker slightly. I will start with Lasix 40 mg twice a day. He might need inotropic agents. We will repeat an echocardiogram. Likely during this hospitalization we will have to reevaluate for cardioversion.  FLUTTER:  He will continue his Eliquis and amiodarone. We will discuss with Dr. Lovena Le plans for cardioversion.    CKD:  As above.  GI MASS:  This was identified last year it was decided he would be managed conservatively and he's had no symptoms of obstruction or bleeding. No further evaluation is indicated.  CAD:  Stress testing last year demonstrated previous infarct with a moderately reduced EF as was known previously. No new findings. No further ischemia workup is planned.  Vita Barley, MD

## 2014-01-25 ENCOUNTER — Encounter: Payer: Self-pay | Admitting: Family Medicine

## 2014-01-25 ENCOUNTER — Inpatient Hospital Stay (HOSPITAL_COMMUNITY): Payer: Medicare Other

## 2014-01-25 DIAGNOSIS — N179 Acute kidney failure, unspecified: Secondary | ICD-10-CM

## 2014-01-25 DIAGNOSIS — I509 Heart failure, unspecified: Secondary | ICD-10-CM

## 2014-01-25 DIAGNOSIS — N189 Chronic kidney disease, unspecified: Secondary | ICD-10-CM

## 2014-01-25 LAB — GLUCOSE, CAPILLARY
GLUCOSE-CAPILLARY: 112 mg/dL — AB (ref 70–99)
GLUCOSE-CAPILLARY: 185 mg/dL — AB (ref 70–99)
Glucose-Capillary: 150 mg/dL — ABNORMAL HIGH (ref 70–99)
Glucose-Capillary: 168 mg/dL — ABNORMAL HIGH (ref 70–99)

## 2014-01-25 LAB — CARBOXYHEMOGLOBIN
CARBOXYHEMOGLOBIN: 1.6 % — AB (ref 0.5–1.5)
METHEMOGLOBIN: 0.7 % (ref 0.0–1.5)
O2 Saturation: 66.8 %
Total hemoglobin: 11.3 g/dL — ABNORMAL LOW (ref 13.5–18.0)

## 2014-01-25 LAB — BASIC METABOLIC PANEL
Anion gap: 10 (ref 5–15)
BUN: 59 mg/dL — AB (ref 6–23)
CALCIUM: 8.7 mg/dL (ref 8.4–10.5)
CHLORIDE: 96 meq/L (ref 96–112)
CO2: 29 mEq/L (ref 19–32)
Creatinine, Ser: 2.69 mg/dL — ABNORMAL HIGH (ref 0.50–1.35)
GFR, EST AFRICAN AMERICAN: 25 mL/min — AB (ref >90)
GFR, EST NON AFRICAN AMERICAN: 21 mL/min — AB (ref >90)
Glucose, Bld: 113 mg/dL — ABNORMAL HIGH (ref 70–99)
Potassium: 5.1 mEq/L (ref 3.7–5.3)
Sodium: 135 mEq/L — ABNORMAL LOW (ref 137–147)

## 2014-01-25 LAB — TROPONIN I: Troponin I: <0.3 ng/mL (ref <0.30)

## 2014-01-25 MED ORDER — PNEUMOCOCCAL VAC POLYVALENT 25 MCG/0.5ML IJ INJ
0.5000 mL | INJECTION | INTRAMUSCULAR | Status: AC
  Administered 2014-01-27: 0.5 mL via INTRAMUSCULAR
  Filled 2014-01-25 (×2): qty 0.5

## 2014-01-25 MED ORDER — MAGNESIUM SULFATE 40 MG/ML IJ SOLN
2.0000 g | Freq: Once | INTRAMUSCULAR | Status: AC
  Administered 2014-01-25: 2 g via INTRAVENOUS
  Filled 2014-01-25: qty 50

## 2014-01-25 NOTE — Progress Notes (Signed)
Advanced Heart Failure Rounding Note   Subjective:    [name] is a 78 yo male with a history of HTN, CAD s/p CABG, ICM s/p ICD, chronic systolic heart failure, DM2, Vtach, atrial flutter, PVD and GI mass.   ECHO (12/22/13) EF 20-25%, mod MR, no evidence of thrombus in LA, mod TR.  Was at the beach last week and had 10 lb weight gain. On the way home yesterday had syncopal episode getting out of car. He has had increased LE edema and dyspnea with walking. Admitted for concerns of low output and central line placed.   Initial co-ox 49% and started on milrinone. He was also started on lasix 80 mg IV BID. Weight down 4 lbs, 24 hr I/O -1.8 liters. Co-ox this am 67%.    Objective:   Weight Range:  Vital Signs:   Temp:  [97.7 F (36.5 C)-98.3 F (36.8 C)] 97.7 F (36.5 C) (09/02 0747) Pulse Rate:  [59-69] 61 (09/02 0747) Resp:  [8-19] 10 (09/02 0747) BP: (80-132)/(36-85) 105/85 mmHg (09/02 0747) SpO2:  [97 %-100 %] 97 % (09/02 0747) Weight:  [167 lb 12.3 oz (76.1 kg)-172 lb (78.019 kg)] 167 lb 12.3 oz (76.1 kg) (09/02 0332)    Weight change: Filed Weights   01/24/14 2020 01/25/14 0332  Weight: 171 lb 15.3 oz (78 kg) 167 lb 12.3 oz (76.1 kg)    Intake/Output:   Intake/Output Summary (Last 24 hours) at 01/25/14 0817 Last data filed at 01/25/14 0600  Gross per 24 hour  Intake  253.4 ml  Output   2100 ml  Net -1846.6 ml     Physical Exam: General:  Chronically ill appearing. No resp difficulty HEENT: normal Neck: supple. JVP 10, RIJ 3L CL. Carotids 2+ bilat; no bruits. No lymphadenopathy or thryomegaly appreciated. Cor: PMI nondisplaced. Regular rate & rhythm. No rubs, gallops or murmurs. Lungs: clear Abdomen: soft, nontender, mildly distended. No hepatosplenomegaly. No bruits or masses. Good bowel sounds. Extremities: no cyanosis, clubbing, rash, 2-3+ bilateral edema Neuro: alert & orientedx3, cranial nerves grossly intact. moves all 4 extremities w/o difficulty.  Affect pleasant  Telemetry: NSR 60s  Labs: Basic Metabolic Panel:  Recent Labs Lab 01/24/14 1909 01/25/14 0330  NA 133* 135*  K 5.7* 5.1  CL 94* 96  CO2 27 29  GLUCOSE 113* 113*  BUN 64* 59*  CREATININE 2.82* 2.69*  CALCIUM 9.0 8.7  MG 1.8  --     Liver Function Tests:  Recent Labs Lab 01/24/14 1909  AST 67*  ALT 20  ALKPHOS 80  BILITOT 2.4*  PROT 6.4  ALBUMIN 3.1*   No results found for this basename: LIPASE, AMYLASE,  in the last 168 hours No results found for this basename: AMMONIA,  in the last 168 hours  CBC:  Recent Labs Lab 01/24/14 1909  WBC 8.4  NEUTROABS 6.4  HGB 11.9*  HCT 34.8*  MCV 87.7  PLT 242    Cardiac Enzymes:  Recent Labs Lab 01/24/14 1909 01/25/14 0330  TROPONINI <0.30 <0.30    BNP: BNP (last 3 results)  Recent Labs  08/31/13 1322 01/24/14 1909  PROBNP 230.0* 6721.0*     Other results:  EKG:   Imaging: Dg Chest Port 1 View  01/25/2014   CLINICAL DATA:  Shortness of breath.  Congestive heart failure.  EXAM: Single view of the chest 01/24/2014 and 08/31/2013.  COMPARISON:  None.  FINDINGS: Right IJ catheter remains in place. AICD is again seen. Cardiomegaly is again seen. Vascular  congestion has improved. No pneumothorax or pleural effusion is identified. Calcified pleural plaques again seen.  IMPRESSION: Improved vascular congestion.  No new abnormality.   Electronically Signed   By: Inge Rise M.D.   On: 01/25/2014 07:24   Dg Chest Port 1 View  01/24/2014   CLINICAL DATA:  Bright central line placement  EXAM: PORTABLE CHEST - 1 VIEW  COMPARISON:  08/31/2013  FINDINGS: There is a right IJ catheter with tip in the projection of the cavoatrial junction. No pneumothorax identified. Left chest wall ICD is noted with leads in the right ventricle. There is moderate cardiac enlargement and pulmonary vascular congestion. No pleural effusions identified. Calcified pleural plaques are noted overlying the left lung.   IMPRESSION: 1. Right IJ catheter tip is in the projection of the cavoatrial junction. 2. Cardiac enlargement and pulmonary vascular congestion.   Electronically Signed   By: Kerby Moors M.D.   On: 01/24/2014 19:34      Medications:     Scheduled Medications: . amiodarone  200 mg Oral BID  . apixaban  2.5 mg Oral BID  . atorvastatin  40 mg Oral QHS  . Chlorhexidine Gluconate Cloth  6 each Topical Q0600  . fenofibrate  160 mg Oral Daily  . furosemide  80 mg Intravenous BID  . insulin aspart  0-15 Units Subcutaneous TID WC  . mexiletine  150 mg Oral Q12H  . mometasone-formoterol  2 puff Inhalation BID  . mupirocin ointment  1 application Nasal BID  . [START ON 01/26/2014] pneumococcal 23 valent vaccine  0.5 mL Intramuscular Tomorrow-1000  . sodium chloride  3 mL Intravenous Q12H  . tiotropium  18 mcg Inhalation Daily     Infusions: . sodium chloride 10 mL/hr (01/24/14 2200)  . milrinone 0.25 mcg/kg/min (01/24/14 2057)     PRN Medications:  sodium chloride, acetaminophen, sodium chloride   Assessment:   1) A/C systolic HF - EF 70-01% 2) Cardiorenal syndrome 3) Atrial flutter - on apixaban 4) AKI on CKD stage IIII - baseline Cr 2.1-2.3 5) CAD - s/p CABG 6) GI Mass  Plan/Discussion:    [name] was admitted last night with low output and co-ox, 49%. He was started on milrinone 0.25 mcg and IV lasix for massive volume overload. Co-ox improved to 67%  Volume status improving but CVP remains elevated. Will continue lasix 80 mg IV BID and milrinone 0.25 mcg. Will likely need RHC once at baseline to assess hemodynamics. Repeat ECHO pending.   Renal function continues to improve and will continue to follow. Supplement Mag  Remains in NSR 60s on amiodarone and mexiletine. QTc 455 will continue to follow. QRS 164 may benefit from CRT-D, however with his functional decline may not benefit from this procedure and may need a LVAD. Will need to look at RV. Will discuss  with Dr. Haroldine Laws about increasing apixaban to 5 mg BID.  Consult CR for ambulation.   Length of Stay: 1 Rande Brunt NP-C 01/25/2014, 8:17 AM  Advanced Heart Failure Team Pager [phone] (M-F; 7a - 4p)  Please contact [facility] for night-coverage after hours (4p -7a ) and weekends on [email or web address]  Patient seen and examined with Junie Bame, NP. We discussed all aspects of the encounter. I agree with the assessment and plan as stated above.   Much improved with milrinone. Diuresing well. Renal function improved. Will continue IV milrinone and diuresis for next several days and then will need to see if we can wean milrinone or  if he will need home inotropes. CRT upgrade or VAD may also be options which we discussed briefly.   Nayquan Evinger,MD 1:43 PM

## 2014-01-25 NOTE — Plan of Care (Signed)
Problem: Phase I Progression Outcomes Goal: Up in chair, BRP Outcome: Progressing Patient OOB in recliner chair all day. Feet and legs elevated on pillows. Patient stated he was comfortable, and enjoys reading his paper. Will continue to monitor closely.

## 2014-01-25 NOTE — Progress Notes (Signed)
CARDIAC REHAB PHASE I   PRE:  Rate/Rhythm: 33 SR  BP:  Supine: 98/43  Sitting:   Standing:    SaO2: 100 2L 99 RA  MODE:  Ambulation: 350 ft   POST:  Rate/Rhythm: 86  SR  BP:  Supine:   Sitting: 121/39  Standing:    SaO2: 97 RA 1415-1505 On arrival pt in bed sleeping, aroused easily. Assisted X 1 to ambulate. Gait steady. Pt able to walk 350 feet with one standing rest stop. VS stable. Pt to recliner after walk with call light in reach. Pt states that he "can't believe that he could walk that far, it had been a long time since he has been able to." RA sat after walk 97%, O2 left off. Reported to RN.  Rodney Langton RN 01/25/2014 3:04 PM

## 2014-01-25 NOTE — Care Management Note (Addendum)
    Page 1 of 1   02/03/2014     12:07:21 PM CARE MANAGEMENT NOTE 02/03/2014  Patient:  [name], [name]   Account Number:  [id number]  Date Initiated:  01/25/2014  Documentation initiated by:  Elissa Hefty  Subjective/Objective Assessment:   adm w heart fialure     Action/Plan:   lives w wife, pcp dr Julien Nordmann   Anticipated DC Date:     Anticipated DC Plan:        [place]  CM consult  HF Clinic      Choice offered to / List presented to:          [facility] arranged  [place] - 11 Patient Refused      Status of service:   Medicare Important Message given?  YES (If response is "NO", the following Medicare IM given date fields will be blank) Date Medicare IM given:  02/02/2014 Medicare IM given by:  Elissa Hefty Date Additional Medicare IM given:  01/30/2014 Additional Medicare IM given by:  Elissa Hefty  Discharge Disposition:  HOME/SELF CARE  Per UR Regulation:  Reviewed for med. necessity/level of care/duration of stay  If discussed at [place] of Stay Meetings, dates discussed:   01/31/2014  02/02/2014    Comments:  9/11  1206 debbie Adelayde Minney rn,bsn spoke w pt and wife. pt plans to follow up in heart fialure clinic. he does not feel he needs hhc.  9/2 0843 debbie Vidya Bamford rn,bsn will moniter for dc needs as pt progresses.

## 2014-01-25 NOTE — Progress Notes (Signed)
  Echocardiogram 2D Echocardiogram has been performed.  [name] 01/25/2014, 11:09 AM

## 2014-01-25 NOTE — Plan of Care (Signed)
Problem: Phase I Progression Outcomes Goal: Up in chair, BRP Outcome: Progressing Patient resting in bed. Sitting on side of bed for meals. Central line intact in right IJ. Milrinone continues at 0.88mcg/kg/min. Patient states he feels much better today. VSS see flowsheet. Lasix IV continues and electrolytes replaced. Will continue to monitor closely.

## 2014-01-26 ENCOUNTER — Inpatient Hospital Stay (HOSPITAL_COMMUNITY): Payer: Medicare Other

## 2014-01-26 ENCOUNTER — Encounter: Payer: Medicare Other | Admitting: Internal Medicine

## 2014-01-26 LAB — URINALYSIS, ROUTINE W REFLEX MICROSCOPIC
BILIRUBIN URINE: NEGATIVE
Glucose, UA: NEGATIVE mg/dL
Hgb urine dipstick: NEGATIVE
Ketones, ur: NEGATIVE mg/dL
Leukocytes, UA: NEGATIVE
Nitrite: NEGATIVE
PH: 7.5 (ref 5.0–8.0)
Protein, ur: NEGATIVE mg/dL
Specific Gravity, Urine: 1.009 (ref 1.005–1.030)
Urobilinogen, UA: 1 mg/dL (ref 0.0–1.0)

## 2014-01-26 LAB — GLUCOSE, CAPILLARY
GLUCOSE-CAPILLARY: 183 mg/dL — AB (ref 70–99)
Glucose-Capillary: 120 mg/dL — ABNORMAL HIGH (ref 70–99)
Glucose-Capillary: 166 mg/dL — ABNORMAL HIGH (ref 70–99)
Glucose-Capillary: 169 mg/dL — ABNORMAL HIGH (ref 70–99)

## 2014-01-26 LAB — BASIC METABOLIC PANEL
ANION GAP: 10 (ref 5–15)
BUN: 53 mg/dL — ABNORMAL HIGH (ref 6–23)
CO2: 33 mEq/L — ABNORMAL HIGH (ref 19–32)
Calcium: 8.9 mg/dL (ref 8.4–10.5)
Chloride: 90 mEq/L — ABNORMAL LOW (ref 96–112)
Creatinine, Ser: 2.72 mg/dL — ABNORMAL HIGH (ref 0.50–1.35)
GFR calc Af Amer: 25 mL/min — ABNORMAL LOW (ref >90)
GFR calc non Af Amer: 21 mL/min — ABNORMAL LOW (ref >90)
Glucose, Bld: 121 mg/dL — ABNORMAL HIGH (ref 70–99)
POTASSIUM: 5 meq/L (ref 3.7–5.3)
Sodium: 133 mEq/L — ABNORMAL LOW (ref 137–147)

## 2014-01-26 LAB — CARBOXYHEMOGLOBIN
Carboxyhemoglobin: 1.8 % — ABNORMAL HIGH (ref 0.5–1.5)
Methemoglobin: 0.6 % (ref 0.0–1.5)
O2 SAT: 75.5 %
TOTAL HEMOGLOBIN: 10.5 g/dL — AB (ref 13.5–18.0)

## 2014-01-26 MED ORDER — FUROSEMIDE 40 MG PO TABS: 40.0000 mg | ORAL_TABLET | Freq: Two times a day (BID) | ORAL | Status: DC

## 2014-01-26 MED ORDER — FUROSEMIDE 10 MG/ML IJ SOLN
80.0000 mg | Freq: Two times a day (BID) | INTRAMUSCULAR | Status: AC
  Administered 2014-01-26: 80 mg via INTRAVENOUS

## 2014-01-26 MED ORDER — FUROSEMIDE 40 MG PO TABS
40.0000 mg | ORAL_TABLET | Freq: Two times a day (BID) | ORAL | Status: DC
  Filled 2014-01-26 (×2): qty 1

## 2014-01-26 NOTE — Progress Notes (Addendum)
Advanced Heart Failure Rounding Note   Subjective:    [name] is a 78 yo male with a history of HTN, CAD s/p CABG, ICM s/p ICD, chronic systolic heart failure, DM2, Vtach, atrial flutter, PVD and GI mass.   ECHO (12/22/13) EF 20-25%, mod MR, no evidence of thrombus in LA, mod TR.  Admitted 9/1 with low output HF and worsening renal failure.   Initial co-ox 49% and started on milrinone. He was also started on lasix 80 mg IV BID. Weight down 7 lbs, 24 hr I/O -3.2 liters. Co-ox this 75%. Renal function trending up. CVP 6  Repeat ECHO: EF 40-45%, RV nl (Dr. Haroldine Laws reviewed) TAPSE 1.34   Objective:   Weight Range:  Vital Signs:   Temp:  [96.8 F (36 C)-98.7 F (37.1 C)] 98.7 F (37.1 C) (09/03 0300) Pulse Rate:  [57-84] 84 (09/03 0300) Resp:  [9-24] 17 (09/03 0300) BP: (89-122)/(31-85) 106/41 mmHg (09/03 0300) SpO2:  [78 %-100 %] 90 % (09/03 0300) Weight:  [160 lb 11.5 oz (72.9 kg)] 160 lb 11.5 oz (72.9 kg) (09/03 0300) Last BM Date: 01/25/14  Weight change: Filed Weights   01/24/14 2020 01/25/14 0332 01/26/14 0300  Weight: 171 lb 15.3 oz (78 kg) 167 lb 12.3 oz (76.1 kg) 160 lb 11.5 oz (72.9 kg)    Intake/Output:   Intake/Output Summary (Last 24 hours) at 01/26/14 0743 Last data filed at 01/26/14 0600  Gross per 24 hour  Intake  925.7 ml  Output   4200 ml  Net -3274.3 ml     Physical Exam: General:  Chronically ill appearing. No resp difficulty HEENT: normal Neck: supple. JVP flat, RIJ 3L CL. Carotids 2+ bilat; no bruits. No lymphadenopathy or thryomegaly appreciated. Cor: PMI nondisplaced. Regular rate & rhythm. No rubs, gallops or murmurs. Lungs: clear Abdomen: soft, nontender, mildly distended. No hepatosplenomegaly. No bruits or masses. Good bowel sounds. Extremities: no cyanosis, clubbing, rash, 1+ bilateral edema Neuro: alert & orientedx3, cranial nerves grossly intact. moves all 4 extremities w/o difficulty. Affect pleasant  Telemetry: NSR  60s  Labs: Basic Metabolic Panel:  Recent Labs Lab 01/24/14 1909 01/25/14 0330 01/26/14 0340  NA 133* 135* 133*  K 5.7* 5.1 5.0  CL 94* 96 90*  CO2 27 29 33*  GLUCOSE 113* 113* 121*  BUN 64* 59* 53*  CREATININE 2.82* 2.69* 2.72*  CALCIUM 9.0 8.7 8.9  MG 1.8  --   --     Liver Function Tests:  Recent Labs Lab 01/24/14 1909  AST 67*  ALT 20  ALKPHOS 80  BILITOT 2.4*  PROT 6.4  ALBUMIN 3.1*   No results found for this basename: LIPASE, AMYLASE,  in the last 168 hours No results found for this basename: AMMONIA,  in the last 168 hours  CBC:  Recent Labs Lab 01/24/14 1909  WBC 8.4  NEUTROABS 6.4  HGB 11.9*  HCT 34.8*  MCV 87.7  PLT 242    Cardiac Enzymes:  Recent Labs Lab 01/24/14 1909 01/25/14 0330 01/25/14 0920  TROPONINI <0.30 <0.30 <0.30    BNP: BNP (last 3 results)  Recent Labs  08/31/13 1322 01/24/14 1909  PROBNP 230.0* 6721.0*     Other results:  EKG:   Imaging: Dg Chest Port 1 View  01/25/2014   CLINICAL DATA:  Shortness of breath.  Congestive heart failure.  EXAM: Single view of the chest 01/24/2014 and 08/31/2013.  COMPARISON:  None.  FINDINGS: Right IJ catheter remains in place. AICD is again seen.  Cardiomegaly is again seen. Vascular congestion has improved. No pneumothorax or pleural effusion is identified. Calcified pleural plaques again seen.  IMPRESSION: Improved vascular congestion.  No new abnormality.   Electronically Signed   By: Inge Rise M.D.   On: 01/25/2014 07:24   Dg Chest Port 1 View  01/24/2014   CLINICAL DATA:  Bright central line placement  EXAM: PORTABLE CHEST - 1 VIEW  COMPARISON:  08/31/2013  FINDINGS: There is a right IJ catheter with tip in the projection of the cavoatrial junction. No pneumothorax identified. Left chest wall ICD is noted with leads in the right ventricle. There is moderate cardiac enlargement and pulmonary vascular congestion. No pleural effusions identified. Calcified pleural plaques  are noted overlying the left lung.  IMPRESSION: 1. Right IJ catheter tip is in the projection of the cavoatrial junction. 2. Cardiac enlargement and pulmonary vascular congestion.   Electronically Signed   By: Kerby Moors M.D.   On: 01/24/2014 19:34     Medications:     Scheduled Medications: . amiodarone  200 mg Oral BID  . apixaban  2.5 mg Oral BID  . atorvastatin  40 mg Oral QHS  . Chlorhexidine Gluconate Cloth  6 each Topical Q0600  . fenofibrate  160 mg Oral Daily  . furosemide  80 mg Intravenous BID  . insulin aspart  0-15 Units Subcutaneous TID WC  . mexiletine  150 mg Oral Q12H  . mometasone-formoterol  2 puff Inhalation BID  . mupirocin ointment  1 application Nasal BID  . pneumococcal 23 valent vaccine  0.5 mL Intramuscular Tomorrow-1000  . sodium chloride  3 mL Intravenous Q12H  . tiotropium  18 mcg Inhalation Daily    Infusions: . sodium chloride 10 mL/hr at 01/25/14 1900  . milrinone 0.25 mcg/kg/min (01/26/14 0254)    PRN Medications: sodium chloride, acetaminophen, sodium chloride   Assessment:   1) A/C systolic HF - EF 25-42% 2) Cardiorenal syndrome 3) Atrial flutter - on apixaban 4) AKI on CKD stage IIII - baseline Cr 2.1-2.3 5) CAD - s/p CABG 6) GI Mass  Plan/Discussion:    [name] was admitted last night with low output and co-ox, 49%. He was started on milrinone 0.25 mcg and IV lasix for massive volume overload. Co-ox improved to 67%  Volume status improving down 7 lbs overnight and CVP 6. Will stop IV diuretics and switch to po lasix 40 mg BID starting tomorrow.  Co-ox 75% will continue milrinone. Renal function trending up. Will get renal ultrasound. Will schedule for RHC tomorrow to assess pressures and output. SBP 80s however not symptomatic may need to switch to dobutamine but hesitant with history of atrial flutter.    Remains in NSRon amiodarone and mexiletine. QTc 455 will continue to follow. QRS 164 may benefit from CRT-D,  however with his functional decline may not benefit from this procedure and may need a LVAD. Will need to look at RV.   CR for ambulation.   Length of Stay: 2 Rande Brunt NP-C 01/26/2014, 7:43 AM  Advanced Heart Failure Team Pager [phone] (M-F; 7a - 4p)  Please contact [facility] for night-coverage after hours (4p -7a ) and weekends on [email or web address]  Patient seen and examined with Junie Bame, NP. We discussed all aspects of the encounter. I agree with the assessment and plan as stated above.   Co-ox and symptoms much improved with milrinone but renal recovery lagging (?ATN). I have reviewed echo and EF 40% with mild RV dysfunction.  TAPSE low at 1.36. Diuresing well. Volume status now looks good. Would continue inotrope support and diuretics to po. If renal function does not turn around soon will consider RHC.   Get renal u/s and UA.   I suspect he will need home inotropes and possibly consideration of advanced therapies.   Victor Granados,MD 9:29 AM

## 2014-01-26 NOTE — Progress Notes (Signed)
Pharmacist Heart Failure Core Measure Documentation  Assessment: [name] has an EF documented as 40-45% on 01/25/14 by Echo.  Rationale: Heart failure patients with left ventricular systolic dysfunction (LVSD) and an EF < 40% should be prescribed an angiotensin converting enzyme inhibitor (ACEI) or angiotensin receptor blocker (ARB) at discharge unless a contraindication is documented in the medical record.  This patient is not currently on an ACEI or ARB for HF.  This note is being placed in the record in order to provide documentation that a contraindication to the use of these agents is present for this encounter.  ACE Inhibitor or Angiotensin Receptor Blocker is contraindicated (specify all that apply)  []   ACEI allergy AND ARB allergy []   Angioedema []   Moderate or severe aortic stenosis []   Hyperkalemia []   Hypotension []   Renal artery stenosis [x]   Worsening renal function, preexisting renal disease or dysfunction   Hildred Laser, Pharm D 01/26/2014 1:09 PM

## 2014-01-27 ENCOUNTER — Ambulatory Visit: Payer: Medicare Other | Admitting: Nurse Practitioner

## 2014-01-27 DIAGNOSIS — I130 Hypertensive heart and chronic kidney disease with heart failure and stage 1 through stage 4 chronic kidney disease, or unspecified chronic kidney disease: Secondary | ICD-10-CM | POA: Diagnosis not present

## 2014-01-27 DIAGNOSIS — I4892 Unspecified atrial flutter: Secondary | ICD-10-CM

## 2014-01-27 DIAGNOSIS — I509 Heart failure, unspecified: Secondary | ICD-10-CM | POA: Diagnosis not present

## 2014-01-27 LAB — CBC
HCT: 32.3 % — ABNORMAL LOW (ref 39.0–52.0)
HEMOGLOBIN: 11.2 g/dL — AB (ref 13.0–17.0)
MCH: 29.5 pg (ref 26.0–34.0)
MCHC: 34.7 g/dL (ref 30.0–36.0)
MCV: 85 fL (ref 78.0–100.0)
PLATELETS: 239 10*3/uL (ref 150–400)
RBC: 3.8 MIL/uL — AB (ref 4.22–5.81)
RDW: 17.2 % — ABNORMAL HIGH (ref 11.5–15.5)
WBC: 9.2 10*3/uL (ref 4.0–10.5)

## 2014-01-27 LAB — BASIC METABOLIC PANEL
ANION GAP: 13 (ref 5–15)
BUN: 44 mg/dL — ABNORMAL HIGH (ref 6–23)
CALCIUM: 8.8 mg/dL (ref 8.4–10.5)
CO2: 32 meq/L (ref 19–32)
Chloride: 86 mEq/L — ABNORMAL LOW (ref 96–112)
Creatinine, Ser: 2.42 mg/dL — ABNORMAL HIGH (ref 0.50–1.35)
GFR calc Af Amer: 28 mL/min — ABNORMAL LOW (ref >90)
GFR calc non Af Amer: 24 mL/min — ABNORMAL LOW (ref >90)
Glucose, Bld: 167 mg/dL — ABNORMAL HIGH (ref 70–99)
Potassium: 4 mEq/L (ref 3.7–5.3)
SODIUM: 131 meq/L — AB (ref 137–147)

## 2014-01-27 LAB — CARBOXYHEMOGLOBIN
CARBOXYHEMOGLOBIN: 1.8 % — AB (ref 0.5–1.5)
METHEMOGLOBIN: 0.7 % (ref 0.0–1.5)
O2 SAT: 74.6 %
Total hemoglobin: 11.4 g/dL — ABNORMAL LOW (ref 13.5–18.0)

## 2014-01-27 LAB — GLUCOSE, CAPILLARY
GLUCOSE-CAPILLARY: 156 mg/dL — AB (ref 70–99)
Glucose-Capillary: 187 mg/dL — ABNORMAL HIGH (ref 70–99)

## 2014-01-27 MED ORDER — ONDANSETRON HCL 4 MG/2ML IJ SOLN: 4.0000 mg | Freq: Four times a day (QID) | INTRAMUSCULAR | Status: DC

## 2014-01-27 MED ORDER — ONDANSETRON HCL 4 MG/2ML IJ SOLN
INTRAMUSCULAR | Status: AC
  Filled 2014-01-27: qty 2

## 2014-01-27 MED ORDER — ONDANSETRON HCL 4 MG PO TABS: 4.0000 mg | ORAL_TABLET | Freq: Four times a day (QID) | ORAL | Status: DC | PRN

## 2014-01-27 MED ORDER — FUROSEMIDE 40 MG PO TABS
40.0000 mg | ORAL_TABLET | Freq: Every day | ORAL | Status: DC
  Administered 2014-01-28 – 2014-02-03 (×7): 40 mg via ORAL
  Filled 2014-01-27 (×7): qty 1

## 2014-01-27 MED ORDER — MILRINONE IN DEXTROSE 20 MG/100ML IV SOLN
0.1250 ug/kg/min | INTRAVENOUS | Status: DC
  Administered 2014-01-27 – 2014-01-30 (×5): 0.25 ug/kg/min via INTRAVENOUS
  Administered 2014-01-31: 0.125 ug/kg/min via INTRAVENOUS
  Administered 2014-01-31: 0.25 ug/kg/min via INTRAVENOUS
  Administered 2014-02-01: 0.125 ug/kg/min via INTRAVENOUS
  Filled 2014-01-27 (×7): qty 100

## 2014-01-27 MED ORDER — ONDANSETRON HCL 4 MG/2ML IJ SOLN
4.0000 mg | Freq: Four times a day (QID) | INTRAMUSCULAR | Status: DC | PRN
  Administered 2014-01-27 – 2014-01-29 (×2): 4 mg via INTRAVENOUS
  Filled 2014-01-27: qty 2

## 2014-01-27 MED ORDER — MILRINONE IN DEXTROSE 20 MG/100ML IV SOLN: 0.1250 ug/kg/min | INTRAVENOUS | Status: DC

## 2014-01-27 NOTE — Progress Notes (Signed)
Advanced Heart Failure Rounding Note   Subjective:    [name] is a 78 yo male with a history of HTN, CAD s/p CABG, ICM s/p ICD, chronic systolic heart failure, DM2, Vtach, atrial flutter, PVD and GI mass.   ECHO (12/22/13) EF 20-25%, mod MR, no evidence of thrombus in LA, mod TR.  Admitted 9/1 with low output HF and worsening renal failure. ECHO: EF 40-45%, RV mild dysfucntion TAPSE 1.34  Initial co-ox 49% and started on milrinone. Transitioned to PO diuretics yesterday. Weight down 5 lbs and renal function improving. Remains on IV milrinone and co-ox 75%. UA nl. Renal US R kidney 9.9 cm and L kidney 10.6 cm. Denies SOB, orthopnea or CP. Having some nausea and dry heaves. CVP 5   Objective:   Weight Range:  Vital Signs:   Temp:  [97.9 F (36.6 C)-98.7 F (37.1 C)] 98.2 F (36.8 C) (09/04 0400) Pulse Rate:  [74-81] 77 (09/04 0400) Resp:  [10-24] 16 (09/04 0400) BP: (97-126)/(37-79) 97/49 mmHg (09/04 0400) SpO2:  [92 %-98 %] 98 % (09/04 0400) Weight:  [155 lb 8 oz (70.534 kg)] 155 lb 8 oz (70.534 kg) (09/04 0500) Last BM Date: 01/25/14  Weight change: Filed Weights   01/25/14 0332 01/26/14 0300 01/27/14 0500  Weight: 167 lb 12.3 oz (76.1 kg) 160 lb 11.5 oz (72.9 kg) 155 lb 8 oz (70.534 kg)    Intake/Output:   Intake/Output Summary (Last 24 hours) at 01/27/14 0726 Last data filed at 01/27/14 0600  Gross per 24 hour  Intake  365.7 ml  Output   3000 ml  Net -2634.3 ml     Physical Exam: General:  Chronically ill appearing. No resp difficulty HEENT: normal Neck: supple. JVP flat, RIJ 3L CL. Carotids 2+ bilat; no bruits. No lymphadenopathy or thryomegaly appreciated. Cor: PMI nondisplaced. Regular rate & rhythm. No rubs, gallops or murmurs. Lungs: clear Abdomen: soft, nontender, mildly distended. No hepatosplenomegaly. No bruits or masses. Good bowel sounds. Extremities: no cyanosis, clubbing, rash, no edema Neuro: alert & orientedx3, cranial nerves grossly intact.  moves all 4 extremities w/o difficulty. Affect pleasant  Telemetry: NSR 60s  Labs: Basic Metabolic Panel:  Recent Labs Lab 01/24/14 1909 01/25/14 0330 01/26/14 0340 01/27/14 0415  NA 133* 135* 133* 131*  K 5.7* 5.1 5.0 4.0  CL 94* 96 90* 86*  CO2 27 29 33* 32  GLUCOSE 113* 113* 121* 167*  BUN 64* 59* 53* 44*  CREATININE 2.82* 2.69* 2.72* 2.42*  CALCIUM 9.0 8.7 8.9 8.8  MG 1.8  --   --   --     Liver Function Tests:  Recent Labs Lab 01/24/14 1909  AST 67*  ALT 20  ALKPHOS 80  BILITOT 2.4*  PROT 6.4  ALBUMIN 3.1*   No results found for this basename: LIPASE, AMYLASE,  in the last 168 hours No results found for this basename: AMMONIA,  in the last 168 hours  CBC:  Recent Labs Lab 01/24/14 1909 01/27/14 0415  WBC 8.4 9.2  NEUTROABS 6.4  --   HGB 11.9* 11.2*  HCT 34.8* 32.3*  MCV 87.7 85.0  PLT 242 239    Cardiac Enzymes:  Recent Labs Lab 01/24/14 1909 01/25/14 0330 01/25/14 0920  TROPONINI <0.30 <0.30 <0.30    BNP: BNP (last 3 results)  Recent Labs  08/31/13 1322 01/24/14 1909  PROBNP 230.0* 6721.0*     Other results:  EKG:   Imaging: US Renal  01/26/2014   CLINICAL DATA:  Assess  renal sizes, elevated BUN and creatinine, renal insufficiency, history hypertension, coronary artery disease, diabetes  EXAM: RENAL/URINARY TRACT ULTRASOUND COMPLETE  COMPARISON:  CT virtual colonoscopy 12/07/2012  FINDINGS: Right Kidney:  Length: 9.9 cm. Mild cortical thinning. Normal cortical echogenicity. No mass, hydronephrosis or shadowing calcification.  Left Kidney:  Length: 10.6 cm. Normal cortical echogenicity. Less cortical thinning then on RIGHT. No mass, hydronephrosis or shadowing calcification.  Bladder:  Normal appearance  IMPRESSION: No evidence of renal mass or obstruction.   Electronically Signed   By: Lavonia Dana M.D.   On: 01/26/2014 12:55     Medications:     Scheduled Medications: . amiodarone  200 mg Oral BID  . apixaban  2.5 mg Oral  BID  . atorvastatin  40 mg Oral QHS  . Chlorhexidine Gluconate Cloth  6 each Topical Q0600  . furosemide  40 mg Oral BID  . insulin aspart  0-15 Units Subcutaneous TID WC  . mexiletine  150 mg Oral Q12H  . mometasone-formoterol  2 puff Inhalation BID  . mupirocin ointment  1 application Nasal BID  . pneumococcal 23 valent vaccine  0.5 mL Intramuscular Tomorrow-1000  . sodium chloride  3 mL Intravenous Q12H  . tiotropium  18 mcg Inhalation Daily    Infusions: . sodium chloride 10 mL/hr at 01/26/14 1900  . milrinone 0.25 mcg/kg/min (01/26/14 1900)    PRN Medications: sodium chloride, acetaminophen, sodium chloride   Assessment:   1) A/C systolic HF - EF 20-23% 2) Cardiorenal syndrome 3) Atrial flutter - on apixaban 4) AKI on CKD stage IIII - baseline Cr 2.1-2.3 5) CAD - s/p CABG 6) GI Mass 7) Cardiogenic shock.   Plan/Discussion:    [name] was admitted 9/1 with low output and co-ox, 49%. He was started on milrinone 0.25 mcg and IV lasix for massive volume overload.   Volume status good, weight down another 5 lbs. CVP 5 will start lasix 40 mg PO tomorrow.  Renal function improving. Co-ox 75% this morning.   SBP 80-90s and asymptomatic. He is not on ACE-I/Spiro with CKD and no BB with cardiogenic shock.   Remains in NSR on amiodarone and mexiletine. QTc 455 will continue to follow. QRS 164 may benefit from CRT-D, however with his functional decline may not benefit from this procedure and may need a LVAD.   CR for ambulation.   Length of Stay: 3 Rande Brunt NP-C 01/27/2014, 7:26 AM  Advanced Heart Failure Team Pager [phone] (M-F; 7a - 4p)  Please contact [facility] for night-coverage after hours (4p -7a ) and weekends on [email or web address]  Patient seen and examined with Junie Bame, NP. We discussed all aspects of the encounter. I agree with the assessment and plan as stated above.   Continues to improve. Co-ox good on milrinone. CVP 5. Creatinine starting  to get better. Would continue milrinone at current dose throughout the weekend and see how good his renal function will get. Once back to baseline can attempt milrinone wean with low threshold for home inotropes/advanced therapies which I think he will need. Start lasix 40 tomorrow.   Daniel Bensimhon,MD 9:51 AM

## 2014-01-27 NOTE — Progress Notes (Signed)
CARDIAC REHAB PHASE I   PRE:  Rate/Rhythm: 78 SR  BP:  Supine: 138/52  Sitting:   Standing:    SaO2: 92%RA  MODE:  Ambulation: 350 ft   POST:  Rate/Rhythm: 112 ST  BP:  Supine:   Sitting: 143/57  Standing:    SaO2: 97%RA 1458-1523 Pt walked 350 ft on RA with rolling walker and asst x 1 with steady gait.  Tolerated well. To bed after walk. Did not need to stop to catch his breath. Pt did not want to try to go farther.   [name], RN BSN  01/27/2014 3:20 PM

## 2014-01-28 LAB — GLUCOSE, CAPILLARY
GLUCOSE-CAPILLARY: 155 mg/dL — AB (ref 70–99)
GLUCOSE-CAPILLARY: 187 mg/dL — AB (ref 70–99)
Glucose-Capillary: 208 mg/dL — ABNORMAL HIGH (ref 70–99)
Glucose-Capillary: 177 mg/dL — ABNORMAL HIGH (ref 70–99)
Glucose-Capillary: 149 mg/dL — ABNORMAL HIGH (ref 70–99)
Glucose-Capillary: 193 mg/dL — ABNORMAL HIGH (ref 70–99)

## 2014-01-28 LAB — BASIC METABOLIC PANEL
Anion gap: 10 (ref 5–15)
BUN: 31 mg/dL — AB (ref 6–23)
CALCIUM: 8.8 mg/dL (ref 8.4–10.5)
CO2: 31 meq/L (ref 19–32)
Chloride: 92 mEq/L — ABNORMAL LOW (ref 96–112)
Creatinine, Ser: 1.84 mg/dL — ABNORMAL HIGH (ref 0.50–1.35)
GFR calc Af Amer: 39 mL/min — ABNORMAL LOW (ref >90)
GFR, EST NON AFRICAN AMERICAN: 34 mL/min — AB (ref >90)
GLUCOSE: 149 mg/dL — AB (ref 70–99)
Potassium: 4.5 mEq/L (ref 3.7–5.3)
SODIUM: 133 meq/L — AB (ref 137–147)

## 2014-01-28 LAB — CARBOXYHEMOGLOBIN
Carboxyhemoglobin: 1.7 % — ABNORMAL HIGH (ref 0.5–1.5)
Methemoglobin: 0.5 % (ref 0.0–1.5)
O2 SAT: 68.7 %
TOTAL HEMOGLOBIN: 15 g/dL (ref 13.5–18.0)

## 2014-01-28 MED ORDER — ISOSORBIDE MONONITRATE 15 MG HALF TABLET
15.0000 mg | ORAL_TABLET | Freq: Every day | ORAL | Status: DC
  Administered 2014-01-28 – 2014-01-29 (×2): 15 mg via ORAL
  Filled 2014-01-28 (×2): qty 1

## 2014-01-28 MED ORDER — HYDRALAZINE HCL 25 MG PO TABS
12.5000 mg | ORAL_TABLET | Freq: Three times a day (TID) | ORAL | Status: DC
  Administered 2014-01-28 – 2014-01-29 (×3): 12.5 mg via ORAL
  Filled 2014-01-28 (×6): qty 0.5

## 2014-01-28 NOTE — Progress Notes (Signed)
Advanced Heart Failure Rounding Note   Subjective:    [name] is a 78 yo male with a history of HTN, CAD s/p CABG, ICM s/p ICD, chronic systolic heart failure, DM2, Vtach, atrial flutter, PVD and GI mass.   ECHO (12/22/13) EF 20-25%, mod MR, no evidence of thrombus in LA, mod TR.  Admitted 9/1 with low output HF and worsening renal failure. ECHO: EF 40-45%, RV mild dysfucntion TAPSE 1.34 Initial co-ox 49% and started on milrinone. Transitioned to PO diuretics yesterday.   Renal function continues to improve on IV milrinone and co-ox 69%.Denies SOB, orthopnea or CP. Having some nausea and dry heaves. Weight stable. CVP 3-4   Objective:   Weight Range:  Vital Signs:   Temp:  [97.8 F (36.6 C)-98.9 F (37.2 C)] 98 F (36.7 C) (09/05 0800) Pulse Rate:  [75-82] 75 (09/05 0317) Resp:  [12-28] 15 (09/05 0800) BP: (101-125)/(35-47) 108/47 mmHg (09/05 0800) SpO2:  [94 %-98 %] 95 % (09/05 0839) Weight:  [70.7 kg (155 lb 13.8 oz)] 70.7 kg (155 lb 13.8 oz) (09/05 0317) Last BM Date: 01/25/14  Weight change: Filed Weights   01/26/14 0300 01/27/14 0500 01/28/14 0317  Weight: 72.9 kg (160 lb 11.5 oz) 70.534 kg (155 lb 8 oz) 70.7 kg (155 lb 13.8 oz)    Intake/Output:   Intake/Output Summary (Last 24 hours) at 01/28/14 1037 Last data filed at 01/28/14 1000  Gross per 24 hour  Intake  857.2 ml  Output   2350 ml  Net -1492.8 ml     Physical Exam: General:  Sitting in chair No resp difficulty HEENT: normal Neck: supple. JVP flat, RIJ 3L CL. Carotids 2+ bilat; no bruits. No lymphadenopathy or thryomegaly appreciated. Cor: PMI nondisplaced. Regular rate & rhythm. No rubs, gallops or murmurs. Lungs: clear Abdomen: soft, nontender, mildly distended. No hepatosplenomegaly. No bruits or masses. Good bowel sounds. Extremities: no cyanosis, clubbing, rash, no edema Neuro: alert & orientedx3, cranial nerves grossly intact. moves all 4 extremities w/o difficulty. Affect  pleasant  Telemetry: NSR 60s  Labs: Basic Metabolic Panel:  Recent Labs Lab 01/24/14 1909 01/25/14 0330 01/26/14 0340 01/27/14 0415 01/28/14 0453  NA 133* 135* 133* 131* 133*  K 5.7* 5.1 5.0 4.0 4.5  CL 94* 96 90* 86* 92*  CO2 27 29 33* 32 31  GLUCOSE 113* 113* 121* 167* 149*  BUN 64* 59* 53* 44* 31*  CREATININE 2.82* 2.69* 2.72* 2.42* 1.84*  CALCIUM 9.0 8.7 8.9 8.8 8.8  MG 1.8  --   --   --   --     Liver Function Tests:  Recent Labs Lab 01/24/14 1909  AST 67*  ALT 20  ALKPHOS 80  BILITOT 2.4*  PROT 6.4  ALBUMIN 3.1*   No results found for this basename: LIPASE, AMYLASE,  in the last 168 hours No results found for this basename: AMMONIA,  in the last 168 hours  CBC:  Recent Labs Lab 01/24/14 1909 01/27/14 0415  WBC 8.4 9.2  NEUTROABS 6.4  --   HGB 11.9* 11.2*  HCT 34.8* 32.3*  MCV 87.7 85.0  PLT 242 239    Cardiac Enzymes:  Recent Labs Lab 01/24/14 1909 01/25/14 0330 01/25/14 0920  TROPONINI <0.30 <0.30 <0.30    BNP: BNP (last 3 results)  Recent Labs  08/31/13 1322 01/24/14 1909  PROBNP 230.0* 6721.0*     Other results:  EKG:   Imaging: US Renal  01/26/2014   CLINICAL DATA:  Assess renal sizes,  elevated BUN and creatinine, renal insufficiency, history hypertension, coronary artery disease, diabetes  EXAM: RENAL/URINARY TRACT ULTRASOUND COMPLETE  COMPARISON:  CT virtual colonoscopy 12/07/2012  FINDINGS: Right Kidney:  Length: 9.9 cm. Mild cortical thinning. Normal cortical echogenicity. No mass, hydronephrosis or shadowing calcification.  Left Kidney:  Length: 10.6 cm. Normal cortical echogenicity. Less cortical thinning then on RIGHT. No mass, hydronephrosis or shadowing calcification.  Bladder:  Normal appearance  IMPRESSION: No evidence of renal mass or obstruction.   Electronically Signed   By: Lavonia Dana M.D.   On: 01/26/2014 12:55     Medications:     Scheduled Medications: . amiodarone  200 mg Oral BID  . apixaban  2.5  mg Oral BID  . atorvastatin  40 mg Oral QHS  . Chlorhexidine Gluconate Cloth  6 each Topical Q0600  . furosemide  40 mg Oral Daily  . insulin aspart  0-15 Units Subcutaneous TID WC  . mexiletine  150 mg Oral Q12H  . mometasone-formoterol  2 puff Inhalation BID  . mupirocin ointment  1 application Nasal BID  . sodium chloride  3 mL Intravenous Q12H  . tiotropium  18 mcg Inhalation Daily    Infusions: . sodium chloride 10 mL/hr (01/27/14 1948)  . milrinone 0.25 mcg/kg/min (01/28/14 0435)    PRN Medications: sodium chloride, acetaminophen, ondansetron (ZOFRAN) IV, sodium chloride   Assessment:   1) A/C systolic HF - EF 92-33% 2) Cardiorenal syndrome 3) Atrial flutter - on apixaban 4) AKI on CKD stage IIII - baseline Cr 2.1-2.3 5) CAD - s/p CABG 6) GI Mass 7) Cardiogenic shock.   Plan/Discussion:    [name] was admitted 9/1 with low output and co-ox, 49%. He was started on milrinone 0.25 mcg and IV lasix for massive volume overload.   Continues to improve. Co-ox good on milrinone. CVP 3-4  Creatinine continues to get better - almost near baseline. Would continue milrinone at current dose throughout the weekend and see how good his renal function will get. Once back to baseline can attempt milrinone wean with low threshold for home inotropes/advanced therapies which I think he will need. Can add low-dose Hydral/NTG as tolerated by BP. No b-blocker or ACE due to shock and renal failure.  Jerrye Seebeck,MD 10:37 AM

## 2014-01-29 LAB — BASIC METABOLIC PANEL WITH GFR
Anion gap: 10 (ref 5–15)
BUN: 27 mg/dL — ABNORMAL HIGH (ref 6–23)
CO2: 28 meq/L (ref 19–32)
Calcium: 8.2 mg/dL — ABNORMAL LOW (ref 8.4–10.5)
Chloride: 94 meq/L — ABNORMAL LOW (ref 96–112)
Creatinine, Ser: 1.66 mg/dL — ABNORMAL HIGH (ref 0.50–1.35)
GFR calc Af Amer: 45 mL/min — ABNORMAL LOW
GFR calc non Af Amer: 38 mL/min — ABNORMAL LOW
Glucose, Bld: 167 mg/dL — ABNORMAL HIGH (ref 70–99)
Potassium: 3.8 meq/L (ref 3.7–5.3)
Sodium: 132 meq/L — ABNORMAL LOW (ref 137–147)

## 2014-01-29 LAB — GLUCOSE, CAPILLARY
GLUCOSE-CAPILLARY: 185 mg/dL — AB (ref 70–99)
GLUCOSE-CAPILLARY: 169 mg/dL — AB (ref 70–99)
Glucose-Capillary: 160 mg/dL — ABNORMAL HIGH (ref 70–99)
Glucose-Capillary: 134 mg/dL — ABNORMAL HIGH (ref 70–99)

## 2014-01-29 LAB — CARBOXYHEMOGLOBIN
Carboxyhemoglobin: 1.8 % — ABNORMAL HIGH (ref 0.5–1.5)
Methemoglobin: 0.5 % (ref 0.0–1.5)
O2 Saturation: 64.6 %
Total hemoglobin: 11.1 g/dL — ABNORMAL LOW (ref 13.5–18.0)

## 2014-01-29 MED ORDER — ISOSORBIDE MONONITRATE ER 30 MG PO TB24
30.0000 mg | ORAL_TABLET | Freq: Every day | ORAL | Status: DC
  Administered 2014-01-30 – 2014-02-03 (×5): 30 mg via ORAL
  Filled 2014-01-29 (×5): qty 1

## 2014-01-29 MED ORDER — HYDRALAZINE HCL 25 MG PO TABS
25.0000 mg | ORAL_TABLET | Freq: Three times a day (TID) | ORAL | Status: DC
Start: 2014-01-29 — End: 2014-01-31
  Administered 2014-01-29 – 2014-01-31 (×6): 25 mg via ORAL
  Filled 2014-01-29 (×9): qty 1

## 2014-01-29 NOTE — Progress Notes (Signed)
Advanced Heart Failure Rounding Note   Subjective:    [name] is a 78 yo male with a history of HTN, CAD s/p CABG, ICM s/p ICD, chronic systolic heart failure, DM2, Vtach, atrial flutter, PVD and GI mass.   ECHO (12/22/13) EF 20-25%, mod MR, no evidence of thrombus in LA, mod TR.  Admitted 9/1 with low output HF and worsening renal failure. ECHO: EF 40-45%, RV mild dysfucntion TAPSE 1.34 Initial co-ox 49% and started on milrinone. Transitioned to PO diuretics yesterday.   Renal function continues to improve on IV milrinone and co-ox 65%.Denies SOB, orthopnea or CP. Having some nausea and dry heaves. Weight stable. CVP 5-6   Objective:   Weight Range:  Vital Signs:   Temp:  [97.2 F (36.2 C)-98.5 F (36.9 C)] 98.2 F (36.8 C) (09/06 0743) Pulse Rate:  [80] 80 (09/06 0345) Resp:  [12-22] 17 (09/06 0743) BP: (108-134)/(45-73) 132/57 mmHg (09/06 0743) SpO2:  [94 %-97 %] 95 % (09/06 0743) Weight:  [69.6 kg (153 lb 7 oz)] 69.6 kg (153 lb 7 oz) (09/06 0300) Last BM Date: 01/25/14  Weight change: Filed Weights   01/27/14 0500 01/28/14 0317 01/29/14 0300  Weight: 70.534 kg (155 lb 8 oz) 70.7 kg (155 lb 13.8 oz) 69.6 kg (153 lb 7 oz)    Intake/Output:   Intake/Output Summary (Last 24 hours) at 01/29/14 1058 Last data filed at 01/29/14 0900  Gross per 24 hour  Intake 1131.9 ml  Output   2375 ml  Net -1243.1 ml     Physical Exam: General:  Lying in bed No resp difficulty HEENT: normal Neck: supple. JVP 5, RIJ 3L CL. Carotids 2+ bilat; no bruits. No lymphadenopathy or thryomegaly appreciated. Cor: PMI nondisplaced. Regular rate & rhythm. No rubs, gallops or murmurs. Lungs: clear Abdomen: soft, nontender, mildly distended. No hepatosplenomegaly. No bruits or masses. Good bowel sounds. Extremities: no cyanosis, clubbing, rash, no edema Neuro: alert & orientedx3, cranial nerves grossly intact. moves all 4 extremities w/o difficulty. Affect pleasant  Telemetry: NSR  60s  Labs: Basic Metabolic Panel:  Recent Labs Lab 01/24/14 1909 01/25/14 0330 01/26/14 0340 01/27/14 0415 01/28/14 0453 01/29/14 0400  NA 133* 135* 133* 131* 133* 132*  K 5.7* 5.1 5.0 4.0 4.5 3.8  CL 94* 96 90* 86* 92* 94*  CO2 27 29 33* 32 31 28  GLUCOSE 113* 113* 121* 167* 149* 167*  BUN 64* 59* 53* 44* 31* 27*  CREATININE 2.82* 2.69* 2.72* 2.42* 1.84* 1.66*  CALCIUM 9.0 8.7 8.9 8.8 8.8 8.2*  MG 1.8  --   --   --   --   --     Liver Function Tests:  Recent Labs Lab 01/24/14 1909  AST 67*  ALT 20  ALKPHOS 80  BILITOT 2.4*  PROT 6.4  ALBUMIN 3.1*   No results found for this basename: LIPASE, AMYLASE,  in the last 168 hours No results found for this basename: AMMONIA,  in the last 168 hours  CBC:  Recent Labs Lab 01/24/14 1909 01/27/14 0415  WBC 8.4 9.2  NEUTROABS 6.4  --   HGB 11.9* 11.2*  HCT 34.8* 32.3*  MCV 87.7 85.0  PLT 242 239    Cardiac Enzymes:  Recent Labs Lab 01/24/14 1909 01/25/14 0330 01/25/14 0920  TROPONINI <0.30 <0.30 <0.30    BNP: BNP (last 3 results)  Recent Labs  08/31/13 1322 01/24/14 1909  PROBNP 230.0* 6721.0*     Other results:  EKG:   Imaging:  No results found.   Medications:     Scheduled Medications: . amiodarone  200 mg Oral BID  . apixaban  2.5 mg Oral BID  . atorvastatin  40 mg Oral QHS  . furosemide  40 mg Oral Daily  . hydrALAZINE  12.5 mg Oral 3 times per day  . insulin aspart  0-15 Units Subcutaneous TID WC  . isosorbide mononitrate  15 mg Oral Daily  . mexiletine  150 mg Oral Q12H  . mometasone-formoterol  2 puff Inhalation BID  . sodium chloride  3 mL Intravenous Q12H  . tiotropium  18 mcg Inhalation Daily    Infusions: . sodium chloride 10 mL/hr (01/27/14 1948)  . milrinone 0.25 mcg/kg/min (01/28/14 1657)    PRN Medications: sodium chloride, acetaminophen, ondansetron (ZOFRAN) IV, sodium chloride   Assessment:   1) A/C systolic HF - EF 34-91% 2) Cardiorenal syndrome 3)  Atrial flutter - on apixaban 4) AKI on CKD stage IIII - baseline Cr 2.1-2.3 5) CAD - s/p CABG 6) Sigmoid colon thickening/stricture (never biopsied) 7) Cardiogenic shock.  8) hyponatremia   Plan/Discussion:    [name] was admitted 9/1 with low output and co-ox, 49%. He was started on milrinone 0.25 mcg and IV lasix for massive volume overload.   Continues to improve. Co-ox good on milrinone. CVP 5-6  Creatinine continues to get better - now at baseline. Would continue milrinone at current dose throughout the weekend and see how good his renal function will get. Renal function back to baseline. Will attempt milrinone wean (possibly starting tomorrow) with low threshold for home inotropes/advanced therapies which I think he will need. Have started low-dose Hydral/NTG. Increase as tolerated by BP. No b-blocker or ACE due to shock and renal failure.  Jashaun Penrose,MD 10:58 AM

## 2014-01-30 LAB — BASIC METABOLIC PANEL
ANION GAP: 10 (ref 5–15)
BUN: 23 mg/dL (ref 6–23)
CHLORIDE: 93 meq/L — AB (ref 96–112)
CO2: 29 meq/L (ref 19–32)
Calcium: 8.6 mg/dL (ref 8.4–10.5)
Creatinine, Ser: 1.56 mg/dL — ABNORMAL HIGH (ref 0.50–1.35)
GFR calc non Af Amer: 41 mL/min — ABNORMAL LOW (ref >90)
GFR, EST AFRICAN AMERICAN: 48 mL/min — AB (ref >90)
Glucose, Bld: 122 mg/dL — ABNORMAL HIGH (ref 70–99)
Potassium: 4.1 mEq/L (ref 3.7–5.3)
Sodium: 132 mEq/L — ABNORMAL LOW (ref 137–147)

## 2014-01-30 LAB — GLUCOSE, CAPILLARY
GLUCOSE-CAPILLARY: 232 mg/dL — AB (ref 70–99)
Glucose-Capillary: 131 mg/dL — ABNORMAL HIGH (ref 70–99)
Glucose-Capillary: 159 mg/dL — ABNORMAL HIGH (ref 70–99)
Glucose-Capillary: 181 mg/dL — ABNORMAL HIGH (ref 70–99)

## 2014-01-30 LAB — CARBOXYHEMOGLOBIN
Carboxyhemoglobin: 2.4 % — ABNORMAL HIGH (ref 0.5–1.5)
Methemoglobin: 0.4 % (ref 0.0–1.5)
O2 Saturation: 86.9 %
Total hemoglobin: 11.3 g/dL — ABNORMAL LOW (ref 13.5–18.0)

## 2014-01-30 MED ORDER — POLYETHYLENE GLYCOL 3350 17 G PO PACK
17.0000 g | PACK | Freq: Every day | ORAL | Status: DC | PRN
  Administered 2014-01-30 – 2014-01-31 (×2): 17 g via ORAL
  Filled 2014-01-30 (×2): qty 1

## 2014-01-30 NOTE — Progress Notes (Signed)
Advanced Heart Failure Rounding Note   Subjective:    Mr. Delair is a 78 yo male with a history of HTN, CAD s/p CABG, ICM s/p ICD, chronic systolic heart failure, DM2, Vtach, atrial flutter, PVD and GI mass.   ECHO (12/22/13) EF 20-25%, mod MR, no evidence of thrombus in LA, mod TR.  Admitted 9/1 with low output HF and worsening renal failure. ECHO: EF 40-45%, RV mild dysfucntion TAPSE 1.34 Initial co-ox 49% and started on milrinone. Transitioned to PO diuretics yesterday.   Renal function continues to improve on IV milrinone and co-ox 86% (?).Denies SOB, orthopnea or CP.  Weight stable. CVP 5-6   Objective:   Weight Range:  Vital Signs:   Temp:  [98.1 F (36.7 C)-98.9 F (37.2 C)] 98.6 F (37 C) (09/07 0800) Pulse Rate:  [85-96] 96 (09/06 2321) Resp:  [15-22] 19 (09/07 1145) BP: (109-137)/(47-74) 123/47 mmHg (09/07 0800) SpO2:  [94 %-97 %] 94 % (09/07 0818) Weight:  [69.4 kg (153 lb)] 69.4 kg (153 lb) (09/07 0500) Last BM Date: 01/25/14  Weight change: Filed Weights   01/28/14 0317 01/29/14 0300 01/30/14 0500  Weight: 70.7 kg (155 lb 13.8 oz) 69.6 kg (153 lb 7 oz) 69.4 kg (153 lb)    Intake/Output:   Intake/Output Summary (Last 24 hours) at 01/30/14 1209 Last data filed at 01/30/14 1100  Gross per 24 hour  Intake 1251.9 ml  Output   1900 ml  Net -648.1 ml     Physical Exam: General:  Sitting in bed. No resp difficulty HEENT: normal Neck: supple. JVP 5, RIJ 3L CL. Carotids 2+ bilat; no bruits. No lymphadenopathy or thryomegaly appreciated. Cor: PMI nondisplaced. Regular rate & rhythm. No rubs, gallops or murmurs. Lungs: clear Abdomen: soft, nontender, mildly distended. No hepatosplenomegaly. No bruits or masses. Good bowel sounds. Extremities: no cyanosis, clubbing, rash, no edema Neuro: alert & orientedx3, cranial nerves grossly intact. moves all 4 extremities w/o difficulty. Affect pleasant  Telemetry: NSR 60s  Labs: Basic Metabolic Panel:  Recent  Labs Lab 01/24/14 1909  01/26/14 0340 01/27/14 0415 01/28/14 0453 01/29/14 0400 01/30/14 0500  NA 133*  < > 133* 131* 133* 132* 132*  K 5.7*  < > 5.0 4.0 4.5 3.8 4.1  CL 94*  < > 90* 86* 92* 94* 93*  CO2 27  < > 33* 32 31 28 29   GLUCOSE 113*  < > 121* 167* 149* 167* 122*  BUN 64*  < > 53* 44* 31* 27* 23  CREATININE 2.82*  < > 2.72* 2.42* 1.84* 1.66* 1.56*  CALCIUM 9.0  < > 8.9 8.8 8.8 8.2* 8.6  MG 1.8  --   --   --   --   --   --   < > = values in this interval not displayed.  Liver Function Tests:  Recent Labs Lab 01/24/14 1909  AST 67*  ALT 20  ALKPHOS 80  BILITOT 2.4*  PROT 6.4  ALBUMIN 3.1*   No results found for this basename: LIPASE, AMYLASE,  in the last 168 hours No results found for this basename: AMMONIA,  in the last 168 hours  CBC:  Recent Labs Lab 01/24/14 1909 01/27/14 0415  WBC 8.4 9.2  NEUTROABS 6.4  --   HGB 11.9* 11.2*  HCT 34.8* 32.3*  MCV 87.7 85.0  PLT 242 239    Cardiac Enzymes:  Recent Labs Lab 01/24/14 1909 01/25/14 0330 01/25/14 0920  TROPONINI <0.30 <0.30 <0.30    BNP: BNP (  last 3 results)  Recent Labs  08/31/13 1322 01/24/14 1909  PROBNP 230.0* 6721.0*     Other results:  EKG:   Imaging: No results found.   Medications:     Scheduled Medications: . amiodarone  200 mg Oral BID  . apixaban  2.5 mg Oral BID  . atorvastatin  40 mg Oral QHS  . furosemide  40 mg Oral Daily  . hydrALAZINE  25 mg Oral 3 times per day  . insulin aspart  0-15 Units Subcutaneous TID WC  . isosorbide mononitrate  30 mg Oral Daily  . mexiletine  150 mg Oral Q12H  . mometasone-formoterol  2 puff Inhalation BID  . sodium chloride  3 mL Intravenous Q12H  . tiotropium  18 mcg Inhalation Daily    Infusions: . sodium chloride 10 mL/hr at 01/29/14 2000  . milrinone 0.25 mcg/kg/min (01/30/14 0800)    PRN Medications: sodium chloride, acetaminophen, ondansetron (ZOFRAN) IV, sodium chloride   Assessment:   1) A/C systolic  HF - EF 05-39% 2) Cardiorenal syndrome 3) Atrial flutter - on apixaban 4) AKI on CKD stage IIII - baseline Cr 2.1-2.3 5) CAD - s/p CABG 6) Sigmoid colon thickening/stricture (never biopsied) 7) Cardiogenic shock.  8) hyponatremia   Plan/Discussion:    Mr. Ishman was admitted 9/1 with low output and co-ox, 49%. He was started on milrinone 0.25 mcg and IV lasix for massive volume overload.   Continues to improve. Co-ox good on milrinone. CVP 5-6  Creatinine continues to get better. Would continue milrinone at current dose throughout the weekend and see how good his renal function will get. Once renal function hits nadir will attempt milrinone wean (possibly starting tomorrow) with low threshold for home inotropes/advanced therapies which I think he will need. Have started low-dose Hydral/NTG. Increase as tolerated by BP. No b-blocker or ACE due to shock and renal failure.  Daniel Bensimhon,MD 12:09 PM

## 2014-01-31 LAB — BASIC METABOLIC PANEL
Anion gap: 11 (ref 5–15)
BUN: 23 mg/dL (ref 6–23)
CHLORIDE: 92 meq/L — AB (ref 96–112)
CO2: 29 meq/L (ref 19–32)
CREATININE: 1.48 mg/dL — AB (ref 0.50–1.35)
Calcium: 8.4 mg/dL (ref 8.4–10.5)
GFR calc Af Amer: 51 mL/min — ABNORMAL LOW (ref >90)
GFR calc non Af Amer: 44 mL/min — ABNORMAL LOW (ref >90)
Glucose, Bld: 145 mg/dL — ABNORMAL HIGH (ref 70–99)
Potassium: 3.8 mEq/L (ref 3.7–5.3)
Sodium: 132 mEq/L — ABNORMAL LOW (ref 137–147)

## 2014-01-31 LAB — CBC
HEMATOCRIT: 32 % — AB (ref 39.0–52.0)
HEMOGLOBIN: 11 g/dL — AB (ref 13.0–17.0)
MCH: 29.4 pg (ref 26.0–34.0)
MCHC: 34.4 g/dL (ref 30.0–36.0)
MCV: 85.6 fL (ref 78.0–100.0)
Platelets: 226 10*3/uL (ref 150–400)
RBC: 3.74 MIL/uL — ABNORMAL LOW (ref 4.22–5.81)
RDW: 17.3 % — ABNORMAL HIGH (ref 11.5–15.5)
WBC: 10.1 10*3/uL (ref 4.0–10.5)

## 2014-01-31 LAB — CARBOXYHEMOGLOBIN
Carboxyhemoglobin: 2 % — ABNORMAL HIGH (ref 0.5–1.5)
Methemoglobin: 0.5 % (ref 0.0–1.5)
O2 Saturation: 74.3 %
Total hemoglobin: 13.1 g/dL — ABNORMAL LOW (ref 13.5–18.0)

## 2014-01-31 LAB — GLUCOSE, CAPILLARY
GLUCOSE-CAPILLARY: 186 mg/dL — AB (ref 70–99)
Glucose-Capillary: 144 mg/dL — ABNORMAL HIGH (ref 70–99)
Glucose-Capillary: 270 mg/dL — ABNORMAL HIGH (ref 70–99)
Glucose-Capillary: 193 mg/dL — ABNORMAL HIGH (ref 70–99)

## 2014-01-31 MED ORDER — APIXABAN 5 MG PO TABS
5.0000 mg | ORAL_TABLET | Freq: Two times a day (BID) | ORAL | Status: DC
  Administered 2014-01-31 – 2014-02-03 (×7): 5 mg via ORAL
  Filled 2014-01-31 (×8): qty 1

## 2014-01-31 MED ORDER — HYDRALAZINE HCL 25 MG PO TABS
37.5000 mg | ORAL_TABLET | Freq: Three times a day (TID) | ORAL | Status: DC
  Administered 2014-01-31 – 2014-02-02 (×6): 37.5 mg via ORAL
  Filled 2014-01-31 (×9): qty 1.5

## 2014-01-31 NOTE — Progress Notes (Signed)
Advanced Heart Failure Rounding Note   Subjective:    [name] is a 78 yo male with a history of HTN, CAD s/p CABG, ICM s/p ICD, chronic systolic heart failure, DM2, Vtach, atrial flutter, PVD and GI mass.   ECHO (12/22/13) EF 20-25%, mod MR, no evidence of thrombus in LA, mod TR.  Admitted 9/1 with low output HF and worsening renal failure. ECHO: EF 40-45%, RV mild dysfucntion TAPSE 1.34 Initial co-ox 49% and started on milrinone. Transitioned to PO diuretics yesterday.   Yesterday hydralazine/imdur added. CO-OX 74% on Milrinone 0.25 mcg. Creatinine trending down 1.5> 1.4. Weight unchanged. Overall weight down 18 pounds.   Denies SOB/PND/Orthopnea. Intermittent nausea.      Objective:   Weight Range:  Vital Signs:   Temp:  [97.9 F (36.6 C)-98.6 F (37 C)] 98.3 F (36.8 C) (09/08 0344) Pulse Rate:  [89-93] 92 (09/08 0700) Resp:  [17-20] 18 (09/07 2340) BP: (111-138)/(47-88) 121/64 mmHg (09/08 0344) SpO2:  [88 %-100 %] 95 % (09/08 0700) Weight:  [153 lb 10.6 oz (69.7 kg)] 153 lb 10.6 oz (69.7 kg) (09/08 0347) Last BM Date: 01/25/14  Weight change: Filed Weights   01/29/14 0300 01/30/14 0500 01/31/14 0347  Weight: 153 lb 7 oz (69.6 kg) 153 lb (69.4 kg) 153 lb 10.6 oz (69.7 kg)    Intake/Output:   Intake/Output Summary (Last 24 hours) at 01/31/14 0747 Last data filed at 01/31/14 0600  Gross per 24 hour  Intake  611.9 ml  Output   2350 ml  Net -1738.1 ml     Physical Exam: General:  Lying in bed.  No resp difficulty HEENT: normal Neck: supple. JVP 5-6  RIJ 3L CL. Carotids 2+ bilat; no bruits. No lymphadenopathy or thryomegaly appreciated. Cor: PMI nondisplaced. Regular rate & rhythm. No rubs, gallops or murmurs. Lungs: clear Abdomen: soft, nontender, mildly distended. No hepatosplenomegaly. No bruits or masses. Good bowel sounds. Extremities: no cyanosis, clubbing, rash, no edema Neuro: alert & orientedx3, cranial nerves grossly intact. moves all 4  extremities w/o difficulty. Affect pleasant  Telemetry: NSR 60s  Labs: Basic Metabolic Panel:  Recent Labs Lab 01/24/14 1909  01/27/14 0415 01/28/14 0453 01/29/14 0400 01/30/14 0500 01/31/14 0330  NA 133*  < > 131* 133* 132* 132* 132*  K 5.7*  < > 4.0 4.5 3.8 4.1 3.8  CL 94*  < > 86* 92* 94* 93* 92*  CO2 27  < > 32 31 28 29 29   GLUCOSE 113*  < > 167* 149* 167* 122* 145*  BUN 64*  < > 44* 31* 27* 23 23  CREATININE 2.82*  < > 2.42* 1.84* 1.66* 1.56* 1.48*  CALCIUM 9.0  < > 8.8 8.8 8.2* 8.6 8.4  MG 1.8  --   --   --   --   --   --   < > = values in this interval not displayed.  Liver Function Tests:  Recent Labs Lab 01/24/14 1909  AST 67*  ALT 20  ALKPHOS 80  BILITOT 2.4*  PROT 6.4  ALBUMIN 3.1*   No results found for this basename: LIPASE, AMYLASE,  in the last 168 hours No results found for this basename: AMMONIA,  in the last 168 hours  CBC:  Recent Labs Lab 01/24/14 1909 01/27/14 0415 01/31/14 0330  WBC 8.4 9.2 10.1  NEUTROABS 6.4  --   --   HGB 11.9* 11.2* 11.0*  HCT 34.8* 32.3* 32.0*  MCV 87.7 85.0 85.6  PLT 242 239  226    Cardiac Enzymes:  Recent Labs Lab 01/24/14 1909 01/25/14 0330 01/25/14 0920  TROPONINI <0.30 <0.30 <0.30    BNP: BNP (last 3 results)  Recent Labs  08/31/13 1322 01/24/14 1909  PROBNP 230.0* 6721.0*     Other results:  EKG:   Imaging: No results found.   Medications:     Scheduled Medications: . amiodarone  200 mg Oral BID  . apixaban  2.5 mg Oral BID  . atorvastatin  40 mg Oral QHS  . furosemide  40 mg Oral Daily  . hydrALAZINE  25 mg Oral 3 times per day  . insulin aspart  0-15 Units Subcutaneous TID WC  . isosorbide mononitrate  30 mg Oral Daily  . mexiletine  150 mg Oral Q12H  . mometasone-formoterol  2 puff Inhalation BID  . sodium chloride  3 mL Intravenous Q12H  . tiotropium  18 mcg Inhalation Daily    Infusions: . sodium chloride 10 mL/hr at 01/29/14 2000  . milrinone 0.25 mcg/kg/min  (01/31/14 0349)    PRN Medications: sodium chloride, acetaminophen, ondansetron (ZOFRAN) IV, polyethylene glycol, sodium chloride   Assessment:   1) A/C systolic HF - EF 96-78% 2) Cardiorenal syndrome 3) Atrial flutter - on apixaban 4) AKI on CKD stage IIII - baseline Cr 2.1-2.3 5) CAD - s/p CABG 6) Sigmoid colon thickening/stricture (never biopsied) 7) Cardiogenic shock.  8) hyponatremia 9) Nausea ? Low output versus stricture   Plan/Discussion:    [name] was admitted 9/1 with low output and co-ox, 49%. He was started on milrinone 0.25 mcg and IV lasix for massive volume overload.   CO-OX today 74%. Volume status stable. CVP 5. Hold diuretics. Today. Weight unchanged. Weight down 18 pounds. Will wean milrinone 0.125 mcg. Hold off on bb/ ACE due to shock and renal failure. Continue hydralazine/imdur. Increase to hydralazine 37.5 mg tid/ imdur 30 mg daily.   Continue CR.   CLEGG,AMY,NP-C  7:47 AM  Patient seen and examined with Darrick Grinder, NP. We discussed all aspects of the encounter. I agree with the assessment and plan as stated above.   Much improved with milrinone. Will begin wean today and follow co-ox closely. Continue to titrate hydralazine and NTG. Recheck lipid panel before restarting fibrate.    Kalisha Keadle,MD 8:17 AM

## 2014-01-31 NOTE — Progress Notes (Signed)
CARDIAC REHAB PHASE I   PRE:  Rate/Rhythm: 94 SR  BP:  Supine:   Sitting: 139/56  Standing:    SaO2: 96 2L 98 RA  MODE:  Ambulation: 610 ft   POST:  Rate/Rhythm: 131  BP:  Supine:   Sitting: 160/83  Standing:    SaO2: 89 RA 98 2L 1355-1345  On arrival pt very talkative and states that he feels better today. Assisted X 1 and used walker to ambulate. Pt walked 610 feet without c/o. HR after walk 131 and RA sat 89%. Pt back to recliner after walk with call light in reach. O2 reapplied 2L and sat returned to 98%. Will continue to follow pt.  Rodney Langton RN 01/31/2014 2:47 PM

## 2014-02-01 ENCOUNTER — Ambulatory Visit: Payer: Medicare Other | Admitting: Family Medicine

## 2014-02-01 DIAGNOSIS — R570 Cardiogenic shock: Secondary | ICD-10-CM

## 2014-02-01 LAB — BASIC METABOLIC PANEL
Anion gap: 12 (ref 5–15)
BUN: 22 mg/dL (ref 6–23)
CHLORIDE: 93 meq/L — AB (ref 96–112)
CO2: 28 mEq/L (ref 19–32)
Calcium: 8.4 mg/dL (ref 8.4–10.5)
Creatinine, Ser: 1.41 mg/dL — ABNORMAL HIGH (ref 0.50–1.35)
GFR calc non Af Amer: 47 mL/min — ABNORMAL LOW (ref >90)
GFR, EST AFRICAN AMERICAN: 54 mL/min — AB (ref >90)
Glucose, Bld: 148 mg/dL — ABNORMAL HIGH (ref 70–99)
POTASSIUM: 4 meq/L (ref 3.7–5.3)
Sodium: 133 mEq/L — ABNORMAL LOW (ref 137–147)

## 2014-02-01 LAB — GLUCOSE, CAPILLARY
Glucose-Capillary: 143 mg/dL — ABNORMAL HIGH (ref 70–99)
Glucose-Capillary: 207 mg/dL — ABNORMAL HIGH (ref 70–99)
Glucose-Capillary: 169 mg/dL — ABNORMAL HIGH (ref 70–99)
Glucose-Capillary: 207 mg/dL — ABNORMAL HIGH (ref 70–99)

## 2014-02-01 LAB — CARBOXYHEMOGLOBIN
CARBOXYHEMOGLOBIN: 1.9 % — AB (ref 0.5–1.5)
METHEMOGLOBIN: 0.7 % (ref 0.0–1.5)
O2 SAT: 71 %
TOTAL HEMOGLOBIN: 10.5 g/dL — AB (ref 13.5–18.0)

## 2014-02-01 LAB — LIPID PANEL
CHOLESTEROL: 83 mg/dL (ref 0–200)
HDL: 10 mg/dL — AB (ref >39)
LDL CALC: 53 mg/dL (ref 0–99)
TRIGLYCERIDES: 102 mg/dL (ref <150)
Total CHOL/HDL Ratio: 8.3 RATIO
VLDL: 20 mg/dL (ref 0–40)

## 2014-02-01 NOTE — Progress Notes (Signed)
CARDIAC REHAB PHASE I   PRE:  Rate/Rhythm: 97 SR  BP:  Supine:   Sitting: 137/69  Standing:    SaO2: 97%RA  MODE:  Ambulation: 700 ft   POST:  Rate/Rhythm: 116 ST  BP:  Supine:   Sitting: 145/76  Standing:    SaO2: 92%RA 1126-1200 Pt walked 700 ft on RA with rolling walker and steady gait. Stopped several times to catch his breath but tolerated well. Gave CHF packet for pt and wife to review.   [name], RN BSN  02/01/2014 11:57 AM

## 2014-02-01 NOTE — Progress Notes (Signed)
Nutrition Brief Note  Patient identified on the Malnutrition Screening Tool (MST) Report  Wt Readings from Last 15 Encounters:  02/01/14 153 lb (69.4 kg)  01/24/14 172 lb (78.019 kg)  01/11/14 165 lb (74.844 kg)  01/03/14 163 lb (73.936 kg)  01/02/14 164 lb 12.8 oz (74.753 kg)  12/29/13 167 lb 3.2 oz (75.841 kg)  12/26/13 167 lb (75.751 kg)  12/23/13 168 lb (76.204 kg)  12/22/13 163 lb (73.936 kg)  12/22/13 163 lb (73.936 kg)  12/12/13 160 lb (72.576 kg)  08/31/13 168 lb 9.6 oz (76.476 kg)  06/21/13 166 lb 1.9 oz (75.352 kg)  05/09/13 163 lb 9.6 oz (74.208 kg)  01/11/13 159 lb (72.122 kg)    Body mass index is 24.01 kg/(m^2). Patient meets criteria for normal range based on current BMI.   Current diet order is carb modified, patient is consuming approximately 100% of meals at this time. Labs and medications reviewed.   Pt weight has been stable, wt loss since admission is d/t fluid loss by diuretic use. Pt appetite and intake are adequate.   No nutrition interventions warranted at this time. If nutrition issues arise, please consult RD.   Clayton Bibles, [place], [place] Licensed Dietitian Nutritionist Pager: [phone]

## 2014-02-01 NOTE — Progress Notes (Signed)
Advanced Heart Failure Rounding Note   Subjective:    [name] is a 78 yo male with a history of HTN, CAD s/p CABG, ICM s/p ICD, chronic systolic heart failure, DM2, Vtach, atrial flutter, PVD and GI mass.   ECHO (12/22/13) EF 20-25%, mod MR, no evidence of thrombus in LA, mod TR.  Admitted 9/1 with low output HF and worsening renal failure. ECHO: EF 40-45%, RV mild dysfucntion TAPSE 1.34 Initial co-ox 49% and started on milrinone.   On po lasix now. Milrinone cut back yesterday. Co-ox stable. Weight stable. Creatinine continues to improve.   Denies SOB/PND/Orthopnea. Nausea improved. For gastric emptying study today.     Objective:   Weight Range:  Vital Signs:   Temp:  [97.7 F (36.5 C)-98.8 F (37.1 C)] 97.9 F (36.6 C) (09/09 0700) Pulse Rate:  [86-103] 95 (09/09 0700) Resp:  [16-22] 18 (09/09 0300) BP: (98-160)/(35-83) 114/40 mmHg (09/09 0700) SpO2:  [90 %-100 %] 99 % (09/09 0700) Weight:  [69.4 kg (153 lb)] 69.4 kg (153 lb) (09/09 0300) Last BM Date: 01/31/14  Weight change: Filed Weights   01/30/14 0500 01/31/14 0347 02/01/14 0300  Weight: 69.4 kg (153 lb) 69.7 kg (153 lb 10.6 oz) 69.4 kg (153 lb)    Intake/Output:   Intake/Output Summary (Last 24 hours) at 02/01/14 0831 Last data filed at 02/01/14 0700  Gross per 24 hour  Intake  999.8 ml  Output   1425 ml  Net -425.2 ml     Physical Exam: General:  Lying in bed.  No resp difficulty HEENT: normal Neck: supple. JVP 5-6  RIJ 3L CL. Carotids 2+ bilat; no bruits. No lymphadenopathy or thryomegaly appreciated. Cor: PMI nondisplaced. Regular rate & rhythm. No rubs, gallops or murmurs. Lungs: clear Abdomen: soft, nontender, mildly distended. No hepatosplenomegaly. No bruits or masses. Good bowel sounds. Extremities: no cyanosis, clubbing, rash, no edema Neuro: alert & orientedx3, cranial nerves grossly intact. moves all 4 extremities w/o difficulty. Affect pleasant  Telemetry: NSR 60s  Labs: Basic  Metabolic Panel:  Recent Labs Lab 01/28/14 0453 01/29/14 0400 01/30/14 0500 01/31/14 0330 02/01/14 0430  NA 133* 132* 132* 132* 133*  K 4.5 3.8 4.1 3.8 4.0  CL 92* 94* 93* 92* 93*  CO2 31 28 29 29 28   GLUCOSE 149* 167* 122* 145* 148*  BUN 31* 27* 23 23 22   CREATININE 1.84* 1.66* 1.56* 1.48* 1.41*  CALCIUM 8.8 8.2* 8.6 8.4 8.4    Liver Function Tests: No results found for this basename: AST, ALT, ALKPHOS, BILITOT, PROT, ALBUMIN,  in the last 168 hours No results found for this basename: LIPASE, AMYLASE,  in the last 168 hours No results found for this basename: AMMONIA,  in the last 168 hours  CBC:  Recent Labs Lab 01/27/14 0415 01/31/14 0330  WBC 9.2 10.1  HGB 11.2* 11.0*  HCT 32.3* 32.0*  MCV 85.0 85.6  PLT 239 226    Cardiac Enzymes:  Recent Labs Lab 01/25/14 0920  TROPONINI <0.30    BNP: BNP (last 3 results)  Recent Labs  08/31/13 1322 01/24/14 1909  PROBNP 230.0* 6721.0*     Other results:  EKG:   Imaging: No results found.   Medications:     Scheduled Medications: . amiodarone  200 mg Oral BID  . apixaban  5 mg Oral BID  . atorvastatin  40 mg Oral QHS  . furosemide  40 mg Oral Daily  . hydrALAZINE  37.5 mg Oral 3 times per day  .  insulin aspart  0-15 Units Subcutaneous TID WC  . isosorbide mononitrate  30 mg Oral Daily  . mexiletine  150 mg Oral Q12H  . mometasone-formoterol  2 puff Inhalation BID  . sodium chloride  3 mL Intravenous Q12H  . tiotropium  18 mcg Inhalation Daily    Infusions: . sodium chloride 10 mL/hr at 02/01/14 0806  . milrinone 0.125 mcg/kg/min (02/01/14 0805)    PRN Medications: sodium chloride, acetaminophen, ondansetron (ZOFRAN) IV, polyethylene glycol, sodium chloride   Assessment:   1) A/C systolic HF - EF 02-77% 2) Cardiorenal syndrome 3) Atrial flutter - on apixaban 4) AKI on CKD stage IIII - baseline Cr 2.1-2.3 5) CAD - s/p CABG 6) Sigmoid colon thickening/stricture (never  biopsied) 7) Cardiogenic shock.  8) hyponatremia 9) Nausea ? Low output versus stricture   Plan/Discussion:    [name] was admitted 9/1 with low output and co-ox, 49%. He was started on milrinone 0.25 mcg and IV lasix for massive volume overload. Now on po lasix.   Tolerating milrinone wean well. Co-ox 71%. Will stop milrinone. BP a bit too soft to titrate hydralazine further. Keep in SDU today to follow co-ox and CVPs closely off inotropes.   I suspect he may need advanced therapies in the near future.   Benay Spice 8:31 AM

## 2014-02-01 NOTE — Progress Notes (Signed)
Note/chart reviewed. Agree with note.   Danyel Tobey RD, LDN, CNSC [phone] Pager [phone] After Hours Pager   

## 2014-02-02 ENCOUNTER — Inpatient Hospital Stay (HOSPITAL_COMMUNITY): Payer: Medicare Other

## 2014-02-02 HISTORY — PX: OTHER SURGICAL HISTORY: SHX169

## 2014-02-02 LAB — CARBOXYHEMOGLOBIN
CARBOXYHEMOGLOBIN: 1.9 % — AB (ref 0.5–1.5)
Methemoglobin: 0.5 % (ref 0.0–1.5)
O2 SAT: 67.2 %
Total hemoglobin: 10.6 g/dL — ABNORMAL LOW (ref 13.5–18.0)

## 2014-02-02 LAB — BASIC METABOLIC PANEL
Anion gap: 11 (ref 5–15)
BUN: 26 mg/dL — ABNORMAL HIGH (ref 6–23)
CO2: 29 mEq/L (ref 19–32)
CREATININE: 1.43 mg/dL — AB (ref 0.50–1.35)
Calcium: 8.2 mg/dL — ABNORMAL LOW (ref 8.4–10.5)
Chloride: 93 mEq/L — ABNORMAL LOW (ref 96–112)
GFR, EST AFRICAN AMERICAN: 53 mL/min — AB (ref >90)
GFR, EST NON AFRICAN AMERICAN: 46 mL/min — AB (ref >90)
Glucose, Bld: 199 mg/dL — ABNORMAL HIGH (ref 70–99)
POTASSIUM: 3.7 meq/L (ref 3.7–5.3)
Sodium: 133 mEq/L — ABNORMAL LOW (ref 137–147)

## 2014-02-02 LAB — GLUCOSE, CAPILLARY
GLUCOSE-CAPILLARY: 187 mg/dL — AB (ref 70–99)
GLUCOSE-CAPILLARY: 226 mg/dL — AB (ref 70–99)
Glucose-Capillary: 222 mg/dL — ABNORMAL HIGH (ref 70–99)
Glucose-Capillary: 128 mg/dL — ABNORMAL HIGH (ref 70–99)

## 2014-02-02 MED ORDER — TECHNETIUM TC 99M SULFUR COLLOID: 2.0000 | Freq: Once | INTRAVENOUS | Status: AC | PRN

## 2014-02-02 MED ORDER — POTASSIUM CHLORIDE CRYS ER 20 MEQ PO TBCR
20.0000 meq | EXTENDED_RELEASE_TABLET | Freq: Once | ORAL | Status: AC
  Administered 2014-02-02: 20 meq via ORAL
  Filled 2014-02-02: qty 1

## 2014-02-02 MED ORDER — HYDRALAZINE HCL 50 MG PO TABS
50.0000 mg | ORAL_TABLET | Freq: Three times a day (TID) | ORAL | Status: DC
  Administered 2014-02-02 – 2014-02-03 (×3): 50 mg via ORAL
  Filled 2014-02-02 (×6): qty 1

## 2014-02-02 NOTE — Progress Notes (Signed)
Advanced Heart Failure Rounding Note   Subjective:    Mr. Heckmann is a 78 yo male with a history of HTN, CAD s/p CABG, ICM s/p ICD, chronic systolic heart failure, DM2, Vtach, atrial flutter, PVD and GI mass.   ECHO (12/22/13) EF 20-25%, mod MR, no evidence of thrombus in LA, mod TR.  Admitted 9/1 with low output HF and worsening renal failure. ECHO: EF 40-45%, RV mild dysfucntion TAPSE 1.34 Initial co-ox 49% and started on milrinone.   Yesterday milrinone was stopped and he remained on po lasix. Weight unchanged.   Gastric emptying study --> Normal.    CO-OX 67%   Objective:   Weight Range:  Vital Signs:   Temp:  [97.9 F (36.6 C)-99.5 F (37.5 C)] 98 F (36.7 C) (09/10 0742) Pulse Rate:  [71-106] 71 (09/10 0742) Resp:  [13-22] 13 (09/10 0742) BP: (103-145)/(47-78) 111/47 mmHg (09/10 0742) SpO2:  [93 %-100 %] 99 % (09/10 0742) Weight:  [153 lb 1.6 oz (69.446 kg)] 153 lb 1.6 oz (69.446 kg) (09/10 0300) Last BM Date: 01/31/14  Weight change: Filed Weights   01/31/14 0347 02/01/14 0300 02/02/14 0300  Weight: 153 lb 10.6 oz (69.7 kg) 153 lb (69.4 kg) 153 lb 1.6 oz (69.446 kg)    Intake/Output:   Intake/Output Summary (Last 24 hours) at 02/02/14 1104 Last data filed at 02/02/14 0939  Gross per 24 hour  Intake    900 ml  Output   1500 ml  Net   -600 ml     Physical Exam: General:  Lying in bed.  No resp difficulty HEENT: normal Neck: supple. JVP 5-6  RIJ 3L CL. Carotids 2+ bilat; no bruits. No lymphadenopathy or thryomegaly appreciated. Cor: PMI nondisplaced. Regular rate & rhythm. No rubs, gallops or murmurs. Lungs: clear Abdomen: soft, nontender, mildly distended. No hepatosplenomegaly. No bruits or masses. Good bowel sounds. Extremities: no cyanosis, clubbing, rash, no edema Neuro: alert & orientedx3, cranial nerves grossly intact. moves all 4 extremities w/o difficulty. Affect pleasant  Telemetry: NSR 60s  Labs: Basic Metabolic Panel:  Recent Labs Lab  01/29/14 0400 01/30/14 0500 01/31/14 0330 02/01/14 0430 02/02/14 0400  NA 132* 132* 132* 133* 133*  K 3.8 4.1 3.8 4.0 3.7  CL 94* 93* 92* 93* 93*  CO2 28 29 29 28 29   GLUCOSE 167* 122* 145* 148* 199*  BUN 27* 23 23 22  26*  CREATININE 1.66* 1.56* 1.48* 1.41* 1.43*  CALCIUM 8.2* 8.6 8.4 8.4 8.2*    Liver Function Tests: No results found for this basename: AST, ALT, ALKPHOS, BILITOT, PROT, ALBUMIN,  in the last 168 hours No results found for this basename: LIPASE, AMYLASE,  in the last 168 hours No results found for this basename: AMMONIA,  in the last 168 hours  CBC:  Recent Labs Lab 01/27/14 0415 01/31/14 0330  WBC 9.2 10.1  HGB 11.2* 11.0*  HCT 32.3* 32.0*  MCV 85.0 85.6  PLT 239 226    Cardiac Enzymes: No results found for this basename: CKTOTAL, CKMB, CKMBINDEX, TROPONINI,  in the last 168 hours  BNP: BNP (last 3 results)  Recent Labs  08/31/13 1322 01/24/14 1909  PROBNP 230.0* 6721.0*     Other results:  EKG:   Imaging: Nm Gastric Emptying  02/02/2014   CLINICAL DATA:  Persistent nausea  EXAM: NUCLEAR MEDICINE GASTRIC EMPTYING SCAN  TECHNIQUE: After oral ingestion of radiolabeled meal, sequential abdominal images were obtained for 120 minutes. Residual percentage of activity remaining within the stomach was  calculated at 60 and 120 minutes.  RADIOPHARMACEUTICALS:  2 mCi of Technetium 99-m labeled sulfur colloid  COMPARISON:  None.  FINDINGS: Expected location of the stomach in the left upper quadrant. Ingested meal empties the stomach gradually over the course of the study with 62% retention at 60 min and 2% retention at 120 min (normal retention less than 30% at a 120 min).  IMPRESSION: Normal gastric emptying study.   Electronically Signed   By: Franchot Gallo M.D.   On: 02/02/2014 09:33     Medications:     Scheduled Medications: . amiodarone  200 mg Oral BID  . apixaban  5 mg Oral BID  . atorvastatin  40 mg Oral QHS  . furosemide  40 mg Oral  Daily  . hydrALAZINE  37.5 mg Oral 3 times per day  . insulin aspart  0-15 Units Subcutaneous TID WC  . isosorbide mononitrate  30 mg Oral Daily  . mexiletine  150 mg Oral Q12H  . mometasone-formoterol  2 puff Inhalation BID  . sodium chloride  3 mL Intravenous Q12H  . tiotropium  18 mcg Inhalation Daily    Infusions: . sodium chloride Stopped (02/01/14 0910)    PRN Medications: sodium chloride, acetaminophen, ondansetron (ZOFRAN) IV, polyethylene glycol, sodium chloride, technetium sulfur colloid   Assessment:   1) A/C systolic HF - EF 16-60% 2) Cardiorenal syndrome 3) Atrial flutter - on apixaban 4) AKI on CKD stage IIII - baseline Cr 2.1-2.3 5) CAD - s/p CABG 6) Sigmoid colon thickening/stricture (never biopsied) 7) Cardiogenic shock.  8) hyponatremia 9) Nausea ? Low output versus stricture   Plan/Discussion:    Mr. Malizia was admitted 9/1 with low output and co-ox, 49%. He was started on milrinone 0.25 mcg and IV lasix for massive volume overload.   Stable off milrinone. CO-OX 67%.  Increase hydralazine to 50 mg tid.    CLEGG,AMY NP-C  11:04 AM  Patient seen and examined with Darrick Grinder, NP. We discussed all aspects of the encounter. I agree with the assessment and plan as stated above.   Stable off milrinone. Will watch one more day. If co-ox and renal function stable can go home tomorrow with close f/u in [facility] Clinic. Continue hydralazine/NTG.   Daniel Bensimhon,MD 1:18 PM

## 2014-02-02 NOTE — Progress Notes (Signed)
CARDIAC REHAB PHASE I   PRE:  Rate/Rhythm: 89 SR  BP:  Supine:   Sitting: 110/47  Standing:    SaO2: 97 RA  MODE:  Ambulation: 700 ft   POST:  Rate/Rhythm: 113 ST  BP:  Supine:   Sitting: 151/64  Standing:    SaO2: 91 RA 1040-1125 Assisted X 1 and used walker to ambulate. Gait steady with walker. He was able to walk 700 feet with one standing rest stop. He is SOB on exertion. RA sat after walk 91%. Pt to recliner after walk. We started Discussing Heart failure and I gave him low sodium diet sheets. He has not been mindful at all of his sodium intake. For lunch today he was trying to get a ham and cheese sandwich.  Rodney Langton RN 02/02/2014 11:29 AM

## 2014-02-03 LAB — BASIC METABOLIC PANEL
Anion gap: 12 (ref 5–15)
BUN: 23 mg/dL (ref 6–23)
CO2: 28 mEq/L (ref 19–32)
Calcium: 8.6 mg/dL (ref 8.4–10.5)
Chloride: 96 mEq/L (ref 96–112)
Creatinine, Ser: 1.42 mg/dL — ABNORMAL HIGH (ref 0.50–1.35)
GFR calc Af Amer: 54 mL/min — ABNORMAL LOW (ref >90)
GFR, EST NON AFRICAN AMERICAN: 46 mL/min — AB (ref >90)
Glucose, Bld: 121 mg/dL — ABNORMAL HIGH (ref 70–99)
POTASSIUM: 3.9 meq/L (ref 3.7–5.3)
SODIUM: 136 meq/L — AB (ref 137–147)

## 2014-02-03 LAB — CBC
HCT: 32.1 % — ABNORMAL LOW (ref 39.0–52.0)
Hemoglobin: 11 g/dL — ABNORMAL LOW (ref 13.0–17.0)
MCH: 29.6 pg (ref 26.0–34.0)
MCHC: 34.3 g/dL (ref 30.0–36.0)
MCV: 86.3 fL (ref 78.0–100.0)
PLATELETS: 214 10*3/uL (ref 150–400)
RBC: 3.72 MIL/uL — ABNORMAL LOW (ref 4.22–5.81)
RDW: 17.4 % — ABNORMAL HIGH (ref 11.5–15.5)
WBC: 9.6 10*3/uL (ref 4.0–10.5)

## 2014-02-03 LAB — CARBOXYHEMOGLOBIN
CARBOXYHEMOGLOBIN: 2.1 % — AB (ref 0.5–1.5)
Methemoglobin: 0.5 % (ref 0.0–1.5)
O2 SAT: 76.5 %
Total hemoglobin: 11.4 g/dL — ABNORMAL LOW (ref 13.5–18.0)

## 2014-02-03 LAB — GLUCOSE, CAPILLARY
GLUCOSE-CAPILLARY: 135 mg/dL — AB (ref 70–99)
Glucose-Capillary: 242 mg/dL — ABNORMAL HIGH (ref 70–99)

## 2014-02-03 MED ORDER — APIXABAN 5 MG PO TABS: 5.0000 mg | ORAL_TABLET | Freq: Two times a day (BID) | ORAL | Status: DC

## 2014-02-03 MED ORDER — FUROSEMIDE 40 MG PO TABS
40.0000 mg | ORAL_TABLET | Freq: Every day | ORAL | Status: DC
Start: 2014-02-03 — End: 2014-02-09

## 2014-02-03 MED ORDER — ISOSORBIDE MONONITRATE ER 30 MG PO TB24: 30.0000 mg | ORAL_TABLET | Freq: Every day | ORAL | Status: DC

## 2014-02-03 MED ORDER — HYDRALAZINE HCL 50 MG PO TABS: 50.0000 mg | ORAL_TABLET | Freq: Three times a day (TID) | ORAL | Status: DC

## 2014-02-03 NOTE — Discharge Instructions (Signed)
Heart Failure °Heart failure means your heart has trouble pumping blood. This makes it hard for your body to work well. Heart failure is usually a long-term (chronic) condition. You must take good care of yourself and follow your doctor's treatment plan. °HOME CARE °· Take your heart medicine as told by your doctor. °¨ Do not stop taking medicine unless your doctor tells you to. °¨ Do not skip any dose of medicine. °¨ Refill your medicines before they run out. °¨ Take other medicines only as told by your doctor or pharmacist. °· Stay active if told by your doctor. The elderly and people with severe heart failure should talk with a doctor about physical activity. °· Eat heart-healthy foods. Choose foods that are without trans fat and are low in saturated fat, cholesterol, and salt (sodium). This includes fresh or frozen fruits and vegetables, fish, lean meats, fat-free or low-fat dairy foods, whole grains, and high-fiber foods. Lentils and dried peas and beans (legumes) are also good choices. °· Limit salt if told by your doctor. °· Cook in a healthy way. Roast, grill, broil, bake, poach, steam, or stir-fry foods. °· Limit fluids as told by your doctor. °· Weigh yourself every morning. Do this after you pee (urinate) and before you eat breakfast. Write down your weight to give to your doctor. °· Take your blood pressure and write it down if your doctor tells you to. °· Ask your doctor how to check your pulse. Check your pulse as told. °· Lose weight if told by your doctor. °· Stop smoking or chewing tobacco. Do not use gum or patches that help you quit without your doctor's approval. °· Schedule and go to doctor visits as told. °· Nonpregnant women should have no more than 1 drink a day. Men should have no more than 2 drinks a day. Talk to your doctor about drinking alcohol. °· Stop illegal drug use. °· Stay current with shots (immunizations). °· Manage your health conditions as told by your doctor. °· Learn to  manage your stress. °· Rest when you are tired. °· If it is really hot outside: °¨ Avoid intense activities. °¨ Use air conditioning or fans, or get in a cooler place. °¨ Avoid caffeine and alcohol. °¨ Wear loose-fitting, lightweight, and light-colored clothing. °· If it is really cold outside: °¨ Avoid intense activities. °¨ Layer your clothing. °¨ Wear mittens or gloves, a hat, and a scarf when going outside. °¨ Avoid alcohol. °· Learn about heart failure and get support as needed. °· Get help to maintain or improve your quality of life and your ability to care for yourself as needed. °GET HELP IF:  °· You gain 03 lb/1.4 kg or more in 1 day or 05 lb/2.3 kg in a week. °· You are more short of breath than usual. °· You cannot do your normal activities. °· You tire easily. °· You cough more than normal, especially with activity. °· You have any or more puffiness (swelling) in areas such as your hands, feet, ankles, or belly (abdomen). °· You cannot sleep because it is hard to breathe. °· You feel like your heart is beating fast (palpitations). °· You get dizzy or light-headed when you stand up. °GET HELP RIGHT AWAY IF:  °· You have trouble breathing. °· There is a change in mental status, such as becoming less alert or not being able to focus. °· You have chest pain or discomfort. °· You faint. °MAKE SURE YOU:  °· Understand these instructions. °·   Will watch your condition.  Will get help right away if you are not doing well or get worse. Document Released: 02/19/2008 Document Revised: 09/26/2013 Document Reviewed: 06/28/2012 [facility] Patient Information 2015 [place], [place]. This information is not intended to replace advice given to you by your health care provider. Make sure you discuss any questions you have with your health care provider.  Information on my medicine - ELIQUIS (apixaban)  This medication education was reviewed with me or my healthcare representative as part of my discharge preparation.   The pharmacist that spoke with me during my hospital stay was:  Von Nils Flack, RPH  Why was Eliquis prescribed for you? Eliquis was prescribed for you to reduce the risk of a blood clot forming that can cause a stroke if you have a medical condition called atrial fibrillation (a type of irregular heartbeat).  What do You need to know about Eliquis ? Take your Eliquis TWICE DAILY - one tablet in the morning and one tablet in the evening with or without food. If you have difficulty swallowing the tablet whole please discuss with your pharmacist how to take the medication safely.  Take Eliquis exactly as prescribed by your doctor and DO NOT stop taking Eliquis without talking to the doctor who prescribed the medication.  Stopping may increase your risk of developing a stroke.  Refill your prescription before you run out.  After discharge, you should have regular check-up appointments with your healthcare provider that is prescribing your Eliquis.  In the future your dose may need to be changed if your kidney function or weight changes by a significant amount or as you get older.  What do you do if you miss a dose? If you miss a dose, take it as soon as you remember on the same day and resume taking twice daily.  Do not take more than one dose of ELIQUIS at the same time to make up a missed dose.  Important Safety Information A possible side effect of Eliquis is bleeding. You should call your healthcare provider right away if you experience any of the following:   Bleeding from an injury or your nose that does not stop.   Unusual colored urine (red or dark brown) or unusual colored stools (red or black).   Unusual bruising for unknown reasons.   A serious fall or if you hit your head (even if there is no bleeding).  Some medicines may interact with Eliquis and might increase your risk of bleeding or clotting while on Eliquis. To help avoid this, consult your healthcare provider or  pharmacist prior to using any new prescription or non-prescription medications, including herbals, vitamins, non-steroidal anti-inflammatory drugs (NSAIDs) and supplements.  This website has more information on Eliquis (apixaban): http://www.eliquis.com/eliquis/home

## 2014-02-03 NOTE — Progress Notes (Signed)
Advanced Heart Failure Rounding Note   Subjective:    Mr. Souder is a 78 yo male with a history of HTN, CAD s/p CABG, ICM s/p ICD, chronic systolic heart failure, DM2, Vtach, atrial flutter, PVD and GI mass.   ECHO (12/22/13) EF 20-25%, mod MR, no evidence of thrombus in LA, mod TR.  Admitted 9/1 with low output HF and worsening renal failure. ECHO: EF 40-45%, RV mild dysfucntion TAPSE 1.34 Initial co-ox 49% and started on milrinone.   Stable off Milrinone. Yesterday hydralazine was increased to 50 mg tid. Weight down 2 pounds.  Denies SOB.   Gastric emptying study --> Normal.    CO-OX 76%   Objective:   Weight Range:  Vital Signs:   Temp:  [97.9 F (36.6 C)-99.3 F (37.4 C)] 98.7 F (37.1 C) (09/11 0715) Pulse Rate:  [88-92] 88 (09/11 0715) BP: (100-151)/(32-67) 108/32 mmHg (09/11 0715) SpO2:  [92 %-99 %] 94 % (09/11 0715) FiO2 (%):  [21 %] 21 % (09/10 1921) Weight:  [151 lb 14.4 oz (68.901 kg)] 151 lb 14.4 oz (68.901 kg) (09/11 0352) Last BM Date: 01/31/14  Weight change: Filed Weights   02/01/14 0300 02/02/14 0300 02/03/14 0352  Weight: 153 lb (69.4 kg) 153 lb 1.6 oz (69.446 kg) 151 lb 14.4 oz (68.901 kg)    Intake/Output:   Intake/Output Summary (Last 24 hours) at 02/03/14 0807 Last data filed at 02/03/14 0500  Gross per 24 hour  Intake    560 ml  Output   1165 ml  Net   -605 ml     Physical Exam: General:  Lying in bed.  No resp difficulty HEENT: normal Neck: supple. JVP 5-6  RIJ 3L CL. Carotids 2+ bilat; no bruits. No lymphadenopathy or thryomegaly appreciated. Cor: PMI nondisplaced. Regular rate & rhythm. No rubs, gallops or murmurs. Lungs: clear Abdomen: soft, nontender, mildly distended. No hepatosplenomegaly. No bruits or masses. Good bowel sounds. Extremities: no cyanosis, clubbing, rash, no edema Neuro: alert & orientedx3, cranial nerves grossly intact. moves all 4 extremities w/o difficulty. Affect pleasant  Telemetry: NSR 60s  Labs: Basic  Metabolic Panel:  Recent Labs Lab 01/30/14 0500 01/31/14 0330 02/01/14 0430 02/02/14 0400 02/03/14 0430  NA 132* 132* 133* 133* 136*  K 4.1 3.8 4.0 3.7 3.9  CL 93* 92* 93* 93* 96  CO2 29 29 28 29 28   GLUCOSE 122* 145* 148* 199* 121*  BUN 23 23 22  26* 23  CREATININE 1.56* 1.48* 1.41* 1.43* 1.42*  CALCIUM 8.6 8.4 8.4 8.2* 8.6    Liver Function Tests: No results found for this basename: AST, ALT, ALKPHOS, BILITOT, PROT, ALBUMIN,  in the last 168 hours No results found for this basename: LIPASE, AMYLASE,  in the last 168 hours No results found for this basename: AMMONIA,  in the last 168 hours  CBC:  Recent Labs Lab 01/31/14 0330 02/03/14 0430  WBC 10.1 9.6  HGB 11.0* 11.0*  HCT 32.0* 32.1*  MCV 85.6 86.3  PLT 226 214    Cardiac Enzymes: No results found for this basename: CKTOTAL, CKMB, CKMBINDEX, TROPONINI,  in the last 168 hours  BNP: BNP (last 3 results)  Recent Labs  08/31/13 1322 01/24/14 1909  PROBNP 230.0* 6721.0*     Other results:  EKG:   Imaging: Nm Gastric Emptying  02/02/2014   CLINICAL DATA:  Persistent nausea  EXAM: NUCLEAR MEDICINE GASTRIC EMPTYING SCAN  TECHNIQUE: After oral ingestion of radiolabeled meal, sequential abdominal images were obtained for 120 minutes.  Residual percentage of activity remaining within the stomach was calculated at 60 and 120 minutes.  RADIOPHARMACEUTICALS:  2 mCi of Technetium 99-m labeled sulfur colloid  COMPARISON:  None.  FINDINGS: Expected location of the stomach in the left upper quadrant. Ingested meal empties the stomach gradually over the course of the study with 62% retention at 60 min and 2% retention at 120 min (normal retention less than 30% at a 120 min).  IMPRESSION: Normal gastric emptying study.   Electronically Signed   By: Franchot Gallo M.D.   On: 02/02/2014 09:33     Medications:     Scheduled Medications: . amiodarone  200 mg Oral BID  . apixaban  5 mg Oral BID  . atorvastatin  40 mg Oral  QHS  . furosemide  40 mg Oral Daily  . hydrALAZINE  50 mg Oral 3 times per day  . insulin aspart  0-15 Units Subcutaneous TID WC  . isosorbide mononitrate  30 mg Oral Daily  . mexiletine  150 mg Oral Q12H  . mometasone-formoterol  2 puff Inhalation BID  . sodium chloride  3 mL Intravenous Q12H  . tiotropium  18 mcg Inhalation Daily    Infusions: . sodium chloride Stopped (02/01/14 0910)    PRN Medications: sodium chloride, acetaminophen, ondansetron (ZOFRAN) IV, polyethylene glycol, sodium chloride   Assessment:   1) A/C systolic HF - EF 58-85% 2) Cardiorenal syndrome 3) Atrial flutter - on apixaban 4) AKI on CKD stage IIII - baseline Cr 2.1-2.3 5) CAD - s/p CABG 6) Sigmoid colon thickening/stricture (never biopsied) 7) Cardiogenic shock.  8) hyponatremia 9) Nausea ? Low output versus stricture   Plan/Discussion:    Mr. Danzy was admitted 9/1 with low output and co-ox, 49%. He was started on milrinone 0.25 mcg and IV lasix for massive volume overload.   Stable off milrinone. CO-OX 76.  Volume status stable. Overall he diuresed 20 pounds. Continue lasix 40 mg daily. Continue hydralazine to 50 mg/imdur 30 mg daily. Has not been on ace/spiro due to CKD.   Home today. Has follow in HF clinic next week.   CLEGG,AMY NP-C  8:07 AM  Patient seen and examined with Darrick Grinder, NP. We discussed all aspects of the encounter. I agree with the assessment and plan as stated above.   He is much improved. Can go home today on current regimen. No b-blocker or ACE due to low output and renal failure. Continue hydralazine/nitrates. Will follow closely in HF Clinic. If decompensates will need milrinone.   Quillian Quince Bensim[place],MD 12:23 PM

## 2014-02-03 NOTE — Discharge Summary (Signed)
Advanced Heart Failure Team  Discharge Summary   Patient ID: [name] MRN: [id number], DOB/AGE: [birth year] 78 y.o. Admit date: 01/24/2014 D/C date:     02/03/2014   Primary Discharge Diagnoses:  1) A/C systolic HF  - EF 70-35%  2) Cardiorenal syndrome  3) Atrial flutter  - on apixaban  -on amiodarone 200 mg twice a day 4) AKI on CKD stage IIII  - baseline Cr 2.1-2.3  5) CAD  - s/p CABG  6) Sigmoid colon thickening/stricture (never biopsied)  7) Cardiogenic shock.  8) hyponatremia  9) Nausea ? Low output versus stricture  Hospital Course:   [name] is a 78 yo male with a history of HTN, CAD s/p CABG, ICM s/p ICD, chronic systolic heart failure, DM2, Vtach, atrial flutter, PVD and GI mass. ECHO (12/22/13) EF 20-25%, mod MR, no evidence of thrombus in LA, mod TR.  [name] was admitted from Dr. Rosezella [place] office with volume overload and low output heart failure. Creatinine was 2.8 on admit (baseline 1.6). Central line was placed to monitor CVP and CO-OX. Initial CO-OX was 49% and [name] was started on Milrinone and IV lasix. [name] enalapril and carvedilol were stopped.  On Milrinone the CO-OX improved to 67%. Once heart failure was optimized Milrinone was weaned off and [name] transitioned to lasix 40 mg daily. Hydralazine and nitrates were started and titrated to hydralazine 50 mg tid/imdur 30 mg daily. Renal function continued to improve and on the day of discharge  Creatinine was 1.42.  [name] was not placed on an ace or spironolactone due to elevated creatinine noted on admit. [name] will not be placed on beta blocker due to low output noted on admit.  Overall [name] diuresed 20 pounds. Discharge weight was 151 pounds.   [name] maintained NSR will continue amiodarone 200 mg twice a day and apixaban 5 mg twice a day. TSH was elevated to 15 will plan to check full thyroid panel when returns to clinic next week. [name] had episodes of nausea while hospitalized that was thought to be from low output. A gastric  emptying study was performed and it was normal.   [name] will continue to be followed closely in the HF clinic with follow up next 9/18 at 10:15 and plan to check TSH, T3, T4, and bmet at that time.  [name] may require advanced therapies in the future will need to follow closely.    Discharge Weight Range: 151 pounds Discharge Vitals: Blood pressure 136/52, pulse 88, temperature 98.7 F (37.1 C), temperature source Oral, resp. rate 13, height 5' 6.93" (1.7 m), weight 151 lb 14.4 oz (68.901 kg), SpO2 94.00%.  Labs: Lab Results  Component Value Date   WBC 9.6 02/03/2014   HGB 11.0* 02/03/2014   HCT 32.1* 02/03/2014   MCV 86.3 02/03/2014   PLT 214 02/03/2014    Recent Labs Lab 02/03/14 0430  NA 136*  K 3.9  CL 96  CO2 28  BUN 23  CREATININE 1.42*  CALCIUM 8.6  GLUCOSE 121*   Lab Results  Component Value Date   CHOL 83 02/01/2014   HDL 10* 02/01/2014   LDLCALC 53 02/01/2014   TRIG 102 02/01/2014   BNP (last 3 results)  Recent Labs  08/31/13 1322 01/24/14 1909  PROBNP 230.0* 6721.0*    Diagnostic Studies/Procedures   Nm Gastric Emptying  02/02/2014   CLINICAL DATA:  Persistent nausea  EXAM: NUCLEAR MEDICINE GASTRIC EMPTYING SCAN  TECHNIQUE: After oral ingestion of radiolabeled meal, sequential abdominal images  were obtained for 120 minutes. Residual percentage of activity remaining within the stomach was calculated at 60 and 120 minutes.  RADIOPHARMACEUTICALS:  2 mCi of Technetium 99-m labeled sulfur colloid  COMPARISON:  None.  FINDINGS: Expected location of the stomach in the left upper quadrant. Ingested meal empties the stomach gradually over the course of the study with 62% retention at 60 min and 2% retention at 120 min (normal retention less than 30% at a 120 min).  IMPRESSION: Normal gastric emptying study.   Electronically Signed   By: Franchot Gallo M.D.   On: 02/02/2014 09:33    Discharge Medications     Medication List    STOP taking these medications       carvedilol  6.25 MG tablet  Commonly known as:  COREG     enalapril 2.5 MG tablet  Commonly known as:  VASOTEC     fenofibrate 160 MG tablet     pioglitazone 15 MG tablet  Commonly known as:  ACTOS      TAKE these medications       amiodarone 200 MG tablet  Commonly known as:  PACERONE  Take 200 mg by mouth 2 (two) times daily.     apixaban 5 MG Tabs tablet  Commonly known as:  ELIQUIS  Take 1 tablet (5 mg total) by mouth 2 (two) times daily.     DiphenhydrAMINE HCl (Sleep) 50 MG Caps  Take 50 mg by mouth at bedtime as needed.     diphenhydramine-acetaminophen 25-500 MG Tabs  Commonly known as:  TYLENOL PM  Take 2 tablets by mouth at bedtime as needed (sleep).     Fluticasone-Salmeterol 500-50 MCG/DOSE Aepb  Commonly known as:  ADVAIR  Inhale 1 puff into the lungs 2 (two) times daily.     furosemide 40 MG tablet  Commonly known as:  LASIX  Take 1 tablet (40 mg total) by mouth daily.     hydrALAZINE 50 MG tablet  Commonly known as:  APRESOLINE  Take 1 tablet (50 mg total) by mouth every 8 (eight) hours.     isosorbide mononitrate 30 MG 24 hr tablet  Commonly known as:  IMDUR  Take 1 tablet (30 mg total) by mouth daily.     mexiletine 150 MG capsule  Commonly known as:  MEXITIL  Take 150 mg by mouth 2 (two) times daily.     MUCINEX MAXIMUM STRENGTH 1200 MG Tb12  Generic drug:  Guaifenesin  Take 1,200 mg by mouth 2 (two) times daily.     pravastatin 80 MG tablet  Commonly known as:  PRAVACHOL  Take 80 mg by mouth daily.     tiotropium 18 MCG inhalation capsule  Commonly known as:  SPIRIVA  Place 18 mcg into inhaler and inhale every evening. Between 4 & 6 pm        Disposition   The patient will be discharged in stable condition to home.     Discharge Instructions   Contraindication to ACEI at discharge    Complete by:  As directed      Diet - low sodium heart healthy    Complete by:  As directed      Increase activity slowly    Complete by:  As directed            Follow-up Information   Follow up with Glori Bickers, MD On 02/10/2014. (at 10:15 [place])    Specialty:  Cardiology   Contact information:  [place] [address] [phone]         Duration of Discharge Encounter: Greater than 35 minutes   Signed,  CLEGG,AMY  NP-C  02/03/2014, 11:35 AM  Patient seen and examined with Darrick Grinder, NP. We discussed all aspects of the encounter. I agree with the assessment and plan as stated above.   [name] is much improved. Can go home today on current regimen. No b-blocker or ACE at this time due to low output and renal failure. Continue hydralazine/nitrates. Will follow closely in HF Clinic. If decompensates will need milrinone and possibly VAD.   Quillian Quince [place],MD 12:23 PM

## 2014-02-04 ENCOUNTER — Other Ambulatory Visit: Payer: Self-pay | Admitting: Pulmonary Disease

## 2014-02-06 ENCOUNTER — Telehealth (HOSPITAL_COMMUNITY): Payer: Self-pay | Admitting: Vascular Surgery

## 2014-02-06 NOTE — Telephone Encounter (Signed)
He having pain in his hips and he cant use his hands... Please advise

## 2014-02-06 NOTE — Telephone Encounter (Signed)
pts wife called with reports of hip pain- radiates into lower buttocks and bilateral hand pain/tremors Unable to eat food or sign checks  Denies, chest pain SOB Dizziness  Pt feels this is related to eliquis  chart reviewed with Darrick Grinder, NP eliqis was not a new medication that was started during the course of hospitalization, this was a med he came in on  Advised pt should report to ER for further evaluation  Pt does not wish to be seen in ER. States he can live with the pains Outpatient appt moved to next available advised pt should report to ER if symptoms get worse

## 2014-02-07 ENCOUNTER — Telehealth (HOSPITAL_COMMUNITY): Payer: Self-pay | Admitting: Vascular Surgery

## 2014-02-07 ENCOUNTER — Other Ambulatory Visit: Payer: Self-pay | Admitting: Family Medicine

## 2014-02-07 NOTE — Telephone Encounter (Signed)
Patient request glyburide but once I told him it wasn't on his medication list he realized he was taken off of the medication.

## 2014-02-09 ENCOUNTER — Ambulatory Visit (HOSPITAL_COMMUNITY)
Admission: RE | Admit: 2014-02-09 | Discharge: 2014-02-09 | Disposition: A | Discharge status: Home / Self Care | Payer: Medicare Other | Source: Ambulatory Visit | Attending: Internal Medicine | Admitting: Internal Medicine

## 2014-02-09 VITALS — BP 118/54 | HR 99 | Wt 151.5 lb

## 2014-02-09 DIAGNOSIS — J441 Chronic obstructive pulmonary disease with (acute) exacerbation: Secondary | ICD-10-CM | POA: Diagnosis not present

## 2014-02-09 DIAGNOSIS — I2589 Other forms of chronic ischemic heart disease: Secondary | ICD-10-CM | POA: Diagnosis not present

## 2014-02-09 DIAGNOSIS — I129 Hypertensive chronic kidney disease with stage 1 through stage 4 chronic kidney disease, or unspecified chronic kidney disease: Secondary | ICD-10-CM | POA: Insufficient documentation

## 2014-02-09 DIAGNOSIS — Z79899 Other long term (current) drug therapy: Secondary | ICD-10-CM | POA: Diagnosis not present

## 2014-02-09 DIAGNOSIS — M19049 Primary osteoarthritis, unspecified hand: Secondary | ICD-10-CM | POA: Insufficient documentation

## 2014-02-09 DIAGNOSIS — J440 Chronic obstructive pulmonary disease with acute lower respiratory infection: Secondary | ICD-10-CM | POA: Insufficient documentation

## 2014-02-09 DIAGNOSIS — M545 Low back pain, unspecified: Secondary | ICD-10-CM | POA: Diagnosis not present

## 2014-02-09 DIAGNOSIS — K319 Disease of stomach and duodenum, unspecified: Secondary | ICD-10-CM | POA: Insufficient documentation

## 2014-02-09 DIAGNOSIS — I4892 Unspecified atrial flutter: Secondary | ICD-10-CM | POA: Diagnosis not present

## 2014-02-09 DIAGNOSIS — Z951 Presence of aortocoronary bypass graft: Secondary | ICD-10-CM | POA: Diagnosis not present

## 2014-02-09 DIAGNOSIS — F411 Generalized anxiety disorder: Secondary | ICD-10-CM | POA: Diagnosis not present

## 2014-02-09 DIAGNOSIS — E119 Type 2 diabetes mellitus without complications: Secondary | ICD-10-CM | POA: Diagnosis not present

## 2014-02-09 DIAGNOSIS — T82897A Other specified complication of cardiac prosthetic devices, implants and grafts, initial encounter: Secondary | ICD-10-CM | POA: Insufficient documentation

## 2014-02-09 DIAGNOSIS — Z7901 Long term (current) use of anticoagulants: Secondary | ICD-10-CM | POA: Insufficient documentation

## 2014-02-09 DIAGNOSIS — I2510 Atherosclerotic heart disease of native coronary artery without angina pectoris: Secondary | ICD-10-CM | POA: Diagnosis not present

## 2014-02-09 DIAGNOSIS — Z95810 Presence of automatic (implantable) cardiac defibrillator: Secondary | ICD-10-CM | POA: Insufficient documentation

## 2014-02-09 DIAGNOSIS — I480 Paroxysmal atrial fibrillation: Secondary | ICD-10-CM

## 2014-02-09 DIAGNOSIS — I509 Heart failure, unspecified: Secondary | ICD-10-CM | POA: Insufficient documentation

## 2014-02-09 DIAGNOSIS — K5730 Diverticulosis of large intestine without perforation or abscess without bleeding: Secondary | ICD-10-CM | POA: Diagnosis not present

## 2014-02-09 DIAGNOSIS — I779 Disorder of arteries and arterioles, unspecified: Secondary | ICD-10-CM | POA: Insufficient documentation

## 2014-02-09 DIAGNOSIS — I749 Embolism and thrombosis of unspecified artery: Secondary | ICD-10-CM | POA: Insufficient documentation

## 2014-02-09 DIAGNOSIS — I472 Ventricular tachycardia, unspecified: Secondary | ICD-10-CM | POA: Insufficient documentation

## 2014-02-09 DIAGNOSIS — G8929 Other chronic pain: Secondary | ICD-10-CM | POA: Diagnosis not present

## 2014-02-09 DIAGNOSIS — I4891 Unspecified atrial fibrillation: Secondary | ICD-10-CM | POA: Insufficient documentation

## 2014-02-09 DIAGNOSIS — N183 Chronic kidney disease, stage 3 unspecified: Secondary | ICD-10-CM | POA: Insufficient documentation

## 2014-02-09 DIAGNOSIS — I5023 Acute on chronic systolic (congestive) heart failure: Secondary | ICD-10-CM

## 2014-02-09 DIAGNOSIS — E7800 Pure hypercholesterolemia, unspecified: Secondary | ICD-10-CM | POA: Insufficient documentation

## 2014-02-09 DIAGNOSIS — I5022 Chronic systolic (congestive) heart failure: Secondary | ICD-10-CM

## 2014-02-09 DIAGNOSIS — R42 Dizziness and giddiness: Secondary | ICD-10-CM | POA: Insufficient documentation

## 2014-02-09 DIAGNOSIS — I4729 Other ventricular tachycardia: Secondary | ICD-10-CM | POA: Diagnosis not present

## 2014-02-09 DIAGNOSIS — I739 Peripheral vascular disease, unspecified: Secondary | ICD-10-CM | POA: Insufficient documentation

## 2014-02-09 DIAGNOSIS — J209 Acute bronchitis, unspecified: Secondary | ICD-10-CM | POA: Insufficient documentation

## 2014-02-09 DIAGNOSIS — L219 Seborrheic dermatitis, unspecified: Secondary | ICD-10-CM | POA: Diagnosis not present

## 2014-02-09 DIAGNOSIS — J45901 Unspecified asthma with (acute) exacerbation: Secondary | ICD-10-CM

## 2014-02-09 LAB — BASIC METABOLIC PANEL
ANION GAP: 16 — AB (ref 5–15)
BUN: 18 mg/dL (ref 6–23)
CALCIUM: 8.9 mg/dL (ref 8.4–10.5)
CO2: 26 mEq/L (ref 19–32)
CREATININE: 1.38 mg/dL — AB (ref 0.50–1.35)
Chloride: 94 mEq/L — ABNORMAL LOW (ref 96–112)
GFR calc Af Amer: 56 mL/min — ABNORMAL LOW (ref >90)
GFR calc non Af Amer: 48 mL/min — ABNORMAL LOW (ref >90)
Glucose, Bld: 230 mg/dL — ABNORMAL HIGH (ref 70–99)
Potassium: 3.9 mEq/L (ref 3.7–5.3)
Sodium: 136 mEq/L — ABNORMAL LOW (ref 137–147)

## 2014-02-09 LAB — PRO B NATRIURETIC PEPTIDE: PRO B NATRI PEPTIDE: 2555 pg/mL — AB (ref 0–450)

## 2014-02-09 MED ORDER — FUROSEMIDE 40 MG PO TABS: ORAL_TABLET | ORAL | Status: DC

## 2014-02-09 MED ORDER — AMIODARONE HCL 200 MG PO TABS: 200.0000 mg | ORAL_TABLET | Freq: Every day | ORAL | Status: DC

## 2014-02-09 NOTE — Progress Notes (Signed)
Patient ID: [name], male   DOB: [birth year], 78 y.o.   MRN: [id number]  PCP: Dr Ernestine Conrad.   HPI:  [name] is a 78 yo male with a history of HTN, CAD s/p CABG, ICM s/pMedtronic ICD, chronic systolic heart failure, DM2, Vtach, atrial flutter, PVD and GI mass. ECHO (12/22/13) EF 20-25%, mod [name], no evidence of thrombus in LA, mod TR.   On September 1st he was admitted from Dr. Rosezella [place] office with volume overload and low output heart failure. Creatinine was 2.8 on admit (baseline 1.6). Central line was placed to monitor CVP and CO-OX. Initial CO-OX was 49% and he was started on Milrinone and IV lasix. His enalapril and carvedilol were stopped. On Milrinone the CO-OX improved to 67%. Once heart failure was optimized Milrinone was weaned off and he transitioned to lasix 40 mg daily. Hydralazine and nitrates were started and titrated to hydralazine 50 mg tid/imdur 30 mg daily. Renal function continued to improve and on the day of discharge Creatinine was 1.42. He was not placed on an ace or spironolactone due to elevated creatinine noted on admit. He will not be placed on beta blocker due to low output noted on admit. Overall he diuresed 20 pounds. Discharge weight was 151 pounds. He also continued on amiodarone 200 mg twice a day with apixaban 5 mg twice a day.   He returns for post hospital follow up. Complaining hand tremors and mild hip pain.  Mild dyspnea with exertion. Denies PND/Orthopnea/dizziness.  Does admit to bendopnea. Mild dizziness standing. Poor appetite. No bleeding problems. Weight at home trending down from 151 to 146 pounds. Following low salt diet and drinking < 2 liters per day.   Optivol : Fluid well below threshold. Activity a little over 2 hours.  Labs 02/09/14 K 3.9 Creatinine 1.38 Pro BNP 2555   ROS: All systems negative except as listed in HPI, PMH and Problem List.  Past Medical History  Diagnosis Date  . VENTRICULAR TACHYCARDIA   . PERIPHERAL VASCULAR DISEASE    . HYPERTENSION   . HYPERCHOLESTEROLEMIA   . CORONARY ARTERY DISEASE   . Chronic systolic heart failure   . CAROTID ARTERY DISEASE   . SEBORRHEIC DERMATITIS   . RENAL INSUFFICIENCY   . DIVERTICULOSIS OF COLON   . COPD   . COLONIC POLYPS   . ASTHMATIC BRONCHITIS, ACUTE   . ANXIETY   . ICD (implantable cardiac defibrillator) in place   . Chronic bronchitis     "every year" (05/24/2012)  . Type II diabetes mellitus   . Chronic lower back pain   . DJD (degenerative joint disease)     "hands" (05/24/2012)  . Osteoarthritis of finger   . History of atrial flutter   . Thrombus 12/2013    on ICD lead--started anticoag and his cardioversion for atrial flutter was postponed.    Current Outpatient Prescriptions  Medication Sig Dispense Refill  . amiodarone (PACERONE) 200 MG tablet Take 200 mg by mouth 2 (two) times daily.      Marland Kitchen apixaban (ELIQUIS) 5 MG TABS tablet Take 1 tablet (5 mg total) by mouth 2 (two) times daily.  60 tablet  6  . DiphenhydrAMINE HCl, Sleep, 50 MG CAPS Take 50 mg by mouth at bedtime as needed.      . diphenhydramine-acetaminophen (TYLENOL PM) 25-500 MG TABS Take 2 tablets by mouth at bedtime as needed (sleep).       . Fluticasone-Salmeterol (ADVAIR) 500-50 MCG/DOSE AEPB Inhale 1 puff into  the lungs 2 (two) times daily.      . furosemide (LASIX) 40 MG tablet Take 1 tablet (40 mg total) by mouth daily.  60 tablet  6  . Guaifenesin (MUCINEX MAXIMUM STRENGTH) 1200 MG TB12 Take 1,200 mg by mouth 2 (two) times daily.       . hydrALAZINE (APRESOLINE) 50 MG tablet Take 1 tablet (50 mg total) by mouth every 8 (eight) hours.  90 tablet  6  . isosorbide mononitrate (IMDUR) 30 MG 24 hr tablet Take 1 tablet (30 mg total) by mouth daily.  30 tablet  6  . mexiletine (MEXITIL) 150 MG capsule Take 150 mg by mouth 2 (two) times daily.      . pravastatin (PRAVACHOL) 80 MG tablet Take 80 mg by mouth daily.      Marland Kitchen tiotropium (SPIRIVA) 18 MCG inhalation capsule Place 18 mcg into inhaler  and inhale every evening. Between 4 & 6 pm       No current facility-administered medications for this encounter.     PHYSICAL EXAM: Filed Vitals:   02/09/14 0922  BP: 118/54  Pulse: 99  Weight: 151 lb 8 oz (68.72 kg)  SpO2: 93%    General:  Well appearing. No resp difficulty HEENT: normal Neck: supple. JVP flat. Carotids 2+ bilaterally; no bruits. No lymphadenopathy or thryomegaly appreciated. Cor: PMI normal. Regular rate & rhythm. No rubs, gallops or murmurs. Lungs: clear Abdomen: soft, nontender, nondistended. No hepatosplenomegaly. No bruits or masses. Good bowel sounds. Extremities: no cyanosis, clubbing, rash, edema Neuro: alert & orientedx3, cranial nerves grossly intact. Moves all 4 extremities w/o difficulty. Affect pleasant.   ECG: NSR 93 bpm    ASSESSMENT & PLAN: [name] is a 78 year old with ICM and chronic systolic heart failure recently admitted with cardiogenic shock that required short term milrinone.  1. Chronic  Systolic Heart Failure .  ICM Has Medtronic ICD. ECHO 01/25/14 EF 40-45%  NYHA II-III. Volume status low. Cut back to lasix to 40 mg daily and alternate with 20 mg daily with  instruction to hold lasix if home weight is less than 144 pounds.  Not on BB due to cardiogenic shock. Hopefully can add at next visit Continue hydralazine 50 mg every 8 hours/Imdur 30 mg daily He is not Ace or spiro due to CKD Check BMET and Pro BNP today  2. A fib/Aflutter- Maintaining SR. Due to hand tremors will cut back amiodarone 200 mg daily. Continue eliiquis 5 mg twice a day. TSH elevated in hospital. Check TSH, T3, T4 next visit. Will need yearly eye exams.  3. Hand Tremors-  May be related to amiodarone as above cut back to 200 mg daily.   4. CKD III: check BMET today --->. Creatinine 1.3 today  5. DM: Per PCP. I have asked him schedule follow up.   Follow up in 2 weeks with Dr Valrie Hart. WIll need TSH, T3 T4 at next visit.   CLEGG,AMY NP-C  12:58 PM

## 2014-02-09 NOTE — Patient Instructions (Signed)
Follow up in 2 weeks  Take amiodarone 200 mg daily   Take lasix 40 mg daily and alternate with 20 mg daily  Hold lasix if your weight is less than 144 pounds.   Do the following things EVERYDAY: 1) Weigh yourself in the morning before breakfast. Write it down and keep it in a log. 2) Take your medicines as prescribed 3) Eat low salt foods-Limit salt (sodium) to 2000 mg per day.  4) Stay as active as you can everyday 5) Limit all fluids for the day to less than 2 liters

## 2014-02-09 NOTE — Telephone Encounter (Signed)
Provided with lab results   Renal function stable.   Glucose elevated. I have asked him to contact PCP for follow up. He plans to do so today.    [name] verbalized understanding.   Sarahy Creedon NP-C  11:50 AM

## 2014-02-10 ENCOUNTER — Telehealth: Payer: Self-pay | Admitting: Family Medicine

## 2014-02-10 ENCOUNTER — Inpatient Hospital Stay (HOSPITAL_COMMUNITY): Payer: Medicare Other

## 2014-02-10 ENCOUNTER — Encounter: Payer: Self-pay | Admitting: Family Medicine

## 2014-02-10 ENCOUNTER — Ambulatory Visit (INDEPENDENT_AMBULATORY_CARE_PROVIDER_SITE_OTHER): Payer: Medicare Other | Admitting: Family Medicine

## 2014-02-10 VITALS — BP 137/67 | HR 89 | Temp 97.8°F | Resp 20 | Ht 66.5 in | Wt 151.0 lb

## 2014-02-10 DIAGNOSIS — M545 Low back pain, unspecified: Secondary | ICD-10-CM

## 2014-02-10 DIAGNOSIS — E119 Type 2 diabetes mellitus without complications: Secondary | ICD-10-CM

## 2014-02-10 DIAGNOSIS — N189 Chronic kidney disease, unspecified: Secondary | ICD-10-CM | POA: Insufficient documentation

## 2014-02-10 DIAGNOSIS — I2510 Atherosclerotic heart disease of native coronary artery without angina pectoris: Secondary | ICD-10-CM

## 2014-02-10 DIAGNOSIS — R160 Hepatomegaly, not elsewhere classified: Secondary | ICD-10-CM | POA: Insufficient documentation

## 2014-02-10 DIAGNOSIS — R630 Anorexia: Secondary | ICD-10-CM

## 2014-02-10 DIAGNOSIS — I4892 Unspecified atrial flutter: Secondary | ICD-10-CM

## 2014-02-10 DIAGNOSIS — N259 Disorder resulting from impaired renal tubular function, unspecified: Secondary | ICD-10-CM

## 2014-02-10 DIAGNOSIS — I5022 Chronic systolic (congestive) heart failure: Secondary | ICD-10-CM

## 2014-02-10 DIAGNOSIS — Z23 Encounter for immunization: Secondary | ICD-10-CM

## 2014-02-10 MED ORDER — INSULIN GLARGINE 100 UNIT/ML SOLOSTAR PEN: PEN_INJECTOR | SUBCUTANEOUS | Status: AC

## 2014-02-10 NOTE — Progress Notes (Signed)
OFFICE VISIT  02/12/2014   CC:  Chief Complaint  Patient presents with  . Diabetes  . Back Pain   HPI:    Patient is a 78 y.o. Caucasian male who presents for 1 mo f/u DM 2. Hospitalized again recently 9/1-9/11 for low output HF--was stabilized.  Has been kept off ACE-I and aldactone due to creatinine still being up.  Post hospital f/u with cardiology 02/09/14 showed him to be stable but a little volume depleted so they decreased his diuretic (lasix 40 qd alt w/20 qd).  He is still not back on beta blocker due to recent hx of cardiogenic shock. A repeat transthoracic echo showed EF 40-45% with diffuse hypokinesis, and akinesis of basal-inferior myocardium, PA pressure a bit elevated at 47.  He is still on eliquis for his PAF/atrial flutter and history of suspected thrombus on ICD lead.  Recent glucose on BMET was 230.  We had gotten him off of all diabetic meds b/c of hypoglycemic episodes + recent A1c excellent.  Reviewed glucometer readings today: avg fasting 140, avg 2H PP lunch 170-200, hs >200.    Still with problem of chronic poor appetite.  He did not get the abd u/s I ordered when I saw him a month ago-unclear why. Reviewed Dr. Jeannine Kitten most recent f/u notes from 12/2013 on his overal GI situation: "hx adenomatous polyp in 1997, last colonoscopy 2002 by [facility] w/ divertics & sigmoid narrowing; he saw [facility] 7/14- declined colon, did virtual CT colon showing stricture of the rectosigmoid w/ asymmetrical thickening worrisome for sigmoid colon carcinoma, 5 mm polyp at 8-10 cm from the anal verge, diverticulosis as well with multiple diverticula within the rectosigmoid colon; [facility] rec surg but pre-op evals showed high risk & pt opted for watchful waiting; He has regular soft BMs w/ metamucil rx, no blood, not anemic, denies abn pain, but CEA is rising 5.3=>8.0 (4/15)..."  Lab Results  Component Value Date   HGBA1C 5.7 01/11/2014     Past Medical History  Diagnosis Date  .  VENTRICULAR TACHYCARDIA   . PERIPHERAL VASCULAR DISEASE   . HYPERTENSION   . HYPERCHOLESTEROLEMIA   . CORONARY ARTERY DISEASE   . Chronic systolic heart failure   . CAROTID ARTERY DISEASE   . SEBORRHEIC DERMATITIS   . RENAL INSUFFICIENCY   . DIVERTICULOSIS OF COLON   . COPD   . COLONIC POLYPS   . ANXIETY   . ICD (implantable cardiac defibrillator) in place   . Chronic bronchitis     "every year" (05/24/2012)  . Type II diabetes mellitus   . Chronic lower back pain   . DJD (degenerative joint disease)     "hands" (05/24/2012)  . Osteoarthritis of finger   . History of atrial flutter   . Thrombus 12/2013    on ICD lead--started anticoag and his cardioversion for atrial flutter was postponed.    Past Surgical History  Procedure Laterality Date  . Coronary artery bypass graft  1990's    CABG X4  . Tonsillectomy  ?1942  . Cardiac defibrillator placement  12/2004    single chamber defibrillator- Medtronic Maxima 8/06 DrKlein [Other][  . Tee without cardioversion N/A 12/22/2013    Procedure: TRANSESOPHAGEAL ECHOCARDIOGRAM (TEE);  Surgeon: Thayer Headings, MD;  Location: [place];  Service: Cardiovascular;  Laterality: N/A;  . Cardioversion N/A 12/22/2013    Procedure: CARDIOVERSION;  Surgeon: Thayer Headings, MD;  Location: [place];  Service: Cardiovascular;  Laterality: N/A;  . Cardiovascular stress test  01/12/13    myocard perf imaging: previous infarct with a moderately reduced EF as was known previously.  No new findings.   . Gastric emptying scan  02/02/14    Normal  . Transthoracic echocardiogram  01/25/14    EF 40-45%, diffuse hypokinesis, infero-basilar myocardial akinesis    Outpatient Prescriptions Prior to Visit  Medication Sig Dispense Refill  . amiodarone (PACERONE) 200 MG tablet Take 1 tablet (200 mg total) by mouth daily.  30 tablet  6  . apixaban (ELIQUIS) 5 MG TABS tablet Take 1 tablet (5 mg total) by mouth 2 (two) times daily.  60 tablet  6  .  DiphenhydrAMINE HCl, Sleep, 50 MG CAPS Take 50 mg by mouth at bedtime as needed.      . Fluticasone-Salmeterol (ADVAIR) 500-50 MCG/DOSE AEPB Inhale 1 puff into the lungs 2 (two) times daily.      . furosemide (LASIX) 40 MG tablet Take 40 mg and alternate with 20 mg daily  60 tablet  6  . Guaifenesin (MUCINEX MAXIMUM STRENGTH) 1200 MG TB12 Take 1,200 mg by mouth 2 (two) times daily.       . hydrALAZINE (APRESOLINE) 50 MG tablet Take 1 tablet (50 mg total) by mouth every 8 (eight) hours.  90 tablet  6  . isosorbide mononitrate (IMDUR) 30 MG 24 hr tablet Take 1 tablet (30 mg total) by mouth daily.  30 tablet  6  . mexiletine (MEXITIL) 150 MG capsule Take 150 mg by mouth 2 (two) times daily.      . pravastatin (PRAVACHOL) 80 MG tablet Take 80 mg by mouth daily.      Marland Kitchen tiotropium (SPIRIVA) 18 MCG inhalation capsule Place 18 mcg into inhaler and inhale every evening. Between 4 & 6 pm      . diphenhydramine-acetaminophen (TYLENOL PM) 25-500 MG TABS Take 2 tablets by mouth at bedtime as needed (sleep).        No facility-administered medications prior to visit.    Allergies  Allergen Reactions  . Niacin Itching    Niaspan     ROS As per HPI  PE: Blood pressure 137/67, pulse 89, temperature 97.8 F (36.6 C), temperature source Temporal, resp. rate 20, height 5' 6.5" (1.689 m), weight 151 lb (68.493 kg), SpO2 89.00%. Gen: Alert, well appearing.  Patient is oriented to person, place, time, and situation. AFFECT: pleasant, lucid thought and speech. No further exam today.  LABS:    Chemistry      Component Value Date/Time   NA 136* 02/09/2014 1012   K 3.9 02/09/2014 1012   CL 94* 02/09/2014 1012   CO2 26 02/09/2014 1012   BUN 18 02/09/2014 1012   CREATININE 1.38* 02/09/2014 1012      Component Value Date/Time   CALCIUM 8.9 02/09/2014 1012   ALKPHOS 80 01/24/2014 1909   AST 67* 01/24/2014 1909   ALT 20 01/24/2014 1909   BILITOT 2.4* 01/24/2014 1909     Lab Results  Component Value Date   WBC  9.6 02/03/2014   HGB 11.0* 02/03/2014   HCT 32.1* 02/03/2014   MCV 86.3 02/03/2014   PLT 214 02/03/2014   Lab Results  Component Value Date   HGBA1C 5.7 01/11/2014    IMPRESSION AND PLAN:  DIABETES MELLITUS With stage III CRI: we are out of options orally, so must start insulin. Start lantus 10 U qhs. Continue tid qlucose checks and bring back for review.  Anorexia With his history of abnl CT showing colonic stricture  and associated rising CEA level, colon cancer is a distinct possibility as a cause of this. He was too high of surgical risk when surgery evaluated him for this recently. Will look into how to further look into this problem: comb through old notes, imaging, labs--and see if we can try to get a better answer about this w/out doing surgery.  CHRONIC SYSTOLIC HEART FAILURE Stable, continue current meds + cardiology/CHF clinic follow up.  Atrial flutter Amio, mexiletine,, eliquis--has been in sinus rhythm since hospitalization.. Not quite sure, but ablation procedure that was once being considered may have to be permanently abandoned as a possible therapy.  Low back pain Chronic, mild.  We didn't have adequate time to devote to discussion or w/u of this today but hopefully will in next visit. He says he will try tylenol 1000 mg tid prn.  He says he has never had this problem worked up in the past.  RENAL INSUFFICIENCY Stage III--Cr back at baseline. Renal u/s in recent hospitalization showed mild cortical thinning but no mass or obstruction.   An After Visit Summary was printed and given to the patient.  FOLLOW UP: Return for f/u 10-12 d to recheck DM and lack of appetite (?back pain).

## 2014-02-10 NOTE — Assessment & Plan Note (Signed)
Check abd u/s. He has unexplained anorexia.  Will try to get old GI records (pt gives history of a work-up being done and a surgical consultation being done as well--although surgery was not done) to try to get clarity on past hx of this problem.

## 2014-02-10 NOTE — Patient Instructions (Signed)
Continue to check your glucose fasting every morning, again 2 hours after lunch, and again before you go to bed.

## 2014-02-10 NOTE — Assessment & Plan Note (Addendum)
Check lytes/cr today.

## 2014-02-10 NOTE — Telephone Encounter (Signed)
Caller name: Ronrico Relation to pt: self Call back number: [phone] Pharmacy:  Reason for call:   Patient has questions regarding the directions of the insulin

## 2014-02-10 NOTE — Assessment & Plan Note (Addendum)
Hypoglycemic episodes lately.  Check HbA1c today. Will d/c glyburide/metformin. Given renal insufficiency we have limited choices for oral diabetic med treatment.  Will start trial of actos 15mg  qd, continue home gluc monitoring.

## 2014-02-10 NOTE — Addendum Note (Signed)
Encounter addended by: Georga Kaufmann, CCT on: 02/10/2014  8:27 AM<BR>     Documentation filed: Charges VN

## 2014-02-10 NOTE — Telephone Encounter (Signed)
Patient calling to ask directions of insulin again.  Verified sig w/ patient.  Pt voiced understanding.

## 2014-02-10 NOTE — Progress Notes (Signed)
Pre visit review using our clinic review tool, if applicable. No additional management support is needed unless otherwise documented below in the visit note. 

## 2014-02-11 ENCOUNTER — Other Ambulatory Visit: Payer: Self-pay | Admitting: Internal Medicine

## 2014-02-12 ENCOUNTER — Encounter: Payer: Self-pay | Admitting: Family Medicine

## 2014-02-12 DIAGNOSIS — M545 Low back pain, unspecified: Secondary | ICD-10-CM | POA: Insufficient documentation

## 2014-02-12 DIAGNOSIS — R630 Anorexia: Secondary | ICD-10-CM | POA: Insufficient documentation

## 2014-02-12 NOTE — Assessment & Plan Note (Signed)
Stage III--Cr back at baseline. Renal u/s in recent hospitalization showed mild cortical thinning but no mass or obstruction.

## 2014-02-12 NOTE — Assessment & Plan Note (Addendum)
Amio, mexiletine,, eliquis--has been in sinus rhythm since hospitalization.. Not quite sure, but ablation procedure that was once being considered may have to be permanently abandoned as a possible therapy.

## 2014-02-12 NOTE — Assessment & Plan Note (Signed)
With his history of abnl CT showing colonic stricture and associated rising CEA level, colon cancer is a distinct possibility as a cause of this. He was too high of surgical risk when surgery evaluated him for this recently. Will look into how to further look into this problem: comb through old notes, imaging, labs--and see if we can try to get a better answer about this w/out doing surgery.

## 2014-02-12 NOTE — Assessment & Plan Note (Signed)
Chronic, mild.  We didn't have adequate time to devote to discussion or w/u of this today but hopefully will in next visit. He says he will try tylenol 1000 mg tid prn.  He says he has never had this problem worked up in the past.

## 2014-02-12 NOTE — Assessment & Plan Note (Signed)
With stage III CRI: we are out of options orally, so must start insulin. Start lantus 10 U qhs. Continue tid qlucose checks and bring back for review.

## 2014-02-12 NOTE — Assessment & Plan Note (Signed)
Stable, continue current meds + cardiology/CHF clinic follow up.

## 2014-02-14 ENCOUNTER — Ambulatory Visit (HOSPITAL_BASED_OUTPATIENT_CLINIC_OR_DEPARTMENT_OTHER)
Admission: RE | Admit: 2014-02-14 | Discharge: 2014-02-14 | Disposition: A | Discharge status: Home / Self Care | Payer: Medicare Other | Source: Ambulatory Visit | Attending: Family Medicine | Admitting: Family Medicine

## 2014-02-14 DIAGNOSIS — R110 Nausea: Secondary | ICD-10-CM | POA: Insufficient documentation

## 2014-02-14 DIAGNOSIS — Z951 Presence of aortocoronary bypass graft: Secondary | ICD-10-CM | POA: Diagnosis not present

## 2014-02-14 DIAGNOSIS — E119 Type 2 diabetes mellitus without complications: Secondary | ICD-10-CM | POA: Diagnosis not present

## 2014-02-14 DIAGNOSIS — K838 Other specified diseases of biliary tract: Secondary | ICD-10-CM | POA: Insufficient documentation

## 2014-02-14 DIAGNOSIS — I5022 Chronic systolic (congestive) heart failure: Secondary | ICD-10-CM | POA: Diagnosis not present

## 2014-02-14 DIAGNOSIS — I129 Hypertensive chronic kidney disease with stage 1 through stage 4 chronic kidney disease, or unspecified chronic kidney disease: Secondary | ICD-10-CM | POA: Insufficient documentation

## 2014-02-14 DIAGNOSIS — R160 Hepatomegaly, not elsewhere classified: Secondary | ICD-10-CM

## 2014-02-14 DIAGNOSIS — N189 Chronic kidney disease, unspecified: Secondary | ICD-10-CM | POA: Insufficient documentation

## 2014-02-14 DIAGNOSIS — N183 Chronic kidney disease, stage 3 unspecified: Secondary | ICD-10-CM

## 2014-02-14 DIAGNOSIS — I2510 Atherosclerotic heart disease of native coronary artery without angina pectoris: Secondary | ICD-10-CM | POA: Insufficient documentation

## 2014-02-16 ENCOUNTER — Encounter: Payer: Self-pay | Admitting: Internal Medicine

## 2014-02-17 ENCOUNTER — Telehealth: Payer: Self-pay | Admitting: Family Medicine

## 2014-02-17 MED ORDER — TEMAZEPAM 7.5 MG PO CAPS: ORAL_CAPSULE | ORAL | Status: DC

## 2014-02-17 NOTE — Telephone Encounter (Signed)
Called Rx into [facility] for pt.

## 2014-02-17 NOTE — Telephone Encounter (Signed)
LMOM for pt to CB.  

## 2014-02-17 NOTE — Telephone Encounter (Signed)
Patient is having trouble sleeping for the last four days.  States he hasn't slept at all.  He usually takes a benadryl QHS but he says it isn't helping him.  Please advise if there is something else you can Rx for him?

## 2014-02-17 NOTE — Telephone Encounter (Signed)
restoril rx printed

## 2014-02-20 ENCOUNTER — Ambulatory Visit (INDEPENDENT_AMBULATORY_CARE_PROVIDER_SITE_OTHER): Payer: Medicare Other | Admitting: Family Medicine

## 2014-02-20 ENCOUNTER — Encounter: Payer: Self-pay | Admitting: Family Medicine

## 2014-02-20 VITALS — BP 118/62 | HR 93 | Temp 98.1°F | Ht 66.5 in | Wt 150.8 lb

## 2014-02-20 DIAGNOSIS — K6389 Other specified diseases of intestine: Secondary | ICD-10-CM

## 2014-02-20 DIAGNOSIS — K56699 Other intestinal obstruction unspecified as to partial versus complete obstruction: Secondary | ICD-10-CM

## 2014-02-20 DIAGNOSIS — R630 Anorexia: Secondary | ICD-10-CM

## 2014-02-20 DIAGNOSIS — K56609 Unspecified intestinal obstruction, unspecified as to partial versus complete obstruction: Secondary | ICD-10-CM

## 2014-02-20 DIAGNOSIS — I2510 Atherosclerotic heart disease of native coronary artery without angina pectoris: Secondary | ICD-10-CM

## 2014-02-20 LAB — CBC WITH DIFFERENTIAL/PLATELET
BASOS PCT: 0.7 % (ref 0.0–3.0)
Basophils Absolute: 0.1 10*3/uL (ref 0.0–0.1)
EOS PCT: 6.1 % — AB (ref 0.0–5.0)
Eosinophils Absolute: 0.8 10*3/uL — ABNORMAL HIGH (ref 0.0–0.7)
HCT: 36.1 % — ABNORMAL LOW (ref 39.0–52.0)
HEMOGLOBIN: 12.1 g/dL — AB (ref 13.0–17.0)
LYMPHS PCT: 9.2 % — AB (ref 12.0–46.0)
Lymphs Abs: 1.2 10*3/uL (ref 0.7–4.0)
MCHC: 33.3 g/dL (ref 30.0–36.0)
MCV: 90.5 fl (ref 78.0–100.0)
Monocytes Absolute: 1.3 10*3/uL — ABNORMAL HIGH (ref 0.1–1.0)
Monocytes Relative: 9.9 % (ref 3.0–12.0)
NEUTROS PCT: 74.1 % (ref 43.0–77.0)
Neutro Abs: 9.8 10*3/uL — ABNORMAL HIGH (ref 1.4–7.7)
Platelets: 279 10*3/uL (ref 150.0–400.0)
RBC: 3.99 Mil/uL — AB (ref 4.22–5.81)
RDW: 17.6 % — ABNORMAL HIGH (ref 11.5–15.5)
WBC: 13.2 10*3/uL — ABNORMAL HIGH (ref 4.0–10.5)

## 2014-02-20 LAB — COMPREHENSIVE METABOLIC PANEL
ALK PHOS: 188 U/L — AB (ref 39–117)
ALT: 29 U/L (ref 0–53)
AST: 65 U/L — ABNORMAL HIGH (ref 0–37)
Albumin: 2.5 g/dL — ABNORMAL LOW (ref 3.5–5.2)
BUN: 19 mg/dL (ref 6–23)
CALCIUM: 7.8 mg/dL — AB (ref 8.4–10.5)
CO2: 26 mEq/L (ref 19–32)
CREATININE: 1.6 mg/dL — AB (ref 0.4–1.5)
Chloride: 90 mEq/L — ABNORMAL LOW (ref 96–112)
GFR: 44.5 mL/min — ABNORMAL LOW (ref >60.00)
Glucose, Bld: 228 mg/dL — ABNORMAL HIGH (ref 70–99)
Potassium: 3.5 mEq/L (ref 3.5–5.1)
Sodium: 127 mEq/L — ABNORMAL LOW (ref 135–145)
Total Bilirubin: 2.2 mg/dL — ABNORMAL HIGH (ref 0.2–1.2)
Total Protein: 6 g/dL (ref 6.0–8.3)

## 2014-02-20 LAB — C-REACTIVE PROTEIN: CRP: 11.8 mg/dL (ref 0.5–20.0)

## 2014-02-20 LAB — SEDIMENTATION RATE: Sed Rate: 41 mm/hr — ABNORMAL HIGH (ref 0–22)

## 2014-02-20 MED ORDER — INSULIN ASPART 100 UNIT/ML FLEXPEN: PEN_INJECTOR | SUBCUTANEOUS | Status: DC

## 2014-02-20 NOTE — Progress Notes (Addendum)
OFFICE NOTE  02/21/2014  CC:  Chief Complaint  Patient presents with  . Follow-up   HPI: Patient is a 78 y.o. Caucasian male who is here for further discussion/eval of lack of appetite. Since last visit he got his abd u/s and this was remarkable only for GB sludge. We discussed the fact that he has hx of colonic stricture (11/2012) suspicious for a colon carcinoma and this is the most likely condition leading to lack of appetite until proven otherwise.  He had a stress test last summer after this in prep for possible surgery with Dr. Lucia Gaskins and this was intermediate risk---showed old defects, EF 34%, no new abnormalities.  He was deemed ok to proceed with planned procedure but for some reason this did not take place. He has not been back to Dr. Earlean Shawl (GI) or to Dr. Lucia Gaskins (surgery) since last summer. Other possible things contributing to his lack of appetite are bowel edema from his chronic systolic heart failure plus possible medication side effect from amiodarone, mexiletine, and eliquis.  Trouble sleeping, onset about 2-3 wks ago, the last 1 wk being horrible.  He takes an OTC sleep aid that used to help but doesn't now.  His mind won't shut off at all.  He asks if the eliquis or the amio could cause this.  I recently called in restoril but he apparently did not know this so he hasn't tried it yet.  Fasting glucoses: normal (70s to low 100s).  Taking 10 U Lantus qhs. Evenings after supper in the 200s-300s.  Pertinent PMH:  Past Medical History  Diagnosis Date  . VENTRICULAR TACHYCARDIA   . PERIPHERAL VASCULAR DISEASE   . HYPERTENSION   . HYPERCHOLESTEROLEMIA   . CORONARY ARTERY DISEASE     Hx of CABG  . Chronic systolic heart failure     Ischemic cardiomyopathy (01/2014 EF 40-45% with hypokinesis + small area of akinesis  . CAROTID ARTERY DISEASE   . SEBORRHEIC DERMATITIS   . Chronic renal insufficiency, stage III (moderate)   . DIVERTICULOSIS OF COLON   . COPD, severe   .  COLONIC POLYPS   . ANXIETY   . ICD (implantable cardiac defibrillator) in place   . Type II diabetes mellitus   . Chronic lower back pain   . DJD (degenerative joint disease)     "hands" (05/24/2012)  . Osteoarthritis of finger   . History of atrial flutter   . Thrombus 12/2013    on ICD lead--started anticoag and his cardioversion for atrial flutter was postponed.  . Colon stricture 2015    with features worrisome for mass, also with recent rising CEA level (possible colon cancer)-GI MD= Dr. Franki Cabot recent eval/follow up with Dr. Earlean Shawl was summer 2014, at which time he recommended colonoscopy but pt declined.   Past Surgical History  Procedure Laterality Date  . Coronary artery bypass graft  1990's    CABG X4  . Tonsillectomy  ?1942  . Cardiac defibrillator placement  12/2004    single chamber defibrillator- Medtronic Maxima 8/06 DrKlein [Other][  . Tee without cardioversion N/A 12/22/2013    Procedure: TRANSESOPHAGEAL ECHOCARDIOGRAM (TEE);  Surgeon: Thayer Headings, MD;  Location: [place];  Service: Cardiovascular;  Laterality: N/A;  . Cardioversion N/A 12/22/2013    Procedure: CARDIOVERSION;  Surgeon: Thayer Headings, MD;  Location: [facility];  Service: Cardiovascular;  Laterality: N/A;  . Cardiovascular stress test  01/12/13    myocard perf imaging: previous infarct with a moderately reduced EF  as was known previously.  No new findings.   . Gastric emptying scan  02/02/14    Normal  . Transthoracic echocardiogram  01/25/14    EF 40-45%, diffuse hypokinesis, infero-basilar myocardial akinesis, PA pressure slightly high, valves fine.  . Abdominal ultrasound  01/2014    GB sludge, o/w normal  . Ct virtual colonoscopy diagnostic  11/2012    Asymmetric thickening of the rectosigmoid colon worrisome for  . Cardiovascular stress test      Lexiscan: There is a medium sized fixed inferior wall defect of moderate severity involving the apical inferior and mid inferior and basal  inferoseptal segments, consistent with old inferior MI. No significant reversible ischemia.  EF 34%, inferior wall hypokinesis--intermediate risk scan (ok to do planned procedure)    MEDS:  Outpatient Prescriptions Prior to Visit  Medication Sig Dispense Refill  . amiodarone (PACERONE) 200 MG tablet Take 1 tablet (200 mg total) by mouth daily.  30 tablet  6  . apixaban (ELIQUIS) 5 MG TABS tablet Take 1 tablet (5 mg total) by mouth 2 (two) times daily.  60 tablet  6  . DiphenhydrAMINE HCl, Sleep, 50 MG CAPS Take 50 mg by mouth at bedtime as needed.      . Fluticasone-Salmeterol (ADVAIR) 500-50 MCG/DOSE AEPB Inhale 1 puff into the lungs 2 (two) times daily.      . furosemide (LASIX) 40 MG tablet Take 40 mg and alternate with 20 mg daily  60 tablet  6  . Guaifenesin (MUCINEX MAXIMUM STRENGTH) 1200 MG TB12 Take 1,200 mg by mouth 2 (two) times daily.       . hydrALAZINE (APRESOLINE) 50 MG tablet Take 1 tablet (50 mg total) by mouth every 8 (eight) hours.  90 tablet  6  . Insulin Glargine (LANTUS SOLOSTAR) 100 UNIT/ML Solostar Pen 10 units SQ qhs  15 mL  11  . isosorbide mononitrate (IMDUR) 30 MG 24 hr tablet Take 1 tablet (30 mg total) by mouth daily.  30 tablet  6  . mexiletine (MEXITIL) 150 MG capsule Take 150 mg by mouth 2 (two) times daily.      Marland Kitchen mexiletine (MEXITIL) 150 MG capsule TAKE ONE CAPSULE BY MOUTH TWICE DAILY  60 capsule  0  . pravastatin (PRAVACHOL) 80 MG tablet Take 80 mg by mouth daily.      . temazepam (RESTORIL) 7.5 MG capsule 1-2 tabs po qhs prn insomnia  30 capsule  1  . tiotropium (SPIRIVA) 18 MCG inhalation capsule Place 18 mcg into inhaler and inhale every evening. Between 4 & 6 pm       No facility-administered medications prior to visit.    PE: Blood pressure 118/62, pulse 93, temperature 98.1 F (36.7 C), temperature source Oral, height 5' 6.5" (1.689 m), weight 150 lb 12.8 oz (68.402 kg), SpO2 92.00%. Gen: Alert, well appearing.  Patient is oriented to person,  place, time, and situation. No further exam today.  LABS: none today  IMPRESSION AND PLAN:  1) Anorexia, chronic.  He has had about a 25 lb non-purposeful weight loss over the last 2 yrs. GI workup summer 2014 worrisome for colon carcinoma, unclear why surgery not carried out.  Unfortunately, patient doesn't have good recollection of details.  I recommended he call Dr. Liliane Channel office to arrange a follow up visit. I will recheck a CEA, CMET, CBC, ESR, and CRP today.  2) DM 2, fasting control is good, postprandial control not good. Continue lantus 10 units qhs.  Start novolog 5  U SQ qAC.  Check glucose fasting, before lunch and before supper, and again before bedtime.  Insulin treatment is so overwhelming for he and his wife at this time that a discussion of insulin titration would not be fruitful today, so I'll see him back in office in 2 wks to review numbers and make dose adjustments and possibly discuss self-titration at that time.   Next A1c due towards the end of November 2015.   Will obtain urine microal/cr next f/u visit. Needs feet exam and diab retinopathy screening exam in near future.  Prevnar 13 can be offered after 01/28/15.  3) Insomnia: start restoril 7.5-15mg qhs.  Stop daytime napping.   Therapeutic expectations and side effect profile of medication discussed today.  Patient's questions answered.  An After Visit Summary was printed and given to the patient.  FOLLOW UP: 2 wks

## 2014-02-20 NOTE — Progress Notes (Deleted)
Pre visit review using our clinic review tool, if applicable. No additional management support is needed unless otherwise documented below in the visit note. 

## 2014-02-20 NOTE — Patient Instructions (Addendum)
Call Dr. Liliane Channel office at [phone] to arrange a follow up visit.  Continue taking 10 Units of Lantus insulin at bedtime every night.  Start taking 5 units of Novolog insulin with each meal. Check your sugar fasting every morning and then again before lunch, before supper, and before bedtime.

## 2014-02-20 NOTE — Progress Notes (Signed)
Pre visit review using our clinic review tool, if applicable. No additional management support is needed unless otherwise documented below in the visit note. 

## 2014-02-21 LAB — CEA: CEA: 9 ng/mL — ABNORMAL HIGH (ref 0.0–5.0)

## 2014-02-22 ENCOUNTER — Encounter: Payer: Self-pay | Admitting: Family Medicine

## 2014-03-01 ENCOUNTER — Ambulatory Visit (HOSPITAL_COMMUNITY)
Admission: RE | Admit: 2014-03-01 | Discharge: 2014-03-01 | Disposition: A | Discharge status: Home / Self Care | Payer: Medicare Other | Source: Ambulatory Visit | Attending: Cardiology | Admitting: Cardiology

## 2014-03-01 VITALS — BP 128/62 | HR 90 | Wt 152.0 lb

## 2014-03-01 DIAGNOSIS — I130 Hypertensive heart and chronic kidney disease with heart failure and stage 1 through stage 4 chronic kidney disease, or unspecified chronic kidney disease: Secondary | ICD-10-CM | POA: Insufficient documentation

## 2014-03-01 DIAGNOSIS — Z95810 Presence of automatic (implantable) cardiac defibrillator: Secondary | ICD-10-CM | POA: Insufficient documentation

## 2014-03-01 DIAGNOSIS — Z79899 Other long term (current) drug therapy: Secondary | ICD-10-CM | POA: Diagnosis not present

## 2014-03-01 DIAGNOSIS — I255 Ischemic cardiomyopathy: Secondary | ICD-10-CM | POA: Insufficient documentation

## 2014-03-01 DIAGNOSIS — I4729 Other ventricular tachycardia: Secondary | ICD-10-CM

## 2014-03-01 DIAGNOSIS — I4891 Unspecified atrial fibrillation: Secondary | ICD-10-CM | POA: Insufficient documentation

## 2014-03-01 DIAGNOSIS — Z951 Presence of aortocoronary bypass graft: Secondary | ICD-10-CM | POA: Diagnosis not present

## 2014-03-01 DIAGNOSIS — I472 Ventricular tachycardia: Secondary | ICD-10-CM

## 2014-03-01 DIAGNOSIS — Z794 Long term (current) use of insulin: Secondary | ICD-10-CM | POA: Insufficient documentation

## 2014-03-01 DIAGNOSIS — I739 Peripheral vascular disease, unspecified: Secondary | ICD-10-CM | POA: Diagnosis not present

## 2014-03-01 DIAGNOSIS — J449 Chronic obstructive pulmonary disease, unspecified: Secondary | ICD-10-CM | POA: Insufficient documentation

## 2014-03-01 DIAGNOSIS — E119 Type 2 diabetes mellitus without complications: Secondary | ICD-10-CM | POA: Insufficient documentation

## 2014-03-01 DIAGNOSIS — J45909 Unspecified asthma, uncomplicated: Secondary | ICD-10-CM | POA: Diagnosis not present

## 2014-03-01 DIAGNOSIS — I2510 Atherosclerotic heart disease of native coronary artery without angina pectoris: Secondary | ICD-10-CM | POA: Diagnosis not present

## 2014-03-01 DIAGNOSIS — N183 Chronic kidney disease, stage 3 unspecified: Secondary | ICD-10-CM

## 2014-03-01 DIAGNOSIS — I4892 Unspecified atrial flutter: Secondary | ICD-10-CM | POA: Insufficient documentation

## 2014-03-01 DIAGNOSIS — I5022 Chronic systolic (congestive) heart failure: Secondary | ICD-10-CM | POA: Diagnosis present

## 2014-03-01 DIAGNOSIS — Z7901 Long term (current) use of anticoagulants: Secondary | ICD-10-CM | POA: Diagnosis not present

## 2014-03-01 LAB — T3: T3 TOTAL: 84.9 ng/dL (ref 80.0–204.0)

## 2014-03-01 LAB — TSH: TSH: 19.84 u[IU]/mL — AB (ref 0.350–4.500)

## 2014-03-01 LAB — T4, FREE: FREE T4: 1.03 ng/dL (ref 0.80–1.80)

## 2014-03-01 MED ORDER — CARVEDILOL 3.125 MG PO TABS: 3.1250 mg | ORAL_TABLET | Freq: Two times a day (BID) | ORAL | Status: DC

## 2014-03-01 NOTE — Progress Notes (Addendum)
Patient ID: [name], male   DOB: 1936-01-18, 78 y.o.   MRN: [id number]  PCP: Dr Ernestine Conrad GI: Dr Earlean Shawl.    HPI: [name] is a 78 yo male with a history of HTN, CAD s/p CABG, ICM s/pMedtronic ICD, chronic systolic heart failure, DM2, Vtach, atrial flutter, PVD and GI mass. ECHO (12/22/13) EF 20-25%, mod [name], no evidence of thrombus in LA, mod TR, and CT showed diverticulosis question of possible rectosigmoid.   On September 1st he was admitted from Dr. Rosezella [place] office with volume overload and low output heart failure. Creatinine was 2.8 on admit (baseline 1.6). Central line was placed to monitor CVP and CO-OX. Initial CO-OX was 49% and he was started on Milrinone and IV lasix. His enalapril and carvedilol were stopped. On Milrinone the CO-OX improved to 67%. Once heart failure was optimized Milrinone was weaned off and he transitioned to lasix 40 mg daily. Hydralazine and nitrates were started and titrated to hydralazine 50 mg tid/imdur 30 mg daily. Renal function continued to improve and on the day of discharge Creatinine was 1.42. He was not placed on an ace or spironolactone due to elevated creatinine noted on admit. He will not be placed on beta blocker due to low output noted on admit. Overall he diuresed 20 pounds. Discharge weight was 151 pounds. He also continued on amiodarone 200 mg twice a day with apixaban 5 mg twice a day.   He returns for follow up. Last visit amiodarone was cut back to 200 mg daily and lasix was cut back to 40 mg and alternate with 20 mg. Dr Anitra Lauth cut back lasix to 20 mg daily due to elevated creatinine. Mild dyspnea with exertion. Denies Orthopnea/PND.  Dizziness better. Tremor improved with decreased amiodarone but still present. . Weight  146-147 pounds. Poor appetite. Has nausea ongoing problems with nausea. Started on insulin by PCP.   Optivol: Fluid index approaching threshold and impedance trending down.   Labs 02/09/14 K 3.9 Creatinine 1.38 Pro BNP  2555  Labs 02/20/14 K 3.5 NA 127 Creatinine 1.6  Labs 03/03/14 CEA 9.0   CT Virtual Colonoscopy- Diverticulosis ? Rectosigmoid Colon Ca  ROS: All systems negative except as listed in HPI, PMH and Problem List.  Past Medical History  Diagnosis Date  . VENTRICULAR TACHYCARDIA   . PERIPHERAL VASCULAR DISEASE   . HYPERTENSION   . HYPERCHOLESTEROLEMIA   . CORONARY ARTERY DISEASE     Hx of CABG  . Chronic systolic heart failure     Ischemic cardiomyopathy (01/2014 EF 40-45% with hypokinesis + small area of akinesis  . CAROTID ARTERY DISEASE   . SEBORRHEIC DERMATITIS   . Chronic renal insufficiency, stage III (moderate)     CrCl 40s-50s  . DIVERTICULOSIS OF COLON   . COPD, severe   . COLONIC POLYPS   . ANXIETY   . ICD (implantable cardiac defibrillator) in place   . Type II diabetes mellitus   . Chronic lower back pain   . DJD (degenerative joint disease)     "hands" (05/24/2012)  . Osteoarthritis of finger   . History of atrial flutter   . Thrombus 12/2013    on ICD lead--started anticoag and his cardioversion for atrial flutter was postponed.  . Colon stricture 2015    with features worrisome for mass, also with recent rising CEA level (possible colon cancer)-GI MD= Dr. Franki Cabot recent eval/follow up with Dr. Earlean Shawl was summer 2014, at which time he recommended colonoscopy but pt declined.  Current Outpatient Prescriptions  Medication Sig Dispense Refill  . amiodarone (PACERONE) 200 MG tablet Take 1 tablet (200 mg total) by mouth daily.  30 tablet  6  . apixaban (ELIQUIS) 5 MG TABS tablet Take 1 tablet (5 mg total) by mouth 2 (two) times daily.  60 tablet  6  . DiphenhydrAMINE HCl, Sleep, 50 MG CAPS Take 50 mg by mouth at bedtime as needed.      . Fluticasone-Salmeterol (ADVAIR) 500-50 MCG/DOSE AEPB Inhale 1 puff into the lungs 2 (two) times daily.      . furosemide (LASIX) 40 MG tablet Take 20 mg by mouth daily.      . Guaifenesin (MUCINEX MAXIMUM STRENGTH) 1200 MG TB12  Take 1,200 mg by mouth 2 (two) times daily.       . hydrALAZINE (APRESOLINE) 50 MG tablet Take 1 tablet (50 mg total) by mouth every 8 (eight) hours.  90 tablet  6  . insulin aspart (NOVOLOG FLEXPEN) 100 UNIT/ML FlexPen Inject as directed by MD SQ with each meal  15 mL  11  . Insulin Glargine (LANTUS SOLOSTAR) 100 UNIT/ML Solostar Pen 10 units SQ qhs  15 mL  11  . isosorbide mononitrate (IMDUR) 30 MG 24 hr tablet Take 1 tablet (30 mg total) by mouth daily.  30 tablet  6  . mexiletine (MEXITIL) 150 MG capsule Take 150 mg by mouth 2 (two) times daily.      . pravastatin (PRAVACHOL) 80 MG tablet Take 80 mg by mouth daily.      . temazepam (RESTORIL) 7.5 MG capsule 1-2 tabs po qhs prn insomnia  30 capsule  1  . tiotropium (SPIRIVA) 18 MCG inhalation capsule Place 18 mcg into inhaler and inhale every evening. Between 4 & 6 pm       No current facility-administered medications for this encounter.     PHYSICAL EXAM: Filed Vitals:   03/01/14 1458  BP: 128/62  Pulse: 90  Weight: 152 lb (68.947 kg)  SpO2: 93%    General:  Well appearing. No resp difficulty HEENT: normal Neck: supple. JVP flat. Carotids 2+ bilaterally; no bruits. No lymphadenopathy or thryomegaly appreciated. Cor: PMI normal. Regular rate & rhythm. No rubs, gallops or murmurs. Lungs: clear Abdomen: soft, nontender, nondistended. No hepatosplenomegaly. No bruits or masses. Good bowel sounds. Extremities: no cyanosis, clubbing, rash, R and LLE 1+ edema Neuro: alert & orientedx3, cranial nerves grossly intact. Moves all 4 extremities w/o difficulty. Affect pleasant.  ASSESSMENT & PLAN: [name] is a 78 year old with ischemic cardiomyopathy and chronic systolic heart failure recently admitted with cardiogenic shock that required short term milrinone. 1. Chronic Systolic Heart Failure: Ischemic cardiomyopathy. Has Medtronic ICD. ECHO 01/25/14 EF 40-45%. NYHA II-III. Volume status ok on exam, but Optivol suggests increasing fluid  retention.  - Take Lasix 40 mg daily x 2 days then back to 20 daily.   - Add Coreg 3.125 mg bid.  - Continue hydralazine 50 mg every 8 hours/Imdur 30 mg daily - He is not Ace or spiro due to CKD 2. A fib/Aflutter: Maintaining SR.  - Continue amiodarone 200 mg daily. Check TSH, T3, T4 and LFTs. Will need yearly eye exams.  - Continue Eliquis 5 mg bid.  3. Hand Tremors: Improved but still present.  Possibly related to amiodarone. For now, continue amio 200 mg daily.   4. CKD: Stable III, last creatinine 1.6.  5. DM: Per PCP. I have asked him schedule follow up. 6. CAD: s/p CABG.  Continue pravastatin.  Not on ASA with apixaban use.  7. COPD 8. H/o VT: He has an ICD and is now on amiodarone.  He can stop mexiletine at this point. As above, adding Coreg at low dose.  9. Possible colon cancer: To followup with Dr. Earlean Shawl.   Followup in 1 month  CLEGG,AMY NP-C  3:16 PM  Patient seen with NP, agree with the above note.   - Looks ok on exam but Optivol suggests fluid increase.  I will treat with a few days of increased Lasix. - Add Coreg 3.125 mg bid. - May stop mexiletine.  - Check LFTs/TSH given amiodarone use. - BMET  [name] 03/01/2014

## 2014-03-01 NOTE — Patient Instructions (Addendum)
Follow up in 1 month  Take carvedilol 3.125 mg twice a day  Stop Mexiletine  Take an extra 20 mg lasix for the next 2 days.   Do the following things EVERYDAY: 1) Weigh yourself in the morning before breakfast. Write it down and keep it in a log. 2) Take your medicines as prescribed 3) Eat low salt foods-Limit salt (sodium) to 2000 mg per day.  4) Stay as active as you can everyday Limit all fluids for the day to less than 2 liters

## 2014-03-03 ENCOUNTER — Other Ambulatory Visit (INDEPENDENT_AMBULATORY_CARE_PROVIDER_SITE_OTHER): Payer: Medicare Other

## 2014-03-03 DIAGNOSIS — E871 Hypo-osmolality and hyponatremia: Secondary | ICD-10-CM

## 2014-03-03 DIAGNOSIS — D72829 Elevated white blood cell count, unspecified: Secondary | ICD-10-CM

## 2014-03-03 LAB — BASIC METABOLIC PANEL
BUN: 12 mg/dL (ref 6–23)
CHLORIDE: 102 meq/L (ref 96–112)
CO2: 25 meq/L (ref 19–32)
Calcium: 8.5 mg/dL (ref 8.4–10.5)
Creatinine, Ser: 1.2 mg/dL (ref 0.4–1.5)
GFR: 63.08 mL/min (ref >60.00)
Glucose, Bld: 157 mg/dL — ABNORMAL HIGH (ref 70–99)
POTASSIUM: 4.3 meq/L (ref 3.5–5.1)
SODIUM: 134 meq/L — AB (ref 135–145)

## 2014-03-03 LAB — CBC WITH DIFFERENTIAL/PLATELET
BASOS PCT: 0.6 % (ref 0.0–3.0)
Basophils Absolute: 0.1 10*3/uL (ref 0.0–0.1)
EOS PCT: 4.4 % (ref 0.0–5.0)
Eosinophils Absolute: 0.5 10*3/uL (ref 0.0–0.7)
HCT: 38.9 % — ABNORMAL LOW (ref 39.0–52.0)
Hemoglobin: 12.7 g/dL — ABNORMAL LOW (ref 13.0–17.0)
LYMPHS PCT: 10.9 % — AB (ref 12.0–46.0)
Lymphs Abs: 1.2 10*3/uL (ref 0.7–4.0)
MCHC: 32.7 g/dL (ref 30.0–36.0)
MCV: 92.5 fl (ref 78.0–100.0)
MONOS PCT: 10.7 % (ref 3.0–12.0)
Monocytes Absolute: 1.2 10*3/uL — ABNORMAL HIGH (ref 0.1–1.0)
NEUTROS PCT: 73.4 % (ref 43.0–77.0)
Neutro Abs: 8.1 10*3/uL — ABNORMAL HIGH (ref 1.4–7.7)
PLATELETS: 283 10*3/uL (ref 150.0–400.0)
RBC: 4.2 Mil/uL — ABNORMAL LOW (ref 4.22–5.81)
RDW: 17.4 % — ABNORMAL HIGH (ref 11.5–15.5)
WBC: 11 10*3/uL — AB (ref 4.0–10.5)

## 2014-03-06 ENCOUNTER — Other Ambulatory Visit: Payer: Self-pay

## 2014-03-06 MED ORDER — PRAVASTATIN SODIUM 80 MG PO TABS: 80.0000 mg | ORAL_TABLET | Freq: Every day | ORAL | Status: DC

## 2014-03-13 ENCOUNTER — Ambulatory Visit (INDEPENDENT_AMBULATORY_CARE_PROVIDER_SITE_OTHER): Payer: Medicare Other | Admitting: Family Medicine

## 2014-03-13 ENCOUNTER — Encounter: Payer: Self-pay | Admitting: Family Medicine

## 2014-03-13 VITALS — BP 163/66 | HR 68 | Temp 97.1°F | Resp 18 | Ht 66.5 in | Wt 151.0 lb

## 2014-03-13 DIAGNOSIS — Z135 Encounter for screening for eye and ear disorders: Secondary | ICD-10-CM

## 2014-03-13 DIAGNOSIS — E1165 Type 2 diabetes mellitus with hyperglycemia: Secondary | ICD-10-CM

## 2014-03-13 DIAGNOSIS — IMO0002 Reserved for concepts with insufficient information to code with codable children: Secondary | ICD-10-CM

## 2014-03-13 DIAGNOSIS — I2510 Atherosclerotic heart disease of native coronary artery without angina pectoris: Secondary | ICD-10-CM

## 2014-03-13 DIAGNOSIS — G4700 Insomnia, unspecified: Secondary | ICD-10-CM

## 2014-03-13 DIAGNOSIS — E1159 Type 2 diabetes mellitus with other circulatory complications: Secondary | ICD-10-CM | POA: Insufficient documentation

## 2014-03-13 DIAGNOSIS — K56699 Other intestinal obstruction unspecified as to partial versus complete obstruction: Secondary | ICD-10-CM

## 2014-03-13 DIAGNOSIS — K5669 Other intestinal obstruction: Secondary | ICD-10-CM

## 2014-03-13 LAB — MICROALBUMIN / CREATININE URINE RATIO
Creatinine,U: 115.3 mg/dL
MICROALB/CREAT RATIO: 0.8 mg/g (ref 0.0–30.0)
Microalb, Ur: 0.9 mg/dL (ref 0.0–1.9)

## 2014-03-13 NOTE — Progress Notes (Signed)
Pre visit review using our clinic review tool, if applicable. No additional management support is needed unless otherwise documented below in the visit note. 

## 2014-03-13 NOTE — Assessment & Plan Note (Signed)
Control improving minimally, but some random hypoglycemic episodes are hampering aggressive insulin titration. Continue lantus 10 U qhs. Increase novolog to 7 U qBF, 8 U qLunch, and 9 U qSupper. Referred pt to Dr. Zigmund Daniel for diab retinopathy screening exam. Check urine microalb/cr today.

## 2014-03-13 NOTE — Progress Notes (Signed)
OFFICE NOTE  03/13/2014  CC:  Chief Complaint  Patient presents with  . Diabetes    HPI: Patient is a 78 y.o. Caucasian male who is here for 3 wks f/u DM 2 and insomnia. Started mealtime insulin last visit: 5 U qAC, continued lantus 10 U qhs. Reviewed glucose meter today and it only has 4-5 results in it.  One is in the 63s and is in the evening, otherwise the evening measurements are in the 200s still. Fastings closer to goal of 100-110.  Again, it is surprising how little he checks his glucose.  Occ bp check at home since last visit was normal.  Started trial of restoril 7.5-15mg  qhs prn insomnia.  Sleep improved, didn't have to take any the last couple of nights.  He plans on contacting Dr. Liliane Channel office today to arrange f/u re: his hx of possible colon mass + wt loss and recently continued rise of his CEA level.  Pertinent PMH:  Past medical, surgical, social, and family history reviewed and no changes are noted since last office visit.  MEDS:  Outpatient Prescriptions Prior to Visit  Medication Sig Dispense Refill  . amiodarone (PACERONE) 200 MG tablet Take 1 tablet (200 mg total) by mouth daily.  30 tablet  6  . apixaban (ELIQUIS) 5 MG TABS tablet Take 1 tablet (5 mg total) by mouth 2 (two) times daily.  60 tablet  6  . carvedilol (COREG) 3.125 MG tablet Take 1 tablet (3.125 mg total) by mouth 2 (two) times daily with a meal.  60 tablet  6  . DiphenhydrAMINE HCl, Sleep, 50 MG CAPS Take 50 mg by mouth at bedtime as needed.      . Fluticasone-Salmeterol (ADVAIR) 500-50 MCG/DOSE AEPB Inhale 1 puff into the lungs 2 (two) times daily.      . furosemide (LASIX) 40 MG tablet Take 20 mg by mouth daily.      . Guaifenesin (MUCINEX MAXIMUM STRENGTH) 1200 MG TB12 Take 1,200 mg by mouth 2 (two) times daily.       . hydrALAZINE (APRESOLINE) 50 MG tablet Take 1 tablet (50 mg total) by mouth every 8 (eight) hours.  90 tablet  6  . insulin aspart (NOVOLOG FLEXPEN) 100 UNIT/ML FlexPen  Inject as directed by MD SQ with each meal  15 mL  11  . Insulin Glargine (LANTUS SOLOSTAR) 100 UNIT/ML Solostar Pen 10 units SQ qhs  15 mL  11  . isosorbide mononitrate (IMDUR) 30 MG 24 hr tablet Take 1 tablet (30 mg total) by mouth daily.  30 tablet  6  . pravastatin (PRAVACHOL) 80 MG tablet Take 1 tablet (80 mg total) by mouth daily.  30 tablet  10  . temazepam (RESTORIL) 7.5 MG capsule 1-2 tabs po qhs prn insomnia  30 capsule  1  . tiotropium (SPIRIVA) 18 MCG inhalation capsule Place 18 mcg into inhaler and inhale every evening. Between 4 & 6 pm       No facility-administered medications prior to visit.    PE: Blood pressure 163/66, pulse 68, temperature 97.1 F (36.2 C), temperature source Temporal, resp. rate 18, height 5' 6.5" (1.689 m), weight 151 lb (68.493 kg), SpO2 98.00%. Gen: Alert, well appearing.  Patient is oriented to person, place, time, and situation. No further exam today.  LABS: none today Recent:  Lab Results  Component Value Date   HGBA1C 5.7 01/11/2014    IMPRESSION AND PLAN:  Type 2 diabetes mellitus, uncontrolled Control improving minimally, but some  random hypoglycemic episodes are hampering aggressive insulin titration. Continue lantus 10 U qhs. Increase novolog to 7 U qBF, 8 U qLunch, and 9 U qSupper. Referred pt to Dr. Zigmund Daniel for diab retinopathy screening exam. Check urine microalb/cr today.  Insomnia Improved. Continue restoril 7.5-15mg  qhs prn. No new rx given today.  Colonic stricture Pt says he will be calling today to get appt with Dr. Earlean Shawl, his GI MD. He and his wife are encouraged that their CHF MD, Dr. Jeffie Pollock, recently told them that he should pursue surgical treatment for this problem if it is recommended again.   An After Visit Summary was printed and given to the patient.  FOLLOW UP: 2 mo-repeat HbA1c at that time.

## 2014-03-13 NOTE — Assessment & Plan Note (Signed)
Pt says he will be calling today to get appt with Dr. Earlean Shawl, his GI MD. He and his wife are encouraged that their CHF MD, Dr. Jeffie Pollock, recently told them that he should pursue surgical treatment for this problem if it is recommended again.

## 2014-03-13 NOTE — Assessment & Plan Note (Signed)
Improved. Continue restoril 7.5-15mg  qhs prn. No new rx given today.

## 2014-03-13 NOTE — Patient Instructions (Signed)
Novolog insulin: take 7 units at BF, 8 Units at lunch, and 9 units at [place]. Continue with 10 Units of Lantus insulin at bedtime every night.

## 2014-03-14 DIAGNOSIS — I5022 Chronic systolic (congestive) heart failure: Secondary | ICD-10-CM

## 2014-03-14 DIAGNOSIS — I472 Ventricular tachycardia: Secondary | ICD-10-CM

## 2014-03-15 ENCOUNTER — Ambulatory Visit (INDEPENDENT_AMBULATORY_CARE_PROVIDER_SITE_OTHER): Payer: Medicare Other | Admitting: *Deleted

## 2014-03-15 DIAGNOSIS — I472 Ventricular tachycardia, unspecified: Secondary | ICD-10-CM

## 2014-03-15 DIAGNOSIS — I4729 Other ventricular tachycardia: Secondary | ICD-10-CM

## 2014-03-15 DIAGNOSIS — I5022 Chronic systolic (congestive) heart failure: Secondary | ICD-10-CM

## 2014-03-15 NOTE — Progress Notes (Signed)
Remote ICD transmission.   

## 2014-03-16 LAB — MDC_IDC_ENUM_SESS_TYPE_REMOTE
Battery Remaining Longevity: 127 mo
Battery Voltage: 3.02 V
Brady Statistic RV Percent Paced: 0.01 %
Date Time Interrogation Session: 20151020153803
HighPow Impedance: 66 Ohm
Lead Channel Sensing Intrinsic Amplitude: 17.625 mV
Lead Channel Setting Pacing Pulse Width: 0.4 ms
Lead Channel Setting Sensing Sensitivity: 0.3 mV
MDC IDC MSMT LEADCHNL RV IMPEDANCE VALUE: 285 Ohm
MDC IDC MSMT LEADCHNL RV IMPEDANCE VALUE: 323 Ohm
MDC IDC MSMT LEADCHNL RV PACING THRESHOLD AMPLITUDE: 1 V
MDC IDC MSMT LEADCHNL RV PACING THRESHOLD PULSEWIDTH: 0.4 ms
MDC IDC MSMT LEADCHNL RV SENSING INTR AMPL: 17.625 mV
MDC IDC SET LEADCHNL RV PACING AMPLITUDE: 2.25 V
MDC IDC SET ZONE DETECTION INTERVAL: 450 ms
Zone Setting Detection Interval: 300 ms
Zone Setting Detection Interval: 430 ms

## 2014-03-23 ENCOUNTER — Encounter: Payer: Self-pay | Admitting: Internal Medicine

## 2014-03-29 ENCOUNTER — Ambulatory Visit (HOSPITAL_COMMUNITY)
Admission: RE | Admit: 2014-03-29 | Discharge: 2014-03-29 | Disposition: A | Discharge status: Home / Self Care | Payer: Medicare Other | Source: Ambulatory Visit | Attending: Internal Medicine | Admitting: Internal Medicine

## 2014-03-29 VITALS — BP 106/48 | HR 68 | Wt 152.5 lb

## 2014-03-29 DIAGNOSIS — Z951 Presence of aortocoronary bypass graft: Secondary | ICD-10-CM | POA: Diagnosis not present

## 2014-03-29 DIAGNOSIS — I5022 Chronic systolic (congestive) heart failure: Secondary | ICD-10-CM | POA: Diagnosis present

## 2014-03-29 DIAGNOSIS — N183 Chronic kidney disease, stage 3 (moderate): Secondary | ICD-10-CM | POA: Diagnosis not present

## 2014-03-29 DIAGNOSIS — J449 Chronic obstructive pulmonary disease, unspecified: Secondary | ICD-10-CM | POA: Diagnosis not present

## 2014-03-29 DIAGNOSIS — I2510 Atherosclerotic heart disease of native coronary artery without angina pectoris: Secondary | ICD-10-CM | POA: Insufficient documentation

## 2014-03-29 DIAGNOSIS — I4891 Unspecified atrial fibrillation: Secondary | ICD-10-CM | POA: Diagnosis not present

## 2014-03-29 DIAGNOSIS — E119 Type 2 diabetes mellitus without complications: Secondary | ICD-10-CM | POA: Insufficient documentation

## 2014-03-29 MED ORDER — FUROSEMIDE 20 MG PO TABS: 20.0000 mg | ORAL_TABLET | Freq: Every day | ORAL | Status: DC

## 2014-03-29 NOTE — Patient Instructions (Signed)
Follow up in 1 month with an ECHO  Tale lasix 20 mg daily. IF your weight is 154 pounds take an extra 20 mg of lasi

## 2014-03-29 NOTE — Progress Notes (Signed)
Patient ID: [name], male   DOB: 1935/06/05, 78 y.o.   MRN: [id number]  PCP: Dr Ernestine Conrad GI: Dr Earlean Shawl.    HPI: [name] is a 78 yo male with a history of HTN, CAD s/p CABG, ICM s/pMedtronic ICD, chronic systolic heart failure, DM2, Vtach, atrial flutter, PVD and GI mass. ECHO (12/22/13) EF 20-25%, mod [name], no evidence of thrombus in LA, mod TR, and CT showed diverticulosis question of possible rectosigmoid.   On September 1st he was admitted from Dr. Rosezella [place] office with volume overload and low output heart failure. Creatinine was 2.8 on admit (baseline 1.6). Central line was placed to monitor CVP and CO-OX. Initial CO-OX was 49% and he was started on Milrinone and IV lasix. His enalapril and carvedilol were stopped. On Milrinone the CO-OX improved to 67%. Once heart failure was optimized Milrinone was weaned off and he transitioned to lasix 40 mg daily. Hydralazine and nitrates were started and titrated to hydralazine 50 mg tid/imdur 30 mg daily. Renal function continued to improve and on the day of discharge Creatinine was 1.42. He was not placed on an ace or spironolactone due to elevated creatinine noted on admit. He will not be placed on beta blocker due to low output noted on admit. Overall he diuresed 20 pounds. Discharge weight was 151 pounds. He also continued on amiodarone 200 mg twice a day with apixaban 5 mg twice a day.   He returns for follow up. Last visit carvedilol was added. Mexilitene was sotpped. He was evaluated by Dr Anitra Lauth and insulin was increased. Tremors and nausea stopped. Denies SOB/PND/Orthopnea. Mild leg fatigue.Mild dyspnea with steps. Weight at home 148-150 pounds.  Appetite improved.    Optivol: Fluid index approaching threshold and impedance trending down.   Labs 02/09/14 K 3.9 Creatinine 1.38 Pro BNP 2555  Labs 02/20/14 K 3.5 NA 127 Creatinine 1.6  03/01/14 TSH 19.8  T4  1.03 T3 84  Labs 03/03/14 CEA 9.0  K 4.3 NA 134 Hgb 12.7 TSH 19.8 T3 84.9 T4 1.03    CT Virtual Colonoscopy- Diverticulosis ? Rectosigmoid Colon Ca  ROS: All systems negative except as listed in HPI, PMH and Problem List.  Past Medical History  Diagnosis Date  . VENTRICULAR TACHYCARDIA   . PERIPHERAL VASCULAR DISEASE   . HYPERTENSION   . HYPERCHOLESTEROLEMIA   . CORONARY ARTERY DISEASE     Hx of CABG  . Chronic systolic heart failure     Ischemic cardiomyopathy (01/2014 EF 40-45% with hypokinesis + small area of akinesis  . CAROTID ARTERY DISEASE   . SEBORRHEIC DERMATITIS   . Chronic renal insufficiency, stage III (moderate)     CrCl 40s-50s  . DIVERTICULOSIS OF COLON   . COPD, severe   . COLONIC POLYPS   . ANXIETY   . ICD (implantable cardiac defibrillator) in place   . Type II diabetes mellitus   . Chronic lower back pain   . DJD (degenerative joint disease)     "hands" (05/24/2012)  . Osteoarthritis of finger   . History of atrial flutter   . Thrombus 12/2013    on ICD lead--started anticoag and his cardioversion for atrial flutter was postponed.  . Colon stricture 2015    with features worrisome for mass, also with recent rising CEA level (possible colon cancer)-GI MD= Dr. Franki Cabot recent eval/follow up with Dr. Earlean Shawl was summer 2014, at which time he recommended colonoscopy but pt declined.    Current Outpatient Prescriptions  Medication Sig Dispense  Refill  . amiodarone (PACERONE) 200 MG tablet Take 1 tablet (200 mg total) by mouth daily. 30 tablet 6  . apixaban (ELIQUIS) 5 MG TABS tablet Take 1 tablet (5 mg total) by mouth 2 (two) times daily. 60 tablet 6  . carvedilol (COREG) 3.125 MG tablet Take 1 tablet (3.125 mg total) by mouth 2 (two) times daily with a meal. 60 tablet 6  . DiphenhydrAMINE HCl, Sleep, 50 MG CAPS Take 50 mg by mouth at bedtime as needed.    . Fluticasone-Salmeterol (ADVAIR) 500-50 MCG/DOSE AEPB Inhale 1 puff into the lungs 2 (two) times daily.    . furosemide (LASIX) 40 MG tablet Take 40 mg every other day alternating with  20 mg every other day    . Guaifenesin (MUCINEX MAXIMUM STRENGTH) 1200 MG TB12 Take 1,200 mg by mouth 2 (two) times daily.     . hydrALAZINE (APRESOLINE) 50 MG tablet Take 1 tablet (50 mg total) by mouth every 8 (eight) hours. 90 tablet 6  . insulin aspart (NOVOLOG FLEXPEN) 100 UNIT/ML FlexPen Inject as directed by MD SQ with each meal 15 mL 11  . Insulin Glargine (LANTUS SOLOSTAR) 100 UNIT/ML Solostar Pen 10 units SQ qhs 15 mL 11  . isosorbide mononitrate (IMDUR) 30 MG 24 hr tablet Take 1 tablet (30 mg total) by mouth daily. 30 tablet 6  . pravastatin (PRAVACHOL) 80 MG tablet Take 1 tablet (80 mg total) by mouth daily. 30 tablet 10  . temazepam (RESTORIL) 7.5 MG capsule 1-2 tabs po qhs prn insomnia 30 capsule 1  . tiotropium (SPIRIVA) 18 MCG inhalation capsule Place 18 mcg into inhaler and inhale every evening. Between 4 & 6 pm     No current facility-administered medications for this encounter.     PHYSICAL EXAM: Filed Vitals:   03/29/14 1413  BP: 106/48  Pulse: 68  Weight: 152 lb 8 oz (69.174 kg)  SpO2: 93%    General:  Well appearing. No resp difficulty HEENT: normal Neck: supple. JVP flat. Carotids 2+ bilaterally; no bruits. No lymphadenopathy or thryomegaly appreciated. Cor: PMI normal. Regular rate & rhythm. No rubs, gallops or murmurs. Lungs: clear Abdomen: soft, nontender, nondistended. No hepatosplenomegaly. No bruits or masses. Good bowel sounds. Extremities: no cyanosis, clubbing, rash, R and LLE 1+ edema Neuro: alert & orientedx3, cranial nerves grossly intact. Moves all 4 extremities w/o difficulty. Affect pleasant.  ASSESSMENT & PLAN: [name] is a 78 year old with ischemic cardiomyopathy and chronic systolic heart failure recently admitted with cardiogenic shock that required short term milrinone. 1. Chronic Systolic Heart Failure: Ischemic cardiomyopathy. Has Medtronic ICD. ECHO 01/25/14 EF 40-45%. NYHA II-III. Volume status ok on exam, but Optivol suggests  increasing fluid retention.  - Take lasix  20 daily.   - Continue Add Coreg 3.125 mg bid.  - Continue hydralazine 50 mg every 8 hours/Imdur 30 mg daily - He is not Ace or spiro due to CKD 2. A fib/Aflutter: Maintaining SR.  - Continue amiodarone 200 mg daily. Check TSH, T3, T4 and LFTs. Will need yearly eye exams.  - Continue Eliquis 5 mg bid.  3. Hand Tremors: Resolved. Continue amio 200 mg daily.   4. CKD: Stable III, last creatinine 1.6.  5. DM: Per PCP.  6. CAD: s/p CABG.  Continue pravastatin.  Not on ASA with apixaban use.  7. COPD 8. H/o VT: He has an ICD and is now on amiodarone.  He can stop mexiletine at this point. As above, adding Coreg at  low dose.  9. Possible colon cancer: To followup with Dr. Earlean Shawl.   Followup in 1 month  CLEGG,AMY NP-C  2:21 PM  Patient seen and examined with Darrick Grinder, NP. We discussed all aspects of the encounter. I agree with the assessment and plan as stated above.   He is much improved since his hospitalization. Now NYHA II-III. Volume status ok. Continue current dose of lasix. Reinforced need for daily weights and reviewed use of sliding scale diuretics. Will follow closely. Low threshold to consider milrinone if he decompensates.   Benay Spice 6:49 PM

## 2014-03-30 ENCOUNTER — Encounter (INDEPENDENT_AMBULATORY_CARE_PROVIDER_SITE_OTHER): Payer: Medicare Other | Admitting: Ophthalmology

## 2014-03-30 DIAGNOSIS — H43813 Vitreous degeneration, bilateral: Secondary | ICD-10-CM

## 2014-03-30 DIAGNOSIS — H3531 Nonexudative age-related macular degeneration: Secondary | ICD-10-CM

## 2014-03-30 DIAGNOSIS — E11319 Type 2 diabetes mellitus with unspecified diabetic retinopathy without macular edema: Secondary | ICD-10-CM

## 2014-03-30 DIAGNOSIS — E11329 Type 2 diabetes mellitus with mild nonproliferative diabetic retinopathy without macular edema: Secondary | ICD-10-CM

## 2014-03-30 DIAGNOSIS — I10 Essential (primary) hypertension: Secondary | ICD-10-CM

## 2014-03-30 DIAGNOSIS — H35033 Hypertensive retinopathy, bilateral: Secondary | ICD-10-CM

## 2014-03-30 LAB — HM DIABETES EYE EXAM

## 2014-04-03 ENCOUNTER — Encounter: Payer: Self-pay | Admitting: Family Medicine

## 2014-04-03 DIAGNOSIS — R970 Elevated carcinoembryonic antigen [CEA]: Secondary | ICD-10-CM | POA: Insufficient documentation

## 2014-04-05 ENCOUNTER — Telehealth: Payer: Self-pay | Admitting: Pulmonary Disease

## 2014-04-05 MED ORDER — FLUTICASONE-SALMETEROL 500-50 MCG/DOSE IN AEPB: 1.0000 | INHALATION_SPRAY | Freq: Two times a day (BID) | RESPIRATORY_TRACT | Status: AC

## 2014-04-05 NOTE — Telephone Encounter (Signed)
Refill of the advair has been sent to the pharmacy per pts wife request.  i have called and lmom to make the pt aware.

## 2014-04-06 ENCOUNTER — Encounter: Payer: Self-pay | Admitting: Internal Medicine

## 2014-04-11 ENCOUNTER — Encounter: Payer: Self-pay | Admitting: Family Medicine

## 2014-04-11 ENCOUNTER — Encounter: Payer: Self-pay | Admitting: Cardiology

## 2014-04-28 ENCOUNTER — Encounter: Payer: Self-pay | Admitting: Cardiology

## 2014-04-28 ENCOUNTER — Encounter: Payer: Self-pay | Admitting: Internal Medicine

## 2014-05-01 ENCOUNTER — Encounter (HOSPITAL_COMMUNITY): Payer: Medicare Other

## 2014-05-01 ENCOUNTER — Ambulatory Visit (HOSPITAL_COMMUNITY): Payer: Medicare Other

## 2014-05-04 ENCOUNTER — Encounter (HOSPITAL_COMMUNITY): Payer: Self-pay | Admitting: Internal Medicine

## 2014-05-09 ENCOUNTER — Ambulatory Visit (HOSPITAL_COMMUNITY)
Admission: RE | Admit: 2014-05-09 | Discharge: 2014-05-09 | Disposition: A | Discharge status: Home / Self Care | Payer: Medicare Other | Source: Ambulatory Visit | Attending: Internal Medicine | Admitting: Internal Medicine

## 2014-05-09 ENCOUNTER — Ambulatory Visit (HOSPITAL_BASED_OUTPATIENT_CLINIC_OR_DEPARTMENT_OTHER)
Admission: RE | Admit: 2014-05-09 | Discharge: 2014-05-09 | Disposition: A | Discharge status: Home / Self Care | Payer: Medicare Other | Source: Ambulatory Visit | Attending: Internal Medicine | Admitting: Internal Medicine

## 2014-05-09 VITALS — BP 100/58 | HR 67 | Wt 156.0 lb

## 2014-05-09 DIAGNOSIS — I10 Essential (primary) hypertension: Secondary | ICD-10-CM | POA: Diagnosis not present

## 2014-05-09 DIAGNOSIS — N183 Chronic kidney disease, stage 3 unspecified: Secondary | ICD-10-CM

## 2014-05-09 DIAGNOSIS — R570 Cardiogenic shock: Secondary | ICD-10-CM | POA: Insufficient documentation

## 2014-05-09 DIAGNOSIS — I4892 Unspecified atrial flutter: Secondary | ICD-10-CM | POA: Insufficient documentation

## 2014-05-09 DIAGNOSIS — I472 Ventricular tachycardia: Secondary | ICD-10-CM | POA: Diagnosis not present

## 2014-05-09 DIAGNOSIS — N2889 Other specified disorders of kidney and ureter: Secondary | ICD-10-CM

## 2014-05-09 DIAGNOSIS — I4891 Unspecified atrial fibrillation: Secondary | ICD-10-CM | POA: Diagnosis not present

## 2014-05-09 DIAGNOSIS — I340 Nonrheumatic mitral (valve) insufficiency: Secondary | ICD-10-CM

## 2014-05-09 DIAGNOSIS — I509 Heart failure, unspecified: Secondary | ICD-10-CM | POA: Insufficient documentation

## 2014-05-09 DIAGNOSIS — E119 Type 2 diabetes mellitus without complications: Secondary | ICD-10-CM | POA: Diagnosis not present

## 2014-05-09 DIAGNOSIS — J449 Chronic obstructive pulmonary disease, unspecified: Secondary | ICD-10-CM | POA: Insufficient documentation

## 2014-05-09 DIAGNOSIS — Z87891 Personal history of nicotine dependence: Secondary | ICD-10-CM | POA: Diagnosis not present

## 2014-05-09 DIAGNOSIS — I517 Cardiomegaly: Secondary | ICD-10-CM | POA: Diagnosis not present

## 2014-05-09 DIAGNOSIS — I5022 Chronic systolic (congestive) heart failure: Secondary | ICD-10-CM

## 2014-05-09 DIAGNOSIS — I480 Paroxysmal atrial fibrillation: Secondary | ICD-10-CM

## 2014-05-09 DIAGNOSIS — I5023 Acute on chronic systolic (congestive) heart failure: Secondary | ICD-10-CM

## 2014-05-09 DIAGNOSIS — Z95810 Presence of automatic (implantable) cardiac defibrillator: Secondary | ICD-10-CM | POA: Insufficient documentation

## 2014-05-09 DIAGNOSIS — I059 Rheumatic mitral valve disease, unspecified: Secondary | ICD-10-CM

## 2014-05-09 MED ORDER — METOLAZONE 2.5 MG PO TABS: ORAL_TABLET | ORAL | Status: DC

## 2014-05-09 MED ORDER — FUROSEMIDE 20 MG PO TABS: 40.0000 mg | ORAL_TABLET | Freq: Every day | ORAL | Status: DC

## 2014-05-09 MED ORDER — POTASSIUM CHLORIDE ER 10 MEQ PO TBCR: 20.0000 meq | EXTENDED_RELEASE_TABLET | Freq: Every day | ORAL | Status: DC

## 2014-05-09 NOTE — Progress Notes (Signed)
Patient ID: [name], male   DOB: 03-29-1936, 78 y.o.   MRN: [id number]  PCP: Dr Ernestine Conrad GI: Dr Earlean Shawl.    HPI: [name] is a 78 yo male with a history of HTN, CAD s/p CABG, ICM s/p Medtronic ICD, chronic systolic heart failure, DM2, Vtach, atrial flutter, PVD and GI mass. ECHO (12/22/13) EF 20-25%, Echo  EF 40-45% (9/15)  Mod-severe MR.   On September 1st he was admitted from Dr. Rosezella [place] office with volume overload and low output heart failure. Creatinine was 2.8 on admit (baseline 1.6). Central line was placed to monitor CVP and CO-OX. Initial CO-OX was 49% and he was started on Milrinone and IV lasix. Discharge weight was 151 pounds. Creatinine was 1.42.   Saw [name] recently for possible colon mass/thickening and felt to be result of previous diverticulitis.   Follow-up: Returns with his wife for f/u. Feels pretty good. Back is hurting him. Weight at home has been stable around 149-151. Breathing ok. BP remains low. Gets dizzy occasioanlly if he gets up fast. Mild edema at night - may be getting worse. No orthopnea/PND. Not watching salt.   Echo today EF ~40% inferior AK moderate to severe MR   ICD interrogation: Optivol Fluid index very high since 10/13. No AF/VT. Activity 3h/day  Labs 02/09/14 K 3.9 Creatinine 1.38 Pro BNP 2555  Labs 02/20/14 K 3.5 NA 127 Creatinine 1.6  03/01/14 TSH 19.8  T4  1.03 T3 84  Labs 03/03/14 CEA 9.0  K 4.3 creatinine 1.2 NA 134 Hgb 12.7 TSH 19.8 T3 84.9 T4 1.03   CT Virtual Colonoscopy- Diverticulosis ? Rectosigmoid Colon Ca  ROS: All systems negative except as listed in HPI, PMH and Problem List.  Past Medical History  Diagnosis Date  . VENTRICULAR TACHYCARDIA   . PERIPHERAL VASCULAR DISEASE   . HYPERTENSION     Hypertensive retinopathy-grade II OU  . HYPERCHOLESTEROLEMIA   . CORONARY ARTERY DISEASE     Hx of CABG  . Chronic systolic heart failure     Ischemic cardiomyopathy (01/2014 EF 40-45% with hypokinesis + small area of  akinesis  . CAROTID ARTERY DISEASE   . SEBORRHEIC DERMATITIS   . Chronic renal insufficiency, stage III (moderate)     CrCl 40s-50s  . DIVERTICULOSIS OF COLON   . COPD, severe   . COLONIC POLYPS   . ANXIETY   . ICD (implantable cardiac defibrillator) in place   . Type II diabetes mellitus     +background diabetic retinopathy OU  . Chronic lower back pain   . DJD (degenerative joint disease)     "hands" (05/24/2012)  . Osteoarthritis of finger   . History of atrial flutter   . Thrombus 12/2013    on ICD lead--started anticoag and his cardioversion for atrial flutter was postponed.  . Colon stricture 2015    with features worrisome for mass, also with recent rising CEA level (possible colon cancer)-GI MD= Dr. Franki Cabot recent eval/follow up with Dr. Earlean Shawl was summer 2014, at which time he recommended colonoscopy but pt declined.  . Macular degeneration, age related, nonexudative     OU    Current Outpatient Prescriptions  Medication Sig Dispense Refill  . amiodarone (PACERONE) 200 MG tablet Take 1 tablet (200 mg total) by mouth daily. 30 tablet 6  . apixaban (ELIQUIS) 5 MG TABS tablet Take 1 tablet (5 mg total) by mouth 2 (two) times daily. 60 tablet 6  . carvedilol (COREG) 3.125 MG tablet Take 1  tablet (3.125 mg total) by mouth 2 (two) times daily with a meal. 60 tablet 6  . DiphenhydrAMINE HCl, Sleep, 50 MG CAPS Take 50 mg by mouth at bedtime as needed.    . Fluticasone-Salmeterol (ADVAIR) 500-50 MCG/DOSE AEPB Inhale 1 puff into the lungs 2 (two) times daily. 60 each 11  . furosemide (LASIX) 20 MG tablet Take 1 tablet (20 mg total) by mouth daily. May take additional 20 mg if weight is greater than 154lbs 45 tablet 6  . Guaifenesin (MUCINEX MAXIMUM STRENGTH) 1200 MG TB12 Take 1,200 mg by mouth 2 (two) times daily.     . hydrALAZINE (APRESOLINE) 50 MG tablet Take 1 tablet (50 mg total) by mouth every 8 (eight) hours. 90 tablet 6  . insulin aspart (NOVOLOG FLEXPEN) 100 UNIT/ML  FlexPen Inject as directed by MD SQ with each meal 15 mL 11  . Insulin Glargine (LANTUS SOLOSTAR) 100 UNIT/ML Solostar Pen 10 units SQ qhs 15 mL 11  . isosorbide mononitrate (IMDUR) 30 MG 24 hr tablet Take 1 tablet (30 mg total) by mouth daily. 30 tablet 6  . pravastatin (PRAVACHOL) 80 MG tablet Take 1 tablet (80 mg total) by mouth daily. 30 tablet 10  . temazepam (RESTORIL) 7.5 MG capsule 1-2 tabs po qhs prn insomnia 30 capsule 1  . tiotropium (SPIRIVA) 18 MCG inhalation capsule Place 18 mcg into inhaler and inhale every evening. Between 4 & 6 pm     No current facility-administered medications for this encounter.     PHYSICAL EXAM: Filed Vitals:   05/09/14 1005  BP: 100/58  Pulse: 67  Weight: 156 lb (70.761 kg)  SpO2: 97%    General:  Well appearing. No resp difficulty HEENT: normal Neck: supple. JVP 6-7. Carotids 2+ bilaterally; no bruits. No lymphadenopathy or thryomegaly appreciated. Cor: PMI normal. Regular rate & rhythm. No rubs, gallops or murmurs. Lungs: clear Abdomen: soft, nontender, nondistended. No hepatosplenomegaly. No bruits or masses. Good bowel sounds. Extremities: no cyanosis, clubbing, rash, R and LLE 2+ edema Neuro: alert & orientedx3, cranial nerves grossly intact. Moves all 4 extremities w/o difficulty. Affect pleasant.  ASSESSMENT & PLAN: Mr [name] is a 78 year old with ischemic cardiomyopathy and chronic systolic heart failure recently admitted with cardiogenic shock that required short term milrinone. 1. Chronic Systolic Heart Failure: Ischemic cardiomyopathy. Has Medtronic ICD. ECHO today stable EF 40%. NYHA II-III.  -Volume status is significantly elevated despite only small increase in weight. (confirmed by Optivol on ICD interrogation) Likely due to dietary non-compliance. Will increase lasix to 40 daily and add kcl 20. One dose metolazone today. BMET 1 week and 3 week. Consulted on need for dietary compliance and salt restriction. - Continue  hydralazine 50 mg every 8 hours/Imdur 30 mg daily - He is not Ace or spiro in past due to CKD. Can reconsider in future if BP allows.  2. A fib/Aflutter: Maintaining SR.  - Continue amiodarone 200 mg daily. Recent LFTs ok. Will need yearly eye exams.  - Continue Eliquis 5 mg bid.  3. Mitral regurgitation - moderate to severe. Discussed possible TEE to further evaluate but I dont think he is good surgical candidate. Will continue to follow. I will review with Dr. Aundra Dubin as well.  4. CKD: Stable III, 5. DM: Per PCP.  6. CAD: s/p CABG.  Continue pravastatin.  Not on ASA with apixaban use.  7. COPD 8. H/o VT: He has an ICD and is now on amiodarone.  Stable  Followup in 1  month  Glori Bickers MD  10:33 AM

## 2014-05-09 NOTE — Progress Notes (Signed)
*  PRELIMINARY RESULTS* Echocardiogram 2D Echocardiogram has been performed.  Leavy Cella 05/09/2014, 9:49 AM

## 2014-05-09 NOTE — Patient Instructions (Signed)
INCREASE Lasix to 40 mg daily START Potassium 20 meQ daily TAKE Metolazone 2.5 mg today to help with increased fluid level  Labs needed in 1 week and again in 3 weeks  Your physician recommends that you schedule a follow-up appointment in: 4 weeks  Do the following things EVERYDAY: 1) Weigh yourself in the morning before breakfast. Write it down and keep it in a log. 2) Take your medicines as prescribed 3) Eat low salt foods-Limit salt (sodium) to 2000 mg per day.  4) Stay as active as you can everyday 5) Limit all fluids for the day to less than 2 liters 6)

## 2014-05-11 ENCOUNTER — Encounter: Payer: Self-pay | Admitting: Family Medicine

## 2014-05-11 ENCOUNTER — Ambulatory Visit (INDEPENDENT_AMBULATORY_CARE_PROVIDER_SITE_OTHER): Payer: Medicare Other | Admitting: Family Medicine

## 2014-05-11 VITALS — BP 123/70 | HR 61 | Temp 97.6°F | Resp 16 | Ht 66.5 in | Wt 152.0 lb

## 2014-05-11 DIAGNOSIS — R7989 Other specified abnormal findings of blood chemistry: Secondary | ICD-10-CM

## 2014-05-11 DIAGNOSIS — E118 Type 2 diabetes mellitus with unspecified complications: Secondary | ICD-10-CM

## 2014-05-11 DIAGNOSIS — R946 Abnormal results of thyroid function studies: Secondary | ICD-10-CM

## 2014-05-11 DIAGNOSIS — K56699 Other intestinal obstruction unspecified as to partial versus complete obstruction: Secondary | ICD-10-CM

## 2014-05-11 DIAGNOSIS — I2510 Atherosclerotic heart disease of native coronary artery without angina pectoris: Secondary | ICD-10-CM

## 2014-05-11 DIAGNOSIS — Z23 Encounter for immunization: Secondary | ICD-10-CM

## 2014-05-11 DIAGNOSIS — K5669 Other intestinal obstruction: Secondary | ICD-10-CM

## 2014-05-11 LAB — TSH: TSH: 25.09 u[IU]/mL — AB (ref 0.35–4.50)

## 2014-05-11 LAB — HEMOGLOBIN A1C: HEMOGLOBIN A1C: 5.9 % (ref 4.6–6.5)

## 2014-05-11 LAB — T4, FREE: Free T4: 0.8 ng/dL (ref 0.60–1.60)

## 2014-05-11 NOTE — Progress Notes (Signed)
Pre visit review using our clinic review tool, if applicable. No additional management support is needed unless otherwise documented below in the visit note. 

## 2014-05-11 NOTE — Progress Notes (Addendum)
OFFICE NOTE  05/12/2014  CC:  Chief Complaint  [name] presents with  . Follow-up     HPI: [name] is a 78 y.o. Caucasian male who is here for 2 mo f/u DM 2.   Glucoses: fasting am100-120. Later in day 150s.  One reading in 70s, he felt hypoglycemic.   He realized he did not eat well after taking normal dose of insulin. He guesses at some glucoses, admits he forgets to check later in day many days.   Mealtime insulin: 7-8-9.  Bedtime insulin 10 units. He occ feels some tingling sensation in feet but no numbness and no pain.  No hx of diabetic foot ulcer.  Of note, regarding his Chronic systolic heart failure, he saw Dr. Sung Amabile today and his lasix was increased to 40mg  qd due to volume overload, 20 mEQ KCL added, also metolazone x 1 dose x 1 day added.  He has been maintaining SR.  He is being continued on amiodarone and eliquis.  Dr. Earlean Shawl (GI) review: he felt that since he has had this colon change for the last 12 years and it hasn't changed then he felt it was likely a leftover scar/result of diverticulitis and was therefore benign and he recommended that he just let it go and pursue no further diagnostics/no surgery.   Pertinent PMH:  Past medical, surgical, social, and family history reviewed and no changes are noted since last office visit.  MEDS:  Outpatient Prescriptions Prior to Visit  Medication Sig Dispense Refill  . amiodarone (PACERONE) 200 MG tablet Take 1 tablet (200 mg total) by mouth daily. 30 tablet 6  . apixaban (ELIQUIS) 5 MG TABS tablet Take 1 tablet (5 mg total) by mouth 2 (two) times daily. 60 tablet 6  . carvedilol (COREG) 3.125 MG tablet Take 1 tablet (3.125 mg total) by mouth 2 (two) times daily with a meal. 60 tablet 6  . Fluticasone-Salmeterol (ADVAIR) 500-50 MCG/DOSE AEPB Inhale 1 puff into the lungs 2 (two) times daily. 60 each 11  . furosemide (LASIX) 20 MG tablet Take 2 tablets (40 mg total) by mouth daily. May take additional 20 mg if weight is  greater than 154lbs 60 tablet 6  . Guaifenesin (MUCINEX MAXIMUM STRENGTH) 1200 MG TB12 Take 1,200 mg by mouth 2 (two) times daily.     . hydrALAZINE (APRESOLINE) 50 MG tablet Take 1 tablet (50 mg total) by mouth every 8 (eight) hours. 90 tablet 6  . insulin aspart (NOVOLOG FLEXPEN) 100 UNIT/ML FlexPen Inject as directed by MD SQ with each meal 15 mL 11  . Insulin Glargine (LANTUS SOLOSTAR) 100 UNIT/ML Solostar Pen 10 units SQ qhs 15 mL 11  . isosorbide mononitrate (IMDUR) 30 MG 24 hr tablet Take 1 tablet (30 mg total) by mouth daily. 30 tablet 6  . metolazone (ZAROXOLYN) 2.5 MG tablet Take 2.5 mg today 1 tablet 0  . potassium chloride (K-DUR) 10 MEQ tablet Take 2 tablets (20 mEq total) by mouth daily. 60 tablet 3  . pravastatin (PRAVACHOL) 80 MG tablet Take 1 tablet (80 mg total) by mouth daily. 30 tablet 10  . tiotropium (SPIRIVA) 18 MCG inhalation capsule Place 18 mcg into inhaler and inhale every evening. Between 4 & 6 pm    . DiphenhydrAMINE HCl, Sleep, 50 MG CAPS Take 50 mg by mouth at bedtime as needed.    . temazepam (RESTORIL) 7.5 MG capsule 1-2 tabs po qhs prn insomnia ([name] not taking: Reported on 05/11/2014) 30 capsule 1   No  facility-administered medications prior to visit.    PE: Blood pressure 123/70, pulse 61, temperature 97.6 F (36.4 C), temperature source Temporal, resp. rate 16, height 5' 6.5" (1.689 m), weight 152 lb (68.947 kg), SpO2 95 %. Gen: Alert, well appearing.  [name] is oriented to person, place, time, and situation. Foot exam -  no swelling, tenderness or skin or vascular lesions. Mild rubor to lateral aspect of MTP joints bilaterally.  Color and temperature is normal. Sensation is mildly diminished in all areas tested with monofilament. Peripheral pulses are palpable. Toenails are normal.   IMPRESSION AND PLAN:  1) DM 2, control improving a bit. Encouraged better compliance with monitoring. Feet exam + symptoms suggest mild DPN. Check HbA1c today. Tdap  today.  2) Elevated TSH in hospital 02/2014: I neglected to f/u on these labs due to the excess of things we were focusing on at last f/u visit.  Today we'll recheck TSH and also check T4 and T3.    FOLLOW UP: 4 mo f/u DM 2

## 2014-05-12 ENCOUNTER — Other Ambulatory Visit: Payer: Self-pay | Admitting: Family Medicine

## 2014-05-12 LAB — T3: T3, Total: 146.4 ng/dL (ref 80.0–204.0)

## 2014-05-12 MED ORDER — LEVOTHYROXINE SODIUM 75 MCG PO TABS: 75.0000 ug | ORAL_TABLET | Freq: Every day | ORAL | Status: DC

## 2014-05-17 ENCOUNTER — Other Ambulatory Visit: Payer: Medicare Other

## 2014-06-06 ENCOUNTER — Encounter: Payer: Self-pay | Admitting: Internal Medicine

## 2014-06-07 ENCOUNTER — Encounter (HOSPITAL_COMMUNITY): Payer: Self-pay

## 2014-06-07 ENCOUNTER — Ambulatory Visit (HOSPITAL_COMMUNITY)
Admission: RE | Admit: 2014-06-07 | Discharge: 2014-06-07 | Disposition: A | Discharge status: Home / Self Care | Payer: Medicare Other | Source: Ambulatory Visit | Attending: Internal Medicine | Admitting: Internal Medicine

## 2014-06-07 VITALS — BP 110/48 | HR 76 | Resp 18 | Wt 154.2 lb

## 2014-06-07 DIAGNOSIS — I4891 Unspecified atrial fibrillation: Secondary | ICD-10-CM | POA: Diagnosis not present

## 2014-06-07 DIAGNOSIS — I2510 Atherosclerotic heart disease of native coronary artery without angina pectoris: Secondary | ICD-10-CM | POA: Insufficient documentation

## 2014-06-07 DIAGNOSIS — Z951 Presence of aortocoronary bypass graft: Secondary | ICD-10-CM | POA: Insufficient documentation

## 2014-06-07 DIAGNOSIS — N183 Chronic kidney disease, stage 3 (moderate): Secondary | ICD-10-CM | POA: Insufficient documentation

## 2014-06-07 DIAGNOSIS — I340 Nonrheumatic mitral (valve) insufficiency: Secondary | ICD-10-CM | POA: Diagnosis not present

## 2014-06-07 DIAGNOSIS — R0600 Dyspnea, unspecified: Secondary | ICD-10-CM

## 2014-06-07 DIAGNOSIS — E119 Type 2 diabetes mellitus without complications: Secondary | ICD-10-CM | POA: Diagnosis not present

## 2014-06-07 DIAGNOSIS — J449 Chronic obstructive pulmonary disease, unspecified: Secondary | ICD-10-CM | POA: Insufficient documentation

## 2014-06-07 DIAGNOSIS — I5022 Chronic systolic (congestive) heart failure: Secondary | ICD-10-CM | POA: Diagnosis present

## 2014-06-07 LAB — BASIC METABOLIC PANEL
ANION GAP: 11 (ref 5–15)
BUN: 17 mg/dL (ref 6–23)
CHLORIDE: 102 meq/L (ref 96–112)
CO2: 25 mmol/L (ref 19–32)
CREATININE: 1.35 mg/dL (ref 0.50–1.35)
Calcium: 9.2 mg/dL (ref 8.4–10.5)
GFR calc non Af Amer: 49 mL/min — ABNORMAL LOW (ref >90)
GFR, EST AFRICAN AMERICAN: 57 mL/min — AB (ref >90)
Glucose, Bld: 103 mg/dL — ABNORMAL HIGH (ref 70–99)
POTASSIUM: 4.2 mmol/L (ref 3.5–5.1)
Sodium: 138 mmol/L (ref 135–145)

## 2014-06-07 LAB — BRAIN NATRIURETIC PEPTIDE: B Natriuretic Peptide: 528.4 pg/mL — ABNORMAL HIGH (ref 0.0–100.0)

## 2014-06-07 MED ORDER — CARVEDILOL 6.25 MG PO TABS: 6.2500 mg | ORAL_TABLET | Freq: Two times a day (BID) | ORAL | Status: DC

## 2014-06-07 MED ORDER — METOLAZONE 2.5 MG PO TABS: 2.5000 mg | ORAL_TABLET | ORAL | Status: DC

## 2014-06-07 NOTE — Patient Instructions (Signed)
Increase Carvedilol to 6.25 mg Twice daily   Take Metolazone 2.5 mg every Monday  Labs today and in 3 weeks  Your physician recommends that you schedule a follow-up appointment in: 6 weeks

## 2014-06-07 NOTE — Progress Notes (Signed)
Patient ID: CHEVELLE DURR, male   DOB: [birth year], 79 y.o.   MRN: [id number]  PCP: Dr Ernestine Conrad GI: Dr Earlean Shawl.    HPI: Mr. Schloemer is a 79 yo male with a history of HTN, CAD s/p CABG, ICM s/p Medtronic ICD, chronic systolic heart failure, DM2, Vtach, atrial flutter, PVD and GI mass. ECHO (12/22/13) EF 20-25%, Echo  EF 40% (12/15)  Mod-severe MR.   On September 1st he was admitted from Dr. Rosezella [place] office with volume overload and low output heart failure. Creatinine was 2.8 on admit (baseline 1.6). Central line was placed to monitor CVP and CO-OX. Initial CO-OX was 49% and he was started on Milrinone and IV lasix. Discharge weight was 151 pounds. Creatinine was 1.42.   Saw Jeff Medoff  for possible colon mass/thickening and felt to be result of previous diverticulitis.   Follow-up: Returns with his wife for f/u. At last visit volume status was up. Increased lasix to 40mg  daily and gave one metolazone. Feels good. Back is hurting him. Weight at home has been stable around 150. Breathing ok. BP remains low. No edema/orthopnea/PND. Watching salt more closely.  Echo 12/15 EF  ~40% inferior AK moderate to severe MR. RV mildly HK   ICD interrogation: Optivol Fluid index very high since 10/13. No AF/VT. Activity 3h/day  Labs 02/09/14 K 3.9 Creatinine 1.38 Pro BNP 2555  Labs 02/20/14 K 3.5 NA 127 Creatinine 1.6  03/01/14 TSH 19.8  T4  1.03 T3 84  Labs 03/03/14 CEA 9.0  K 4.3 creatinine 1.2 NA 134 Hgb 12.7 TSH 19.8 T3 84.9 T4 1.03   CT Virtual Colonoscopy- Diverticulosis ? Rectosigmoid Colon Ca  ROS: All systems negative except as listed in HPI, PMH and Problem List.  Past Medical History  Diagnosis Date  . VENTRICULAR TACHYCARDIA   . PERIPHERAL VASCULAR DISEASE   . HYPERTENSION     Hypertensive retinopathy-grade II OU  . HYPERCHOLESTEROLEMIA   . CORONARY ARTERY DISEASE     Hx of CABG  . Chronic systolic heart failure     Ischemic cardiomyopathy (01/2014 EF 40-45% with hypokinesis + small  area of akinesis  . CAROTID ARTERY DISEASE   . SEBORRHEIC DERMATITIS   . Chronic renal insufficiency, stage III (moderate)     CrCl 40s-50s  . DIVERTICULOSIS OF COLON   . COPD, severe   . COLONIC POLYPS   . ANXIETY   . ICD (implantable cardiac defibrillator) in place   . Type II diabetes mellitus     +background diabetic retinopathy OU  . Chronic lower back pain   . DJD (degenerative joint disease)     "hands" (05/24/2012)  . Osteoarthritis of finger   . History of atrial flutter   . Thrombus 12/2013    on ICD lead--started anticoag and his cardioversion for atrial flutter was postponed.  . Colon stricture 2015    with features worrisome for mass, also with recent rising CEA level (possible colon cancer)-GI MD= Dr. Franki Cabot recent eval/follow up with Dr. Earlean Shawl was summer 2014, at which time he recommended colonoscopy but pt declined.  . Macular degeneration, age related, nonexudative     OU    Current Outpatient Prescriptions  Medication Sig Dispense Refill  . amiodarone (PACERONE) 200 MG tablet Take 1 tablet (200 mg total) by mouth daily. 30 tablet 6  . apixaban (ELIQUIS) 5 MG TABS tablet Take 1 tablet (5 mg total) by mouth 2 (two) times daily. 60 tablet 6  . carvedilol (COREG) 3.125 MG tablet  Take 1 tablet (3.125 mg total) by mouth 2 (two) times daily with a meal. 60 tablet 6  . Fluticasone-Salmeterol (ADVAIR) 500-50 MCG/DOSE AEPB Inhale 1 puff into the lungs 2 (two) times daily. 60 each 11  . furosemide (LASIX) 20 MG tablet Take 2 tablets (40 mg total) by mouth daily. May take additional 20 mg if weight is greater than 154lbs 60 tablet 6  . Guaifenesin (MUCINEX MAXIMUM STRENGTH) 1200 MG TB12 Take 1,200 mg by mouth 2 (two) times daily.     . hydrALAZINE (APRESOLINE) 50 MG tablet Take 1 tablet (50 mg total) by mouth every 8 (eight) hours. 90 tablet 6  . insulin aspart (NOVOLOG FLEXPEN) 100 UNIT/ML FlexPen Inject as directed by MD SQ with each meal 15 mL 11  . Insulin Glargine  (LANTUS SOLOSTAR) 100 UNIT/ML Solostar Pen 10 units SQ qhs 15 mL 11  . isosorbide mononitrate (IMDUR) 30 MG 24 hr tablet Take 1 tablet (30 mg total) by mouth daily. 30 tablet 6  . levothyroxine (SYNTHROID, LEVOTHROID) 75 MCG tablet Take 1 tablet (75 mcg total) by mouth daily. 30 tablet 3  . potassium chloride (K-DUR) 10 MEQ tablet Take 2 tablets (20 mEq total) by mouth daily. 60 tablet 3  . pravastatin (PRAVACHOL) 80 MG tablet Take 1 tablet (80 mg total) by mouth daily. 30 tablet 10  . tiotropium (SPIRIVA) 18 MCG inhalation capsule Place 18 mcg into inhaler and inhale every evening. Between 4 & 6 pm     No current facility-administered medications for this encounter.     PHYSICAL EXAM: Filed Vitals:   06/07/14 1350  BP: 110/48  Pulse: 76  Resp: 18  Weight: 154 lb 4 oz (69.967 kg)  SpO2: 94%    General:  Well appearing. No resp difficulty HEENT: normal Neck: supple. JVP 7. Carotids 2+ bilaterally; no bruits. No lymphadenopathy or thryomegaly appreciated. Cor: PMI normal. Regular rate & rhythm. No rubs, gallops or murmurs. Lungs: clear Abdomen: soft, nontender, nondistended. No hepatosplenomegaly. No bruits or masses. Good bowel sounds. Extremities: no cyanosis, clubbing, rash, R and LLE 1+ edema Neuro: alert & orientedx3, cranial nerves grossly intact. Moves all 4 extremities w/o difficulty. Affect pleasant.  ICD interrogation shows Optivol maxed out (doubt this is accurate). No VT/AF. Activity 3 hours  ASSESSMENT & PLAN: Mr Garrel Ridgel is a 79 year old with ischemic cardiomyopathy and chronic systolic heart failure recently admitted with cardiogenic shock that required short term milrinone. 1. Chronic Systolic Heart Failure: Ischemic cardiomyopathy. Has Medtronic ICD. ECHO 12/15 EF 40%. NYHA II-III.  -Volume status remains mildly elevated. -Will continue lasix to 40 daily and add kcl 20. Add metolazone 2.5 qMonday - Increase carvedilol to 6.25 bid - Continue hydralazine 50 mg  every 8 hours/Imdur 30 mg daily - He is not Ace or spiro in past due to CKD. Can reconsider in future if BP allows.  2. A fib/Aflutter: Maintaining SR.  - Continue amiodarone 200 mg daily. Recent LFTs ok. Will need yearly eye exams.  - Continue Eliquis 5 mg bid.  3. Mitral regurgitation - moderate to severe due to restriction of posterior MV leaflet. We discussed the possibility of TEE and possible repair. However, I think a major surgery like that could potentially be a major setback for him especially with his COPD. He is currently doing quite well. We will follow closely with surveillance echos and if LV dilating then will plan TEE to further evaluate.  4. CKD: Stable III. Check labs today.  5. DM:  Per PCP.  6. CAD: s/p CABG.  Continue pravastatin.  Not on ASA with apixaban use.  7. COPD 8. H/o VT: He has an ICD and is now on amiodarone.  Stable  Followup in 1 month  Glori Bickers MD  1:57 PM

## 2014-06-15 ENCOUNTER — Ambulatory Visit (INDEPENDENT_AMBULATORY_CARE_PROVIDER_SITE_OTHER): Payer: Medicare Other | Admitting: *Deleted

## 2014-06-15 DIAGNOSIS — I5022 Chronic systolic (congestive) heart failure: Secondary | ICD-10-CM

## 2014-06-15 DIAGNOSIS — I255 Ischemic cardiomyopathy: Secondary | ICD-10-CM

## 2014-06-15 LAB — MDC_IDC_ENUM_SESS_TYPE_REMOTE
Battery Voltage: 3.01 V
Brady Statistic RV Percent Paced: 0.02 %
Date Time Interrogation Session: 20160121131049
HIGH POWER IMPEDANCE MEASURED VALUE: 67 Ohm
Lead Channel Impedance Value: 285 Ohm
Lead Channel Impedance Value: 323 Ohm
Lead Channel Pacing Threshold Amplitude: 1.125 V
MDC IDC MSMT BATTERY REMAINING LONGEVITY: 125 mo
MDC IDC MSMT LEADCHNL RV PACING THRESHOLD PULSEWIDTH: 0.4 ms
MDC IDC MSMT LEADCHNL RV SENSING INTR AMPL: 16.625 mV
MDC IDC MSMT LEADCHNL RV SENSING INTR AMPL: 16.625 mV
MDC IDC SET LEADCHNL RV PACING AMPLITUDE: 2.25 V
MDC IDC SET LEADCHNL RV PACING PULSEWIDTH: 0.4 ms
MDC IDC SET LEADCHNL RV SENSING SENSITIVITY: 0.3 mV
MDC IDC SET ZONE DETECTION INTERVAL: 300 ms
Zone Setting Detection Interval: 430 ms
Zone Setting Detection Interval: 450 ms

## 2014-06-15 NOTE — Progress Notes (Signed)
Remote ICD transmission.   

## 2014-06-19 ENCOUNTER — Encounter: Payer: Self-pay | Admitting: Internal Medicine

## 2014-06-27 ENCOUNTER — Encounter: Payer: Self-pay | Admitting: Cardiology

## 2014-06-30 ENCOUNTER — Encounter: Payer: Self-pay | Admitting: Internal Medicine

## 2014-07-03 ENCOUNTER — Ambulatory Visit (INDEPENDENT_AMBULATORY_CARE_PROVIDER_SITE_OTHER): Payer: Medicare Other | Admitting: Pulmonary Disease

## 2014-07-03 ENCOUNTER — Other Ambulatory Visit (INDEPENDENT_AMBULATORY_CARE_PROVIDER_SITE_OTHER): Payer: Medicare Other

## 2014-07-03 ENCOUNTER — Encounter: Payer: Self-pay | Admitting: Pulmonary Disease

## 2014-07-03 VITALS — BP 124/62 | HR 57 | Temp 96.6°F | Ht 66.5 in | Wt 151.0 lb

## 2014-07-03 DIAGNOSIS — IMO0002 Reserved for concepts with insufficient information to code with codable children: Secondary | ICD-10-CM

## 2014-07-03 DIAGNOSIS — E7800 Pure hypercholesterolemia, unspecified: Secondary | ICD-10-CM

## 2014-07-03 DIAGNOSIS — I779 Disorder of arteries and arterioles, unspecified: Secondary | ICD-10-CM

## 2014-07-03 DIAGNOSIS — I483 Typical atrial flutter: Secondary | ICD-10-CM

## 2014-07-03 DIAGNOSIS — I10 Essential (primary) hypertension: Secondary | ICD-10-CM

## 2014-07-03 DIAGNOSIS — E1165 Type 2 diabetes mellitus with hyperglycemia: Secondary | ICD-10-CM

## 2014-07-03 DIAGNOSIS — I255 Ischemic cardiomyopathy: Secondary | ICD-10-CM

## 2014-07-03 DIAGNOSIS — J449 Chronic obstructive pulmonary disease, unspecified: Secondary | ICD-10-CM

## 2014-07-03 DIAGNOSIS — I5022 Chronic systolic (congestive) heart failure: Secondary | ICD-10-CM

## 2014-07-03 DIAGNOSIS — I2510 Atherosclerotic heart disease of native coronary artery without angina pectoris: Secondary | ICD-10-CM

## 2014-07-03 DIAGNOSIS — Z95810 Presence of automatic (implantable) cardiac defibrillator: Secondary | ICD-10-CM

## 2014-07-03 DIAGNOSIS — I739 Peripheral vascular disease, unspecified: Secondary | ICD-10-CM

## 2014-07-03 DIAGNOSIS — J929 Pleural plaque without asbestos: Secondary | ICD-10-CM

## 2014-07-03 LAB — BASIC METABOLIC PANEL
BUN: 29 mg/dL — AB (ref 6–23)
CALCIUM: 9.2 mg/dL (ref 8.4–10.5)
CO2: 31 meq/L (ref 19–32)
Chloride: 99 mEq/L (ref 96–112)
Creatinine, Ser: 1.49 mg/dL (ref 0.40–1.50)
GFR: 48.62 mL/min — AB (ref >60.00)
Glucose, Bld: 117 mg/dL — ABNORMAL HIGH (ref 70–99)
POTASSIUM: 3.5 meq/L (ref 3.5–5.1)
SODIUM: 136 meq/L (ref 135–145)

## 2014-07-03 NOTE — Patient Instructions (Signed)
Today we updated your med list in our EPIC system...    Continue your current medications the same...  Today we rechecked your Metabolic panel & we'll send a copy to DrBensimhon...    We will contact you w/ the results when available...   START THE EXERCISE PROGRAM ALREADY!!!  Call for any questions...  Let's plan a follow up visit in 72mo, sooner if needed for problems.Marland KitchenMarland Kitchen

## 2014-07-03 NOTE — Progress Notes (Addendum)
Subjective:     Patient ID: [name], male   DOB: [birth year], 79 y.o.   MRN: [id number]  HPI 79 y/o WM here for a follow up visit... he has multiple medical problems as noted below...  ~  SEE PREV EPIC NOTES FOR OLDER DATA >>   ~  November 15, 2012:  95moROV & RDashanis c/o constipation, plus he is long overdue for f/u colon w/ hx adenomatous polyps; we decided to check KUB XRay=> air & stool scat about the colon but stool burden NOT substantially increased; we rec MIRALAX daily  & refer to GI as noted...  We reviewed the following medical problems during today's office visit >>     AB, COPD> on Advair500Bid, Xopenex prn, Mucinex-2Bid, off Pred; prev exac treated w/ Augmentin & Pred- improved; now stable w/ min cough, stable DOE etc...    HBP> on Coreg12.5Bid, Vasotec5Bid, Lasix40; BP= 120/70 & he feels the Lasix is too strong- labs show BUN=35 Cr=1.6 therefore rec decr to 1/2 tab daily (282m...     CAD> on ASA325; Hx Lmain + 3vessel dis & CABGx4 in 2003; f/u cath 2008 w/ atretic LIMA to LAD; he denies CP, palpit, dizzy, ch in SOB, edema...    CHF, Ischemic Cardiomyop> EF= 35-40% by 2DEcho 2/13; he continues regular f/u w/ DrHochrein...    Hx VTach, AICD> on Mexiletine150Bid; he is followed by DrKlein/Taylor & stable on Mexiletine- generator changed 12/13, seen 3/14 & stable, they incr Lasix to 40 but he feels it's too strong.    ASPVDz> on ASA325; Hx PTA to right SFA for ABI=0.36 in past; not active enough for claud; known plaque in Carotids w/o signif flow reduction...    CHOL> on Prav80, Feno160; FLP 6/14 showed TChol 69, TG 169, HDL 15, LDL 21; rec to continue meds better low fat diet, incr exerc...    DM> on Glucovance2.5-500 (oneQam & 1/2Qpm); labs today showed BS=111, A1c=6.9 and asked to get on better low carb diet!    GI- Divertics, Polyps> hx adenomatous polyp in 1997, [facility]; he is overdue for f/u colon & c/o constip=> KUB w/o impaction,  start Miralax, refer to GI...    Renal Insuffic> baseline Creat= 1.4-1.5; labs today reveals BUN=35, Creat=1,6, K=5.6; he is rec to decr the Lasix40 back to 2042m...    DJD> followed by DrWainer w/ shots in shoulders in the past; now c/o LBP & we will Rx w/ rest, heat, Tramadol, Robaxin & refer to DrWainer...    Anxiety> aware- he is not on an anxiolytic med by his pref... We reviewed prob list, meds, xrays and labs> see below for updates >>   KUB XRay 6/14 showed air & stool scat about the colon but stool burden NOT substantially increased, DJD in TSpine...   LABS 6/14:  FLP- TChol&LDL are ok but HDL=15, TG=169;  Chems- showed BS=111 A1c=6.9, K=5.6 BUN=35 Cr=1.6;  CBC- ok;  TSH=2.10;  PSA=0.45...  ~  May 09, 2013:  11mo58mo & [name] for GI f/u & felt to be hi risk for colonoscopy; Virtual CT Colon was done 7/14 finding> stricture of the rectosigmoid w/ asymmetrical thickening of the rectosigmoid colon at 30 cm from the anal verge, worrisome for sigmoid colon carcinoma, 5 mm polyp at 8-10 cm from the anal verge, diverticulosis as well with multiple diverticula within the rectosigmoid colon; Probable noncalcified pleural plaques at both lung bases; Degenerative disc disease diffusely throughout the  lumbar spine with anterolisthesis of L3 on L4 by 5 mm most likely degenerative... He was [facility] 7[facility]14> he rec surgery but he is obviously high risk he sent him to [facility] for clearance>>    He saw DrTaylor last 3/14> ischemic cardiomyop, chr sys CHF, Hx VT on Mexiletine150Bid & s/p ICD placement; norm device function, no shocks, Lasix incr to 75m/d, no salt etc...     He saw DrHochrein 7/14> CAD (on ASA325), ischemic cardiomyop, denies CP/ palpit, but weak & SOB, BP was low after virtual colon w/ diarrhea; he stopped his Lasix & decr Coreg to 6.25Bid and Lisin to 2.5Bid...     They did Myoview 8/14> LBBB on EKG, medium sized fixed infer wall defect c/w old IWMI, no reversible  ischemia noted, EF=34% w/ infer wall HK...     [facility]> pt states that he made up his own mind against surg, BMs are better on metamucil daily We reviewed prob list, meds, xrays and labs> see below for updates >> Given 2014 Flu vaccine today...   LABS 12/14:  Chems ok x BS=129, A1c=6.9, Cr=1.7;  LFTs are now normal off all Etoh;  CBC- ok w/ Hg=13.7, Fe=75 (15%);  CEA=5.3..Marland KitchenMarland Kitchen PET Scan => done 12/14 => focal thickening & assoc narrowing of sigmoid colon w/ abnormal hypermetabolism (colon ca vs inflamm), stool in colon, atherosclerotic vasc dis, asbestos related pleural dis bilat...  ADDENDUM>> pt understands options for 1) proceed w/ colonoscopy & bx;  2) proceed w/ definitive surg approach;  3) continue watchful waiting w/ f/u CEA and CT Abd in ?668mohe will need to be diligent about metamucil, miralax, etc to keep stools soft & moving so as to avoid constip/ obstruction thru the narrowed sigmoid region...   ~  August 31, 2013:  80m81moV & [name] here for follow up- he has done well from the GI standpoint, denies abd pain, n/v, c/d, blood seen; he uses metamucil & states his BMs are normal, no issues; we reviewed his prev work up & we will recheck CBC (wnl) & CEA level (up to 8.0) today... His CC today is no energy, decr stamina, and DOE that seems worse than before- eg taking out the garbage etc but he readily admits NOT exercising; also notes no CP, palpit, edema; he saw DrTaylor for Cards/EP 1/15> ischemic CM, hx VT, chr sysCHF w/ EF in the 35% range, s/pICD- everything stable, no shocks, told to avoid salt, same meds, encouraged exercise...  He does note some cough, sm amt of brown sput, denies f/c/s, no CP, etc... He is taking his Advair500Bid & Mucinex Bid; exam w/ decr BS but clear; see labs & studies below; we decided to treat by adding Spiriva, and a course of Augmentin & Pred...     We reviewed prob list, meds, xrays and labs> see below for updates >>    CXR 4/15 showed stable heart size, pacer, s/p CABG, COPD changes- NAD, calcif pleural plaques stable...  PFT 4/15 showed FVC=2.27 (59%), FEV1=0.69 (24%), %1sec=31, mid-flows=7% predicted... This is c/w GOLD Stage4 COPD...   LABS 4/15:  Chems- ok w/ BS=71 A1c=7.1 BUN=27 Cr=1.6 (stable);  CBC- wnl;  BNP=230;  CEA=8.0 (up from 5.3)       ADDENDUM>> I had a long discussion w/ pt; again reviewed his situation (I told him that I was very concerned that this represented colon cancer) and proposed the same 3 options (aggressive, middle-of-the-road, conservative)  w/ surg f/u DrNewman, colonoscopy w/ bx, continued watchful waiting w/ another CEA + f/u CT Abd in 3-52mo he didn't hesitate in his desire for conserv approach & will f/u in 3-420mos discussed; he will call for any questions in the interim; he does not want another opinion or f/u w/ his specialists at this time...  ~  January 02, 2014:  2m58moV & RobUndreas established w/ [facility]- seen recently after a fall at home w/ skin laceration w/ bleeding & Rx for skin tear... We reviewed the following medical problems during today's office visit >>     GOLD Stage4 COPD> on Advair500Bid, Spiriva, Mucinex; he states breathing at baseline w/ min cough, sput, no hemoptysis, stable DOE which he blames on his CHF; exam is clear w/ decr BS, no wheezing or congestion...     HBP> on Coreg6.25Bid, Vasotec5Bid, Lasix40Bid; BP= 96/60 & he feels weak, tired, fatigued; followed closely by DrTaylor, [facility]...    CAD> offASA now; Hx Lmain + 3vessel dis & CABGx4 in 2003; f/u cath 2008 w/ atretic LIMA to LAD; he denies CP, palpit, dizzy, ch in SOB, or ch in edema...    CHF, Ischemic Cardiomyop, ?clot on ICD lead in right atrium> Recent TEE (7/15) showed severe LVD w/ EF=20-25% & mobile mass on the ICD lead as it passes thru the right atrium- likely clot (on Eliquis now) & not vegetation (cultures neg)    Hx VTach, AICD, now AFlutter> on  Mexiletine150Bid, Amio200Bid, Eliquis5-1/2Bid; he is followed by DrTaylor- generator changed 12/13, seen 8/15 w/ plan for Eliquis x1mo37mon proceed w/ TEE/DCCV...    ASPVDz> off ASA on Eliquis now; Hx PTA to right SFA for ABI=0.36 in past; not active enough for claud; known plaque in Carotids w/o signif flow reduction...    CHOL> on Prav80, Feno160; FLP 6/14 showed TChol 69, TG 169, HDL 15, LDL 21; rec to continue meds better low fat diet, incr exerc...    DM> on Glucovance2.5-500 (oneQam & 1/2Qpm); labs 4/15 showed BS=71, A1c=7.1 and reminded to avoid sweets...    GI- Divertics, Polyps, narrowing in sigmoid w/ elev CEA & abn PET> hx adenomatous polyp in 1997, [facility]; [facility], did virtual CT colon showing stricture of the rectosigmoid w/ asymmetrical thickening worrisome for sigmoid colon carcinoma, 5 mm polyp at 8-10 cm from the anal verge, diverticulosis as well with multiple diverticula within the rectosigmoid colon; DrNewman rec surg but pre-op evals showed high risk & pt opted for watchful waiting;  He has regular soft BMs w/ metamucil rx, no blood, not anemic, denies abn pain, but CEA is rising 5.3=>8.0 (4/15)...    Renal Insuffic> baseline Creat= 1.6-1.7; labs 7/15 on Lasix40Bid showed Cr=2.1 & Cards aware...    DJD, LBP> followed by DrWainer w/ shots in shoulders in the past; he uses rest, heat, OTC Tylenol as needed... He knows to avoid NSAIDs...    Anxiety> aware- he is not on an anxiolytic med by his pref... We reviewed prob list, meds, xrays and labs> see below for updates >>   LABS 7/15:  Chems- ok x BS=148, Cr=2.1;  CBC- ok w/ Hg=13.4...  Marland Kitchen  July 03, 2014:  52mo 352mo& [name] reports that his breathing is fine, great in fact 7 the best it's been in years; he has GOLD Stage4 COPD w/ last Spirometry 4/15 showing FEV1=0.69 (24%); he is on Advair500Bid &  Spiriva daily, plus Mucinex, fluids etc...  He has  lots of Cardiac problems w/ HBP, CAD including Lmain dis w/ CABGx4 in 2003, ischemic cardiomyopathy w/ chr sys CHF & EF~40%, hx AFlutter & VTach w/ AICD placed> followed by DrHochrein, DrBensimhon, & DrKlein;  He was hosp 9/1 - 02/03/14 by Cards w/ acute on chr systolic heart failure & TT=01-77% w/ cardiorenal syndrome, AFlutter, etc> vol overload & low output- he diuresed significantly (>20 lbs) & renal function improved from Cr=2.8 on adm to 1.42 at disch;  He last saw DrBensimhon 12/15 (note reviewed)> current meds include: Eliquis5Bid, Imdur30, Coreg3.125Bid, Apres50Q8h, Amio200, Lasix40, K10Bid; he takes extra20 Lasix prn but states he hasn't needed it lately; he is also on Prav80/d and Synthroid12mg/d...     We reviewed prob list, meds, xrays and labs> see below for updates >> He is followed for Primary care by DrMcGowen- DM regimen changed to Lantus 10u/d + Novolog Tid (taking 7-8-9) and last labs showed BS= 103-157 w/ A1c=5.9..Marland KitchenMarland Kitchen  LABS 1/16:  Ok w/ K=4.2, Cr=1.35, BS=103, BNP=528...  LABS 2/16:  BMet w/ K=3.5, BUN=29, Cr=1.49, BS=117...          Problem List:  GOLD Stage4 COPD (ILTJ-030 >> ex-smoker, quit 2002...  Hx ASTHMATIC BRONCHITIS >> occas acute infectious exacerbations... ~  12/10: had bronchitic exac Rx'd w/ Avelox, Pred, Mucinex, etc & improved==> CXR showed COPD/E, incr interstitial markings & scarring, calcif pleural plaques, enlarged heart & pacer, NAD..Marland Kitchen  ~  7/11:  similar episode, responded to similar meds... ~  CXR 6/13 showed stable heart size s/p CABG, pacer, hyperinflated/ clear lungs w/ interstitial prominence, DJD in TSpine... ~  11/13: similar resp exac treated by TP w/ Augmentin & Pred- improved... ~  CXR 11/13 showed cardiomeg & prev CABG, left subclav AICD, atherosclerotic calcif of Ao, calcif pleural plaques, emphysema w/o acute changes... ~  12/14: on Advair500Bid, XopenexHFA prn, GFN1200Bid; breathing is stable w/o recent exac... ~  4/15: on Advair500Bid,  XopenexHFA prn, Mucinex1200Bid; presents w/ incr dyspnea, no energy, no stamina, notes some cough/ brown phlegm, no hemoptysis; we added Spiriva daily + a course of Augmentin & Pred for 10d; really needs rehab/exercise... ~  CXR 4/15 showed stable heart size, pacer, s/p CABG, COPD changes- NAD, calcif pleural plaques stable... ~  PFT 4/15 showed FVC=2.27 (59%), FEV1=0.69 (24%), %1sec=31, mid-flows=7% predicted... This is c/w GOLD Stage4 COPD. ~  8/15:  Breathing is stable on ADVAIR500Bid & SPIRIVA daily + Mucinex; min cough/ sput, no hemoptysis, stable DOE; no recent resp exac... ~  2/16:  He remains stable on Advair500Bid, Spiriva daily, and Mucinex; states breathing is the best it's been in yrs!  HYPERTENSION (ICD-401.9) >>  ~  meds for BP & his Chr CHF= COREG 12.536mid,  ENALAPRIL 70m18md, & LASIX 71m71m..   ~  labs 1/10 showed K= 5.6, BUN= 31, Creat= 1.9... ACMarland Kitchen/ ARB stopped... pt didn't follow up. ~  8/10: Coreg + Enalapril restarted by DrHochrein... ~  labs 9/10 showed K=5.6, BUN=19, Creat=1.5 ~  labs 12/10 showed K=4.4, BUN= 32, Creat= 1.7, BNP= 102 ~  labs 4/11 showed K=5.1, BUN=16, Creat=1.4 ~  10/11: BP=122/68 today, and feeling well- he denies HA, fatigue, visual changes, CP, palipit, dizziness, syncope, edema, etc. ~  labs 2/13 showed K=5.7, BUN=27, Creat=1.5 ~  6/13: BP= 112/62 and he denies CP, palpit, dizzy, syncope, ch in SOB, edema... ~  Labs 6/13 showed K=5.5, BUN=21, Creat=1.4 ~  12/13: on Coreg12.5Bid, Vasotec5Bid, Lasix20; BP= 100/58 & he denies CP,  palpit, ch in SOB/DOE, edema, etc; we cut the LASIX to 22m prn swelling due to Creat=1.8 ~  6/14: on Coreg12.5Bid, Vasotec5Bid, Lasix40; BP= 120/70 & he feels the Lasix is too strong- labs show BUN=35 Cr=1.6 therefore rec decr to 1/2 tab daily (273m.  ~  12/14: on Coreg6.25Bid, Vasotec2.5Bid, Lasix20; BP= 108/62 & he is feeling well & denies CP, palpit, ch in dyspnea/ edema/ etc... ~  4/15: on Coreg 6.25Bid, Vasotec2.5Bid,  Lasix40;  BP= 110/60 but he is feeling weak, tired, no stamina, no energy w/ DOE sl worse; he has not been exercising & will try to slowly incr his exercise program... ~  8/15: on Coreg6.25Bid, Vasotec5Bid, Lasix40Bid; BP= 96/60 & he feels weak, tired, fatigued; followed closely by DrTaylor, [facility]. ~  2/16: on Coreg3.125Bid, Apres50Tid, Lasix40, K10Bid; BP= 124/62 & he is feeling better, denies CP/ palpit/ ch in SOB/ edema...  CORONARY ARTERY DISEASE (ICD-414.00) - no chest pains, no palpit, no defib discharges... ~ Hx 3 vessel CAD w/ CABG x4 in 2/03 for LMain dis & 3vessel dis + mod LVD EF=35% at that time... ~ Cath 7/06 w/ non-filling of SVG to PDA, & LIMA to LAD was atretic, w/ EF=45%... ~ Cath 10/08 w/ norm SVG to PDA, & atretic LIMA to LAD (due to good native vessel flow), EF=40%... note is made of a 40-50% LMain lesion... ~  EKG 1/13 showed SBrady, rightward axis, IVCD, NSSTTWA... ~  EKG 7/14 showed NSR, rate65, LBBB, T-wave abn... ~  Myoview 7/14> LBBB on EKG, medium sized fixed infer wall defect c/w old IWMI, no reversible ischemia noted, EF=34% w/ infer wall HK...  ~  On Imdur30 + BP meds (Coreg, Apres, Lasix, KCl) and stable but he declined DrBensimhon's referral to Cardiac Rehab & says he'll do it on his own (but hasn't)!  CONGESTIVE HEART FAILURE, SYSTOLIC, CHRONIC (ICFUX-323.5- ischemic cardiomyopathy & meds as above... followed by DrHochrein in the CHF clinic...  see labs above>> ~  seen 8/10 in CHF clinic- Coreg + Enalapril restarted, labs reviewed. ~  2DEcho 2/13 showed LV mildly dilated w/ diffuse HK, incr wall thickness c/w mild LVH, EF=35-40%, mild MR, mild LAdil... ~  Labs 6/13 showed Creat=1.4 & BNP=83 ~  Labs 12/13 showed Creat=1.8 and his Lasix20 was cut to just prn for swelling... ~  3/14: DrTaylor increased his Lasix to 4015m; pt feels this is too strong for him... ~  6/14: Labs showed K=5.6 BUN=35 Cr=1.6 and he is asked to decr the Lasix back to 1/2 tab daily  (46m41m. ~  7/14: He saw DrHochrein> CAD (on ASA325), ischemic cardiomyop, denies CP/ palpit, but weak & SOB, BP was low after virtual colon w/ diarrhea; he stopped his Lasix & decr Coreg to 6.25Bid and Lisin to 2.5Bid. ~  4/15:  He remains on ASA325, & Mexiletine150Bid per DrTaylor & Hochrein> denies CP, palpit, edema- just c/o incr DOE, BNP=230 on the Lasix40 & reminded to elim sodium... ~  7/15: TEE showed severe LVD w/ EF=20-25% & mobile mass on the ICD lead as it passes thru the right atrium- likely clot (on Eliquis now) & not vegetation (cultures neg) ~  Hosp 9/15 by Cards w/ Ac on chr sys HF> diuresed 20+lbs, 2DEcho w/ EF~40% now, improved overall & meds adjusted- now on Eliquis5Bid, Imdur30, Coreg3.125Bid, Apres50Q8h, Amio200, Lasix40, K10Bid; he takes extra20 Lasix prn.  Hx of VENTRICULAR TACHYCARDIA (ICD-427.1) - prev documented medication non-compliance w/ Amio & Mexilitene... DrHochrein has adjusted his meds... ATRIAL FLUTTER >> found 7/15  by [employer]... ~ hx VTach storm w/ AICD placed, followed by DrTaylor/Klein on meds... ~ [place] 10/08 on Amiodarone 259m- 2 tabs twice daily, and MEXILETINE 1525mid... subseq decr Amio 2008md... ~ f/u w/ DrKlein 9/09 w/ defib check- doing well... ~  f/u 9/10 w/ DrKlein on Mexilitene alone, Amio stopped for intolerance... doing well- no changes made. ~  f/u 1/13 w/ DrAllred> stable on Mexilitene, & no changes made... ~  12/13: DrTaylor fixed one lead & changed the AICD generator, he remains stable on the Mexilitene... ~  3/14: He saw DrTaylor> ischemic cardiomyop, chr sys CHF, Hx VT on Mexiletine150Bid & s/p ICD placement; norm device function, no shocks, Lasix incr to 66m42m no salt etc. ~  7/15: Found to be in AFlutter- TEE showed severe LVD w/ EF=20-25% & mobile mass on the ICD lead as it passes thru the right atrium- likely clot (on Eliquis now) & not vegetation (cultures neg); on Mexiletine150Bid, Amio200Bid, Eliquis5-1/2Bid; he is followed by  DrTaylor- generator changed 12/13, seen 8/15 w/ plan for Eliquis x1mo 58mo proceed w/ TEE/DCCV... ~  2/16: on Eliquis5Bid & Amio200- followed by DrKlein...  PERIPHERAL VASCULAR DISEASE (ICD-443.9) >> on ASA 325mg/70m ~ s/p PTA to right SFA for an ABI of 0.36...  ~  CDopplers 10/09 showed bilat carotid disease w/ bruits and mod plaque in bulbs, 0-39% bilat... no change. ~  CDopplers 11/11 showed mod heterogeneous plaque in bulbs, 0-39% bilat ICA stenoses, f/u 53yrs..40yrYPERCHOLESTEROLEMIA (ICD-272.0) - prev on Simvastatin 66mg/d,31m changed to PRAVASTATIN 80mg/d p46mrHochrein (due to interaction of Simva & AOak RidgeFIBRATE 160mg/d ad11m8/09... ~  FLP 8/08 oGoodrichimva40 showed TChol 136, TG 297, HDL 22, LDL 69- but he was ?not taking meds regularly? he freq forgets. ~  FLP 8/09 oMadrasrav80 showed TChol 124, TG 257, HDL 24, LDL 65... rec to add Fenofibrate 160/d... ~  FLP 9/10 on Prav80+Feno160 showed TChol 137, TG 142, HDL 26, LDL 83... continue both meds. ~  FLP 4/11 on Prav80+Feno160 showed TChol 131, TG 139, HDL 33, LDL 70 ~  FLP 10/11 = pt didn't go to the lab for blood work... ~  FLP 2/13 oExeterrav80+Feno160 showed TChol 136, TG 105, HDL 31, LDL 84 ~  FLP 6/14 on Prav80+Feno160 showed TChol 69, TG 169, HDL 15, LDL 21 ~  Labs followed by Cards and DrMcGowen...  DIABETES MELLITUS (ICD-250.00) - on GLUCOVANCE 2.5/500- 1/2 Bid... he states his home BS checks have all been around 100 w/ occas nocturnal hypoglycemia> advised to take PM dose at dinner, not bedtime. ~   prev BS=177, HgA1c=7.0 in Oct08... ~  labs 2/09 showed BS=83, HgA1c=6.8 ~  labs 8/09 showed BS= 118, HgA1c= 7.3 ~  labs 9/10 showed BS= 90, A1c= 7.2 ~  labs 4/11 showed BS= 65, A1c= 6.9 ~  labs 2/13 showed BS= 76-171 ~  Labs 6/13 showed BS= 67, A1c=7.0 ~  12/13: on Glucovance2.5-500; labs today showed BS=134, A1c=7.5 and asked to incr Glucov to an extra 1/2 tab in PM... ~  6/14: on Glucovance2.5-500 (oneQam & 1/2Qpm); labs  today showed BS=111, A1c=6.9 and asked to get on better low carb diet! ~  12/14: on Glucovance2.5-500 (oneQam & 1/2Qpm); labs today showed BS=126, A1c=6.9... ContinuMarland KitchenMarland Kitchensame. ~  4/15: on Glucovance2.5-500 (oneQAM & 1/2QPM)l labs today showed BS=71, A1c=7.1 ~  Pt followed by DrMcGowen for primary care & meds changed based on his CHF, cardiorenal syndrome, Cr up to 2.5 range (subseq improved back to 1.4 range); on Lantus10 &  Novolog 7-8-9 Tid w/ BS= 103-157 & A1c=5.9.Marland KitchenMarland Kitchen  DIVERTICULOSIS OF COLON (ICD-562.10) - last colonoscopy was 10/02 by [facility] showing divertics only... prev polypectomy 1997 for tubular adenoma... overdue for f/u exam... COLONIC POLYPS (ICD-211.3) >>  NARROWING IN SIGMOID COLON w/ asymmetric thickening and elev CEA & abn hypermetabolism on PET scan >> ~  He saw [facility] 7/14 for GI f/u & felt to be hi risk for colonoscopy... ~  Virtual CT Colon was done 7/14 finding> stricture of the rectosigmoid w/ asymmetrical thickening of the rectosigmoid colon at 30 cm from the anal verge, worrisome for sigmoid colon carcinoma, 5 mm polyp at 8-10 cm from the anal verge, diverticulosis as well with multiple diverticula within the rectosigmoid colon; Probable noncalcified pleural plaques at both lung bases; Degenerative disc disease diffusely throughout the lumbar spine with anterolisthesis of L3 on L4 by 5 mm most likely degenerative...  ~  He was sent to [facility] 7/14> he rec surgery but he is obviously high risk=> he sent him to [facility] for clearance=> pt states that he made p his own mind against surg, BMs are better on metamucil daily. ~  12/14: pt ret to see me for routine f/u visit & we reviewed his data from 7/14> we decided to check CEA=5.3 and PET Scan= focal thickening & assoc narrowing of sigmoid colon w/ abnormal hypermetabolism (colon ca vs inflamm), stool in colon, atherosclerotic vasc dis, asbestos related pleural dis bilat...  pt understands options for 1) proceed w/ colonoscopy & bx;   2) proceed w/ definitive surg approach;  3) continue watchful waiting w/ f/u CEA and CT Abd in ?88mo he will need to be diligent about metamucil, miralax, etc top keep stools soft & moving so as to avoid constip/ obstruction thru the narrowed sigmoid region...  ~  4/15: he has done well from the GI standpoint, denies abd pain, n/v, c/d, blood seen; he uses metamucil & states his BMs are normal, no issues; we reviewed his prev work up & rechecked CBC (wnl) & CEA level (up to 8.0) today...  I had a long discussion w/ pt & told him that I was very concerned that this represented colon cancer and proposed the same 3 options (aggressive, middle-of-the-road, conservative) w/ surg f/u DrNewman, colonoscopy w/ bx, continued watchful waiting w/ another CEA + f/u CT Abd in 3-428mohe didn't hesitate in his desire for conserv approach & will f/u in 3-60m360mo discussed; he will call for any questions in the interim; he does not want another opinion or f/u w/ his specialists at this time...  RENAL INSUFFICIENCY (ICD-588.9) - Creat in the 1.6-1.7 range... ~  labs 8/09 showed BUN= 14, Creat= 1.2 ~  labs 1/10 showed BUN= 31, Creat= 1.9 & ACE was held... f/u improved. ~  labs 9/10 showed BUN= 19, Creat= 1.5 ~  labs 4/11 showed BUN= 16, Creat= 1.4 ~  Labs 2/13 showed BUN= 27, Creat= 1.5 ~  Labs 6/13 showed BUN= 21, Creat= 1.4 ~  baseline Creat= 1.4-1.5; labs today reveals BUN=27 Creat=1,8 K=5.2; he is rec to decr the Lasix20 to just PRN for edema... ~  3/14:  DrTaylor increased the Lasix to 44m41m.. ~  6/14:  Labs showed K=5.6 BUN=35 Cr=1.6 and he is asked to decr the Lasix back to 1/2 tab daily (20mg21m ~  12/14:  Labs showed K=5.1, BUN=28, Cr=1.7 ~  4/15::  Labs  Showed BUN=27, Cr=1.6 ~  7/15:  Labs showed BUN=35, Cr=2.1; Cards was reluctant to decr the Lasix  due to edema in legs... ~  Creat rose to 2.8 & Hosp 9/15> subseq improved w/ diuresis etc & was ~1.4 at discharge; followed by Cards and  DrMcGowen...  DEGENERATIVE JOINT DISEASE (ICD-715.90) ~  2/12:  Seen by DrWainer w/ bilat shoulder pain; XRays showed glenohumeral degen changes; given bilat injections w/ relief... ~  12/13:  He is c/o LBP> we rec refewrral to Ortho for further eval; Rx w/ rest, heat, TRAMADOL, ROBAXIN...  ANXIETY (ICD-300.00)  SEBORRHEIC DERMATITIS (ICD-690.10) - he has mod seb derm- discussed Rx w/ Selsun/ T-Gel/ Lotrisone Cream Prn... THIN SKIN >> he fell w/ bruising, ecchymses, skin avulsion right forearm- dressed 7 followed by [facility]...   Past Surgical History  Procedure Laterality Date  . Coronary artery bypass graft  1990's    CABG X4  . Tonsillectomy  ?1942  . Cardiac defibrillator placement  12/2004    single chamber defibrillator- Medtronic Maxima 8/06 DrKlein [Other][  . Tee without cardioversion N/A 12/22/2013    Procedure: TRANSESOPHAGEAL ECHOCARDIOGRAM (TEE);  Surgeon: Thayer Headings, MD;  Location: [place];  Service: Cardiovascular;  Laterality: N/A;  . Cardioversion N/A 12/22/2013    Procedure: CARDIOVERSION;  Surgeon: Thayer Headings, MD;  Location: [facility] ENDOSCOPY;  Service: Cardiovascular;  Laterality: N/A;  . Cardiovascular stress test  01/12/13    myocard perf imaging: previous infarct with a moderately reduced EF as was known previously.  No new findings.   . Gastric emptying scan  02/02/14    Normal  . Transthoracic echocardiogram  01/25/14; 04/2014    EF 40-45%, diffuse hypokinesis, infero-basilar myocardial akinesis, PA pressure slightly high, valves fine.04/2014-Echo today EF ~40% inferior AK moderate to severe MR  . Abdominal ultrasound  01/2014    GB sludge, o/w normal  . Ct virtual colonoscopy diagnostic  11/2012    Asymmetric thickening of the rectosigmoid colon worrisome for  . Cardiovascular stress test      Lexiscan: There is a medium sized fixed inferior wall defect of moderate severity involving the apical inferior and mid inferior and basal inferoseptal segments,  consistent with old inferior MI. No significant reversible ischemia.  EF 34%, inferior wall hypokinesis--intermediate risk scan (ok to do planned procedure)  . Implantable cardioverter defibrillator revision N/A 05/25/2012    Procedure: IMPLANTABLE CARDIOVERTER DEFIBRILLATOR REVISION;  Surgeon: Evans Lance, MD;  Location: [facility] CATH LAB;  Service: Cardiovascular;  Laterality: N/A;    Outpatient Encounter Prescriptions as of 07/03/2014  Medication Sig  . amiodarone (PACERONE) 200 MG tablet Take 1 tablet (200 mg total) by mouth daily.  Marland Kitchen apixaban (ELIQUIS) 5 MG TABS tablet Take 1 tablet (5 mg total) by mouth 2 (two) times daily.  . carvedilol (COREG) 6.25 MG tablet Take 1 tablet (6.25 mg total) by mouth 2 (two) times daily with a meal.  . Fluticasone-Salmeterol (ADVAIR) 500-50 MCG/DOSE AEPB Inhale 1 puff into the lungs 2 (two) times daily.  . furosemide (LASIX) 20 MG tablet Take 2 tablets (40 mg total) by mouth daily. May take additional 20 mg if weight is greater than 154lbs  . Guaifenesin (MUCINEX MAXIMUM STRENGTH) 1200 MG TB12 Take 1,200 mg by mouth 2 (two) times daily.   . hydrALAZINE (APRESOLINE) 50 MG tablet Take 1 tablet (50 mg total) by mouth every 8 (eight) hours.  . insulin aspart (NOVOLOG FLEXPEN) 100 UNIT/ML FlexPen Inject as directed by MD SQ with each meal  . Insulin Glargine (LANTUS SOLOSTAR) 100 UNIT/ML Solostar Pen 10 units SQ qhs  . isosorbide mononitrate (  IMDUR) 30 MG 24 hr tablet Take 1 tablet (30 mg total) by mouth daily.  Marland Kitchen levothyroxine (SYNTHROID, LEVOTHROID) 75 MCG tablet Take 1 tablet (75 mcg total) by mouth daily.  . metolazone (ZAROXOLYN) 2.5 MG tablet Take 1 tablet (2.5 mg total) by mouth once a week. Every Monday  . potassium chloride (K-DUR) 10 MEQ tablet Take 2 tablets (20 mEq total) by mouth daily.  . pravastatin (PRAVACHOL) 80 MG tablet Take 1 tablet (80 mg total) by mouth daily.  Marland Kitchen tiotropium (SPIRIVA) 18 MCG inhalation capsule Place 18 mcg into inhaler and inhale  every evening. Between 4 & 6 pm    Allergies  Allergen Reactions  . Niacin Itching    Niaspan     Current Medications, Allergies, Past Medical History, Past Surgical History, Family History, and Social History were reviewed in [employer] record.   Review of Systems        See HPI - all other systems neg except as noted...       The patient complains of dyspnea on exertion.  The patient denies anorexia, fever, weight loss, weight gain, vision loss, decreased hearing, hoarseness, chest pain, syncope, peripheral edema, prolonged cough, headaches, hemoptysis, abdominal pain, melena, hematochezia, severe indigestion/heartburn, hematuria, incontinence, muscle weakness, suspicious skin lesions, transient blindness, difficulty walking, depression, unusual weight change, abnormal bleeding, enlarged lymph nodes, and angioedema.     Objective:   Physical Exam    WD, WN, 79 y/o WM in NAD... GENERAL:  Alert & oriented; pleasant & cooperative... HEENT:  [place]/AT, EOM-wnl, PERRLA, EACs-clear, TMs-wnl, NOSE-clear, THROAT-clear & wnl. NECK:  Supple w/ fairROM; no JVD; normal carotid impulses w/o bruits; no thyromegaly or nodules palpated; no lymphadenopathy. CHEST:  Few scat rhonchi without wheezing, rales, or signs of consolid... HEART:  Regular Rhythm;  gr1/6 SEM, without rubs or gallops heard... ABDOMEN:  Soft & nontender; normal bowel sounds; no organomegaly or masses detected. EXT: without deformities, mild arthritic changes; no varicose veins/ venous insuffic/ or edema. NEURO:  CN's intact; motor testing normal; sensory testing normal; gait normal & balance OK. DERM:  No lesions noted; no rash etc...  RADIOLOGY DATA:  Reviewed in the EPIC EMR & discussed w/ the patient...  LABORATORY DATA:  Reviewed in the EPIC EMR & discussed w/ the patient...    Assessment:      COPD/ AB>  PFTs show GOLD Stage4 COPD; we are treating w/ Advair, Spiriva, Xopenex prn, Mucinex; asked  to take meds regularly & incr exercise... 4/15> we will treat w/ Augmentin & Pred for his resp symptoms, incr SOB, etc... 8/15> back to baseline breathing, no exac; currently on Eliquis awaiting TEE/ DCCV by Cards... 2/16> he reports breathing is the best it's been in yrs- continue same rx.  HBP>  Managed by Cards & much improved...  CAD>  S/p CABG, he is too sedentary & asked to incr exercise; no ischemia on Myoview 7/14...  CHF, Cardiomyopathy>  Followed by DrHochrein, Bensimhon, & Lovena Le; continue meds...  Hx VTach, now AFlutter>  off Mexilitene & on Amio + Eliquis; has AICD w/ ? clot on lead in right atrium=> followed by Cards...  ASPVD>  He is s/p right SFA PTA & doing satis, but needs to incr exercise & continue ASA...  CHOL>  On Prav80 + FLP looks good...  DM>  On diet + Lantus/ Novolog per DrMcGowen; control looks a little tight w/ A1c=5.9, watch BSs...  RECTOSIGMOID NARROWING >> w/ CEA 5.3=>8.0 and Abn PET scan as above;  I told him I thought this was poss a colon cancer; we outlined his options and he again has chosen watchful waiting & declines colonoscopy or surgery; he had f/u [name] who feels that this area of thickening is benign scar tissue & he will maintain GI & primary care follow up...  Renal Insuffic>  Creat= 1.35 currently, mus=ch improved...  DJD>  Aware, stable on OTC meds...  Anxiety>  Stable & not on anxiolytic Rx...     Plan:     Patient's Medications  New Prescriptions   No medications on file  Previous Medications   AMIODARONE (PACERONE) 200 MG TABLET    Take 1 tablet (200 mg total) by mouth daily.   APIXABAN (ELIQUIS) 5 MG TABS TABLET    Take 1 tablet (5 mg total) by mouth 2 (two) times daily.   CARVEDILOL (COREG) 6.25 MG TABLET    Take 1 tablet (6.25 mg total) by mouth 2 (two) times daily with a meal.   FLUTICASONE-SALMETEROL (ADVAIR) 500-50 MCG/DOSE AEPB    Inhale 1 puff into the lungs 2 (two) times daily.   FUROSEMIDE (LASIX) 20 MG TABLET     Take 2 tablets (40 mg total) by mouth daily. May take additional 20 mg if weight is greater than 154lbs   GUAIFENESIN (MUCINEX MAXIMUM STRENGTH) 1200 MG TB12    Take 1,200 mg by mouth 2 (two) times daily.    HYDRALAZINE (APRESOLINE) 50 MG TABLET    Take 1 tablet (50 mg total) by mouth every 8 (eight) hours.   INSULIN ASPART (NOVOLOG FLEXPEN) 100 UNIT/ML FLEXPEN    Inject as directed by MD SQ with each meal   INSULIN GLARGINE (LANTUS SOLOSTAR) 100 UNIT/ML SOLOSTAR PEN    10 units SQ qhs   ISOSORBIDE MONONITRATE (IMDUR) 30 MG 24 HR TABLET    Take 1 tablet (30 mg total) by mouth daily.   LEVOTHYROXINE (SYNTHROID, LEVOTHROID) 75 MCG TABLET    Take 1 tablet (75 mcg total) by mouth daily.   METOLAZONE (ZAROXOLYN) 2.5 MG TABLET    Take 1 tablet (2.5 mg total) by mouth once a week. Every Monday   POTASSIUM CHLORIDE (K-DUR) 10 MEQ TABLET    Take 2 tablets (20 mEq total) by mouth daily.   PRAVASTATIN (PRAVACHOL) 80 MG TABLET    Take 1 tablet (80 mg total) by mouth daily.   TIOTROPIUM (SPIRIVA) 18 MCG INHALATION CAPSULE    Place 18 mcg into inhaler and inhale every evening. Between 4 & 6 pm  Modified Medications   No medications on file  Discontinued Medications   No medications on file

## 2014-07-05 ENCOUNTER — Other Ambulatory Visit (HOSPITAL_COMMUNITY): Payer: Medicare Other

## 2014-07-11 ENCOUNTER — Encounter: Payer: Self-pay | Admitting: Cardiology

## 2014-07-20 ENCOUNTER — Ambulatory Visit (HOSPITAL_COMMUNITY)
Admission: RE | Admit: 2014-07-20 | Discharge: 2014-07-20 | Disposition: A | Discharge status: Home / Self Care | Payer: Medicare Other | Source: Ambulatory Visit | Attending: Cardiology | Admitting: Cardiology

## 2014-07-20 VITALS — BP 96/50 | HR 50 | Wt 151.5 lb

## 2014-07-20 DIAGNOSIS — Z951 Presence of aortocoronary bypass graft: Secondary | ICD-10-CM | POA: Diagnosis not present

## 2014-07-20 DIAGNOSIS — I5022 Chronic systolic (congestive) heart failure: Secondary | ICD-10-CM | POA: Diagnosis present

## 2014-07-20 DIAGNOSIS — J449 Chronic obstructive pulmonary disease, unspecified: Secondary | ICD-10-CM | POA: Insufficient documentation

## 2014-07-20 DIAGNOSIS — I4891 Unspecified atrial fibrillation: Secondary | ICD-10-CM | POA: Insufficient documentation

## 2014-07-20 DIAGNOSIS — E119 Type 2 diabetes mellitus without complications: Secondary | ICD-10-CM | POA: Diagnosis not present

## 2014-07-20 DIAGNOSIS — N183 Chronic kidney disease, stage 3 (moderate): Secondary | ICD-10-CM | POA: Insufficient documentation

## 2014-07-20 DIAGNOSIS — I2510 Atherosclerotic heart disease of native coronary artery without angina pectoris: Secondary | ICD-10-CM | POA: Insufficient documentation

## 2014-07-20 DIAGNOSIS — I340 Nonrheumatic mitral (valve) insufficiency: Secondary | ICD-10-CM | POA: Insufficient documentation

## 2014-07-20 LAB — CBC
HCT: 32.1 % — ABNORMAL LOW (ref 39.0–52.0)
HEMOGLOBIN: 10.3 g/dL — AB (ref 13.0–17.0)
MCH: 27.2 pg (ref 26.0–34.0)
MCHC: 32.1 g/dL (ref 30.0–36.0)
MCV: 84.7 fL (ref 78.0–100.0)
PLATELETS: 166 10*3/uL (ref 150–400)
RBC: 3.79 MIL/uL — ABNORMAL LOW (ref 4.22–5.81)
RDW: 16.7 % — ABNORMAL HIGH (ref 11.5–15.5)
WBC: 8.3 10*3/uL (ref 4.0–10.5)

## 2014-07-20 LAB — COMPREHENSIVE METABOLIC PANEL
ALBUMIN: 3 g/dL — AB (ref 3.5–5.2)
ALT: 21 U/L (ref 0–53)
AST: 65 U/L — AB (ref 0–37)
Alkaline Phosphatase: 109 U/L (ref 39–117)
Anion gap: 11 (ref 5–15)
BUN: 41 mg/dL — ABNORMAL HIGH (ref 6–23)
CO2: 30 mmol/L (ref 19–32)
Calcium: 9.2 mg/dL (ref 8.4–10.5)
Chloride: 96 mmol/L (ref 96–112)
Creatinine, Ser: 2.03 mg/dL — ABNORMAL HIGH (ref 0.50–1.35)
GFR calc Af Amer: 35 mL/min — ABNORMAL LOW (ref >90)
GFR calc non Af Amer: 30 mL/min — ABNORMAL LOW (ref >90)
GLUCOSE: 180 mg/dL — AB (ref 70–99)
POTASSIUM: 4.1 mmol/L (ref 3.5–5.1)
Sodium: 137 mmol/L (ref 135–145)
Total Bilirubin: 1.2 mg/dL (ref 0.3–1.2)
Total Protein: 7.1 g/dL (ref 6.0–8.3)

## 2014-07-20 NOTE — Progress Notes (Signed)
Patient ID: [name], male   DOB: [birth year], 79 y.o.   MRN: [id number]  PCP: [name] GI: [name].    HPI: [name] is a 79 yo male with a history of HTN, CAD s/p CABG, ICM s/p Medtronic ICD, chronic systolic heart failure, DM2, Vtach, atrial flutter, PVD and GI mass. ECHO (12/22/13) EF 20-25%, Echo  EF 40% (12/15)  Mod-severe [name].   On September 1st he was admitted from [name] office with volume overload and low output heart failure. Creatinine was 2.8 on admit (baseline 1.6). Central line was placed to monitor CVP and CO-OX. Initial CO-OX was 49% and he was started on Milrinone and IV lasix. Discharge weight was 151 pounds. Creatinine was 1.42.   Saw [name]  for possible colon mass/thickening and felt to be result of previous diverticulitis.   Follow-up: Returns with his wife for f/u. At last visit volume status was a bit up. Added metolazone every Monday. Feels good. Weight at home has been stable around 149 (which is down a pound or two). Breathing ok. BP remains low. Denies dizziness.  No edema/orthopnea/PND. Watching salt more closely. No bleeding with apixaban,   Echo 12/15 EF  ~40% inferior AK moderate to severe [name]. RV mildly HK   ICD interrogation: Optivol Fluid index now down below threshold No AF/VT. Activity 3h/day. HR variability low  Labs 02/09/14 K 3.9 Creatinine 1.38 Pro BNP 2555  Labs 02/20/14 K 3.5 NA 127 Creatinine 1.6  03/01/14 TSH 19.8  T4  1.03 T3 84  Labs 03/03/14 CEA 9.0  K 4.3 creatinine 1.2 NA 134 Hgb 12.7 TSH 19.8 T3 84.9 T4 1.03   CT Virtual Colonoscopy- Diverticulosis ? Rectosigmoid Colon Ca  ROS: All systems negative except as listed in HPI, PMH and Problem List.  Past Medical History  Diagnosis Date  . VENTRICULAR TACHYCARDIA   . PERIPHERAL VASCULAR DISEASE   . HYPERTENSION     Hypertensive retinopathy-grade II OU  . HYPERCHOLESTEROLEMIA   . CORONARY ARTERY DISEASE     Hx of CABG  . Chronic systolic heart failure    Ischemic cardiomyopathy (01/2014 EF 40-45% with hypokinesis + small area of akinesis  . CAROTID ARTERY DISEASE   . SEBORRHEIC DERMATITIS   . Chronic renal insufficiency, stage III (moderate)     CrCl 40s-50s  . DIVERTICULOSIS OF COLON   . COPD, severe   . COLONIC POLYPS   . ANXIETY   . ICD (implantable cardiac defibrillator) in place   . Type II diabetes mellitus     +background diabetic retinopathy OU  . Chronic lower back pain   . DJD (degenerative joint disease)     "hands" (05/24/2012)  . Osteoarthritis of finger   . History of atrial flutter   . Thrombus 12/2013    on ICD lead--started anticoag and his cardioversion for atrial flutter was postponed.  . Colon stricture 2015    with features worrisome for mass, also with recent rising CEA level (possible colon cancer)-GI MD= [name] recent eval/follow up with [name]. [name] was summer 2014, at which time he recommended colonoscopy but pt declined.  . Macular degeneration, age related, nonexudative     OU    Current Outpatient Prescriptions  Medication Sig Dispense Refill  . amiodarone (PACERONE) 200 MG tablet Take 1 tablet (200 mg total) by mouth daily. 30 tablet 6  . apixaban (ELIQUIS) 5 MG TABS tablet Take 1 tablet (5 mg total) by mouth 2 (two) times daily. [place]  tablet 6  . carvedilol (COREG) 6.25 MG tablet Take 1 tablet (6.25 mg total) by mouth 2 (two) times daily with a meal. 60 tablet 3  . Fluticasone-Salmeterol (ADVAIR) 500-50 MCG/DOSE AEPB Inhale 1 puff into the lungs 2 (two) times daily. 60 each 11  . furosemide (LASIX) 20 MG tablet Take 2 tablets (40 mg total) by mouth daily. May take additional 20 mg if weight is greater than 154lbs 60 tablet 6  . Guaifenesin (MUCINEX MAXIMUM STRENGTH) 1200 MG TB12 Take 1,200 mg by mouth 2 (two) times daily.     . hydrALAZINE (APRESOLINE) 50 MG tablet Take 1 tablet (50 mg total) by mouth every 8 (eight) hours. 90 tablet 6  . insulin aspart (NOVOLOG FLEXPEN) 100 UNIT/ML FlexPen Inject  as directed by MD SQ with each meal 15 mL 11  . Insulin Glargine (LANTUS SOLOSTAR) 100 UNIT/ML Solostar Pen 10 units SQ qhs 15 mL 11  . isosorbide mononitrate (IMDUR) 30 MG 24 hr tablet Take 1 tablet (30 mg total) by mouth daily. 30 tablet 6  . levothyroxine (SYNTHROID, LEVOTHROID) 75 MCG tablet Take 1 tablet (75 mcg total) by mouth daily. 30 tablet 3  . metolazone (ZAROXOLYN) 2.5 MG tablet Take 1 tablet (2.5 mg total) by mouth once a week. Every Monday 10 tablet 3  . potassium chloride (K-DUR) 10 MEQ tablet Take 2 tablets (20 mEq total) by mouth daily. 60 tablet 3  . pravastatin (PRAVACHOL) 80 MG tablet Take 1 tablet (80 mg total) by mouth daily. 30 tablet 10  . tiotropium (SPIRIVA) 18 MCG inhalation capsule Place 18 mcg into inhaler and inhale every evening. Between 4 & 6 pm     No current facility-administered medications for this encounter.     PHYSICAL EXAM: Filed Vitals:   07/20/14 1359  BP: 96/50  Pulse: 50  Weight: 151 lb 8 oz (68.72 kg)  SpO2: 95%    General:  Well appearing. No resp difficulty HEENT: normal Neck: supple. JVP 7. Carotids 2+ bilaterally; no bruits. No lymphadenopathy or thryomegaly appreciated. Cor: PMI normal. Regular rate & rhythm. No rubs, gallops or murmurs. Lungs: clear Abdomen: soft, nontender, nondistended. No hepatosplenomegaly. No bruits or masses. Good bowel sounds. Extremities: no cyanosis, clubbing, rash, R and LLE 1+ edema Neuro: alert & orientedx3, cranial nerves grossly intact. Moves all 4 extremities w/o difficulty. Affect pleasant.  ICD interrogation shows Optivol maxed out (doubt this is accurate). No VT/AF. Activity 3 hours  ASSESSMENT & PLAN: [name] is a 79 year old with ischemic cardiomyopathy and chronic systolic heart failure recently admitted with cardiogenic shock that required short term milrinone. 1. Chronic Systolic Heart Failure: Ischemic cardiomyopathy. Has Medtronic ICD. ECHO 12/15 EF 40%. NYHA II-III.  -Volume status  improved. Goal weight at home probably in 147-149 range -Will continue lasix to 40 daily and metolazone 2.5 qMonday - Continue carvedilol to 6.25 bid - Continue hydralazine 50 mg every 8 hours/Imdur 30 mg daily - BP soft and HR low if symptomatic would decrease carvedilol to 3.125 bid - He is not Ace or spiro in past due to CKD. Can reconsider in future if BP allows.  2. A fib/Aflutter: Maintaining SR.  - Continue amiodarone 200 mg daily. Recent LFTs ok. Will need yearly eye exams.  - Continue Eliquis 5 mg bid.  3. Mitral regurgitation - moderate to severe due to restriction of posterior MV leaflet. We discussed the possibility of TEE and possible repair. However, I think a major surgery like that could potentially  be a major setback for him especially with his COPD. He is currently doing quite well. We will follow closely with surveillance echos and if LV dilating then will plan TEE to further evaluate.  4. CKD: Stable III. Check labs today.  5. DM: Per PCP.  6. CAD: s/p CABG.  Continue pravastatin.  Not on ASA with apixaban use.  7. COPD 8. H/o VT: He has an ICD and is now on amiodarone.  Stable  Glori Bickers MD  2:08 PM

## 2014-07-20 NOTE — Patient Instructions (Signed)
Labs today  We will contact you in 3 months to schedule your next appointment.  

## 2014-07-27 ENCOUNTER — Ambulatory Visit (HOSPITAL_COMMUNITY)
Admission: RE | Admit: 2014-07-27 | Discharge: 2014-07-27 | Disposition: A | Discharge status: Home / Self Care | Payer: Medicare Other | Source: Ambulatory Visit | Attending: Cardiology | Admitting: Cardiology

## 2014-07-27 DIAGNOSIS — I5023 Acute on chronic systolic (congestive) heart failure: Secondary | ICD-10-CM | POA: Insufficient documentation

## 2014-07-27 DIAGNOSIS — I5022 Chronic systolic (congestive) heart failure: Secondary | ICD-10-CM

## 2014-07-27 LAB — BASIC METABOLIC PANEL
ANION GAP: 6 (ref 5–15)
BUN: 41 mg/dL — AB (ref 6–23)
CO2: 34 mmol/L — ABNORMAL HIGH (ref 19–32)
CREATININE: 1.98 mg/dL — AB (ref 0.50–1.35)
Calcium: 9.2 mg/dL (ref 8.4–10.5)
Chloride: 98 mmol/L (ref 96–112)
GFR, EST AFRICAN AMERICAN: 36 mL/min — AB (ref >90)
GFR, EST NON AFRICAN AMERICAN: 31 mL/min — AB (ref >90)
Glucose, Bld: 131 mg/dL — ABNORMAL HIGH (ref 70–99)
Potassium: 3.4 mmol/L — ABNORMAL LOW (ref 3.5–5.1)
Sodium: 138 mmol/L (ref 135–145)

## 2014-08-02 ENCOUNTER — Telehealth: Payer: Self-pay | Admitting: Family Medicine

## 2014-08-02 MED ORDER — INSULIN LISPRO 100 UNIT/ML (KWIKPEN): PEN_INJECTOR | SUBCUTANEOUS | Status: DC

## 2014-08-02 NOTE — Telephone Encounter (Signed)
Patient aware.

## 2014-08-02 NOTE — Telephone Encounter (Signed)
His insurance sent a letter stating they will no longer cover Novolog as his mealtime insulin, so i am going to send in eRx for Humalog, which is the mealtime insulin that they WILL cover. Tell him it is the exact same dosing as the Novolog was b/c these two insulins are essentially the same.  He can finish out all the Novolog he has and then fill the rx for the Humalog.-thx

## 2014-08-10 ENCOUNTER — Encounter: Payer: Self-pay | Admitting: Internal Medicine

## 2014-08-28 ENCOUNTER — Telehealth (HOSPITAL_COMMUNITY): Payer: Self-pay | Admitting: Vascular Surgery

## 2014-08-28 NOTE — Telephone Encounter (Signed)
Left message to call back  

## 2014-08-28 NOTE — Telephone Encounter (Signed)
Pt wife left message pt has gained 4 to 5 bls, he not eating , and he sleeping all the time , wife believes he needs to be seen.. please

## 2014-08-28 NOTE — Telephone Encounter (Signed)
pts wife are and voiced understanding Labs  4/13 F/u 4/22

## 2014-08-28 NOTE — Telephone Encounter (Signed)
Change Lasix to 40 mg bid x 4 days then resume 40 mg daily.  Will need BMET in 1 week and office followup.

## 2014-08-28 NOTE — Telephone Encounter (Signed)
Spoke w/pt's wife, she states pt's abd is swelling and wt is up to 156 lb, home wt should be 147-149 lb.  He usually takes lasix 40 mg daily but has increased to 60 mg every other day, he has taken his metolazone twice in the past week, last dose was yesterday and wt was up 1 lb today.  She states he doesn't seem to be SOB but he is very fatigued and sleeps a lot.  He does abd distention and slight edema in LE.  Will send to MD for review and call her back later today

## 2014-08-30 ENCOUNTER — Telehealth (HOSPITAL_COMMUNITY): Payer: Self-pay

## 2014-08-30 ENCOUNTER — Inpatient Hospital Stay (HOSPITAL_COMMUNITY)
Admission: AD | Admit: 2014-08-30 | Discharge: 2014-08-31 | DRG: 286 | Disposition: A | Discharge status: Home Health | Payer: Medicare Other | Source: Ambulatory Visit | Attending: Cardiology | Admitting: Cardiology

## 2014-08-30 VITALS — BP 109/37 | HR 52 | Temp 98.0°F | Resp 9 | Wt 166.0 lb

## 2014-08-30 DIAGNOSIS — I5023 Acute on chronic systolic (congestive) heart failure: Principal | ICD-10-CM

## 2014-08-30 DIAGNOSIS — E871 Hypo-osmolality and hyponatremia: Secondary | ICD-10-CM | POA: Diagnosis present

## 2014-08-30 DIAGNOSIS — H3531 Nonexudative age-related macular degeneration: Secondary | ICD-10-CM | POA: Diagnosis present

## 2014-08-30 DIAGNOSIS — M545 Low back pain: Secondary | ICD-10-CM | POA: Diagnosis present

## 2014-08-30 DIAGNOSIS — I959 Hypotension, unspecified: Secondary | ICD-10-CM | POA: Diagnosis present

## 2014-08-30 DIAGNOSIS — I130 Hypertensive heart and chronic kidney disease with heart failure and stage 1 through stage 4 chronic kidney disease, or unspecified chronic kidney disease: Secondary | ICD-10-CM | POA: Diagnosis present

## 2014-08-30 DIAGNOSIS — R570 Cardiogenic shock: Secondary | ICD-10-CM | POA: Diagnosis not present

## 2014-08-30 DIAGNOSIS — I5022 Chronic systolic (congestive) heart failure: Secondary | ICD-10-CM | POA: Diagnosis not present

## 2014-08-30 DIAGNOSIS — N183 Chronic kidney disease, stage 3 unspecified: Secondary | ICD-10-CM

## 2014-08-30 DIAGNOSIS — E1165 Type 2 diabetes mellitus with hyperglycemia: Secondary | ICD-10-CM | POA: Diagnosis present

## 2014-08-30 DIAGNOSIS — F419 Anxiety disorder, unspecified: Secondary | ICD-10-CM | POA: Diagnosis present

## 2014-08-30 DIAGNOSIS — Z951 Presence of aortocoronary bypass graft: Secondary | ICD-10-CM

## 2014-08-30 DIAGNOSIS — D500 Iron deficiency anemia secondary to blood loss (chronic): Secondary | ICD-10-CM | POA: Diagnosis not present

## 2014-08-30 DIAGNOSIS — I272 Other secondary pulmonary hypertension: Secondary | ICD-10-CM | POA: Diagnosis present

## 2014-08-30 DIAGNOSIS — Z7901 Long term (current) use of anticoagulants: Secondary | ICD-10-CM

## 2014-08-30 DIAGNOSIS — K5730 Diverticulosis of large intestine without perforation or abscess without bleeding: Secondary | ICD-10-CM | POA: Diagnosis present

## 2014-08-30 DIAGNOSIS — E1122 Type 2 diabetes mellitus with diabetic chronic kidney disease: Secondary | ICD-10-CM | POA: Diagnosis present

## 2014-08-30 DIAGNOSIS — I4891 Unspecified atrial fibrillation: Secondary | ICD-10-CM | POA: Diagnosis present

## 2014-08-30 DIAGNOSIS — B3781 Candidal esophagitis: Secondary | ICD-10-CM | POA: Diagnosis present

## 2014-08-30 DIAGNOSIS — H35039 Hypertensive retinopathy, unspecified eye: Secondary | ICD-10-CM

## 2014-08-30 DIAGNOSIS — Z87891 Personal history of nicotine dependence: Secondary | ICD-10-CM

## 2014-08-30 DIAGNOSIS — I340 Nonrheumatic mitral (valve) insufficiency: Secondary | ICD-10-CM | POA: Diagnosis present

## 2014-08-30 DIAGNOSIS — I480 Paroxysmal atrial fibrillation: Secondary | ICD-10-CM | POA: Diagnosis present

## 2014-08-30 DIAGNOSIS — I5021 Acute systolic (congestive) heart failure: Secondary | ICD-10-CM | POA: Diagnosis not present

## 2014-08-30 DIAGNOSIS — E039 Hypothyroidism, unspecified: Secondary | ICD-10-CM | POA: Diagnosis present

## 2014-08-30 DIAGNOSIS — J9610 Chronic respiratory failure, unspecified whether with hypoxia or hypercapnia: Secondary | ICD-10-CM | POA: Diagnosis present

## 2014-08-30 DIAGNOSIS — I255 Ischemic cardiomyopathy: Secondary | ICD-10-CM | POA: Diagnosis present

## 2014-08-30 DIAGNOSIS — D638 Anemia in other chronic diseases classified elsewhere: Secondary | ICD-10-CM | POA: Diagnosis present

## 2014-08-30 DIAGNOSIS — K921 Melena: Secondary | ICD-10-CM | POA: Diagnosis present

## 2014-08-30 DIAGNOSIS — H35033 Hypertensive retinopathy, bilateral: Secondary | ICD-10-CM | POA: Diagnosis present

## 2014-08-30 DIAGNOSIS — Z95810 Presence of automatic (implantable) cardiac defibrillator: Secondary | ICD-10-CM

## 2014-08-30 DIAGNOSIS — I2510 Atherosclerotic heart disease of native coronary artery without angina pectoris: Secondary | ICD-10-CM | POA: Diagnosis present

## 2014-08-30 DIAGNOSIS — I472 Ventricular tachycardia: Secondary | ICD-10-CM

## 2014-08-30 DIAGNOSIS — Z79899 Other long term (current) drug therapy: Secondary | ICD-10-CM

## 2014-08-30 DIAGNOSIS — R0602 Shortness of breath: Secondary | ICD-10-CM | POA: Diagnosis present

## 2014-08-30 DIAGNOSIS — Z794 Long term (current) use of insulin: Secondary | ICD-10-CM

## 2014-08-30 DIAGNOSIS — E1151 Type 2 diabetes mellitus with diabetic peripheral angiopathy without gangrene: Secondary | ICD-10-CM

## 2014-08-30 DIAGNOSIS — K761 Chronic passive congestion of liver: Secondary | ICD-10-CM | POA: Diagnosis present

## 2014-08-30 DIAGNOSIS — J449 Chronic obstructive pulmonary disease, unspecified: Secondary | ICD-10-CM | POA: Diagnosis not present

## 2014-08-30 DIAGNOSIS — I509 Heart failure, unspecified: Secondary | ICD-10-CM | POA: Diagnosis not present

## 2014-08-30 DIAGNOSIS — Z8249 Family history of ischemic heart disease and other diseases of the circulatory system: Secondary | ICD-10-CM | POA: Diagnosis not present

## 2014-08-30 DIAGNOSIS — E785 Hyperlipidemia, unspecified: Secondary | ICD-10-CM | POA: Diagnosis present

## 2014-08-30 DIAGNOSIS — I739 Peripheral vascular disease, unspecified: Secondary | ICD-10-CM | POA: Diagnosis present

## 2014-08-30 DIAGNOSIS — I4892 Unspecified atrial flutter: Secondary | ICD-10-CM | POA: Diagnosis present

## 2014-08-30 DIAGNOSIS — K922 Gastrointestinal hemorrhage, unspecified: Secondary | ICD-10-CM | POA: Diagnosis not present

## 2014-08-30 DIAGNOSIS — E780 Pure hypercholesterolemia: Secondary | ICD-10-CM | POA: Diagnosis present

## 2014-08-30 DIAGNOSIS — G8929 Other chronic pain: Secondary | ICD-10-CM | POA: Diagnosis present

## 2014-08-30 DIAGNOSIS — I129 Hypertensive chronic kidney disease with stage 1 through stage 4 chronic kidney disease, or unspecified chronic kidney disease: Secondary | ICD-10-CM

## 2014-08-30 DIAGNOSIS — R0600 Dyspnea, unspecified: Secondary | ICD-10-CM

## 2014-08-30 DIAGNOSIS — N179 Acute kidney failure, unspecified: Secondary | ICD-10-CM | POA: Diagnosis not present

## 2014-08-30 LAB — CBC
HCT: 26.1 % — ABNORMAL LOW (ref 39.0–52.0)
HEMOGLOBIN: 8.6 g/dL — AB (ref 13.0–17.0)
MCH: 27.7 pg (ref 26.0–34.0)
MCHC: 33 g/dL (ref 30.0–36.0)
MCV: 84.2 fL (ref 78.0–100.0)
Platelets: 134 10*3/uL — ABNORMAL LOW (ref 150–400)
RBC: 3.1 MIL/uL — AB (ref 4.22–5.81)
RDW: 17.5 % — ABNORMAL HIGH (ref 11.5–15.5)
WBC: 9.8 10*3/uL (ref 4.0–10.5)

## 2014-08-30 LAB — PROTIME-INR
INR: 2.4 — AB (ref 0.00–1.49)
PROTHROMBIN TIME: 26.3 s — AB (ref 11.6–15.2)

## 2014-08-30 LAB — GLUCOSE, CAPILLARY: Glucose-Capillary: 238 mg/dL — ABNORMAL HIGH (ref 70–99)

## 2014-08-30 LAB — BASIC METABOLIC PANEL
Anion gap: 11 (ref 5–15)
BUN: 71 mg/dL — ABNORMAL HIGH (ref 6–23)
CHLORIDE: 89 mmol/L — AB (ref 96–112)
CO2: 28 mmol/L (ref 19–32)
Calcium: 8.4 mg/dL (ref 8.4–10.5)
Creatinine, Ser: 2.88 mg/dL — ABNORMAL HIGH (ref 0.50–1.35)
GFR calc Af Amer: 23 mL/min — ABNORMAL LOW (ref >90)
GFR calc non Af Amer: 20 mL/min — ABNORMAL LOW (ref >90)
Glucose, Bld: 179 mg/dL — ABNORMAL HIGH (ref 70–99)
POTASSIUM: 4.1 mmol/L (ref 3.5–5.1)
SODIUM: 128 mmol/L — AB (ref 135–145)

## 2014-08-30 LAB — MAGNESIUM: MAGNESIUM: 1.9 mg/dL (ref 1.5–2.5)

## 2014-08-30 LAB — BRAIN NATRIURETIC PEPTIDE: B Natriuretic Peptide: 800.7 pg/mL — ABNORMAL HIGH (ref 0.0–100.0)

## 2014-08-30 LAB — MRSA PCR SCREENING: MRSA by PCR: POSITIVE — AB

## 2014-08-30 MED ORDER — ASPIRIN 81 MG PO CHEW: 81.0000 mg | CHEWABLE_TABLET | ORAL | Status: DC

## 2014-08-30 MED ORDER — ONDANSETRON HCL 4 MG/2ML IJ SOLN: 4.0000 mg | Freq: Four times a day (QID) | INTRAMUSCULAR | Status: DC | PRN

## 2014-08-30 MED ORDER — SODIUM CHLORIDE 0.9 % IV SOLN
INTRAVENOUS | Status: DC
Start: 2014-08-31 — End: 2014-08-31

## 2014-08-30 MED ORDER — FUROSEMIDE 10 MG/ML IJ SOLN
80.0000 mg | Freq: Two times a day (BID) | INTRAMUSCULAR | Status: DC
  Administered 2014-08-30: 80 mg via INTRAVENOUS
  Filled 2014-08-30: qty 8

## 2014-08-30 MED ORDER — SODIUM CHLORIDE 0.9 % IJ SOLN: 3.0000 mL | INTRAMUSCULAR | Status: DC | PRN

## 2014-08-30 MED ORDER — ACETAMINOPHEN 325 MG PO TABS: 650.0000 mg | ORAL_TABLET | ORAL | Status: DC | PRN

## 2014-08-30 MED ORDER — CHLORHEXIDINE GLUCONATE CLOTH 2 % EX PADS
6.0000 | MEDICATED_PAD | Freq: Every day | CUTANEOUS | Status: DC
Start: 2014-08-31 — End: 2014-08-30

## 2014-08-30 MED ORDER — MOMETASONE FURO-FORMOTEROL FUM 200-5 MCG/ACT IN AERO
2.0000 | INHALATION_SPRAY | Freq: Two times a day (BID) | RESPIRATORY_TRACT | Status: DC
  Administered 2014-08-30: 2 via RESPIRATORY_TRACT
  Filled 2014-08-30: qty 8.8

## 2014-08-30 MED ORDER — SODIUM CHLORIDE 0.9 % IV SOLN: 250.0000 mL | INTRAVENOUS | Status: DC | PRN

## 2014-08-30 MED ORDER — INSULIN ASPART 100 UNIT/ML ~~LOC~~ SOLN: 0.0000 [IU] | Freq: Three times a day (TID) | SUBCUTANEOUS | Status: DC

## 2014-08-30 MED ORDER — AMIODARONE HCL 200 MG PO TABS
200.0000 mg | ORAL_TABLET | Freq: Every day | ORAL | Status: DC
  Administered 2014-08-30: 200 mg via ORAL
  Filled 2014-08-30: qty 1

## 2014-08-30 MED ORDER — MUPIROCIN 2 % EX OINT
1.0000 "application " | TOPICAL_OINTMENT | Freq: Two times a day (BID) | CUTANEOUS | Status: DC
  Administered 2014-08-30: 1 via NASAL
  Filled 2014-08-30: qty 22

## 2014-08-30 MED ORDER — CHLORHEXIDINE GLUCONATE CLOTH 2 % EX PADS
6.0000 | MEDICATED_PAD | Freq: Every morning | CUTANEOUS | Status: DC
Start: 2014-08-31 — End: 2014-08-31

## 2014-08-30 MED ORDER — PRAVASTATIN SODIUM 80 MG PO TABS
80.0000 mg | ORAL_TABLET | Freq: Every day | ORAL | Status: DC
  Administered 2014-08-30: 80 mg via ORAL
  Filled 2014-08-30: qty 1

## 2014-08-30 MED ORDER — SODIUM CHLORIDE 0.9 % IJ SOLN
3.0000 mL | Freq: Two times a day (BID) | INTRAMUSCULAR | Status: DC
  Administered 2014-08-30: 3 mL via INTRAVENOUS

## 2014-08-30 MED ORDER — LEVOTHYROXINE SODIUM 75 MCG PO TABS
75.0000 ug | ORAL_TABLET | Freq: Every day | ORAL | Status: DC
  Filled 2014-08-30: qty 1

## 2014-08-30 MED ORDER — INSULIN GLARGINE 100 UNIT/ML ~~LOC~~ SOLN
10.0000 [IU] | Freq: Every day | SUBCUTANEOUS | Status: DC
  Administered 2014-08-30: 10 [IU] via SUBCUTANEOUS
  Filled 2014-08-30: qty 0.1

## 2014-08-30 NOTE — Telephone Encounter (Signed)
Wife called c/o patient having worsening of symptoms.  Weight up 8 more pounds since Monday when they called our office despite doubling lasix per Aundra Dubin.  Cannot get pants on and is unable to eat because abdomen has gotten so big.  States diuretics and "super pills" (metolazone) are not making him pee like they used to.  His ;ethargy and generalized swelling are also worse.  Added on to available slot this afternoon to work him up further.  [name]

## 2014-08-30 NOTE — Progress Notes (Signed)
Pt BP 91/44 (50) at 2200, 105/39 (59) at 2210,  103/32 (58) 2212, called Dr. Aundra Dubin, MD states pt baseline is lower. No new orders given at this time. Will continue to monitor.

## 2014-08-30 NOTE — H&P (Addendum)
Patient ID: [name], male   DOB: 1935-06-16, 79 y.o.   MRN: [id number]  PCP: Dr Ernestine Conrad GI: Dr Earlean Shawl.    HPI: [name] is a 79 yo male with a history of HTN, CAD s/p CABG, ICM s/p Medtronic ICD, chronic systolic heart failure, DM2, Vtach, atrial flutter, PVD and GI mass. ECHO (12/22/13) EF 20-25%, Echo  EF 40% (12/15)  Mod-severe [name].   On September 1st he was admitted from Dr. Rosezella [place] office with volume overload and low output heart failure. Creatinine was 2.8 on admit (baseline 1.6). Central line was placed to monitor CVP and CO-OX. Initial CO-OX was 49% and he was started on Milrinone and IV lasix. Discharge weight was 151 pounds. Creatinine was 1.42.   Saw Jeff Medoff  for possible colon mass/thickening and felt to be result of previous diverticulitis.   He returns for follow up. Increased weight 150 up to 159 pounds. SOB with exertion. Increased fatigue. Sleeping a lot. Denies dizziness. Poor appetite. Poor urine output with diuretics. No BRBPR.   Echo 12/15 EF  ~40% inferior AK moderate to severe [name]. RV mildly HK   ICD interrogation: Optivol Fluid index - Trending up. Activity down to < 2 hours.   Labs 02/09/14 K 3.9 Creatinine 1.38 Pro BNP 2555  Labs 02/20/14 K 3.5 NA 127 Creatinine 1.6  03/01/14 TSH 19.8  T4  1.03 T3 84  Labs 03/03/14 CEA 9.0  K 4.3 creatinine 1.2 NA 134 Hgb 12.7 TSH 19.8 T3 84.9 T4 1.03  Labs 07/27/2014: K 3.4 Creatinine 1.98  CT Virtual Colonoscopy- Diverticulosis ? Rectosigmoid Colon Ca  ROS: All systems negative except as listed in HPI, PMH and Problem List.  Past Medical History  Diagnosis Date  . VENTRICULAR TACHYCARDIA   . PERIPHERAL VASCULAR DISEASE   . HYPERTENSION     Hypertensive retinopathy-grade II OU  . HYPERCHOLESTEROLEMIA   . CORONARY ARTERY DISEASE     Hx of CABG  . Chronic systolic heart failure     Ischemic cardiomyopathy (01/2014 EF 40-45% with hypokinesis + small area of akinesis  . CAROTID ARTERY DISEASE   . SEBORRHEIC  DERMATITIS   . Chronic renal insufficiency, stage III (moderate)     CrCl 40s-50s  . DIVERTICULOSIS OF COLON   . COPD, severe   . COLONIC POLYPS   . ANXIETY   . ICD (implantable cardiac defibrillator) in place   . Type II diabetes mellitus     +background diabetic retinopathy OU  . Chronic lower back pain   . DJD (degenerative joint disease)     "hands" (05/24/2012)  . Osteoarthritis of finger   . History of atrial flutter   . Thrombus 12/2013    on ICD lead--started anticoag and his cardioversion for atrial flutter was postponed.  . Colon stricture 2015    with features worrisome for mass, also with recent rising CEA level (possible colon cancer)-GI MD= Dr. Franki Cabot recent eval/follow up with Dr. Earlean Shawl was summer 2014, at which time he recommended colonoscopy but pt declined.  . Macular degeneration, age related, nonexudative     OU    Current Outpatient Prescriptions  Medication Sig Dispense Refill  . amiodarone (PACERONE) 200 MG tablet Take 1 tablet (200 mg total) by mouth daily. 30 tablet 6  . apixaban (ELIQUIS) 5 MG TABS tablet Take 1 tablet (5 mg total) by mouth 2 (two) times daily. 60 tablet 6  . carvedilol (COREG) 6.25 MG tablet Take 1 tablet (6.25 mg total) by mouth  2 (two) times daily with a meal. 60 tablet 3  . Fluticasone-Salmeterol (ADVAIR) 500-50 MCG/DOSE AEPB Inhale 1 puff into the lungs 2 (two) times daily. 60 each 11  . furosemide (LASIX) 20 MG tablet Take 2 tablets (40 mg total) by mouth daily. May take additional 20 mg if weight is greater than 154lbs 60 tablet 6  . Guaifenesin (MUCINEX MAXIMUM STRENGTH) 1200 MG TB12 Take 1,200 mg by mouth 2 (two) times daily.     . hydrALAZINE (APRESOLINE) 50 MG tablet Take 1 tablet (50 mg total) by mouth every 8 (eight) hours. 90 tablet 6  . Insulin Glargine (LANTUS SOLOSTAR) 100 UNIT/ML Solostar Pen 10 units SQ qhs 15 mL 11  . insulin lispro (HUMALOG KWIKPEN) 100 UNIT/ML KiwkPen 7 U SQ w/BF, 8 U SQ w/Lunch, and 9 U SQ  w/Supper 15 mL 11  . isosorbide mononitrate (IMDUR) 30 MG 24 hr tablet Take 1 tablet (30 mg total) by mouth daily. 30 tablet 6  . levothyroxine (SYNTHROID, LEVOTHROID) 75 MCG tablet Take 1 tablet (75 mcg total) by mouth daily. 30 tablet 3  . metolazone (ZAROXOLYN) 2.5 MG tablet Take 1 tablet (2.5 mg total) by mouth once a week. Every Monday 10 tablet 3  . potassium chloride (K-DUR) 10 MEQ tablet Take 2 tablets (20 mEq total) by mouth daily. 60 tablet 3  . pravastatin (PRAVACHOL) 80 MG tablet Take 1 tablet (80 mg total) by mouth daily. 30 tablet 10  . tiotropium (SPIRIVA) 18 MCG inhalation capsule Place 18 mcg into inhaler and inhale every evening. Between 4 & 6 pm     No current facility-administered medications for this encounter.     PHYSICAL EXAM: Filed Vitals:   08/30/14 1431  BP: 88/52  Pulse: 59  Weight: 164 lb 12.8 oz (74.753 kg)  SpO2: 89%    General:  Chronically ill appearing. No resp difficulty HEENT: normal Neck: supple. JVP to jaw. Carotids 2+ bilaterally; no bruits. No lymphadenopathy or thryomegaly appreciated. Cor: PMI normal. Regular rate & rhythm. No rubs, gallops or murmurs. Lungs: clear Abdomen: soft, nontender, nondistended. No hepatosplenomegaly. No bruits or masses. Good bowel sounds. Extremities: no cyanosis, clubbing, rash, R and LLE 3+ edema Neuro: alert & orientedx3, cranial nerves grossly intact. Moves all 4 extremities w/o difficulty. Affect pleasant.  ICD interrogation shows Optivol trending up. No VT/AF. Activity < 2 hours.   ASSESSMENT & PLAN: [name] is a 79 year old with ischemic cardiomyopathy and chronic systolic heart failure recently admitted with cardiogenic shock that required short term milrinone. 1. Chronic Systolic Heart Failure: Ischemic cardiomyopathy. Has Medtronic ICD. ECHO 12/15 EF 40%. NYHA IIIb -Volume status elevated. Goal weight at home probably in 147-149 range. Up 10-15 pounds.  -Give 80 mg IV lasix twice a day.  - Cut  back carvedilol to 3.125 mg twice a day. -Hold hydralazine/imdur - He is not Ace or spiro in past due to CKD. Can reconsider in future if BP allows.  2. A fib/Aflutter: Maintaining SR.  Continue amiodarone 200 mg daily. Recent LFTs ok. Will need yearly eye exams.  Hold eliquis tonight for RHC tomorrow.  3. Mitral regurgitation - moderate to severe due to restriction of posterior MV leaflet. We discussed the possibility of TEE and possible repair. However, I think a major surgery like that could potentially be a major setback for him especially with his COPD. He is currently doing quite well. We will follow closely with surveillance echos and if LV dilating then will plan TEE  to further evaluate.  4. CKD: Stable III. Check labs today.  5. DM: Per PCP.  6. CAD: s/p CABG.  Continue pravastatin.  Not on ASA with apixaban use.   7. COPD 8. H/o VT: He has an ICD and is now on amiodarone.   Admit today for volume overload.Admit to stepdown.  Plan for [place] may be low output.  CLEGG,AMY NP-C  3:05 PM   Patient seen with NP, agree with the above note.  He is very volume overloaded on exam with low blood pressure (88/52), oxygen saturation 89%, and 13 lb weight gain.  He needs admission for diuresis and RHC to assess cardiac output and filling pressures.  Will cut back Coreg and hold hydralazine/Imdur for now, restart meds if BP stable.  Lasix 80 mg IV bid for now. Echo to reassess EF and [name]. ECG and CXR now.   [name] 08/30/2014 3:44 PM

## 2014-08-30 NOTE — Progress Notes (Signed)
Patient ID: [name], male   DOB: [birth year], 79 y.o.   MRN: [id number]  PCP: Dr Ernestine Conrad GI: Dr Earlean Shawl.    HPI: [name] is a 79 yo male with a history of HTN, CAD s/p CABG, ICM s/p Medtronic ICD, chronic systolic heart failure, DM2, Vtach, atrial flutter, PVD and GI mass. ECHO (12/22/13) EF 20-25%, Echo  EF 40% (12/15)  Mod-severe MR.   On September 1st he was admitted from Dr. Rosezella [place] office with volume overload and low output heart failure. Creatinine was 2.8 on admit (baseline 1.6). Central line was placed to monitor CVP and CO-OX. Initial CO-OX was 49% and he was started on Milrinone and IV lasix. Discharge weight was 151 pounds. Creatinine was 1.42.   Saw Jeff Medoff  for possible colon mass/thickening and felt to be result of previous diverticulitis.   He returns for follow up. Increased weight 150 up to 159 pounds. SOB with exertion. Increased fatigue. Sleeping a lot. Denies dizziness. Poor appetite. Poor urine output with diuretics. No BRBPR.   Echo 12/15 EF  ~40% inferior AK moderate to severe MR. RV mildly HK   ICD interrogation: Optivol Fluid index - Trending up. Activity down to < 2 hours.   Labs 02/09/14 K 3.9 Creatinine 1.38 Pro BNP 2555  Labs 02/20/14 K 3.5 NA 127 Creatinine 1.6  03/01/14 TSH 19.8  T4  1.03 T3 84  Labs 03/03/14 CEA 9.0  K 4.3 creatinine 1.2 NA 134 Hgb 12.7 TSH 19.8 T3 84.9 T4 1.03  Labs 07/27/2014: K 3.4 Creatinine 1.98  CT Virtual Colonoscopy- Diverticulosis ? Rectosigmoid Colon Ca  ROS: All systems negative except as listed in HPI, PMH and Problem List.  Past Medical History  Diagnosis Date  . VENTRICULAR TACHYCARDIA   . PERIPHERAL VASCULAR DISEASE   . HYPERTENSION     Hypertensive retinopathy-grade II OU  . HYPERCHOLESTEROLEMIA   . CORONARY ARTERY DISEASE     Hx of CABG  . Chronic systolic heart failure     Ischemic cardiomyopathy (01/2014 EF 40-45% with hypokinesis + small area of akinesis  . CAROTID ARTERY DISEASE   . SEBORRHEIC  DERMATITIS   . Chronic renal insufficiency, stage III (moderate)     CrCl 40s-50s  . DIVERTICULOSIS OF COLON   . COPD, severe   . COLONIC POLYPS   . ANXIETY   . ICD (implantable cardiac defibrillator) in place   . Type II diabetes mellitus     +background diabetic retinopathy OU  . Chronic lower back pain   . DJD (degenerative joint disease)     "hands" (05/24/2012)  . Osteoarthritis of finger   . History of atrial flutter   . Thrombus 12/2013    on ICD lead--started anticoag and his cardioversion for atrial flutter was postponed.  . Colon stricture 2015    with features worrisome for mass, also with recent rising CEA level (possible colon cancer)-GI MD= Dr. Franki Cabot recent eval/follow up with Dr. Earlean Shawl was summer 2014, at which time he recommended colonoscopy but pt declined.  . Macular degeneration, age related, nonexudative     OU    Current Outpatient Prescriptions  Medication Sig Dispense Refill  . amiodarone (PACERONE) 200 MG tablet Take 1 tablet (200 mg total) by mouth daily. 30 tablet 6  . apixaban (ELIQUIS) 5 MG TABS tablet Take 1 tablet (5 mg total) by mouth 2 (two) times daily. 60 tablet 6  . carvedilol (COREG) 6.25 MG tablet Take 1 tablet (6.25 mg total) by mouth  2 (two) times daily with a meal. 60 tablet 3  . Fluticasone-Salmeterol (ADVAIR) 500-50 MCG/DOSE AEPB Inhale 1 puff into the lungs 2 (two) times daily. 60 each 11  . furosemide (LASIX) 20 MG tablet Take 2 tablets (40 mg total) by mouth daily. May take additional 20 mg if weight is greater than 154lbs 60 tablet 6  . Guaifenesin (MUCINEX MAXIMUM STRENGTH) 1200 MG TB12 Take 1,200 mg by mouth 2 (two) times daily.     . hydrALAZINE (APRESOLINE) 50 MG tablet Take 1 tablet (50 mg total) by mouth every 8 (eight) hours. 90 tablet 6  . Insulin Glargine (LANTUS SOLOSTAR) 100 UNIT/ML Solostar Pen 10 units SQ qhs 15 mL 11  . insulin lispro (HUMALOG KWIKPEN) 100 UNIT/ML KiwkPen 7 U SQ w/BF, 8 U SQ w/Lunch, and 9 U SQ  w/Supper 15 mL 11  . isosorbide mononitrate (IMDUR) 30 MG 24 hr tablet Take 1 tablet (30 mg total) by mouth daily. 30 tablet 6  . levothyroxine (SYNTHROID, LEVOTHROID) 75 MCG tablet Take 1 tablet (75 mcg total) by mouth daily. 30 tablet 3  . metolazone (ZAROXOLYN) 2.5 MG tablet Take 1 tablet (2.5 mg total) by mouth once a week. Every Monday 10 tablet 3  . potassium chloride (K-DUR) 10 MEQ tablet Take 2 tablets (20 mEq total) by mouth daily. 60 tablet 3  . pravastatin (PRAVACHOL) 80 MG tablet Take 1 tablet (80 mg total) by mouth daily. 30 tablet 10  . tiotropium (SPIRIVA) 18 MCG inhalation capsule Place 18 mcg into inhaler and inhale every evening. Between 4 & 6 pm     No current facility-administered medications for this encounter.     PHYSICAL EXAM: Filed Vitals:   08/30/14 1431  BP: 88/52  Pulse: 59  Weight: 164 lb 12.8 oz (74.753 kg)  SpO2: 89%    General:  Chronically ill appearing. No resp difficulty HEENT: normal Neck: supple. JVP to jaw. Carotids 2+ bilaterally; no bruits. No lymphadenopathy or thryomegaly appreciated. Cor: PMI normal. Regular rate & rhythm. No rubs, gallops or murmurs. Lungs: clear Abdomen: soft, nontender, nondistended. No hepatosplenomegaly. No bruits or masses. Good bowel sounds. Extremities: no cyanosis, clubbing, rash, R and LLE 3+ edema Neuro: alert & orientedx3, cranial nerves grossly intact. Moves all 4 extremities w/o difficulty. Affect pleasant.  ICD interrogation shows Optivol trending up. No VT/AF. Activity < 2 hours.   ASSESSMENT & PLAN: Mr [name] is a 79 year old with ischemic cardiomyopathy and chronic systolic heart failure recently admitted with cardiogenic shock that required short term milrinone. 1. Chronic Systolic Heart Failure: Ischemic cardiomyopathy. Has Medtronic ICD. ECHO 12/15 EF 40%. NYHA IIIb -Volume status elevated. Goal weight at home probably in 147-149 range. Up 10-15 pounds.  -Give 80 mg IV lasix twice a day.  - Cut  back carvedilol to 3.125 mg twice a day. -Hold hydralazine/imdur - He is not Ace or spiro in past due to CKD. Can reconsider in future if BP allows.  2. A fib/Aflutter: Maintaining SR.  Continue amiodarone 200 mg daily. Recent LFTs ok. Will need yearly eye exams.  Hold eliquis tonight for RHC tomorrow.  3. Mitral regurgitation - moderate to severe due to restriction of posterior MV leaflet. We discussed the possibility of TEE and possible repair. However, I think a major surgery like that could potentially be a major setback for him especially with his COPD. He is currently doing quite well. We will follow closely with surveillance echos and if LV dilating then will plan TEE  to further evaluate.  4. CKD: Stable III. Check labs today.  5. DM: Per PCP.  6. CAD: s/p CABG.  Continue pravastatin.  Not on ASA with apixaban use.   7. COPD 8. H/o VT: He has an ICD and is now on amiodarone.   Admit today for volume overload.Admit to stepdown.  Plan for [place] may be low output.  CLEGG,AMY NP-C  3:05 PM   Patient seen with NP, agree with the above note.  He is very volume overloaded on exam with low blood pressure (88/52), oxygen saturation 89%, and 13 lb weight gain.  He needs admission for diuresis and RHC to assess cardiac output and filling pressures.  Will cut back Coreg and hold hydralazine/Imdur for now, restart meds if BP stable.  Lasix 80 mg IV bid for now. Echo to reassess EF and MR. ECG and CXR now.   [name] 08/30/2014 3:44 PM

## 2014-08-30 NOTE — H&P (Signed)
Advanced Heart Failure H& P  PCP: Dr Ernestine Conrad GI: Dr Earlean Shawl.   HPI: [name] is a 79 yo male with a history of HTN, CAD s/p CABG, ICM s/p Medtronic ICD, chronic systolic heart failure, DM2, Vtach, atrial flutter, PVD and GI mass. ECHO (12/22/13) EF 20-25%, Echo EF 40% (12/15) Mod-severe [name].   On September 1st he was admitted from Dr. Rosezella [place] office with volume overload and low output heart failure. Creatinine was 2.8 on admit (baseline 1.6). Central line was placed to monitor CVP and CO-OX. Initial CO-OX was 49% and he was started on Milrinone and IV lasix. Discharge weight was 151 pounds. Creatinine was 1.42.   Saw Jeff Medoff for possible colon mass/thickening and felt to be result of previous diverticulitis.   He returns for follow up. Increased weight 150 up to 159 pounds. SOB with exertion. Increased fatigue. Sleeping a lot. Denies dizziness. Poor appetite. Poor urine output with diuretics. No BRBPR.   Echo 12/15 EF ~40% inferior AK moderate to severe [name]. RV mildly HK  ICD interrogation: Optivol Fluid index - Trending up. Activity down to < 2 hours.   Labs 02/09/14 K 3.9 Creatinine 1.38 Pro BNP 2555  Labs 02/20/14 K 3.5 NA 127 Creatinine 1.6  03/01/14 TSH 19.8 T4 1.03 T3 84  Labs 03/03/14 CEA 9.0 K 4.3 creatinine 1.2 NA 134 Hgb 12.7 TSH 19.8 T3 84.9 T4 1.03  Labs 07/27/2014: K 3.4 Creatinine 1.98  CT Virtual Colonoscopy- Diverticulosis ? Rectosigmoid Colon Ca  Family History  Problem Relation Age of Onset  . Heart attack Father 65  . Heart attack Brother     Vague history   History   Social History  . Marital Status: Married    Spouse Name: N/A  . Number of Children: N/A  . Years of Education: N/A   Occupational History  . Not on file.   Social History Main Topics  . Smoking status: Former Smoker -- 2.00 packs/day for 50 years    Types: Cigarettes    Quit date: 06/24/2000  . Smokeless tobacco: Never Used  . Alcohol Use: 0.0 oz/week    0  Standard drinks or equivalent per week     Comment: 05/24/2012 "used to drink alot; quit > 10 yr ago"  . Drug Use: No  . Sexual Activity: No   Other Topics Concern  . Not on file   Social History Narrative   ROS: All systems negative except as listed in HPI, PMH and Problem List.  Past Medical History  Diagnosis Date  . VENTRICULAR TACHYCARDIA   . PERIPHERAL VASCULAR DISEASE   . HYPERTENSION     Hypertensive retinopathy-grade II OU  . HYPERCHOLESTEROLEMIA   . CORONARY ARTERY DISEASE     Hx of CABG  . Chronic systolic heart failure     Ischemic cardiomyopathy (01/2014 EF 40-45% with hypokinesis + small area of akinesis  . CAROTID ARTERY DISEASE   . SEBORRHEIC DERMATITIS   . Chronic renal insufficiency, stage III (moderate)     CrCl 40s-50s  . DIVERTICULOSIS OF COLON   . COPD, severe   . COLONIC POLYPS   . ANXIETY   . ICD (implantable cardiac defibrillator) in place   . Type II diabetes mellitus     +background diabetic retinopathy OU  . Chronic lower back pain   . DJD (degenerative joint disease)     "hands" (05/24/2012)  . Osteoarthritis of finger   . History  of atrial flutter   . Thrombus 12/2013    on ICD lead--started anticoag and his cardioversion for atrial flutter was postponed.  . Colon stricture 2015    with features worrisome for mass, also with recent rising CEA level (possible colon cancer)-GI MD= Dr. Franki Cabot recent eval/follow up with Dr. Earlean Shawl was summer 2014, at which time he recommended colonoscopy but pt declined.  . Macular degeneration, age related, nonexudative     OU    Current Outpatient Prescriptions  Medication Sig Dispense Refill  . amiodarone (PACERONE) 200 MG tablet Take 1 tablet (200 mg total) by mouth daily. 30 tablet 6  . apixaban (ELIQUIS) 5 MG TABS tablet Take 1 tablet (5 mg total) by mouth 2 (two) times daily. 60 tablet  6  . carvedilol (COREG) 6.25 MG tablet Take 1 tablet (6.25 mg total) by mouth 2 (two) times daily with a meal. 60 tablet 3  . Fluticasone-Salmeterol (ADVAIR) 500-50 MCG/DOSE AEPB Inhale 1 puff into the lungs 2 (two) times daily. 60 each 11  . furosemide (LASIX) 20 MG tablet Take 2 tablets (40 mg total) by mouth daily. May take additional 20 mg if weight is greater than 154lbs 60 tablet 6  . Guaifenesin (MUCINEX MAXIMUM STRENGTH) 1200 MG TB12 Take 1,200 mg by mouth 2 (two) times daily.     . hydrALAZINE (APRESOLINE) 50 MG tablet Take 1 tablet (50 mg total) by mouth every 8 (eight) hours. 90 tablet 6  . Insulin Glargine (LANTUS SOLOSTAR) 100 UNIT/ML Solostar Pen 10 units SQ qhs 15 mL 11  . insulin lispro (HUMALOG KWIKPEN) 100 UNIT/ML KiwkPen 7 U SQ w/BF, 8 U SQ w/Lunch, and 9 U SQ w/Supper 15 mL 11  . isosorbide mononitrate (IMDUR) 30 MG 24 hr tablet Take 1 tablet (30 mg total) by mouth daily. 30 tablet 6  . levothyroxine (SYNTHROID, LEVOTHROID) 75 MCG tablet Take 1 tablet (75 mcg total) by mouth daily. 30 tablet 3  . metolazone (ZAROXOLYN) 2.5 MG tablet Take 1 tablet (2.5 mg total) by mouth once a week. Every Monday 10 tablet 3  . potassium chloride (K-DUR) 10 MEQ tablet Take 2 tablets (20 mEq total) by mouth daily. 60 tablet 3  . pravastatin (PRAVACHOL) 80 MG tablet Take 1 tablet (80 mg total) by mouth daily. 30 tablet 10  . tiotropium (SPIRIVA) 18 MCG inhalation capsule Place 18 mcg into inhaler and inhale every evening. Between 4 & 6 pm     No current facility-administered medications for this encounter.     PHYSICAL EXAM: Filed Vitals:   08/30/14 1431  BP: 88/52  Pulse: 59  Weight: 164 lb 12.8 oz (74.753 kg)  SpO2: 89%    General: Chronically ill appearing. No resp difficulty HEENT: normal Neck: supple. JVP to jaw. Carotids 2+ bilaterally; no bruits. No lymphadenopathy or thryomegaly appreciated. Cor:  PMI normal. Regular rate & rhythm. No rubs, gallops or murmurs. Lungs: clear Abdomen: soft, nontender, nondistended. No hepatosplenomegaly. No bruits or masses. Good bowel sounds. Extremities: no cyanosis, clubbing, rash, R and LLE 3+ edema Neuro: alert & orientedx3, cranial nerves grossly intact. Moves all 4 extremities w/o difficulty. Affect pleasant.  ICD interrogation shows Optivol trending up. No VT/AF. Activity < 2 hours.   ASSESSMENT & PLAN: [name] is a 79 year old with ischemic cardiomyopathy and chronic systolic heart failure recently admitted with cardiogenic shock that required short term milrinone. 1. Chronic Systolic Heart Failure: Ischemic cardiomyopathy. Has Medtronic ICD. ECHO 12/15 EF 40%. NYHA IIIb -Volume status  elevated. Goal weight at home probably in 147-149 range. Up 10-15 pounds.  -Give 80 mg IV lasix twice a day.  - Cut back carvedilol to 3.125 mg twice a day. -Hold hydralazine/imdur - He is not Ace or spiro in past due to CKD. Can reconsider in future if BP allows.  2. A fib/Aflutter: Maintaining SR. Continue amiodarone 200 mg daily. Recent LFTs ok. Will need yearly eye exams.  Hold eliquis tonight for RHC tomorrow.  3. Mitral regurgitation - moderate to severe due to restriction of posterior MV leaflet. We discussed the possibility of TEE and possible repair. However, I think a major surgery like that could potentially be a major setback for him especially with his COPD. He is currently doing quite well. We will follow closely with surveillance echos and if LV dilating then will plan TEE to further evaluate.  4. CKD: Stable III. Check labs today.  5. DM: Per PCP.  6. CAD: s/p CABG. Continue pravastatin. Not on ASA with apixaban use.  7. COPD 8. H/o VT: He has an ICD and is now on amiodarone.   Admit today for volume overload.Admit to stepdown. Plan for [place] may be low output.  CLEGG,AMY NP-C  3:05 PM   Patient seen with NP, agree with the  above note. He is very volume overloaded on exam with low blood pressure (88/52), oxygen saturation 89%, and 13 lb weight gain. He needs admission for diuresis and RHC to assess cardiac output and filling pressures. Will cut back Coreg and hold hydralazine/Imdur for now, restart meds if BP stable. Lasix 80 mg IV bid for now. Echo to reassess EF and [name]. ECG and CXR now.   [name] 08/30/2014 3:44 PM

## 2014-08-31 ENCOUNTER — Inpatient Hospital Stay (HOSPITAL_COMMUNITY): Payer: Medicare Other

## 2014-08-31 ENCOUNTER — Encounter (HOSPITAL_COMMUNITY)
Admission: EM | Disposition: A | Discharge status: Home / Self Care | Payer: Self-pay | Source: Ambulatory Visit | Attending: Cardiology

## 2014-08-31 ENCOUNTER — Encounter (HOSPITAL_COMMUNITY): Payer: Self-pay | Admitting: *Deleted

## 2014-08-31 ENCOUNTER — Inpatient Hospital Stay (HOSPITAL_COMMUNITY)
Admission: EM | Admit: 2014-08-31 | Discharge: 2014-09-05 | Disposition: A | Discharge status: Home / Self Care | Payer: Medicare Other | Source: Ambulatory Visit | Attending: Cardiology | Admitting: Cardiology

## 2014-08-31 DIAGNOSIS — N183 Chronic kidney disease, stage 3 unspecified: Secondary | ICD-10-CM | POA: Insufficient documentation

## 2014-08-31 DIAGNOSIS — I5021 Acute systolic (congestive) heart failure: Secondary | ICD-10-CM | POA: Diagnosis present

## 2014-08-31 DIAGNOSIS — N179 Acute kidney failure, unspecified: Secondary | ICD-10-CM

## 2014-08-31 DIAGNOSIS — I4891 Unspecified atrial fibrillation: Secondary | ICD-10-CM

## 2014-08-31 DIAGNOSIS — D500 Iron deficiency anemia secondary to blood loss (chronic): Secondary | ICD-10-CM | POA: Insufficient documentation

## 2014-08-31 DIAGNOSIS — I5022 Chronic systolic (congestive) heart failure: Secondary | ICD-10-CM

## 2014-08-31 DIAGNOSIS — Z452 Encounter for adjustment and management of vascular access device: Secondary | ICD-10-CM

## 2014-08-31 DIAGNOSIS — I509 Heart failure, unspecified: Secondary | ICD-10-CM

## 2014-08-31 HISTORY — PX: RIGHT HEART CATHETERIZATION: SHX5447

## 2014-08-31 LAB — PROTIME-INR
INR: 2.45 — ABNORMAL HIGH (ref 0.00–1.49)
PROTHROMBIN TIME: 26.8 s — AB (ref 11.6–15.2)

## 2014-08-31 LAB — BASIC METABOLIC PANEL
Anion gap: 12 (ref 5–15)
BUN: 71 mg/dL — ABNORMAL HIGH (ref 6–23)
CALCIUM: 8.4 mg/dL (ref 8.4–10.5)
CO2: 26 mmol/L (ref 19–32)
Chloride: 90 mmol/L — ABNORMAL LOW (ref 96–112)
Creatinine, Ser: 2.83 mg/dL — ABNORMAL HIGH (ref 0.50–1.35)
GFR, EST AFRICAN AMERICAN: 23 mL/min — AB (ref >90)
GFR, EST NON AFRICAN AMERICAN: 20 mL/min — AB (ref >90)
GLUCOSE: 127 mg/dL — AB (ref 70–99)
POTASSIUM: 3.7 mmol/L (ref 3.5–5.1)
Sodium: 128 mmol/L — ABNORMAL LOW (ref 135–145)

## 2014-08-31 LAB — APTT: aPTT: 55 seconds — ABNORMAL HIGH (ref 24–37)

## 2014-08-31 LAB — POCT I-STAT 3, VENOUS BLOOD GAS (G3P V)
ACID-BASE EXCESS: 3 mmol/L — AB (ref 0.0–2.0)
Acid-Base Excess: 3 mmol/L — ABNORMAL HIGH (ref 0.0–2.0)
BICARBONATE: 27.4 meq/L — AB (ref 20.0–24.0)
BICARBONATE: 27.4 meq/L — AB (ref 20.0–24.0)
O2 Saturation: 61 %
O2 Saturation: 65 %
PCO2 VEN: 39.2 mmHg — AB (ref 45.0–50.0)
PH VEN: 7.449 — AB (ref 7.250–7.300)
PO2 VEN: 32 mmHg (ref 30.0–45.0)
TCO2: 29 mmol/L (ref 0–100)
TCO2: 29 mmol/L (ref 0–100)
pCO2, Ven: 39.5 mmHg — ABNORMAL LOW (ref 45.0–50.0)
pH, Ven: 7.453 — ABNORMAL HIGH (ref 7.250–7.300)
pO2, Ven: 30 mmHg (ref 30.0–45.0)

## 2014-08-31 LAB — CBC WITH DIFFERENTIAL/PLATELET
BASOS PCT: 0 % (ref 0–1)
Basophils Absolute: 0 10*3/uL (ref 0.0–0.1)
EOS PCT: 1 % (ref 0–5)
Eosinophils Absolute: 0.1 10*3/uL (ref 0.0–0.7)
HCT: 25.8 % — ABNORMAL LOW (ref 39.0–52.0)
Hemoglobin: 8.5 g/dL — ABNORMAL LOW (ref 13.0–17.0)
LYMPHS ABS: 1.7 10*3/uL (ref 0.7–4.0)
Lymphocytes Relative: 17 % (ref 12–46)
MCH: 27.5 pg (ref 26.0–34.0)
MCHC: 32.9 g/dL (ref 30.0–36.0)
MCV: 83.5 fL (ref 78.0–100.0)
Monocytes Absolute: 1.2 10*3/uL — ABNORMAL HIGH (ref 0.1–1.0)
Monocytes Relative: 12 % (ref 3–12)
NEUTROS PCT: 70 % (ref 43–77)
Neutro Abs: 6.9 10*3/uL (ref 1.7–7.7)
PLATELETS: 159 10*3/uL (ref 150–400)
RBC: 3.09 MIL/uL — AB (ref 4.22–5.81)
RDW: 17.4 % — ABNORMAL HIGH (ref 11.5–15.5)
WBC: 9.8 10*3/uL (ref 4.0–10.5)

## 2014-08-31 LAB — TSH: TSH: 6.759 u[IU]/mL — ABNORMAL HIGH (ref 0.350–4.500)

## 2014-08-31 LAB — IRON AND TIBC
Iron: 41 ug/dL — ABNORMAL LOW (ref 42–165)
Saturation Ratios: 11 % — ABNORMAL LOW (ref 20–55)
TIBC: 357 ug/dL (ref 215–435)
UIBC: 316 ug/dL (ref 125–400)

## 2014-08-31 LAB — GLUCOSE, CAPILLARY: Glucose-Capillary: 114 mg/dL — ABNORMAL HIGH (ref 70–99)

## 2014-08-31 LAB — CARBOXYHEMOGLOBIN
Carboxyhemoglobin: 2.3 % — ABNORMAL HIGH (ref 0.5–1.5)
Methemoglobin: 1.2 % (ref 0.0–1.5)
O2 Saturation: 67.3 %
Total hemoglobin: 7.2 g/dL — ABNORMAL LOW (ref 13.5–18.0)

## 2014-08-31 LAB — FERRITIN: FERRITIN: 42 ng/mL (ref 22–322)

## 2014-08-31 SURGERY — RIGHT HEART CATH
Anesthesia: LOCAL

## 2014-08-31 MED ORDER — TIOTROPIUM BROMIDE MONOHYDRATE 18 MCG IN CAPS
18.0000 ug | ORAL_CAPSULE | Freq: Every evening | RESPIRATORY_TRACT | Status: DC
  Administered 2014-08-31 – 2014-09-04 (×5): 18 ug via RESPIRATORY_TRACT
  Filled 2014-08-31 (×2): qty 5

## 2014-08-31 MED ORDER — INSULIN GLARGINE 100 UNIT/ML SOLOSTAR PEN: 10.0000 [IU] | PEN_INJECTOR | Freq: Every day | SUBCUTANEOUS | Status: DC

## 2014-08-31 MED ORDER — INSULIN LISPRO 100 UNIT/ML (KWIKPEN): 7.0000 [IU] | PEN_INJECTOR | Freq: Three times a day (TID) | SUBCUTANEOUS | Status: DC

## 2014-08-31 MED ORDER — INSULIN GLARGINE 100 UNIT/ML ~~LOC~~ SOLN
10.0000 [IU] | Freq: Every day | SUBCUTANEOUS | Status: DC
  Administered 2014-08-31 – 2014-09-03 (×3): 10 [IU] via SUBCUTANEOUS
  Filled 2014-08-31 (×6): qty 0.1

## 2014-08-31 MED ORDER — ONDANSETRON HCL 4 MG/2ML IJ SOLN: 4.0000 mg | Freq: Four times a day (QID) | INTRAMUSCULAR | Status: DC | PRN

## 2014-08-31 MED ORDER — INSULIN ASPART 100 UNIT/ML ~~LOC~~ SOLN: 0.0000 [IU] | Freq: Every day | SUBCUTANEOUS | Status: DC

## 2014-08-31 MED ORDER — SODIUM CHLORIDE 0.9 % IJ SOLN: 3.0000 mL | INTRAMUSCULAR | Status: DC | PRN

## 2014-08-31 MED ORDER — ASPIRIN 81 MG PO CHEW
81.0000 mg | CHEWABLE_TABLET | ORAL | Status: AC
  Administered 2014-08-31: 81 mg via ORAL
  Filled 2014-08-31: qty 1

## 2014-08-31 MED ORDER — MILRINONE IN DEXTROSE 20 MG/100ML IV SOLN
0.2500 ug/kg/min | INTRAVENOUS | Status: DC
  Administered 2014-08-31: 0.25 ug/kg/min via INTRAVENOUS
  Filled 2014-08-31: qty 100

## 2014-08-31 MED ORDER — CHLORHEXIDINE GLUCONATE CLOTH 2 % EX PADS
6.0000 | MEDICATED_PAD | Freq: Every morning | CUTANEOUS | Status: AC
  Administered 2014-08-31 – 2014-09-04 (×3): 6 via TOPICAL

## 2014-08-31 MED ORDER — POTASSIUM CHLORIDE ER 10 MEQ PO TBCR
20.0000 meq | EXTENDED_RELEASE_TABLET | Freq: Every day | ORAL | Status: DC
  Administered 2014-08-31: 20 meq via ORAL
  Filled 2014-08-31 (×2): qty 2

## 2014-08-31 MED ORDER — HEPARIN SODIUM (PORCINE) 5000 UNIT/ML IJ SOLN: 5000.0000 [IU] | Freq: Three times a day (TID) | INTRAMUSCULAR | Status: AC

## 2014-08-31 MED ORDER — ONDANSETRON HCL 4 MG/2ML IJ SOLN
4.0000 mg | Freq: Four times a day (QID) | INTRAMUSCULAR | Status: DC | PRN
  Administered 2014-09-01: 4 mg via INTRAVENOUS
  Filled 2014-08-31: qty 2

## 2014-08-31 MED ORDER — ACETAMINOPHEN 325 MG PO TABS
650.0000 mg | ORAL_TABLET | ORAL | Status: DC | PRN
Start: 2014-08-31 — End: 2014-08-31

## 2014-08-31 MED ORDER — SODIUM CHLORIDE 0.9 % IV SOLN: 250.0000 mL | INTRAVENOUS | Status: DC | PRN

## 2014-08-31 MED ORDER — MILRINONE IN DEXTROSE 20 MG/100ML IV SOLN: 0.2500 ug/kg/min | INTRAVENOUS | Status: DC

## 2014-08-31 MED ORDER — INSULIN ASPART 100 UNIT/ML ~~LOC~~ SOLN
7.0000 [IU] | Freq: Every day | SUBCUTANEOUS | Status: DC
Start: 2014-08-31 — End: 2014-09-05
  Administered 2014-09-01 – 2014-09-05 (×5): 7 [IU] via SUBCUTANEOUS

## 2014-08-31 MED ORDER — APIXABAN 2.5 MG PO TABS
2.5000 mg | ORAL_TABLET | Freq: Two times a day (BID) | ORAL | Status: DC
  Administered 2014-08-31: 2.5 mg via ORAL
  Filled 2014-08-31 (×3): qty 1

## 2014-08-31 MED ORDER — HEPARIN (PORCINE) IN NACL 2-0.9 UNIT/ML-% IJ SOLN
INTRAMUSCULAR | Status: AC
  Filled 2014-08-31: qty 500

## 2014-08-31 MED ORDER — MAGNESIUM SULFATE 2 GM/50ML IV SOLN
2.0000 g | Freq: Once | INTRAVENOUS | Status: AC
  Administered 2014-08-31: 2 g via INTRAVENOUS
  Filled 2014-08-31: qty 50

## 2014-08-31 MED ORDER — FUROSEMIDE 10 MG/ML IJ SOLN: 40.0000 mg | Freq: Three times a day (TID) | INTRAMUSCULAR | Status: DC

## 2014-08-31 MED ORDER — ACETAMINOPHEN 325 MG PO TABS: 650.0000 mg | ORAL_TABLET | ORAL | Status: DC | PRN

## 2014-08-31 MED ORDER — FUROSEMIDE 10 MG/ML IJ SOLN
40.0000 mg | Freq: Two times a day (BID) | INTRAMUSCULAR | Status: DC
  Administered 2014-08-31: 40 mg via INTRAVENOUS
  Filled 2014-08-31 (×2): qty 4

## 2014-08-31 MED ORDER — NOREPINEPHRINE BITARTRATE 1 MG/ML IV SOLN
0.0000 ug/min | INTRAVENOUS | Status: DC
  Administered 2014-08-31: 2 ug/min via INTRAVENOUS
  Administered 2014-08-31: 5 ug/min via INTRAVENOUS
  Filled 2014-08-31 (×3): qty 4

## 2014-08-31 MED ORDER — SODIUM CHLORIDE 0.9 % IJ SOLN
3.0000 mL | Freq: Two times a day (BID) | INTRAMUSCULAR | Status: DC
  Administered 2014-09-01: 30 mL via INTRAVENOUS

## 2014-08-31 MED ORDER — INSULIN ASPART 100 UNIT/ML ~~LOC~~ SOLN
9.0000 [IU] | Freq: Every day | SUBCUTANEOUS | Status: DC
  Administered 2014-08-31 – 2014-09-04 (×4): 9 [IU] via SUBCUTANEOUS

## 2014-08-31 MED ORDER — SODIUM CHLORIDE 0.9 % IV SOLN
INTRAVENOUS | Status: DC
  Administered 2014-08-31 – 2014-09-01 (×2): via INTRAVENOUS
  Administered 2014-09-03: 10 mL/h via INTRAVENOUS

## 2014-08-31 MED ORDER — INSULIN ASPART 100 UNIT/ML ~~LOC~~ SOLN
8.0000 [IU] | Freq: Every day | SUBCUTANEOUS | Status: DC
  Administered 2014-09-01 – 2014-09-04 (×3): 8 [IU] via SUBCUTANEOUS

## 2014-08-31 MED ORDER — HEPARIN SODIUM (PORCINE) 5000 UNIT/ML IJ SOLN
5000.0000 [IU] | Freq: Three times a day (TID) | INTRAMUSCULAR | Status: DC
  Administered 2014-08-31: 5000 [IU] via SUBCUTANEOUS
  Filled 2014-08-31 (×4): qty 1

## 2014-08-31 MED ORDER — INSULIN ASPART 100 UNIT/ML ~~LOC~~ SOLN
0.0000 [IU] | Freq: Three times a day (TID) | SUBCUTANEOUS | Status: DC
  Administered 2014-08-31 – 2014-09-04 (×6): 2 [IU] via SUBCUTANEOUS
  Administered 2014-09-04: 3 [IU] via SUBCUTANEOUS
  Administered 2014-09-05: 2 [IU] via SUBCUTANEOUS

## 2014-08-31 MED ORDER — GUAIFENESIN ER 600 MG PO TB12
1200.0000 mg | ORAL_TABLET | Freq: Two times a day (BID) | ORAL | Status: DC
  Administered 2014-08-31 – 2014-09-05 (×10): 1200 mg via ORAL
  Filled 2014-08-31 (×12): qty 2

## 2014-08-31 MED ORDER — LEVOTHYROXINE SODIUM 75 MCG PO TABS
75.0000 ug | ORAL_TABLET | Freq: Every day | ORAL | Status: DC
  Administered 2014-08-31 – 2014-09-05 (×6): 75 ug via ORAL
  Filled 2014-08-31 (×7): qty 1

## 2014-08-31 MED ORDER — MOMETASONE FURO-FORMOTEROL FUM 200-5 MCG/ACT IN AERO
2.0000 | INHALATION_SPRAY | Freq: Two times a day (BID) | RESPIRATORY_TRACT | Status: DC
  Administered 2014-08-31 – 2014-09-05 (×10): 2 via RESPIRATORY_TRACT
  Filled 2014-08-31: qty 8.8

## 2014-08-31 MED ORDER — LIDOCAINE HCL (PF) 1 % IJ SOLN
INTRAMUSCULAR | Status: AC
  Filled 2014-08-31: qty 30

## 2014-08-31 MED ORDER — SODIUM CHLORIDE 0.9 % IJ SOLN
3.0000 mL | Freq: Two times a day (BID) | INTRAMUSCULAR | Status: DC
  Administered 2014-08-31: 10:00:00 via INTRAVENOUS

## 2014-08-31 MED ORDER — MIDAZOLAM HCL 2 MG/2ML IJ SOLN
INTRAMUSCULAR | Status: AC
  Filled 2014-08-31: qty 2

## 2014-08-31 MED ORDER — AMIODARONE HCL 200 MG PO TABS
200.0000 mg | ORAL_TABLET | Freq: Every day | ORAL | Status: DC
  Administered 2014-08-31 – 2014-09-05 (×5): 200 mg via ORAL
  Filled 2014-08-31 (×6): qty 1

## 2014-08-31 MED ORDER — SODIUM CHLORIDE 0.9 % IJ SOLN
3.0000 mL | Freq: Two times a day (BID) | INTRAMUSCULAR | Status: DC
  Administered 2014-08-31: 11:00:00 via INTRAVENOUS

## 2014-08-31 MED ORDER — MILRINONE IN DEXTROSE 20 MG/100ML IV SOLN
0.1250 ug/kg/min | INTRAVENOUS | Status: DC
  Administered 2014-08-31 – 2014-09-02 (×3): 0.25 ug/kg/min via INTRAVENOUS
  Administered 2014-09-03: 0.125 ug/kg/min via INTRAVENOUS
  Filled 2014-08-31 (×4): qty 100

## 2014-08-31 MED ORDER — APIXABAN 5 MG PO TABS
5.0000 mg | ORAL_TABLET | Freq: Two times a day (BID) | ORAL | Status: DC
  Filled 2014-08-31: qty 1

## 2014-08-31 MED ORDER — CARVEDILOL 3.125 MG PO TABS
3.1250 mg | ORAL_TABLET | Freq: Two times a day (BID) | ORAL | Status: DC
  Filled 2014-08-31 (×2): qty 1

## 2014-08-31 MED ORDER — PRAVASTATIN SODIUM 80 MG PO TABS
80.0000 mg | ORAL_TABLET | Freq: Every day | ORAL | Status: DC
  Administered 2014-08-31 – 2014-09-04 (×4): 80 mg via ORAL
  Filled 2014-08-31 (×6): qty 1

## 2014-08-31 MED ORDER — FENTANYL CITRATE 0.05 MG/ML IJ SOLN
INTRAMUSCULAR | Status: AC
  Filled 2014-08-31: qty 2

## 2014-08-31 MED ORDER — MUPIROCIN 2 % EX OINT
1.0000 "application " | TOPICAL_OINTMENT | Freq: Two times a day (BID) | CUTANEOUS | Status: AC
  Administered 2014-08-31 – 2014-09-04 (×9): 1 via NASAL
  Filled 2014-08-31: qty 22

## 2014-08-31 MED ORDER — FUROSEMIDE 10 MG/ML IJ SOLN
80.0000 mg | Freq: Two times a day (BID) | INTRAMUSCULAR | Status: DC
  Filled 2014-08-31 (×2): qty 8

## 2014-08-31 NOTE — Progress Notes (Addendum)
Advanced Heart Failure Rounding Note   Subjective:   [name] is a 79 yo male with a history of HTN, CAD s/p CABG, ICM s/p Medtronic ICD, chronic systolic heart failure, DM2, Vtach, atrial flutter, PVD and GI mass. ECHO (12/22/13) EF 20-25%, Echo EF 40% (12/15) Mod-severe MR.   Admitted from HF clinic with volume overload. Started on 80 mg IV lasix. Marginal urine output noted.   Creatinine 2.88> 2.83    Objective:   Weight Range:  Vital Signs:   Temp:  [97.5 F (36.4 C)] 97.5 F (36.4 C) (04/07 0400) Pulse Rate:  [48-54] 48 (04/07 0500) Resp:  [9-14] 14 (04/07 0500) BP: (96-127)/(36-46) 112/40 mmHg (04/07 0500) SpO2:  [95 %-99 %] 97 % (04/07 0500) Weight:  [159 lb 13.3 oz (72.5 kg)] 159 lb 13.3 oz (72.5 kg) (04/07 0252)    Weight change: Filed Weights   08/31/14 0252  Weight: 159 lb 13.3 oz (72.5 kg)    Intake/Output:   Intake/Output Summary (Last 24 hours) at 08/31/14 0704 Last data filed at 08/31/14 0500  Gross per 24 hour  Intake      0 ml  Output    700 ml  Net   -700 ml    PHYSICAL EXAM General: Chronically ill appearing. No resp difficulty HEENT: normal Neck: supple. JVP to jaw. Carotids 2+ bilaterally; no bruits. No lymphadenopathy or thryomegaly appreciated. Cor: PMI normal. Regular rate & rhythm. No rubs, gallops or murmurs. Lungs: clear Abdomen: soft, nontender, nondistended. No hepatosplenomegaly. No bruits or masses. Good bowel sounds. Extremities: no cyanosis, clubbing, rash, R and LLE 3+ edema Neuro: alert & orientedx3, cranial nerves grossly intact. Moves all 4 extremities w/o difficulty. Affect pleasant.  Telemetry: Sinus Brady 50s   Labs: Basic Metabolic Panel:  Recent Labs Lab 08/30/14 1638 08/31/14 0450  NA 128* 128*  K 4.1 3.7  CL 89* 90*  CO2 28 26  GLUCOSE 179* 127*  BUN 71* 71*  CREATININE 2.88* 2.83*  CALCIUM 8.4 8.4  MG 1.9  --     Liver Function Tests: No results for input(s): AST, ALT, ALKPHOS, BILITOT, PROT,  ALBUMIN in the last 168 hours. No results for input(s): LIPASE, AMYLASE in the last 168 hours. No results for input(s): AMMONIA in the last 168 hours.  CBC:  Recent Labs Lab 08/30/14 1638  WBC 9.8  HGB 8.6*  HCT 26.1*  MCV 84.2  PLT 134*    Cardiac Enzymes: No results for input(s): CKTOTAL, CKMB, CKMBINDEX, TROPONINI in the last 168 hours.  BNP: BNP (last 3 results)  Recent Labs  06/07/14 1421 08/30/14 1639  BNP 528.4* 800.7*    ProBNP (last 3 results)  Recent Labs  08/31/13 1322 01/24/14 1909 02/09/14 0945  PROBNP 230.0* 6721.0* 2555.0*      Other results:  Imaging:  No results found.   Medications:     Scheduled Medications: . amiodarone  200 mg Oral Daily  . carvedilol  3.125 mg Oral BID  . Chlorhexidine Gluconate Cloth  6 each Topical q morning - 10a  . furosemide  80 mg Intravenous BID  . guaiFENesin  1,200 mg Oral BID  . heparin  5,000 Units Subcutaneous 3 times per day  . insulin aspart  0-15 Units Subcutaneous TID WC  . insulin aspart  0-5 Units Subcutaneous QHS  . insulin aspart  7 Units Subcutaneous Q breakfast   And  . insulin aspart  8 Units Subcutaneous Q lunch   And  . insulin aspart  9 Units Subcutaneous Q supper  . insulin glargine  10 Units Subcutaneous QHS  . levothyroxine  75 mcg Oral QAC breakfast  . mometasone-formoterol  2 puff Inhalation BID  . mupirocin ointment  1 application Nasal BID  . potassium chloride  20 mEq Oral Daily  . pravastatin  80 mg Oral q1800  . sodium chloride  3 mL Intravenous Q12H  . sodium chloride  3 mL Intravenous Q12H  . tiotropium  18 mcg Inhalation QPM     Infusions:     PRN Medications:  sodium chloride, sodium chloride, acetaminophen, ondansetron (ZOFRAN) IV, sodium chloride, sodium chloride   Assessment/Plan   Mr [name] is a 79 year old with ischemic cardiomyopathy and acute/chronic systolic heart failure admitted with volume overload and suspected low output.  1. A/C  Systolic Heart Failure: Ischemic cardiomyopathy. Diuresing with IV lasix. Urine output marginal after 80 mg IV lasix.  - Stop Coreg with possible low output and bradycardia. - Hold Lasix until after cath, suspect he will need milrinone and can restart diuresis then.  - No ACE/dig/Spiro with CKD.  - Repeat ECHO  - Plan for RHC this am.  2. PAF: on amiodarone 200 mg daily. Eliquis on hold for cath 3. Mitral Regurgitation: repeat ECHO 4. AKI on CKD: Suspect cardiorenal.  5. DM- Hgb A1C pending. On home regimen + sliding scale 6. CAD S/P CABG 7. COPD 8. H/O VT:  ICD interrogation yesterday no VT.  9. Hypothyroidism 10. Anemia: FOBT, check iron studies.   Length of Stay: 0  CLEGG,AMY NP-C  08/31/2014, 7:04 AM  Advanced Heart Failure Team Pager [phone] (M-F; 7a - 4p)  Please contact [facility] for night-coverage after hours (4p -7a ) and weekends on [email or web address]  Patient seen with NP, agree with the above note.  Creatinine up to 2.8 this admission, well above his baseline.  Poor diuresis on IV Lasix.  Sinus brady in 50s.  Suspect low output with marked volume overload.  - Hold Coreg, hydral/Imdur for now.  - Hold Lasix for now. Suspect he will need milrinone with diuresis.  - RHC this morning, last dose of Eliquis yesterday, would prefer to use brachial access.   [name] 08/31/2014 7:30 AM

## 2014-08-31 NOTE — Progress Notes (Signed)
cxr reviiewed - ok to use cvl  Dr. Brand Males, M.D., [facility].C.P Pulmonary and Critical Care Medicine Staff Physician [place] [facility] Pager: [phone], If no answer or between  15:00h - 7:00h: call [phone]  08/31/2014 5:02 PM

## 2014-08-31 NOTE — Procedures (Addendum)
Central Venous Catheter Insertion Procedure Note ULIS KAPS [id number] 1935-12-01  Procedure: Insertion of Central Venous Catheter Indications: Assessment of intravascular volume and Frequent blood sampling  Procedure Details Consent: Risks of procedure as well as the alternatives and risks of each were explained to the (patient/caregiver).  Consent for procedure obtained. Time Out: Verified patient identification, verified procedure, site/side was marked, verified correct patient position, special equipment/implants available, medications/allergies/relevent history reviewed, required imaging and test results available.  Performed  Maximum sterile technique was used including antiseptics, cap, gloves, gown, hand hygiene, mask and sheet. Skin prep: Chlorhexidine; local anesthetic administered A antimicrobial bonded/coated triple lumen catheter was placed in the right internal jugular vein using the Seldinger technique. Ultrasound guidance used.Yes.   Catheter placed to 16 cm. Blood aspirated via all 3 ports and then flushed x 3. Line sutured x 2 and dressing applied.  Evaluation Blood flow good Complications: No apparent complications Patient did tolerate procedure well. Chest X-ray ordered to verify placement.  CXR: pending.  Georgann Housekeeper, AGACNP-BC [facility] Pulmonology/Critical Care Pager [phone] or [phone]  08/31/2014 1:21 PM   [employer] [place]  Lavon Paganini. Titus Mould, MD, [place] Pgr: [place] [facility]

## 2014-08-31 NOTE — Consult Note (Signed)
PULMONARY / CRITICAL CARE MEDICINE   Name: [name] MRN: [id number] DOB: 02-27-1936    ADMISSION DATE:  08/31/2014 CONSULTATION DATE:  4/7  REFERRING MD :  Aundra Dubin   CHIEF COMPLAINT:  Hypotension   INITIAL PRESENTATION:   79 yo male with a history of HTN, CAD s/p CABG, ICM s/p Medtronic ICD, chronic systolic heart failure, DM2, Vtach, atrial flutter, PVD and GI mass.Echo EF 40% (12/15) Mod-severe MR. Admitted 4/6 w/ decompensated HF. PCCM asked to see on 4/7 to assist w/ line placement and management of what was felt to be biventricular heart failure w/ element of primary pulmonary venous HTN.   STUDIES:  Right heart cath 4/7: RA mean 14; RV 76/9/18; PA 72/17, mean 37; PCWP mean 20 with prominent V-waves. Oxygen saturations:PA 61%;AO 97%; Cardiac Output (Fick) 5.88  Cardiac Index (Fick) 3.16;PVR 2.9. primarily pulmonary venous hypertension. Elevated RVEDP and RA pressure, CVP/PCWP = 0.7 suggests RV > LV failure ECHO 4/7>>>  SIGNIFICANT EVENTS:    HISTORY OF PRESENT ILLNESS:   79 yo male with a history of HTN, CAD s/p CABG, ICM s/p Medtronic ICD, chronic systolic heart failure, DM2, Vtach, atrial flutter, PVD and GI mass. ECHO (12/22/13) EF 20-25%, Echo EF 40% (12/15) Mod-severe MR. On September 1st he was admitted from Dr. Rosezella [place] office with volume overload and low output heart failure. Creatinine was 2.8 on admit (baseline 1.6). Central line was placed to monitor CVP and CO-OX. Initial CO-OX was 49% and he was started on Milrinone and IV lasix. Discharge weight was 151 pounds. Creatinine was 1.42. He returned for follow up on 4/6. Increased weight 150 up to 159 pounds. SOB with exertion. Increased fatigue. Sleeping a lot. Denied dizziness. Poor appetite. Poor urine output with diuretics. No BRBPR. He was admitted for treatment of decompensated HF and volume overload w/ plan to have right heart cath given known mod-svr MR. He underwent right heart cath on 4/7 w/ findings now  suggesting possibly element of primary pulmonary venous HTN w/ right heart failure. On return to ICU he was found to be hypotensive. PCCM consulted initially for CVL placement only and then when he required levophed to support his BP we were asked to assist w/ his care formally.    PAST MEDICAL HISTORY :   has a past medical history of VENTRICULAR TACHYCARDIA; PERIPHERAL VASCULAR DISEASE; HYPERTENSION; HYPERCHOLESTEROLEMIA; CORONARY ARTERY DISEASE; Chronic systolic heart failure; CAROTID ARTERY DISEASE; SEBORRHEIC DERMATITIS; Chronic renal insufficiency, stage III (moderate); DIVERTICULOSIS OF COLON; COPD, severe; COLONIC POLYPS; ANXIETY; ICD (implantable cardiac defibrillator) in place; Type II diabetes mellitus; Chronic lower back pain; DJD (degenerative joint disease); Osteoarthritis of finger; History of atrial flutter; Thrombus (12/2013); Colon stricture (2015); and Macular degeneration, age related, nonexudative.  has past surgical history that includes Coronary artery bypass graft (1990's); Tonsillectomy (?1942); Cardiac defibrillator placement (12/2004); TEE without cardioversion (N/A, 12/22/2013); Cardioversion (N/A, 12/22/2013); Cardiovascular stress test (01/12/13); gastric emptying scan (02/02/14); transthoracic echocardiogram (01/25/14; 04/2014); Abdominal ultrasound (01/2014); CT virtual colonoscopy diagnostic (11/2012); Cardiovascular stress test; and implantable cardioverter defibrillator revision (N/A, 05/25/2012). Prior to Admission medications   Medication Sig Start Date End Date Taking? Authorizing Provider  amiodarone (PACERONE) 200 MG tablet Take 1 tablet (200 mg total) by mouth daily. 02/09/14   Amy D Ninfa Meeker, NP  apixaban (ELIQUIS) 5 MG TABS tablet Take 1 tablet (5 mg total) by mouth 2 (two) times daily. 02/03/14   Amy D Clegg, NP  carvedilol (COREG) 3.125 MG tablet Take 3.125 mg by mouth 2 (two)  times daily. 06/01/14   Historical Provider, MD  carvedilol (COREG) 6.25 MG tablet Take 1 tablet (6.25  mg total) by mouth 2 (two) times daily with a meal. 06/07/14   Jolaine Artist, MD  chlorhexidine (PERIDEX) 0.12 % solution  08/03/14   Historical Provider, MD  Fluticasone-Salmeterol (ADVAIR) 500-50 MCG/DOSE AEPB Inhale 1 puff into the lungs 2 (two) times daily. 04/05/14   Noralee Space, MD  furosemide (LASIX) 20 MG tablet Take 2 tablets (40 mg total) by mouth daily. May take additional 20 mg if weight is greater than 154lbs 05/09/14   Jolaine Artist, MD  Guaifenesin [facility] MAXIMUM STRENGTH) 1200 MG TB12 Take 1,200 mg by mouth 2 (two) times daily.     Historical Provider, MD  hydrALAZINE (APRESOLINE) 50 MG tablet Take 1 tablet (50 mg total) by mouth every 8 (eight) hours. 02/03/14   Amy D Ninfa Meeker, NP  HYDROcodone-acetaminophen (NORCO/VICODIN) 5-325 MG per tablet  08/03/14   Historical Provider, MD  Insulin Glargine (LANTUS SOLOSTAR) 100 UNIT/ML Solostar Pen 10 units SQ qhs 02/10/14   Tammi Sou, MD  insulin lispro (HUMALOG KWIKPEN) 100 UNIT/ML KiwkPen 7 U SQ w/BF, 8 U SQ w/Lunch, and 9 U SQ w/Supper 08/02/14   Tammi Sou, MD  isosorbide mononitrate (IMDUR) 30 MG 24 hr tablet Take 1 tablet (30 mg total) by mouth daily. 02/03/14   Amy D Clegg, NP  KLOR-CON M10 10 MEQ tablet Take 10 mEq by mouth 2 (two) times daily. 08/02/14   Historical Provider, MD  levothyroxine (SYNTHROID, LEVOTHROID) 75 MCG tablet Take 1 tablet (75 mcg total) by mouth daily. 05/12/14   Tammi Sou, MD  metolazone (ZAROXOLYN) 2.5 MG tablet Take 1 tablet (2.5 mg total) by mouth once a week. Every Monday 06/07/14   Jolaine Artist, MD  NOVOLOG FLEXPEN 100 UNIT/ML FlexPen  06/20/14   Historical Provider, MD  potassium chloride (K-DUR) 10 MEQ tablet Take 2 tablets (20 mEq total) by mouth daily. 05/09/14   Jolaine Artist, MD  pravastatin (PRAVACHOL) 80 MG tablet Take 1 tablet (80 mg total) by mouth daily. 03/06/14   Minus Breeding, MD  tiotropium (SPIRIVA) 18 MCG inhalation capsule Place 18 mcg into inhaler and inhale  every evening. Between 4 & 6 pm    Historical Provider, MD   Allergies  Allergen Reactions  . Niacin Itching    Niaspan     FAMILY HISTORY:  has no family status information on file.  SOCIAL HISTORY:  reports that he quit smoking about 14 years ago. His smoking use included Cigarettes. He has a 100 pack-year smoking history. He has never used smokeless tobacco. He reports that he drinks alcohol. He reports that he does not use illicit drugs.  REVIEW OF SYSTEMS:     SUBJECTIVE:  No distress.  VITAL SIGNS: Temp:  [97.4 F (36.3 C)-98 F (36.7 C)] 97.5 F (36.4 C) (04/07 0400) Pulse Rate:  [48-59] 56 (04/07 0748) Resp:  [8-20] 11 (04/07 0700) BP: (88-158)/(36-97) 110/40 mmHg (04/07 0700) SpO2:  [89 %-100 %] 99 % (04/07 0937) Weight:  [72.5 kg (159 lb 13.3 oz)-75.3 kg (166 lb 0.1 oz)] 72.5 kg (159 lb 13.3 oz) (04/07 0252) HEMODYNAMICS:   VENTILATOR SETTINGS:   INTAKE / OUTPUT:  Intake/Output Summary (Last 24 hours) at 08/31/14 1153 Last data filed at 08/31/14 1103  Gross per 24 hour  Intake      3 ml  Output    700 ml  Net   -  697 ml    PHYSICAL EXAMINATION: General:  Awake, alert, no distress.  Neuro:  No focal def  HEENT:  [place], no JVD  Cardiovascular:  Regular irreg, loud blowing systolic murmur best heard at left HB.  Lungs:  Clear to ausc Abdomen:  Soft, non-tender no OM  Musculoskeletal:  Intact, good strength  Skin:  LE edema. Scattered areas of ecchymosis   LABS:  CBC  Recent Labs Lab 08/30/14 1638  WBC 9.8  HGB 8.6*  HCT 26.1*  PLT 134*   Coag's  Recent Labs Lab 08/30/14 2230 08/31/14 0452  APTT  --  55*  INR 2.40* 2.45*   BMET  Recent Labs Lab 08/30/14 1638 08/31/14 0450  NA 128* 128*  K 4.1 3.7  CL 89* 90*  CO2 28 26  BUN 71* 71*  CREATININE 2.88* 2.83*  GLUCOSE 179* 127*   Electrolytes  Recent Labs Lab 08/30/14 1638 08/31/14 0450  CALCIUM 8.4 8.4  MG 1.9  --    Sepsis Markers No results for input(s): LATICACIDVEN,  PROCALCITON, O2SATVEN in the last 168 hours. ABG No results for input(s): PHART, PCO2ART, PO2ART in the last 168 hours. Liver Enzymes No results for input(s): AST, ALT, ALKPHOS, BILITOT, ALBUMIN in the last 168 hours. Cardiac Enzymes No results for input(s): TROPONINI, PROBNP in the last 168 hours. Glucose  Recent Labs Lab 08/30/14 2113  GLUCAP 238*    Imaging No results found.   ASSESSMENT / PLAN:  PULMONARY OETT A: Mild pulmonary infiltrates -slight edema P:   Wean FIO2 Negative fluid balance as tolerated   CARDIOVASCULAR CVL A:  Acute on chronic Biventricular decompensated heart failure Cardiogenic shock Pulmonary venous HTN  H/o VT PAF P:  Place CVL Cont levophed Start milrinone and lasix Trend CVP and co-ox per HF team Resume Eloquis after no further invasive procedures .  F/u echo  Ck lactic acid   RENAL A:   Acute on chronic renal failure in setting of decompensated heart failure  Hyponatremia  Volume overload P:   Maximize CO strict I&O  Avoid nephro-toxins Renal dose meds Hold Ace-I  GASTROINTESTINAL A:   No acute  P:   Diet as tolerated   HEMATOLOGIC A:   Normocytic/normochromic anemia w/out evidence of bleeding  Coagulopathy INR elevated. On Eloquis not coumadin. ? Hepatic congestion  P:  Resume eloquis after procedures complete  Ck LFTs Trend CBC  F/u anemia panel already ordered.   INFECTIOUS A:  No acute  P:   Trend fever and WBC curve   ENDOCRINE A:  DM Hyperglycemia  P:   Cont synthroid and ssi   NEUROLOGIC A:   No acute  P:   Supportive care   TODAY'S SUMMARY:  Decompensated biventricular HF w/ Pulmonary venous HTN. WIll place line, try to maximize Volume status w/ diuretics and inotropic/pressor support as needed.   Erick Colace ACNP-BC [place] Pager # [phone] OR # [phone] if no answer   08/31/2014, 11:53 AM   STAFF NOTE: I, Merrie Roof, MD FACP have personally  reviewed patient's available data, including medical history, events of note, physical examination and test results as part of my evaluation. I have discussed with resident/NP and other care providers such as pharmacist, RN and RRT. In addition, I personally evaluated patient and elicited key findings of: alert, no distress, pcxr unimpressive significant edema, line required, placed, aline required poor accuracy prior, BNP noted, no role or need NIMV at this stage, for neg balance and close  observation renal fxn, hope inotropic support, levo improves renal FXN, appears as low output shock, index not that bad however, MR is an issue, follow response to milrinone, tolerated procedures with inr The patient is critically ill with multiple organ systems failure and requires high complexity decision making for assessment and support, frequent evaluation and titration of therapies, application of advanced monitoring technologies and extensive interpretation of multiple databases.   Critical Care Time devoted to patient care services described in this note is30 Minutes. This time reflects time of care of this signee: Merrie Roof, MD FACP. This critical care time does not reflect procedure time, or teaching time or supervisory time of PA/NP/Med student/Med Resident etc but could involve care discussion time. Rest per NP/medical resident whose note is outlined above and that I agree with   Lavon Paganini. Titus Mould, MD, [place] Pgr: [place] [facility] 08/31/2014 1:23 PM

## 2014-08-31 NOTE — Progress Notes (Signed)
Per Dr. Chase Caller may access [facility] for use.

## 2014-08-31 NOTE — Progress Notes (Signed)
Echocardiogram 2D Echocardiogram has been performed.  [name] 08/31/2014, 11:51 AM

## 2014-08-31 NOTE — Procedures (Signed)
Arterial Catheter Insertion Procedure Note IMANI SHERRIN [id number] 04-Jul-1935  Procedure: Insertion of Arterial Catheter  Indications: Blood pressure monitoring  Procedure Details Consent: Risks of procedure as well as the alternatives and risks of each were explained to the (patient/caregiver).  Consent for procedure obtained. Time Out: Verified patient identification, verified procedure, site/side was marked, verified correct patient position, special equipment/implants available, medications/allergies/relevent history reviewed, required imaging and test results available.  Performed  Maximum sterile technique was used including antiseptics, cap, gloves, gown, hand hygiene, mask and sheet. Skin prep: Chlorhexidine; local anesthetic administered 20 gauge catheter was inserted into left radial artery using the Seldinger technique.  Evaluation Blood flow good; BP tracing good. Complications: No apparent complications.   Georgann Housekeeper, AGACNP-BC [facility] Pulmonology/Critical Care Pager [phone] or [phone]  08/31/2014 1:20 PM    Required Shock  Lavon Paganini. Titus Mould, MD, [place] Pgr: [place] [facility]

## 2014-08-31 NOTE — CV Procedure (Signed)
    Cardiac Catheterization Procedure Note  Name: [name] MRN: [id number] DOB: 1935-12-26  Procedure: Right Heart Cath  Indication: CHF, concern for low output.    Procedural Details: The right and left brachial areas were prepped, draped, and anesthetized with 1% lidocaine. We used brachial access given recent apixaban use.  There were pre-existing IVs in the right and left brachial areas.  Both were replaced with 5 French venous sheaths.  We were unable to pass a wire into the central circulation on the right so the left side was used A Swan-Ganz catheter was used for the right heart catheterization. Standard protocol was followed for recording of right heart pressures and sampling of oxygen saturations. Fick cardiac output was calculated. There were no immediate procedural complications. The patient was transferred to the post catheterization recovery area for further monitoring.  Procedural Findings: Hemodynamics (mmHg) RA mean 14 RV 76/9/18 PA 72/17, mean 37 PCWP mean 20 with prominent V-waves  Oxygen saturations: PA 61% AO 97%  Cardiac Output (Fick) 5.88  Cardiac Index (Fick) 3.16  PVR 2.9  Final Conclusions:  Somewhat surprising hemodynamics.  PCWP not markedly elevated but there were prominent v-waves suggestive of MR. PA systolic pressure elevated with wide pulse pressure (no history of significant PI) and PVR not significantly elevated, suggests primarily pulmonary venous hypertension.  Elevated RVEDP and RA pressure, CVP/PCWP = 0.7 suggests RV > LV failure.    Will need echo today to assess LV and RV function and MR.  Given rise in BUN/creatinine in the setting of dyspnea and volume overload (weight up about 15 lbs), I think that I will try milrinone at low dose for primarily RV support and try to diurese gently.   - Milrinone 0.25 mcg/kg/min - Lasix 40 mg IV every 8 hrs after milrinone begun - Place PICC for CVP/co-ox monitoring.   [name] 08/31/2014,  9:06 AM

## 2014-08-31 NOTE — Progress Notes (Signed)
  Called by nursing staff regarding SBP in 70s. Milrinone has not been started. Will add norepi to maintain MAP greater than 60 and SBP > 90.   Hold off on milrinone and lasix until norepi started.   PCCM consulted for central access ASAP.   Dr Aundra Dubin aware and agrees with plan.   CLEGG,AMY NP-C  11:07 AM   CVL and arterial line placed.  Arterial line reads much higher than the cuff on the leg.  Wil switch cuff to arm.  Will begin titrating off norepinephrine.  Will set up CVP and send off co-ox.  Will go ahead and add low dose milrinone for RV support in setting of severe pulmonary hypertension (mixed, primarily pulmonary venous).  - Milrinone 0.25 mcg/kg/min - Lasix 40 mg IV bid, start this evening.  - Hemoglobin stable, can restart Eliquis as planned this evening, use 2.5 mg bid for now.   [name] 08/31/2014 2:58 PM

## 2014-09-01 ENCOUNTER — Encounter (HOSPITAL_COMMUNITY): Payer: Self-pay | Admitting: Pulmonary Disease

## 2014-09-01 DIAGNOSIS — N189 Chronic kidney disease, unspecified: Secondary | ICD-10-CM

## 2014-09-01 DIAGNOSIS — I5021 Acute systolic (congestive) heart failure: Secondary | ICD-10-CM

## 2014-09-01 DIAGNOSIS — J449 Chronic obstructive pulmonary disease, unspecified: Secondary | ICD-10-CM

## 2014-09-01 DIAGNOSIS — N183 Chronic kidney disease, stage 3 (moderate): Secondary | ICD-10-CM

## 2014-09-01 DIAGNOSIS — E1122 Type 2 diabetes mellitus with diabetic chronic kidney disease: Secondary | ICD-10-CM

## 2014-09-01 DIAGNOSIS — D500 Iron deficiency anemia secondary to blood loss (chronic): Secondary | ICD-10-CM

## 2014-09-01 LAB — GLUCOSE, CAPILLARY: Glucose-Capillary: 118 mg/dL — ABNORMAL HIGH (ref 70–99)

## 2014-09-01 LAB — HEMOGLOBIN A1C
Hgb A1c MFr Bld: 6 % — ABNORMAL HIGH (ref 4.8–5.6)
MEAN PLASMA GLUCOSE: 126 mg/dL

## 2014-09-01 LAB — BASIC METABOLIC PANEL WITH GFR
Anion gap: 12 (ref 5–15)
BUN: 67 mg/dL — ABNORMAL HIGH (ref 6–23)
CO2: 27 mmol/L (ref 19–32)
Calcium: 8.4 mg/dL (ref 8.4–10.5)
Chloride: 90 mmol/L — ABNORMAL LOW (ref 96–112)
Creatinine, Ser: 2.69 mg/dL — ABNORMAL HIGH (ref 0.50–1.35)
GFR calc Af Amer: 25 mL/min — ABNORMAL LOW (ref >90)
GFR calc non Af Amer: 21 mL/min — ABNORMAL LOW (ref >90)
Glucose, Bld: 144 mg/dL — ABNORMAL HIGH (ref 70–99)
Potassium: 3.5 mmol/L (ref 3.5–5.1)
Sodium: 129 mmol/L — ABNORMAL LOW (ref 135–145)

## 2014-09-01 LAB — CBC
HCT: 22.8 % — ABNORMAL LOW (ref 39.0–52.0)
HCT: 21.4 % — ABNORMAL LOW (ref 39.0–52.0)
HEMOGLOBIN: 7.3 g/dL — AB (ref 13.0–17.0)
Hemoglobin: 7.7 g/dL — ABNORMAL LOW (ref 13.0–17.0)
MCH: 27.9 pg (ref 26.0–34.0)
MCH: 28.2 pg (ref 26.0–34.0)
MCHC: 33.8 g/dL (ref 30.0–36.0)
MCHC: 34.1 g/dL (ref 30.0–36.0)
MCV: 82.6 fL (ref 78.0–100.0)
MCV: 82.6 fL (ref 78.0–100.0)
PLATELETS: 150 10*3/uL (ref 150–400)
PLATELETS: 121 10*3/uL — AB (ref 150–400)
RBC: 2.76 MIL/uL — ABNORMAL LOW (ref 4.22–5.81)
RBC: 2.59 MIL/uL — ABNORMAL LOW (ref 4.22–5.81)
RDW: 17.2 % — AB (ref 11.5–15.5)
RDW: 17.4 % — ABNORMAL HIGH (ref 11.5–15.5)
WBC: 9 10*3/uL (ref 4.0–10.5)
WBC: 9.4 10*3/uL (ref 4.0–10.5)

## 2014-09-01 LAB — CARBOXYHEMOGLOBIN
Carboxyhemoglobin: 2.5 % — ABNORMAL HIGH (ref 0.5–1.5)
METHEMOGLOBIN: 1.5 % (ref 0.0–1.5)
O2 Saturation: 75 %
TOTAL HEMOGLOBIN: 8 g/dL — AB (ref 13.5–18.0)

## 2014-09-01 LAB — PREPARE RBC (CROSSMATCH)

## 2014-09-01 LAB — ABO/RH: ABO/RH(D): O POS

## 2014-09-01 MED ORDER — FUROSEMIDE 40 MG PO TABS
40.0000 mg | ORAL_TABLET | Freq: Every day | ORAL | Status: DC
  Administered 2014-09-01: 40 mg via ORAL
  Filled 2014-09-01: qty 1

## 2014-09-01 MED ORDER — POTASSIUM CHLORIDE ER 10 MEQ PO TBCR
40.0000 meq | EXTENDED_RELEASE_TABLET | Freq: Every day | ORAL | Status: DC
  Administered 2014-09-01 – 2014-09-05 (×4): 40 meq via ORAL
  Filled 2014-09-01 (×6): qty 4

## 2014-09-01 MED ORDER — FUROSEMIDE 10 MG/ML IJ SOLN
80.0000 mg | Freq: Two times a day (BID) | INTRAMUSCULAR | Status: DC
  Administered 2014-09-01 – 2014-09-02 (×2): 80 mg via INTRAVENOUS
  Filled 2014-09-01 (×3): qty 8

## 2014-09-01 MED ORDER — SODIUM CHLORIDE 0.9 % IV SOLN
Freq: Once | INTRAVENOUS | Status: AC
  Administered 2014-09-01: 20:00:00 via INTRAVENOUS

## 2014-09-01 MED ORDER — SODIUM CHLORIDE 0.9 % IV SOLN
510.0000 mg | Freq: Once | INTRAVENOUS | Status: AC
  Administered 2014-09-01: 510 mg via INTRAVENOUS
  Filled 2014-09-01: qty 17

## 2014-09-01 MED ORDER — FUROSEMIDE 10 MG/ML IJ SOLN
80.0000 mg | Freq: Once | INTRAMUSCULAR | Status: AC
  Administered 2014-09-01: 80 mg via INTRAVENOUS
  Filled 2014-09-01: qty 8

## 2014-09-01 MED ORDER — NOREPINEPHRINE BITARTRATE 1 MG/ML IV SOLN
0.0000 ug/min | INTRAVENOUS | Status: DC
  Administered 2014-09-01: 5 ug/min via INTRAVENOUS
  Filled 2014-09-01: qty 16

## 2014-09-01 NOTE — Progress Notes (Signed)
PULMONARY / CRITICAL CARE MEDICINE   Name: [name] MRN: [id number] DOB: 06-20-1935    ADMISSION DATE:  08/31/2014 CONSULTATION DATE:  08/31/2014  REFERRING MD :  Aundra Dubin   CHIEF COMPLAINT:  Hypotension   INITIAL PRESENTATION:   79 yo male with a history of HTN, CAD s/p CABG, ICM s/p Medtronic ICD, chronic systolic heart failure, DM2, Vtach, atrial flutter, PVD and GI mass.Echo EF 40% (12/15) Mod-severe MR. Admitted 4/6 w/ decompensated HF. PCCM asked to see on 4/7 to assist w/ line placement and management of what was felt to be biventricular heart failure w/ element of primary pulmonary venous HTN.   STUDIES:  Spirometry 08/31/13 >> FEV1 0.69 (24%), FEV1% 31 Right heart cath 4/7 >> RA 76/9/18, PAOP 20, CI 3.16, PVR 2.9 ECHO 4/7 >> EF 40 to 73%, grade 2 diastolic dysfx, mod/severe MR, mod/severe LA dilation, PAS 78 mmHg  SIGNIFICANT EVENTS:  SUBJECTIVE:  Breathing better.  Denies chest/abd pain.  Urine outpt improved.  VITAL SIGNS: Temp:  [97.5 F (36.4 C)-98.6 F (37 C)] 98.6 F (37 C) (04/08 0400) Pulse Rate:  [50-58] 57 (04/08 0700) Resp:  [8-18] 11 (04/08 0700) BP: (55-127)/(18-66) 127/35 mmHg (04/07 1500) SpO2:  [86 %-100 %] 98 % (04/08 0700) Arterial Line BP: (106-165)/(33-56) 106/34 mmHg (04/08 0700) Weight:  [160 lb 7.9 oz (72.8 kg)-162 lb 0.6 oz (73.5 kg)] 160 lb 7.9 oz (72.8 kg) (04/08 0403) HEMODYNAMICS: CVP:  [8 mmHg-16 mmHg] 10 mmHg VENTILATOR SETTINGS:   INTAKE / OUTPUT:  Intake/Output Summary (Last 24 hours) at 09/01/14 0932 Last data filed at 09/01/14 0700  Gross per 24 hour  Intake 742.58 ml  Output   2275 ml  Net -1532.42 ml    PHYSICAL EXAMINATION: General: pleasant Neuro:  Normal strength HEENT:  No sinus tenderness Cardiovascular: irregular, 2/6 SM Lungs: no wheeze Abdomen:  Soft, non-tender Musculoskeletal: 1+ edema Skin: multiple areas of ecchymosis  LABS:  CBC  Recent Labs Lab 08/30/14 1638 08/31/14 1216 09/01/14 0430   WBC 9.8 9.8 9.0  HGB 8.6* 8.5* 7.7*  HCT 26.1* 25.8* 22.8*  PLT 134* 159 150   Coag's  Recent Labs Lab 08/30/14 2230 08/31/14 0452  APTT  --  55*  INR 2.40* 2.45*   BMET  Recent Labs Lab 08/30/14 1638 08/31/14 0450 09/01/14 0430  NA 128* 128* 129*  K 4.1 3.7 3.5  CL 89* 90* 90*  CO2 28 26 27   BUN 71* 71* 67*  CREATININE 2.88* 2.83* 2.69*  GLUCOSE 179* 127* 144*   Electrolytes  Recent Labs Lab 08/30/14 1638 08/31/14 0450 09/01/14 0430  CALCIUM 8.4 8.4 8.4  MG 1.9  --   --    Glucose  Recent Labs Lab 08/30/14 2113 08/31/14 1146  GLUCAP 238* 114*    Imaging Dg Chest Port 1 View  08/31/2014   CLINICAL DATA:  Central line placement, history hypertension, coronary artery disease, carotid artery disease, chronic renal insufficiency stage III, type 2 diabetes, former smoker  EXAM: PORTABLE CHEST - 1 VIEW  COMPARISON:  Fell portable exam 1439 hours compared to 0440 hours  FINDINGS: New RIGHT jugular central venous catheter with tip projecting over SVC.  LEFT subclavian pacemaker/AICD leads with tips projecting over RIGHT ventricle.  Enlargement of cardiac silhouette post CABG with pulmonary vascular congestion.  Mild perihilar infiltrates slightly greater on LEFT favor pulmonary edema.  No pleural effusion or pneumothorax.  Atherosclerotic calcification aorta.  IMPRESSION: No pneumothorax following RIGHT jugular line placement.  Mild pulmonary edema/CHF.  Electronically Signed   By: Lavonia Dana M.D.   On: 08/31/2014 15:05   Dg Chest Port 1 View  08/31/2014   CLINICAL DATA:  Dyspnea  EXAM: PORTABLE CHEST - 1 VIEW  COMPARISON:  Portable chest x-ray of January 25, 2014  FINDINGS: The lungs are well-expanded. The interstitial markings are mildly increased diffusely. There is stable pleural scarring peripherally in the left mid hemithorax. The cardiac silhouette is enlarged. The central pulmonary vascularity is prominent. The permanent pacemaker defibrillator is unchanged in  appearance. The patient has undergone previous CABG.  IMPRESSION: COPD with superimposed mild CHF.  There is no pneumonia.   Electronically Signed   By: David  Martinique   On: 08/31/2014 07:26     ASSESSMENT / PLAN:  PULMONARY A: Hx of severe COPD >> stable on current regimen. P:   Continue spiriva, dulera (uses advair as outpt)  CARDIOVASCULAR Rt IJ CVL 4/07 >> Lt radial aline 4/07 >> A:  Acute on chronic Biventricular decompensated heart failure. Cardiogenic shock. Pulmonary venous HTN. H/o VT, CAD, PAF, mitral regurgitation, HLD. P:  Amiodarone, lasix, milrinone, levophed per cardiology Eliquis per cardiology Continue pravachol Hold outpt coreg, hydralazine, imdur, zaroxolyn  RENAL A:   Cardio-renal syndrome 2nd to cardiogenic shock. AKI. Hyponatremia. CKD stage III. P:   Continue to maximize cardiac outpt/hemodynamics  GASTROINTESTINAL A:   Nutrition. P:   Diet as tolerated   HEMATOLOGIC A:   Anemia of chronic disease and critical illness. P:  F/u CBC  INFECTIOUS A:   No evidence for infection. P:   Trend fever and WBC curve   ENDOCRINE A:  DM with renal complications. Hx of hypothyroidism. P:   SSI Synthroid  NEUROLOGIC A:   No acute issues. P:   Supportive care   Defer further management to cardiology team.  PCCM will sign off >> please call if additional help is needed.  Updated pt's wife at bedside.  Chesley Mires, MD [facility] Pulmonary/Critical Care 09/01/2014, 9:50 AM Pager:  [phone] After 3pm call: [phone]

## 2014-09-01 NOTE — Progress Notes (Signed)
Advanced Heart Failure Rounding Note   Subjective:   [name] is a 79 yo male with a history of HTN, CAD s/p CABG, ICM s/p Medtronic ICD, chronic systolic heart failure, DM2, Vtach, atrial flutter, PVD and GI mass. ECHO (12/22/13) EF 20-25%, Echo EF 40% (12/15) Mod-severe MR.   Admitted from HF clinic with volume overload. Diuresed with IV lasix. Yesterday had RHC as noted below with elevated PA pressures suggesting Pulmonary Venous HTN--RV failure.  Post cath he developed hypotension and required central access, levo started, and milrinone. Milrinone added for RV support. CCM consulted. Iron stores low . Had eliquis resumed 2.5 mg last evening.    He remains on Levo at 5 mcg + milrinone 0.25 mcg and he continues to diurese with lasix. Weight down 2 pounds. SBP 120s by arterial line.   Complains of fatigue. No dyspnea   RHC 08/31/2014  RA mean 14 RV 76/9/18 PA 72/17, mean 37 PCWP mean 20 with prominent V-waves Oxygen saturations: PA 61% AO 97% Cardiac Output (Fick) 5.88  Cardiac Index (Fick) 3.16 PVR 2.9 Elevated RVEDP and RA pressure, CVP/PCWP = 0.7 suggests RV > LV failure.   Creatinine 2.88> 2.83 > 2.69  Hgb 8.5>7.7    Objective:   Weight Range:  Vital Signs:   Temp:  [97.5 F (36.4 C)-98.6 F (37 C)] 98.6 F (37 C) (04/08 0400) Pulse Rate:  [50-58] 57 (04/08 0700) Resp:  [8-18] 11 (04/08 0700) BP: (55-127)/(18-66) 127/35 mmHg (04/07 1500) SpO2:  [86 %-100 %] 98 % (04/08 0700) Arterial Line BP: (106-165)/(33-56) 106/34 mmHg (04/08 0700) Weight:  [160 lb 7.9 oz (72.8 kg)-162 lb 0.6 oz (73.5 kg)] 160 lb 7.9 oz (72.8 kg) (04/08 0403)    Weight change: Filed Weights   08/31/14 0252 08/31/14 1800 09/01/14 0403  Weight: 159 lb 13.3 oz (72.5 kg) 162 lb 0.6 oz (73.5 kg) 160 lb 7.9 oz (72.8 kg)    Intake/Output:   Intake/Output Summary (Last 24 hours) at 09/01/14 0258 Last data filed at 09/01/14 0700  Gross per 24 hour  Intake 742.58 ml  Output   2275  ml  Net -1532.42 ml    PHYSICAL EXAM General: Chronically ill appearing. No resp difficulty HEENT: normal Neck: supple. JVP 7 Carotids 2+ bilaterally; no bruits. No lymphadenopathy or thryomegaly appreciated. RIJ  Cor: PMI normal. Regular rate & rhythm. No rubs, gallops or murmurs. Lungs: clear Abdomen: soft, nontender, nondistended. No hepatosplenomegaly. No bruits or masses. Good bowel sounds. Extremities: no cyanosis, clubbing, rash, R and LLE 2 edema SCDS on RLE and LLE  Neuro: alert & orientedx3, cranial nerves grossly intact. Moves all 4 extremities w/o difficulty. Affect pleasant.  Telemetry: Sinus Brady 50s   Labs: Basic Metabolic Panel:  Recent Labs Lab 08/30/14 1638 08/31/14 0450 09/01/14 0430  NA 128* 128* 129*  K 4.1 3.7 3.5  CL 89* 90* 90*  CO2 28 26 27   GLUCOSE 179* 127* 144*  BUN 71* 71* 67*  CREATININE 2.88* 2.83* 2.69*  CALCIUM 8.4 8.4 8.4  MG 1.9  --   --     Liver Function Tests: No results for input(s): AST, ALT, ALKPHOS, BILITOT, PROT, ALBUMIN in the last 168 hours. No results for input(s): LIPASE, AMYLASE in the last 168 hours. No results for input(s): AMMONIA in the last 168 hours.  CBC:  Recent Labs Lab 08/30/14 1638 08/31/14 1216 09/01/14 0430  WBC 9.8 9.8 9.0  NEUTROABS  --  6.9  --   HGB 8.6* 8.5* 7.7*  HCT 26.1* 25.8* 22.8*  MCV 84.2 83.5 82.6  PLT 134* 159 150    Cardiac Enzymes: No results for input(s): CKTOTAL, CKMB, CKMBINDEX, TROPONINI in the last 168 hours.  BNP: BNP (last 3 results)  Recent Labs  06/07/14 1421 08/30/14 1639  BNP 528.4* 800.7*    ProBNP (last 3 results)  Recent Labs  01/24/14 1909 02/09/14 0945  PROBNP 6721.0* 2555.0*      Other results:  Imaging: Dg Chest Port 1 View  08/31/2014   CLINICAL DATA:  Central line placement, history hypertension, coronary artery disease, carotid artery disease, chronic renal insufficiency stage III, type 2 diabetes, former smoker  EXAM: PORTABLE CHEST - 1  VIEW  COMPARISON:  Fell portable exam 1439 hours compared to 0440 hours  FINDINGS: New RIGHT jugular central venous catheter with tip projecting over SVC.  LEFT subclavian pacemaker/AICD leads with tips projecting over RIGHT ventricle.  Enlargement of cardiac silhouette post CABG with pulmonary vascular congestion.  Mild perihilar infiltrates slightly greater on LEFT favor pulmonary edema.  No pleural effusion or pneumothorax.  Atherosclerotic calcification aorta.  IMPRESSION: No pneumothorax following RIGHT jugular line placement.  Mild pulmonary edema/CHF.   Electronically Signed   By: Lavonia Dana M.D.   On: 08/31/2014 15:05   Dg Chest Port 1 View  08/31/2014   CLINICAL DATA:  Dyspnea  EXAM: PORTABLE CHEST - 1 VIEW  COMPARISON:  Portable chest x-ray of January 25, 2014  FINDINGS: The lungs are well-expanded. The interstitial markings are mildly increased diffusely. There is stable pleural scarring peripherally in the left mid hemithorax. The cardiac silhouette is enlarged. The central pulmonary vascularity is prominent. The permanent pacemaker defibrillator is unchanged in appearance. The patient has undergone previous CABG.  IMPRESSION: COPD with superimposed mild CHF.  There is no pneumonia.   Electronically Signed   By: David  Martinique   On: 08/31/2014 07:26     Medications:     Scheduled Medications: . amiodarone  200 mg Oral Daily  . apixaban  2.5 mg Oral BID  . Chlorhexidine Gluconate Cloth  6 each Topical q morning - 10a  . furosemide  40 mg Intravenous BID  . guaiFENesin  1,200 mg Oral BID  . insulin aspart  0-15 Units Subcutaneous TID WC  . insulin aspart  0-5 Units Subcutaneous QHS  . insulin aspart  7 Units Subcutaneous Q breakfast   And  . insulin aspart  8 Units Subcutaneous Q lunch   And  . insulin aspart  9 Units Subcutaneous Q supper  . insulin glargine  10 Units Subcutaneous QHS  . levothyroxine  75 mcg Oral QAC breakfast  . mometasone-formoterol  2 puff Inhalation BID  .  mupirocin ointment  1 application Nasal BID  . potassium chloride  20 mEq Oral Daily  . pravastatin  80 mg Oral q1800  . sodium chloride  3 mL Intravenous Q12H  . sodium chloride  3 mL Intravenous Q12H  . sodium chloride  3 mL Intravenous Q12H  . tiotropium  18 mcg Inhalation QPM    Infusions: . sodium chloride 10 mL/hr at 08/31/14 1200  . milrinone 0.25 mcg/kg/min (08/31/14 1947)  . norepinephrine (LEVOPHED) Adult infusion 5 mcg/min (08/31/14 1947)    PRN Medications: sodium chloride, sodium chloride, sodium chloride, acetaminophen, ondansetron (ZOFRAN) IV, sodium chloride, sodium chloride, sodium chloride   Assessment/Plan   Mr [name] is a 79 year old with ischemic cardiomyopathy and acute/chronic systolic heart failure admitted with volume overload and suspected low output.  1. A/C Systolic Heart Failure: Ischemic cardiomyopathy. Diuresing with IV lasix. Urine output marginal after 80 mg IV lasix.  -Post Cath on Milrinone for RV support. CO-OX 75%. Renal function trending down. Volume status improved. CVP down to 6. Stop IV lasix and start lasix 40 mg daily.  No BB with  Bradycardia. No Ace/Spiro with CKD.  -- Repeat ECHO --EF 40-45% Mod-Sev MR, RA mod-severely dilated, RV mod dilated. Grade II DD.   2. PAF: on amiodarone 200 mg daily. Hgb down today. Hold Eliquis for now with drop in hemoglobin.  3. Mitral Regurgitation: repeat ECHO 4. AKI on CKD: Suspect cardiorenal.  5. DM- Hgb A1C pending. On home regimen + sliding scale 6. CAD S/P CABG 7. COPD 8. H/O VT:   9. Hypothyroidism 10. Anemia: FOBT, check iron studies. Iron store low. Give Fereheme x1. Hgb down today to 7.7 with no s/s of active bleeding. Hold eliquis for now. Continue SCDs .  Need to consider Gi consult. Follow CBC.   Length of Stay: 1  CLEGG,AMY NP-C  09/01/2014, 7:02 AM  Advanced Heart Failure Team Pager [phone] (M-F; [place])  Please contact [facility] for night-coverage after hours (4p -7a )  and weekends on [email or web address]  Patient seen and examined with Darrick Grinder, NP. We discussed all aspects of the encounter. I agree with the assessment and plan as stated above.   I have reviewed RHC in depth with him and his wife. I think his current decompensation is multifactorial and not strictly HF.  Cardiac output is fairly high and he mostly has high output pHTN which is frequently seen in patients with cirrhosis but may also be related partly to his anemia. I have reviewed recent ab u/s and CT and no evidence of cirrhosis by imaging. He also has significant renal disease which is limiting our ability to manage his volume. Finally he also has RV dysfunction which is contributing to the problem.   At this point would continue attempts at IV diuresis. Wean levophed to keep SBP > 110. Can try renal-dose dopamine if needed to stimulate diuresis. Suspect his degree of renal failure will continue to complicate volume management.   Recheck CBC (and Type & Screen) this afternoon. Transfuse if hgb < 8.0. Restart apixaban as soon as possible. Agree with feraheme.  No b-blocker or ACE yet due to decompensation and  renal failure  Benay Spice 11:37 AM

## 2014-09-02 LAB — CARBOXYHEMOGLOBIN
Carboxyhemoglobin: 2.5 % — ABNORMAL HIGH (ref 0.5–1.5)
Methemoglobin: 1.3 % (ref 0.0–1.5)
O2 SAT: 74.2 %
Total hemoglobin: 11.4 g/dL — ABNORMAL LOW (ref 13.5–18.0)

## 2014-09-02 LAB — CBC
HEMATOCRIT: 27.8 % — AB (ref 39.0–52.0)
HEMOGLOBIN: 9.6 g/dL — AB (ref 13.0–17.0)
MCH: 28.8 pg (ref 26.0–34.0)
MCHC: 34.5 g/dL (ref 30.0–36.0)
MCV: 83.5 fL (ref 78.0–100.0)
PLATELETS: 153 10*3/uL (ref 150–400)
RBC: 3.33 MIL/uL — ABNORMAL LOW (ref 4.22–5.81)
RDW: 16.2 % — ABNORMAL HIGH (ref 11.5–15.5)
WBC: 10.6 10*3/uL — ABNORMAL HIGH (ref 4.0–10.5)

## 2014-09-02 LAB — BASIC METABOLIC PANEL
Anion gap: 14 (ref 5–15)
BUN: 61 mg/dL — AB (ref 6–23)
CHLORIDE: 88 mmol/L — AB (ref 96–112)
CO2: 26 mmol/L (ref 19–32)
Calcium: 8.6 mg/dL (ref 8.4–10.5)
Creatinine, Ser: 2.52 mg/dL — ABNORMAL HIGH (ref 0.50–1.35)
GFR calc Af Amer: 27 mL/min — ABNORMAL LOW (ref >90)
GFR, EST NON AFRICAN AMERICAN: 23 mL/min — AB (ref >90)
Glucose, Bld: 145 mg/dL — ABNORMAL HIGH (ref 70–99)
Potassium: 3.5 mmol/L (ref 3.5–5.1)
SODIUM: 128 mmol/L — AB (ref 135–145)

## 2014-09-02 LAB — TYPE AND SCREEN
ABO/RH(D): O POS
ANTIBODY SCREEN: NEGATIVE
Unit division: 0
Unit division: 0

## 2014-09-02 LAB — T3: T3, Total: 71 ng/dL (ref 71–180)

## 2014-09-02 LAB — T4: T4 TOTAL: 8.7 ug/dL (ref 4.5–12.0)

## 2014-09-02 MED ORDER — TORSEMIDE 20 MG PO TABS
40.0000 mg | ORAL_TABLET | Freq: Every day | ORAL | Status: DC
  Administered 2014-09-03 – 2014-09-05 (×3): 40 mg via ORAL
  Filled 2014-09-02 (×4): qty 2

## 2014-09-02 MED ORDER — PANTOPRAZOLE SODIUM 40 MG PO TBEC
40.0000 mg | DELAYED_RELEASE_TABLET | Freq: Two times a day (BID) | ORAL | Status: DC
  Administered 2014-09-02 – 2014-09-05 (×6): 40 mg via ORAL
  Filled 2014-09-02 (×5): qty 1

## 2014-09-02 NOTE — Progress Notes (Signed)
Patient ID: DAWOOD SPITLER, male   DOB: May 11, 1936, 79 y.o.   MRN: [id number] Advanced Heart Failure Rounding Note   Subjective:   Mr. Cocuzza is a 79 yo male with a history of HTN, CAD s/p CABG, ICM s/p Medtronic ICD, chronic systolic heart failure, DM2, Vtach, atrial flutter, PVD and GI mass. ECHO (12/22/13) EF 20-25%, Echo EF 40% (12/15) Mod-severe MR.   Admitted from HF clinic with volume overload. Diuresed with IV lasix. At admission he had RHC as noted below with elevated PA pressures suggesting pulmonary venous HTN--RV failure.  Post-cath he developed hypotension and required central access, levo started, and milrinone. Milrinone added for RV support. CCM consulted. Iron stores low.   He remains on Levo at 5 mcg + milrinone 0.25 mcg and he continues to diurese with lasix. Weight down 4 pounds. SBP 130s by arterial line.  CVP 6 today.   Progressive anemia, received 2 units PRBCs yesterday and feels much better.  2 black stools overnight, not sent for hemoccult.   RHC 08/31/2014  RA mean 14 RV 76/9/18 PA 72/17, mean 37 PCWP mean 20 with prominent V-waves Oxygen saturations: PA 61% AO 97% Cardiac Output (Fick) 5.88  Cardiac Index (Fick) 3.16 PVR 2.9 Elevated RVEDP and RA pressure, CVP/PCWP = 0.7 suggests RV > LV failure.   Creatinine 2.88> 2.83 > 2.69 > 2.5 Hgb 8.5>7.7 > 7.3 transfusion > 9.6   Objective:   Weight Range:  Vital Signs:   Temp:  [97.5 F (36.4 C)-98.7 F (37.1 C)] 97.5 F (36.4 C) (04/09 0400) Pulse Rate:  [55-103] 73 (04/09 0700) Resp:  [7-22] 16 (04/09 0700) SpO2:  [87 %-100 %] 98 % (04/09 0825) Arterial Line BP: (96-156)/(27-62) 132/40 mmHg (04/09 0700) Weight:  [156 lb 12 oz (71.1 kg)] 156 lb 12 oz (71.1 kg) (04/09 0443) Last BM Date: 09/01/14  Weight change: Filed Weights   08/31/14 1800 09/01/14 0403 09/02/14 0443  Weight: 162 lb 0.6 oz (73.5 kg) 160 lb 7.9 oz (72.8 kg) 156 lb 12 oz (71.1 kg)    Intake/Output:   Intake/Output  Summary (Last 24 hours) at 09/02/14 0845 Last data filed at 09/02/14 0700  Gross per 24 hour  Intake 1222.84 ml  Output   3550 ml  Net -2327.16 ml    PHYSICAL EXAM General: Chronically ill appearing. No resp difficulty HEENT: normal Neck: supple. JVP 7 Carotids 2+ bilaterally; no bruits. No lymphadenopathy or thryomegaly appreciated. RIJ  Cor: PMI normal. Regular rate & rhythm. No rubs, gallops or murmurs. Lungs: Occasional wheezes. Abdomen: soft, nontender, nondistended. No hepatosplenomegaly. No bruits or masses. Good bowel sounds. Extremities: no cyanosis, clubbing, rash,1+ ankle edema.  SCDS on RLE and LLE  Neuro: alert & orientedx3, cranial nerves grossly intact. Moves all 4 extremities w/o difficulty. Affect pleasant.  Telemetry: NSR 60s  Labs: Basic Metabolic Panel:  Recent Labs Lab 08/30/14 1638 08/31/14 0450 09/01/14 0430 09/02/14 0502  NA 128* 128* 129* 128*  K 4.1 3.7 3.5 3.5  CL 89* 90* 90* 88*  CO2 28 26 27 26   GLUCOSE 179* 127* 144* 145*  BUN 71* 71* 67* 61*  CREATININE 2.88* 2.83* 2.69* 2.52*  CALCIUM 8.4 8.4 8.4 8.6  MG 1.9  --   --   --     Liver Function Tests: No results for input(s): AST, ALT, ALKPHOS, BILITOT, PROT, ALBUMIN in the last 168 hours. No results for input(s): LIPASE, AMYLASE in the last 168 hours. No results for input(s): AMMONIA in the last  168 hours.  CBC:  Recent Labs Lab 08/30/14 1638 08/31/14 1216 09/01/14 0430 09/01/14 1330 09/02/14 0502  WBC 9.8 9.8 9.0 9.4 10.6*  NEUTROABS  --  6.9  --   --   --   HGB 8.6* 8.5* 7.7* 7.3* 9.6*  HCT 26.1* 25.8* 22.8* 21.4* 27.8*  MCV 84.2 83.5 82.6 82.6 83.5  PLT 134* 159 150 121* 153    Cardiac Enzymes: No results for input(s): CKTOTAL, CKMB, CKMBINDEX, TROPONINI in the last 168 hours.  BNP: BNP (last 3 results)  Recent Labs  06/07/14 1421 08/30/14 1639  BNP 528.4* 800.7*    ProBNP (last 3 results)  Recent Labs  01/24/14 1909 02/09/14 0945  PROBNP 6721.0* 2555.0*       Other results:  Imaging: Dg Chest Port 1 View  08/31/2014   CLINICAL DATA:  Central line placement, history hypertension, coronary artery disease, carotid artery disease, chronic renal insufficiency stage III, type 2 diabetes, former smoker  EXAM: PORTABLE CHEST - 1 VIEW  COMPARISON:  Fell portable exam 1439 hours compared to 0440 hours  FINDINGS: New RIGHT jugular central venous catheter with tip projecting over SVC.  LEFT subclavian pacemaker/AICD leads with tips projecting over RIGHT ventricle.  Enlargement of cardiac silhouette post CABG with pulmonary vascular congestion.  Mild perihilar infiltrates slightly greater on LEFT favor pulmonary edema.  No pleural effusion or pneumothorax.  Atherosclerotic calcification aorta.  IMPRESSION: No pneumothorax following RIGHT jugular line placement.  Mild pulmonary edema/CHF.   Electronically Signed   By: Lavonia Dana M.D.   On: 08/31/2014 15:05     Medications:     Scheduled Medications: . amiodarone  200 mg Oral Daily  . Chlorhexidine Gluconate Cloth  6 each Topical q morning - 10a  . guaiFENesin  1,200 mg Oral BID  . insulin aspart  0-15 Units Subcutaneous TID WC  . insulin aspart  0-5 Units Subcutaneous QHS  . insulin aspart  7 Units Subcutaneous Q breakfast   And  . insulin aspart  8 Units Subcutaneous Q lunch   And  . insulin aspart  9 Units Subcutaneous Q supper  . insulin glargine  10 Units Subcutaneous QHS  . levothyroxine  75 mcg Oral QAC breakfast  . mometasone-formoterol  2 puff Inhalation BID  . mupirocin ointment  1 application Nasal BID  . pantoprazole  40 mg Oral BID  . potassium chloride  40 mEq Oral Daily  . pravastatin  80 mg Oral q1800  . tiotropium  18 mcg Inhalation QPM  . [START ON 09/03/2014] torsemide  40 mg Oral Daily    Infusions: . sodium chloride 5 mL/hr at 09/01/14 1800  . milrinone 0.25 mcg/kg/min (09/01/14 0800)  . norepinephrine (LEVOPHED) Adult infusion 5 mcg/min (09/01/14 0941)    PRN  Medications: acetaminophen, ondansetron (ZOFRAN) IV   Assessment/Plan   Mr Garrel Ridgel is a 79 year old with ischemic cardiomyopathy and acute/chronic systolic heart failure admitted with volume overload and suspected low output.  1. A/C Systolic Heart Failure: Ischemic cardiomyopathy.  EF 40-45% on echo with elevated PA systolic pressure and moderately dilated RV.  On RHC, good cardiac output but looked like pulmonary venous HTN with more prominent RV failure.  Required norepinephrine for BP support and on milrinone for RV support.  Anemia likely contributing.  He diuresed well yesterday, CVP now 6.  Symptomatically improved with transfusions yesterday. - No beta blocker with bradycardia. No ACEI with CKD.  - Will try to wean off norepinephrine today.  -  Got IV Lasix this am, stop now and start torsemide 40 daily tomorrow.  2. PAF: on amiodarone 200 mg daily, in NSR.  Eliquis on hold with anemia, ?GI bleeding.  3. Mitral Regurgitation: Moderate to severe by echo.  4. AKI on CKD: Suspect cardiorenal. Some improvement with milrinone.  5. DM:  On home regimen + sliding scale 6. CAD S/P CABG: No chest pain.  7. COPD: Some wheezing on exam today.  8. H/O VT: None here.   9. Hypothyroidism 10. Anemia: Fe-deficient, got IV iron.  Hgb to 7.3 yesterday, transfused 2 units.  2 dark stools overnight, unfortunately did not get hemoccult.  Concern for upper GI bleeding. Complicated by need for anticoagulation.  - Start Protonix 40 bid. - Guaiac stools. - Consult GI today for evaluation.  - Hold Eliquis pending GI evaluation.   Length of Stay: 2  [name] NP-C  09/02/2014, 8:45 AM

## 2014-09-02 NOTE — Consult Note (Signed)
Reason for Consult: Melena and progressive anemia Referring Physician: Cardiology  [name] HPI: This is a 79 year old male with a PMH of CAD s/p CABG, ICM s/p ICD, CHF,Vtach, aflutter, PVD, and HTN admitted with fluid overload.  In the recent past he was evaluated by Dr. Earlean Shawl for a possible colonic mass/thickening.  A colonoscopy was recommended, but the patient declined.  There was thought that it was from a chronic diverticulitis.  During this hospitalization he was noted to have two melenic bowel movements overnight.  His HGB dropped from 8.6 g/dL down to 7.3 g/dL.  At home he was on Eliquis since September, but it was held for the catherization.  The medication was restarted only to be held again with the anemia and melena.  Overnight he received two units of PRBC and he feels better.  Past Medical History  Diagnosis Date  . VENTRICULAR TACHYCARDIA   . PERIPHERAL VASCULAR DISEASE   . HYPERTENSION     Hypertensive retinopathy-grade II OU  . HYPERCHOLESTEROLEMIA   . CORONARY ARTERY DISEASE     Hx of CABG  . Chronic systolic heart failure     Ischemic cardiomyopathy (01/2014 EF 40-45% with hypokinesis + small area of akinesis  . SEBORRHEIC DERMATITIS   . Chronic renal insufficiency, stage III (moderate)     CrCl 40s-50s  . DIVERTICULOSIS OF COLON   . COPD, severe   . COLONIC POLYPS   . ANXIETY   . ICD (implantable cardiac defibrillator) in place   . Type II diabetes mellitus     +background diabetic retinopathy OU  . Chronic lower back pain   . DJD (degenerative joint disease)     "hands" (05/24/2012)  . Osteoarthritis of finger   . History of atrial flutter   . Thrombus 12/2013    on ICD lead--started anticoag and his cardioversion for atrial flutter was postponed.  . Colon stricture 2015    with features worrisome for mass, also with recent rising CEA level (possible colon cancer)-GI MD= Dr. Franki Cabot recent eval/follow up with Dr. Earlean Shawl was summer 2014, at which  time he recommended colonoscopy but pt declined.  . Macular degeneration, age related, nonexudative     OU    Past Surgical History  Procedure Laterality Date  . Coronary artery bypass graft  1990's    CABG X4  . Tonsillectomy  ?1942  . Cardiac defibrillator placement  12/2004    single chamber defibrillator- Medtronic Maxima 8/06 DrKlein [Other][  . Tee without cardioversion N/A 12/22/2013    Procedure: TRANSESOPHAGEAL ECHOCARDIOGRAM (TEE);  Surgeon: Thayer Headings, MD;  Location: [place];  Service: Cardiovascular;  Laterality: N/A;  . Cardioversion N/A 12/22/2013    Procedure: CARDIOVERSION;  Surgeon: Thayer Headings, MD;  Location: [facility] ENDOSCOPY;  Service: Cardiovascular;  Laterality: N/A;  . Cardiovascular stress test  01/12/13    myocard perf imaging: previous infarct with a moderately reduced EF as was known previously.  No new findings.   . Gastric emptying scan  02/02/14    Normal  . Transthoracic echocardiogram  01/25/14; 04/2014    EF 40-45%, diffuse hypokinesis, infero-basilar myocardial akinesis, PA pressure slightly high, valves fine.04/2014-Echo today EF ~40% inferior AK moderate to severe MR  . Abdominal ultrasound  01/2014    GB sludge, o/w normal  . Ct virtual colonoscopy diagnostic  11/2012    Asymmetric thickening of the rectosigmoid colon worrisome for  . Cardiovascular stress test      Lexiscan: There is  a medium sized fixed inferior wall defect of moderate severity involving the apical inferior and mid inferior and basal inferoseptal segments, consistent with old inferior MI. No significant reversible ischemia.  EF 34%, inferior wall hypokinesis--intermediate risk scan (ok to do planned procedure)  . Implantable cardioverter defibrillator revision N/A 05/25/2012    Procedure: IMPLANTABLE CARDIOVERTER DEFIBRILLATOR REVISION;  Surgeon: Evans Lance, MD;  Location: [facility] CATH LAB;  Service: Cardiovascular;  Laterality: N/A;  . Right heart catheterization N/A 08/31/2014     Procedure: RIGHT HEART CATH;  Surgeon: Larey Dresser, MD;  Location: [facility] CATH LAB;  Service: Cardiovascular;  Laterality: N/A;    Family History  Problem Relation Age of Onset  . Heart attack Father 44  . Heart attack Brother     Vague history    Social History:  reports that he quit smoking about 14 years ago. His smoking use included Cigarettes. He has a 100 pack-year smoking history. He has never used smokeless tobacco. He reports that he drinks alcohol. He reports that he does not use illicit drugs.  Allergies:  Allergies  Allergen Reactions  . Niacin Itching    Niaspan     Medications:  Scheduled: . amiodarone  200 mg Oral Daily  . Chlorhexidine Gluconate Cloth  6 each Topical q morning - 10a  . guaiFENesin  1,200 mg Oral BID  . insulin aspart  0-15 Units Subcutaneous TID WC  . insulin aspart  0-5 Units Subcutaneous QHS  . insulin aspart  7 Units Subcutaneous Q breakfast   And  . insulin aspart  8 Units Subcutaneous Q lunch   And  . insulin aspart  9 Units Subcutaneous Q supper  . insulin glargine  10 Units Subcutaneous QHS  . levothyroxine  75 mcg Oral QAC breakfast  . mometasone-formoterol  2 puff Inhalation BID  . mupirocin ointment  1 application Nasal BID  . pantoprazole  40 mg Oral BID  . potassium chloride  40 mEq Oral Daily  . pravastatin  80 mg Oral q1800  . tiotropium  18 mcg Inhalation QPM  . [START ON 09/03/2014] torsemide  40 mg Oral Daily   Continuous: . sodium chloride 5 mL/hr at 09/01/14 1800  . milrinone 0.25 mcg/kg/min (09/01/14 0800)  . norepinephrine (LEVOPHED) Adult infusion 5 mcg/min (09/01/14 0941)    Results for orders placed or performed during the hospital encounter of 08/31/14 (from the past 24 hour(s))  Glucose, capillary     Status: Abnormal   Collection Time: 09/01/14 12:16 PM  Result Value Ref Range   Glucose-Capillary 118 (H) 70 - 99 mg/dL   Comment 1 Capillary Specimen   Type and screen for Red Blood Exchange     Status: None    Collection Time: 09/01/14  1:15 PM  Result Value Ref Range   ABO/RH(D) O POS    Antibody Screen NEG    Sample Expiration 09/04/2014    Unit Number [id number]    Blood Component Type RED CELLS,LR    Unit division 00    Status of Unit ISSUED,FINAL    Transfusion Status OK TO TRANSFUSE    Crossmatch Result Compatible    Unit Number [id number]    Blood Component Type RED CELLS,LR    Unit division 00    Status of Unit ISSUED,FINAL    Transfusion Status OK TO TRANSFUSE    Crossmatch Result Compatible   ABO/Rh     Status: None   Collection Time: 09/01/14  1:15 PM  Result Value  Ref Range   ABO/RH(D) O POS   CBC     Status: Abnormal   Collection Time: 09/01/14  1:30 PM  Result Value Ref Range   WBC 9.4 4.0 - 10.5 K/uL   RBC 2.59 (L) 4.22 - 5.81 MIL/uL   Hemoglobin 7.3 (L) 13.0 - 17.0 g/dL   HCT 21.4 (L) 39.0 - 52.0 %   MCV 82.6 78.0 - 100.0 fL   MCH 28.2 26.0 - 34.0 pg   MCHC 34.1 30.0 - 36.0 g/dL   RDW 17.4 (H) 11.5 - 15.5 %   Platelets 121 (L) 150 - 400 K/uL  Prepare RBC     Status: None   Collection Time: 09/01/14  3:04 PM  Result Value Ref Range   Order Confirmation ORDER PROCESSED BY BLOOD BANK   Basic metabolic panel     Status: Abnormal   Collection Time: 09/02/14  5:02 AM  Result Value Ref Range   Sodium 128 (L) 135 - 145 mmol/L   Potassium 3.5 3.5 - 5.1 mmol/L   Chloride 88 (L) 96 - 112 mmol/L   CO2 26 19 - 32 mmol/L   Glucose, Bld 145 (H) 70 - 99 mg/dL   BUN 61 (H) 6 - 23 mg/dL   Creatinine, Ser 2.52 (H) 0.50 - 1.35 mg/dL   Calcium 8.6 8.4 - 10.5 mg/dL   GFR calc non Af Amer 23 (L) >90 mL/min   GFR calc Af Amer 27 (L) >90 mL/min   Anion gap 14 5 - 15  CBC     Status: Abnormal   Collection Time: 09/02/14  5:02 AM  Result Value Ref Range   WBC 10.6 (H) 4.0 - 10.5 K/uL   RBC 3.33 (L) 4.22 - 5.81 MIL/uL   Hemoglobin 9.6 (L) 13.0 - 17.0 g/dL   HCT 27.8 (L) 39.0 - 52.0 %   MCV 83.5 78.0 - 100.0 fL   MCH 28.8 26.0 - 34.0 pg   MCHC 34.5 30.0 - 36.0 g/dL    RDW 16.2 (H) 11.5 - 15.5 %   Platelets 153 150 - 400 K/uL     Dg Chest Port 1 View  08/31/2014   CLINICAL DATA:  Central line placement, history hypertension, coronary artery disease, carotid artery disease, chronic renal insufficiency stage III, type 2 diabetes, former smoker  EXAM: PORTABLE CHEST - 1 VIEW  COMPARISON:  [facility]ell portable exam 1439 hours compared to 0440 hours  FINDINGS: New RIGHT jugular central venous catheter with tip projecting over SVC.  LEFT subclavian pacemaker/AICD leads with tips projecting over RIGHT ventricle.  Enlargement of cardiac silhouette post CABG with pulmonary vascular congestion.  Mild perihilar infiltrates slightly greater on LEFT favor pulmonary edema.  No pleural effusion or pneumothorax.  Atherosclerotic calcification aorta.  IMPRESSION: No pneumothorax following RIGHT jugular line placement.  Mild pulmonary edema/CHF.   Electronically Signed   By: Lavonia Dana M.D.   On: 08/31/2014 15:05    ROS:  As stated above in the HPI otherwise negative.  Blood pressure 127/35, pulse 73, temperature 97.5 F (36.4 C), temperature source Oral, resp. rate 16, height 5\' 7"  (1.702 m), weight 71.1 kg (156 lb 12 oz), SpO2 98 %.    PE: Gen: NAD, Alert and Oriented HEENT:  [place]/AT, EOMI Neck: Supple, no LAD Lungs: CTA Bilaterally CV: RRR without M/G/R ABM: Soft, NTND, +BS Ext: No C/C/E  Assessment/Plan: 1) Melena. 2) Anemia.   The presumption is that he has an upper GI bleed.  Clinically he is stable  Plan: 1) EGD tomorrow AM. 2) Follow HGB. 3) Transfuse if necessary.  Jakaden Ouzts D 09/02/2014, 10:02 AM

## 2014-09-03 ENCOUNTER — Encounter (HOSPITAL_COMMUNITY)
Admission: EM | Disposition: A | Discharge status: Home / Self Care | Payer: Medicare Other | Source: Ambulatory Visit | Attending: Cardiology

## 2014-09-03 DIAGNOSIS — K922 Gastrointestinal hemorrhage, unspecified: Secondary | ICD-10-CM

## 2014-09-03 HISTORY — PX: ESOPHAGOGASTRODUODENOSCOPY: SHX5428

## 2014-09-03 LAB — BASIC METABOLIC PANEL
Anion gap: 14 (ref 5–15)
BUN: 49 mg/dL — AB (ref 6–23)
CHLORIDE: 87 mmol/L — AB (ref 96–112)
CO2: 27 mmol/L (ref 19–32)
Calcium: 8.2 mg/dL — ABNORMAL LOW (ref 8.4–10.5)
Creatinine, Ser: 2.33 mg/dL — ABNORMAL HIGH (ref 0.50–1.35)
GFR calc Af Amer: 29 mL/min — ABNORMAL LOW (ref >90)
GFR calc non Af Amer: 25 mL/min — ABNORMAL LOW (ref >90)
GLUCOSE: 151 mg/dL — AB (ref 70–99)
POTASSIUM: 3.2 mmol/L — AB (ref 3.5–5.1)
Sodium: 128 mmol/L — ABNORMAL LOW (ref 135–145)

## 2014-09-03 LAB — CBC
HCT: 27.2 % — ABNORMAL LOW (ref 39.0–52.0)
HEMOGLOBIN: 9.4 g/dL — AB (ref 13.0–17.0)
MCH: 28.7 pg (ref 26.0–34.0)
MCHC: 34.6 g/dL (ref 30.0–36.0)
MCV: 82.9 fL (ref 78.0–100.0)
PLATELETS: 105 10*3/uL — AB (ref 150–400)
RBC: 3.28 MIL/uL — AB (ref 4.22–5.81)
RDW: 16.6 % — AB (ref 11.5–15.5)
WBC: 12.1 10*3/uL — AB (ref 4.0–10.5)

## 2014-09-03 LAB — CARBOXYHEMOGLOBIN
Carboxyhemoglobin: 2.4 % — ABNORMAL HIGH (ref 0.5–1.5)
METHEMOGLOBIN: 1.2 % (ref 0.0–1.5)
O2 Saturation: 71 %
Total hemoglobin: 9.7 g/dL — ABNORMAL LOW (ref 13.5–18.0)

## 2014-09-03 LAB — GLUCOSE, CAPILLARY
GLUCOSE-CAPILLARY: 111 mg/dL — AB (ref 70–99)
Glucose-Capillary: 112 mg/dL — ABNORMAL HIGH (ref 70–99)

## 2014-09-03 SURGERY — EGD (ESOPHAGOGASTRODUODENOSCOPY)
Anesthesia: Moderate Sedation

## 2014-09-03 MED ORDER — POTASSIUM CHLORIDE CRYS ER 20 MEQ PO TBCR
40.0000 meq | EXTENDED_RELEASE_TABLET | Freq: Once | ORAL | Status: AC
  Administered 2014-09-03: 40 meq via ORAL

## 2014-09-03 MED ORDER — FENTANYL CITRATE 0.05 MG/ML IJ SOLN
INTRAMUSCULAR | Status: AC
  Filled 2014-09-03: qty 2

## 2014-09-03 MED ORDER — APIXABAN 2.5 MG PO TABS
2.5000 mg | ORAL_TABLET | Freq: Two times a day (BID) | ORAL | Status: DC
  Administered 2014-09-03 – 2014-09-05 (×4): 2.5 mg via ORAL
  Filled 2014-09-03 (×5): qty 1

## 2014-09-03 MED ORDER — ISOSORBIDE MONONITRATE ER 30 MG PO TB24
30.0000 mg | ORAL_TABLET | Freq: Every day | ORAL | Status: DC
  Administered 2014-09-03 – 2014-09-05 (×3): 30 mg via ORAL
  Filled 2014-09-03 (×3): qty 1

## 2014-09-03 MED ORDER — HYDRALAZINE HCL 10 MG PO TABS
10.0000 mg | ORAL_TABLET | Freq: Three times a day (TID) | ORAL | Status: DC
  Administered 2014-09-03 – 2014-09-04 (×5): 10 mg via ORAL
  Filled 2014-09-03 (×9): qty 1

## 2014-09-03 MED ORDER — SODIUM CHLORIDE 0.9 % IV SOLN: INTRAVENOUS | Status: DC

## 2014-09-03 MED ORDER — DIPHENHYDRAMINE HCL 50 MG/ML IJ SOLN
INTRAMUSCULAR | Status: AC
  Filled 2014-09-03: qty 1

## 2014-09-03 MED ORDER — MIDAZOLAM HCL 5 MG/ML IJ SOLN
INTRAMUSCULAR | Status: AC
  Filled 2014-09-03: qty 2

## 2014-09-03 MED ORDER — MIDAZOLAM HCL 10 MG/2ML IJ SOLN
INTRAMUSCULAR | Status: DC | PRN
  Administered 2014-09-03: 2 mg via INTRAVENOUS

## 2014-09-03 MED ORDER — FLUCONAZOLE 100 MG PO TABS
100.0000 mg | ORAL_TABLET | Freq: Every day | ORAL | Status: DC
  Administered 2014-09-03 – 2014-09-05 (×3): 100 mg via ORAL
  Filled 2014-09-03 (×3): qty 1

## 2014-09-03 MED ORDER — FENTANYL CITRATE 0.05 MG/ML IJ SOLN
INTRAMUSCULAR | Status: DC | PRN
  Administered 2014-09-03: 25 ug via INTRAVENOUS

## 2014-09-03 NOTE — Progress Notes (Signed)
ANTICOAGULATION CONSULT NOTE - Follow Up Consult  Pharmacy Consult for apixiban Indication: aflutter  Allergies  Allergen Reactions  . Niacin Itching    Niaspan     Patient Measurements: Height: 5\' 7"  (170.2 cm) Weight: 154 lb 6.4 oz (70.035 kg) IBW/kg (Calculated) : 66.1  Vital Signs: Temp: 98.3 F (36.8 C) (04/10 0822) Temp Source: Oral (04/10 0822) BP: 141/57 mmHg (04/10 0910) Pulse Rate: 69 (04/10 0852)  Labs:  Recent Labs  09/01/14 0430 09/01/14 1330 09/02/14 0502 09/03/14 0600  HGB 7.7* 7.3* 9.6* 9.4*  HCT 22.8* 21.4* 27.8* 27.2*  PLT 150 121* 153 105*  CREATININE 2.69*  --  2.52* 2.33*    Estimated Creatinine Clearance: 24.8 mL/min (by C-G formula based on Cr of 2.33).   Medications:  Prescriptions prior to admission  Medication Sig Dispense Refill Last Dose  . amiodarone (PACERONE) 200 MG tablet Take 1 tablet (200 mg total) by mouth daily. 30 tablet 6 08/30/2014 at Unknown time  . apixaban (ELIQUIS) 5 MG TABS tablet Take 1 tablet (5 mg total) by mouth 2 (two) times daily. 60 tablet 6 08/30/2014 at Unknown time  . furosemide (LASIX) 20 MG tablet Take 2 tablets (40 mg total) by mouth daily. May take additional 20 mg if weight is greater than 154lbs (Patient taking differently: Take 40 mg by mouth 2 (two) times daily. May take additional 20 mg if weight is greater than 154lbs) 60 tablet 6 08/30/2014 at Unknown time  . psyllium (HYDROCIL/METAMUCIL) 95 % PACK Take 1 packet by mouth daily.   08/29/2014  . carvedilol (COREG) 6.25 MG tablet Take 1 tablet (6.25 mg total) by mouth 2 (two) times daily with a meal. 60 tablet 3 08/30/2014  . Fluticasone-Salmeterol (ADVAIR) 500-50 MCG/DOSE AEPB Inhale 1 puff into the lungs 2 (two) times daily. 60 each 11 08/30/2014  . hydrALAZINE (APRESOLINE) 50 MG tablet Take 1 tablet (50 mg total) by mouth every 8 (eight) hours. 90 tablet 6 08/30/2014  . Insulin Glargine (LANTUS SOLOSTAR) 100 UNIT/ML Solostar Pen 10 units SQ qhs (Patient taking  differently: Inject 10 Units into the skin at bedtime. ) 15 mL 11 08/29/2014  . insulin lispro (HUMALOG KWIKPEN) 100 UNIT/ML KiwkPen 7 U SQ w/BF, 8 U SQ w/Lunch, and 9 U SQ w/Supper (Patient taking differently: Inject 7-9 Units into the skin 3 (three) times daily. 7 U SQ w/BF, 8 U SQ w/Lunch, and 9 U SQ w/Supper) 15 mL 11 08/30/2014  . isosorbide mononitrate (IMDUR) 30 MG 24 hr tablet Take 1 tablet (30 mg total) by mouth daily. 30 tablet 6 08/30/2014  . levothyroxine (SYNTHROID, LEVOTHROID) 75 MCG tablet Take 1 tablet (75 mcg total) by mouth daily. 30 tablet 3 08/30/2014  . metolazone (ZAROXOLYN) 2.5 MG tablet Take 1 tablet (2.5 mg total) by mouth once a week. Every Monday (Patient taking differently: Take 2.5 mg by mouth once a week. Monday) 10 tablet 3 08/28/2014  . potassium chloride (K-DUR) 10 MEQ tablet Take 2 tablets (20 mEq total) by mouth daily. 60 tablet 3 08/30/2014  . pravastatin (PRAVACHOL) 80 MG tablet Take 1 tablet (80 mg total) by mouth daily. (Patient taking differently: Take 80 mg by mouth at bedtime. ) 30 tablet 10 08/29/2014  . tiotropium (SPIRIVA) 18 MCG inhalation capsule Place 18 mcg into inhaler and inhale every evening.    08/29/2014    Assessment: 79 yo male on apixiban 5mg  po bid PTA. Pharmacy has been asked to restart. Apixiban 2.5mg  po bid was restarted on  4/7 and Hg down to 7.7 on 4/8 with melena noted and apixiban was placed on hold.  GI consulted and s/p EGD with candidal esophagitis. MD has requested a restart of 2.5mg  po bid on 4/10 due to concern for GIB. Hg/Hct= 9.4/27.2, plt= 103, SCr= 2.33 and CrCl ~ 25.  Per MD- If further fall in Hg, will need capsule endoscopy  Goal of Therapy:  Monitor platelets by anticoagulation protocol: Yes   Plan:  -Begin apixiban at 2.5 mg po bid -Will follow CBC trend  Hildred Laser, Pharm D 09/03/2014 10:50 AM

## 2014-09-03 NOTE — Interval H&P Note (Signed)
History and Physical Interval Note:  09/03/2014 8:36 AM  [name]  has presented today for surgery, with the diagnosis of Melena and anemia  The various methods of treatment have been discussed with the patient and family. After consideration of risks, benefits and other options for treatment, the patient has consented to  Procedure(s): ESOPHAGOGASTRODUODENOSCOPY (EGD) (N/A) as a surgical intervention .  The patient's history has been reviewed, patient examined, no change in status, stable for surgery.  I have reviewed the patient's chart and labs.  Questions were answered to the patient's satisfaction.     Magie Ciampa D

## 2014-09-03 NOTE — Op Note (Signed)
[facility] [place], [address]   ENDOSCOPY PROCEDURE REPORT  PATIENT: [name], [name]  MR#: [id number] BIRTHDATE: 1936-01-02 , 62  yrs. old GENDER: male ENDOSCOPIST:Montrice Montuori Benson [place], MD REFERRED BY: PROCEDURE DATE:  09/20/14 PROCEDURE:   EGD, diagnostic ASA CLASS:    Class III INDICATIONS: melena. MEDICATION: Versed 2 mg IV and Fentanyl 25 mcg IV TOPICAL ANESTHETIC:   none  DESCRIPTION OF PROCEDURE:   After the risks and benefits of the procedure were explained, informed consent was obtained.  The Pentax Gastroscope [id number]  endoscope was introduced through the mouth  and advanced to the second portion of the duodenum .  The instrument was slowly withdrawn as the mucosa was fully examined. Estimated blood loss is zero unless otherwise noted in this procedure report.   FINDINGS: In the esophagus a moderate Candidal esophagitis was identified.  The Z-line was sharp.  No evidence of any inflammation.  In the gastric lumen there was a moderate amount of retained gastric contents.  No evidence of any blood or suspicious lesions in the viewed mucosa.  The duodenum was normal.  No evidence of AVMs, ulcerations, or erosions.    Retroflexed views revealed as previously described.    The scope was then withdrawn from the patient and the procedure completed.  COMPLICATIONS: There were no immediate complications.  ENDOSCOPIC IMPRESSION: 1) Candidal esophagitis. 2) Retained gastric contents  RECOMMENDATIONS: 1) Resume Eliquis. 2) Follow HGB and transfuse if necessary. 3) If HGB drops and/or melena recurs a capsule endoscopy should be pursued. 4) Fluconazole x 10 days for the Candidal esophagitis.   _______________________________ eSignedCarol Ada, MD Sep 20, 2014 9:17 AM     cc:  CPT CODES: ICD CODES:  The ICD and CPT codes recommended by this software are interpretations from the data that the clinical staff has  captured with the software.  The verification of the translation of this report to the ICD and CPT codes and modifiers is the sole responsibility of the health care institution and practicing physician where this report was generated.  [employer] will not be held responsible for the validity of the ICD and CPT codes included on this report.  AMA assumes no liability for data contained or not contained herein. CPT is a Designer, television/film set of the [employer].  PATIENT NAME:  [name], [name] MR#: [id number]

## 2014-09-03 NOTE — Progress Notes (Addendum)
Patient ID: EZEKIAH MASSIE, male   DOB: September 13, 1935, 79 y.o.   MRN: [id number] Advanced Heart Failure Rounding Note   Subjective:   Mr. Gulley is a 79 yo male with a history of HTN, CAD s/p CABG, ICM s/p Medtronic ICD, chronic systolic heart failure, DM2, Vtach, atrial flutter, PVD and GI mass. ECHO (12/22/13) EF 20-25%, Echo EF 40% (12/15) Mod-severe MR.   Admitted from HF clinic with volume overload. Diuresed with IV lasix. At admission he had RHC as noted below with elevated PA pressures suggesting pulmonary venous HTN--RV failure.  Post-cath he developed hypotension and required central access, levo started, and milrinone. Milrinone added for RV support. CCM consulted. Iron stores low.   He is now off norepinephrine, remains on milrinone 0.25. Good BP, co-ox 71%.  CVP 7 this morning.  Starting po torsemide today.  Creatinine decreased compared to yesterday.  Feeling better overall.    Progressive anemia, received 2 units PRBCs on 4/8.  Had melena on 4/8.  Seen by GI, had EGD today, showing candidal esophagitis.  Hemoglobin stable.   RHC 08/31/2014  RA mean 14 RV 76/9/18 PA 72/17, mean 37 PCWP mean 20 with prominent V-waves Oxygen saturations: PA 61% AO 97% Cardiac Output (Fick) 5.88  Cardiac Index (Fick) 3.16 PVR 2.9 Elevated RVEDP and RA pressure, CVP/PCWP = 0.7 suggests RV > LV failure.   Creatinine 2.88> 2.83 > 2.69 > 2.5 > 2.33 Hgb 8.5>7.7 > 7.3 transfusion > 9.6 > 9.4   Objective:   Weight Range:  Vital Signs:   Temp:  [98.3 F (36.8 C)-98.7 F (37.1 C)] 98.3 F (36.8 C) (04/10 0822) Pulse Rate:  [67-81] 69 (04/10 0852) Resp:  [8-43] 43 (04/10 0852) BP: (116-141)/(31-57) 141/57 mmHg (04/10 0910) SpO2:  [89 %-100 %] 100 % (04/10 7001) Arterial Line BP: (101-164)/(24-57) 109/41 mmHg (04/10 0700) Weight:  [154 lb 6.4 oz (70.035 kg)] 154 lb 6.4 oz (70.035 kg) (04/10 0500) Last BM Date: 09/01/14  Weight change: Filed Weights   09/01/14 0403 09/02/14 0443  09/03/14 0500  Weight: 160 lb 7.9 oz (72.8 kg) 156 lb 12 oz (71.1 kg) 154 lb 6.4 oz (70.035 kg)    Intake/Output:   Intake/Output Summary (Last 24 hours) at 09/03/14 0949 Last data filed at 09/03/14 0700  Gross per 24 hour  Intake 520.53 ml  Output   3275 ml  Net -2754.47 ml    PHYSICAL EXAM General: Chronically ill appearing. No resp difficulty HEENT: normal Neck: supple. JVP 7 Carotids 2+ bilaterally; no bruits. No lymphadenopathy or thryomegaly appreciated. RIJ  Cor: PMI normal. Regular rate & rhythm. No rubs, gallops. 2/6 HSM apex.  Lungs: Occasional wheezes. Abdomen: soft, nontender, nondistended. No hepatosplenomegaly. No bruits or masses. Good bowel sounds. Extremities: no cyanosis, clubbing, rash, 2+ ankle edema.  SCDS on RLE and LLE  Neuro: alert & orientedx3, cranial nerves grossly intact. Moves all 4 extremities w/o difficulty. Affect pleasant.  Telemetry: HR 80s, ?rhythm (regular, don't see P waves)  Labs: Basic Metabolic Panel:  Recent Labs Lab 08/30/14 1638 08/31/14 0450 09/01/14 0430 09/02/14 0502 09/03/14 0600  NA 128* 128* 129* 128* 128*  K 4.1 3.7 3.5 3.5 3.2*  CL 89* 90* 90* 88* 87*  CO2 28 26 27 26 27   GLUCOSE 179* 127* 144* 145* 151*  BUN 71* 71* 67* 61* 49*  CREATININE 2.88* 2.83* 2.69* 2.52* 2.33*  CALCIUM 8.4 8.4 8.4 8.6 8.2*  MG 1.9  --   --   --   --  Liver Function Tests: No results for input(s): AST, ALT, ALKPHOS, BILITOT, PROT, ALBUMIN in the last 168 hours. No results for input(s): LIPASE, AMYLASE in the last 168 hours. No results for input(s): AMMONIA in the last 168 hours.  CBC:  Recent Labs Lab 08/31/14 1216 09/01/14 0430 09/01/14 1330 09/02/14 0502 09/03/14 0600  WBC 9.8 9.0 9.4 10.6* 12.1*  NEUTROABS 6.9  --   --   --   --   HGB 8.5* 7.7* 7.3* 9.6* 9.4*  HCT 25.8* 22.8* 21.4* 27.8* 27.2*  MCV 83.5 82.6 82.6 83.5 82.9  PLT 159 150 121* 153 105*    Cardiac Enzymes: No results for input(s): CKTOTAL, CKMB,  CKMBINDEX, TROPONINI in the last 168 hours.  BNP: BNP (last 3 results)  Recent Labs  06/07/14 1421 08/30/14 1639  BNP 528.4* 800.7*    ProBNP (last 3 results)  Recent Labs  01/24/14 1909 02/09/14 0945  PROBNP 6721.0* 2555.0*      Other results:  Imaging: No results found.   Medications:     Scheduled Medications: . [MAR Hold] amiodarone  200 mg Oral Daily  . [MAR Hold] Chlorhexidine Gluconate Cloth  6 each Topical q morning - 10a  . fluconazole  100 mg Oral Daily  . [MAR Hold] guaiFENesin  1,200 mg Oral BID  . hydrALAZINE  10 mg Oral 3 times per day  . [MAR Hold] insulin aspart  0-15 Units Subcutaneous TID WC  . [MAR Hold] insulin aspart  0-5 Units Subcutaneous QHS  . [MAR Hold] insulin aspart  7 Units Subcutaneous Q breakfast   And  . [MAR Hold] insulin aspart  8 Units Subcutaneous Q lunch   And  . [MAR Hold] insulin aspart  9 Units Subcutaneous Q supper  . [MAR Hold] insulin glargine  10 Units Subcutaneous QHS  . isosorbide mononitrate  30 mg Oral Daily  . [MAR Hold] levothyroxine  75 mcg Oral QAC breakfast  . [MAR Hold] mometasone-formoterol  2 puff Inhalation BID  . [MAR Hold] mupirocin ointment  1 application Nasal BID  . [MAR Hold] pantoprazole  40 mg Oral BID  . [MAR Hold] potassium chloride  40 mEq Oral Daily  . potassium chloride  40 mEq Oral Once  . [MAR Hold] pravastatin  80 mg Oral q1800  . [MAR Hold] tiotropium  18 mcg Inhalation QPM  . [MAR Hold] torsemide  40 mg Oral Daily    Infusions: . sodium chloride 10 mL/hr at 09/02/14 2100  . sodium chloride    . milrinone 0.25 mcg/kg/min (09/02/14 2100)  . [MAR Hold] norepinephrine (LEVOPHED) Adult infusion Stopped (09/02/14 1256)    PRN Medications: [MAR Hold] acetaminophen, [MAR Hold] ondansetron (ZOFRAN) IV   Assessment/Plan   Mr Garrel Ridgel is a 79 year old with ischemic cardiomyopathy and acute/chronic systolic heart failure admitted with volume overload and suspected low output.  1. A/C  Systolic Heart Failure: Ischemic cardiomyopathy.  EF 40-45% on echo with elevated PA systolic pressure and moderately dilated RV.  On RHC, good cardiac output but looked like pulmonary venous HTN with more prominent RV failure.  Required norepinephrine for BP support (now off) and on milrinone for RV support.  Anemia likely contributed to presentation.  He diuresed well again yesterday, CVP now 7.  Symptomatically improved.  - Hold beta blocker for now. No ACEI with CKD.  - Decrease milrinone to 0.125 today, possibly off tomorrow.   - Start torsemide 40 daily today.  - Start hydralazine 10 tid + Imdur 30 daily.  2. PAF: on amiodarone 200 mg daily.  Rhythm difficult, regular but hard to see P waves.  Will get ECG.  He will resume Eliquis today at 2.5 mg bid.  3. Mitral Regurgitation: Moderate to severe by echo.  4. AKI on CKD: Suspect cardiorenal. Some improvement with milrinone.  5. DM:  On home regimen + sliding scale 6. CAD S/P CABG: No chest pain.  7. COPD: Some wheezing on exam today.  8. H/O VT: None here.   9. Hypothyroidism 10. Anemia: Fe-deficient, got IV iron.  Hgb to 7.3, transfused 2 units.  2 dark stools, unfortunately did not get hemoccult.  Concern for upper GI bleeding. Had EGD 4/10 showing candidal esophagitis.   - Continue Protonix. - Treat with fluconazole.  - Restart Eliquis at 2.5 mg bid.  If further fall in hemoglobin/active bleeding, will need capsule endoscopy.  11. Out of bed, PT.   Length of Stay: 3  [name] 09/03/2014, 9:49 AM

## 2014-09-03 NOTE — H&P (View-Only) (Signed)
Reason for Consult: Melena and progressive anemia Referring Physician: Cardiology  Jamse Arn HPI: This is a 79 year old [name] with a PMH of CAD s/p CABG, ICM s/p ICD, CHF,Vtach, aflutter, PVD, and HTN admitted with fluid overload.  In the recent past he was evaluated by Dr. Earlean Shawl for a possible colonic mass/thickening.  A colonoscopy was recommended, but the patient declined.  There was thought that it was from a chronic diverticulitis.  During this hospitalization he was noted to have two melenic bowel movements overnight.  His HGB dropped from 8.6 g/dL down to 7.3 g/dL.  At home he was on Eliquis since September, but it was held for the catherization.  The medication was restarted only to be held again with the anemia and melena.  Overnight he received two units of PRBC and he feels better.  Past Medical History  Diagnosis Date  . VENTRICULAR TACHYCARDIA   . PERIPHERAL VASCULAR DISEASE   . HYPERTENSION     Hypertensive retinopathy-grade II OU  . HYPERCHOLESTEROLEMIA   . CORONARY ARTERY DISEASE     Hx of CABG  . Chronic systolic heart failure     Ischemic cardiomyopathy (01/2014 EF 40-45% with hypokinesis + small area of akinesis  . SEBORRHEIC DERMATITIS   . Chronic renal insufficiency, stage III (moderate)     CrCl 40s-50s  . DIVERTICULOSIS OF COLON   . COPD, severe   . COLONIC POLYPS   . ANXIETY   . ICD (implantable cardiac defibrillator) in place   . Type II diabetes mellitus     +background diabetic retinopathy OU  . Chronic lower back pain   . DJD (degenerative joint disease)     "hands" (05/24/2012)  . Osteoarthritis of finger   . History of atrial flutter   . Thrombus 12/2013    on ICD lead--started anticoag and his cardioversion for atrial flutter was postponed.  . Colon stricture 2015    with features worrisome for mass, also with recent rising CEA level (possible colon cancer)-GI MD= Dr. Franki Cabot recent eval/follow up with Dr. Earlean Shawl was summer 2014, at which  time he recommended colonoscopy but pt declined.  . Macular degeneration, age related, nonexudative     OU    Past Surgical History  Procedure Laterality Date  . Coronary artery bypass graft  1990's    CABG X4  . Tonsillectomy  ?1942  . Cardiac defibrillator placement  12/2004    single chamber defibrillator- Medtronic Maxima 8/06 DrKlein [Other][  . Tee without cardioversion N/A 12/22/2013    Procedure: TRANSESOPHAGEAL ECHOCARDIOGRAM (TEE);  Surgeon: Thayer Headings, MD;  Location: [place];  Service: Cardiovascular;  Laterality: N/A;  . Cardioversion N/A 12/22/2013    Procedure: CARDIOVERSION;  Surgeon: Thayer Headings, MD;  Location: [facility] ENDOSCOPY;  Service: Cardiovascular;  Laterality: N/A;  . Cardiovascular stress test  01/12/13    myocard perf imaging: previous infarct with a moderately reduced EF as was known previously.  No new findings.   . Gastric emptying scan  02/02/14    Normal  . Transthoracic echocardiogram  01/25/14; 04/2014    EF 40-45%, diffuse hypokinesis, infero-basilar myocardial akinesis, PA pressure slightly high, valves fine.04/2014-Echo today EF ~40% inferior AK moderate to severe MR  . Abdominal ultrasound  01/2014    GB sludge, o/w normal  . Ct virtual colonoscopy diagnostic  11/2012    Asymmetric thickening of the rectosigmoid colon worrisome for  . Cardiovascular stress test      Lexiscan: There is  a medium sized fixed inferior wall defect of moderate severity involving the apical inferior and mid inferior and basal inferoseptal segments, consistent with old inferior MI. No significant reversible ischemia.  EF 34%, inferior wall hypokinesis--intermediate risk scan (ok to do planned procedure)  . Implantable cardioverter defibrillator revision N/A 05/25/2012    Procedure: IMPLANTABLE CARDIOVERTER DEFIBRILLATOR REVISION;  Surgeon: Evans Lance, MD;  Location[facility] C[facility] LAB;  Service: Cardiovascular;  Laterality: N/A;  . Right heart catheterization N/A 08/31/2014     Procedure: RIGHT HEART CATH;  Surgeon: Larey Dresser, MD;  Location: [facility] CATH LAB;  Service: Cardiovascular;  Laterality: N/A;    Family History  Problem Relation Age of Onset  . Heart attack Father 43  . Heart attack Brother     Vague history    Social History:  reports that he quit smoking about 14 years ago. His smoking use included Cigarettes. He has a 100 pack-year smoking history. He has never used smokeless tobacco. He reports that he drinks alcohol. He reports that he does not use illicit drugs.  Allergies:  Allergies  Allergen Reactions  . Niacin Itching    Niaspan     Medications:  Scheduled: . amiodarone  200 mg Oral Daily  . Chlorhexidine Gluconate Cloth  6 each Topical q morning - 10a  . guaiFENesin  1,200 mg Oral BID  . insulin aspart  0-15 Units Subcutaneous TID WC  . insulin aspart  0-5 Units Subcutaneous QHS  . insulin aspart  7 Units Subcutaneous Q breakfast   And  . insulin aspart  8 Units Subcutaneous Q lunch   And  . insulin aspart  9 Units Subcutaneous Q supper  . insulin glargine  10 Units Subcutaneous QHS  . levothyroxine  75 mcg Oral QAC breakfast  . mometasone-formoterol  2 puff Inhalation BID  . mupirocin ointment  1 application Nasal BID  . pantoprazole  40 mg Oral BID  . potassium chloride  40 mEq Oral Daily  . pravastatin  80 mg Oral q1800  . tiotropium  18 mcg Inhalation QPM  . [START ON 09/03/2014] torsemide  40 mg Oral Daily   Continuous: . sodium chloride 5 mL/hr at 09/01/14 1800  . milrinone 0.25 mcg/kg/min (09/01/14 0800)  . norepinephrine (LEVOPHED) Adult infusion 5 mcg/min (09/01/14 0941)    Results for orders placed or performed during the hospital encounter of 08/31/14 (from the past 24 hour(s))  Glucose, capillary     Status: Abnormal   Collection Time: 09/01/14 12:16 PM  Result Value Ref Range   Glucose-Capillary 118 (H) 70 - 99 mg/dL   Comment 1 Capillary Specimen   Type and screen for Red Blood Exchange     Status: None    Collection Time: 09/01/14  1:15 PM  Result Value Ref Range   ABO/RH(D) O POS    Antibody Screen NEG    Sample Expiration 09/04/2014    Unit Number [id number]    Blood Component Type RED CELLS,LR    Unit division 00    Status of Unit ISSUED,FINAL    Transfusion Status OK TO TRANSFUSE    Crossmatch Result Compatible    Unit Number [id number]    Blood Component Type RED CELLS,LR    Unit division 00    Status of Unit ISSUED,FINAL    Transfusion Status OK TO TRANSFUSE    Crossmatch Result Compatible   ABO/Rh     Status: None   Collection Time: 09/01/14  1:15 PM  Result Value  Ref Range   ABO/RH(D) O POS   CBC     Status: Abnormal   Collection Time: 09/01/14  1:30 PM  Result Value Ref Range   WBC 9.4 4.0 - 10.5 K/uL   RBC 2.59 (L) 4.22 - 5.81 MIL/uL   Hemoglobin 7.3 (L) 13.0 - 17.0 g/dL   HCT 21.4 (L) 39.0 - 52.0 %   MCV 82.6 78.0 - 100.0 fL   MCH 28.2 26.0 - 34.0 pg   MCHC 34.1 30.0 - 36.0 g/dL   RDW 17.4 (H) 11.5 - 15.5 %   Platelets 121 (L) 150 - 400 K/uL  Prepare RBC     Status: None   Collection Time: 09/01/14  3:04 PM  Result Value Ref Range   Order Confirmation ORDER PROCESSED BY BLOOD BANK   Basic metabolic panel     Status: Abnormal   Collection Time: 09/02/14  5:02 AM  Result Value Ref Range   Sodium 128 (L) 135 - 145 mmol/L   Potassium 3.5 3.5 - 5.1 mmol/L   Chloride 88 (L) 96 - 112 mmol/L   CO2 26 19 - 32 mmol/L   Glucose, Bld 145 (H) 70 - 99 mg/dL   BUN 61 (H) 6 - 23 mg/dL   Creatinine, Ser 2.52 (H) 0.50 - 1.[name] mg/dL   Calcium 8.6 8.4 - 10.5 mg/dL   GFR calc non Af Amer 23 (L) >90 mL/min   GFR calc Af Amer 27 (L) >90 mL/min   Anion gap 14 5 - 15  CBC     Status: Abnormal   Collection Time: 09/02/14  5:02 AM  Result Value Ref Range   WBC 10.6 (H) 4.0 - 10.5 K/uL   RBC 3.33 (L) 4.22 - 5.81 MIL/uL   Hemoglobin 9.6 (L) 13.0 - 17.0 g/dL   HCT 27.8 (L) 39.0 - 52.0 %   MCV 83.5 78.0 - 100.0 fL   MCH 28.8 26.0 - 34.0 pg   MCHC 34.5 30.0 - 36.0 g/dL    RDW 16.2 (H) 11.5 - 15.5 %   Platelets 153 150 - 400 K/uL     Dg Chest Port 1 View  08/31/2014   CLINICAL DATA:  Central line placement, history hypertension, coronary artery disease, carotid artery disease, chronic renal insufficiency stage III, type 2 diabetes, former smoker  EXAM: PORTABLE CHEST - 1 VIEW  COMPARISON:  [facility] portable exam 1439 hours compared to 0440 hours  FINDINGS: New RIGHT jugular central venous catheter with tip projecting over SVC.  LEFT subclavian pacemaker/AICD leads with tips projecting over RIGHT ventricle.  Enlargement of cardiac silhouette post CABG with pulmonary vascular congestion.  Mild perihilar infiltrates slightly greater on LEFT favor pulmonary edema.  No pleural effusion or pneumothorax.  Atherosclerotic calcification aorta.  IMPRESSION: No pneumothorax following RIGHT jugular line placement.  Mild pulmonary edema/CHF.   Electronically Signed   By: Lavonia Dana M.D.   On: 08/31/2014 15:05    ROS:  As stated above in the HPI otherwise negative.  Blood pressure 127/[name], pulse 73, temperature 97.5 F (36.4 C), temperature source Oral, resp. rate 16, height 5\' 7"  (1.702 m), weight 71.1 kg (156 lb 12 oz), SpO2 98 %.    PE: Gen: NAD, Alert and Oriented HEENT:  [place]/AT, EOMI Neck: Supple, no LAD Lungs: CTA Bilaterally CV: RRR without M/G/R ABM: Soft, NTND, +BS Ext: No C/C/E  Assessment/Plan: 1) Melena. 2) Anemia.   The presumption is that he has an upper GI bleed.  Clinically he is stable  Plan: 1) EGD tomorrow AM. 2) Follow HGB. 3) Transfuse if necessary.  Rodneshia Greenhouse D 09/02/2014, 10:02 AM

## 2014-09-04 ENCOUNTER — Encounter (HOSPITAL_COMMUNITY): Payer: Self-pay | Admitting: Gastroenterology

## 2014-09-04 DIAGNOSIS — B3781 Candidal esophagitis: Secondary | ICD-10-CM

## 2014-09-04 LAB — GLUCOSE, CAPILLARY
GLUCOSE-CAPILLARY: 104 mg/dL — AB (ref 70–99)
GLUCOSE-CAPILLARY: 82 mg/dL (ref 70–99)
GLUCOSE-CAPILLARY: 147 mg/dL — AB (ref 70–99)
GLUCOSE-CAPILLARY: 183 mg/dL — AB (ref 70–99)
Glucose-Capillary: 137 mg/dL — ABNORMAL HIGH (ref 70–99)
Glucose-Capillary: 129 mg/dL — ABNORMAL HIGH (ref 70–99)
Glucose-Capillary: 137 mg/dL — ABNORMAL HIGH (ref 70–99)
Glucose-Capillary: 109 mg/dL — ABNORMAL HIGH (ref 70–99)
Glucose-Capillary: 105 mg/dL — ABNORMAL HIGH (ref 70–99)
Glucose-Capillary: 167 mg/dL — ABNORMAL HIGH (ref 70–99)
Glucose-Capillary: 109 mg/dL — ABNORMAL HIGH (ref 70–99)
Glucose-Capillary: 122 mg/dL — ABNORMAL HIGH (ref 70–99)
Glucose-Capillary: 69 mg/dL — ABNORMAL LOW (ref 70–99)

## 2014-09-04 LAB — CBC
HCT: 27 % — ABNORMAL LOW (ref 39.0–52.0)
Hemoglobin: 9 g/dL — ABNORMAL LOW (ref 13.0–17.0)
MCH: 28.5 pg (ref 26.0–34.0)
MCHC: 33.3 g/dL (ref 30.0–36.0)
MCV: 85.4 fL (ref 78.0–100.0)
PLATELETS: 98 10*3/uL — AB (ref 150–400)
RBC: 3.16 MIL/uL — AB (ref 4.22–5.81)
RDW: 17 % — ABNORMAL HIGH (ref 11.5–15.5)
WBC: 10 10*3/uL (ref 4.0–10.5)

## 2014-09-04 LAB — BASIC METABOLIC PANEL
ANION GAP: 6 (ref 5–15)
BUN: 39 mg/dL — ABNORMAL HIGH (ref 6–23)
CO2: 38 mmol/L — ABNORMAL HIGH (ref 19–32)
CREATININE: 2.09 mg/dL — AB (ref 0.50–1.35)
Calcium: 8.2 mg/dL — ABNORMAL LOW (ref 8.4–10.5)
Chloride: 90 mmol/L — ABNORMAL LOW (ref 96–112)
GFR calc Af Amer: 33 mL/min — ABNORMAL LOW (ref >90)
GFR calc non Af Amer: 29 mL/min — ABNORMAL LOW (ref >90)
Glucose, Bld: 132 mg/dL — ABNORMAL HIGH (ref 70–99)
Potassium: 3.3 mmol/L — ABNORMAL LOW (ref 3.5–5.1)
SODIUM: 134 mmol/L — AB (ref 135–145)

## 2014-09-04 LAB — CARBOXYHEMOGLOBIN
Carboxyhemoglobin: 2.3 % — ABNORMAL HIGH (ref 0.5–1.5)
Carboxyhemoglobin: 2.1 % — ABNORMAL HIGH (ref 0.5–1.5)
METHEMOGLOBIN: 1 % (ref 0.0–1.5)
METHEMOGLOBIN: 1.3 % (ref 0.0–1.5)
O2 Saturation: 86.2 %
O2 Saturation: 75.4 %
TOTAL HEMOGLOBIN: 8.6 g/dL — AB (ref 13.5–18.0)
Total hemoglobin: 10.8 g/dL — ABNORMAL LOW (ref 13.5–18.0)

## 2014-09-04 MED ORDER — POTASSIUM CHLORIDE CRYS ER 20 MEQ PO TBCR
40.0000 meq | EXTENDED_RELEASE_TABLET | Freq: Once | ORAL | Status: AC
  Administered 2014-09-04: 40 meq via ORAL
  Filled 2014-09-04: qty 2

## 2014-09-04 NOTE — Progress Notes (Signed)
CARDIAC REHAB PHASE I   PRE:  Rate/Rhythm: 81 SR  BP:  Sitting: 164/63        SaO2: 98 RA  MODE:  Ambulation: 0 ft   POST:  Rate/Rhythm:   BP:  Lying: 144/59         SaO2: 87 RA, 94 2L  Pt c/o being thirsty and tired. Offered drink, prepared to ambulate pt. Pt states he is too tired and "doesn't feel right." C/o dizziness while sitting on side of bed. Pt states he walked twice today and "just wants to lay down" and asked if he can "walk tomorrow." Pt BP 144/59 lying in bed. O2 sats on 87 on RA, returned to 94 on 2L O2.  Pt in bed with call light in reach. RN notified. Will follow-up tomorrow.    [id number]  Lenna Sciara, RN, BSN 09/04/2014 3:19 PM

## 2014-09-04 NOTE — Evaluation (Signed)
Physical Therapy Evaluation/ Discharge Patient Details Name: [name] MRN: [id number] DOB: 1935/12/19 Today's Date: 09/04/2014   History of Present Illness  [name] is a 79 yo male with a history of HTN, CAD s/p CABG, ICM s/p Medtronic ICD, chronic systolic heart failure, DM2, Vtach, atrial flutter, PVD and GI mass. ECHO (12/22/13) EF 20-25%, Echo EF 40% (12/15) Mod-severe MR. Pt with SOB and increased volume overload.  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 Pt with good mobility and requested ambulation with RW for pt comfort and reassurance and would not attempt without at this time. Pt without SOB with gait but sats did drop to 86% on RA with gait and able to rise into the 90s with standing rest and cues for breathing technique. Pt educated for energy conservation as well as increased walking program. Pt currently at baseline for transfers, ADLs and gait. All education completed with wife present and pt agreeable to no further needs at this time along with daily mobility acutely.    Follow Up Recommendations No PT follow up    Equipment Recommendations  Rolling walker with 5" wheels (rollator- pt not sure if he wants at this time)    Recommendations for Other Services       Precautions / Restrictions Precautions Precautions: Fall Precaution Comments: pt reports a fall one month ago while carrying heavy load      Mobility  Bed Mobility Overal bed mobility: Modified Independent                Transfers Overall transfer level: Modified independent                  Ambulation/Gait Ambulation/Gait assistance: Modified independent (Device/Increase time) Ambulation Distance (Feet): 550 Feet Assistive device: Rolling walker (2 wheeled) Gait Pattern/deviations: WFL(Within Functional Limits)   Gait velocity interpretation: Below normal speed for age/gender General Gait Details: cues for posture and rest  breaks in standing with pursed lip breathing to increase O2 sats. Pt with sats 86-94% on RA with gait. Pt with drops after at least 100' and immediate increase with grossly 10sec standing rest break.   Stairs            Wheelchair Mobility    Modified Rankin (Stroke Patients Only)       Balance Overall balance assessment: Needs assistance;History of Falls   Sitting balance-Leahy Scale: Good       Standing balance-Leahy Scale: Fair                               Pertinent Vitals/Pain Pain Assessment: No/denies pain  HR 74-87 BP 126/31    Home Living Family/patient expects to be discharged to:: Private residence Living Arrangements: Spouse/significant other Available Help at Discharge: Family;Available 24 hours/day Type of Home: House Home Access: Stairs to enter   [employer] of Steps: 2 Home Layout: One level Home Equipment: None      Prior Function Level of Independence: Independent               Hand Dominance        Extremity/Trunk Assessment   Upper Extremity Assessment: Overall WFL for tasks assessed           Lower Extremity Assessment: Overall WFL for tasks assessed      Cervical / Trunk Assessment: Normal  Communication   Communication: No difficulties  Cognition Arousal/Alertness: Awake/alert Behavior During Therapy: [facility]  for tasks assessed/performed Overall Cognitive Status: Within Functional Limits for tasks assessed                      General Comments      Exercises        Assessment/Plan    PT Assessment Patent does not need any further PT services  PT Diagnosis Difficulty walking   PT Problem List    PT Treatment Interventions     PT Goals (Current goals can be found in the Care Plan section) Acute Rehab PT Goals PT Goal Formulation: All assessment and education complete, DC therapy    Frequency     Barriers to discharge        Co-evaluation               End of  Session   Activity Tolerance: Patient tolerated treatment well Patient left: in chair;with call bell/phone within reach;with family/visitor present Nurse Communication: Mobility status         Time: 5670-1410 PT Time Calculation (min) (ACUTE ONLY): 30 min   Charges:   PT Evaluation $Initial PT Evaluation Tier I: 1 Procedure PT Treatments $Therapeutic Activity: 8-22 mins   PT G CodesMelford Aase 09/04/2014, 9:14 AM Elwyn Reach, [place]

## 2014-09-04 NOTE — Progress Notes (Signed)
Patient ID: ELY BALLEN, male   DOB: 1936-01-24, 79 y.o.   MRN: [id number] Advanced Heart Failure Rounding Note   Subjective:   Mr. Lemelin is a 79 yo male with a history of HTN, CAD s/p CABG, ICM s/p Medtronic ICD, chronic systolic heart failure, DM2, Vtach, atrial flutter, PVD and GI mass. ECHO (12/22/13) EF 20-25%, Echo EF 40% (12/15) Mod-severe MR.   Admitted from HF clinic with volume overload. Diuresed with IV lasix. At admission he had RHC as noted below with elevated PA pressures suggesting pulmonary venous HTN--RV failure.  Post-cath he developed hypotension and required central access, levo started, and milrinone. Milrinone added for RV support. CCM consulted. Iron stores low.   He is now off norepinephrine, remains on milrinone 0.25. Good BP, co-ox 71%.  CVP 7 this morning.  Starting po torsemide today.  Creatinine decreased compared to yesterday.  Feeling better overall.    Progressive anemia, received 2 units PRBCs on 4/8.  Had melena on 4/8.  Seen by GI, had EGD today, showing candidal esophagitis.  Hemoglobin stable. Yesterday IV lasix stopped and he transitioned to torsemide and eliquis was resumed at 2.5 mg twice a day. No BMs in the last 24 hours.   Denies SOB.    RHC 08/31/2014  RA mean 14 RV 76/9/18 PA 72/17, mean 37 PCWP mean 20 with prominent V-waves Oxygen saturations: PA 61% AO 97% Cardiac Output (Fick) 5.88  Cardiac Index (Fick) 3.16 PVR 2.9 Elevated RVEDP and RA pressure, CVP/PCWP = 0.7 suggests RV > LV failure.   Creatinine 2.88> 2.83 > 2.69 > 2.5 > 2.33>2.09  Hgb 8.5>7.7 > 7.3 transfusion > 9.6 > 9.4>9.0    Objective:   Weight Range:  Vital Signs:   Temp:  [98 F (36.7 C)-98.6 F (37 C)] 98 F (36.7 C) (04/11 0445) Pulse Rate:  [64-82] 68 (04/11 0445) Resp:  [9-44] 9 (04/11 0445) BP: (73-141)/(21-61) 126/39 mmHg (04/11 0445) SpO2:  [77 %-100 %] 93 % (04/11 0445) Arterial Line BP: (140-162)/(41-65) 162/65 mmHg (04/10 1300) Weight:   [152 lb 1.9 oz (69 kg)] 152 lb 1.9 oz (69 kg) (04/11 0442) Last BM Date: 09/02/14  Weight change: Filed Weights   09/02/14 0443 09/03/14 0500 09/04/14 0442  Weight: 156 lb 12 oz (71.1 kg) 154 lb 6.4 oz (70.035 kg) 152 lb 1.9 oz (69 kg)    Intake/Output:   Intake/Output Summary (Last 24 hours) at 09/04/14 0700 Last data filed at 09/04/14 0400  Gross per 24 hour  Intake  991.5 ml  Output   3800 ml  Net -2808.5 ml    PHYSICAL EXAM CVP 3-4  General: Chronically ill appearing. No resp difficulty. Sitting on the edge of the bed.  HEENT: normal Neck: supple. JVP 5-6 Carotids 2+ bilaterally; no bruits. No lymphadenopathy or thryomegaly appreciated. RIJ  Cor: PMI normal. Regular rate & rhythm. No rubs, gallops. 2/6 HSM apex.  Lungs: Occasional wheezes. Abdomen: soft, nontender, nondistended. No hepatosplenomegaly. No bruits or masses. Good bowel sounds. Extremities: no cyanosis, clubbing, rash, LLE 1-2+ edema RLE trace. Ted hose in place.   Neuro: alert & orientedx3, cranial nerves grossly intact. Moves all 4 extremities w/o difficulty. Affect pleasant.  Telemetry: SR 70s  Labs: Basic Metabolic Panel:  Recent Labs Lab 08/30/14 1638 08/31/14 0450 09/01/14 0430 09/02/14 0502 09/03/14 0600 09/04/14 0445  NA 128* 128* 129* 128* 128* 134*  K 4.1 3.7 3.5 3.5 3.2* 3.3*  CL 89* 90* 90* 88* 87* 90*  CO2 28 26  27 26 27  38*  GLUCOSE 179* 127* 144* 145* 151* 132*  BUN 71* 71* 67* 61* 49* 39*  CREATININE 2.88* 2.83* 2.69* 2.52* 2.33* 2.09*  CALCIUM 8.4 8.4 8.4 8.6 8.2* 8.2*  MG 1.9  --   --   --   --   --     Liver Function Tests: No results for input(s): AST, ALT, ALKPHOS, BILITOT, PROT, ALBUMIN in the last 168 hours. No results for input(s): LIPASE, AMYLASE in the last 168 hours. No results for input(s): AMMONIA in the last 168 hours.  CBC:  Recent Labs Lab 08/31/14 1216 09/01/14 0430 09/01/14 1330 09/02/14 0502 09/03/14 0600 09/04/14 0445  WBC 9.8 9.0 9.4 10.6* 12.1*  10.0  NEUTROABS 6.9  --   --   --   --   --   HGB 8.5* 7.7* 7.3* 9.6* 9.4* 9.0*  HCT 25.8* 22.8* 21.4* 27.8* 27.2* 27.0*  MCV 83.5 82.6 82.6 83.5 82.9 85.4  PLT 159 150 121* 153 105* 98*    Cardiac Enzymes: No results for input(s): CKTOTAL, CKMB, CKMBINDEX, TROPONINI in the last 168 hours.  BNP: BNP (last 3 results)  Recent Labs  06/07/14 1421 08/30/14 1639  BNP 528.4* 800.7*    ProBNP (last 3 results)  Recent Labs  01/24/14 1909 02/09/14 0945  PROBNP 6721.0* 2555.0*      Other results:  Imaging: No results found.   Medications:     Scheduled Medications: . amiodarone  200 mg Oral Daily  . apixaban  2.5 mg Oral BID  . Chlorhexidine Gluconate Cloth  6 each Topical q morning - 10a  . fluconazole  100 mg Oral Daily  . guaiFENesin  1,200 mg Oral BID  . hydrALAZINE  10 mg Oral 3 times per day  . insulin aspart  0-15 Units Subcutaneous TID WC  . insulin aspart  0-5 Units Subcutaneous QHS  . insulin aspart  7 Units Subcutaneous Q breakfast   And  . insulin aspart  8 Units Subcutaneous Q lunch   And  . insulin aspart  9 Units Subcutaneous Q supper  . insulin glargine  10 Units Subcutaneous QHS  . isosorbide mononitrate  30 mg Oral Daily  . levothyroxine  75 mcg Oral QAC breakfast  . mometasone-formoterol  2 puff Inhalation BID  . mupirocin ointment  1 application Nasal BID  . pantoprazole  40 mg Oral BID  . potassium chloride  40 mEq Oral Daily  . pravastatin  80 mg Oral q1800  . tiotropium  18 mcg Inhalation QPM  . torsemide  40 mg Oral Daily    Infusions: . sodium chloride 10 mL/hr (09/03/14 2015)  . milrinone 0.125 mcg/kg/min (09/03/14 2000)  . norepinephrine (LEVOPHED) Adult infusion Stopped (09/02/14 1256)    PRN Medications: acetaminophen, ondansetron (ZOFRAN) IV   Assessment/Plan   Mr Garrel Ridgel is a 79 year old with ischemic cardiomyopathy and acute/chronic systolic heart failure admitted with volume overload and suspected low output.  1.  A/C Systolic Heart Failure: Ischemic cardiomyopathy.  EF 40-45% on echo with elevated PA systolic pressure and moderately dilated RV.  On RHC, good cardiac output but looked like pulmonary venous HTN with more prominent RV failure.  Required norepinephrine for BP support (now off) and on milrinone for RV support.  Anemia likely contributed to presentation.  He diuresed well again yesterday, CVP now 3-4.  Symptomatically improved.  - Hold beta blocker for now. No ACEI with CKD.  - Stop milrinone today - Continue torsemide 40  daily today. Supplement K  - Continue  hydralazine 10 tid + Imdur 30 daily.  2. PAF: on amiodarone 200 mg daily.  NSR today.  Had EGD. Continue Eliquis today at 2.5 mg bid.  3. Mitral Regurgitation: Moderate to severe by echo.  4. AKI on CKD: Suspect cardiorenal. Some improvement with milrinone.  5. DM:  On home regimen + sliding scale 6. CAD S/P CABG: No chest pain.  7. COPD: Stable.  8. H/O VT: None here.   9. Hypothyroidism 10. Anemia: Fe-deficient, got IV iron.  Hgb to 7.3, transfused 2 units.  2 dark stools, unfortunately did not get hemoccult.  Concern for upper GI bleeding. Had EGD 4/10 showing candidal esophagitis.  Hgb down a little today. 9,4>9.0. No BMs - Continue Protonix. - On fluconazole Day 2/10  - Restart Eliquis at 2.5 mg bid.  If further fall in hemoglobin/active bleeding, will need capsule endoscopy.  11. Out of bed, PT.   Length of Stay: 4  CLEGG,AMY NP-C  09/04/2014, 7:00 AM  Patient seen and examined with Darrick Grinder, NP. We discussed all aspects of the encounter. I agree with the assessment and plan as stated above.   Much improved. Hgb relatively stable. Back on Eliquis. Now of milrinone. Co-ox and CVP ok. Continue torsemide. Transfer to SDU. Possibly home tomorrow if co-ox ok. Supp K+. On fluconazole of candidal esophagitis.   Ambulate with CR.   Earmon Sherrow,MD 7:16 AM

## 2014-09-05 LAB — BASIC METABOLIC PANEL
Anion gap: 8 (ref 5–15)
BUN: 32 mg/dL — AB (ref 6–23)
CHLORIDE: 91 mmol/L — AB (ref 96–112)
CO2: 35 mmol/L — ABNORMAL HIGH (ref 19–32)
Calcium: 8.2 mg/dL — ABNORMAL LOW (ref 8.4–10.5)
Creatinine, Ser: 2.23 mg/dL — ABNORMAL HIGH (ref 0.50–1.35)
GFR calc Af Amer: 31 mL/min — ABNORMAL LOW (ref >90)
GFR calc non Af Amer: 27 mL/min — ABNORMAL LOW (ref >90)
Glucose, Bld: 164 mg/dL — ABNORMAL HIGH (ref 70–99)
POTASSIUM: 4.4 mmol/L (ref 3.5–5.1)
Sodium: 134 mmol/L — ABNORMAL LOW (ref 135–145)

## 2014-09-05 LAB — CARBOXYHEMOGLOBIN
CARBOXYHEMOGLOBIN: 2.2 % — AB (ref 0.5–1.5)
Carboxyhemoglobin: 1.9 % — ABNORMAL HIGH (ref 0.5–1.5)
METHEMOGLOBIN: 1.1 % (ref 0.0–1.5)
METHEMOGLOBIN: 0.9 % (ref 0.0–1.5)
O2 SAT: 54.3 %
O2 Saturation: 81.4 %
TOTAL HEMOGLOBIN: 8.4 g/dL — AB (ref 13.5–18.0)
TOTAL HEMOGLOBIN: 9.3 g/dL — AB (ref 13.5–18.0)

## 2014-09-05 LAB — CBC
HEMATOCRIT: 29.1 % — AB (ref 39.0–52.0)
Hemoglobin: 9.5 g/dL — ABNORMAL LOW (ref 13.0–17.0)
MCH: 28.3 pg (ref 26.0–34.0)
MCHC: 32.6 g/dL (ref 30.0–36.0)
MCV: 86.6 fL (ref 78.0–100.0)
Platelets: 123 10*3/uL — ABNORMAL LOW (ref 150–400)
RBC: 3.36 MIL/uL — ABNORMAL LOW (ref 4.22–5.81)
RDW: 17.5 % — ABNORMAL HIGH (ref 11.5–15.5)
WBC: 12.4 10*3/uL — AB (ref 4.0–10.5)

## 2014-09-05 LAB — GLUCOSE, CAPILLARY
GLUCOSE-CAPILLARY: 51 mg/dL — AB (ref 70–99)
GLUCOSE-CAPILLARY: 84 mg/dL (ref 70–99)
Glucose-Capillary: 76 mg/dL (ref 70–99)
Glucose-Capillary: 168 mg/dL — ABNORMAL HIGH (ref 70–99)

## 2014-09-05 MED ORDER — TORSEMIDE 20 MG PO TABS: 40.0000 mg | ORAL_TABLET | Freq: Every day | ORAL | Status: DC

## 2014-09-05 MED ORDER — METOLAZONE 2.5 MG PO TABS: 2.5000 mg | ORAL_TABLET | ORAL | Status: DC | PRN

## 2014-09-05 MED ORDER — HYDRALAZINE HCL 25 MG PO TABS: 12.5000 mg | ORAL_TABLET | Freq: Three times a day (TID) | ORAL | Status: DC

## 2014-09-05 MED ORDER — APIXABAN 5 MG PO TABS: 2.5000 mg | ORAL_TABLET | Freq: Two times a day (BID) | ORAL | Status: DC

## 2014-09-05 MED ORDER — FLUCONAZOLE 100 MG PO TABS: 100.0000 mg | ORAL_TABLET | Freq: Every day | ORAL | Status: DC

## 2014-09-05 MED ORDER — POTASSIUM CHLORIDE ER 10 MEQ PO TBCR: 20.0000 meq | EXTENDED_RELEASE_TABLET | Freq: Two times a day (BID) | ORAL | Status: DC

## 2014-09-05 MED ORDER — PANTOPRAZOLE SODIUM 40 MG PO TBEC: 40.0000 mg | DELAYED_RELEASE_TABLET | Freq: Two times a day (BID) | ORAL | Status: DC

## 2014-09-05 NOTE — Discharge Summary (Signed)
Advanced Heart Failure Team  Discharge Summary   Patient ID: [name] MRN: [id number], DOB/AGE: 10-08-1935 79 y.o. Admit date: 08/31/2014 D/C date:     09/05/2014   Primary Discharge Diagnoses:  1. A/C Systolic Heart Failure: Ischemic cardiomyopathy.  2. PAF: on amiodarone 200 mg daily. Continue Eliquis today at 2.5 mg bid.  3. Mitral Regurgitation: Moderate to severe by echo.  4. AKI on CKD: Suspect cardiorenal. Creatinine baseline 2.3-2.4  5. DM: On home regimen + sliding scale 6. CAD S/P CABG: No chest pain.  7. COPD: Stable.  8. H/O VT: None here.  9. Hypothyroidism 10. Anemia- had 2 UPRBCs 7.3>9.5 . EGD showed esophogeal candidiasis  --> To complete 10 days of fluconazole. Should complete 09/12/2014  11. Chronic Respiratory Failure- room oxygen saturation 86% --> home oxygen ordered 2 liters [place]  Hospital Course:  [name] is a 79 yo male with a history of HTN, CAD s/p CABG, ICM s/p Medtronic ICD, chronic systolic heart failure, DM2, Vtach, atrial flutter, PVD and GI mass. ECHO (12/22/13) EF 20-25%-->Echo EF 40% (12/15) Mod-severe MR.   Admitted from the HF clinic with increased dyspnea, fatigue, and volume overload. He was diuresed with IV lasix and had RHC as noted below with elevated PA pressures suggesting pulmonary venous HTN--RV failure.Milrinone was started for RV support.  Post cath he developed hypotension and was started norepinephrine to maintain SBP. CCM consult for central access and hypotension. As he improved norepinephrine and milrinone were weaned off. As his volume status improved he transitioned from IV lasix to torsemide 40 mg daily. Overall he diuresed 12 pounds. He is not on bb, ace, spiro for now with CKD and RV failure.   He developed progressive anemia and melena. Hemoglobin went down to 7.3 and he received 2UPRBCs. Appropriate rise noted. Eliquis was placed on hold. GI consulted and he had EGD that showed no bleeding and candidal  esophagitis. He was started fluconazole and will continue for a total of 10 days. He was restarted on eliquis at 2.5 mg twice a day without difficulty.    He was evaluated by cardiac rehab and had oxygen saturations 86% on room air. Home oxygen 2 liters nasal cannula ordered from [facility]. He will continue to be followed closely in the HF clinic and has follow up next week. Plan to check BMET and CBC at that time.    RHC 08/31/2014  RA mean 14 RV 76/9/18 PA 72/17, mean 37 PCWP mean 20 with prominent V-waves Oxygen saturations: PA 61% AO 97% Cardiac Output (Fick) 5.88  Cardiac Index (Fick) 3.16 PVR 2.9 Elevated RVEDP and RA pressure, CVP/PCWP = 0.7 suggests RV > LV failure.   Discharge Weight Range: 150 pounds.  Discharge Vitals: Blood pressure 113/34, pulse 69, temperature 98.4 F (36.9 C), temperature source Oral, resp. rate 12, height 5\' 7"  (1.702 m), weight 150 lb (68.04 kg), SpO2 99 %.  Labs: Lab Results  Component Value Date   WBC 12.4* 09/05/2014   HGB 9.5* 09/05/2014   HCT 29.1* 09/05/2014   MCV 86.6 09/05/2014   PLT 123* 09/05/2014    Recent Labs Lab 09/05/14 0536  NA 134*  K 4.4  CL 91*  CO2 35*  BUN 32*  CREATININE 2.23*  CALCIUM 8.2*  GLUCOSE 164*   Lab Results  Component Value Date   CHOL 83 02/01/2014   HDL 10* 02/01/2014   LDLCALC 53 02/01/2014   TRIG 102 02/01/2014   BNP (last 3 results)  Recent Labs  06/07/14 1421 08/30/14 1639  BNP 528.4* 800.7*    ProBNP (last 3 results)  Recent Labs  01/24/14 1909 02/09/14 0945  PROBNP 6721.0* 2555.0*     Diagnostic Studies/Procedures   No results found.  Discharge Medications     Medication List    STOP taking these medications        carvedilol 6.25 MG tablet  Commonly known as:  COREG     furosemide 20 MG tablet  Commonly known as:  LASIX     psyllium 95 % Pack  Commonly known as:  HYDROCIL/METAMUCIL      TAKE these medications        amiodarone 200 MG tablet   Commonly known as:  PACERONE  Take 1 tablet (200 mg total) by mouth daily.     apixaban 5 MG Tabs tablet  Commonly known as:  ELIQUIS  Take 0.5 tablets (2.5 mg total) by mouth 2 (two) times daily.     fluconazole 100 MG tablet  Commonly known as:  DIFLUCAN  Take 1 tablet (100 mg total) by mouth daily.     Fluticasone-Salmeterol 500-50 MCG/DOSE Aepb  Commonly known as:  ADVAIR  Inhale 1 puff into the lungs 2 (two) times daily.     hydrALAZINE 25 MG tablet  Commonly known as:  APRESOLINE  Take 0.5 tablets (12.5 mg total) by mouth 3 (three) times daily.     Insulin Glargine 100 UNIT/ML Solostar Pen  Commonly known as:  LANTUS SOLOSTAR  10 units SQ qhs     insulin lispro 100 UNIT/ML KiwkPen  Commonly known as:  HUMALOG KWIKPEN  7 U SQ w/BF, 8 U SQ w/Lunch, and 9 U SQ w/Supper     isosorbide mononitrate 30 MG 24 hr tablet  Commonly known as:  IMDUR  Take 1 tablet (30 mg total) by mouth daily.     levothyroxine 75 MCG tablet  Commonly known as:  SYNTHROID, LEVOTHROID  Take 1 tablet (75 mcg total) by mouth daily.     metolazone 2.5 MG tablet  Commonly known as:  ZAROXOLYN  Take 1 tablet (2.5 mg total) by mouth as needed.     pantoprazole 40 MG tablet  Commonly known as:  PROTONIX  Take 1 tablet (40 mg total) by mouth 2 (two) times daily.     potassium chloride 10 MEQ tablet  Commonly known as:  K-DUR  Take 2 tablets (20 mEq total) by mouth 2 (two) times daily.     pravastatin 80 MG tablet  Commonly known as:  PRAVACHOL  Take 1 tablet (80 mg total) by mouth daily.     tiotropium 18 MCG inhalation capsule  Commonly known as:  SPIRIVA  Place 18 mcg into inhaler and inhale every evening.     torsemide 20 MG tablet  Commonly known as:  DEMADEX  Take 2 tablets (40 mg total) by mouth daily.        Disposition   The patient will be discharged in stable condition to home.     Discharge Instructions    Contraindication to ACEI at discharge    Complete by:  As  directed      Diet - low sodium heart healthy    Complete by:  As directed      Heart Failure patients record your daily weight using the same scale at the same time of day    Complete by:  As directed      Increase activity slowly    Complete  by:  As directed           Follow-up Information    Follow up with Glori Bickers, MD On 09/13/2014.   Specialty:  Cardiology   Why:  at 0920 in the Advanced Heart Failure Clinic--gate code [id number]--please bring all medications to appt   Contact information:   [place] [address] [phone]         Duration of Discharge Encounter: Greater than 35 minutes   Signed, CLEGG,AMY NP-C   09/05/2014, 10:13 AM  Patient seen and examined with Darrick Grinder, NP. We discussed all aspects of the encounter. I agree with the assessment and plan as stated above.   He is improved. [place] for d/c today with close f/u in HF Clinic.   Tyona Nilsen,MD 6:30 PM

## 2014-09-05 NOTE — Care Management Note (Signed)
    Page 1 of 1   09/05/2014     11:34:11 AM CARE MANAGEMENT NOTE 09/05/2014  Patient:  [name], [name]   Account Number:  [id number]  Date Initiated:  09/04/2014  Documentation initiated by:  Luz Lex  Subjective/Objective Assessment:   Admitted through HF clinic with CHF.  Started on Milrinone and lasix drip initially.     Action/Plan:   Anticipated DC Date:  09/08/2014   Anticipated DC Plan:  [place]  CM consult      Choice offered to / List presented to:          [facility] arranged  [place] - 11 Patient Refused      Status of service:  Completed, signed off Medicare Important Message given?  YES (If response is "NO", the following Medicare IM given date fields will be blank) Date Medicare IM given:  09/05/2014 Medicare IM given by:  Maziyah Vessel Date Additional Medicare IM given:   Additional Medicare IM given by:    Discharge Disposition:  HOME/SELF CARE  Per UR Regulation:  Reviewed for med. necessity/level of care/duration of stay  If discussed at [place] of Stay Meetings, dates discussed:   09/05/2014    Comments:  09/05/14 [place] RN MSN BSN CCM Pt discharged, again discussed home health services and pt declined - states he does not need same, plans to f/u @ HF Clinic next week.  Also discussed home O2 - pt states he did not feel well most of the day yesterday and that was reason for low O2 sats when ambulating.  O2 sats 89-96% today when ambulating with cardiac rehab.  Pt refuses to ambulate for re-eval, states he does not need O2.  09-04-14 9:20am Luz Lex, [place] Prior admission home with wife - refused HH on discharge. May need Milrinone on discharge ????  CM will continue to follow.

## 2014-09-05 NOTE — Progress Notes (Signed)
Removed central line from patients RIJ at 1230, he tolerated well, vaseline gauze, gauze and tegaderm intact, no signs of bleeding.    Discharged patient at 68 via wheelchair to personal vehicle, reviewed all follow up appointments, medications, home care with him and wife,  They repeated information and were able to verbalize understanding.  Central line dressing still intact no signs of drainage.

## 2014-09-05 NOTE — Progress Notes (Signed)
Patient ID: DEV DHONDT, male   DOB: Feb 06, 1936, 79 y.o.   MRN: [id number] Advanced Heart Failure Rounding Note   Subjective:   Mr. Mccauley is a 79 yo male with a history of HTN, CAD s/p CABG, ICM s/p Medtronic ICD, chronic systolic heart failure, DM2, Vtach, atrial flutter, PVD and GI mass. ECHO (12/22/13) EF 20-25%, Echo EF 40% (12/15) Mod-severe MR.   Admitted from HF clinic with volume overload. Diuresed with IV lasix. At admission he had RHC as noted below with elevated PA pressures suggesting pulmonary venous HTN--RV failure.  Post-cath he developed hypotension and required central access, levo started, and milrinone. Milrinone added for RV support. CCM consulted. Iron stores low.   He is now off norepinephrine, remains on milrinone 0.25. Good BP, co-ox 71%.  CVP 7 this morning.  Starting po torsemide today.  Creatinine decreased compared to yesterday.  Feeling better overall.    Progressive anemia, received 2 units PRBCs on 4/8.  Had melena on 4/8.  Seen by GI, had EGD today, showing candidal esophagitis.  Hemoglobin stable. Yesterday milrinone stopped. Milrinone down to 54% and renal function up. Weight down another 2 pounds.  Yesterday had increased fatigue but resolved last evening.   Denies SOB.    RHC 08/31/2014  RA mean 14 RV 76/9/18 PA 72/17, mean 37 PCWP mean 20 with prominent V-waves Oxygen saturations: PA 61% AO 97% Cardiac Output (Fick) 5.88  Cardiac Index (Fick) 3.16 PVR 2.9 Elevated RVEDP and RA pressure, CVP/PCWP = 0.7 suggests RV > LV failure.   Creatinine 2.88> 2.83 > 2.69 > 2.5 > 2.33>2.09 >2.23  Hgb 8.5>7.7 > 7.3 transfusion > 9.6 > 9.4>9.0 >9.5    Objective:   Weight Range:  Vital Signs:   Temp:  [97 F (36.1 C)-98.6 F (37 C)] 98.5 F (36.9 C) (04/12 0300) Pulse Rate:  [70-93] 81 (04/12 0600) Resp:  [9-18] 15 (04/12 0600) BP: (98-164)/(22-63) 129/43 mmHg (04/12 0600) SpO2:  [92 %-100 %] 98 % (04/12 0600) Weight:  [150 lb (68.04  kg)-152 lb 6.4 oz (69.128 kg)] 150 lb (68.04 kg) (04/12 0600) Last BM Date: 09/04/14  Weight change: Filed Weights   09/04/14 0442 09/05/14 0300 09/05/14 0600  Weight: 152 lb 1.9 oz (69 kg) 152 lb 6.4 oz (69.128 kg) 150 lb (68.04 kg)    Intake/Output:   Intake/Output Summary (Last 24 hours) at 09/05/14 0733 Last data filed at 09/05/14 0600  Gross per 24 hour  Intake 1335.6 ml  Output   2125 ml  Net -789.4 ml    PHYSICAL EXAM CVP 6-7 General: Chronically ill appearing. No resp difficulty. Sitting on the edge of the bed.  HEENT: normal Neck: supple. JVP 5-6 Carotids 2+ bilaterally; no bruits. No lymphadenopathy or thryomegaly appreciated. RIJ  Cor: PMI normal. Regular rate & rhythm. No rubs, gallops. 2/6 HSM apex.  Lungs: Occasional wheezes. Abdomen: soft, nontender, nondistended. No hepatosplenomegaly. No bruits or masses. Good bowel sounds. Extremities: no cyanosis, clubbing, rash, LLE 1-2+ edema RLE trace. Ted hose in place.   Neuro: alert & orientedx3, cranial nerves grossly intact. Moves all 4 extremities w/o difficulty. Affect pleasant.  Telemetry: SR 70s  Labs: Basic Metabolic Panel:  Recent Labs Lab 08/30/14 1638  09/01/14 0430 09/02/14 0502 09/03/14 0600 09/04/14 0445 09/05/14 0536  NA 128*  < > 129* 128* 128* 134* 134*  K 4.1  < > 3.5 3.5 3.2* 3.3* 4.4  CL 89*  < > 90* 88* 87* 90* 91*  CO2 28  < >  27 26 27  38* 35*  GLUCOSE 179*  < > 144* 145* 151* 132* 164*  BUN 71*  < > 67* 61* 49* 39* 32*  CREATININE 2.88*  < > 2.69* 2.52* 2.33* 2.09* 2.23*  CALCIUM 8.4  < > 8.4 8.6 8.2* 8.2* 8.2*  MG 1.9  --   --   --   --   --   --   < > = values in this interval not displayed.  Liver Function Tests: No results for input(s): AST, ALT, ALKPHOS, BILITOT, PROT, ALBUMIN in the last 168 hours. No results for input(s): LIPASE, AMYLASE in the last 168 hours. No results for input(s): AMMONIA in the last 168 hours.  CBC:  Recent Labs Lab 08/31/14 1216  09/01/14 1330  09/02/14 0502 09/03/14 0600 09/04/14 0445 09/05/14 0536  WBC 9.8  < > 9.4 10.6* 12.1* 10.0 12.4*  NEUTROABS 6.9  --   --   --   --   --   --   HGB 8.5*  < > 7.3* 9.6* 9.4* 9.0* 9.5*  HCT 25.8*  < > 21.4* 27.8* 27.2* 27.0* 29.1*  MCV 83.5  < > 82.6 83.5 82.9 85.4 86.6  PLT 159  < > 121* 153 105* 98* 123*  < > = values in this interval not displayed.  Cardiac Enzymes: No results for input(s): CKTOTAL, CKMB, CKMBINDEX, TROPONINI in the last 168 hours.  BNP: BNP (last 3 results)  Recent Labs  06/07/14 1421 08/30/14 1639  BNP 528.4* 800.7*    ProBNP (last 3 results)  Recent Labs  01/24/14 1909 02/09/14 0945  PROBNP 6721.0* 2555.0*      Other results:  Imaging: No results found.   Medications:     Scheduled Medications: . amiodarone  200 mg Oral Daily  . apixaban  2.5 mg Oral BID  . Chlorhexidine Gluconate Cloth  6 each Topical q morning - 10a  . fluconazole  100 mg Oral Daily  . guaiFENesin  1,200 mg Oral BID  . hydrALAZINE  10 mg Oral 3 times per day  . insulin aspart  0-15 Units Subcutaneous TID WC  . insulin aspart  0-5 Units Subcutaneous QHS  . insulin aspart  7 Units Subcutaneous Q breakfast   And  . insulin aspart  8 Units Subcutaneous Q lunch   And  . insulin aspart  9 Units Subcutaneous Q supper  . insulin glargine  10 Units Subcutaneous QHS  . isosorbide mononitrate  30 mg Oral Daily  . levothyroxine  75 mcg Oral QAC breakfast  . mometasone-formoterol  2 puff Inhalation BID  . mupirocin ointment  1 application Nasal BID  . pantoprazole  40 mg Oral BID  . potassium chloride  40 mEq Oral Daily  . pravastatin  80 mg Oral q1800  . tiotropium  18 mcg Inhalation QPM  . torsemide  40 mg Oral Daily    Infusions: . sodium chloride 10 mL/hr at 09/04/14 2000  . norepinephrine (LEVOPHED) Adult infusion Stopped (09/02/14 1256)    PRN Medications: acetaminophen, ondansetron (ZOFRAN) IV   Assessment/Plan   Mr Garrel Ridgel is a 79 year old with  ischemic cardiomyopathy and acute/chronic systolic heart failure admitted with volume overload and suspected low output.  1. A/C Systolic Heart Failure: Ischemic cardiomyopathy.  EF 40-45% on echo with elevated PA systolic pressure and moderately dilated RV.  On RHC, good cardiac output but looked like pulmonary venous HTN with more prominent RV failure.  Required norepinephrine for BP support (now  off) and on milrinone for RV support.  Anemia likely contributed to presentation.  Weight down another 2 pounds. Urine output slowing. CVP 6-7.   - Hold beta blocker for now. No ACEI with CKD.  - CO-OX down to 54% off milrinone. Repeat CO-OX now -> 81%. Will keep off  Milrinone for now and see how he does.  - Continue torsemide 40 daily today.  - Continue  hydralazine 10 tid + Imdur 30 daily.  2. PAF: on amiodarone 200 mg daily.  NSR today.  Had EGD. Continue Eliquis today at 2.5 mg bid.  3. Mitral Regurgitation: Moderate to severe by echo.  4. AKI on CKD: Suspect cardiorenal. Improved 5. DM:  On home regimen + sliding scale 6. CAD S/P CABG: No chest pain.  7. COPD: Stable.  8. H/O VT: None here.   9. Hypothyroidism 10. Anemia: Fe-deficient, got IV iron.  Hgb to 7.3, transfused 2 units.  2 dark stools, unfortunately did not get hemoccult.  Concern for upper GI bleeding. Had EGD 4/10 showing candidal esophagitis.  Hgb down a little today. 9,4>9.0. No BMs - Continue Protonix. - On fluconazole Day 3/10  -Continue  Eliquis at 2.5 mg bid.  If further fall in hemoglobin/active bleeding, will need capsule endoscopy.  11. Out of bed, PT.   Length of Stay: 5  CLEGG,AMY NP-C  09/05/2014, 7:33 AM  7:33 AM   Patient seen and examined with Darrick Grinder, NP. We discussed all aspects of the encounter. I agree with the assessment and plan as stated above.   Improved. Can go home today on current regimen with close outpatient f/u in HF Clinic. Watch CBC and BMET closely.   Rishika Mccollom,MD 8:41 AM

## 2014-09-05 NOTE — Progress Notes (Signed)
Subjective: No acute events.  Feeling well.  He would like to go home.  Objective: Vital signs in last 24 hours: Temp:  [97 F (36.1 C)-98.6 F (37 C)] 98.5 F (36.9 C) (04/12 0300) Pulse Rate:  [70-93] 81 (04/12 0600) Resp:  [9-18] 15 (04/12 0600) BP: (98-164)/(22-63) 129/43 mmHg (04/12 0600) SpO2:  [92 %-100 %] 98 % (04/12 0600) Weight:  [68.04 kg (150 lb)-69.128 kg (152 lb 6.4 oz)] 68.04 kg (150 lb) (04/12 0600) Last BM Date: 09/04/14  Intake/Output from previous day: 04/11 0701 - 04/12 0700 In: 1335.6 [P.O.:1100; I.V.:235.6] Out: 2125 [Urine:2125] Intake/Output this shift:    General appearance: alert and no distress GI: soft, non-tender; bowel sounds normal; no masses,  no organomegaly  Lab Results:  Recent Labs  09/03/14 0600 09/04/14 0445 09/05/14 0536  WBC 12.1* 10.0 12.4*  HGB 9.4* 9.0* 9.5*  HCT 27.2* 27.0* 29.1*  PLT 105* 98* 123*   BMET  Recent Labs  09/03/14 0600 09/04/14 0445 09/05/14 0536  NA 128* 134* 134*  K 3.2* 3.3* 4.4  CL 87* 90* 91*  CO2 27 38* 35*  GLUCOSE 151* 132* 164*  BUN 49* 39* 32*  CREATININE 2.33* 2.09* 2.23*  CALCIUM 8.2* 8.2* 8.2*   LFT No results for input(s): PROT, ALBUMIN, AST, ALT, ALKPHOS, BILITOT, BILIDIR, IBILI in the last 72 hours. PT/INR No results for input(s): LABPROT, INR in the last 72 hours. Hepatitis Panel No results for input(s): HEPBSAG, HCVAB, HEPAIGM, HEPBIGM in the last 72 hours. C-Diff No results for input(s): CDIFFTOX in the last 72 hours. Fecal Lactopherrin No results for input(s): FECLLACTOFRN in the last 72 hours.  Studies/Results: No results found.  Medications:  Scheduled: . amiodarone  200 mg Oral Daily  . apixaban  2.5 mg Oral BID  . Chlorhexidine Gluconate Cloth  6 each Topical q morning - 10a  . fluconazole  100 mg Oral Daily  . guaiFENesin  1,200 mg Oral BID  . hydrALAZINE  10 mg Oral 3 times per day  . insulin aspart  0-15 Units Subcutaneous TID WC  . insulin aspart  0-5  Units Subcutaneous QHS  . insulin aspart  7 Units Subcutaneous Q breakfast   And  . insulin aspart  8 Units Subcutaneous Q lunch   And  . insulin aspart  9 Units Subcutaneous Q supper  . insulin glargine  10 Units Subcutaneous QHS  . isosorbide mononitrate  30 mg Oral Daily  . levothyroxine  75 mcg Oral QAC breakfast  . mometasone-formoterol  2 puff Inhalation BID  . mupirocin ointment  1 application Nasal BID  . pantoprazole  40 mg Oral BID  . potassium chloride  40 mEq Oral Daily  . pravastatin  80 mg Oral q1800  . tiotropium  18 mcg Inhalation QPM  . torsemide  40 mg Oral Daily   Continuous: . sodium chloride 10 mL/hr at 09/04/14 2000  . norepinephrine (LEVOPHED) Adult infusion Stopped (09/02/14 1256)    Assessment/Plan: 1) Anemia. 2) Heme positive stool. 3) CHF. 4) Mild Candidal esophagitis.   Clinically he is stable.  No overt evidence of bleeding.  HGB is stable on Eliquis.    Plan: 1) Fluconazole 100 mg for 10 days total. 2) If anemia recurs a capsule endoscopy will be performed. 3) Signing off.  LOS: 5 days   Falisa Lamora D 09/05/2014, 7:14 AM

## 2014-09-05 NOTE — Progress Notes (Signed)
Notified by Nurse tech of low CBG, patietn sitting up in chair, assymptomatic, alert and oriented, provided 8 oz of orange juice, repeat check.  Will continue to monitor

## 2014-09-05 NOTE — Progress Notes (Signed)
CARDIAC REHAB PHASE I   PRE:  Rate/Rhythm: 70 SR  BP:  Supine: 110/39  Sitting:   Standing:    SaO2: 94% RA  MODE:  Ambulation: 500 ft   POST:  Rate/Rhythm: up to 120's but down with rest  BP:  Supine:   Sitting: 146/65  Standing:    SaO2: 89-96%RA 1000-1050 Pt ready to walk. Walked 500 ft with minimal asst, resting several times during walk. Monitored sats during walk at 89-96%RA. Encouraged pursed-lip breathing. Pt tolerated well. Pt stated he had gotten CHF packet before but gave him another one and reviewed when to call MD. 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 Told him he has to not postpone calling MD with CHF. He knew sodium restriction and fluid restriction and weight gain when asked. Reviewed and gave pt some modified ex ed encouraging him to walk more with frequent rest stops. To recliner with call bell. Wants to go home.   [name], RN BSN  09/05/2014 10:47 AM

## 2014-09-06 ENCOUNTER — Other Ambulatory Visit (HOSPITAL_COMMUNITY): Payer: PRIVATE HEALTH INSURANCE

## 2014-09-09 ENCOUNTER — Encounter (HOSPITAL_COMMUNITY): Payer: Self-pay | Admitting: Emergency Medicine

## 2014-09-09 ENCOUNTER — Inpatient Hospital Stay (HOSPITAL_COMMUNITY): Payer: Medicare Other

## 2014-09-09 ENCOUNTER — Inpatient Hospital Stay (HOSPITAL_COMMUNITY)
Admission: EM | Admit: 2014-09-09 | Discharge: 2014-09-14 | DRG: 193 | Disposition: A | Discharge status: Home / Self Care | Payer: Medicare Other | Attending: Internal Medicine | Admitting: Internal Medicine

## 2014-09-09 ENCOUNTER — Emergency Department (HOSPITAL_COMMUNITY): Payer: Medicare Other

## 2014-09-09 ENCOUNTER — Telehealth: Payer: Self-pay | Admitting: Adult Health

## 2014-09-09 DIAGNOSIS — Z7951 Long term (current) use of inhaled steroids: Secondary | ICD-10-CM

## 2014-09-09 DIAGNOSIS — I739 Peripheral vascular disease, unspecified: Secondary | ICD-10-CM | POA: Diagnosis present

## 2014-09-09 DIAGNOSIS — J9610 Chronic respiratory failure, unspecified whether with hypoxia or hypercapnia: Secondary | ICD-10-CM | POA: Diagnosis present

## 2014-09-09 DIAGNOSIS — Z794 Long term (current) use of insulin: Secondary | ICD-10-CM | POA: Diagnosis not present

## 2014-09-09 DIAGNOSIS — E861 Hypovolemia: Secondary | ICD-10-CM | POA: Diagnosis present

## 2014-09-09 DIAGNOSIS — Y95 Nosocomial condition: Secondary | ICD-10-CM | POA: Diagnosis present

## 2014-09-09 DIAGNOSIS — I779 Disorder of arteries and arterioles, unspecified: Secondary | ICD-10-CM | POA: Diagnosis present

## 2014-09-09 DIAGNOSIS — J441 Chronic obstructive pulmonary disease with (acute) exacerbation: Secondary | ICD-10-CM | POA: Diagnosis present

## 2014-09-09 DIAGNOSIS — I480 Paroxysmal atrial fibrillation: Secondary | ICD-10-CM | POA: Diagnosis not present

## 2014-09-09 DIAGNOSIS — I2510 Atherosclerotic heart disease of native coronary artery without angina pectoris: Secondary | ICD-10-CM | POA: Diagnosis present

## 2014-09-09 DIAGNOSIS — B3781 Candidal esophagitis: Secondary | ICD-10-CM | POA: Diagnosis present

## 2014-09-09 DIAGNOSIS — E11319 Type 2 diabetes mellitus with unspecified diabetic retinopathy without macular edema: Secondary | ICD-10-CM | POA: Diagnosis present

## 2014-09-09 DIAGNOSIS — H109 Unspecified conjunctivitis: Secondary | ICD-10-CM | POA: Diagnosis present

## 2014-09-09 DIAGNOSIS — Z95810 Presence of automatic (implantable) cardiac defibrillator: Secondary | ICD-10-CM | POA: Diagnosis not present

## 2014-09-09 DIAGNOSIS — I130 Hypertensive heart and chronic kidney disease with heart failure and stage 1 through stage 4 chronic kidney disease, or unspecified chronic kidney disease: Secondary | ICD-10-CM | POA: Diagnosis present

## 2014-09-09 DIAGNOSIS — N179 Acute kidney failure, unspecified: Secondary | ICD-10-CM | POA: Diagnosis present

## 2014-09-09 DIAGNOSIS — J449 Chronic obstructive pulmonary disease, unspecified: Secondary | ICD-10-CM

## 2014-09-09 DIAGNOSIS — I340 Nonrheumatic mitral (valve) insufficiency: Secondary | ICD-10-CM

## 2014-09-09 DIAGNOSIS — E038 Other specified hypothyroidism: Secondary | ICD-10-CM | POA: Diagnosis present

## 2014-09-09 DIAGNOSIS — R5383 Other fatigue: Secondary | ICD-10-CM | POA: Diagnosis present

## 2014-09-09 DIAGNOSIS — G9341 Metabolic encephalopathy: Secondary | ICD-10-CM

## 2014-09-09 DIAGNOSIS — E871 Hypo-osmolality and hyponatremia: Secondary | ICD-10-CM | POA: Diagnosis present

## 2014-09-09 DIAGNOSIS — R740 Nonspecific elevation of levels of transaminase and lactic acid dehydrogenase [LDH]: Secondary | ICD-10-CM

## 2014-09-09 DIAGNOSIS — I5023 Acute on chronic systolic (congestive) heart failure: Secondary | ICD-10-CM | POA: Diagnosis present

## 2014-09-09 DIAGNOSIS — N183 Chronic kidney disease, stage 3 unspecified: Secondary | ICD-10-CM | POA: Diagnosis present

## 2014-09-09 DIAGNOSIS — I255 Ischemic cardiomyopathy: Secondary | ICD-10-CM | POA: Diagnosis present

## 2014-09-09 DIAGNOSIS — R509 Fever, unspecified: Secondary | ICD-10-CM | POA: Diagnosis present

## 2014-09-09 DIAGNOSIS — Z951 Presence of aortocoronary bypass graft: Secondary | ICD-10-CM | POA: Diagnosis not present

## 2014-09-09 DIAGNOSIS — Z7901 Long term (current) use of anticoagulants: Secondary | ICD-10-CM

## 2014-09-09 DIAGNOSIS — Z9981 Dependence on supplemental oxygen: Secondary | ICD-10-CM

## 2014-09-09 DIAGNOSIS — N189 Chronic kidney disease, unspecified: Secondary | ICD-10-CM

## 2014-09-09 DIAGNOSIS — I483 Typical atrial flutter: Secondary | ICD-10-CM | POA: Diagnosis not present

## 2014-09-09 DIAGNOSIS — D500 Iron deficiency anemia secondary to blood loss (chronic): Secondary | ICD-10-CM | POA: Diagnosis present

## 2014-09-09 DIAGNOSIS — E039 Hypothyroidism, unspecified: Secondary | ICD-10-CM | POA: Diagnosis present

## 2014-09-09 DIAGNOSIS — I272 Other secondary pulmonary hypertension: Secondary | ICD-10-CM | POA: Diagnosis present

## 2014-09-09 DIAGNOSIS — Z66 Do not resuscitate: Secondary | ICD-10-CM | POA: Diagnosis present

## 2014-09-09 DIAGNOSIS — G4733 Obstructive sleep apnea (adult) (pediatric): Secondary | ICD-10-CM | POA: Diagnosis present

## 2014-09-09 DIAGNOSIS — Z79899 Other long term (current) drug therapy: Secondary | ICD-10-CM | POA: Diagnosis not present

## 2014-09-09 DIAGNOSIS — I4892 Unspecified atrial flutter: Secondary | ICD-10-CM | POA: Diagnosis present

## 2014-09-09 DIAGNOSIS — Z888 Allergy status to other drugs, medicaments and biological substances status: Secondary | ICD-10-CM | POA: Diagnosis not present

## 2014-09-09 DIAGNOSIS — R7401 Elevation of levels of liver transaminase levels: Secondary | ICD-10-CM | POA: Diagnosis present

## 2014-09-09 DIAGNOSIS — Z87891 Personal history of nicotine dependence: Secondary | ICD-10-CM | POA: Diagnosis not present

## 2014-09-09 DIAGNOSIS — E1165 Type 2 diabetes mellitus with hyperglycemia: Secondary | ICD-10-CM | POA: Diagnosis present

## 2014-09-09 DIAGNOSIS — D649 Anemia, unspecified: Secondary | ICD-10-CM | POA: Diagnosis present

## 2014-09-09 DIAGNOSIS — I5022 Chronic systolic (congestive) heart failure: Secondary | ICD-10-CM

## 2014-09-09 DIAGNOSIS — A419 Sepsis, unspecified organism: Secondary | ICD-10-CM

## 2014-09-09 DIAGNOSIS — J189 Pneumonia, unspecified organism: Principal | ICD-10-CM | POA: Diagnosis present

## 2014-09-09 DIAGNOSIS — E119 Type 2 diabetes mellitus without complications: Secondary | ICD-10-CM | POA: Diagnosis present

## 2014-09-09 DIAGNOSIS — D638 Anemia in other chronic diseases classified elsewhere: Secondary | ICD-10-CM | POA: Diagnosis present

## 2014-09-09 DIAGNOSIS — E1159 Type 2 diabetes mellitus with other circulatory complications: Secondary | ICD-10-CM | POA: Diagnosis present

## 2014-09-09 LAB — BLOOD GAS, ARTERIAL
ACID-BASE EXCESS: 2.5 mmol/L — AB (ref 0.0–2.0)
Bicarbonate: 26 mEq/L — ABNORMAL HIGH (ref 20.0–24.0)
DRAWN BY: 307971
O2 Content: 2 L/min
O2 Saturation: 98.5 %
PATIENT TEMPERATURE: 98.6
PCO2 ART: 37 mmHg (ref 35.0–45.0)
TCO2: 27.2 mmol/L (ref 0–100)
pH, Arterial: 7.461 — ABNORMAL HIGH (ref 7.350–7.450)
pO2, Arterial: 119 mmHg — ABNORMAL HIGH (ref 80.0–100.0)

## 2014-09-09 LAB — HEPATITIS PANEL, ACUTE
HCV Ab: NEGATIVE
HEP B S AG: NEGATIVE
Hep A IgM: NONREACTIVE
Hep B C IgM: NONREACTIVE

## 2014-09-09 LAB — CBC WITH DIFFERENTIAL/PLATELET
BASOS ABS: 0 10*3/uL (ref 0.0–0.1)
BASOS PCT: 0 % (ref 0–1)
EOS ABS: 0.1 10*3/uL (ref 0.0–0.7)
Eosinophils Relative: 1 % (ref 0–5)
HCT: 29.7 % — ABNORMAL LOW (ref 39.0–52.0)
HEMOGLOBIN: 9.8 g/dL — AB (ref 13.0–17.0)
LYMPHS ABS: 1.2 10*3/uL (ref 0.7–4.0)
LYMPHS PCT: 10 % — AB (ref 12–46)
MCH: 29.2 pg (ref 26.0–34.0)
MCHC: 33 g/dL (ref 30.0–36.0)
MCV: 88.4 fL (ref 78.0–100.0)
MONOS PCT: 11 % (ref 3–12)
Monocytes Absolute: 1.3 10*3/uL — ABNORMAL HIGH (ref 0.1–1.0)
Neutro Abs: 9.3 10*3/uL — ABNORMAL HIGH (ref 1.7–7.7)
Neutrophils Relative %: 78 % — ABNORMAL HIGH (ref 43–77)
PLATELETS: 142 10*3/uL — AB (ref 150–400)
RBC: 3.36 MIL/uL — ABNORMAL LOW (ref 4.22–5.81)
RDW: 19.2 % — ABNORMAL HIGH (ref 11.5–15.5)
WBC: 11.9 10*3/uL — ABNORMAL HIGH (ref 4.0–10.5)

## 2014-09-09 LAB — URINALYSIS, ROUTINE W REFLEX MICROSCOPIC
Bilirubin Urine: NEGATIVE
Glucose, UA: NEGATIVE mg/dL
Hgb urine dipstick: NEGATIVE
KETONES UR: NEGATIVE mg/dL
LEUKOCYTES UA: NEGATIVE
Nitrite: NEGATIVE
PH: 5.5 (ref 5.0–8.0)
Protein, ur: NEGATIVE mg/dL
Specific Gravity, Urine: 1.012 (ref 1.005–1.030)
Urobilinogen, UA: 1 mg/dL (ref 0.0–1.0)

## 2014-09-09 LAB — CBC
HCT: 33.5 % — ABNORMAL LOW (ref 39.0–52.0)
Hemoglobin: 10.8 g/dL — ABNORMAL LOW (ref 13.0–17.0)
MCH: 28.8 pg (ref 26.0–34.0)
MCHC: 32.2 g/dL (ref 30.0–36.0)
MCV: 89.3 fL (ref 78.0–100.0)
Platelets: 160 10*3/uL (ref 150–400)
RBC: 3.75 MIL/uL — ABNORMAL LOW (ref 4.22–5.81)
RDW: 19.2 % — ABNORMAL HIGH (ref 11.5–15.5)
WBC: 12.6 10*3/uL — ABNORMAL HIGH (ref 4.0–10.5)

## 2014-09-09 LAB — INFLUENZA PANEL BY PCR (TYPE A & B)
H1N1 flu by pcr: NOT DETECTED
INFLBPCR: NEGATIVE
Influenza A By PCR: NEGATIVE

## 2014-09-09 LAB — COMPREHENSIVE METABOLIC PANEL
ALBUMIN: 1.9 g/dL — AB (ref 3.5–5.2)
ALK PHOS: 101 U/L (ref 39–117)
ALT: 25 U/L (ref 0–53)
ALT: 21 U/L (ref 0–53)
AST: 65 U/L — ABNORMAL HIGH (ref 0–37)
AST: 51 U/L — ABNORMAL HIGH (ref 0–37)
Albumin: 2.3 g/dL — ABNORMAL LOW (ref 3.5–5.2)
Alkaline Phosphatase: 124 U/L — ABNORMAL HIGH (ref 39–117)
Anion gap: 12 (ref 5–15)
Anion gap: 10 (ref 5–15)
BILIRUBIN TOTAL: 2.6 mg/dL — AB (ref 0.3–1.2)
BUN: 27 mg/dL — ABNORMAL HIGH (ref 6–23)
BUN: 27 mg/dL — ABNORMAL HIGH (ref 6–23)
CALCIUM: 7.8 mg/dL — AB (ref 8.4–10.5)
CALCIUM: 7.5 mg/dL — AB (ref 8.4–10.5)
CO2: 25 mmol/L (ref 19–32)
CO2: 25 mmol/L (ref 19–32)
CREATININE: 2.05 mg/dL — AB (ref 0.50–1.35)
Chloride: 92 mmol/L — ABNORMAL LOW (ref 96–112)
Chloride: 94 mmol/L — ABNORMAL LOW (ref 96–112)
Creatinine, Ser: 1.99 mg/dL — ABNORMAL HIGH (ref 0.50–1.35)
GFR calc Af Amer: 36 mL/min — ABNORMAL LOW (ref >90)
GFR calc non Af Amer: 30 mL/min — ABNORMAL LOW (ref >90)
GFR, EST AFRICAN AMERICAN: 34 mL/min — AB (ref >90)
GFR, EST NON AFRICAN AMERICAN: 31 mL/min — AB (ref >90)
GLUCOSE: 120 mg/dL — AB (ref 70–99)
Glucose, Bld: 91 mg/dL (ref 70–99)
Potassium: 3.6 mmol/L (ref 3.5–5.1)
Potassium: 3.5 mmol/L (ref 3.5–5.1)
Sodium: 129 mmol/L — ABNORMAL LOW (ref 135–145)
Sodium: 129 mmol/L — ABNORMAL LOW (ref 135–145)
TOTAL PROTEIN: 6.4 g/dL (ref 6.0–8.3)
Total Bilirubin: 2.8 mg/dL — ABNORMAL HIGH (ref 0.3–1.2)
Total Protein: 5.4 g/dL — ABNORMAL LOW (ref 6.0–8.3)

## 2014-09-09 LAB — I-STAT TROPONIN, ED: TROPONIN I, POC: 0.03 ng/mL (ref 0.00–0.08)

## 2014-09-09 LAB — PROTIME-INR
INR: 1.66 — AB (ref 0.00–1.49)
PROTHROMBIN TIME: 19.8 s — AB (ref 11.6–15.2)

## 2014-09-09 LAB — HEPARIN LEVEL (UNFRACTIONATED): HEPARIN UNFRACTIONATED: 1.66 [IU]/mL — AB (ref 0.30–0.70)

## 2014-09-09 LAB — GLUCOSE, CAPILLARY
GLUCOSE-CAPILLARY: 118 mg/dL — AB (ref 70–99)
Glucose-Capillary: 83 mg/dL (ref 70–99)
Glucose-Capillary: 96 mg/dL (ref 70–99)

## 2014-09-09 LAB — LACTIC ACID, PLASMA
LACTIC ACID, VENOUS: 1.1 mmol/L (ref 0.5–2.0)
LACTIC ACID, VENOUS: 1.1 mmol/L (ref 0.5–2.0)

## 2014-09-09 LAB — SODIUM, URINE, RANDOM: Sodium, Ur: 18 mmol/L

## 2014-09-09 LAB — MRSA PCR SCREENING: MRSA by PCR: NEGATIVE

## 2014-09-09 LAB — PROCALCITONIN: Procalcitonin: 1.19 ng/mL

## 2014-09-09 LAB — OSMOLALITY, URINE: OSMOLALITY UR: 346 mosm/kg — AB (ref 390–1090)

## 2014-09-09 LAB — CBG MONITORING, ED: Glucose-Capillary: 125 mg/dL — ABNORMAL HIGH (ref 70–99)

## 2014-09-09 LAB — AMMONIA: Ammonia: 49 umol/L — ABNORMAL HIGH (ref 11–32)

## 2014-09-09 LAB — APTT: APTT: 47 s — AB (ref 24–37)

## 2014-09-09 LAB — BRAIN NATRIURETIC PEPTIDE: B NATRIURETIC PEPTIDE 5: 550.7 pg/mL — AB (ref 0.0–100.0)

## 2014-09-09 LAB — I-STAT CG4 LACTIC ACID, ED: Lactic Acid, Venous: 1.66 mmol/L (ref 0.5–2.0)

## 2014-09-09 MED ORDER — ACETAMINOPHEN 650 MG RE SUPP: 650.0000 mg | RECTAL | Status: DC | PRN

## 2014-09-09 MED ORDER — INSULIN ASPART 100 UNIT/ML ~~LOC~~ SOLN
0.0000 [IU] | SUBCUTANEOUS | Status: DC
  Administered 2014-09-10 (×2): 1 [IU] via SUBCUTANEOUS

## 2014-09-09 MED ORDER — SODIUM CHLORIDE 0.9 % IV BOLUS (SEPSIS)
500.0000 mL | Freq: Once | INTRAVENOUS | Status: AC
  Administered 2014-09-09: 500 mL via INTRAVENOUS

## 2014-09-09 MED ORDER — TOBRAMYCIN 0.3 % OP SOLN
2.0000 [drp] | Freq: Four times a day (QID) | OPHTHALMIC | Status: DC
  Administered 2014-09-10 – 2014-09-14 (×17): 2 [drp] via OPHTHALMIC
  Filled 2014-09-09 (×2): qty 5

## 2014-09-09 MED ORDER — HEPARIN (PORCINE) IN NACL 100-0.45 UNIT/ML-% IJ SOLN
1200.0000 [IU]/h | INTRAMUSCULAR | Status: DC
  Administered 2014-09-09: 1000 [IU]/h via INTRAVENOUS
  Filled 2014-09-09: qty 250

## 2014-09-09 MED ORDER — ONDANSETRON HCL 4 MG/2ML IJ SOLN: 4.0000 mg | Freq: Four times a day (QID) | INTRAMUSCULAR | Status: DC | PRN

## 2014-09-09 MED ORDER — SODIUM CHLORIDE 0.9 % IV SOLN: 250.0000 mL | INTRAVENOUS | Status: DC | PRN

## 2014-09-09 MED ORDER — LEVOTHYROXINE SODIUM 100 MCG IV SOLR
37.5000 ug | Freq: Every day | INTRAVENOUS | Status: DC
  Administered 2014-09-09 – 2014-09-10 (×2): 37.5 ug via INTRAVENOUS
  Filled 2014-09-09 (×2): qty 5

## 2014-09-09 MED ORDER — PIPERACILLIN-TAZOBACTAM 3.375 G IVPB 30 MIN
3.3750 g | Freq: Once | INTRAVENOUS | Status: AC
  Administered 2014-09-09: 3.375 g via INTRAVENOUS
  Filled 2014-09-09: qty 50

## 2014-09-09 MED ORDER — FLUCONAZOLE 100MG IVPB
100.0000 mg | INTRAVENOUS | Status: DC
  Administered 2014-09-09 – 2014-09-12 (×4): 100 mg via INTRAVENOUS
  Filled 2014-09-09 (×5): qty 50

## 2014-09-09 MED ORDER — ACETAMINOPHEN 500 MG PO TABS
1000.0000 mg | ORAL_TABLET | Freq: Once | ORAL | Status: AC
  Administered 2014-09-09: 1000 mg via ORAL
  Filled 2014-09-09: qty 2

## 2014-09-09 MED ORDER — ONDANSETRON HCL 4 MG PO TABS: 4.0000 mg | ORAL_TABLET | Freq: Four times a day (QID) | ORAL | Status: DC | PRN

## 2014-09-09 MED ORDER — VANCOMYCIN HCL IN DEXTROSE 750-5 MG/150ML-% IV SOLN
750.0000 mg | INTRAVENOUS | Status: DC
  Administered 2014-09-10 – 2014-09-12 (×3): 750 mg via INTRAVENOUS
  Filled 2014-09-09 (×4): qty 150

## 2014-09-09 MED ORDER — SODIUM CHLORIDE 0.9 % IJ SOLN
3.0000 mL | Freq: Two times a day (BID) | INTRAMUSCULAR | Status: DC
  Administered 2014-09-09 – 2014-09-10 (×2): 3 mL via INTRAVENOUS

## 2014-09-09 MED ORDER — THIAMINE HCL 100 MG/ML IJ SOLN
100.0000 mg | Freq: Every day | INTRAMUSCULAR | Status: DC
  Administered 2014-09-09 – 2014-09-10 (×2): 100 mg via INTRAVENOUS
  Filled 2014-09-09 (×2): qty 1

## 2014-09-09 MED ORDER — VANCOMYCIN HCL IN DEXTROSE 1-5 GM/200ML-% IV SOLN
1000.0000 mg | Freq: Once | INTRAVENOUS | Status: AC
  Administered 2014-09-09: 1000 mg via INTRAVENOUS
  Filled 2014-09-09: qty 200

## 2014-09-09 MED ORDER — SODIUM CHLORIDE 0.9 % IJ SOLN: 3.0000 mL | INTRAMUSCULAR | Status: DC | PRN

## 2014-09-09 MED ORDER — LACTULOSE ENEMA
300.0000 mL | Freq: Once | ORAL | Status: AC
  Administered 2014-09-09: 300 mL via RECTAL
  Filled 2014-09-09: qty 300

## 2014-09-09 MED ORDER — FOLIC ACID 5 MG/ML IJ SOLN
1.0000 mg | Freq: Every day | INTRAMUSCULAR | Status: DC
  Administered 2014-09-09 – 2014-09-10 (×2): 1 mg via INTRAVENOUS
  Filled 2014-09-09 (×2): qty 0.2

## 2014-09-09 MED ORDER — PIPERACILLIN-TAZOBACTAM 3.375 G IVPB
3.3750 g | Freq: Three times a day (TID) | INTRAVENOUS | Status: DC
  Administered 2014-09-09 – 2014-09-13 (×11): 3.375 g via INTRAVENOUS
  Filled 2014-09-09 (×13): qty 50

## 2014-09-09 NOTE — ED Notes (Signed)
Patient refused I&O cath; given another Sprite.

## 2014-09-09 NOTE — ED Provider Notes (Signed)
CSN: [id number]     Arrival date & time 09/09/14  1118 History   First MD Initiated Contact with Patient 09/09/14 1118     Chief Complaint  Patient presents with  . Fatigue  . Fever     (Consider location/radiation/quality/duration/timing/severity/associated sxs/prior Treatment) HPI Comments: Recent admission for CHF - had aggressive diuresis - 12 pounds - also had melena requiring 2 units of PRBCs.  Patient is a 79 y.o. male presenting with fever and weakness. The history is provided by the patient.  Fever Severity:  Moderate Onset quality:  Gradual Timing:  Constant Progression:  Unchanged Chronicity:  New Relieved by:  Nothing Worsened by:  Nothing tried Associated symptoms: chills   Associated symptoms: no chest pain, no cough, no diarrhea, no dysuria, no nausea and no vomiting   Weakness This is a new problem. The current episode started 2 days ago. The problem occurs constantly. The problem has not changed since onset.Pertinent negatives include no chest pain, no abdominal pain and no shortness of breath. Nothing aggravates the symptoms. Nothing relieves the symptoms.    Past Medical History  Diagnosis Date  . VENTRICULAR TACHYCARDIA   . PERIPHERAL VASCULAR DISEASE   . HYPERTENSION     Hypertensive retinopathy-grade II OU  . HYPERCHOLESTEROLEMIA   . CORONARY ARTERY DISEASE     Hx of CABG  . Chronic systolic heart failure     Ischemic cardiomyopathy (01/2014 EF 40-45% with hypokinesis + small area of akinesis  . SEBORRHEIC DERMATITIS   . Chronic renal insufficiency, stage III (moderate)     CrCl 40s-50s  . DIVERTICULOSIS OF COLON   . COPD, severe   . COLONIC POLYPS   . ANXIETY   . ICD (implantable cardiac defibrillator) in place   . Type II diabetes mellitus     +background diabetic retinopathy OU  . Chronic lower back pain   . DJD (degenerative joint disease)     "hands" (05/24/2012)  . Osteoarthritis of finger   . History of atrial flutter   . Thrombus  12/2013    on ICD lead--started anticoag and his cardioversion for atrial flutter was postponed.  . Colon stricture 2015    with features worrisome for mass, also with recent rising CEA level (possible colon cancer)-GI MD= Dr. Franki Cabot recent eval/follow up with Dr. Earlean Shawl was summer 2014, at which time he recommended colonoscopy but pt declined.  . Macular degeneration, age related, nonexudative     OU   Past Surgical History  Procedure Laterality Date  . Coronary artery bypass graft  1990's    CABG X4  . Tonsillectomy  ?1942  . Cardiac defibrillator placement  12/2004    single chamber defibrillator- [employer]onic Maxima 8/06 DrKlein [Other][  . Tee without cardioversion N/A 12/22/2013    Procedure: TRANSESOPHAGEAL ECHOCARDIOGRAM (TEE);  Surgeon: Thayer Headings, MD;  Location: [facility];  Service: Cardiovascular;  Laterality: N/A;  . Cardioversion N/A 12/22/2013    Procedure: CARDIOVERSION;  Surgeon: Thayer Headings, MD;  Location: [facility] ENDOSCOPY;  Service: Cardiovascular;  Laterality: N/A;  . Cardiovascular stress test  01/12/13    myocard perf imaging: previous infarct with a moderately reduced EF as was known previously.  No new findings.   . Gastric emptying scan  02/02/14    Normal  . Transthoracic echocardiogram  01/25/14; 04/2014    EF 40-45%, diffuse hypokinesis, infero-basilar myocardial akinesis, PA pressure slightly high, valves fine.04/2014-Echo today EF ~40% inferior AK moderate to severe MR  . Abdominal ultrasound  01/2014    GB sludge, o/w normal  . Ct virtual colonoscopy diagnostic  11/2012    Asymmetric thickening of the rectosigmoid colon worrisome for  . Cardiovascular stress test      Lexiscan: There is a medium sized fixed inferior wall defect of moderate severity involving the apical inferior and mid inferior and basal inferoseptal segments, consistent with old inferior MI. No significant reversible ischemia.  EF 34%, inferior wall hypokinesis--intermediate risk scan (ok  to do planned procedure)  . Implantable cardioverter defibrillator revision N/A 05/25/2012    Procedure: IMPLANTABLE CARDIOVERTER DEFIBRILLATOR REVISION;  Surgeon: Evans Lance, MD;  Location: [facility] CATH LAB;  Service: Cardiovascular;  Laterality: N/A;  . Right heart catheterization N/A 08/31/2014    Procedure: RIGHT HEART CATH;  Surgeon: Larey Dresser, MD;  Location: [name] CATH LAB;  Service: Cardiovascular;  Laterality: N/A;  . Esophagogastroduodenoscopy N/A 09/03/2014    Procedure: ESOPHAGOGASTRODUODENOSCOPY (EGD);  Surgeon: Carol Ada, MD;  Location: [facility] ENDOSCOPY;  Service: Endoscopy;  Laterality: N/A;   Family History  Problem Relation Age of Onset  . Heart attack Father 10  . Heart attack Brother     Vague history   History  Substance Use Topics  . Smoking status: Former Smoker -- 2.00 packs/day for 50 years    Types: Cigarettes    Quit date: 06/24/2000  . Smokeless tobacco: Never Used  . Alcohol Use: 0.0 oz/week    0 Standard drinks or equivalent per week     Comment: 05/24/2012 "used to drink alot; quit > 10 yr ago"    Review of Systems  Constitutional: Positive for fever and chills.  Respiratory: Negative for cough and shortness of breath.   Cardiovascular: Negative for chest pain and leg swelling.  Gastrointestinal: Negative for nausea, vomiting, abdominal pain and diarrhea.  Genitourinary: Negative for dysuria.  Neurological: Positive for weakness.  All other systems reviewed and are negative.     Allergies  Niacin  Home Medications   Prior to Admission medications   Medication Sig Start Date End Date Taking? Authorizing Provider  amiodarone (PACERONE) 200 MG tablet Take 1 tablet (200 mg total) by mouth daily. 02/09/14   Amy D Ninfa Meeker, NP  apixaban (ELIQUIS) 5 MG TABS tablet Take 0.5 tablets (2.5 mg total) by mouth 2 (two) times daily. 09/05/14   Amy D Clegg, NP  fluconazole (DIFLUCAN) 100 MG tablet Take 1 tablet (100 mg total) by mouth daily. 09/05/14   Amy D Clegg,  NP  Fluticasone-Salmeterol (ADVAIR) 500-50 MCG/DOSE AEPB Inhale 1 puff into the lungs 2 (two) times daily. 04/05/14   Noralee Space, MD  hydrALAZINE (APRESOLINE) 25 MG tablet Take 0.5 tablets (12.5 mg total) by mouth 3 (three) times daily. 09/05/14   Amy D Ninfa Meeker, NP  Insulin Glargine (LANTUS SOLOSTAR) 100 UNIT/ML Solostar Pen 10 units SQ qhs Patient taking differently: Inject 10 Units into the skin at bedtime.  02/10/14   Tammi Sou, MD  insulin lispro (HUMALOG KWIKPEN) 100 UNIT/ML KiwkPen 7 U SQ w/BF, 8 U SQ w/Lunch, and 9 U SQ w/Supper Patient taking differently: Inject 7-9 Units into the skin 3 (three) times daily. 7 U SQ w/BF, 8 U SQ w/Lunch, and 9 U SQ w/Supper 08/02/14   Tammi Sou, MD  isosorbide mononitrate (IMDUR) 30 MG 24 hr tablet Take 1 tablet (30 mg total) by mouth daily. 02/03/14   Amy D Clegg, NP  levothyroxine (SYNTHROID, LEVOTHROID) 75 MCG tablet Take 1 tablet (75 mcg total) by mouth  daily. 05/12/14   Tammi Sou, MD  metolazone (ZAROXOLYN) 2.5 MG tablet Take 1 tablet (2.5 mg total) by mouth as needed. 09/05/14   Amy D Clegg, NP  pantoprazole (PROTONIX) 40 MG tablet Take 1 tablet (40 mg total) by mouth 2 (two) times daily. 09/05/14   Amy D Clegg, NP  potassium chloride (K-DUR) 10 MEQ tablet Take 2 tablets (20 mEq total) by mouth 2 (two) times daily. 09/05/14   Amy D Ninfa Meeker, NP  pravastatin (PRAVACHOL) 80 MG tablet Take 1 tablet (80 mg total) by mouth daily. Patient taking differently: Take 80 mg by mouth at bedtime.  03/06/14   Minus Breeding, MD  tiotropium (SPIRIVA) 18 MCG inhalation capsule Place 18 mcg into inhaler and inhale every evening.     Historical Provider, MD  torsemide (DEMADEX) 20 MG tablet Take 2 tablets (40 mg total) by mouth daily. 09/05/14   Amy D Clegg, NP   BP 113/52 mmHg  Pulse 74  Temp(Src) 101.4 F (38.6 C) (Rectal)  Resp 18  SpO2 97% Physical Exam  Constitutional: He is oriented to person, place, and time. He appears well-developed and  well-nourished. No distress.  HENT:  Head: Normocephalic and atraumatic.  Mouth/Throat: No oropharyngeal exudate.  Eyes: EOM are normal. Pupils are equal, round, and reactive to light.  Neck: Normal range of motion. Neck supple.  Cardiovascular: Normal rate and regular rhythm.  Exam reveals no friction rub.   No murmur heard. Pulmonary/Chest: Effort normal and breath sounds normal. No respiratory distress. He has no wheezes. He has no rales.  Abdominal: He exhibits no distension. There is no tenderness. There is no rebound.  Musculoskeletal: Normal range of motion. He exhibits no edema.  Neurological: He is alert and oriented to person, place, and time.  Skin: He is not diaphoretic.  Nursing note and vitals reviewed.   ED Course  Procedures (including critical care time) Labs Review Labs Reviewed  CULTURE, BLOOD (ROUTINE X 2)  CULTURE, BLOOD (ROUTINE X 2)  URINE CULTURE  CBC  COMPREHENSIVE METABOLIC PANEL  URINALYSIS, ROUTINE W REFLEX MICROSCOPIC  BRAIN NATRIURETIC PEPTIDE  I-STAT CG4 LACTIC ACID, ED  Randolm Idol, ED    Imaging Review Dg Chest 2 View  09/10/2014   CLINICAL DATA:  Fever of unknown origin.  EXAM: CHEST  2 VIEW  COMPARISON:  09/09/2014  FINDINGS: The cardiac silhouette, mediastinal and hilar contours are stable. Stable surgical changes from bypass surgery. The pacer wires/ AICD are stable. There are chronic lung changes with calcified pleural plaques bilaterally. Increasing airspace opacity in the right upper lobe is suspicious for pneumonia. No pleural effusions.  IMPRESSION: Chronic lung changes with calcified pleural plaques.  Slightly progressive airspace opacity in the right upper lobe suspicious for pneumonia.   Electronically Signed   By: Marijo Sanes M.D.   On: 09/10/2014 07:51   Dg Chest 2 View  09/09/2014   CLINICAL DATA:  Pt states weakness x 2 days. Denies any CP, SOB, cough, fever, or dizziness. H/o quadrupl bypass surgery 20+ years ago,  defibrillator placed in 2006, h/o COPD, diabetes  EXAM: CHEST  2 VIEW  COMPARISON:  08/31/2014  FINDINGS: Changes from CABG surgery are stable. Cardiac silhouette is mildly enlarged. No mediastinal or hilar masses. Left anterior chest wall AICD is stable all position.  Lungs are hyperexpanded. There are mildly thickened interstitial markings bilaterally. No lung consolidation or edema. No pleural effusion or pneumothorax.  Bony thorax is demineralized but grossly intact.  IMPRESSION: No acute  cardiopulmonary disease.   Electronically Signed   By: Lajean Manes M.D.   On: 09/09/2014 12:43   Dg Chest Port 1 View  09/11/2014   CLINICAL DATA:  CHF, pneumonia, history of CABG, chronic lung disease  EXAM: PORTABLE CHEST - 1 VIEW  COMPARISON:  tion PA and lateral chest of September 10, 2014  FINDINGS: The lungs remain hyperinflated. The interstitial markings remain coarse bilaterally. Confluent density in the right upper lobe persists but has not progressed significantly since the previous study. Pleural plaques are present bilaterally. There is no significant pleural effusion, and there is no pneumothorax. The cardiac silhouette is enlarged. The permanent pacemaker defibrillator is unchanged in position. The pulmonary vascularity is not engorged. There are 8 intact sternal wires.  IMPRESSION: Airspace opacity in the right upper lobe suggests pneumonia. There is underlying chronic interstitial lung disease with bilateral calcified pleural plaques. There is no evidence of CHF currently.   Electronically Signed   By: David  Martinique   On: 09/11/2014 07:18   Dg Chest Port 1 View  09/09/2014   CLINICAL DATA:  Patient with acute onset fever.  Sepsis.  EXAM: PORTABLE CHEST - 1 VIEW  COMPARISON:  Chest radiograph 09/09/2014  FINDINGS: Multi lead AICD device overlies the left hemi thorax, leads appear stable in position. Stable enlarged cardiac and mediastinal contours status post median sternotomy and CABG procedure.  Heterogeneous opacities are demonstrated throughout the left mid lung and right mid lung. No definite pleural effusion or pneumothorax. Calcified pleural plaques.  IMPRESSION: Bilateral left-greater-than-right mid lung heterogeneous opacities may represent infection in the appropriate clinical setting. A portion of these opacities may be secondary to calcified pleural plaques. Recommend continued radiographic followup.   Electronically Signed   By: Lovey Newcomer M.D.   On: 09/09/2014 16:58     EKG Interpretation   Date/Time:  Saturday September 09 2014 11:22:49 EDT Ventricular Rate:  73 PR Interval:  198 QRS Duration: 154 QT Interval:  455 QTC Calculation: 501 R Axis:   105 Text Interpretation:  Sinus rhythm Nonspecific intraventricular conduction  delay Minimal ST depression, inferior leads Baseline wander in lead(s) I  III aVL aVF V1 V5 No significant change since last tracing Confirmed by  Mingo Amber  MD, [place] Diaz ([id number]) on 09/09/2014 11:29:06 AM      MDM   Final diagnoses:  Fever    79 year old male here with fever and weakness. Just discharged from the hospital after a CHF exacerbation. He had 12 pounds of diuresis. He also had 2 units PRBCs for some melena. EGD shows esophagus but no bleeding. Here febrile to 101.4. Normotensive. Lungs clear, will give small fluid bolus. Cultures obtained, labs ordered. He denies SOB, CP, N/V/D, dysuria. No rash.  Labs with elevated white count. Admitted.  Evelina Bucy, MD 09/11/14 [phone]

## 2014-09-09 NOTE — ED Notes (Signed)
Mardene Celeste (wife) would like to be notified when patient is situated upstairs  [phone]

## 2014-09-09 NOTE — ED Notes (Signed)
Patient reports feeling tired and weak; recently admitted for CHF exacerbation. Denies feeling better on discharge and has not improved since. Denies cough, congestion, SOB. Denies N/V/D. Denies urinary symptoms. Denies pain.

## 2014-09-09 NOTE — ED Notes (Signed)
Patient returned from XRay

## 2014-09-09 NOTE — Consult Note (Signed)
Reason for Consult: Severe cardiomyopathy, sepsis  Requesting Physician: Verlon Au  Cardiologist: Hochrein  HPI: Consulted for management of chronic heart failure with recent acute exacerbation in the setting of acute febrile illness with hypotension/sepsis. Temperature >101 deg F, BP 92/40, transient brownish sputum, litlessness, but no change in chronc dyspnea or recent chest pain. Na 129. BNP at/below baseline.  This is a 79 y.o. male with a past medical history significant for CAD s/p CABG, ischemic cardiomyopathy, s/p Medtronic ICD, chronic systolic heart failure, DM2, Vtach, paroxysmal atrial flutter, PVD, COPD with chronic respiratory failure on home O2 2L/minrecent GI bleeding and anemia requiring transfusion, esophageal candidiasis, CKD stage 3 (baseline creatinine 2.4).  ECHO (12/22/13) EF 20-25%, improved EF 40% (12/15 and 08/31/2014), inferior akinesis, Mod-severe MR, dilated RV  Right heart cath 08/31/2014 - before milrinone/norepi assisted diuresis Hemodynamics (mmHg) RA mean 14 RV 76/9/18 PA 72/17, mean 37 PCWP mean 20 with prominent V-waves  Oxygen saturations: PA 61% AO 97%  Cardiac Output (Fick) 5.88  Cardiac Index (Fick) 3.16 PVR 2.9 Elevated RVEDP and RA pressure, CVP/PCWP = 0.7 suggests RV > LV failure.   PMHx:  Past Medical History  Diagnosis Date  . VENTRICULAR TACHYCARDIA   . PERIPHERAL VASCULAR DISEASE   . HYPERTENSION     Hypertensive retinopathy-grade II OU  . HYPERCHOLESTEROLEMIA   . CORONARY ARTERY DISEASE     Hx of CABG  . Chronic systolic heart failure     Ischemic cardiomyopathy (01/2014 EF 40-45% with hypokinesis + small area of akinesis  . SEBORRHEIC DERMATITIS   . Chronic renal insufficiency, stage III (moderate)     CrCl 40s-50s  . DIVERTICULOSIS OF COLON   . COPD, severe   . COLONIC POLYPS   . ANXIETY   . ICD (implantable cardiac defibrillator) in place   . Type II diabetes mellitus     +background diabetic  retinopathy OU  . Chronic lower back pain   . DJD (degenerative joint disease)     "hands" (05/24/2012)  . Osteoarthritis of finger   . History of atrial flutter   . Thrombus 12/2013    on ICD lead--started anticoag and his cardioversion for atrial flutter was postponed.  . Colon stricture 2015    with features worrisome for mass, also with recent rising CEA level (possible colon cancer)-GI MD= Dr. Franki Cabot recent eval/follow up with Dr. Earlean Shawl was summer 2014, at which time he recommended colonoscopy but pt declined.  . Macular degeneration, age related, nonexudative     OU   Past Surgical History  Procedure Laterality Date  . Coronary artery bypass graft  1990's    CABG X4  . Tonsillectomy  ?1942  . Cardiac defibrillator placement  12/2004    single chamber defibrillator- Medtronic Maxima 8/06 DrKlein [Other][  . Tee without cardioversion N/A 12/22/2013    Procedure: TRANSESOPHAGEAL ECHOCARDIOGRAM (TEE);  Surgeon: Thayer Headings, MD;  Location: [place];  Service: Cardiovascular;  Laterality: N/A;  . Cardioversion N/A 12/22/2013    Procedure: CARDIOVERSION;  Surgeon: Thayer Headings, MD;  Location: [facility] ENDOSCOPY;  Service: Cardiovascular;  Laterality: N/A;  . Cardiovascular stress test  01/12/13    myocard perf imaging: previous infarct with a moderately reduced EF as was known previously.  No new findings.   . Gastric emptying scan  02/02/14    Normal  . Transthoracic echocardiogram  01/25/14; 04/2014    EF 40-45%, diffuse hypokinesis, infero-basilar myocardial akinesis, PA pressure slightly high, valves fine.04/2014-Echo today EF ~  40% inferior AK moderate to severe MR  . Abdominal ultrasound  01/2014    GB sludge, o/w normal  . Ct virtual colonoscopy diagnostic  11/2012    Asymmetric thickening of the rectosigmoid colon worrisome for  . Cardiovascular stress test      Lexiscan: There is a medium sized fixed inferior wall defect of moderate severity involving the apical inferior  and mid inferior and basal inferoseptal segments, consistent with old inferior MI. No significant reversible ischemia.  EF 34%, inferior wall hypokinesis--intermediate risk scan (ok to do planned procedure)  . Implantable cardioverter defibrillator revision N/A 05/25/2012    Procedure: IMPLANTABLE CARDIOVERTER DEFIBRILLATOR REVISION;  Surgeon: Evans Lance, MD;  Location: [facility] CATH LAB;  Service: Cardiovascular;  Laterality: N/A;  . Right heart catheterization N/A 08/31/2014    Procedure: RIGHT HEART CATH;  Surgeon: Larey Dresser, MD;  Location: [facility] CATH LAB;  Service: Cardiovascular;  Laterality: N/A;  . Esophagogastroduodenoscopy N/A 09/03/2014    Procedure: ESOPHAGOGASTRODUODENOSCOPY (EGD);  Surgeon: Carol Ada, MD;  Location: [facility] ENDOSCOPY;  Service: Endoscopy;  Laterality: N/A;    FAMHx: Family History  Problem Relation Age of Onset  . Heart attack Father 63  . Heart attack Brother     Vague history    SOCHx:  reports that he quit smoking about 14 years ago. His smoking use included Cigarettes. He has a 100 pack-year smoking history. He has never used smokeless tobacco. He reports that he drinks alcohol. He reports that he does not use illicit drugs.  ALLERGIES: Allergies  Allergen Reactions  . Niacin Itching    Niaspan     ROS: Pertinent items are noted in HPI.  No Headache, changes with Vision or hearing, new weakness, tingling, numbness in any extremity, noted bilateral conjunctivitis No problems swallowing food or Liquids, indigestion/reflux-reports anorexia since discharge from hospital No Chest pain, Shortness of Breath, palpitations, orthopnea or DOE No Abdominal pain, N/V; no melena or hematochezia, no dark tarry stools, Bowel movements are regular, No dysuria, hematuria or flank pain No lesions, masses or bruises, No new joints pains-aches No polyuria, polydypsia or polyphagia,  HOSPITAL MEDICATIONS: Prior to Admission:  Prescriptions prior to admission    Medication Sig Dispense Refill Last Dose  . amiodarone (PACERONE) 200 MG tablet Take 1 tablet (200 mg total) by mouth daily. 30 tablet 6 09/08/2014 at Unknown time  . apixaban (ELIQUIS) 5 MG TABS tablet Take 0.5 tablets (2.5 mg total) by mouth 2 (two) times daily. 60 tablet 6 09/08/2014 at Unknown time  . carvedilol (COREG) 6.25 MG tablet Take 6.25 mg by mouth 2 (two) times daily with a meal.    09/08/2014 at 2000  . Fluticasone-Salmeterol (ADVAIR) 500-50 MCG/DOSE AEPB Inhale 1 puff into the lungs 2 (two) times daily. 60 each 11 Past Week at Unknown time  . furosemide (LASIX) 20 MG tablet Take 20 mg by mouth 2 (two) times daily.    09/08/2014 at Unknown time  . guaiFENesin (ROBITUSSIN) 100 MG/5ML liquid Take 200 mg by mouth daily.   Past Week at Unknown time  . hydrALAZINE (APRESOLINE) 25 MG tablet Take 0.5 tablets (12.5 mg total) by mouth 3 (three) times daily. 90 tablet 6 09/08/2014 at Unknown time  . Insulin Glargine (LANTUS SOLOSTAR) 100 UNIT/ML Solostar Pen 10 units SQ qhs (Patient taking differently: Inject 10 Units into the skin at bedtime. ) 15 mL 11 09/08/2014 at Unknown time  . insulin lispro (HUMALOG KWIKPEN) 100 UNIT/ML KiwkPen 7 U SQ w/BF, 8 U  SQ w/Lunch, and 9 U SQ w/Supper (Patient taking differently: Inject 7-9 Units into the skin 3 (three) times daily. 7 U SQ w/BF, 8 U SQ w/Lunch, and 9 U SQ w/Supper) 15 mL 11 09/09/2014 at Unknown time  . isosorbide mononitrate (IMDUR) 30 MG 24 hr tablet Take 1 tablet (30 mg total) by mouth daily. 30 tablet 6 09/08/2014 at Unknown time  . levothyroxine (SYNTHROID, LEVOTHROID) 75 MCG tablet Take 1 tablet (75 mcg total) by mouth daily. 30 tablet 3 09/08/2014 at Unknown time  . metolazone (ZAROXOLYN) 2.5 MG tablet Take 1 tablet (2.5 mg total) by mouth as needed. 10 tablet 3 09/08/2014 at Unknown time  . pantoprazole (PROTONIX) 40 MG tablet Take 1 tablet (40 mg total) by mouth 2 (two) times daily. 60 tablet 6 09/08/2014 at Unknown time  . potassium chloride  (K-DUR) 10 MEQ tablet Take 2 tablets (20 mEq total) by mouth 2 (two) times daily. 120 tablet 3 09/08/2014 at Unknown time  . pravastatin (PRAVACHOL) 80 MG tablet Take 1 tablet (80 mg total) by mouth daily. (Patient taking differently: Take 80 mg by mouth at bedtime. ) 30 tablet 10 09/08/2014 at Unknown time  . tiotropium (SPIRIVA) 18 MCG inhalation capsule Place 18 mcg into inhaler and inhale every evening.    Past Week at Unknown time  . torsemide (DEMADEX) 20 MG tablet Take 2 tablets (40 mg total) by mouth daily. 60 tablet 6 09/08/2014 at Unknown time  . fluconazole (DIFLUCAN) 100 MG tablet Take 1 tablet (100 mg total) by mouth daily. (Patient not taking: Reported on 09/09/2014) 7 tablet 0    Scheduled: . folic acid  1 mg Intravenous Daily  . insulin aspart  0-9 Units Subcutaneous 6 times per day  . lactulose  300 mL Rectal Once  . levothyroxine  37.5 mcg Intravenous Daily  . piperacillin-tazobactam (ZOSYN)  IV  3.375 g Intravenous Q8H  . sodium chloride  3 mL Intravenous Q12H  . sodium chloride  3 mL Intravenous Q12H  . thiamine  100 mg Intravenous Daily  . tobramycin  2 drop Both Eyes 4 times per day  . [START ON 09/10/2014] vancomycin  750 mg Intravenous Q24H   Continuous: . heparin      VITALS: Blood pressure 92/49, pulse 64, temperature 101.4 F (38.6 C), temperature source Rectal, resp. rate 15, SpO2 100 %.  PHYSICAL EXAM:  General: very lethargic, but can be aroused, oriented x3, no acute distress. Warm extremities Head: no evidence of trauma, PERRL, EOMI, no exophtalmos or lid lag, no myxedema, no xanthelasma; normal ears, nose and oropharynx Neck: 8-9 cm jugular venous pulsations ; brisk carotid pulses without delay and no carotid bruits Chest: clear to auscultation but diffusely diminished breath sounds, no signs of consolidation by percussion or palpation, normal fremitus, symmetrical and full respiratory excursions Cardiovascular: normal position and quality of the apical  impulse, regular rhythm, distant first heart sound, split second heart sound, no rubs or gallops, 1-2/6 apical holosystolic murmur Abdomen: no tenderness or distention, no masses by palpation, no abnormal pulsatility or arterial bruits, normal bowel sounds, no hepatosplenomegaly Extremities: no clubbing, cyanosis;  no edema; 2+ radial, ulnar and brachial pulses bilaterally; 2+ right femoral, posterior tibial and dorsalis pedis pulses; 2+ left femoral, posterior tibial and dorsalis pedis pulses; no subclavian or femoral bruits Neurological: grossly nonfocal   LABS  CBC  Recent Labs  09/09/14 1150  WBC 12.6*  HGB 10.8*  HCT 33.5*  MCV 89.3  PLT 160  Basic Metabolic Panel  Recent Labs  09/09/14 1150  NA 129*  K 3.6  CL 92*  CO2 25  GLUCOSE 91  BUN 27*  CREATININE 2.05*  CALCIUM 7.8*   Liver Function Tests  Recent Labs  09/09/14 1150  AST 65*  ALT 25  ALKPHOS 124*  BILITOT 2.8*  PROT 6.4  ALBUMIN 2.3*    IMAGING: Dg Chest 2 View  09/09/2014   CLINICAL DATA:  Pt states weakness x 2 days. Denies any CP, SOB, cough, fever, or dizziness. H/o quadrupl bypass surgery 20+ years ago, defibrillator placed in 2006, h/o COPD, diabetes  EXAM: CHEST  2 VIEW  COMPARISON:  08/31/2014  FINDINGS: Changes from CABG surgery are stable. Cardiac silhouette is mildly enlarged. No mediastinal or hilar masses. Left anterior chest wall AICD is stable all position.  Lungs are hyperexpanded. There are mildly thickened interstitial markings bilaterally. No lung consolidation or edema. No pleural effusion or pneumothorax.  Bony thorax is demineralized but grossly intact.  IMPRESSION: No acute cardiopulmonary disease.   Electronically Signed   By: Lajean Manes M.D.   On: 09/09/2014 12:43    ECG: NSR, very broad QRS due to atypical LBBB 198 ms  TELEMETRY: NSR  IMPRESSION/RECOMMENDATION:  Acute febrile illness with encephalopathy - source not certain  1. Chronic Systolic Heart Failure:  Ischemic cardiomyopathy. ECHO LVEF 40%. NYHA IIIb - Volume status probably at baseline or slightly dry. Avoid rapid IV fluid administration - He is not on ACEi or spironolactone due to CKD.  2. Paroxysmal A fib/Aflutter: Maintaining SR. On amiodarone 200 mg daily - will hold since LFTs mildly abnormal - repeat. Note elevated ammonia level, not sure how to interpret in setting of chronic renal failure and heart failure 3. Mitral regurgitation - moderate to severe due to restriction of posterior MV leaflet. Unfortunately not a good surgical candidate  4. CKD: Stable/slightly better than baseline.  5. DM 6. CAD s/p CABG. No signs of acute coronary syndrome. 7. COPD  with chronic respiratory failure on chronic O2. ABG is pretty good, doubt pneumonia 8. H/o VT: Medtronic ICD  9. Nonspecific IVCD - QRS 172ms; suspect upgrade to CRT-D has been considered, but will review with EP. 10. Hyponatremia - in current situation probably an expression of hypovolemia.  Other than lethargy, physical exam not suggestive of low CO. No inotropes recommended.   Time Spent Directly with Patient: 60 minutes  Sanda Klein, MD, [facility] [phone] office [phone] pager   09/09/2014, 4:39 PM

## 2014-09-09 NOTE — ED Notes (Signed)
Patient transported to X-ray 

## 2014-09-09 NOTE — Progress Notes (Addendum)
ANTICOAGULATION CONSULT NOTE - Initial Consult  Pharmacy Consult for heparin Indication: atrial fibrillation  Allergies  Allergen Reactions  . Niacin Itching    Niaspan     Patient Measurements:   Heparin Dosing Weight: 68kg  Vital Signs: Temp: 101.4 F (38.6 C) (04/16 1130) Temp Source: Rectal (04/16 1130) BP: 94/48 mmHg (04/16 1400) Pulse Rate: 67 (04/16 1400)  Labs:  Recent Labs  09/09/14 1150  HGB 10.8*  HCT 33.5*  PLT 160  CREATININE 2.05*    Estimated Creatinine Clearance: 28.2 mL/min (by C-G formula based on Cr of 2.05).   Medical History: Past Medical History  Diagnosis Date  . VENTRICULAR TACHYCARDIA   . PERIPHERAL VASCULAR DISEASE   . HYPERTENSION     Hypertensive retinopathy-grade II OU  . HYPERCHOLESTEROLEMIA   . CORONARY ARTERY DISEASE     Hx of CABG  . Chronic systolic heart failure     Ischemic cardiomyopathy (01/2014 EF 40-45% with hypokinesis + small area of akinesis  . SEBORRHEIC DERMATITIS   . Chronic renal insufficiency, stage III (moderate)     CrCl 40s-50s  . DIVERTICULOSIS OF COLON   . COPD, severe   . COLONIC POLYPS   . ANXIETY   . ICD (implantable cardiac defibrillator) in place   . Type II diabetes mellitus     +background diabetic retinopathy OU  . Chronic lower back pain   . DJD (degenerative joint disease)     "hands" (05/24/2012)  . Osteoarthritis of finger   . History of atrial flutter   . Thrombus 12/2013    on ICD lead--started anticoag and his cardioversion for atrial flutter was postponed.  . Colon stricture 2015    with features worrisome for mass, also with recent rising CEA level (possible colon cancer)-GI MD= Dr. Franki Cabot recent eval/follow up with Dr. Earlean Shawl was summer 2014, at which time he recommended colonoscopy but pt declined.  . Macular degeneration, age related, nonexudative     OU    Medications:  Infusions:  . heparin    . vancomycin 1,000 mg (09/09/14 1442)    Assessment: 9 yom presented  to the ED with fever, fatigue and AMS. To start IV heparin for afib. Pt was on eliquis PTA with last dose yesterday. This will be on hold d/t to pts AMS. Baseline H/H slightly low and platelets are WNL.   Goal of Therapy:  Heparin level 0.3-0.7 units/ml aPTT 66-102 seconds Monitor platelets by anticoagulation protocol: Yes   Plan:  - Check a baseline heparin level and aPTT - Heparin gtt 1000 units/hr - Check an 8 hour heparin level - Daily HL, CBC and aPTT  Rumbarger, Rande Lawman 09/09/2014,3:19 PM  Addendum: Also starting broad-spectrum antibiotics for possible sepsis. First doses already given in the ED.  Plan: - Zosyn 3.375gm IV Q8H (4 hr inf) - Vanc 750mg  IV Q24H - F/u renal fxn, C&S, clinical status and trough at [facility], PharmD, BCPS Pager # [phone] 09/09/2014 4:02 PM  Addendum:  MD is also adding fluconazole for esophageal candidiasis. He was prescribed for it outpt but they were concerned about the interaction issue with amiodarone. This is expected so we're going to use a low dose of fluconazole here.   Plan  Fluconazole 100mg  IV q24  Onnie Boer, PharmD Pager: [phone] 09/09/2014 5:03 PM

## 2014-09-09 NOTE — ED Notes (Signed)
CBG 125 °

## 2014-09-09 NOTE — H&P (Signed)
[facility] History and Physical                                                                                    Raymir Frommelt, is a 79 y.o. male  MRN: [id number]   DOB - 01/25/1936  Admit Date - 09/09/2014  Outpatient Primary MD for the patient is Tammi Sou, MD  79 y/o ? Recent d/c [facility] Acute sys HF by Cardiology, PAf on Amio + NOAC, mod-severe Mitral reg, Cardiorenal syn, MDMty II, Prior H/o VT, Hypothyroid [pon amio]COPD gold stg IV, isch CM CABG x4 2003s/p AICd with complaiction clot, DJD, Bipolar[. Anemia last admit--EGD=Candida Rx c Fluconazole, Chr Resp failure 2/2 to HF  On d/c on 4/12 felt well   Was realtively inactive after the sa--has been listless for the past cpl of days Felt warm to touch poer WIfe 4/15 Mild cough-mild sputum-looked ~dark brown No diarrhea no stom aches - Hematemesis - bleeding - blurred vision however has had some conjunctival injection  - Fall, - unilateral weakness  Just very listless overall No loose stool , no tarry stool ,  + Rash over frontal chest as well as upper body , wife did not notice is recently .  ? anorexia Swallow ok per wife  Seems more yellow to his wife than he has been previously. She notices on 4/15.  Didn't get fluconazole filled apparently because there was some concerns about interaction with this and his amiodarone   EOMI NCAT  JVD about 3 cm, no thyromegaly noted to cursory exam no lymphadenopathy  Papular vesicular rash across the front of the chest  PPM pocket appears nontender mobile  S1-S2 no murmur rub or gallop  Liver span increased about 7 cm below right costal margin but not tender  No splenomegaly noted  Smiling umbilicus  Grade 1 lower extremity edema  Finger-nose-finger test deferred  Non-tremulous    P fever unknown origin- DDX potential viral hepatitis versus drug reaction secondary to interaction between fluconazole and amiodarone as they potentiate each other. QTC  is not prolonged Question is raised whether he could have a line sepsis as well as he had a PICC line/Central line on recent admission so blood cultures are pending-he has had a recent echocardiogram that showed slight worsening of his valvular issues   His LFTs are elevated in a distribution of EtOH/Nash pathogenesis and with his bilirubin was 2.8 , I'm not sure that he does not have acute liver injury from combination of fluconazole/amiodarone --this can also cause a fever his amiodarone has been at a steady state in toxicity usually is pulmonary , not hepatic nonetheless this is on the differential as well  we will get a viral hepatitis panel, we will volume replete him very cautiously with a 250 cc bolus as he was mildly hypotensive  At this stage we will use Zosyn monotherapy for presumed intra-abdominal infection--another differential could be a type of cholangitis but he is not tender  appreciated cardiology  being involved in his care to direct diuresis   We will volume restrict him as he has mild hypovolemic hyponatremia  We will obtain a urinary sodium and urinary  osmolality  blood gas has been ordered and is pending as well to determine if there is an acidosis going on   fever control be challenging as he has mildly elevated LFTs I think it's reasonable to use Tylenol suppositories as this is not cleared by first pass metabolism-- another thought is that  The patient couldbe given IV steroids  I will also give him lactulose empirically while we are waiting on an ammonia level  PharmD recommended that patient get   he has potential to be critically ill although appears stable at present time Patient seen, examined and plan formulated with Ms Lissa Merlin, NP at the patient's bedside  Verneita Griffes, MD [facility] ([phone]) [phone]  HPI 79 year old male patient, recently discharged on 4/12 after admission for acute systolic heart failure decompensation. During that  admission patient had cardiogenic shock and required central line for milrinone and norepinephrine as well as CVP readings. He also underwent right heart catheterization. He also had anemia prompting an EGD that demonstrated esophageal candidiasis and was prescribed 10 days of fluconazole. His Lasix was changed to torsemide. He diuresed 12 pounds during that admission. Other medical problems include paroxysmal atrial fibrillation on amiodarone and eliquis, moderate to severe mitral regurgitation, chronic kidney disease stage III suspected cardiorenal etiology, diabetes mellitus on insulin, known CAD, COPD, history of ventricular tachycardia post ICD, and hypothyroidism. Patient presents to the ER with reports of fatigue and fever. Patient somewhat listless and having difficult time remaining awake to provide history. Wife at bedside and assisting. According to the wife, last night the patient felt hot fevers but they did not check the patient's temperature. Patient does report he had a cough recently with some brown sputum which is now clear. The wife reports them as being quite listless which is not normal for him. Patient has had poor intake but has not had any odynophagia. He does report that his mouth is dry. He denies diarrhea or abdominal pain. Patient had been prescribed Diflucan to take for the esophageal candidiasis but wife reports [facility] has not filled this prescription because they were concerned about drug drug interactions may been unable to contact the prescribing provider for clarification. Patient has also had conjunctivitis type symptoms during the last hospitalization managed conservatively by nursing staff with frequent eye cleansing.  Upon presentation to the ER the patient was awake but listless. He had a fever of 101.4. His blood pressure was 110/48 was not tachycardic. His room-air saturations were 97%. Chest x-ray and urinalysis were unremarkable. Sodium was slightly lower than baseline  at 129. BUN 27 creatinine 2.05. Alkaline phosphatase 124, AST 65 and total bilirubin 2.8. Previous LFTs obtained on 2/25 with alk phosphatase 109, AST 65, and normal total bilirubin. Lactic acid was normal at 1.66 and troponin was normal 0.03. BNP was 550 noting his BNP on 4/6 was 800 and in the past has peaked as high as 6721.  Review of Systems   In addition to the HPI above,  No Headache, changes with Vision or hearing, new weakness, tingling, numbness in any extremity, noted bilateral conjunctivitis No problems swallowing food or Liquids, indigestion/reflux-reports anorexia since discharge from hospital No Chest pain, Shortness of Breath, palpitations, orthopnea or DOE No Abdominal pain, N/V; no melena or hematochezia, no dark tarry stools, Bowel movements are regular, No dysuria, hematuria or flank pain No lesions, masses or bruises, No new joints pains-aches No polyuria, polydypsia or polyphagia,  *A full 10 point Review of Systems was done, except as stated  above, all other Review of Systems were negative.  Social History History  Substance Use Topics  . Smoking status: Former Smoker -- 2.00 packs/day for 50 years    Types: Cigarettes    Quit date: 06/24/2000  . Smokeless tobacco: Never Used  . Alcohol Use: 0.0 oz/week    0 Standard drinks or equivalent per week     Comment: 05/24/2012 "used to drink alot; quit > 10 yr ago"    Family History Family History  Problem Relation Age of Onset  . Heart attack Father 72  . Heart attack Brother     Vague history    Prior to Admission medications   Medication Sig Start Date End Date Taking? Authorizing Provider  amiodarone (PACERONE) 200 MG tablet Take 1 tablet (200 mg total) by mouth daily. 02/09/14  Yes Amy D Clegg, NP  apixaban (ELIQUIS) 5 MG TABS tablet Take 0.5 tablets (2.5 mg total) by mouth 2 (two) times daily. 09/05/14  Yes Amy D Clegg, NP  carvedilol (COREG) 6.25 MG tablet Take 6.25 mg by mouth 2 (two) times daily with a  meal.  08/19/14  Yes Historical Provider, MD  Fluticasone-Salmeterol (ADVAIR) 500-50 MCG/DOSE AEPB Inhale 1 puff into the lungs 2 (two) times daily. 04/05/14  Yes Noralee Space, MD  furosemide (LASIX) 20 MG tablet Take 20 mg by mouth 2 (two) times daily.  08/19/14  Yes Historical Provider, MD  guaiFENesin (ROBITUSSIN) 100 MG/5ML liquid Take 200 mg by mouth daily.   Yes Historical Provider, MD  hydrALAZINE (APRESOLINE) 25 MG tablet Take 0.5 tablets (12.5 mg total) by mouth 3 (three) times daily. 09/05/14  Yes Amy D Clegg, NP  Insulin Glargine (LANTUS SOLOSTAR) 100 UNIT/ML Solostar Pen 10 units SQ qhs Patient taking differently: Inject 10 Units into the skin at bedtime.  02/10/14  Yes Tammi Sou, MD  insulin lispro (HUMALOG KWIKPEN) 100 UNIT/ML KiwkPen 7 U SQ w/BF, 8 U SQ w/Lunch, and 9 U SQ w/Supper Patient taking differently: Inject 7-9 Units into the skin 3 (three) times daily. 7 U SQ w/BF, 8 U SQ w/Lunch, and 9 U SQ w/Supper 08/02/14  Yes Tammi Sou, MD  isosorbide mononitrate (IMDUR) 30 MG 24 hr tablet Take 1 tablet (30 mg total) by mouth daily. 02/03/14  Yes Amy D Clegg, NP  levothyroxine (SYNTHROID, LEVOTHROID) 75 MCG tablet Take 1 tablet (75 mcg total) by mouth daily. 05/12/14  Yes Tammi Sou, MD  metolazone (ZAROXOLYN) 2.5 MG tablet Take 1 tablet (2.5 mg total) by mouth as needed. 09/05/14  Yes Amy D Clegg, NP  pantoprazole (PROTONIX) 40 MG tablet Take 1 tablet (40 mg total) by mouth 2 (two) times daily. 09/05/14  Yes Amy D Clegg, NP  potassium chloride (K-DUR) 10 MEQ tablet Take 2 tablets (20 mEq total) by mouth 2 (two) times daily. 09/05/14  Yes Amy D Clegg, NP  pravastatin (PRAVACHOL) 80 MG tablet Take 1 tablet (80 mg total) by mouth daily. Patient taking differently: Take 80 mg by mouth at bedtime.  03/06/14  Yes Minus Breeding, MD  tiotropium (SPIRIVA) 18 MCG inhalation capsule Place 18 mcg into inhaler and inhale every evening.    Yes Historical Provider, MD  torsemide (DEMADEX)  20 MG tablet Take 2 tablets (40 mg total) by mouth daily. 09/05/14  Yes Amy D Clegg, NP  fluconazole (DIFLUCAN) 100 MG tablet Take 1 tablet (100 mg total) by mouth daily. Patient not taking: Reported on 09/09/2014 09/05/14   Conrad [place], NP  Allergies  Allergen Reactions  . Niacin Itching    Niaspan     Physical Exam  Vitals  Blood pressure 94/48, pulse 67, temperature 101.4 F (38.6 C), temperature source Rectal, resp. rate 14, SpO2 93 %.  General:  In mild to moderate distress as evidenced by continued listlessness insetting of fever  Psych: Listless affect, awakens and seems oriented but drifts off to sleep easily therefore exam limited  Neuro:   No focal neurological deficits, CN II through XII intact, Strength 5/5 all 4 extremities, Sensation intact all 4 extremities. Lethargic but otherwise seems appropriate  ENT:  Ears and Eyes appear Normal, Conjunctivae with yellow purulent drainage bilaterally, PER. Dry oral mucosa without erythema or exudates.  Neck:  Supple, No lymphadenopathy appreciated  Respiratory:  Symmetrical chest wall movement, Good air movement bilaterally, CTAB. Room Air  Cardiac:  RRR, No Murmurs, focal edema right lower extremity primarily around the ankle, no JVD, No carotid bruits, peripheral pulses palpable at 2+  Abdomen:  Positive bowel sounds, Soft, Non tender, Non distended,  No masses appreciated, liver border palpable 4 fingerbreadths below the rib cage and somewhat tender  Skin:  Does have jaundiced appearance, No Cyanosis, poor Skin Turgor, No Skin Rash or Bruise.  Extremities: Symmetrical without obvious trauma or injury,  no effusions.  Data Review  CBC  Recent Labs Lab 09/03/14 0600 09/04/14 0445 09/05/14 0536 09/09/14 1150  WBC 12.1* 10.0 12.4* 12.6*  HGB 9.4* 9.0* 9.5* 10.8*  HCT 27.2* 27.0* 29.1* 33.5*  PLT 105* 98* 123* 160  MCV 82.9 85.4 86.6 89.3  MCH 28.7 28.5 28.3 28.8  MCHC 34.6 33.3 32.6 32.2  RDW 16.6* 17.0* 17.5*  19.2*    Chemistries   Recent Labs Lab 09/03/14 0600 09/04/14 0445 09/05/14 0536 09/09/14 1150  NA 128* 134* 134* 129*  K 3.2* 3.3* 4.4 3.6  CL 87* 90* 91* 92*  CO2 27 38* 35* 25  GLUCOSE 151* 132* 164* 91  BUN 49* 39* 32* 27*  CREATININE 2.33* 2.09* 2.23* 2.05*  CALCIUM 8.2* 8.2* 8.2* 7.8*  AST  --   --   --  65*  ALT  --   --   --  25  ALKPHOS  --   --   --  124*  BILITOT  --   --   --  2.8*    estimated creatinine clearance is 28.2 mL/min (by C-G formula based on Cr of 2.05).  No results for input(s): TSH, T4TOTAL, T3FREE, THYROIDAB in the last 72 hours.  Invalid input(s): FREET3  Coagulation profile No results for input(s): INR, PROTIME in the last 168 hours.  No results for input(s): DDIMER in the last 72 hours.  Cardiac Enzymes No results for input(s): CKMB, TROPONINI, MYOGLOBIN in the last 168 hours.  Invalid input(s): CK  Invalid input(s): POCBNP  Urinalysis    Component Value Date/Time   COLORURINE AMBER* 09/09/2014 [place] 09/09/2014 1353   LABSPEC 1.012 09/09/2014 1353   PHURINE 5.5 09/09/2014 1353   GLUCOSEU NEGATIVE 09/09/2014 1353   HGBUR NEGATIVE 09/09/2014 1353   BILIRUBINUR NEGATIVE 09/09/2014 1353   KETONESUR NEGATIVE 09/09/2014 1353   PROTEINUR NEGATIVE 09/09/2014 1353   UROBILINOGEN 1.0 09/09/2014 1353   NITRITE NEGATIVE 09/09/2014 1353   LEUKOCYTESUR NEGATIVE 09/09/2014 1353    Imaging results:   Dg Chest 2 View  09/09/2014   CLINICAL DATA:  Pt states weakness x 2 days. Denies any CP, SOB, cough, fever, or dizziness. H/o quadrupl  bypass surgery 20+ years ago, defibrillator placed in 2006, h/o COPD, diabetes  EXAM: CHEST  2 VIEW  COMPARISON:  08/31/2014  FINDINGS: Changes from CABG surgery are stable. Cardiac silhouette is mildly enlarged. No mediastinal or hilar masses. Left anterior chest wall AICD is stable all position.  Lungs are hyperexpanded. There are mildly thickened interstitial markings bilaterally. No  lung consolidation or edema. No pleural effusion or pneumothorax.  Bony thorax is demineralized but grossly intact.  IMPRESSION: No acute cardiopulmonary disease.   Electronically Signed   By: Lajean Manes M.D.   On: 09/09/2014 12:43   Dg Chest Port 1 View  08/31/2014   CLINICAL DATA:  Central line placement, history hypertension, coronary artery disease, carotid artery disease, chronic renal insufficiency stage III, type 2 diabetes, former smoker  EXAM: PORTABLE CHEST - 1 VIEW  COMPARISON:  [facility] portable exam 1439 hours compared to 0440 hours  FINDINGS: New RIGHT jugular central venous catheter with tip projecting over SVC.  LEFT subclavian pacemaker/AICD leads with tips projecting over RIGHT ventricle.  Enlargement of cardiac silhouette post CABG with pulmonary vascular congestion.  Mild perihilar infiltrates slightly greater on LEFT favor pulmonary edema.  No pleural effusion or pneumothorax.  Atherosclerotic calcification aorta.  IMPRESSION: No pneumothorax following RIGHT jugular line placement.  Mild pulmonary edema/CHF.   Electronically Signed   By: Lavonia Dana M.D.   On: 08/31/2014 15:05   Dg Chest Port 1 View  08/31/2014   CLINICAL DATA:  Dyspnea  EXAM: PORTABLE CHEST - 1 VIEW  COMPARISON:  Portable chest x-ray of January 25, 2014  FINDINGS: The lungs are well-expanded. The interstitial markings are mildly increased diffusely. There is stable pleural scarring peripherally in the left mid hemithorax. The cardiac silhouette is enlarged. The central pulmonary vascularity is prominent. The permanent pacemaker defibrillator is unchanged in appearance. The patient has undergone previous CABG.  IMPRESSION: COPD with superimposed mild CHF.  There is no pneumonia.   Electronically Signed   By: David  [place]   On: 08/31/2014 07:26     EKG: Sinus rhythm with left bundle branch block and marked voltage criteria consistent with LVH   Assessment & Plan  Principal Problem:   Fever/fatigue -Admit to  stepdown unit -Etiology unclear; certainly differential includes viral etiology such as influenza but also given subtle elevation in hepatic enzymes as well as jaundiced appearance patient could be acute hepatitis -Urinalysis and chest x-ray unrevealing -Check influenza PCR -Lactic acid currently is normal and baseline blood pressure normally soft with occasionally low MAP readings as well so low index of suspicion this is an active sepsis process but again need to watch closely -As precaution begin broad-spectrum empiric antibiotics since patient recently hospitalized and did require central line placement i.e. could have bacteremia -Check blood cultures -Check Procalcitonin but likely to be falsely elevated in setting of chronic kidney disease  Active Problems:   Chronic systolic CHF, NYHA class 4 -Appears compensated at this juncture -Holding diuretics for now since patient appears to be dry -We'll give low volume boluses periodically based on blood pressure and urine output      Candida esophagitis -Has not been on Diflucan since discharge since unable to obtain medication from [place] as described above -Likely will need to involve pharmacist and checking drug to drug interactions before resuming Diflucan -Patient currently asymptomatic -Pharm D to dose fluconazole    Metabolic encephalopathy -Likely related to current febrile process -Mild elevation in hepatic enzymes so we'll check ammonia level to make  sure not hepatic in etiology -Patient quite listless so we'll make nothing by mouth for now until more awake -Check ABG to rule out a Procardia    Atrial flutter -Currently maintaining sinus rhythm and rate controlled -Amiodarone on hold presently because of nothing by mouth status; if to remain nothing by mouth we'll need to convert to IV route -CHADVASc =6   Chronic anticoagulation -Eliquis on hold due to altered mentation -Pharmacy to dose heparin    Conjunctivitis -Tobramycin eyedrops    Type 2 diabetes mellitus,controlled -Since poor oral intake and CBGs relatively well controlled we'll hold home Lantus for now -Check CBGs every 4 hours and provide sensitive sliding scale insulin    CKD stage 3, GFR 30-59 ml/min -Renal function currently at baseline    Chronic hyponatremia -Previous sodium 134 and current sodium 129 -On Lasix prior to admission- patient clinically appears volume depleted -Check urine sodium and urine osmolality    Carotid artery disease -No symptoms    Automatic implantable cardioverter-defibrillator in situ -Stable without any apparent discharges    COPD, severe -Stable and compensated without wheezing    Iron deficiency anemia due to chronic blood loss -Seems slightly hemoconcentrated with current hemoglobin 10.8 with baseline hemoglobin recently averaging between 8.5 and 9.5    DVT Prophylaxis: Full dose IV heparin eliquis on hold  Family Communication:  Wife at bedside   Code Status:  Wife confirmed patient is DO NOT RESUSCITATE  Condition:  Guarded  Time spent in minutes : 85   ELLIS,ALLISON L. ANP on 09/09/2014 at 3:17 PM  Between 7am to 7pm - Pager - [phone]  After 7pm go to [email or web address] - password [email or web address]  And look for the night coverage person covering me after hours  [facility]

## 2014-09-10 ENCOUNTER — Inpatient Hospital Stay (HOSPITAL_COMMUNITY): Payer: Medicare Other

## 2014-09-10 DIAGNOSIS — R509 Fever, unspecified: Secondary | ICD-10-CM | POA: Diagnosis present

## 2014-09-10 DIAGNOSIS — E871 Hypo-osmolality and hyponatremia: Secondary | ICD-10-CM

## 2014-09-10 LAB — GLUCOSE, CAPILLARY
GLUCOSE-CAPILLARY: 70 mg/dL (ref 70–99)
Glucose-Capillary: 123 mg/dL — ABNORMAL HIGH (ref 70–99)
Glucose-Capillary: 141 mg/dL — ABNORMAL HIGH (ref 70–99)
Glucose-Capillary: 174 mg/dL — ABNORMAL HIGH (ref 70–99)
Glucose-Capillary: 125 mg/dL — ABNORMAL HIGH (ref 70–99)

## 2014-09-10 LAB — COMPREHENSIVE METABOLIC PANEL
ALBUMIN: 1.9 g/dL — AB (ref 3.5–5.2)
ALK PHOS: 118 U/L — AB (ref 39–117)
ALT: 21 U/L (ref 0–53)
AST: 64 U/L — ABNORMAL HIGH (ref 0–37)
Anion gap: 13 (ref 5–15)
BILIRUBIN TOTAL: 2.1 mg/dL — AB (ref 0.3–1.2)
BUN: 25 mg/dL — ABNORMAL HIGH (ref 6–23)
CALCIUM: 7.1 mg/dL — AB (ref 8.4–10.5)
CO2: 22 mmol/L (ref 19–32)
CREATININE: 2.02 mg/dL — AB (ref 0.50–1.35)
Chloride: 94 mmol/L — ABNORMAL LOW (ref 96–112)
GFR, EST AFRICAN AMERICAN: 35 mL/min — AB (ref >90)
GFR, EST NON AFRICAN AMERICAN: 30 mL/min — AB (ref >90)
Glucose, Bld: 135 mg/dL — ABNORMAL HIGH (ref 70–99)
Potassium: 3.7 mmol/L (ref 3.5–5.1)
Sodium: 129 mmol/L — ABNORMAL LOW (ref 135–145)
Total Protein: 5.3 g/dL — ABNORMAL LOW (ref 6.0–8.3)

## 2014-09-10 LAB — PROTIME-INR
INR: 1.51 — AB (ref 0.00–1.49)
PROTHROMBIN TIME: 18.4 s — AB (ref 11.6–15.2)

## 2014-09-10 LAB — CBC
HCT: 31.9 % — ABNORMAL LOW (ref 39.0–52.0)
HEMOGLOBIN: 10.5 g/dL — AB (ref 13.0–17.0)
MCH: 29.5 pg (ref 26.0–34.0)
MCHC: 32.9 g/dL (ref 30.0–36.0)
MCV: 89.6 fL (ref 78.0–100.0)
Platelets: 166 10*3/uL (ref 150–400)
RBC: 3.56 MIL/uL — ABNORMAL LOW (ref 4.22–5.81)
RDW: 19 % — ABNORMAL HIGH (ref 11.5–15.5)
WBC: 16 10*3/uL — ABNORMAL HIGH (ref 4.0–10.5)

## 2014-09-10 LAB — HEPARIN LEVEL (UNFRACTIONATED): Heparin Unfractionated: 1.4 IU/mL — ABNORMAL HIGH (ref 0.30–0.70)

## 2014-09-10 LAB — APTT
aPTT: 58 seconds — ABNORMAL HIGH (ref 24–37)
aPTT: 134 seconds — ABNORMAL HIGH (ref 24–37)

## 2014-09-10 MED ORDER — INSULIN ASPART 100 UNIT/ML ~~LOC~~ SOLN
0.0000 [IU] | Freq: Three times a day (TID) | SUBCUTANEOUS | Status: DC
  Administered 2014-09-10: 2 [IU] via SUBCUTANEOUS
  Administered 2014-09-11 (×2): 1 [IU] via SUBCUTANEOUS
  Administered 2014-09-12: 2 [IU] via SUBCUTANEOUS
  Administered 2014-09-12 – 2014-09-13 (×2): 1 [IU] via SUBCUTANEOUS
  Administered 2014-09-13 (×2): 2 [IU] via SUBCUTANEOUS
  Administered 2014-09-14 (×2): 1 [IU] via SUBCUTANEOUS

## 2014-09-10 MED ORDER — LEVOTHYROXINE SODIUM 50 MCG PO TABS
75.0000 ug | ORAL_TABLET | Freq: Every day | ORAL | Status: DC
  Administered 2014-09-11 – 2014-09-14 (×4): 75 ug via ORAL
  Filled 2014-09-10 (×6): qty 1

## 2014-09-10 MED ORDER — APIXABAN 2.5 MG PO TABS
2.5000 mg | ORAL_TABLET | Freq: Two times a day (BID) | ORAL | Status: DC
  Administered 2014-09-10 – 2014-09-11 (×3): 2.5 mg via ORAL
  Filled 2014-09-10 (×4): qty 1

## 2014-09-10 MED ORDER — HEPARIN (PORCINE) IN NACL 100-0.45 UNIT/ML-% IJ SOLN
1050.0000 [IU]/h | INTRAMUSCULAR | Status: DC
  Filled 2014-09-10: qty 250

## 2014-09-10 MED ORDER — FOLIC ACID 1 MG PO TABS
1.0000 mg | ORAL_TABLET | Freq: Every day | ORAL | Status: DC
  Administered 2014-09-11 – 2014-09-14 (×4): 1 mg via ORAL
  Filled 2014-09-10 (×4): qty 1

## 2014-09-10 MED ORDER — ACETAMINOPHEN 500 MG PO TABS: 500.0000 mg | ORAL_TABLET | Freq: Four times a day (QID) | ORAL | Status: DC | PRN

## 2014-09-10 MED ORDER — CARVEDILOL 6.25 MG PO TABS
6.2500 mg | ORAL_TABLET | Freq: Two times a day (BID) | ORAL | Status: DC
  Administered 2014-09-10: 6.25 mg via ORAL
  Filled 2014-09-10 (×5): qty 1

## 2014-09-10 MED ORDER — VITAMIN B-1 100 MG PO TABS
100.0000 mg | ORAL_TABLET | Freq: Every day | ORAL | Status: DC
  Administered 2014-09-11 – 2014-09-14 (×4): 100 mg via ORAL
  Filled 2014-09-10 (×4): qty 1

## 2014-09-10 NOTE — Progress Notes (Signed)
[name] - Stepdown/ICU TEAM Progress Note  [name] DTO:[id number] DOB: 08-Oct-1935 DOA: 09/09/2014 PCP: Tammi Sou, MD  Admit HPI / Brief Narrative: 79 year old male patient, discharged on 4/12 after admission for an acute systolic heart failure decompensation. During that admission patient had cardiogenic shock and required milrinone and norepinephrine. He also underwent right heart catheterization. He had anemia prompting an EGD that demonstrated esophageal candidiasis and was prescribed 10 days of fluconazole. His Lasix was changed to torsemide. He diuresed 12 pounds during that admission. Other medical problems include paroxysmal atrial fibrillation on amiodarone and eliquis, moderate to severe mitral regurgitation, chronic kidney disease stage III suspected cardiorenal etiology, diabetes mellitus on insulin, known CAD, COPD, history of ventricular tachycardia post ICD, and hypothyroidism.   On 4/16 the patient presented to the ER with reports of fatigue and fever.  He was listless and had a difficult time remaining awake to provide history. He denied diarrhea or abdominal pain. Patient had been prescribed Diflucan to take for the esophageal candidiasis but wife reports [facility] had not filled this prescription because they were concerned about drug drug interactions and hand been unable to contact the prescribing provider for clarification.   Upon presentation to the ER the patient had a fever of 101.4. His blood pressure was 110/48. His room-air saturations were 97%. Chest x-ray and urinalysis were unremarkable. Sodium was slightly lower than baseline at 129. BUN 27 creatinine 2.05. Alkaline phosphatase 124, AST 65 and total bilirubin 2.8. Lactic acid was normal at 1.66 and troponin was normal 0.03. BNP was 550 noting his BNP on 4/6 was 800 and in the past has peaked as high as 6721.  HPI/Subjective: Patient is sleeping comfortably when I enter the room.  He wakes to my  exam but is slow to respond to questions.  He seems mildly confused but not in acute distress.  He denies chest pain nausea vomiting abdominal pain or shortness of breath at this time.  Assessment/Plan:  FUO / fatigue - Metabolic encephalopathy  Urinalysis unrevealing - initial chest x-ray unrevealing, but follow-up chest x-ray today suggests possible early right upper lobe infiltrate - influenza screen negative - very mildly elevated ammonia and admission not likely etiology - blood cultures negative thus far - will treat as HCPAP for now and continue to follow  Transaminitis Very mild - acute viral hepatitis panel negative - follow trend  Ischemic cardiomyopathy - severe systolic heart failure No evidence of acute exacerbation this time - discharge weight 4/12 was 68 kg - currently at 67.5 kg  Chronic hyponatremia Due to severe heart failure   Paroxysmal atrial fibrillation On chronic amiodarone and eliquis - resume eliquis today  Mitral regurgitation - moderate to severe Not a candidate for surgical intervention per cardiology  Chronic kidney disease with baseline creatinine 2.4 - stage 3, GFR 30-59 ml/min Renal function is presently stable/better than baseline  Diabetes mellitus CBG currently well-controlled - continue to follow  Coronary artery disease status post CABG Cardiology following - no evidence of acute issues  COPD Well compensated at the present time  Hypothyroidism Continue home medical therapy  Recent esophageal candidiasis To complete Diflucan therapy - no evidence of worsening clinically - diagnosed via EGD last admission - did not complete Diflucan course as outpatient due to pharmacies concern for drug/drug interactions  Iron deficiency anemia due to chronic blood loss Check anemia panel in a.m.  Code Status: FULL Family Communication: no family present at time of exam Disposition Plan: SDU  Consultants: [facility]  cardiology  Procedures: None  Antibiotics: Zosyn 4/16 > Vancomycin 4/16 > Fluconazole 4/16 >  DVT prophylaxis: IV heparin > apixaban  Objective: Blood pressure 104/44, pulse 70, temperature 98.4 F (36.9 C), temperature source Oral, resp. rate 22, height 5\' 7"  (1.702 m), weight 67.5 kg (148 lb 13 oz), SpO2 94 %.  Intake/Output Summary (Last 24 hours) at 09/10/14 1405 Last data filed at 09/10/14 1204  Gross per 24 hour  Intake  373.5 ml  Output    375 ml  Net   -1.5 ml   Exam: General: No acute respiratory distress - mildly confused Lungs: Clear to auscultation bilaterally without wheezes or crackles Cardiovascular: Regular rate and rhythm without murmur gallop or rub appreciable Abdomen: Nontender, nondistended, soft, bowel sounds positive, no rebound, no ascites, no appreciable mass Extremities: No significant cyanosis, clubbing, or edema bilateral lower extremities  Data Reviewed: Basic Metabolic Panel:  Recent Labs Lab 09/04/14 0445 09/05/14 0536 09/09/14 1150 09/09/14 1723 09/10/14 0558  NA 134* 134* 129* 129* 129*  K 3.3* 4.4 3.6 3.5 3.7  CL 90* 91* 92* 94* 94*  CO2 38* 35* 25 25 22   GLUCOSE 132* 164* 91 120* 135*  BUN 39* 32* 27* 27* 25*  CREATININE 2.09* 2.23* 2.05* 1.99* 2.02*  CALCIUM 8.2* 8.2* 7.8* 7.5* 7.1*    Liver Function Tests:  Recent Labs Lab 09/09/14 1150 09/09/14 1723 09/10/14 0558  AST 65* 51* 64*  ALT 25 21 21   ALKPHOS 124* 101 118*  BILITOT 2.8* 2.6* 2.1*  PROT 6.4 5.4* 5.3*  ALBUMIN 2.3* 1.9* 1.9*    Recent Labs Lab 09/09/14 1515  AMMONIA 49*   Coags:  Recent Labs Lab 09/09/14 1723 09/10/14 0110  INR 1.66* 1.51*    Recent Labs Lab 09/09/14 1723 09/10/14 0110 09/10/14 1043  APTT 47* 58* 134*    CBC:  Recent Labs Lab 09/04/14 0445 09/05/14 0536 09/09/14 1150 09/09/14 1723 09/10/14 0110  WBC 10.0 12.4* 12.6* 11.9* 16.0*  NEUTROABS  --   --   --  9.3*  --   HGB 9.0* 9.5* 10.8* 9.8* 10.5*  HCT 27.0*  29.1* 33.5* 29.7* 31.9*  MCV 85.4 86.6 89.3 88.4 89.6  PLT 98* 123* 160 142* 166    CBG:  Recent Labs Lab 09/09/14 1637 09/09/14 2219 09/09/14 2353 09/10/14 0419 09/10/14 0746  GLUCAP 118* 96 83 70 123*    Recent Results (from the past 240 hour(s))  Culture, blood (routine x 2)     Status: None (Preliminary result)   Collection Time: 09/09/14 11:55 AM  Result Value Ref Range Status   Specimen Description BLOOD RIGHT ARM  Final   Special Requests BOTTLES DRAWN AEROBIC AND ANAEROBIC 10 CC  Final   Culture   Final           BLOOD CULTURE RECEIVED NO GROWTH TO DATE CULTURE WILL BE HELD FOR 5 DAYS BEFORE ISSUING A FINAL NEGATIVE REPORT Performed at [facility]    Report Status PENDING  Incomplete  Culture, blood (routine x 2)     Status: None (Preliminary result)   Collection Time: 09/09/14 12:00 PM  Result Value Ref Range Status   Specimen Description BLOOD RIGHT HAND  Final   Special Requests BOTTLES DRAWN AEROBIC ONLY 5 CC  Final   Culture   Final           BLOOD CULTURE RECEIVED NO GROWTH TO DATE CULTURE WILL BE HELD FOR 5 DAYS BEFORE ISSUING A FINAL  NEGATIVE REPORT Performed at [facility]    Report Status PENDING  Incomplete  MRSA PCR Screening     Status: None   Collection Time: 09/09/14  4:28 PM  Result Value Ref Range Status   MRSA by PCR NEGATIVE NEGATIVE Final    Comment:        The GeneXpert MRSA Assay (FDA approved for NASAL specimens only), is one component of a comprehensive MRSA colonization surveillance program. It is not intended to diagnose MRSA infection nor to guide or monitor treatment for MRSA infections.      Studies:   Recent x-ray studies have been reviewed in detail by the Attending Physician  Scheduled Meds:  Scheduled Meds: . carvedilol  6.25 mg Oral BID WC  . fluconazole (DIFLUCAN) IV  100 mg Intravenous Q24H  . folic acid  1 mg Intravenous Daily  . insulin aspart  0-9 Units Subcutaneous 6 times per day  .  levothyroxine  37.5 mcg Intravenous Daily  . piperacillin-tazobactam (ZOSYN)  IV  3.375 g Intravenous Q8H  . sodium chloride  3 mL Intravenous Q12H  . sodium chloride  3 mL Intravenous Q12H  . thiamine  100 mg Intravenous Daily  . tobramycin  2 drop Both Eyes 4 times per day  . vancomycin  750 mg Intravenous Q24H    Time spent on care of this patient: 35 mins   Paula Busenbark T , MD   [facility] Office  [phone] Pager - Text Page per Shea Evans as per below:  On-Call/Text Page:      [email or web address]      password [email or web address]  If 7PM-7AM, please contact night-coverage [email or web address] Password [email or web address] 09/10/2014, 2:05 PM   LOS: 1 day

## 2014-09-10 NOTE — Progress Notes (Signed)
Patient Name: [name] Date of Encounter: 09/10/2014  Principal Problem:   Fever Active Problems:   Carotid artery disease   Automatic implantable cardioverter-defibrillator in situ   Atrial flutter   COPD, severe   Chronic systolic CHF (congestive heart failure), NYHA class 4   Type 2 diabetes mellitus, uncontrolled   CKD (chronic kidney disease) stage 3, GFR 30-59 ml/min   Iron deficiency anemia due to chronic blood loss   Fatigue   Candida esophagitis   Chronic hyponatremia   Metabolic encephalopathy   Length of Stay: 1  SUBJECTIVE  Much more alert, oriented, but does not remember yesterday's events and is "still not himself" according to his wife. WBC increased further.  CURRENT MEDS . fluconazole (DIFLUCAN) IV  100 mg Intravenous Q24H  . folic acid  1 mg Intravenous Daily  . insulin aspart  0-9 Units Subcutaneous 6 times per day  . levothyroxine  37.5 mcg Intravenous Daily  . piperacillin-tazobactam (ZOSYN)  IV  3.375 g Intravenous Q8H  . sodium chloride  3 mL Intravenous Q12H  . sodium chloride  3 mL Intravenous Q12H  . thiamine  100 mg Intravenous Daily  . tobramycin  2 drop Both Eyes 4 times per day  . vancomycin  750 mg Intravenous Q24H    OBJECTIVE   Intake/Output Summary (Last 24 hours) at 09/10/14 1031 Last data filed at 09/10/14 0800  Gross per 24 hour  Intake  275.5 ml  Output    675 ml  Net -399.5 ml   Filed Weights   09/09/14 1633 09/10/14 0400  Weight: 147 lb 7.8 oz (66.9 kg) 148 lb 13 oz (67.5 kg)    PHYSICAL EXAM Filed Vitals:   09/09/14 2258 09/10/14 0400 09/10/14 0700 09/10/14 0800  BP: 126/53 101/47 104/40 118/43  Pulse:  77 73 76  Temp: 98.5 F (36.9 C) 100.9 F (38.3 C)  99.8 F (37.7 C)  TempSrc: Oral Oral  Oral  Resp:  17 15 22   Height:      Weight:  148 lb 13 oz (67.5 kg)    SpO2:  98% 100% 100%   General: alert, oriented x3, no acute distress. Warm extremities Head: no evidence of trauma, PERRL, EOMI, no  exophtalmos or lid lag, no myxedema, no xanthelasma; normal ears, nose and oropharynx Neck: 8-9 cm jugular venous pulsations ; brisk carotid pulses without delay and no carotid bruits Chest: clear to auscultation but diffusely diminished breath sounds, no signs of consolidation by percussion or palpation, normal fremitus, symmetrical and full respiratory excursions Cardiovascular: normal position and quality of the apical impulse, regular rhythm, distant first heart sound, split second heart sound, no rubs or gallops, 1-2/6 apical holosystolic murmur Abdomen: no tenderness or distention, no masses by palpation, no abnormal pulsatility or arterial bruits, normal bowel sounds, no hepatosplenomegaly Extremities: no clubbing, cyanosis; no edema; 2+ radial, ulnar and brachial pulses bilaterally; 2+ right femoral, posterior tibial and dorsalis pedis pulses; 2+ left femoral, posterior tibial and dorsalis pedis pulses; no subclavian or femoral bruits Neurological: grossly nonfocal  LABS  CBC  Recent Labs  09/09/14 1723 09/10/14 0110  WBC 11.9* 16.0*  NEUTROABS 9.3*  --   HGB 9.8* 10.5*  HCT 29.7* 31.9*  MCV 88.4 89.6  PLT 142* 235   Basic Metabolic Panel  Recent Labs  09/09/14 1723 09/10/14 0558  NA 129* 129*  K 3.5 3.7  CL 94* 94*  CO2 25 22  GLUCOSE 120* 135*  BUN 27* 25*  CREATININE  1.99* 2.02*  CALCIUM 7.5* 7.1*   Liver Function Tests  Recent Labs  09/09/14 1723 09/10/14 0558  AST 51* 64*  ALT 21 21  ALKPHOS 101 118*  BILITOT 2.6* 2.1*  PROT 5.4* 5.3*  ALBUMIN 1.9* 1.9*    Radiology Studies Imaging results have been reviewed and Dg Chest 2 View  09/10/2014   CLINICAL DATA:  Fever of unknown origin.  EXAM: CHEST  2 VIEW  COMPARISON:  09/09/2014  FINDINGS: The cardiac silhouette, mediastinal and hilar contours are stable. Stable surgical changes from bypass surgery. The pacer wires/ AICD are stable. There are chronic lung changes with calcified pleural plaques  bilaterally. Increasing airspace opacity in the right upper lobe is suspicious for pneumonia. No pleural effusions.  IMPRESSION: Chronic lung changes with calcified pleural plaques.  Slightly progressive airspace opacity in the right upper lobe suspicious for pneumonia.   Electronically Signed   By: Marijo Sanes M.D.   On: 09/10/2014 07:51   Dg Chest 2 View  09/09/2014   CLINICAL DATA:  Pt states weakness x 2 days. Denies any CP, SOB, cough, fever, or dizziness. H/o quadrupl bypass surgery 20+ years ago, defibrillator placed in 2006, h/o COPD, diabetes  EXAM: CHEST  2 VIEW  COMPARISON:  08/31/2014  FINDINGS: Changes from CABG surgery are stable. Cardiac silhouette is mildly enlarged. No mediastinal or hilar masses. Left anterior chest wall AICD is stable all position.  Lungs are hyperexpanded. There are mildly thickened interstitial markings bilaterally. No lung consolidation or edema. No pleural effusion or pneumothorax.  Bony thorax is demineralized but grossly intact.  IMPRESSION: No acute cardiopulmonary disease.   Electronically Signed   By: Lajean Manes M.D.   On: 09/09/2014 12:43   Dg Chest Port 1 View  09/09/2014   CLINICAL DATA:  Patient with acute onset fever.  Sepsis.  EXAM: PORTABLE CHEST - 1 VIEW  COMPARISON:  Chest radiograph 09/09/2014  FINDINGS: Multi lead AICD device overlies the left hemi thorax, leads appear stable in position. Stable enlarged cardiac and mediastinal contours status post median sternotomy and CABG procedure. Heterogeneous opacities are demonstrated throughout the left mid lung and right mid lung. No definite pleural effusion or pneumothorax. Calcified pleural plaques.  IMPRESSION: Bilateral left-greater-than-right mid lung heterogeneous opacities may represent infection in the appropriate clinical setting. A portion of these opacities may be secondary to calcified pleural plaques. Recommend continued radiographic followup.   Electronically Signed   By: Lovey Newcomer M.D.    On: 09/09/2014 16:58    TELE NSR, LBBB  ECG NSR, very broad LBBB  ASSESSMENT AND PLAN  No overt CHF, much more alert. Suspect infection is cause of encephalopathy, but source is unclear. May need TEE if he has bacteremia or if another source of infection is not identified. Can gradually reintroduce cardiac meds. Will start with carvedilol. Can switch back to eliquis unless primary team anticipates need for invasive procedures.   Sanda Klein, MD, [facility] [phone] office [phone] pager 09/10/2014 10:31 AM

## 2014-09-10 NOTE — Progress Notes (Signed)
ANTICOAGULATION CONSULT NOTE - Follow Up Consult  Pharmacy Consult for Heparin (Apixaban on hold) Indication: atrial fibrillation  Allergies  Allergen Reactions  . Niacin Itching    Niaspan     Patient Measurements: Height: 5\' 7"  (170.2 cm) Weight: 147 lb 7.8 oz (66.9 kg) IBW/kg (Calculated) : 66.1  Vital Signs: Temp: 98.5 F (36.9 C) (04/16 2258) Temp Source: Oral (04/16 2258) BP: 126/53 mmHg (04/16 2258) Pulse Rate: 66 (04/16 1900)  Labs:  Recent Labs  09/09/14 1150 09/09/14 1510 09/09/14 1723 09/10/14 0110  HGB 10.8*  --  9.8* 10.5*  HCT 33.5*  --  29.7* 31.9*  PLT 160  --  142* 166  APTT  --   --  47* 58*  LABPROT  --   --  19.8* 18.4*  INR  --   --  1.66* 1.51*  HEPARINUNFRC  --  1.66*  --  1.40*  CREATININE 2.05*  --  1.99*  --     Estimated Creatinine Clearance: 29.1 mL/min (by C-G formula based on Cr of 1.99).   Assessment: Sub-therapeutic aPTT (using aPTT to guide dosing for now given Apixaban influence on the heparin level). No issues per RN.   Goal of Therapy:  Heparin level 0.3-0.7 units/ml aPTT 66-102 seconds Monitor platelets by anticoagulation protocol: Yes   Plan:  -Increase heparin drip to 1200 units/hr -1100 aPTT -Daily CBC/HL/aPTT -Monitor for bleeding  Narda Bonds 09/10/2014,2:55 AM

## 2014-09-10 NOTE — Progress Notes (Addendum)
ANTICOAGULATION CONSULT NOTE - Follow Up Consult  Pharmacy Consult for Heparin (Apixaban on hold) Indication: atrial fibrillation  Allergies  Allergen Reactions  . Niacin Itching    Niaspan     Patient Measurements: Height: 5\' 7"  (170.2 cm) Weight: 148 lb 13 oz (67.5 kg) IBW/kg (Calculated) : 66.1  Vital Signs: Temp: 98.4 F (36.9 C) (04/17 1200) Temp Source: Oral (04/17 1200) BP: 104/44 mmHg (04/17 1200) Pulse Rate: 70 (04/17 1200)  Labs:  Recent Labs  09/09/14 1150 09/09/14 1510 09/09/14 1723 09/10/14 0110 09/10/14 0558 09/10/14 1043  HGB 10.8*  --  9.8* 10.5*  --   --   HCT 33.5*  --  29.7* 31.9*  --   --   PLT 160  --  142* 166  --   --   APTT  --   --  47* 58*  --  134*  LABPROT  --   --  19.8* 18.4*  --   --   INR  --   --  1.66* 1.51*  --   --   HEPARINUNFRC  --  1.66*  --  1.40*  --   --   CREATININE 2.05*  --  1.99*  --  2.02*  --     Estimated Creatinine Clearance: 28.6 mL/min (by C-G formula based on Cr of 2.02).   Assessment: 73 yom presented to the ED with fever, fatigue and AMS. To start IV heparin for afib. Pt was on eliquis PTA with last dose yesterday. This will be on hold d/t to pts AMS. Baseline H/H slightly low and platelets are WNL.   Using aPTT to guide dosing for now given Apixaban influence on the heparin level.  Current PTT above goal at 134.  No bleeding or complications noted.  Goal of Therapy:  Heparin level 0.3-0.7 units/ml aPTT 66-102 seconds Monitor platelets by anticoagulation protocol: Yes   Plan:  -Decrease heparin gtt to 1050 units/hr. -Recheck heparin level in 8 hrs. -Daily CBC/HL/aPTT -Monitor for bleeding -Consider converting back to apixaban today?  Uvaldo Rising, BCPS  Clinical Pharmacist Pager [phone]  09/10/2014 12:23 PM   Addendum: Pharmacy asked to change back to apixaban today.  PTA dose was 2.5 mg po BID.  Technically could be on 5 mg BID, but age approaching 74 yrs, and wt not far from  60 kg, will leave on lower dosing for now.  Uvaldo Rising, BCPS  Clinical Pharmacist Pager [phone]  09/10/2014 2:36 PM

## 2014-09-11 ENCOUNTER — Inpatient Hospital Stay (HOSPITAL_COMMUNITY): Payer: Medicare Other

## 2014-09-11 DIAGNOSIS — J189 Pneumonia, unspecified organism: Secondary | ICD-10-CM | POA: Diagnosis present

## 2014-09-11 LAB — BASIC METABOLIC PANEL
Anion gap: 10 (ref 5–15)
BUN: 25 mg/dL — AB (ref 6–23)
CO2: 26 mmol/L (ref 19–32)
CREATININE: 2.15 mg/dL — AB (ref 0.50–1.35)
Calcium: 7.2 mg/dL — ABNORMAL LOW (ref 8.4–10.5)
Chloride: 92 mmol/L — ABNORMAL LOW (ref 96–112)
GFR calc Af Amer: 32 mL/min — ABNORMAL LOW (ref >90)
GFR calc non Af Amer: 28 mL/min — ABNORMAL LOW (ref >90)
Glucose, Bld: 108 mg/dL — ABNORMAL HIGH (ref 70–99)
Potassium: 3.4 mmol/L — ABNORMAL LOW (ref 3.5–5.1)
Sodium: 128 mmol/L — ABNORMAL LOW (ref 135–145)

## 2014-09-11 LAB — CBC
HEMATOCRIT: 28.4 % — AB (ref 39.0–52.0)
HEMOGLOBIN: 9.5 g/dL — AB (ref 13.0–17.0)
MCH: 29.3 pg (ref 26.0–34.0)
MCHC: 33.5 g/dL (ref 30.0–36.0)
MCV: 87.7 fL (ref 78.0–100.0)
Platelets: 153 10*3/uL (ref 150–400)
RBC: 3.24 MIL/uL — AB (ref 4.22–5.81)
RDW: 18.9 % — ABNORMAL HIGH (ref 11.5–15.5)
WBC: 11.4 10*3/uL — ABNORMAL HIGH (ref 4.0–10.5)

## 2014-09-11 LAB — GLUCOSE, CAPILLARY
GLUCOSE-CAPILLARY: 153 mg/dL — AB (ref 70–99)
Glucose-Capillary: 89 mg/dL (ref 70–99)
Glucose-Capillary: 138 mg/dL — ABNORMAL HIGH (ref 70–99)

## 2014-09-11 LAB — URINE CULTURE
COLONY COUNT: NO GROWTH
CULTURE: NO GROWTH

## 2014-09-11 LAB — HEPATIC FUNCTION PANEL
ALBUMIN: 1.7 g/dL — AB (ref 3.5–5.2)
ALK PHOS: 107 U/L (ref 39–117)
ALT: 19 U/L (ref 0–53)
AST: 54 U/L — ABNORMAL HIGH (ref 0–37)
BILIRUBIN DIRECT: 1 mg/dL — AB (ref 0.0–0.5)
BILIRUBIN TOTAL: 1.6 mg/dL — AB (ref 0.3–1.2)
Indirect Bilirubin: 0.6 mg/dL (ref 0.3–0.9)
Total Protein: 5.2 g/dL — ABNORMAL LOW (ref 6.0–8.3)

## 2014-09-11 LAB — RETICULOCYTES
RBC.: 3.24 MIL/uL — ABNORMAL LOW (ref 4.22–5.81)
Retic Count, Absolute: 149 10*3/uL (ref 19.0–186.0)
Retic Ct Pct: 4.6 % — ABNORMAL HIGH (ref 0.4–3.1)

## 2014-09-11 LAB — FERRITIN: Ferritin: 567 ng/mL — ABNORMAL HIGH (ref 22–322)

## 2014-09-11 LAB — PROCALCITONIN: PROCALCITONIN: 2.52 ng/mL

## 2014-09-11 LAB — IRON AND TIBC
Iron: 20 ug/dL — ABNORMAL LOW (ref 42–165)
Saturation Ratios: 13 % — ABNORMAL LOW (ref 20–55)
TIBC: 160 ug/dL — ABNORMAL LOW (ref 215–435)
UIBC: 140 ug/dL (ref 125–400)

## 2014-09-11 LAB — AMMONIA: Ammonia: 31 umol/L (ref 11–32)

## 2014-09-11 LAB — HEMOGLOBIN A1C
Hgb A1c MFr Bld: 5.3 % (ref 4.8–5.6)
Mean Plasma Glucose: 105 mg/dL

## 2014-09-11 LAB — VITAMIN B12: Vitamin B-12: 521 pg/mL (ref 211–911)

## 2014-09-11 LAB — CLOSTRIDIUM DIFFICILE BY PCR: Toxigenic C. Difficile by PCR: NEGATIVE

## 2014-09-11 LAB — FOLATE: Folate: 8.5 ng/mL

## 2014-09-11 MED ORDER — APIXABAN 5 MG PO TABS
5.0000 mg | ORAL_TABLET | Freq: Two times a day (BID) | ORAL | Status: DC
  Administered 2014-09-11 – 2014-09-14 (×6): 5 mg via ORAL
  Filled 2014-09-11 (×7): qty 1

## 2014-09-11 MED ORDER — AMIODARONE HCL 200 MG PO TABS
200.0000 mg | ORAL_TABLET | Freq: Every day | ORAL | Status: DC
  Administered 2014-09-11 – 2014-09-14 (×4): 200 mg via ORAL
  Filled 2014-09-11 (×4): qty 1

## 2014-09-11 MED ORDER — POTASSIUM CHLORIDE CRYS ER 20 MEQ PO TBCR
40.0000 meq | EXTENDED_RELEASE_TABLET | Freq: Once | ORAL | Status: AC
  Administered 2014-09-11: 40 meq via ORAL
  Filled 2014-09-11: qty 2

## 2014-09-11 NOTE — Care Management Note (Addendum)
    Page 1 of 2   09/14/2014     11:16:25 AM CARE MANAGEMENT NOTE 09/14/2014  Patient:  [name], [name]   Account Number:  [id number]  Date Initiated:  09/11/2014  Documentation initiated by:  Elissa Hefty  Subjective/Objective Assessment:   adm w sepsis     Action/Plan:   lives w wife, pcp dr Gwenyth Bouillon mcgowan   Anticipated DC Date:  09/14/2014   Anticipated DC Plan:  [place]  CM consult      PAC Choice  [place]   Choice offered to / List presented to:  C-1 Patient   DME arranged  [place]      DME agency  [place] arranged  HH-1 RN  [place].   Status of service:  Completed, signed off Medicare Important Message given?  YES (If response is "NO", the following Medicare IM given date fields will be blank) Date Medicare IM given:  09/12/2014 Medicare IM given by:  Elissa Hefty Date Additional Medicare IM given:   Additional Medicare IM given by:    Discharge Disposition:  [place]  Per UR Regulation:  Reviewed for med. necessity/level of care/duration of stay  If discussed at [place] of Stay Meetings, dates discussed:   09/14/2014    Comments:  4/21 0931 debbie dowell rn,bsn spoke w pt and wife. went over hhc agency list. they would like to use [facility] for [facility]hc. ref to [facility] w [facility]. they state ther rec rolator-rolling walker w seat. order placed. may need home o2 await md orders.

## 2014-09-11 NOTE — Evaluation (Signed)
Physical Therapy Evaluation Patient Details Name: [name] MRN: [id number] DOB: 10-02-1935 Today's Date: 09/11/2014   History of Present Illness  [name] is a 79 yo male with a history of HTN, CAD s/p CABG, ICM s/p [employer] ICD, chronic systolic heart failure, DM2, Vtach, atrial flutter, PVD and GI mass. EF 20-25%, Mod-severe MR. Pt readmitted with fever and encephalopathy after D/C 4/12 and readmit 4/16  Clinical Impression  Pt familiar from last admission and was just seen by this therapist 4/11. Pt with marked difference in demeanor and activity tolerance today. Pt unable to ambulate in hallway after walking 550' easily a week ago, flat affect, flexed trunk and posture with difficulty following 2 step simple commands for mobility. Pt reports progressive decline after return home with inability to walk for the last few days. Given pt current deficits and marked decline recommend CIR to return pt to PLOF of just a week ago. Pt will benefit from acute therapy to maximize mobility, strength, function and activity to decrease burden of care and fall risk. Pt with loose stool with assist for pericare at [facility]. Recommend daily mobility with nursing assist.    Follow Up Recommendations CIR    Equipment Recommendations  Rolling walker with 5" wheels (rollator)    Recommendations for Other Services Rehab consult;OT consult     Precautions / Restrictions Precautions Precautions: Fall      Mobility  Bed Mobility Overal bed mobility: Needs Assistance Bed Mobility: Supine to Sit     Supine to sit: Supervision     General bed mobility comments: cues for sequence and safety  Transfers Overall transfer level: Needs assistance   Transfers: Sit to/from Stand Sit to Stand: Min guard         General transfer comment: cues for hand placement  Ambulation/Gait Ambulation/Gait assistance: Min guard Ambulation Distance (Feet): 15 Feet (15' then 49' with seated rest for  toileting) Assistive device: Rolling walker (2 wheeled) Gait Pattern/deviations: Step-through pattern;Decreased stride length;Trunk flexed   Gait velocity interpretation: Below normal speed for age/gender General Gait Details: cues for posture and position in RW  Stairs            Wheelchair Mobility    Modified Rankin (Stroke Patients Only)       Balance Overall balance assessment: Needs assistance   Sitting balance-Leahy Scale: Fair       Standing balance-Leahy Scale: Poor                               Pertinent Vitals/Pain Pain Assessment: No/denies pain  HR 85 sats 93% on 2L BP 101/42 (62)    Home Living Family/patient expects to be discharged to:: Private residence Living Arrangements: Spouse/significant other Available Help at Discharge: Family;Available 24 hours/day Type of Home: House Home Access: Stairs to enter   [employer] of Steps: 2 Home Layout: One level Home Equipment: None      Prior Function Level of Independence: Independent               Hand Dominance        Extremity/Trunk Assessment   Upper Extremity Assessment: Overall WFL for tasks assessed           Lower Extremity Assessment: Generalized weakness      Cervical / Trunk Assessment: Normal  Communication   Communication: No difficulties  Cognition Arousal/Alertness: Awake/alert Behavior During Therapy: WFL for tasks assessed/performed Overall Cognitive Status: Within Functional  Limits for tasks assessed                      General Comments      Exercises        Assessment/Plan    PT Assessment Patient needs continued PT services  PT Diagnosis Difficulty walking;Generalized weakness   PT Problem List Decreased strength;Decreased activity tolerance;Decreased mobility;Decreased balance;Decreased knowledge of use of DME  PT Treatment Interventions Gait training;DME instruction;Functional mobility training;Therapeutic  activities;Therapeutic exercise;Balance training;Patient/family education   PT Goals (Current goals can be found in the Care Plan section) Acute Rehab PT Goals Patient Stated Goal: be able to return home PT Goal Formulation: With patient Time For Goal Achievement: 09/25/14 Potential to Achieve Goals: Good    Frequency Min 3X/week   Barriers to discharge        Co-evaluation               End of Session Equipment Utilized During Treatment: Oxygen Activity Tolerance: Patient limited by fatigue Patient left: in chair;with call bell/phone within reach Nurse Communication: Mobility status         Time: 1223-1247 PT Time Calculation (min) (ACUTE ONLY): 24 min   Charges:   PT Evaluation $Initial PT Evaluation Tier I: 1 Procedure PT Treatments $Therapeutic Activity: 8-22 mins   PT G Codes:        Melford Aase 09/11/2014, 1:00 PM Elwyn Reach, [place]

## 2014-09-11 NOTE — Progress Notes (Addendum)
Advanced Heart Failure Rounding Note   Subjective:    [name] is a 79 yo male with a history of HTN, CAD s/p CABG, ICM s/p [employer] ICD, chronic systolic heart failure, DM2, Vtach, atrial flutter, PVD and GI mass. ECHO (12/22/13) EF 20-25%, Echo EF 40% (12/15) Mod-severe MR.   Most recent admit with fatigue and dyspnea. Found to be anemic. Received 2UPRBCs. Had EGD that showed candidal esophagitis. Discharged on diflucan.   Admitted April 16th with fever and hypotension.   Blood cultures --> NGTD.  Urine Culture Negative    Objective:   Weight Range:  Vital Signs:   Temp:  [98.4 F (36.9 C)-100.4 F (38 C)] 98.4 F (36.9 C) (04/18 0400) Pulse Rate:  [61-76] 64 (04/18 0700) Resp:  [15-27] 17 (04/18 0700) BP: (73-118)/(31-50) 92/43 mmHg (04/18 0700) SpO2:  [93 %-100 %] 100 % (04/18 0700) Weight:  [149 lb 7.6 oz (67.8 kg)] 149 lb 7.6 oz (67.8 kg) (04/18 0500) Last BM Date: 09/09/14  Weight change: Filed Weights   09/09/14 1633 09/10/14 0400 09/11/14 0500  Weight: 147 lb 7.8 oz (66.9 kg) 148 lb 13 oz (67.5 kg) 149 lb 7.6 oz (67.8 kg)    Intake/Output:   Intake/Output Summary (Last 24 hours) at 09/11/14 0748 Last data filed at 09/11/14 0547  Gross per 24 hour  Intake  743.5 ml  Output    625 ml  Net  118.5 ml     Physical Exam: General: Chronically ill appearing. No resp difficulty. Marland Kitchen  HEENT: normal Neck: supple. JVP 5-6 Carotids 2+ bilaterally; no bruits. No lymphadenopathy or thryomegaly appreciated. RIJ  Cor: PMI normal. Regular rate & rhythm. No rubs, gallops. 2/6 HSM apex.  Lungs: Occasional wheezes. Abdomen: soft, nontender, nondistended. No hepatosplenomegaly. No bruits or masses. Good bowel sounds. Extremities: no cyanosis, clubbing, rash, .  Neuro: alert & orientedx3, cranial nerves grossly intact. Moves all 4 extremities w/o difficulty. Affect pleasant.  Telemetry: SR 60s   Labs: Basic Metabolic Panel:  Recent Labs Lab 09/05/14 0536  09/09/14 1150 09/09/14 1723 09/10/14 0558 09/11/14 0216  NA 134* 129* 129* 129* 128*  K 4.4 3.6 3.5 3.7 3.4*  CL 91* 92* 94* 94* 92*  CO2 35* 25 25 22 26   GLUCOSE 164* 91 120* 135* 108*  BUN 32* 27* 27* 25* 25*  CREATININE 2.23* 2.05* 1.99* 2.02* 2.15*  CALCIUM 8.2* 7.8* 7.5* 7.1* 7.2*    Liver Function Tests:  Recent Labs Lab 09/09/14 1150 09/09/14 1723 09/10/14 0558 09/11/14 0216  AST 65* 51* 64* 54*  ALT 25 21 21 19   ALKPHOS 124* 101 118* 107  BILITOT 2.8* 2.6* 2.1* 1.6*  PROT 6.4 5.4* 5.3* 5.2*  ALBUMIN 2.3* 1.9* 1.9* 1.7*   No results for input(s): LIPASE, AMYLASE in the last 168 hours.  Recent Labs Lab 09/09/14 1515 09/11/14 0216  AMMONIA 49* 31    CBC:  Recent Labs Lab 09/05/14 0536 09/09/14 1150 09/09/14 1723 09/10/14 0110 09/11/14 0216  WBC 12.4* 12.6* 11.9* 16.0* 11.4*  NEUTROABS  --   --  9.3*  --   --   HGB 9.5* 10.8* 9.8* 10.5* 9.5*  HCT 29.1* 33.5* 29.7* 31.9* 28.4*  MCV 86.6 89.3 88.4 89.6 87.7  PLT 123* 160 142* 166 153    Cardiac Enzymes: No results for input(s): CKTOTAL, CKMB, CKMBINDEX, TROPONINI in the last 168 hours.  BNP: BNP (last 3 results)  Recent Labs  06/07/14 1421 08/30/14 1639 09/09/14 1150  BNP 528.4* 800.7* 550.7*  ProBNP (last 3 results)  Recent Labs  01/24/14 1909 02/09/14 0945  PROBNP 6721.0* 2555.0*      Other results:  Imaging: Dg Chest 2 View  09/10/2014   CLINICAL DATA:  Fever of unknown origin.  EXAM: CHEST  2 VIEW  COMPARISON:  09/09/2014  FINDINGS: The cardiac silhouette, mediastinal and hilar contours are stable. Stable surgical changes from bypass surgery. The pacer wires/ AICD are stable. There are chronic lung changes with calcified pleural plaques bilaterally. Increasing airspace opacity in the right upper lobe is suspicious for pneumonia. No pleural effusions.  IMPRESSION: Chronic lung changes with calcified pleural plaques.  Slightly progressive airspace opacity in the right upper  lobe suspicious for pneumonia.   Electronically Signed   By: Marijo Sanes M.D.   On: 09/10/2014 07:51   Dg Chest 2 View  09/09/2014   CLINICAL DATA:  Pt states weakness x 2 days. Denies any CP, SOB, cough, fever, or dizziness. H/o quadrupl bypass surgery 20+ years ago, defibrillator placed in 2006, h/o COPD, diabetes  EXAM: CHEST  2 VIEW  COMPARISON:  08/31/2014  FINDINGS: Changes from CABG surgery are stable. Cardiac silhouette is mildly enlarged. No mediastinal or hilar masses. Left anterior chest wall AICD is stable all position.  Lungs are hyperexpanded. There are mildly thickened interstitial markings bilaterally. No lung consolidation or edema. No pleural effusion or pneumothorax.  Bony thorax is demineralized but grossly intact.  IMPRESSION: No acute cardiopulmonary disease.   Electronically Signed   By: Lajean Manes M.D.   On: 09/09/2014 12:43   Dg Chest Port 1 View  09/11/2014   CLINICAL DATA:  CHF, pneumonia, history of CABG, chronic lung disease  EXAM: PORTABLE CHEST - 1 VIEW  COMPARISON:  tion PA and lateral chest of September 10, 2014  FINDINGS: The lungs remain hyperinflated. The interstitial markings remain coarse bilaterally. Confluent density in the right upper lobe persists but has not progressed significantly since the previous study. Pleural plaques are present bilaterally. There is no significant pleural effusion, and there is no pneumothorax. The cardiac silhouette is enlarged. The permanent pacemaker defibrillator is unchanged in position. The pulmonary vascularity is not engorged. There are 8 intact sternal wires.  IMPRESSION: Airspace opacity in the right upper lobe suggests pneumonia. There is underlying chronic interstitial lung disease with bilateral calcified pleural plaques. There is no evidence of CHF currently.   Electronically Signed   By: David  Martinique   On: 09/11/2014 07:18   Dg Chest Port 1 View  09/09/2014   CLINICAL DATA:  Patient with acute onset fever.  Sepsis.  EXAM:  PORTABLE CHEST - 1 VIEW  COMPARISON:  Chest radiograph 09/09/2014  FINDINGS: Multi lead AICD device overlies the left hemi thorax, leads appear stable in position. Stable enlarged cardiac and mediastinal contours status post median sternotomy and CABG procedure. Heterogeneous opacities are demonstrated throughout the left mid lung and right mid lung. No definite pleural effusion or pneumothorax. Calcified pleural plaques.  IMPRESSION: Bilateral left-greater-than-right mid lung heterogeneous opacities may represent infection in the appropriate clinical setting. A portion of these opacities may be secondary to calcified pleural plaques. Recommend continued radiographic followup.   Electronically Signed   By: Lovey Newcomer M.D.   On: 09/09/2014 16:58      Medications:     Scheduled Medications: . apixaban  2.5 mg Oral BID  . carvedilol  6.25 mg Oral BID WC  . fluconazole (DIFLUCAN) IV  100 mg Intravenous Q24H  . folic acid  1  mg Oral Daily  . insulin aspart  0-9 Units Subcutaneous TID WC  . levothyroxine  75 mcg Oral QAC breakfast  . piperacillin-tazobactam (ZOSYN)  IV  3.375 g Intravenous Q8H  . thiamine  100 mg Oral Daily  . tobramycin  2 drop Both Eyes 4 times per day  . vancomycin  750 mg Intravenous Q24H     Infusions:     PRN Medications:  acetaminophen, ondansetron **OR** ondansetron (ZOFRAN) IV   Assessment:  1. Fever 2. Hypotension 3. Chronic Systolic HF- ICM- ECHO 11/27/9161 EF 40-45%  4. PAF on chronic amiodarone and eliquis 5. DM 6. H/O VT  7. Anemia  8. Chronic Respiratory Failure- on 2 liters [place] at home  9. Hypothroid 10. CKD   Plan/Discussion:    Admitted with fever and hypotension. Afebrile over night. WBC down form 16>11. Urine negative. Blood cultures NGTD. On zosyn and vanc. Will need TEE to further assess with ICD.   Remains hypotensive. Stop carvedilol. Has been off diuretics. Volume status stable.  Weight down 1 pound.    Remains on eliquis 2.5 mg twice  a day. Not sure why amiodarone was held. Will restart amio 200 mg daily .  EGD-last admit with esophageal candidasis. On Fluconazone.   Length of Stay: 2   CLEGG,AMY NP-C 09/11/2014, 7:48 AM  Advanced Heart Failure Team Pager [phone] (M-F; [place])  Please contact [facility] for night-coverage after hours (4p -7a ) and weekends on [email or web address]  Patient seen and examined with Darrick Grinder, NP. We discussed all aspects of the encounter. I agree with the assessment and plan as stated above.   Improving slowly. WBC down. UA and cx negative. Would continue broad spectrum abx for now. HF stable would hold lasix one more day.   Will need TEE when better to assess for infected leads.   PT to see to get him to chair. Appreciate [facility].   Clarified Code Status with patient anf family. Would want short-term resuscitation. Just would not want to be kept alive on machines long-term.   Daniel Bensimhon,MD 8:24 AM

## 2014-09-11 NOTE — Progress Notes (Signed)
Inpatient Rehabilitation  Patient was screened by Clare Fennimore for appropriateness for an Inpatient Acute Rehab consult.  At this time, we are recommending Inpatient Rehab consult.  Please order when you feel appropriate.   Gitty Osterlund PT Inpatient Rehab Admissions Coordinator Cell [phone] Office [phone]   

## 2014-09-11 NOTE — Progress Notes (Signed)
[place] TEAM 1 - Stepdown/ICU TEAM Progress Note  [name]:[id number] DOB: [birth year] DOA: 09/09/2014 PCP: Tammi Sou, MD  Admit HPI / Brief Narrative: 79 year old male discharged on 4/12 after admission for an acute systolic heart failure decompensation. During that admission patient had cardiogenic shock and required milrinone and norepinephrine. He also underwent right heart catheterization. He had anemia prompting an EGD that demonstrated esophageal candidiasis and was prescribed 10 days of fluconazole. His Lasix was changed to torsemide. He diuresed 12 pounds during that admission. Other medical problems include paroxysmal atrial fibrillation on amiodarone and eliquis, moderate to severe mitral regurgitation, chronic kidney disease stage III suspected cardiorenal etiology, diabetes mellitus on insulin, known CAD, COPD, history of ventricular tachycardia post ICD, and hypothyroidism.   On 4/16 the patient presented to the ER with reports of fatigue and fever.  He was listless and had a difficult time remaining awake to provide history. He denied diarrhea or abdominal pain. Patient had been prescribed Diflucan to take for the esophageal candidiasis but wife reports [facility] had not filled this prescription because they were concerned about drug drug interactions and had been unable to contact the prescribing provider for clarification.   Upon presentation to the ER the patient had a fever of 101.4. His blood pressure was 110/48. His room-air saturations were 97%. Chest x-ray and urinalysis were unremarkable. Sodium was slightly lower than baseline at 129. BUN 27 creatinine 2.05. Alkaline phosphatase 124, AST 65 and total bilirubin 2.8. Lactic acid was normal at 1.66 and troponin was normal 0.03. BNP was 550 noting his BNP on 4/6 was 800 and in the past has peaked as high as 6721.  HPI/Subjective: The patient is much more alert and interactive today.  He is oriented to place  time and situation.  He denies chest pain nausea vomiting or abdominal pain.  He does tell me he feels mildly short of breath at rest.  Assessment/Plan:  Newly appreciated right lung HCAP - Metabolic encephalopathy  Urinalysis unrevealing - initial chest x-ray unrevealing, but follow-up chest x-ray noted right upper lobe infiltrate which persists on today's x-ray - influenza screen negative - very mildly elevated ammonia at admission not likely significantly contributing - blood cultures negative thus far - will treat as HCAP for now and continue to follow  Transaminitis Very mild - acute viral hepatitis panel negative - steadily improving  Ischemic cardiomyopathy - severe systolic heart failure No evidence of acute exacerbation this time - discharge weight 4/12 was 68 kg - currently at 67.5 kg - CHF team following  Chronic hyponatremia Due to severe heart failure - stable   Paroxysmal atrial fibrillation On chronic amiodarone and eliquis   Mitral regurgitation - moderate to severe Not a candidate for surgical intervention per cardiology  Chronic kidney disease with baseline creatinine 2.4 - stage 3, GFR 30-59 ml/min Renal function is presently stable/better than baseline  Diabetes mellitus CBG currently well-controlled - continue to follow  Coronary artery disease status post CABG Cardiology following - no evidence of acute issues  COPD Well compensated at the present time  Hypothyroidism Continue home medical therapy  Recent esophageal candidiasis To complete Diflucan therapy - no evidence of worsening clinically - diagnosed via EGD last admission - did not complete Diflucan course as outpatient due to pharmacies concern for drug/drug interactions  Normocytic anemia Anemia panel most consistent with "anemia of chronic disease"  Code Status: FULL Family Communication: no family present at time of exam Disposition Plan: SDU  Consultants:  [facility]  cardiology  Procedures: None  Antibiotics: Zosyn 4/16 > Vancomycin 4/16 > Fluconazole 4/16 >  DVT prophylaxis: IV heparin > apixaban  Objective: Blood pressure 100/41, pulse 69, temperature 98.2 F (36.8 C), temperature source Oral, resp. rate 24, height 5\' 7"  (1.702 m), weight 67.8 kg (149 lb 7.6 oz), SpO2 100 %.  Intake/Output Summary (Last 24 hours) at 09/11/14 1152 Last data filed at 09/11/14 0547  Gross per 24 hour  Intake  719.5 ml  Output    625 ml  Net   94.5 ml   Exam: General: No acute respiratory distress - alert and oriented today Lungs: Clear to auscultation bilaterally  Cardiovascular: Regular rate without murmur gallop or rub appreciable Abdomen: Nontender, nondistended, soft, bowel sounds positive, no rebound, no ascites, no appreciable mass Extremities: No significant cyanosis, clubbing, or edema bilateral lower extremities  Data Reviewed: Basic Metabolic Panel:  Recent Labs Lab 09/05/14 0536 09/09/14 1150 09/09/14 1723 09/10/14 0558 09/11/14 0216  NA 134* 129* 129* 129* 128*  K 4.4 3.6 3.5 3.7 3.4*  CL 91* 92* 94* 94* 92*  CO2 35* 25 25 22 26   GLUCOSE 164* 91 120* 135* 108*  BUN 32* 27* 27* 25* 25*  CREATININE 2.23* 2.05* 1.99* 2.02* 2.15*  CALCIUM 8.2* 7.8* 7.5* 7.1* 7.2*    Liver Function Tests:  Recent Labs Lab 09/09/14 1150 09/09/14 1723 09/10/14 0558 09/11/14 0216  AST 65* 51* 64* 54*  ALT 25 21 21 19   ALKPHOS 124* 101 118* 107  BILITOT 2.8* 2.6* 2.1* 1.6*  PROT 6.4 5.4* 5.3* 5.2*  ALBUMIN 2.3* 1.9* 1.9* 1.7*    Recent Labs Lab 09/09/14 1515 09/11/14 0216  AMMONIA 49* 31   Coags:  Recent Labs Lab 09/09/14 1723 09/10/14 0110  INR 1.66* 1.51*    Recent Labs Lab 09/09/14 1723 09/10/14 0110 09/10/14 1043  APTT 47* 58* 134*    CBC:  Recent Labs Lab 09/05/14 0536 09/09/14 1150 09/09/14 1723 09/10/14 0110 09/11/14 0216  WBC 12.4* 12.6* 11.9* 16.0* 11.4*  NEUTROABS  --   --  9.3*  --   --   HGB 9.5*  10.8* 9.8* 10.5* 9.5*  HCT 29.1* 33.5* 29.7* 31.9* 28.4*  MCV 86.6 89.3 88.4 89.6 87.7  PLT 123* 160 142* 166 153    CBG:  Recent Labs Lab 09/10/14 0746 09/10/14 1200 09/10/14 1646 09/10/14 2207 09/11/14 0806  GLUCAP 123* 141* 174* 125* 89    Recent Results (from the past 240 hour(s))  Culture, blood (routine x 2)     Status: None (Preliminary result)   Collection Time: 09/09/14 11:55 AM  Result Value Ref Range Status   Specimen Description BLOOD RIGHT ARM  Final   Special Requests BOTTLES DRAWN AEROBIC AND ANAEROBIC 10 CC  Final   Culture   Final           BLOOD CULTURE RECEIVED NO GROWTH TO DATE CULTURE WILL BE HELD FOR 5 DAYS BEFORE ISSUING A FINAL NEGATIVE REPORT Performed at [facility]    Report Status PENDING  Incomplete  Culture, blood (routine x 2)     Status: None (Preliminary result)   Collection Time: 09/09/14 12:00 PM  Result Value Ref Range Status   Specimen Description BLOOD RIGHT HAND  Final   Special Requests BOTTLES DRAWN AEROBIC ONLY 5 CC  Final   Culture   Final           BLOOD CULTURE RECEIVED NO GROWTH TO DATE CULTURE WILL BE HELD  FOR 5 DAYS BEFORE ISSUING A FINAL NEGATIVE REPORT Performed at [facility]    Report Status PENDING  Incomplete  Urine culture     Status: None   Collection Time: 09/09/14  1:53 PM  Result Value Ref Range Status   Specimen Description URINE, CLEAN CATCH  Final   Special Requests NONE  Final   Colony Count NO GROWTH Performed at [facility]   Final   Culture NO GROWTH Performed at [facility]   Final   Report Status 09/11/2014 FINAL  Final  MRSA PCR Screening     Status: None   Collection Time: 09/09/14  4:28 PM  Result Value Ref Range Status   MRSA by PCR NEGATIVE NEGATIVE Final    Comment:        The GeneXpert MRSA Assay (FDA approved for NASAL specimens only), is one component of a comprehensive MRSA colonization surveillance program. It is not intended to diagnose  MRSA infection nor to guide or monitor treatment for MRSA infections.   Culture, blood (x 2)     Status: None (Preliminary result)   Collection Time: 09/09/14  5:17 PM  Result Value Ref Range Status   Specimen Description BLOOD CENTRAL LINE  Final   Special Requests BOTTLES DRAWN AEROBIC AND ANAEROBIC 5CC  Final   Culture   Final           BLOOD CULTURE RECEIVED NO GROWTH TO DATE CULTURE WILL BE HELD FOR 5 DAYS BEFORE ISSUING A FINAL NEGATIVE REPORT Performed at [facility]    Report Status PENDING  Incomplete  Culture, blood (x 2)     Status: None (Preliminary result)   Collection Time: 09/09/14  5:23 PM  Result Value Ref Range Status   Specimen Description BLOOD LEFT ANTECUBITAL  Final   Special Requests BOTTLES DRAWN AEROBIC AND ANAEROBIC 10CC  Final   Culture   Final           BLOOD CULTURE RECEIVED NO GROWTH TO DATE CULTURE WILL BE HELD FOR 5 DAYS BEFORE ISSUING A FINAL NEGATIVE REPORT Performed at [facility]    Report Status PENDING  Incomplete  Clostridium Difficile by PCR     Status: None   Collection Time: 09/11/14  5:23 AM  Result Value Ref Range Status   C difficile by pcr NEGATIVE NEGATIVE Final     Studies:   Recent x-ray studies have been reviewed in detail by the Attending Physician  Scheduled Meds:  Scheduled Meds: . amiodarone  200 mg Oral Daily  . apixaban  2.5 mg Oral BID  . fluconazole (DIFLUCAN) IV  100 mg Intravenous Q24H  . folic acid  1 mg Oral Daily  . insulin aspart  0-9 Units Subcutaneous TID WC  . levothyroxine  75 mcg Oral QAC breakfast  . piperacillin-tazobactam (ZOSYN)  IV  3.375 g Intravenous Q8H  . thiamine  100 mg Oral Daily  . tobramycin  2 drop Both Eyes 4 times per day  . vancomycin  750 mg Intravenous Q24H    Time spent on care of this patient: 35 mins   Annastacia Duba T , MD   [facility] Office  [phone] Pager - Text Page per Shea Evans as per below:  On-Call/Text Page:      [email or web address]       password [email or web address]  If 7PM-7AM, please contact night-coverage [email or web address] Password [email or web address] 09/11/2014, 11:52 AM   LOS: 2 days

## 2014-09-12 ENCOUNTER — Ambulatory Visit: Payer: Medicare Other | Admitting: Family Medicine

## 2014-09-12 DIAGNOSIS — G4733 Obstructive sleep apnea (adult) (pediatric): Secondary | ICD-10-CM | POA: Diagnosis present

## 2014-09-12 DIAGNOSIS — B3781 Candidal esophagitis: Secondary | ICD-10-CM

## 2014-09-12 DIAGNOSIS — J441 Chronic obstructive pulmonary disease with (acute) exacerbation: Secondary | ICD-10-CM | POA: Diagnosis present

## 2014-09-12 DIAGNOSIS — N179 Acute kidney failure, unspecified: Secondary | ICD-10-CM

## 2014-09-12 DIAGNOSIS — I255 Ischemic cardiomyopathy: Secondary | ICD-10-CM

## 2014-09-12 DIAGNOSIS — E038 Other specified hypothyroidism: Secondary | ICD-10-CM | POA: Diagnosis present

## 2014-09-12 DIAGNOSIS — D649 Anemia, unspecified: Secondary | ICD-10-CM | POA: Diagnosis present

## 2014-09-12 DIAGNOSIS — I480 Paroxysmal atrial fibrillation: Secondary | ICD-10-CM | POA: Diagnosis present

## 2014-09-12 DIAGNOSIS — R740 Nonspecific elevation of levels of transaminase and lactic acid dehydrogenase [LDH]: Secondary | ICD-10-CM

## 2014-09-12 DIAGNOSIS — R7401 Elevation of levels of liver transaminase levels: Secondary | ICD-10-CM | POA: Diagnosis present

## 2014-09-12 DIAGNOSIS — J189 Pneumonia, unspecified organism: Principal | ICD-10-CM

## 2014-09-12 DIAGNOSIS — E119 Type 2 diabetes mellitus without complications: Secondary | ICD-10-CM

## 2014-09-12 DIAGNOSIS — N189 Chronic kidney disease, unspecified: Secondary | ICD-10-CM

## 2014-09-12 DIAGNOSIS — I5022 Chronic systolic (congestive) heart failure: Secondary | ICD-10-CM | POA: Diagnosis present

## 2014-09-12 LAB — CBC
HEMATOCRIT: 27.8 % — AB (ref 39.0–52.0)
HEMOGLOBIN: 9.4 g/dL — AB (ref 13.0–17.0)
MCH: 29.5 pg (ref 26.0–34.0)
MCHC: 33.8 g/dL (ref 30.0–36.0)
MCV: 87.1 fL (ref 78.0–100.0)
Platelets: 148 10*3/uL — ABNORMAL LOW (ref 150–400)
RBC: 3.19 MIL/uL — AB (ref 4.22–5.81)
RDW: 18.7 % — ABNORMAL HIGH (ref 11.5–15.5)
WBC: 9.6 10*3/uL (ref 4.0–10.5)

## 2014-09-12 LAB — COMPREHENSIVE METABOLIC PANEL
ALBUMIN: 1.7 g/dL — AB (ref 3.5–5.2)
ALT: 19 U/L (ref 0–53)
AST: 47 U/L — ABNORMAL HIGH (ref 0–37)
Alkaline Phosphatase: 111 U/L (ref 39–117)
Anion gap: 10 (ref 5–15)
BILIRUBIN TOTAL: 1.6 mg/dL — AB (ref 0.3–1.2)
BUN: 25 mg/dL — ABNORMAL HIGH (ref 6–23)
CALCIUM: 7.4 mg/dL — AB (ref 8.4–10.5)
CO2: 25 mmol/L (ref 19–32)
CREATININE: 1.91 mg/dL — AB (ref 0.50–1.35)
Chloride: 92 mmol/L — ABNORMAL LOW (ref 96–112)
GFR calc Af Amer: 37 mL/min — ABNORMAL LOW (ref >90)
GFR calc non Af Amer: 32 mL/min — ABNORMAL LOW (ref >90)
Glucose, Bld: 115 mg/dL — ABNORMAL HIGH (ref 70–99)
Potassium: 3.8 mmol/L (ref 3.5–5.1)
SODIUM: 127 mmol/L — AB (ref 135–145)
Total Protein: 5.1 g/dL — ABNORMAL LOW (ref 6.0–8.3)

## 2014-09-12 LAB — GLUCOSE, CAPILLARY
GLUCOSE-CAPILLARY: 161 mg/dL — AB (ref 70–99)
GLUCOSE-CAPILLARY: 117 mg/dL — AB (ref 70–99)
Glucose-Capillary: 115 mg/dL — ABNORMAL HIGH (ref 70–99)
Glucose-Capillary: 146 mg/dL — ABNORMAL HIGH (ref 70–99)

## 2014-09-12 MED ORDER — DM-GUAIFENESIN ER 30-600 MG PO TB12
1.0000 | ORAL_TABLET | Freq: Two times a day (BID) | ORAL | Status: DC
  Administered 2014-09-12 – 2014-09-14 (×4): 1 via ORAL
  Filled 2014-09-12 (×5): qty 1

## 2014-09-12 MED ORDER — IPRATROPIUM-ALBUTEROL 0.5-2.5 (3) MG/3ML IN SOLN
3.0000 mL | Freq: Four times a day (QID) | RESPIRATORY_TRACT | Status: DC
  Administered 2014-09-12 – 2014-09-14 (×8): 3 mL via RESPIRATORY_TRACT
  Filled 2014-09-12 (×8): qty 3

## 2014-09-12 MED ORDER — TORSEMIDE 20 MG PO TABS
40.0000 mg | ORAL_TABLET | Freq: Every day | ORAL | Status: DC
  Administered 2014-09-12 – 2014-09-14 (×3): 40 mg via ORAL
  Filled 2014-09-12 (×3): qty 2

## 2014-09-12 NOTE — Progress Notes (Signed)
Noted pt with much improvement with therapy today. Their recommendation has changed to [facility] at d/c and not to need inpt rehab admission. [phone]

## 2014-09-12 NOTE — Progress Notes (Signed)
Pt has CPAP in the room, but denies using one, and does not need it at this time.

## 2014-09-12 NOTE — Progress Notes (Addendum)
Advanced Heart Failure Rounding Note   Subjective:    [name] is a 79 yo male with a history of HTN, CAD s/p CABG, ICM s/p Medtronic ICD, chronic systolic heart failure, DM2, Vtach, atrial flutter, PVD and GI mass. ECHO (12/22/13) EF 20-25%, Echo EF 40% (12/15) Mod-severe MR.   Most recent admit with fatigue and dyspnea. Found to be anemic. Received 2UPRBCs. Had EGD that showed candidal esophagitis. Discharged on diflucan.   Admitted April 16th with fever and hypotension. Found to have PNA.   Off diuretics and BB. Hemoglobin stable.   Complains of fatigue.   Blood cultures --> NGTD.  Urine Culture Negative  C diff- negative CXR : RUL PNA   Objective:   Weight Range:  Vital Signs:   Temp:  [98.2 F (36.8 C)-99.7 F (37.6 C)] 98.2 F (36.8 C) (04/19 0803) Pulse Rate:  [66-119] 81 (04/19 0803) Resp:  [14-26] 22 (04/19 0803) BP: (92-126)/(37-80) 125/42 mmHg (04/19 0803) SpO2:  [92 %-100 %] 100 % (04/19 0803) Weight:  [152 lb 1.9 oz (69 kg)] 152 lb 1.9 oz (69 kg) (04/19 0500) Last BM Date: 09/11/14  Weight change: Filed Weights   09/10/14 0400 09/11/14 0500 09/12/14 0500  Weight: 148 lb 13 oz (67.5 kg) 149 lb 7.6 oz (67.8 kg) 152 lb 1.9 oz (69 kg)    Intake/Output:   Intake/Output Summary (Last 24 hours) at 09/12/14 0809 Last data filed at 09/12/14 0700  Gross per 24 hour  Intake 1337.5 ml  Output   1000 ml  Net  337.5 ml     Physical Exam: General: Chronically ill appearing. No resp difficulty. Sitting in the chair.  HEENT: normal Neck: supple. JVP 5-6 Carotids 2+ bilaterally; no bruits. No lymphadenopathy or thryomegaly appreciated. RIJ  Cor: PMI normal. Regular rate & rhythm. No rubs, gallops. 2/6 HSM apex.  Lungs: clear Abdomen: soft, nontender, mildly distended. No hepatosplenomegaly. No bruits or masses. Good bowel sounds. Extremities: no cyanosis, clubbing, rash, . 2+ ankle edema Neuro: alert & orientedx3, cranial nerves grossly intact. Moves  all 4 extremities w/o difficulty. Affect pleasant.  Telemetry: SR 60-70s   Labs: Basic Metabolic Panel:  Recent Labs Lab 09/09/14 1150 09/09/14 1723 09/10/14 0558 09/11/14 0216 09/12/14 0410  NA 129* 129* 129* 128* 127*  K 3.6 3.5 3.7 3.4* 3.8  CL 92* 94* 94* 92* 92*  CO2 25 25 22 26 25   GLUCOSE 91 120* 135* 108* 115*  BUN 27* 27* 25* 25* 25*  CREATININE 2.05* 1.99* 2.02* 2.15* 1.91*  CALCIUM 7.8* 7.5* 7.1* 7.2* 7.4*    Liver Function Tests:  Recent Labs Lab 09/09/14 1150 09/09/14 1723 09/10/14 0558 09/11/14 0216 09/12/14 0410  AST 65* 51* 64* 54* 47*  ALT 25 21 21 19 19   ALKPHOS 124* 101 118* 107 111  BILITOT 2.8* 2.6* 2.1* 1.6* 1.6*  PROT 6.4 5.4* 5.3* 5.2* 5.1*  ALBUMIN 2.3* 1.9* 1.9* 1.7* 1.7*   No results for input(s): LIPASE, AMYLASE in the last 168 hours.  Recent Labs Lab 09/09/14 1515 09/11/14 0216  AMMONIA 49* 31    CBC:  Recent Labs Lab 09/09/14 1150 09/09/14 1723 09/10/14 0110 09/11/14 0216 09/12/14 0410  WBC 12.6* 11.9* 16.0* 11.4* 9.6  NEUTROABS  --  9.3*  --   --   --   HGB 10.8* 9.8* 10.5* 9.5* 9.4*  HCT 33.5* 29.7* 31.9* 28.4* 27.8*  MCV 89.3 88.4 89.6 87.7 87.1  PLT 160 142* 166 153 148*    Cardiac Enzymes:  No results for input(s): CKTOTAL, CKMB, CKMBINDEX, TROPONINI in the last 168 hours.  BNP: BNP (last 3 results)  Recent Labs  06/07/14 1421 08/30/14 1639 09/09/14 1150  BNP 528.4* 800.7* 550.7*    ProBNP (last 3 results)  Recent Labs  01/24/14 1909 02/09/14 0945  PROBNP 6721.0* 2555.0*      Other results:  Imaging: Dg Chest Port 1 View  09/11/2014   CLINICAL DATA:  CHF, pneumonia, history of CABG, chronic lung disease  EXAM: PORTABLE CHEST - 1 VIEW  COMPARISON:  tion PA and lateral chest of September 10, 2014  FINDINGS: The lungs remain hyperinflated. The interstitial markings remain coarse bilaterally. Confluent density in the right upper lobe persists but has not progressed significantly since the previous  study. Pleural plaques are present bilaterally. There is no significant pleural effusion, and there is no pneumothorax. The cardiac silhouette is enlarged. The permanent pacemaker defibrillator is unchanged in position. The pulmonary vascularity is not engorged. There are 8 intact sternal wires.  IMPRESSION: Airspace opacity in the right upper lobe suggests pneumonia. There is underlying chronic interstitial lung disease with bilateral calcified pleural plaques. There is no evidence of CHF currently.   Electronically Signed   By: David  Martinique   On: 09/11/2014 07:18     Medications:     Scheduled Medications: . amiodarone  200 mg Oral Daily  . apixaban  5 mg Oral BID  . fluconazole (DIFLUCAN) IV  100 mg Intravenous Q24H  . folic acid  1 mg Oral Daily  . insulin aspart  0-9 Units Subcutaneous TID WC  . levothyroxine  75 mcg Oral QAC breakfast  . piperacillin-tazobactam (ZOSYN)  IV  3.375 g Intravenous Q8H  . thiamine  100 mg Oral Daily  . tobramycin  2 drop Both Eyes 4 times per day  . vancomycin  750 mg Intravenous Q24H    Infusions:    PRN Medications: acetaminophen, ondansetron **OR** ondansetron (ZOFRAN) IV   Assessment:  1. Fever/PNA 2. Hypotension 3. Chronic Systolic HF- ICM- ECHO 11/28/6431 EF 40-45%  4. PAF on chronic amiodarone and eliquis 5. DM 6. H/O VT  7. Anemia  8. Chronic Respiratory Failure- on 2 liters [place] at home  9. Hypothroid 10. CKD   Plan/Discussion:    Admitted with fever and hypotension. Afebrile over night. WBC down form 16>11>9.6  Urine negative. Blood cultures NGTD. On zosyn and vanc.   Will set up TEE to further assess with ICD.   BP better. Stop carvedilol. Has been off diuretics. Volume status stable.  Weight up 3 pounds.     Remains on eliquis 2.5 mg twice a day. Continue  amio 200 mg daily .  EGD-last admit with esophageal candida sis. On Fluconazone.   PT recommending --CIR   Length of Stay: 3   CLEGG,AMY NP-C 09/12/2014, 8:09  AM  Advanced Heart Failure Team Pager [phone] (M-F; [place])  Please contact [facility] for night-coverage after hours (4p -7a ) and weekends on [email or web address]  Patient seen and examined with Darrick Grinder, NP. We discussed all aspects of the encounter. I agree with the assessment and plan as stated above.   Improving with treatment of PNA. Volume status increasing. Will restart torsemide.  Now that we have source of fever (PNA) and BCX remain negative I think we can defer TEE. Agree with CIR. Abx per primary team. Continue apixaban for AF.   Can consider upgrade to CRT at some point.   [name] Sawhney,MD 8:50 AM

## 2014-09-12 NOTE — Progress Notes (Signed)
[place] TEAM 1 - Stepdown/ICU TEAM Progress Note  [name] GMW:[id number] DOB: [birth year] DOA: 09/09/2014 PCP: Tammi Sou, MD  Admit HPI / Brief Narrative: [name] PMHx  diabetes type 2 controlled, chronic systolic heart failure/ischemic cardiomyopathy moderate to severe mitral regurgitation,, paroxysmal atrial fibrillation on amiodarone and eliquis, CAD, Hx ventricular tachycardia post ICD, chronic respiratory failure on 2 L O2 via [place] at night (per patient/daughter PCP never arranged for O2 to be started), COPD, hypothyroidism, anemia. Discharged on 4/12 after admission for an acute systolic heart failure decompensation. During that admission patient had cardiogenic shock and required milrinone and norepinephrine. He also underwent right heart catheterization. He had anemia prompting an EGD that demonstrated esophageal candidiasis and was prescribed 10 days of fluconazole. His Lasix was changed to torsemide. He diuresed 12 pounds during that admission.  On 4/16 the patient presented to the ER with reports of fatigue and fever. He was listless and had a difficult time remaining awake to provide history. He denied diarrhea or abdominal pain. Patient had been prescribed Diflucan to take for the esophageal candidiasis but wife reports [facility] had not filled this prescription because they were concerned about drug drug interactions and had been unable to contact the prescribing provider for clarification.   Upon presentation to the ER the patient had a fever of 101.4. His blood pressure was 110/48. His room-air saturations were 97%. Chest x-ray and urinalysis were unremarkable. Sodium was slightly lower than baseline at 129. BUN 27 creatinine 2.05. Alkaline phosphatase 124, AST 65 and total bilirubin 2.8. Lactic acid was normal at 1.66 and troponin was normal 0.03. BNP was 550 noting his BNP on 4/6 was 800 and in the past has peaked as high as 6721.  HPI/Subjective: 4/19 A/O 4,  acute respiratory distress while on room air (SPO2 drops to low 80s), negative CP negative N/V  Assessment/Plan: Newly appreciated right lung HCAP - Metabolic encephalopathy/COPD exacerbation?  Urinalysis unrevealing - initial chest x-ray unrevealing, but follow-up chest x-ray noted right upper lobe infiltrate which persists on today's x-ray  - influenza screen negative  - very mildly elevated ammonia at admission not likely significantly contributing   blood cultures negative thus far - will treat as HCAP for now and continue to follow -DuoNeb QID -Solu-Medrol 60 mg daily -Flutter valve -Mucinex DM BID  OSA? -Patient has been informed to most likely has OSA however noted officially tested -Provide CPAP daily at bedtime  Transaminitis Very mild - acute viral hepatitis panel negative - steadily improving  Ischemic cardiomyopathy - severe systolic heart failure No evidence of acute exacerbation this time - discharge weight 4/12 was 68 kg - currently at 67.5 kg  - CHF team following  Chronic hyponatremia -Due to severe heart failure - stable   Paroxysmal atrial fibrillation -On chronic amiodarone and eliquis   Mitral regurgitation - moderate to severe -Not a candidate for surgical intervention per cardiology  Acute on chronic renal failure Chronic kidney disease with baseline creatinine 1.35-2.03 -Renal function is presently at baseline  Diabetes mellitus controlled -CBG currently well-controlled - continue to follow  Coronary artery disease status post CABG -Cardiology following - no evidence of acute issues  Hypothyroidism -Continue home medical therapy  Recent esophageal candidiasis -Complete Diflucan therapy; diagnosed via EGD last admission - did not complete Diflucan course as outpatient due to pharmacies concern for drug/drug interactions  Normocytic anemia Anemia panel most consistent with "anemia of chronic disease"    Code Status: FULL Family  Communication: no  family present at time of exam Disposition Plan: Per cardiology    Consultants: Dr.Daniel R Bensimhon (cardiology CHF)  Procedure/Significant Events: 4/7 echocardiogram;- Left ventricle: mild LVH.- LVEF= 40% to 45%. There is hypokinesis of the basal-midinferoseptal myocardium. - (grade 2diastolic dysfunction). - Mitral valve: moderate to severe regurgitation. - Left atrium: moderately to severely dilated. - Right ventricle: moderately dilated.  - Right atrium:  moderately to severely dilated. - Tricuspid valve moderate regurgitation. - Pulmonary arteries: PA peak pressure: 78 mm Hg (S).   Culture 4/16 MRSA by PCR positive 4/16 blood right arm/hand NGTD  4/16 urine negative 4/16 blood central line/left antecubital NGTD 4/18 C. difficile by PCR negative  Antibiotics: Diflucan or/16>> Zosyn 4/16>> Vancomycin 4/16>>   DVT prophylaxis: Eliquis   Devices    LINES / TUBES:      Continuous Infusions:   Objective: VITAL SIGNS: Temp: 98.3 F (36.8 C) (04/19 1643) Temp Source: Oral (04/19 1643) BP: 113/45 mmHg (04/19 1900) Pulse Rate: 68 (04/19 1900) SPO2; FIO2:   Intake/Output Summary (Last 24 hours) at 09/12/14 2025 Last data filed at 09/12/14 1719  Gross per 24 hour  Intake 1257.5 ml  Output    950 ml  Net  307.5 ml     Exam: General: A/O 4, while on room air some Acute respiratory distress, Lungs: diffuse poor air movement, mild inspiratory/expiratory wheezing, negative crackles Cardiovascular: Regular rate and rhythm without murmur gallop or rub normal S1 and S2 Abdomen: Nontender, nondistended, soft, bowel sounds positive, no rebound, no ascites, no appreciable mass Extremities: No significant cyanosis, clubbing, or edema bilateral lower extremities  Data Reviewed: Basic Metabolic Panel:  Recent Labs Lab 09/09/14 1150 09/09/14 1723 09/10/14 0558 09/11/14 0216 09/12/14 0410  NA 129* 129* 129* 128* 127*  K 3.6 3.5 3.7  3.4* 3.8  CL 92* 94* 94* 92* 92*  CO2 25 25 22 26 25   GLUCOSE 91 120* 135* 108* 115*  BUN 27* 27* 25* 25* 25*  CREATININE 2.05* 1.99* 2.02* 2.15* 1.91*  CALCIUM 7.8* 7.5* 7.1* 7.2* 7.4*   Liver Function Tests:  Recent Labs Lab 09/09/14 1150 09/09/14 1723 09/10/14 0558 09/11/14 0216 09/12/14 0410  AST 65* 51* 64* 54* 47*  ALT 25 21 21 19 19   ALKPHOS 124* 101 118* 107 111  BILITOT 2.8* 2.6* 2.1* 1.6* 1.6*  PROT 6.4 5.4* 5.3* 5.2* 5.1*  ALBUMIN 2.3* 1.9* 1.9* 1.7* 1.7*   No results for input(s): LIPASE, AMYLASE in the last 168 hours.  Recent Labs Lab 09/09/14 1515 09/11/14 0216  AMMONIA 49* 31   CBC:  Recent Labs Lab 09/09/14 1150 09/09/14 1723 09/10/14 0110 09/11/14 0216 09/12/14 0410  WBC 12.6* 11.9* 16.0* 11.4* 9.6  NEUTROABS  --  9.3*  --   --   --   HGB 10.8* 9.8* 10.5* 9.5* 9.4*  HCT 33.5* 29.7* 31.9* 28.4* 27.8*  MCV 89.3 88.4 89.6 87.7 87.1  PLT 160 142* 166 153 148*   Cardiac Enzymes: No results for input(s): CKTOTAL, CKMB, CKMBINDEX, TROPONINI in the last 168 hours. BNP (last 3 results)  Recent Labs  06/07/14 1421 08/30/14 1639 09/09/14 1150  BNP 528.4* 800.7* 550.7*    ProBNP (last 3 results)  Recent Labs  01/24/14 1909 02/09/14 0945  PROBNP 6721.0* 2555.0*    CBG:  Recent Labs Lab 09/11/14 1151 09/11/14 2124 09/12/14 0806 09/12/14 1201 09/12/14 1643  GLUCAP 138* 153* 115* 146* 161*    Recent Results (from the past 240 hour(s))  Culture, blood (routine x 2)  Status: None (Preliminary result)   Collection Time: 09/09/14 11:55 AM  Result Value Ref Range Status   Specimen Description BLOOD RIGHT ARM  Final   Special Requests BOTTLES DRAWN AEROBIC AND ANAEROBIC 10 CC  Final   Culture   Final           BLOOD CULTURE RECEIVED NO GROWTH TO DATE CULTURE WILL BE HELD FOR 5 DAYS BEFORE ISSUING A FINAL NEGATIVE REPORT Performed at [facility]    Report Status PENDING  Incomplete  Culture, blood (routine x 2)      Status: None (Preliminary result)   Collection Time: 09/09/14 12:00 PM  Result Value Ref Range Status   Specimen Description BLOOD RIGHT HAND  Final   Special Requests BOTTLES DRAWN AEROBIC ONLY 5 CC  Final   Culture   Final           BLOOD CULTURE RECEIVED NO GROWTH TO DATE CULTURE WILL BE HELD FOR 5 DAYS BEFORE ISSUING A FINAL NEGATIVE REPORT Performed at [facility]    Report Status PENDING  Incomplete  Urine culture     Status: None   Collection Time: 09/09/14  1:53 PM  Result Value Ref Range Status   Specimen Description URINE, CLEAN CATCH  Final   Special Requests NONE  Final   Colony Count NO GROWTH Performed at [facility]   Final   Culture NO GROWTH Performed at [facility]   Final   Report Status 09/11/2014 FINAL  Final  MRSA PCR Screening     Status: None   Collection Time: 09/09/14  4:28 PM  Result Value Ref Range Status   MRSA by PCR NEGATIVE NEGATIVE Final    Comment:        The GeneXpert MRSA Assay (FDA approved for NASAL specimens only), is one component of a comprehensive MRSA colonization surveillance program. It is not intended to diagnose MRSA infection nor to guide or monitor treatment for MRSA infections.   Culture, blood (x 2)     Status: None (Preliminary result)   Collection Time: 09/09/14  5:17 PM  Result Value Ref Range Status   Specimen Description BLOOD CENTRAL LINE  Final   Special Requests BOTTLES DRAWN AEROBIC AND ANAEROBIC 5CC  Final   Culture   Final           BLOOD CULTURE RECEIVED NO GROWTH TO DATE CULTURE WILL BE HELD FOR 5 DAYS BEFORE ISSUING A FINAL NEGATIVE REPORT Performed at [facility]    Report Status PENDING  Incomplete  Culture, blood (x 2)     Status: None (Preliminary result)   Collection Time: 09/09/14  5:23 PM  Result Value Ref Range Status   Specimen Description BLOOD LEFT ANTECUBITAL  Final   Special Requests BOTTLES DRAWN AEROBIC AND ANAEROBIC 10CC  Final   Culture   Final            BLOOD CULTURE RECEIVED NO GROWTH TO DATE CULTURE WILL BE HELD FOR 5 DAYS BEFORE ISSUING A FINAL NEGATIVE REPORT Performed at [facility]    Report Status PENDING  Incomplete  Clostridium Difficile by PCR     Status: None   Collection Time: 09/11/14  5:23 AM  Result Value Ref Range Status   C difficile by pcr NEGATIVE NEGATIVE Final     Studies:  Recent x-ray studies have been reviewed in detail by the Attending Physician  Scheduled Meds:  Scheduled Meds: . amiodarone  200 mg Oral  Daily  . apixaban  5 mg Oral BID  . dextromethorphan-guaiFENesin  1 tablet Oral BID  . fluconazole (DIFLUCAN) IV  100 mg Intravenous Q24H  . folic acid  1 mg Oral Daily  . insulin aspart  0-9 Units Subcutaneous TID WC  . ipratropium-albuterol  3 mL Nebulization Q6H  . levothyroxine  75 mcg Oral QAC breakfast  . piperacillin-tazobactam (ZOSYN)  IV  3.375 g Intravenous Q8H  . thiamine  100 mg Oral Daily  . tobramycin  2 drop Both Eyes 4 times per day  . torsemide  40 mg Oral Daily  . vancomycin  750 mg Intravenous Q24H    Time spent on care of this patient: 40 mins   Lanis Storlie, Geraldo Docker , MD  [facility] Office  [phone] Pager [phone]  On-Call/Text Page:      [email or web address]      password [email or web address]  If 7PM-7AM, please contact night-coverage [email or web address] Password [email or web address] 09/12/2014, 8:25 PM   LOS: 3 days   Care during the described time interval was provided by me .  I have reviewed this patient's available data, including medical history, events of note, physical examination, radiology studies and test results as part of my evaluation  Dia Crawford, MD [phone] Pager

## 2014-09-12 NOTE — Progress Notes (Signed)
Physical Therapy Treatment Patient Details Name: ALVAN CULPEPPER MRN: [id number] DOB: [birth year] Today's Date: 09/12/2014    History of Present Illness Mr. Kueker is a 79 yo male with a history of HTN, CAD s/p CABG, ICM s/p Medtronic ICD, chronic systolic heart failure, DM2, Vtach, atrial flutter, PVD and GI mass. EF 20-25%, Mod-severe MR. Pt readmitted with fever and encephalopathy after D/C 4/12 and readmit 4/16    PT Comments    Pt with great improvement today with increased activity tolerance, ability with transfers and able to tolerate HEP end of session. Pt will have wife available at home and with this change in status could benefit from HHPT. Will continue to follow and recommend daily ambulation and HEP. Dgtr present for education end of session.   Follow Up Recommendations  Home health PT;Supervision for mobility/OOB     Equipment Recommendations  Rolling walker with 5" wheels    Recommendations for Other Services       Precautions / Restrictions Precautions Precautions: Fall    Mobility  Bed Mobility Overal bed mobility: Modified Independent                Transfers Overall transfer level: Needs assistance   Transfers: Sit to/from Stand Sit to Stand: Supervision         General transfer comment: cues for hand placement  Ambulation/Gait Ambulation/Gait assistance: Min guard Ambulation Distance (Feet): 180 Feet Assistive device: Rolling walker (2 wheeled) Gait Pattern/deviations: Step-through pattern;Decreased stride length;Trunk flexed   Gait velocity interpretation: Below normal speed for age/gender General Gait Details: cues for posture and position in RW with 2 standing rest breaks and cues for breathing technique to increase oxygen saturation   Stairs            Wheelchair Mobility    Modified Rankin (Stroke Patients Only)       Balance Overall balance assessment: Needs assistance   Sitting balance-Leahy Scale: Good        Standing balance-Leahy Scale: Fair                      Cognition Arousal/Alertness: Awake/alert Behavior During Therapy: WFL for tasks assessed/performed Overall Cognitive Status: Within Functional Limits for tasks assessed                      Exercises General Exercises - Lower Extremity Long Arc Quad: AROM;Seated;Both;20 reps Hip Flexion/Marching: AROM;Seated;Both;20 reps    General Comments        Pertinent Vitals/Pain Pain Assessment: No/denies pain  HR 73 sats 87-96% on 2L with gait BP 125/42    Home Living                      Prior Function            PT Goals (current goals can now be found in the care plan section) Progress towards PT goals: Progressing toward goals;Goals met and updated - see care plan    Frequency  Min 3X/week    PT Plan Discharge plan needs to be updated    Co-evaluation             End of Session Equipment Utilized During Treatment: Oxygen Activity Tolerance: Patient tolerated treatment well Patient left: in chair;with call bell/phone within reach;with nursing/sitter in room;with family/visitor present     Time: 2800-3491 PT Time Calculation (min) (ACUTE ONLY): 19 min  Charges:  $Gait Training: 8-22 mins  G CodesMelford Aase 09/30/2014, 8:52 AM Elwyn Reach, [place]

## 2014-09-12 NOTE — Progress Notes (Signed)
ANTIBIOTIC CONSULT NOTE - FOLLOW UP  Pharmacy Consult for vancomycin, zosyn Indication: pneumonia  Allergies  Allergen Reactions  . Niacin Itching    Niaspan     Patient Measurements: Height: 5\' 7"  (170.2 cm) Weight: 152 lb 1.9 oz (69 kg) IBW/kg (Calculated) : 66.1 Adjusted Body Weight: n/a  Vital Signs: Temp: 98.2 F (36.8 C) (04/19 0803) Temp Source: Oral (04/19 0803) BP: 110/43 mmHg (04/19 0900) Pulse Rate: 71 (04/19 0900) Intake/Output from previous day: 04/18 0701 - 04/19 0700 In: 1337.5 [P.O.:1000; IV Piggyback:337.5] Out: 1000 [Urine:1000] Intake/Output from this shift: Total I/O In: 220 [P.O.:220] Out: 200 [Urine:200]  Labs:  Recent Labs  09/10/14 0110 09/10/14 0558 09/11/14 0216 09/12/14 0410  WBC 16.0*  --  11.4* 9.6  HGB 10.5*  --  9.5* 9.4*  PLT 166  --  153 148*  CREATININE  --  2.02* 2.15* 1.91*   Estimated Creatinine Clearance: 30.3 mL/min (by C-G formula based on Cr of 1.91). No results for input(s): VANCOTROUGH, VANCOPEAK, VANCORANDOM, GENTTROUGH, GENTPEAK, GENTRANDOM, TOBRATROUGH, TOBRAPEAK, TOBRARND, AMIKACINPEAK, AMIKACINTROU, AMIKACIN in the last 72 hours.   Assessment: 79 yo male admitted with CFF, on vancomycin and zosyn empirically for PNA.  Dosing remains appropriate per current renal function.  Goal of Therapy:  Vancomycin trough level 15-20 mcg/ml  Plan:  1. Continue vancomycin 750 mg IV q 24 hrs. 2. Continue Zosyn 3.375g IV q 8 hrs. 3. Consider narrowing to Levaquin today.  Uvaldo Rising, BCPS  Clinical Pharmacist Pager [phone]  09/12/2014 10:20 AM

## 2014-09-13 ENCOUNTER — Inpatient Hospital Stay (HOSPITAL_COMMUNITY): Admit: 2014-09-13 | Payer: Medicare Other

## 2014-09-13 LAB — BASIC METABOLIC PANEL
Anion gap: 10 (ref 5–15)
BUN: 23 mg/dL (ref 6–23)
CHLORIDE: 91 mmol/L — AB (ref 96–112)
CO2: 28 mmol/L (ref 19–32)
Calcium: 7.8 mg/dL — ABNORMAL LOW (ref 8.4–10.5)
Creatinine, Ser: 1.94 mg/dL — ABNORMAL HIGH (ref 0.50–1.35)
GFR calc Af Amer: 37 mL/min — ABNORMAL LOW (ref >90)
GFR calc non Af Amer: 32 mL/min — ABNORMAL LOW (ref >90)
GLUCOSE: 235 mg/dL — AB (ref 70–99)
POTASSIUM: 3.4 mmol/L — AB (ref 3.5–5.1)
SODIUM: 129 mmol/L — AB (ref 135–145)

## 2014-09-13 LAB — GLUCOSE, CAPILLARY
GLUCOSE-CAPILLARY: 157 mg/dL — AB (ref 70–99)
GLUCOSE-CAPILLARY: 199 mg/dL — AB (ref 70–99)
Glucose-Capillary: 127 mg/dL — ABNORMAL HIGH (ref 70–99)
Glucose-Capillary: 152 mg/dL — ABNORMAL HIGH (ref 70–99)

## 2014-09-13 LAB — CBC
HCT: 30.2 % — ABNORMAL LOW (ref 39.0–52.0)
HEMOGLOBIN: 10 g/dL — AB (ref 13.0–17.0)
MCH: 29.1 pg (ref 26.0–34.0)
MCHC: 33.1 g/dL (ref 30.0–36.0)
MCV: 87.8 fL (ref 78.0–100.0)
Platelets: 170 10*3/uL (ref 150–400)
RBC: 3.44 MIL/uL — ABNORMAL LOW (ref 4.22–5.81)
RDW: 18.8 % — ABNORMAL HIGH (ref 11.5–15.5)
WBC: 8.4 10*3/uL (ref 4.0–10.5)

## 2014-09-13 MED ORDER — LEVOFLOXACIN 750 MG PO TABS
750.0000 mg | ORAL_TABLET | ORAL | Status: DC
  Administered 2014-09-13: 750 mg via ORAL
  Filled 2014-09-13: qty 1

## 2014-09-13 MED ORDER — FLUCONAZOLE 100 MG PO TABS
100.0000 mg | ORAL_TABLET | Freq: Every day | ORAL | Status: DC
  Administered 2014-09-13 – 2014-09-14 (×2): 100 mg via ORAL
  Filled 2014-09-13 (×3): qty 1

## 2014-09-13 NOTE — Progress Notes (Signed)
[facility] TEAM 1 - Stepdown/ICU TEAM Progress Note  ANOOP HEMMER SWF:[id number] DOB: 12-17-1935 DOA: 09/09/2014 PCP: Tammi Sou, MD  Admit HPI / Brief Narrative: 79 year old male discharged on 4/12 after admission for an acute systolic heart failure decompensation. During that admission patient had cardiogenic shock and required milrinone and norepinephrine. He also underwent right heart catheterization. He had anemia prompting an EGD that demonstrated esophageal candidiasis and was prescribed 10 days of fluconazole. His Lasix was changed to torsemide. He diuresed 12 pounds during that admission. Other medical problems include paroxysmal atrial fibrillation on amiodarone and eliquis, moderate to severe mitral regurgitation, chronic kidney disease stage III suspected cardiorenal etiology, diabetes mellitus on insulin, known CAD, COPD, history of ventricular tachycardia post ICD, and hypothyroidism.   On 4/16 the patient presented to the ER with reports of fatigue and fever.  He was listless and had a difficult time remaining awake to provide history. He denied diarrhea or abdominal pain. Patient had been prescribed Diflucan to take for the esophageal candidiasis but wife reports [facility] had not filled this prescription because they were concerned about drug drug interactions and had been unable to contact the prescribing provider for clarification.   Upon presentation to the ER the patient had a fever of 101.4. His blood pressure was 110/48. His room-air saturations were 97%. Chest x-ray and urinalysis were unremarkable. Sodium was slightly lower than baseline at 129. BUN 27 creatinine 2.05. Alkaline phosphatase 124, AST 65 and total bilirubin 2.8. Lactic acid was normal at 1.66 and troponin was normal 0.03. BNP was 550 noting his BNP on 4/6 was 800 and in the past has peaked as high as 6721.  HPI/Subjective: Patient is resting comfortably today.  He reports he feels much better.  He denies  chest pain shortness breath fevers chills nausea vomiting or abdominal pain.  He states that his appetite is good.  Assessment/Plan:  Newly appreciated right lung HCAP - Metabolic encephalopathy  Urinalysis unrevealing - initial chest x-ray unrevealing, but follow-up chest x-ray noted right upper lobe infiltrate which persisted on x-ray - influenza screen negative - very mildly elevated ammonia at admission not likely significantly contributing - blood cultures negative - responding favorably to empiric treatment for HCAP - transition to oral treatment today  Transaminitis Very mild - acute viral hepatitis panel negative - steadily improving  Ischemic cardiomyopathy - severe systolic heart failure No evidence of acute exacerbation at this time - discharge weight 4/12 was 68 kg - currently at 68.4 kg - CHF team following  Chronic hyponatremia Due to severe heart failure - stable   Paroxysmal atrial fibrillation On chronic amiodarone and eliquis - stable  Mitral regurgitation - moderate to severeU cane and a gain alth that she isough I'm sure he'll say more like 7 or 8 Not a candidate for surgical intervention per cardiology  Chronic kidney disease with baseline creatinine 2.4 - stage 3, GFR 30-59 ml/min Renal function is presently stable/better than baseline  Diabetes mellitus CBG currently well-controlled - continue to follow  Coronary artery disease status post CABG Cardiology following - no evidence of acute issues  COPD Well compensated at the present time  Hypothyroidism Continue home medical therapy  Recent esophageal candidiasis no evidence of worsening clinically - diagnosed via EGD last admission - did not complete Diflucan course as outpatient due to pharmacies concern for drug/drug interactions - was to complete 10 days of tx -  today is day 7 total - will consider stopping at time of d/c  as clinically this appears to have resolved   Normocytic anemia Anemia panel  most consistent with "anemia of chronic disease"  Code Status: FULL Family Communication: spoke w/ wife at bedside  Disposition Plan: transfer to medical bed - ambualte - possible d/c home in AM   Consultants: [facility] cardiology / CHF Team   Procedures: None  Antibiotics: Zosyn 4/16 > Vancomycin 4/16 > 4/19  Fluconazole 4/16 > 4/19 Levaquin 4/20 >  DVT prophylaxis: IV heparin > apixaban  Objective: Blood pressure 107/38, pulse 80, temperature 98.3 F (36.8 C), temperature source Oral, resp. rate 16, height 5\' 7"  (1.702 m), weight 68.4 kg (150 lb 12.7 oz), SpO2 95 %.  Intake/Output Summary (Last 24 hours) at 09/13/14 1115 Last data filed at 09/13/14 0600  Gross per 24 hour  Intake   1050 ml  Output   1150 ml  Net   -100 ml   Exam: General: No acute respiratory distress - alert and oriented Lungs: Clear to auscultation bilaterally - no wheeze  Cardiovascular: Regular rate without murmur gallop or rub appreciable Abdomen: Nontender, nondistended, soft, bowel sounds positive, no rebound, no ascites, no appreciable mass Extremities: No significant cyanosis, clubbing, edema bilateral lower extremities  Data Reviewed: Basic Metabolic Panel:  Recent Labs Lab 09/09/14 1150 09/09/14 1723 09/10/14 0558 09/11/14 0216 09/12/14 0410  NA 129* 129* 129* 128* 127*  K 3.6 3.5 3.7 3.4* 3.8  CL 92* 94* 94* 92* 92*  CO2 25 25 22 26 25   GLUCOSE 91 120* 135* 108* 115*  BUN 27* 27* 25* 25* 25*  CREATININE 2.05* 1.99* 2.02* 2.15* 1.91*  CALCIUM 7.8* 7.5* 7.1* 7.2* 7.4*    Liver Function Tests:  Recent Labs Lab 09/09/14 1150 09/09/14 1723 09/10/14 0558 09/11/14 0216 09/12/14 0410  AST 65* 51* 64* 54* 47*  ALT 25 21 21 19 19   ALKPHOS 124* 101 118* 107 111  BILITOT 2.8* 2.6* 2.1* 1.6* 1.6*  PROT 6.4 5.4* 5.3* 5.2* 5.1*  ALBUMIN 2.3* 1.9* 1.9* 1.7* 1.7*    Recent Labs Lab 09/09/14 1515 09/11/14 0216  AMMONIA 49* 31   Coags:  Recent Labs Lab 09/09/14 1723  09/10/14 0110  INR 1.66* 1.51*    Recent Labs Lab 09/09/14 1723 09/10/14 0110 09/10/14 1043  APTT 47* 58* 134*    CBC:  Recent Labs Lab 09/09/14 1723 09/10/14 0110 09/11/14 0216 09/12/14 0410 09/13/14 0221  WBC 11.9* 16.0* 11.4* 9.6 8.4  NEUTROABS 9.3*  --   --   --   --   HGB 9.8* 10.5* 9.5* 9.4* 10.0*  HCT 29.7* 31.9* 28.4* 27.8* 30.2*  MCV 88.4 89.6 87.7 87.1 87.8  PLT 142* 166 153 148* 170    CBG:  Recent Labs Lab 09/12/14 0806 09/12/14 1201 09/12/14 1643 09/12/14 2136 09/13/14 0853  GLUCAP 115* 146* 161* 117* 157*    Recent Results (from the past 240 hour(s))  Culture, blood (routine x 2)     Status: None (Preliminary result)   Collection Time: 09/09/14 11:55 AM  Result Value Ref Range Status   Specimen Description BLOOD RIGHT ARM  Final   Special Requests BOTTLES DRAWN AEROBIC AND ANAEROBIC 10 CC  Final   Culture   Final           BLOOD CULTURE RECEIVED NO GROWTH TO DATE CULTURE WILL BE HELD FOR 5 DAYS BEFORE ISSUING A FINAL NEGATIVE REPORT Performed at [facility]    Report Status PENDING  Incomplete  Culture, blood (routine x 2)  Status: None (Preliminary result)   Collection Time: 09/09/14 12:00 PM  Result Value Ref Range Status   Specimen Description BLOOD RIGHT HAND  Final   Special Requests BOTTLES DRAWN AEROBIC ONLY 5 CC  Final   Culture   Final           BLOOD CULTURE RECEIVED NO GROWTH TO DATE CULTURE WILL BE HELD FOR 5 DAYS BEFORE ISSUING A FINAL NEGATIVE REPORT Performed at [facility]    Report Status PENDING  Incomplete  Urine culture     Status: None   Collection Time: 09/09/14  1:53 PM  Result Value Ref Range Status   Specimen Description URINE, CLEAN CATCH  Final   Special Requests NONE  Final   Colony Count NO GROWTH Performed at [facility]   Final   Culture NO GROWTH Performed at [facility]   Final   Report Status 09/11/2014 FINAL  Final  MRSA PCR Screening     Status: None    Collection Time: 09/09/14  4:28 PM  Result Value Ref Range Status   MRSA by PCR NEGATIVE NEGATIVE Final    Comment:        The GeneXpert MRSA Assay (FDA approved for NASAL specimens only), is one component of a comprehensive MRSA colonization surveillance program. It is not intended to diagnose MRSA infection nor to guide or monitor treatment for MRSA infections.   Culture, blood (x 2)     Status: None (Preliminary result)   Collection Time: 09/09/14  5:17 PM  Result Value Ref Range Status   Specimen Description BLOOD CENTRAL LINE  Final   Special Requests BOTTLES DRAWN AEROBIC AND ANAEROBIC 5CC  Final   Culture   Final           BLOOD CULTURE RECEIVED NO GROWTH TO DATE CULTURE WILL BE HELD FOR 5 DAYS BEFORE ISSUING A FINAL NEGATIVE REPORT Performed at [facility]    Report Status PENDING  Incomplete  Culture, blood (x 2)     Status: None (Preliminary result)   Collection Time: 09/09/14  5:23 PM  Result Value Ref Range Status   Specimen Description BLOOD LEFT ANTECUBITAL  Final   Special Requests BOTTLES DRAWN AEROBIC AND ANAEROBIC 10CC  Final   Culture   Final           BLOOD CULTURE RECEIVED NO GROWTH TO DATE CULTURE WILL BE HELD FOR 5 DAYS BEFORE ISSUING A FINAL NEGATIVE REPORT Performed at [facility]    Report Status PENDING  Incomplete  Clostridium Difficile by PCR     Status: None   Collection Time: 09/11/14  5:23 AM  Result Value Ref Range Status   C difficile by pcr NEGATIVE NEGATIVE Final     Studies:   Recent x-ray studies have been reviewed in detail by the Attending Physician  Scheduled Meds:  Scheduled Meds: . amiodarone  200 mg Oral Daily  . apixaban  5 mg Oral BID  . dextromethorphan-guaiFENesin  1 tablet Oral BID  . fluconazole (DIFLUCAN) IV  100 mg Intravenous Q24H  . folic acid  1 mg Oral Daily  . insulin aspart  0-9 Units Subcutaneous TID WC  . ipratropium-albuterol  3 mL Nebulization Q6H  . levothyroxine  75 mcg Oral QAC  breakfast  . piperacillin-tazobactam (ZOSYN)  IV  3.375 g Intravenous Q8H  . thiamine  100 mg Oral Daily  . tobramycin  2 drop Both Eyes 4 times per day  . torsemide  40 mg Oral Daily  . vancomycin  750 mg Intravenous Q24H    Time spent on care of this patient: 35 mins   MCCLUNG,JEFFREY T , MD   [facility] Office  [phone] Pager - Text Page per Shea Evans as per below:  On-Call/Text Page:      [email or web address]      password [email or web address]  If 7PM-7AM, please contact night-coverage [email or web address] Password [email or web address] 09/13/2014, 11:15 AM   LOS: 4 days

## 2014-09-13 NOTE — Progress Notes (Signed)
Advanced Heart Failure Rounding Note   Subjective:    [name] is a 79 yo male with a history of HTN, CAD s/p CABG, ICM s/p Medtronic ICD, chronic systolic heart failure, DM2, Vtach, atrial flutter, PVD and GI mass. ECHO (12/22/13) EF 20-25%, Echo EF 40% (12/15) Mod-severe MR.   Most recent admit with fatigue and dyspnea. Found to be anemic. Received 2UPRBCs. Had EGD that showed candidal esophagitis. Discharged on diflucan.   Admitted April 16th with fever and hypotension. Found to have PNA. Weight down 2 pounds. Off diuretics and BB.   Starting diuretics today. PT recommending  Rehab. Hemoglobin stable.   Denies SOB/PND/Orthopnea.    Blood cultures --> NGTD.  Urine Culture Negative  C diff- negative CXR : RUL PNA   Objective:   Weight Range:  Vital Signs:   Temp:  [98.1 F (36.7 C)-98.5 F (36.9 C)] 98.3 F (36.8 C) (04/20 0400) Pulse Rate:  [66-77] 68 (04/20 0700) Resp:  [12-25] 16 (04/20 0700) BP: (90-132)/(39-79) 109/43 mmHg (04/20 0700) SpO2:  [90 %-100 %] 99 % (04/20 0700) Weight:  [150 lb 12.7 oz (68.4 kg)] 150 lb 12.7 oz (68.4 kg) (04/20 0332) Last BM Date: 09/12/14  Weight change: Filed Weights   09/11/14 0500 09/12/14 0500 09/13/14 0332  Weight: 149 lb 7.6 oz (67.8 kg) 152 lb 1.9 oz (69 kg) 150 lb 12.7 oz (68.4 kg)    Intake/Output:   Intake/Output Summary (Last 24 hours) at 09/13/14 0816 Last data filed at 09/13/14 0600  Gross per 24 hour  Intake   1270 ml  Output   1350 ml  Net    -80 ml     Physical Exam: General: Chronically ill appearing. No resp difficulty. Sitting in the chair.  HEENT: normal Neck: supple. JVP 5-6 Carotids 2+ bilaterally; no bruits. No lymphadenopathy or thryomegaly appreciated. RIJ  Cor: PMI normal. Regular rate & rhythm. No rubs, gallops. 2/6 HSM apex.  Lungs: clear Abdomen: soft, nontender, mildly distended. No hepatosplenomegaly. No bruits or masses. Good bowel sounds. Extremities: no cyanosis, clubbing, rash, .  2+ ankle edema Ted hose in place  Neuro: alert & orientedx3, cranial nerves grossly intact. Moves all 4 extremities w/o difficulty. Affect pleasant.  Telemetry: SR 60-70s   Labs: Basic Metabolic Panel:  Recent Labs Lab 09/09/14 1150 09/09/14 1723 09/10/14 0558 09/11/14 0216 09/12/14 0410  NA 129* 129* 129* 128* 127*  K 3.6 3.5 3.7 3.4* 3.8  CL 92* 94* 94* 92* 92*  CO2 25 25 22 26 25   GLUCOSE 91 120* 135* 108* 115*  BUN 27* 27* 25* 25* 25*  CREATININE 2.05* 1.99* 2.02* 2.15* 1.91*  CALCIUM 7.8* 7.5* 7.1* 7.2* 7.4*    Liver Function Tests:  Recent Labs Lab 09/09/14 1150 09/09/14 1723 09/10/14 0558 09/11/14 0216 09/12/14 0410  AST 65* 51* 64* 54* 47*  ALT 25 21 21 19 19   ALKPHOS 124* 101 118* 107 111  BILITOT 2.8* 2.6* 2.1* 1.6* 1.6*  PROT 6.4 5.4* 5.3* 5.2* 5.1*  ALBUMIN 2.3* 1.9* 1.9* 1.7* 1.7*   No results for input(s): LIPASE, AMYLASE in the last 168 hours.  Recent Labs Lab 09/09/14 1515 09/11/14 0216  AMMONIA 49* 31    CBC:  Recent Labs Lab 09/09/14 1723 09/10/14 0110 09/11/14 0216 09/12/14 0410 09/13/14 0221  WBC 11.9* 16.0* 11.4* 9.6 8.4  NEUTROABS 9.3*  --   --   --   --   HGB 9.8* 10.5* 9.5* 9.4* 10.0*  HCT 29.7* 31.9* 28.4* 27.8*  30.2*  MCV 88.4 89.6 87.7 87.1 87.8  PLT 142* 166 153 148* 170    Cardiac Enzymes: No results for input(s): CKTOTAL, CKMB, CKMBINDEX, TROPONINI in the last 168 hours.  BNP: BNP (last 3 results)  Recent Labs  06/07/14 1421 08/30/14 1639 09/09/14 1150  BNP 528.4* 800.7* 550.7*    ProBNP (last 3 results)  Recent Labs  01/24/14 1909 02/09/14 0945  PROBNP 6721.0* 2555.0*      Other results:  Imaging: No results found.   Medications:     Scheduled Medications: . amiodarone  200 mg Oral Daily  . apixaban  5 mg Oral BID  . dextromethorphan-guaiFENesin  1 tablet Oral BID  . fluconazole (DIFLUCAN) IV  100 mg Intravenous Q24H  . folic acid  1 mg Oral Daily  . insulin aspart  0-9 Units  Subcutaneous TID WC  . ipratropium-albuterol  3 mL Nebulization Q6H  . levothyroxine  75 mcg Oral QAC breakfast  . piperacillin-tazobactam (ZOSYN)  IV  3.375 g Intravenous Q8H  . thiamine  100 mg Oral Daily  . tobramycin  2 drop Both Eyes 4 times per day  . torsemide  40 mg Oral Daily  . vancomycin  750 mg Intravenous Q24H    Infusions:    PRN Medications: acetaminophen, ondansetron **OR** ondansetron (ZOFRAN) IV   Assessment:  1. Fever/PNA 2. Hypotension 3. Chronic Systolic HF- ICM- ECHO 10/26/2295 EF 40-45%  4. PAF on chronic amiodarone and eliquis 5. DM 6. H/O VT  7. Anemia  8. Chronic Respiratory Failure- on 2 liters [place] at home  9. Hypothroid 10. CKD   Plan/Discussion:    Admitted with fever and hypotension.--> PNAAfebrile over night. WBC down form 16>11>9.6>8.4   Urine negative. Blood cultures NGTD. On zosyn and vanc.   TEE deferred for now.   BP better. Stop carvedilol. Back on torsemide 40 mg daily. BMET pending.   Weight down 2 pounds.     Remains on eliquis 5 mg twice a day. Continue  amio 200 mg daily .  EGD-last admit with esophageal candida sis. On Fluconazone.   PT recommending home health.   Length of Stay: 4   CLEGG,AMY NP-C 09/13/2014, 8:16 AM  Advanced Heart Failure Team Pager [phone] (M-F; 7a - 4p)  Please contact [facility] for night-coverage after hours (4p -7a ) and weekends on [email or web address]  Patient seen and examined with Darrick Grinder, NP. We discussed all aspects of the encounter. I agree with the assessment and plan as stated above.   Volume status improving. Continue po torsemide. Feeling better. Continues with diarrhea. C. Diff negative. Suspected antibiotic related. Consider switch abx to levofloxacin. Stable from a HF perspective. Will need HHPT and HHRN.   Aja Whitehair,MD 10:06 AM

## 2014-09-13 NOTE — Progress Notes (Signed)
SATURATION QUALIFICATIONS: (This note is used to comply with regulatory documentation for home oxygen)  Patient Saturations on Room Air at Rest = 86%   Patient Saturations on Room Air while Ambulating = <86%  Please briefly explain why patient needs home oxygen: Patient desaturates to 86% at times on room air at rest. Patient needs home oxygen.  [name] R

## 2014-09-14 ENCOUNTER — Telehealth: Payer: Self-pay | Admitting: Cardiology

## 2014-09-14 ENCOUNTER — Encounter: Payer: Medicare Other | Admitting: *Deleted

## 2014-09-14 DIAGNOSIS — I270 Primary pulmonary hypertension: Secondary | ICD-10-CM

## 2014-09-14 DIAGNOSIS — I272 Pulmonary hypertension, unspecified: Secondary | ICD-10-CM | POA: Diagnosis present

## 2014-09-14 DIAGNOSIS — I2510 Atherosclerotic heart disease of native coronary artery without angina pectoris: Secondary | ICD-10-CM | POA: Diagnosis present

## 2014-09-14 DIAGNOSIS — I340 Nonrheumatic mitral (valve) insufficiency: Secondary | ICD-10-CM | POA: Diagnosis present

## 2014-09-14 DIAGNOSIS — G4733 Obstructive sleep apnea (adult) (pediatric): Secondary | ICD-10-CM | POA: Diagnosis present

## 2014-09-14 LAB — COMPREHENSIVE METABOLIC PANEL
ALBUMIN: 1.8 g/dL — AB (ref 3.5–5.2)
ALK PHOS: 174 U/L — AB (ref 39–117)
ALT: 24 U/L (ref 0–53)
AST: 70 U/L — ABNORMAL HIGH (ref 0–37)
Anion gap: 14 (ref 5–15)
BUN: 22 mg/dL (ref 6–23)
CO2: 27 mmol/L (ref 19–32)
Calcium: 7.3 mg/dL — ABNORMAL LOW (ref 8.4–10.5)
Chloride: 89 mmol/L — ABNORMAL LOW (ref 96–112)
Creatinine, Ser: 1.9 mg/dL — ABNORMAL HIGH (ref 0.50–1.35)
GFR calc Af Amer: 38 mL/min — ABNORMAL LOW (ref >90)
GFR calc non Af Amer: 32 mL/min — ABNORMAL LOW (ref >90)
Glucose, Bld: 110 mg/dL — ABNORMAL HIGH (ref 70–99)
Potassium: 3.6 mmol/L (ref 3.5–5.1)
Sodium: 130 mmol/L — ABNORMAL LOW (ref 135–145)
TOTAL PROTEIN: 5.6 g/dL — AB (ref 6.0–8.3)
Total Bilirubin: 1.4 mg/dL — ABNORMAL HIGH (ref 0.3–1.2)

## 2014-09-14 LAB — CBC
HCT: 29.5 % — ABNORMAL LOW (ref 39.0–52.0)
HEMOGLOBIN: 9.9 g/dL — AB (ref 13.0–17.0)
MCH: 29.3 pg (ref 26.0–34.0)
MCHC: 33.6 g/dL (ref 30.0–36.0)
MCV: 87.3 fL (ref 78.0–100.0)
PLATELETS: 204 10*3/uL (ref 150–400)
RBC: 3.38 MIL/uL — ABNORMAL LOW (ref 4.22–5.81)
RDW: 18.6 % — ABNORMAL HIGH (ref 11.5–15.5)
WBC: 8.2 10*3/uL (ref 4.0–10.5)

## 2014-09-14 LAB — GLUCOSE, CAPILLARY
GLUCOSE-CAPILLARY: 180 mg/dL — AB (ref 70–99)
GLUCOSE-CAPILLARY: 144 mg/dL — AB (ref 70–99)
Glucose-Capillary: 135 mg/dL — ABNORMAL HIGH (ref 70–99)

## 2014-09-14 MED ORDER — TOBRAMYCIN 0.3 % OP SOLN: 2.0000 [drp] | Freq: Four times a day (QID) | OPHTHALMIC | Status: DC

## 2014-09-14 MED ORDER — LEVOFLOXACIN 750 MG PO TABS: 750.0000 mg | ORAL_TABLET | ORAL | Status: DC

## 2014-09-14 MED ORDER — THIAMINE HCL 100 MG PO TABS: 100.0000 mg | ORAL_TABLET | Freq: Every day | ORAL | Status: DC

## 2014-09-14 MED ORDER — CARVEDILOL 3.125 MG PO TABS: 3.1250 mg | ORAL_TABLET | Freq: Two times a day (BID) | ORAL | Status: DC

## 2014-09-14 MED ORDER — FOLIC ACID 1 MG PO TABS: 1.0000 mg | ORAL_TABLET | Freq: Every day | ORAL | Status: DC

## 2014-09-14 MED ORDER — CARVEDILOL 3.125 MG PO TABS
3.1250 mg | ORAL_TABLET | Freq: Two times a day (BID) | ORAL | Status: DC
  Administered 2014-09-14: 3.125 mg via ORAL
  Filled 2014-09-14 (×3): qty 1

## 2014-09-14 MED ORDER — TORSEMIDE 20 MG PO TABS: 40.0000 mg | ORAL_TABLET | Freq: Every day | ORAL | Status: DC

## 2014-09-14 MED ORDER — AMIODARONE HCL 200 MG PO TABS: 200.0000 mg | ORAL_TABLET | Freq: Every day | ORAL | Status: DC

## 2014-09-14 MED ORDER — APIXABAN 5 MG PO TABS: 5.0000 mg | ORAL_TABLET | Freq: Two times a day (BID) | ORAL | Status: DC

## 2014-09-14 NOTE — Progress Notes (Signed)
cpap order discontinued by Dr. Sherral Hammers on 09-13-14. Machine removed from the room.

## 2014-09-14 NOTE — Progress Notes (Signed)
Advanced Heart Failure Rounding Note   Subjective:    [name] is a 79 yo male with a history of HTN, CAD s/p CABG, ICM s/p Medtronic ICD, chronic systolic heart failure, DM2, Vtach, atrial flutter, PVD and GI mass. ECHO (12/22/13) EF 20-25%, Echo EF 40% (12/15) Mod-severe MR.   Most recent admit with fatigue and dyspnea. Found to be anemic. Received 2UPRBCs. Had EGD that showed candidal esophagitis. Discharged on diflucan.   Admitted April 16th with fever and hypotension. Found to have PNA. Weight down 3 pounds.  Yesterday torsemide was restarted.    Denies SOB/PND/Orthopnea.    Blood cultures --> NGTD.  Urine Culture Negative  C diff- negative CXR : RUL PNA   Objective:   Weight Range:  Vital Signs:   Temp:  [97.9 F (36.6 C)-98.3 F (36.8 C)] 98.1 F (36.7 C) (04/21 0400) Pulse Rate:  [64-81] 73 (04/21 0600) Resp:  [9-27] 9 (04/21 0600) BP: (93-127)/(38-75) 124/75 mmHg (04/21 0600) SpO2:  [85 %-100 %] 100 % (04/21 0600) Weight:  [147 lb 11.3 oz (67 kg)] 147 lb 11.3 oz (67 kg) (04/21 0500) Last BM Date: 09/13/14  Weight change: Filed Weights   09/12/14 0500 09/13/14 0332 09/14/14 0500  Weight: 152 lb 1.9 oz (69 kg) 150 lb 12.7 oz (68.4 kg) 147 lb 11.3 oz (67 kg)    Intake/Output:   Intake/Output Summary (Last 24 hours) at 09/14/14 2725 Last data filed at 09/14/14 0500  Gross per 24 hour  Intake    580 ml  Output   1900 ml  Net  -1320 ml     Physical Exam: General: Chronically ill appearing. No resp difficulty. In bed. Marland Kitchen  HEENT: normal Neck: supple. JVP 5-6 Carotids 2+ bilaterally; no bruits. No lymphadenopathy or thryomegaly appreciated. RIJ  Cor: PMI normal. Regular rate & rhythm. No rubs, gallops. 2/6 HSM apex.  Lungs: EW through out. On 2 liters [place] oxygen.  Abdomen: soft, nontender, mildly distended. No hepatosplenomegaly. No bruits or masses. Good bowel sounds. Extremities: no cyanosis, clubbing, rash, 1+ ankle edema Ted hose in place  Neuro:  alert & orientedx3, cranial nerves grossly intact. Moves all 4 extremities w/o difficulty. Affect pleasant.  Telemetry: SR 60-70s   Labs: Basic Metabolic Panel:  Recent Labs Lab 09/10/14 0558 09/11/14 0216 09/12/14 0410 09/13/14 1115 09/14/14 0409  NA 129* 128* 127* 129* 130*  K 3.7 3.4* 3.8 3.4* 3.6  CL 94* 92* 92* 91* 89*  CO2 22 26 25 28 27   GLUCOSE 135* 108* 115* 235* 110*  BUN 25* 25* 25* 23 22  CREATININE 2.02* 2.15* 1.91* 1.94* 1.90*  CALCIUM 7.1* 7.2* 7.4* 7.8* 7.3*    Liver Function Tests:  Recent Labs Lab 09/09/14 1723 09/10/14 0558 09/11/14 0216 09/12/14 0410 09/14/14 0409  AST 51* 64* 54* 47* 70*  ALT 21 21 19 19 24   ALKPHOS 101 118* 107 111 174*  BILITOT 2.6* 2.1* 1.6* 1.6* 1.4*  PROT 5.4* 5.3* 5.2* 5.1* 5.6*  ALBUMIN 1.9* 1.9* 1.7* 1.7* 1.8*   No results for input(s): LIPASE, AMYLASE in the last 168 hours.  Recent Labs Lab 09/09/14 1515 09/11/14 0216  AMMONIA 49* 31    CBC:  Recent Labs Lab 09/09/14 1723 09/10/14 0110 09/11/14 0216 09/12/14 0410 09/13/14 0221 09/14/14 0409  WBC 11.9* 16.0* 11.4* 9.6 8.4 8.2  NEUTROABS 9.3*  --   --   --   --   --   HGB 9.8* 10.5* 9.5* 9.4* 10.0* 9.9*  HCT 29.7*  31.9* 28.4* 27.8* 30.2* 29.5*  MCV 88.4 89.6 87.7 87.1 87.8 87.3  PLT 142* 166 153 148* 170 204    Cardiac Enzymes: No results for input(s): CKTOTAL, CKMB, CKMBINDEX, TROPONINI in the last 168 hours.  BNP: BNP (last 3 results)  Recent Labs  06/07/14 1421 08/30/14 1639 09/09/14 1150  BNP 528.4* 800.7* 550.7*    ProBNP (last 3 results)  Recent Labs  01/24/14 1909 02/09/14 0945  PROBNP 6721.0* 2555.0*      Other results:  Imaging: No results found.   Medications:     Scheduled Medications: . amiodarone  200 mg Oral Daily  . apixaban  5 mg Oral BID  . dextromethorphan-guaiFENesin  1 tablet Oral BID  . fluconazole  100 mg Oral Daily  . folic acid  1 mg Oral Daily  . insulin aspart  0-9 Units Subcutaneous TID WC   . ipratropium-albuterol  3 mL Nebulization Q6H  . levofloxacin  750 mg Oral Q48H  . levothyroxine  75 mcg Oral QAC breakfast  . thiamine  100 mg Oral Daily  . tobramycin  2 drop Both Eyes 4 times per day  . torsemide  40 mg Oral Daily    Infusions:    PRN Medications: acetaminophen, ondansetron **OR** ondansetron (ZOFRAN) IV   Assessment:  1. Fever/PNA 2. Hypotension 3. Chronic Systolic HF- ICM- ECHO 12/25/9560 EF 40-45%  4. PAF on chronic amiodarone and eliquis 5. DM 6. H/O VT  7. Anemia  8. Chronic Respiratory Failure- on 2 liters [place] at home  9. Hypothroid 10. CKD   Plan/Discussion:    Admitted with fever and hypotension.--> PNAAfebrile over night. WBC down form 16>11>9.6>8.4   Urine negative. Blood cultures NGTD. On levaquin per primary team.   TEE deferred for now.   Add 3.125 carvedilol twice a day. Back on torsemide 40 mg daily. No Ace/Spir with CKD. Weight down 3 pounds.     Remains on eliquis 5 mg twice a day. Continue  amio 200 mg daily .  EGD-last admit with esophageal candidasis. On Fluconazone.   PT recommending home health.   HF Meds for D/C.   Follow up in HF clinic already set up.  Torsemide 40 mg daily  Amiodarone 200 mg daily Apixaban 5 mg twice a day Carvedilol 3.125 mg twice a day  Length of Stay: 5   CLEGG,AMY NP-C 09/14/2014, 7:22 AM  Advanced Heart Failure Team Pager [phone] (M-F; 7a - 4p)  Please contact [facility] for night-coverage after hours (4p -7a ) and weekends on [email or web address]  Agree. He is stable for d/c.  Daniel Bensimhon,MD 11:57 PM

## 2014-09-14 NOTE — Progress Notes (Signed)
Report given to June Leap; pt transferred with tele, belongings, meds; MD aware

## 2014-09-14 NOTE — Discharge Summary (Signed)
Physician Discharge Summary  [name] GHW:[id number] DOB: 07-10-1935 DOA: 09/09/2014  PCP: Tammi Sou, MD  Admit date: 09/09/2014 Discharge date: 09/14/2014  Time spent: 40 minutes  Recommendations for Outpatient Follow-up:  Newly appreciated right lung HCAP - Metabolic encephalopathy/COPD exacerbation?  -follow-up chest x-ray noted right upper lobe infiltrate. Secondary to patient's multiple medical problems would have PCP reimage in 2-4 weeks to ensure resolution.  -Complete ten-day course of antibiotics -Follow-up with PCP  OSA? -Patient has been informed to most likely has OSA however noted officially tested -PCP to schedule spirometry pre-/post bronchodilation, DLCO and sleep study.  Transaminitis -I believe phosphatase and AST still elevated. PCP to monitor  -acute viral hepatitis panel negative - steadily improving  Ischemic cardiomyopathy - severe systolic heart failure -No evidence of acute exacerbation this time - discharge weight 4/12 was 68 kg - currently at 67.5 kg - Follow-up with Dr. Shaune Pascal Bensimhon, at the heart failure clinic in 7 days   Coronary artery disease status post CABG -Cardiology following - no evidence of acute issues  Paroxysmal atrial fibrillation -On chronic amiodarone and eliquis   Severe pulmonary hypertension -Patient would benefit from an afterload reducing agent if his blood pressure were tolerated, will allow CHF clinic to determine when/if patient should be started on nitrate.  Mitral regurgitation - moderate to severe -Not a candidate for surgical intervention per cardiology  Chronic hyponatremia -Due to severe heart failure - stable   Acute on chronic renal failure Chronic kidney disease with baseline creatinine 1.35-2.03 -Renal function is presently at baseline  Diabetes mellitus controlled -CBG currently well-controlled - continue to follow  Hypothyroidism -Continue home medical therapy  Recent esophageal  candidiasis -Course of Diflucan therapy completed.  Normocytic anemia -Anemia panel most consistent with "anemia of chronic disease" -PCP to monitor     Discharge Diagnoses:  Principal Problem:   Fever Active Problems:   Carotid artery disease   Automatic implantable cardioverter-defibrillator in situ   Atrial flutter   COPD, severe   Chronic systolic CHF (congestive heart failure), NYHA class 4   Type 2 diabetes mellitus, uncontrolled   CKD (chronic kidney disease) stage 3, GFR 30-59 ml/min   Iron deficiency anemia due to chronic blood loss   Fatigue   Candida esophagitis   Chronic hyponatremia   Metabolic encephalopathy   FUO (fever of unknown origin)   HCAP (healthcare-associated pneumonia)   COPD exacerbation   Obstructive sleep apnea   Transaminitis   Cardiomyopathy, ischemic   Chronic systolic congestive heart failure   Paroxysmal atrial fibrillation   Acute on chronic renal failure   Diabetes type 2, controlled   Other specified hypothyroidism   Esophageal candidiasis   Normocytic anemia   OSA (obstructive sleep apnea)   CAD in native artery   Pulmonary hypertension   Severe mitral regurgitation   Discharge Condition: Stable  Diet recommendation: Heart healthy/carb modified  Filed Weights   09/12/14 0500 09/13/14 0332 09/14/14 0500  Weight: 69 kg (152 lb 1.9 oz) 68.4 kg (150 lb 12.7 oz) 67 kg (147 lb 11.3 oz)    History of present illness:  [name] PMHx diabetes type 2 controlled, chronic systolic heart failure/ischemic cardiomyopathy moderate to severe mitral regurgitation,, paroxysmal atrial fibrillation on amiodarone and eliquis, CAD, Hx ventricular tachycardia post ICD, chronic respiratory failure on 2 L O2 via [place] at night (per patient/daughter PCP never arranged for O2 to be started), COPD, hypothyroidism, anemia. Discharged on 4/12 after admission for an acute systolic heart failure  decompensation. During that admission patient had  cardiogenic shock and required milrinone and norepinephrine. He also underwent right heart catheterization. He had anemia prompting an EGD that demonstrated esophageal candidiasis and was prescribed 10 days of fluconazole. His Lasix was changed to torsemide. He diuresed 12 pounds during that admission.  On 4/16 the patient presented to the ER with reports of fatigue and fever. He was listless and had a difficult time remaining awake to provide history. He denied diarrhea or abdominal pain. Patient had been prescribed Diflucan to take for the esophageal candidiasis but wife reports [facility] had not filled this prescription because they were concerned about drug drug interactions and had been unable to contact the prescribing provider for clarification.   Upon presentation to the ER the patient had a fever of 101.4. His blood pressure was 110/48. His room-air saturations were 97%. Chest x-ray and urinalysis were unremarkable. Sodium was slightly lower than baseline at 129. BUN 27 creatinine 2.05. Alkaline phosphatase 124, AST 65 and total bilirubin 2.8. Lactic acid was normal at 1.66 and troponin was normal 0.03. BNP was 550 noting his BNP on 4/6 was 800 and in the past has peaked as high as 6721. During patient's stay patient was found to have HCAP and was treated appropriately. In addition patient was treated for COPD exacerbation, and OSA. Patient will require outpatient spirometry to officially document his OSA. Patient was found to require O2 24 hours a day 2 L via [place]. Patient's acute on chronic renal failure returned to baseline with resolution of his HCAP/ COPD exacerbation.    Consultants: Dr.Daniel R Bensimhon (cardiology CHF)  Procedure/Significant Events: 4/7 echocardiogram;- Left ventricle: mild LVH.- LVEF= 40% to 45%. There is hypokinesis of the basal-midinferoseptal myocardium. - (grade 2diastolic dysfunction). - Mitral valve: moderate to severe regurgitation. - Left atrium:  moderately to severely dilated. - Right ventricle: moderately dilated.  - Right atrium: moderately to severely dilated. - Tricuspid valve moderate regurgitation. - Pulmonary arteries: PA peak pressure: 78 mm Hg (S).   Culture 4/16 MRSA by PCR positive 4/16 blood right arm/hand NGTD  4/16 urine negative 4/16 blood central line/left antecubital NGTD 4/18 C. difficile by PCR negative  Antibiotics: Diflucan or/16>> stopped 4/19 Zosyn 4/16>> stopped 4/19 Vancomycin 4/16>> stopped 4/19 Levaquin 4/20>>    Discharge Exam: Filed Vitals:   09/14/14 0600 09/14/14 0752 09/14/14 0954 09/14/14 1405  BP: 124/75 120/53  114/55  Pulse: 73 73  80  Temp:  97.8 F (36.6 C)  97.9 F (36.6 C)  TempSrc:  Oral  Oral  Resp: 9 12  16   Height:      Weight:      SpO2: 100% 94% 93% 99%    General: A/O 4, NAD, negative Acute respiratory distress, Lungs: clear to auscultation bilateral  Cardiovascular: Regular rate and rhythm without murmur gallop or rub normal S1 and S2 Abdomen: Nontender, nondistended, soft, bowel sounds positive, no rebound, no ascites, no appreciable mass Extremities: No significant cyanosis, clubbing, or edema bilateral lower extremities  Discharge Instructions     Medication List    STOP taking these medications        fluconazole 100 MG tablet  Commonly known as:  DIFLUCAN     furosemide 20 MG tablet  Commonly known as:  LASIX     guaiFENesin 100 MG/5ML liquid  Commonly known as:  ROBITUSSIN     hydrALAZINE 25 MG tablet  Commonly known as:  APRESOLINE     insulin lispro 100 UNIT/ML KiwkPen  Commonly known as:  HUMALOG KWIKPEN     isosorbide mononitrate 30 MG 24 hr tablet  Commonly known as:  IMDUR     metolazone 2.5 MG tablet  Commonly known as:  ZAROXOLYN     potassium chloride 10 MEQ tablet  Commonly known as:  K-DUR     pravastatin 80 MG tablet  Commonly known as:  PRAVACHOL      TAKE these medications        amiodarone 200 MG tablet   Commonly known as:  PACERONE  Take 1 tablet (200 mg total) by mouth daily.     apixaban 5 MG Tabs tablet  Commonly known as:  ELIQUIS  Take 1 tablet (5 mg total) by mouth 2 (two) times daily.     carvedilol 3.125 MG tablet  Commonly known as:  COREG  Take 1 tablet (3.125 mg total) by mouth 2 (two) times daily with a meal.     Fluticasone-Salmeterol 500-50 MCG/DOSE Aepb  Commonly known as:  ADVAIR  Inhale 1 puff into the lungs 2 (two) times daily.     folic acid 1 MG tablet  Commonly known as:  FOLVITE  Take 1 tablet (1 mg total) by mouth daily.     Insulin Glargine 100 UNIT/ML Solostar Pen  Commonly known as:  LANTUS SOLOSTAR  10 units SQ qhs     levofloxacin 750 MG tablet  Commonly known as:  LEVAQUIN  Take 1 tablet (750 mg total) by mouth every other day.     levothyroxine 75 MCG tablet  Commonly known as:  SYNTHROID, LEVOTHROID  Take 1 tablet (75 mcg total) by mouth daily.     pantoprazole 40 MG tablet  Commonly known as:  PROTONIX  Take 1 tablet (40 mg total) by mouth 2 (two) times daily.     thiamine 100 MG tablet  Take 1 tablet (100 mg total) by mouth daily.     tiotropium 18 MCG inhalation capsule  Commonly known as:  SPIRIVA  Place 18 mcg into inhaler and inhale every evening.     tobramycin 0.3 % ophthalmic solution  Commonly known as:  TOBREX  Place 2 drops into both eyes every 6 (six) hours.     torsemide 20 MG tablet  Commonly known as:  DEMADEX  Take 2 tablets (40 mg total) by mouth daily.       Allergies  Allergen Reactions  . Niacin Itching    Niaspan    Follow-up Information    Follow up with Glori Bickers, MD On 09/20/2014.   Specialty:  Cardiology   Why:  at [facility] in the [facility]--gate code [place] --please bring all medications to appt   Contact information:   [place] [address] [phone]       Follow up with Tammi Sou, MD. Schedule an appointment as soon as possible  for a visit in 2 weeks.   Specialty:  Family Medicine   Why:  Hospital follow-up HCAP/COPD exacerbation reimage in 2-4 weeks to ensure resolution.    Contact information:   [address] [place] [address] [phone]        The results of significant diagnostics from this hospitalization (including imaging, microbiology, ancillary and laboratory) are listed below for reference.    Significant Diagnostic Studies: Dg Chest 2 View  09/10/2014   CLINICAL DATA:  Fever of unknown origin.  EXAM: CHEST  2 VIEW  COMPARISON:  09/09/2014  FINDINGS: The cardiac silhouette, mediastinal and hilar contours are stable. Stable surgical changes from bypass surgery. The pacer wires/ AICD are stable. There are chronic lung changes with calcified pleural plaques bilaterally. Increasing airspace opacity in the right upper lobe is suspicious for pneumonia. No pleural effusions.  IMPRESSION: Chronic lung changes with calcified pleural plaques.  Slightly progressive airspace opacity in the right upper lobe suspicious for pneumonia.   Electronically Signed   By: Marijo Sanes M.D.   On: 09/10/2014 07:51   Dg Chest 2 View  09/09/2014   CLINICAL DATA:  Pt states weakness x 2 days. Denies any CP, SOB, cough, fever, or dizziness. H/o quadrupl bypass surgery 20+ years ago, defibrillator placed in 2006, h/o COPD, diabetes  EXAM: CHEST  2 VIEW  COMPARISON:  08/31/2014  FINDINGS: Changes from CABG surgery are stable. Cardiac silhouette is mildly enlarged. No mediastinal or hilar masses. Left anterior chest wall AICD is stable all position.  Lungs are hyperexpanded. There are mildly thickened interstitial markings bilaterally. No lung consolidation or edema. No pleural effusion or pneumothorax.  Bony thorax is demineralized but grossly intact.  IMPRESSION: No acute cardiopulmonary disease.   Electronically Signed   By: Lajean Manes M.D.   On: 09/09/2014 12:43   Dg Chest Port 1 View  09/11/2014   CLINICAL DATA:  CHF,  pneumonia, history of CABG, chronic lung disease  EXAM: PORTABLE CHEST - 1 VIEW  COMPARISON:  tion PA and lateral chest of September 10, 2014  FINDINGS: The lungs remain hyperinflated. The interstitial markings remain coarse bilaterally. Confluent density in the right upper lobe persists but has not progressed significantly since the previous study. Pleural plaques are present bilaterally. There is no significant pleural effusion, and there is no pneumothorax. The cardiac silhouette is enlarged. The permanent pacemaker defibrillator is unchanged in position. The pulmonary vascularity is not engorged. There are 8 intact sternal wires.  IMPRESSION: Airspace opacity in the right upper lobe suggests pneumonia. There is underlying chronic interstitial lung disease with bilateral calcified pleural plaques. There is no evidence of CHF currently.   Electronically Signed   By: David  Martinique   On: 09/11/2014 07:18   Dg Chest Port 1 View  09/09/2014   CLINICAL DATA:  Patient with acute onset fever.  Sepsis.  EXAM: PORTABLE CHEST - 1 VIEW  COMPARISON:  Chest radiograph 09/09/2014  FINDINGS: Multi lead AICD device overlies the left hemi thorax, leads appear stable in position. Stable enlarged cardiac and mediastinal contours status post median sternotomy and CABG procedure. Heterogeneous opacities are demonstrated throughout the left mid lung and right mid lung. No definite pleural effusion or pneumothorax. Calcified pleural plaques.  IMPRESSION: Bilateral left-greater-than-right mid lung heterogeneous opacities may represent infection in the appropriate clinical setting. A portion of these opacities may be secondary to calcified pleural plaques. Recommend continued radiographic followup.   Electronically Signed   By: Lovey Newcomer M.D.   On: 09/09/2014 16:58   Dg Chest Port 1 View  08/31/2014   CLINICAL DATA:  Central line placement, history hypertension, coronary artery disease, carotid artery disease, chronic renal  insufficiency stage III, type 2 diabetes, former smoker  EXAM: PORTABLE CHEST - 1 VIEW  COMPARISON:  [facility] portable exam 1439 hours compared to 0440 hours  FINDINGS: New RIGHT jugular central venous catheter with tip projecting over SVC.  LEFT subclavian pacemaker/AICD leads with tips projecting over RIGHT ventricle.  Enlargement of cardiac silhouette post CABG with pulmonary vascular congestion.  Mild perihilar infiltrates slightly greater  on LEFT favor pulmonary edema.  No pleural effusion or pneumothorax.  Atherosclerotic calcification aorta.  IMPRESSION: No pneumothorax following RIGHT jugular line placement.  Mild pulmonary edema/CHF.   Electronically Signed   By: Lavonia Dana M.D.   On: 08/31/2014 15:05   Dg Chest Port 1 View  08/31/2014   CLINICAL DATA:  Dyspnea  EXAM: PORTABLE CHEST - 1 VIEW  COMPARISON:  Portable chest x-ray of January 25, 2014  FINDINGS: The lungs are well-expanded. The interstitial markings are mildly increased diffusely. There is stable pleural scarring peripherally in the left mid hemithorax. The cardiac silhouette is enlarged. The central pulmonary vascularity is prominent. The permanent pacemaker defibrillator is unchanged in appearance. The patient has undergone previous CABG.  IMPRESSION: COPD with superimposed mild CHF.  There is no pneumonia.   Electronically Signed   By: David  Martin[place]   On: 08/31/2014 07:26    Microbiology: Recent Results (from the past 240 hour(s))  Culture, blood (routine x 2)     Status: None (Preliminary result)   Collection Time: 09/09/14 11:55 AM  Result Value Ref Range Status   Specimen Description BLOOD RIGHT ARM  Final   Special Requests BOTTLES DRAWN AEROBIC AND ANAEROBIC 10 CC  Final   Culture   Final           BLOOD CULTURE RECEIVED NO GROWTH TO DATE CULTURE WILL BE HELD FOR 5 DAYS BEFORE ISSUING A FINAL NEGATIVE REPORT Performed at [facility]    Report Status PENDING  Incomplete  Culture, blood (routine x 2)     Status: None  (Preliminary result)   Collection Time: 09/09/14 12:00 PM  Result Value Ref Range Status   Specimen Description BLOOD RIGHT HAND  Final   Special Requests BOTTLES DRAWN AEROBIC ONLY 5 CC  Final   Culture   Final           BLOOD CULTURE RECEIVED NO GROWTH TO DATE CULTURE WILL BE HELD FOR 5 DAYS BEFORE ISSUING A FINAL NEGATIVE REPORT Performed at [facility]    Report Status PENDING  Incomplete  Urine culture     Status: None   Collection Time: 09/09/14  1:53 PM  Result Value Ref Range Status   Specimen Description URINE, CLEAN CATCH  Final   Special Requests NONE  Final   Colony Count NO GROWTH Performed at [facility]   Final   Culture NO GROWTH Performed at [facility]   Final   Report Status 09/11/2014 FINAL  Final  MRSA PCR Screening     Status: None   Collection Time: 09/09/14  4:28 PM  Result Value Ref Range Status   MRSA by PCR NEGATIVE NEGATIVE Final    Comment:        The GeneXpert MRSA Assay (FDA approved for NASAL specimens only), is one component of a comprehensive MRSA colonization surveillance program. It is not intended to diagnose MRSA infection nor to guide or monitor treatment for MRSA infections.   Culture, blood (x 2)     Status: None (Preliminary result)   Collection Time: 09/09/14  5:17 PM  Result Value Ref Range Status   Specimen Description BLOOD CENTRAL LINE  Final   Special Requests BOTTLES DRAWN AEROBIC AND ANAEROBIC 5CC  Final   Culture   Final           BLOOD CULTURE RECEIVED NO GROWTH TO DATE CULTURE WILL BE HELD FOR 5 DAYS BEFORE ISSUING A FINAL NEGATIVE REPORT Performed at  [facility]    Report Status PENDING  Incomplete  Culture, blood (x 2)     Status: None (Preliminary result)   Collection Time: 09/09/14  5:23 PM  Result Value Ref Range Status   Specimen Description BLOOD LEFT ANTECUBITAL  Final   Special Requests BOTTLES DRAWN AEROBIC AND ANAEROBIC 10CC  Final   Culture   Final           BLOOD  CULTURE RECEIVED NO GROWTH TO DATE CULTURE WILL BE HELD FOR 5 DAYS BEFORE ISSUING A FINAL NEGATIVE REPORT Performed at [facility]    Report Status PENDING  Incomplete  Clostridium Difficile by PCR     Status: None   Collection Time: 09/11/14  5:23 AM  Result Value Ref Range Status   C difficile by pcr NEGATIVE NEGATIVE Final     Labs: Basic Metabolic Panel:  Recent Labs Lab 09/10/14 0558 09/11/14 0216 09/12/14 0410 09/13/14 1115 09/14/14 0409  NA 129* 128* 127* 129* 130*  K 3.7 3.4* 3.8 3.4* 3.6  CL 94* 92* 92* 91* 89*  CO2 22 26 25 28 27   GLUCOSE 135* 108* 115* 235* 110*  BUN 25* 25* 25* 23 22  CREATININE 2.02* 2.15* 1.91* 1.94* 1.90*  CALCIUM 7.1* 7.2* 7.4* 7.8* 7.3*   Liver Function Tests:  Recent Labs Lab 09/09/14 1723 09/10/14 0558 09/11/14 0216 09/12/14 0410 09/14/14 0409  AST 51* 64* 54* 47* 70*  ALT 21 21 19 19 24   ALKPHOS 101 118* 107 111 174*  BILITOT 2.6* 2.1* 1.6* 1.6* 1.4*  PROT 5.4* 5.3* 5.2* 5.1* 5.6*  ALBUMIN 1.9* 1.9* 1.7* 1.7* 1.8*   No results for input(s): LIPASE, AMYLASE in the last 168 hours.  Recent Labs Lab 09/09/14 1515 09/11/14 0216  AMMONIA 49* 31   CBC:  Recent Labs Lab 09/09/14 1723 09/10/14 0110 09/11/14 0216 09/12/14 0410 09/13/14 0221 09/14/14 0409  WBC 11.9* 16.0* 11.4* 9.6 8.4 8.2  NEUTROABS 9.3*  --   --   --   --   --   HGB 9.8* 10.5* 9.5* 9.4* 10.0* 9.9*  HCT 29.7* 31.9* 28.4* 27.8* 30.2* 29.5*  MCV 88.4 89.6 87.7 87.1 87.8 87.3  PLT 142* 166 153 148* 170 204   Cardiac Enzymes: No results for input(s): CKTOTAL, CKMB, CKMBINDEX, TROPONINI in the last 168 hours. BNP: BNP (last 3 results)  Recent Labs  06/07/14 1421 08/30/14 1639 09/09/14 1150  BNP 528.4* 800.7* 550.7*    ProBNP (last 3 results)  Recent Labs  01/24/14 1909 02/09/14 0945  PROBNP 6721.0* 2555.0*    CBG:  Recent Labs Lab 09/13/14 1111 09/13/14 1653 09/13/14 2216 09/14/14 0750 09/14/14 1116  GLUCAP 199* 127*  152* 144* 135*       Signed:  Dia Crawford, MD [facility] [phone] pager

## 2014-09-14 NOTE — Telephone Encounter (Signed)
Confirmed remote transmission w/ pt wife. Pt will send transmission when he is home from hospital and feeling better.

## 2014-09-14 NOTE — Progress Notes (Signed)
Patient discharged to home with instructions, home O2 and walker for home use sent with patient, transported by wife.

## 2014-09-15 ENCOUNTER — Encounter: Payer: Self-pay | Admitting: Cardiology

## 2014-09-15 LAB — CULTURE, BLOOD (ROUTINE X 2)
Culture: NO GROWTH
Culture: NO GROWTH

## 2014-09-16 LAB — CULTURE, BLOOD (ROUTINE X 2)
Culture: NO GROWTH
Culture: NO GROWTH

## 2014-09-17 ENCOUNTER — Other Ambulatory Visit: Payer: Self-pay | Admitting: Pulmonary Disease

## 2014-09-18 ENCOUNTER — Telehealth (HOSPITAL_COMMUNITY): Payer: Self-pay

## 2014-09-18 ENCOUNTER — Other Ambulatory Visit: Payer: Self-pay | Admitting: Family Medicine

## 2014-09-18 MED ORDER — LEVOTHYROXINE SODIUM 75 MCG PO TABS: 75.0000 ug | ORAL_TABLET | Freq: Every day | ORAL | Status: DC

## 2014-09-18 NOTE — Telephone Encounter (Signed)
Patient reports 1 lb weight gain and bilateral ankle and feet edema overnight.  No change in diet or fluid.  Took a metolazone about an hr ago without any result.  Per Dr. Aundra Dubin, advised to keep extremities elevated and if urine output does not increase to take an extra pm tablet of torsemide around 4 pm.  Patient has apt to see [place] this Wednesday, no sooner apt available.  Advised if this does not help or if his symptoms become worse, swelling gets worse, wt goes up more, or he has trouble breathing to call us back.  Patient aware and agreeable to plan.  [name]

## 2014-09-19 ENCOUNTER — Encounter (HOSPITAL_COMMUNITY): Payer: PRIVATE HEALTH INSURANCE

## 2014-09-19 ENCOUNTER — Telehealth: Payer: Self-pay | Admitting: Family Medicine

## 2014-09-19 NOTE — Telephone Encounter (Signed)
Patient wants to know if he should keep taking his levothyroxine. He wasn't sure why there were no refills. Please call.

## 2014-09-19 NOTE — Telephone Encounter (Signed)
Sent rx yesterday to [facility].  Pt aware.

## 2014-09-20 ENCOUNTER — Ambulatory Visit (HOSPITAL_COMMUNITY)
Admit: 2014-09-20 | Discharge: 2014-09-20 | Disposition: A | Discharge status: Home / Self Care | Payer: Medicare Other | Source: Ambulatory Visit | Attending: Internal Medicine | Admitting: Internal Medicine

## 2014-09-20 ENCOUNTER — Encounter (HOSPITAL_COMMUNITY): Payer: Self-pay

## 2014-09-20 ENCOUNTER — Telehealth: Payer: Self-pay | Admitting: Family Medicine

## 2014-09-20 VITALS — BP 102/56 | HR 70 | Wt 145.8 lb

## 2014-09-20 DIAGNOSIS — I340 Nonrheumatic mitral (valve) insufficiency: Secondary | ICD-10-CM

## 2014-09-20 DIAGNOSIS — I4891 Unspecified atrial fibrillation: Secondary | ICD-10-CM | POA: Diagnosis not present

## 2014-09-20 DIAGNOSIS — E119 Type 2 diabetes mellitus without complications: Secondary | ICD-10-CM | POA: Insufficient documentation

## 2014-09-20 DIAGNOSIS — I5022 Chronic systolic (congestive) heart failure: Secondary | ICD-10-CM | POA: Diagnosis not present

## 2014-09-20 DIAGNOSIS — I2510 Atherosclerotic heart disease of native coronary artery without angina pectoris: Secondary | ICD-10-CM | POA: Insufficient documentation

## 2014-09-20 DIAGNOSIS — J449 Chronic obstructive pulmonary disease, unspecified: Secondary | ICD-10-CM | POA: Diagnosis not present

## 2014-09-20 DIAGNOSIS — N183 Chronic kidney disease, stage 3 (moderate): Secondary | ICD-10-CM | POA: Insufficient documentation

## 2014-09-20 LAB — BASIC METABOLIC PANEL
Anion gap: 11 (ref 5–15)
BUN: 29 mg/dL — ABNORMAL HIGH (ref 6–23)
CALCIUM: 7.7 mg/dL — AB (ref 8.4–10.5)
CO2: 28 mmol/L (ref 19–32)
CREATININE: 2.05 mg/dL — AB (ref 0.50–1.35)
Chloride: 93 mmol/L — ABNORMAL LOW (ref 96–112)
GFR calc Af Amer: 34 mL/min — ABNORMAL LOW (ref >90)
GFR, EST NON AFRICAN AMERICAN: 30 mL/min — AB (ref >90)
Glucose, Bld: 119 mg/dL — ABNORMAL HIGH (ref 70–99)
Potassium: 3.3 mmol/L — ABNORMAL LOW (ref 3.5–5.1)
SODIUM: 132 mmol/L — AB (ref 135–145)

## 2014-09-20 NOTE — Telephone Encounter (Signed)
[place] nurse called [facility] stating that pt was discharged from hospital and so far has refused to be seen by home health.  She said she just has to notify PCP.

## 2014-09-20 NOTE — Progress Notes (Signed)
Patient ID: [name], male   DOB: December 11, 1935, 79 y.o.   MRN: [id number]  PCP: Dr Ernestine Conrad GI: Dr Earlean Shawl.    HPI: [name] is a 79 yo male with a history of HTN, CAD s/p CABG, ICM s/p Medtronic ICD, chronic systolic heart failure, DM2, Vtach, atrial flutter, PVD and GI mass. ECHO (12/22/13) EF 20-25%, Echo  EF 40% (12/15)  Mod-severe MR.   On September 1st he was admitted from Dr. Rosezella [place] office with volume overload and low output heart failure. Creatinine was 2.8 on admit (baseline 1.6). Central line was placed to monitor CVP and CO-OX. Initial CO-OX was 49% and he was started on Milrinone and IV lasix. Discharge weight was 151 pounds. Creatinine was 1.42.   Saw Jeff Medoff  for possible colon mass/thickening and felt to be result of previous diverticulitis.   Was admitted 2x in April. In early April had a/c CHF with cardiorenal syndrome. Treated with milrinone and improved. Also had GI bleed at that time and EGD with candidal esophagitis. In late April admitted with PNA. Treated with abx. D/c weight was 147  Follow-up: Returns with his wife for f/u. Says he feels ok. Still weak and just able to walk in house. Has severe ankle edema. + ab bloating, No orthopnea or PND. Not taking torsemide by mistake. Weight actually down to 142-143.  No bleeding with apixaban,   Echo 12/15 EF  ~40% inferior AK moderate to severe MR. RV mildly HK Echo 4/16  EF 40-45% moderate to severe MR RV moderate dysfunction RVSP 78   ICD interrogation: Optivol Fluid index now down below threshold No AF/VT. Activity 3h/day. HR variability low  Labs 02/09/14 K 3.9 Creatinine 1.38 Pro BNP 2555  Labs 02/20/14 K 3.5 NA 127 Creatinine 1.6  03/01/14 TSH 19.8  T4  1.03 T3 84  Labs 03/03/14 CEA 9.0  K 4.3 creatinine 1.2 NA 134 Hgb 12.7 TSH 19.8 T3 84.9 T4 1.03   CT Virtual Colonoscopy- Diverticulosis ? Rectosigmoid Colon Ca  ROS: All systems negative except as listed in HPI, PMH and Problem List.  Past Medical  History  Diagnosis Date  . VENTRICULAR TACHYCARDIA   . PERIPHERAL VASCULAR DISEASE   . HYPERTENSION     Hypertensive retinopathy-grade II OU  . HYPERCHOLESTEROLEMIA   . CORONARY ARTERY DISEASE     Hx of CABG  . Chronic systolic heart failure     Ischemic cardiomyopathy (01/2014 EF 40-45% with hypokinesis + small area of akinesis  . SEBORRHEIC DERMATITIS   . Chronic renal insufficiency, stage III (moderate)     CrCl 40s-50s  . DIVERTICULOSIS OF COLON   . COPD, severe   . COLONIC POLYPS   . ANXIETY   . ICD (implantable cardiac defibrillator) in place   . Type II diabetes mellitus     +background diabetic retinopathy OU  . Chronic lower back pain   . DJD (degenerative joint disease)     "hands" (05/24/2012)  . Osteoarthritis of finger   . History of atrial flutter   . Thrombus 12/2013    on ICD lead--started anticoag and his cardioversion for atrial flutter was postponed.  . Colon stricture 2015    with features worrisome for mass, also with recent rising CEA level (possible colon cancer)-GI MD= Dr. Franki Cabot recent eval/follow up with Dr. Earlean Shawl was summer 2014, at which time he recommended colonoscopy but pt declined.  . Macular degeneration, age related, nonexudative     OU    Current Outpatient  Prescriptions  Medication Sig Dispense Refill  . amiodarone (PACERONE) 200 MG tablet Take 1 tablet (200 mg total) by mouth daily. 30 tablet 0  . apixaban (ELIQUIS) 5 MG TABS tablet Take 1 tablet (5 mg total) by mouth 2 (two) times daily. 60 tablet 0  . carvedilol (COREG) 3.125 MG tablet Take 1 tablet (3.125 mg total) by mouth 2 (two) times daily with a meal. 60 tablet 0  . Fluticasone-Salmeterol (ADVAIR) 500-50 MCG/DOSE AEPB Inhale 1 puff into the lungs 2 (two) times daily. 60 each 11  . folic acid (FOLVITE) 1 MG tablet Take 1 tablet (1 mg total) by mouth daily. 30 tablet 0  . hydrALAZINE (APRESOLINE) 25 MG tablet Take 25 mg by mouth 3 (three) times daily.    . Insulin Glargine  (LANTUS SOLOSTAR) 100 UNIT/ML Solostar Pen 10 units SQ qhs (Patient taking differently: Inject 10 Units into the skin at bedtime. ) 15 mL 11  . isosorbide mononitrate (IMDUR) 30 MG 24 hr tablet Take 30 mg by mouth daily.    Marland Kitchen levothyroxine (SYNTHROID, LEVOTHROID) 75 MCG tablet Take 1 tablet (75 mcg total) by mouth daily. 30 tablet 3  . metolazone (ZAROXOLYN) 2.5 MG tablet Take 2.5 mg by mouth once a week. Take on mondays    . potassium chloride (K-DUR) 10 MEQ tablet Take 20 mEq by mouth daily.    Marland Kitchen SPIRIVA HANDIHALER 18 MCG inhalation capsule PLACE ONE CAPSULE INTO INHALER AND  INHALE  DAILY 30 capsule 0  . thiamine 100 MG tablet Take 1 tablet (100 mg total) by mouth daily. 30 tablet 0  . tiotropium (SPIRIVA) 18 MCG inhalation capsule Place 18 mcg into inhaler and inhale every evening.     . tobramycin (TOBREX) 0.3 % ophthalmic solution Place 2 drops into both eyes every 6 (six) hours. 5 mL 0  . torsemide (DEMADEX) 20 MG tablet Take 2 tablets (40 mg total) by mouth daily. 30 tablet 0  . levofloxacin (LEVAQUIN) 750 MG tablet Take 1 tablet (750 mg total) by mouth every other day. (Patient not taking: Reported on 09/20/2014) 3 tablet 0  . pantoprazole (PROTONIX) 40 MG tablet Take 1 tablet (40 mg total) by mouth 2 (two) times daily. (Patient not taking: Reported on 09/20/2014) 60 tablet 6   No current facility-administered medications for this encounter.     PHYSICAL EXAM: Filed Vitals:   09/20/14 0954  BP: 102/56  Pulse: 70  Weight: 145 lb 12 oz (66.112 kg)  SpO2: 95%    General:  Frail appearing No resp difficulty HEENT: normal Neck: supple. JVP 7. Carotids 2+ bilaterally; no bruits. No lymphadenopathy or thryomegaly appreciated. Cor: PMI normal. Regular rate & rhythm. No rubs, gallops or murmurs. Lungs: clear Abdomen: soft, nontender, +distended. No hepatosplenomegaly. No bruits or masses. Good bowel sounds. Extremities: no cyanosis, clubbing, rash, R and LLE 2-3+ edema Neuro: alert &  orientedx3, cranial nerves grossly intact. Moves all 4 extremities w/o difficulty. Affect pleasant.  ICD interrogation shows Optivol maxed out (doubt this is accurate). No VT/AF. Activity 3 hours  ASSESSMENT & PLAN: Mr Garrel Ridgel is a 79 year old with ischemic cardiomyopathy and chronic systolic heart failure recently admitted with cardiogenic shock that required short term milrinone. 1. Chronic Systolic Heart Failure with R> L symptoms.  Ischemic cardiomyopathy. Has Medtronic ICD. ECHO 4/16 EF 40-45%. NYHA III-IIIB -Volume status up today in setting of not taking torsemide by accident. Will restart torsemide at 40mg  daily with metolazone 2.5 on Monday.  - Continue carvedilol  to 3.125 bid - Hydralazine stopped in hospital but he is still taking 50 tid. Will cut back to 25 tid - He is not Ace or spiro in past due to CKD. Can reconsider in future if BP allows.  2. A fib/Aflutter: Maintaining SR.  - Continue amiodarone 200 mg daily. Recent LFTs ok. Will need yearly eye exams.  - Continue Eliquis 5 mg bid.  3. Mitral regurgitation - moderate to severe due to restriction of posterior MV leaflet. We discussed the possibility of TEE and possible repair. However, I think a major surgery like that could potentially be a major setback for him especially with his COPD. We will follow closely with surveillance echos and if LV dilating then will plan TEE to further evaluate.  4. CKD: Stable III. Check labs today.  5. DM: Per PCP.  6. CAD: s/p CABG.  Continue pravastatin.  Not on ASA with apixaban use.  7. COPD 8. H/o VT: He has an ICD and is now on amiodarone.  Stable  Glori Bickers MD  5:12 PM

## 2014-09-20 NOTE — Patient Instructions (Signed)
Restart Torsemide 40mg  once daily.  Follow up 1 week.  Do the following things EVERYDAY: 1) Weigh yourself in the morning before breakfast. Write it down and keep it in a log. 2) Take your medicines as prescribed 3) Eat low salt foods-Limit salt (sodium) to 2000 mg per day.  4) Stay as active as you can everyday 5) Limit all fluids for the day to less than 2 liters

## 2014-09-20 NOTE — Telephone Encounter (Signed)
Noted  

## 2014-09-21 ENCOUNTER — Ambulatory Visit (INDEPENDENT_AMBULATORY_CARE_PROVIDER_SITE_OTHER): Payer: Medicare Other | Admitting: *Deleted

## 2014-09-21 DIAGNOSIS — I5022 Chronic systolic (congestive) heart failure: Secondary | ICD-10-CM

## 2014-09-21 DIAGNOSIS — I4729 Other ventricular tachycardia: Secondary | ICD-10-CM

## 2014-09-21 DIAGNOSIS — I472 Ventricular tachycardia: Secondary | ICD-10-CM | POA: Diagnosis not present

## 2014-09-21 LAB — MDC_IDC_ENUM_SESS_TYPE_REMOTE
Battery Voltage: 3.01 V
Brady Statistic RV Percent Paced: 0.09 %
Date Time Interrogation Session: 20160428182406
HighPow Impedance: 79 Ohm
Lead Channel Impedance Value: 285 Ohm
Lead Channel Pacing Threshold Pulse Width: 0.4 ms
Lead Channel Sensing Intrinsic Amplitude: 15.625 mV
Lead Channel Setting Pacing Amplitude: 3.25 V
Lead Channel Setting Pacing Pulse Width: 0.4 ms
Lead Channel Setting Sensing Sensitivity: 0.3 mV
MDC IDC MSMT BATTERY REMAINING LONGEVITY: 123 mo
MDC IDC MSMT LEADCHNL RV IMPEDANCE VALUE: 380 Ohm
MDC IDC MSMT LEADCHNL RV PACING THRESHOLD AMPLITUDE: 1.625 V
MDC IDC SET ZONE DETECTION INTERVAL: 300 ms
MDC IDC SET ZONE DETECTION INTERVAL: 450 ms
Zone Setting Detection Interval: 430 ms

## 2014-09-22 NOTE — Progress Notes (Signed)
Remote ICD transmission.   

## 2014-09-25 ENCOUNTER — Telehealth (HOSPITAL_COMMUNITY): Payer: Self-pay

## 2014-09-25 NOTE — Telephone Encounter (Signed)
Patient's recent serum k 3.3, per Dr. Aundra Dubin advised to take extra 40 meq potassium today only, then resume as prescribed tomorrow.  Aware and agreeable.  [name]

## 2014-09-26 ENCOUNTER — Other Ambulatory Visit (HOSPITAL_COMMUNITY): Payer: Self-pay | Admitting: Adult Health

## 2014-09-27 ENCOUNTER — Other Ambulatory Visit (HOSPITAL_COMMUNITY): Payer: Self-pay | Admitting: Adult Health

## 2014-09-27 ENCOUNTER — Ambulatory Visit (HOSPITAL_COMMUNITY)
Admission: RE | Admit: 2014-09-27 | Discharge: 2014-09-27 | Disposition: A | Discharge status: Home / Self Care | Payer: Medicare Other | Source: Ambulatory Visit | Attending: Internal Medicine | Admitting: Internal Medicine

## 2014-09-27 ENCOUNTER — Ambulatory Visit: Payer: Medicare Other | Admitting: Family Medicine

## 2014-09-27 VITALS — BP 102/48 | HR 63 | Wt 151.8 lb

## 2014-09-27 DIAGNOSIS — Z794 Long term (current) use of insulin: Secondary | ICD-10-CM | POA: Insufficient documentation

## 2014-09-27 DIAGNOSIS — E119 Type 2 diabetes mellitus without complications: Secondary | ICD-10-CM | POA: Insufficient documentation

## 2014-09-27 DIAGNOSIS — Z951 Presence of aortocoronary bypass graft: Secondary | ICD-10-CM | POA: Diagnosis not present

## 2014-09-27 DIAGNOSIS — N183 Chronic kidney disease, stage 3 unspecified: Secondary | ICD-10-CM

## 2014-09-27 DIAGNOSIS — I270 Primary pulmonary hypertension: Secondary | ICD-10-CM

## 2014-09-27 DIAGNOSIS — E871 Hypo-osmolality and hyponatremia: Secondary | ICD-10-CM | POA: Diagnosis not present

## 2014-09-27 DIAGNOSIS — E780 Pure hypercholesterolemia: Secondary | ICD-10-CM | POA: Insufficient documentation

## 2014-09-27 DIAGNOSIS — I480 Paroxysmal atrial fibrillation: Secondary | ICD-10-CM | POA: Diagnosis not present

## 2014-09-27 DIAGNOSIS — Z95810 Presence of automatic (implantable) cardiac defibrillator: Secondary | ICD-10-CM | POA: Diagnosis not present

## 2014-09-27 DIAGNOSIS — J449 Chronic obstructive pulmonary disease, unspecified: Secondary | ICD-10-CM | POA: Diagnosis not present

## 2014-09-27 DIAGNOSIS — I2510 Atherosclerotic heart disease of native coronary artery without angina pectoris: Secondary | ICD-10-CM | POA: Diagnosis not present

## 2014-09-27 DIAGNOSIS — I340 Nonrheumatic mitral (valve) insufficiency: Secondary | ICD-10-CM | POA: Insufficient documentation

## 2014-09-27 DIAGNOSIS — Z79899 Other long term (current) drug therapy: Secondary | ICD-10-CM | POA: Diagnosis not present

## 2014-09-27 DIAGNOSIS — I4891 Unspecified atrial fibrillation: Secondary | ICD-10-CM | POA: Insufficient documentation

## 2014-09-27 DIAGNOSIS — I4892 Unspecified atrial flutter: Secondary | ICD-10-CM | POA: Insufficient documentation

## 2014-09-27 DIAGNOSIS — I129 Hypertensive chronic kidney disease with stage 1 through stage 4 chronic kidney disease, or unspecified chronic kidney disease: Secondary | ICD-10-CM | POA: Insufficient documentation

## 2014-09-27 DIAGNOSIS — I5022 Chronic systolic (congestive) heart failure: Secondary | ICD-10-CM | POA: Insufficient documentation

## 2014-09-27 DIAGNOSIS — I739 Peripheral vascular disease, unspecified: Secondary | ICD-10-CM | POA: Diagnosis not present

## 2014-09-27 DIAGNOSIS — I255 Ischemic cardiomyopathy: Secondary | ICD-10-CM | POA: Insufficient documentation

## 2014-09-27 DIAGNOSIS — I272 Pulmonary hypertension, unspecified: Secondary | ICD-10-CM

## 2014-09-27 LAB — BASIC METABOLIC PANEL
Anion gap: 10 (ref 5–15)
BUN: 37 mg/dL — ABNORMAL HIGH (ref 6–20)
CO2: 29 mmol/L (ref 22–32)
Calcium: 8.4 mg/dL — ABNORMAL LOW (ref 8.9–10.3)
Chloride: 85 mmol/L — ABNORMAL LOW (ref 101–111)
Creatinine, Ser: 1.88 mg/dL — ABNORMAL HIGH (ref 0.61–1.24)
GFR calc Af Amer: 38 mL/min — ABNORMAL LOW (ref >60)
GFR calc non Af Amer: 33 mL/min — ABNORMAL LOW (ref >60)
Glucose, Bld: 110 mg/dL — ABNORMAL HIGH (ref 70–99)
Potassium: 3.6 mmol/L (ref 3.5–5.1)
SODIUM: 124 mmol/L — AB (ref 135–145)

## 2014-09-27 LAB — CBC
HCT: 29.9 % — ABNORMAL LOW (ref 39.0–52.0)
Hemoglobin: 10.1 g/dL — ABNORMAL LOW (ref 13.0–17.0)
MCH: 29.7 pg (ref 26.0–34.0)
MCHC: 33.8 g/dL (ref 30.0–36.0)
MCV: 87.9 fL (ref 78.0–100.0)
PLATELETS: 185 10*3/uL (ref 150–400)
RBC: 3.4 MIL/uL — AB (ref 4.22–5.81)
RDW: 20.1 % — AB (ref 11.5–15.5)
WBC: 10.9 10*3/uL — AB (ref 4.0–10.5)

## 2014-09-27 MED ORDER — POTASSIUM CHLORIDE CRYS ER 20 MEQ PO TBCR: 20.0000 meq | EXTENDED_RELEASE_TABLET | Freq: Two times a day (BID) | ORAL | Status: DC

## 2014-09-27 MED ORDER — TORSEMIDE 20 MG PO TABS: 60.0000 mg | ORAL_TABLET | Freq: Every day | ORAL | Status: DC

## 2014-09-27 MED ORDER — METOLAZONE 2.5 MG PO TABS: ORAL_TABLET | ORAL | Status: DC

## 2014-09-27 NOTE — Progress Notes (Signed)
Patient ID: [name], male   DOB: 04-Jan-1936, 79 y.o.   MRN: [id number]  PCP: Dr Ernestine Conrad GI: Dr Earlean Shawl.    HPI: [name] is a 79 yo male with a history of HTN, CAD s/p CABG, ICM s/p Medtronic ICD, chronic systolic heart failure, DM2, Vtach, atrial flutter, PVD and GI mass. ECHO (12/22/13) EF 20-25%, Echo  EF 40% (12/15)  Mod-severe MR.   On September 1st he was admitted from Dr. Rosezella [place] office with volume overload and low output heart failure. Creatinine was 2.8 on admit (baseline 1.6). Central line was placed to monitor CVP and CO-OX. Initial CO-OX was 49% and he was started on Milrinone and IV lasix. Discharge weight was 151 pounds. Creatinine was 1.42.   Saw Jeff Medoff  for possible colon mass/thickening and felt to be result of previous diverticulitis.   Was admitted 2x in April. In early April had a/c CHF with cardiorenal syndrome. Treated with milrinone and improved. Also had GI bleed at that time and EGD with candidal esophagitis. In late April admitted with PNA. Treated with abx. D/c weight was 147  Follow-up: Returns with his wife for f/u. Last visit diuretics restarted. Yesterday had 10 pound weight gain (up to 159 pounds)and he was instructed to take an extra 60 mg torsemide. Says he feels better complains of fatigue.  SOB of breath with exertion. Denies PND/Orthopnea/CP. No melena. Using 2 liters [place] oxygen at night. Taking all medications.   Optivol- fluid well below threshold. Activity ~3 hours per day. No VT/AF  Echo 12/15 EF  ~40% inferior AK moderate to severe MR. RV mildly HK Echo 4/16  EF 40-45% moderate to severe MR RV moderate dysfunction RVSP 78  Labs 02/09/14 K 3.9 Creatinine 1.38 Pro BNP 2555  Labs 02/20/14 K 3.5 NA 127 Creatinine 1.6  03/01/14 TSH 19.8  T4  1.03 T3 84  Labs 03/03/14 CEA 9.0  K 4.3 creatinine 1.2 NA 134 Hgb 12.7 TSH 19.8 T3 84.9 T4 1.03  Labs 09/20/2014: K 3.3 Creatinine 2.05  Labs 09/27/2014: Na 124 K 3.6 Creatinine 1.88 CT Virtual  Colonoscopy- Diverticulosis ? Rectosigmoid Colon Ca  ROS: All systems negative except as listed in HPI, PMH and Problem List.  Past Medical History  Diagnosis Date  . VENTRICULAR TACHYCARDIA   . PERIPHERAL VASCULAR DISEASE   . HYPERTENSION     Hypertensive retinopathy-grade II OU  . HYPERCHOLESTEROLEMIA   . CORONARY ARTERY DISEASE     Hx of CABG  . Chronic systolic heart failure     Ischemic cardiomyopathy (01/2014 EF 40-45% with hypokinesis + small area of akinesis  . SEBORRHEIC DERMATITIS   . Chronic renal insufficiency, stage III (moderate)     CrCl 40s-50s  . DIVERTICULOSIS OF COLON   . COPD, severe   . COLONIC POLYPS   . ANXIETY   . ICD (implantable cardiac defibrillator) in place   . Type II diabetes mellitus     +background diabetic retinopathy OU  . Chronic lower back pain   . DJD (degenerative joint disease)     "hands" (05/24/2012)  . Osteoarthritis of finger   . History of atrial flutter   . Thrombus 12/2013    on ICD lead--started anticoag and his cardioversion for atrial flutter was postponed.  . Colon stricture 2015    with features worrisome for mass, also with recent rising CEA level (possible colon cancer)-GI MD= Dr. Franki Cabot recent eval/follow up with Dr. Earlean Shawl was summer 2014, at which time he recommended  colonoscopy but pt declined.  . Macular degeneration, age related, nonexudative     OU    Current Outpatient Prescriptions  Medication Sig Dispense Refill  . amiodarone (PACERONE) 200 MG tablet Take 1 tablet (200 mg total) by mouth daily. 30 tablet 0  . apixaban (ELIQUIS) 5 MG TABS tablet Take 1 tablet (5 mg total) by mouth 2 (two) times daily. 60 tablet 0  . carvedilol (COREG) 3.125 MG tablet Take 1 tablet (3.125 mg total) by mouth 2 (two) times daily with a meal. 60 tablet 0  . Fluticasone-Salmeterol (ADVAIR) 500-50 MCG/DOSE AEPB Inhale 1 puff into the lungs 2 (two) times daily. 60 each 11  . folic acid (FOLVITE) 1 MG tablet Take 1 tablet (1 mg  total) by mouth daily. 30 tablet 0  . hydrALAZINE (APRESOLINE) 25 MG tablet Take 25 mg by mouth 3 (three) times daily.    . Insulin Glargine (LANTUS SOLOSTAR) 100 UNIT/ML Solostar Pen 10 units SQ qhs (Patient taking differently: Inject 10 Units into the skin at bedtime. ) 15 mL 11  . isosorbide mononitrate (IMDUR) 30 MG 24 hr tablet Take 30 mg by mouth daily.    . isosorbide mononitrate (IMDUR) 30 MG 24 hr tablet TAKE ONE TABLET BY MOUTH DAILY 30 tablet 0  . levofloxacin (LEVAQUIN) 750 MG tablet Take 1 tablet (750 mg total) by mouth every other day. 3 tablet 0  . levothyroxine (SYNTHROID, LEVOTHROID) 75 MCG tablet Take 1 tablet (75 mcg total) by mouth daily. 30 tablet 3  . metolazone (ZAROXOLYN) 2.5 MG tablet Take 2.5 mg by mouth once a week. Take on mondays    . pantoprazole (PROTONIX) 40 MG tablet Take 1 tablet (40 mg total) by mouth 2 (two) times daily. 60 tablet 6  . potassium chloride (K-DUR) 10 MEQ tablet Take 20 mEq by mouth daily.    Marland Kitchen SPIRIVA HANDIHALER 18 MCG inhalation capsule PLACE ONE CAPSULE INTO INHALER AND  INHALE  DAILY 30 capsule 0  . thiamine 100 MG tablet Take 1 tablet (100 mg total) by mouth daily. 30 tablet 0  . tiotropium (SPIRIVA) 18 MCG inhalation capsule Place 18 mcg into inhaler and inhale every evening.     . tobramycin (TOBREX) 0.3 % ophthalmic solution Place 2 drops into both eyes every 6 (six) hours. 5 mL 0  . torsemide (DEMADEX) 20 MG tablet Take 2 tablets (40 mg total) by mouth daily. 30 tablet 0   No current facility-administered medications for this encounter.     PHYSICAL EXAM: Filed Vitals:   09/27/14 1404  BP: 102/48  Pulse: 63  Weight: 151 lb 12.8 oz (68.856 kg)  SpO2: 95%    General:  Frail appearing No resp difficulty. Wife present  HEENT: normal Neck: supple. JVP 10. No bruits. No lymphadenopathy or thryomegaly appreciated. Cor: PMI normal. Regular rate & rhythm. No rubs, gallops or murmurs. Lungs: clear Abdomen: soft, nontender, +distended.  No hepatosplenomegaly. No bruits or masses. Good bowel sounds. Extremities: no cyanosis, clubbing, rash, R and LLE 2-3+ edema Neuro: alert & orientedx3, cranial nerves grossly intact. Moves all 4 extremities w/o difficulty. Affect pleasant.   ASSESSMENT & PLAN: Mr Garrel Ridgel is a 79 year old with ischemic cardiomyopathy and chronic systolic heart failure recently admitted with cardiogenic shock that required short term milrinone. 1. Chronic Systolic Heart Failure with R> L symptoms.  Ischemic cardiomyopathy. Has Medtronic ICD. ECHO 4/16 EF 40-45%. NYHA III-IIIB -Volume status elevated. Increase torsemide to 60 mg daily and increase metolazone to 2.5  mg every Monday and Friday.  - Continue carvedilol to 3.125 bid - Continue Hydralazine 25 mg tid.  - He is not Ace or spiro in past due to CKD.   Discussed cardiomems device. Will submit for prior auth.  2. A fib/Aflutter: Maintaining SR.   - Continue amiodarone 200 mg daily. Recent LFTs ok. Will need yearly eye exams.  - Continue Eliquis 5 mg bid.  3. Mitral regurgitation - moderate to severe due to restriction of posterior MV leaflet. We discussed the possibility of TEE and possible repair. However, I think a major surgery like that could potentially be a major setback for him especially with his COPD. We will follow closely with surveillance echos and if LV dilating then will plan TEE to further evaluate.  4. CKD: Stable III. Check labs today.  5. DM: Per PCP.  6. CAD: s/p CABG.  Continue pravastatin.  Not on ASA with apixaban use.  7. COPD 8. H/o VT: He has an ICD and is now on amiodarone.  Stable 9. Hyponatremia  BMET today K 3.6 Creatinine 1.88  CLEGG,AMY  NP-C 2:21 PM  Patient seen and examined with Darrick Grinder, NP. We discussed all aspects of the encounter. I agree with the assessment and plan as stated above.   Continue to struggle with HF and volume overload. Volume status improved today but still not back to baseline. Seems to  respond best to higher doses of torsemide (rather than divided doses). Will increase torsemide to 60 mg daily and metolazone to 2x/week. Follow renal function closely. Long discussion about cardiomems implant and he would like to proceed. We will schedule. Labs and ICD interrogation reviewed.  Total time spent 45 minutes. Over half that time spent discussing above.   Bensimhon, Daniel,MD 5:40 PM

## 2014-09-27 NOTE — Patient Instructions (Signed)
Please increase Potassium to 20 meq twice daily Please increase Demadex to 60 mg daily Please take Metolazone 2.5mg  Monday and Friday You have been referred for a Cardiomems device--we will contact you to arrange Follow-up in 2 weeks

## 2014-09-27 NOTE — Addendum Note (Signed)
Encounter addended by: Jolaine Artist, MD on: 09/27/2014  6:03 PM<BR>     Documentation filed: Charges VN

## 2014-09-28 ENCOUNTER — Telehealth (HOSPITAL_COMMUNITY): Payer: Self-pay | Admitting: *Deleted

## 2014-09-28 DIAGNOSIS — I130 Hypertensive heart and chronic kidney disease with heart failure and stage 1 through stage 4 chronic kidney disease, or unspecified chronic kidney disease: Secondary | ICD-10-CM | POA: Diagnosis not present

## 2014-09-28 NOTE — Telephone Encounter (Signed)
Better today.

## 2014-09-28 NOTE — Telephone Encounter (Signed)
Pts wife reported his daily weights. mon-145 lbs tues-149 lbs Wed-152 lbs Today-146 lbs

## 2014-09-29 ENCOUNTER — Other Ambulatory Visit: Payer: Self-pay | Admitting: Family Medicine

## 2014-09-29 ENCOUNTER — Telehealth: Payer: Self-pay | Admitting: *Deleted

## 2014-09-29 NOTE — Telephone Encounter (Signed)
Pls complete prior auth.

## 2014-09-29 NOTE — Telephone Encounter (Signed)
Fax from [employer]. LOV 05/11/14, not on medication list or history, no upcoming apt. Please advise.

## 2014-10-02 ENCOUNTER — Telehealth: Payer: Self-pay | Admitting: *Deleted

## 2014-10-02 MED ORDER — INSULIN LISPRO 100 UNIT/ML (KWIKPEN): PEN_INJECTOR | SUBCUTANEOUS | Status: DC

## 2014-10-02 NOTE — Telephone Encounter (Signed)
While working on PA for [employer], I was notified of preferred medication Humalog Kwikpen. Per Dr. Anitra Lauth okay to call into pharmacy same directions as Novolog Flexpen. Rx sent to [facility] for Humalog KwikPen 100 units take 7 units in the morning, 8 units at noon, and 9 units at night #5pens with 3 RF. Pt advised and voiced understanding. Please review for any changes. Thanks.

## 2014-10-02 NOTE — Telephone Encounter (Signed)
Done

## 2014-10-05 ENCOUNTER — Telehealth: Payer: Self-pay | Admitting: *Deleted

## 2014-10-05 ENCOUNTER — Encounter: Payer: Self-pay | Admitting: Cardiology

## 2014-10-05 NOTE — Telephone Encounter (Signed)
[name] wife called stating he had fallen adn hit his head on the stone floor.  She states he also fell a few days ago and everytime she would try to get him up he would fall again and he fell like 3 times.  She states he is not getting dizzy, his legs just get very weak and give out on him, even while using the walker, she states he is responding to her and does not seem to be injured but she can not get him up.  Advised to hang up with me and call 911 so they can help get him up and he may need a ct since he hit his head, she is agreeable

## 2014-10-09 ENCOUNTER — Encounter (HOSPITAL_COMMUNITY): Payer: Self-pay

## 2014-10-09 ENCOUNTER — Telehealth (HOSPITAL_COMMUNITY): Payer: Self-pay

## 2014-10-09 ENCOUNTER — Telehealth: Payer: Self-pay | Admitting: Internal Medicine

## 2014-10-09 ENCOUNTER — Inpatient Hospital Stay (HOSPITAL_COMMUNITY)
Admission: EM | Admit: 2014-10-09 | Discharge: 2014-10-13 | DRG: 377 | Disposition: A | Discharge status: Rehabilitation Facility | Payer: Medicare Other | Attending: Internal Medicine | Admitting: Internal Medicine

## 2014-10-09 ENCOUNTER — Emergency Department (HOSPITAL_COMMUNITY): Payer: Medicare Other

## 2014-10-09 ENCOUNTER — Telehealth (HOSPITAL_COMMUNITY): Payer: Self-pay | Admitting: Vascular Surgery

## 2014-10-09 DIAGNOSIS — Z951 Presence of aortocoronary bypass graft: Secondary | ICD-10-CM

## 2014-10-09 DIAGNOSIS — Z9981 Dependence on supplemental oxygen: Secondary | ICD-10-CM

## 2014-10-09 DIAGNOSIS — Z888 Allergy status to other drugs, medicaments and biological substances status: Secondary | ICD-10-CM

## 2014-10-09 DIAGNOSIS — N183 Chronic kidney disease, stage 3 unspecified: Secondary | ICD-10-CM | POA: Diagnosis present

## 2014-10-09 DIAGNOSIS — D649 Anemia, unspecified: Secondary | ICD-10-CM | POA: Diagnosis present

## 2014-10-09 DIAGNOSIS — Z79899 Other long term (current) drug therapy: Secondary | ICD-10-CM

## 2014-10-09 DIAGNOSIS — M549 Dorsalgia, unspecified: Secondary | ICD-10-CM | POA: Diagnosis present

## 2014-10-09 DIAGNOSIS — E1151 Type 2 diabetes mellitus with diabetic peripheral angiopathy without gangrene: Secondary | ICD-10-CM | POA: Diagnosis present

## 2014-10-09 DIAGNOSIS — I272 Other secondary pulmonary hypertension: Secondary | ICD-10-CM | POA: Diagnosis present

## 2014-10-09 DIAGNOSIS — D696 Thrombocytopenia, unspecified: Secondary | ICD-10-CM | POA: Diagnosis not present

## 2014-10-09 DIAGNOSIS — I10 Essential (primary) hypertension: Secondary | ICD-10-CM | POA: Diagnosis present

## 2014-10-09 DIAGNOSIS — M19049 Primary osteoarthritis, unspecified hand: Secondary | ICD-10-CM | POA: Diagnosis present

## 2014-10-09 DIAGNOSIS — E46 Unspecified protein-calorie malnutrition: Secondary | ICD-10-CM | POA: Diagnosis present

## 2014-10-09 DIAGNOSIS — K922 Gastrointestinal hemorrhage, unspecified: Secondary | ICD-10-CM | POA: Diagnosis not present

## 2014-10-09 DIAGNOSIS — I472 Ventricular tachycardia: Secondary | ICD-10-CM | POA: Diagnosis present

## 2014-10-09 DIAGNOSIS — Y95 Nosocomial condition: Secondary | ICD-10-CM | POA: Diagnosis present

## 2014-10-09 DIAGNOSIS — E871 Hypo-osmolality and hyponatremia: Secondary | ICD-10-CM | POA: Diagnosis present

## 2014-10-09 DIAGNOSIS — R195 Other fecal abnormalities: Secondary | ICD-10-CM | POA: Insufficient documentation

## 2014-10-09 DIAGNOSIS — D500 Iron deficiency anemia secondary to blood loss (chronic): Secondary | ICD-10-CM | POA: Insufficient documentation

## 2014-10-09 DIAGNOSIS — I482 Chronic atrial fibrillation, unspecified: Secondary | ICD-10-CM | POA: Insufficient documentation

## 2014-10-09 DIAGNOSIS — E876 Hypokalemia: Secondary | ICD-10-CM | POA: Diagnosis not present

## 2014-10-09 DIAGNOSIS — R531 Weakness: Secondary | ICD-10-CM | POA: Insufficient documentation

## 2014-10-09 DIAGNOSIS — I255 Ischemic cardiomyopathy: Secondary | ICD-10-CM | POA: Diagnosis present

## 2014-10-09 DIAGNOSIS — Z6822 Body mass index (BMI) 22.0-22.9, adult: Secondary | ICD-10-CM

## 2014-10-09 DIAGNOSIS — Z86010 Personal history of colonic polyps: Secondary | ICD-10-CM

## 2014-10-09 DIAGNOSIS — E119 Type 2 diabetes mellitus without complications: Secondary | ICD-10-CM

## 2014-10-09 DIAGNOSIS — Z794 Long term (current) use of insulin: Secondary | ICD-10-CM

## 2014-10-09 DIAGNOSIS — I2510 Atherosclerotic heart disease of native coronary artery without angina pectoris: Secondary | ICD-10-CM | POA: Diagnosis present

## 2014-10-09 DIAGNOSIS — R296 Repeated falls: Secondary | ICD-10-CM | POA: Diagnosis present

## 2014-10-09 DIAGNOSIS — G8929 Other chronic pain: Secondary | ICD-10-CM | POA: Diagnosis present

## 2014-10-09 DIAGNOSIS — I5022 Chronic systolic (congestive) heart failure: Secondary | ICD-10-CM | POA: Diagnosis present

## 2014-10-09 DIAGNOSIS — I340 Nonrheumatic mitral (valve) insufficiency: Secondary | ICD-10-CM | POA: Diagnosis present

## 2014-10-09 DIAGNOSIS — K5669 Other intestinal obstruction: Secondary | ICD-10-CM | POA: Diagnosis present

## 2014-10-09 DIAGNOSIS — I480 Paroxysmal atrial fibrillation: Secondary | ICD-10-CM | POA: Diagnosis present

## 2014-10-09 DIAGNOSIS — I129 Hypertensive chronic kidney disease with stage 1 through stage 4 chronic kidney disease, or unspecified chronic kidney disease: Secondary | ICD-10-CM | POA: Diagnosis present

## 2014-10-09 DIAGNOSIS — E1122 Type 2 diabetes mellitus with diabetic chronic kidney disease: Secondary | ICD-10-CM | POA: Diagnosis present

## 2014-10-09 DIAGNOSIS — J9611 Chronic respiratory failure with hypoxia: Secondary | ICD-10-CM | POA: Insufficient documentation

## 2014-10-09 DIAGNOSIS — E872 Acidosis: Secondary | ICD-10-CM | POA: Diagnosis present

## 2014-10-09 DIAGNOSIS — J189 Pneumonia, unspecified organism: Secondary | ICD-10-CM | POA: Diagnosis present

## 2014-10-09 DIAGNOSIS — Z86718 Personal history of other venous thrombosis and embolism: Secondary | ICD-10-CM

## 2014-10-09 DIAGNOSIS — Z95810 Presence of automatic (implantable) cardiac defibrillator: Secondary | ICD-10-CM

## 2014-10-09 DIAGNOSIS — E039 Hypothyroidism, unspecified: Secondary | ICD-10-CM | POA: Diagnosis present

## 2014-10-09 DIAGNOSIS — Z7901 Long term (current) use of anticoagulants: Secondary | ICD-10-CM | POA: Insufficient documentation

## 2014-10-09 DIAGNOSIS — F419 Anxiety disorder, unspecified: Secondary | ICD-10-CM | POA: Diagnosis present

## 2014-10-09 DIAGNOSIS — Z87891 Personal history of nicotine dependence: Secondary | ICD-10-CM

## 2014-10-09 DIAGNOSIS — H3531 Nonexudative age-related macular degeneration: Secondary | ICD-10-CM | POA: Diagnosis present

## 2014-10-09 DIAGNOSIS — J449 Chronic obstructive pulmonary disease, unspecified: Secondary | ICD-10-CM | POA: Diagnosis present

## 2014-10-09 LAB — I-STAT TROPONIN, ED: TROPONIN I, POC: 0.04 ng/mL (ref 0.00–0.08)

## 2014-10-09 LAB — CBC WITH DIFFERENTIAL/PLATELET
Basophils Absolute: 0 10*3/uL (ref 0.0–0.1)
Basophils Relative: 0 % (ref 0–1)
Eosinophils Absolute: 0.1 10*3/uL (ref 0.0–0.7)
Eosinophils Relative: 1 % (ref 0–5)
HCT: 20.8 % — ABNORMAL LOW (ref 39.0–52.0)
Hemoglobin: 7.2 g/dL — ABNORMAL LOW (ref 13.0–17.0)
Lymphocytes Relative: 20 % (ref 12–46)
Lymphs Abs: 1.9 10*3/uL (ref 0.7–4.0)
MCH: 30.9 pg (ref 26.0–34.0)
MCHC: 34.6 g/dL (ref 30.0–36.0)
MCV: 89.3 fL (ref 78.0–100.0)
Monocytes Absolute: 1.2 10*3/uL — ABNORMAL HIGH (ref 0.1–1.0)
Monocytes Relative: 13 % — ABNORMAL HIGH (ref 3–12)
NEUTROS ABS: 6.5 10*3/uL (ref 1.7–7.7)
NEUTROS PCT: 66 % (ref 43–77)
PLATELETS: 125 10*3/uL — AB (ref 150–400)
RBC: 2.33 MIL/uL — AB (ref 4.22–5.81)
RDW: 18.9 % — ABNORMAL HIGH (ref 11.5–15.5)
WBC: 9.8 10*3/uL (ref 4.0–10.5)

## 2014-10-09 LAB — I-STAT CG4 LACTIC ACID, ED: LACTIC ACID, VENOUS: 2.72 mmol/L — AB (ref 0.5–2.0)

## 2014-10-09 LAB — POC OCCULT BLOOD, ED: Fecal Occult Bld: POSITIVE — AB

## 2014-10-09 LAB — COMPREHENSIVE METABOLIC PANEL
ALBUMIN: 2.4 g/dL — AB (ref 3.5–5.0)
ALK PHOS: 90 U/L (ref 38–126)
ALT: 22 U/L (ref 17–63)
AST: 81 U/L — AB (ref 15–41)
Anion gap: 14 (ref 5–15)
BUN: 54 mg/dL — ABNORMAL HIGH (ref 6–20)
CO2: 33 mmol/L — ABNORMAL HIGH (ref 22–32)
Calcium: 8.3 mg/dL — ABNORMAL LOW (ref 8.9–10.3)
Chloride: 78 mmol/L — ABNORMAL LOW (ref 101–111)
Creatinine, Ser: 2.31 mg/dL — ABNORMAL HIGH (ref 0.61–1.24)
GFR calc Af Amer: 30 mL/min — ABNORMAL LOW (ref >60)
GFR calc non Af Amer: 26 mL/min — ABNORMAL LOW (ref >60)
GLUCOSE: 99 mg/dL (ref 65–99)
POTASSIUM: 2.6 mmol/L — AB (ref 3.5–5.1)
SODIUM: 125 mmol/L — AB (ref 135–145)
TOTAL PROTEIN: 5.8 g/dL — AB (ref 6.5–8.1)
Total Bilirubin: 1.2 mg/dL (ref 0.3–1.2)

## 2014-10-09 LAB — BRAIN NATRIURETIC PEPTIDE: B Natriuretic Peptide: 715.8 pg/mL — ABNORMAL HIGH (ref 0.0–100.0)

## 2014-10-09 MED ORDER — VANCOMYCIN HCL 10 G IV SOLR
1250.0000 mg | Freq: Once | INTRAVENOUS | Status: AC
  Administered 2014-10-10: 1250 mg via INTRAVENOUS
  Filled 2014-10-09: qty 1250

## 2014-10-09 MED ORDER — PIPERACILLIN-TAZOBACTAM 3.375 G IVPB 30 MIN
3.3750 g | Freq: Once | INTRAVENOUS | Status: AC
  Administered 2014-10-09: 3.375 g via INTRAVENOUS
  Filled 2014-10-09: qty 50

## 2014-10-09 MED ORDER — SODIUM CHLORIDE 0.9 % IV SOLN
Freq: Once | INTRAVENOUS | Status: AC
  Administered 2014-10-10: via INTRAVENOUS

## 2014-10-09 MED ORDER — POTASSIUM CHLORIDE 10 MEQ/100ML IV SOLN
10.0000 meq | INTRAVENOUS | Status: AC
  Administered 2014-10-09 – 2014-10-10 (×4): 10 meq via INTRAVENOUS
  Filled 2014-10-09 (×4): qty 100

## 2014-10-09 MED ORDER — SODIUM CHLORIDE 0.9 % IV BOLUS (SEPSIS)
1000.0000 mL | Freq: Once | INTRAVENOUS | Status: AC
  Administered 2014-10-09: 1000 mL via INTRAVENOUS

## 2014-10-09 NOTE — Telephone Encounter (Signed)
Please call Nurse case manager from [facility] called  this pt is a new referral she has some questions for the nurse about labs and some testing please advise.. [phone] ext [phone]

## 2014-10-09 NOTE — ED Notes (Signed)
Pt taken to radiology

## 2014-10-09 NOTE — Telephone Encounter (Signed)
Home Health Nurse called while seeing Mr. Vicie Mutters. He has had 5-6 falls in the last week or so and had one larger fall where he hit his head on the concrete. He had some sort of evaluation but he refused going to see a physician or to the ER. Tonight, the home health nurse is visiting and getting him enrolled and she along with his family say he is alert and oriented x3 but has multiple small odd things going on - he is intent on watching the television but he is holding the remote control upside down. They wanted to discuss what the MD thought. I recommended that he should have sought medical advice after fall a few days ago while on apixaban and he should go to the ER to been evaluated by a physician now given his multiple falls, odd new behavior and anticoagulation given potential for subdural hematoma for example. The home health nurse agreed with this plan and told the family. They plan to attempt to get Mr. Heckler to the ER for evaluation of falls, fall on head.   Jules Husbands, MD

## 2014-10-09 NOTE — ED Notes (Signed)
Pt here for general weakness, and frequent falls. Per wife, has increased confusion from [place], with ems pt is cao x 4. Pt reports back pain and leg weakness,

## 2014-10-09 NOTE — ED Provider Notes (Signed)
CSN: [id number]     Arrival date & time 10/09/14  2154 History   First MD Initiated Contact with Patient 10/09/14 2214     Chief Complaint  Patient presents with  . Weakness   [name] is a 79 y.o. male with a past medical history significant for COPD, coronary artery disease  status post CABG, CHF status post pacemaker/ICD placement currently on Eliquis, history of ventricular tachycardia, chronic renal insufficiency, diabetes, diverticulitis, possible GI mass, and chronic low back pain who presents with worsening fatigue and falls. The patient is accompanied by his wife and daughter who report that the patient has had worsening fatigue for the last week. The patient also has had approximately 5 or 6 falls in the last 2 weeks with one involving him striking his head on a stone floor. The patient refused a visit to the emergency department with each fall however, with his symptoms worsen, the patient agreed to evaluation today. The patient describes his falls as him standing there when he suddenly has weakness in his legs. The patient denies preceding symptoms of palpitations, tachycardia, chest pain, or shortness of breath. The patient denies losing consciousness during the falls.  The patient reports that he has not had any fevers, chills, chest pain, shortness of breath, nausea, vomiting, constipation, diarrhea, rectal bleeding, dysuria, hematuria, or any other symptoms. The patient reports that he was recently admitted for pneumonia. The patient's family reports that the patient has had a slightly decreased by mouth intake over the last week.   (Consider location/radiation/quality/duration/timing/severity/associated sxs/prior Treatment) Patient is a 79 y.o. male presenting with weakness. The history is provided by the patient, the spouse and a relative. No language interpreter was used.  Weakness This is a new problem. The current episode started 1 to 4 weeks ago. The problem occurs  constantly. The problem has been unchanged. Associated symptoms include fatigue, a rash (on arms, healing per pt report) and weakness (in legs). Pertinent negatives include no abdominal pain, chest pain, chills, congestion, coughing, diaphoresis, fever, headaches, nausea, neck pain, numbness, urinary symptoms or vomiting. Nothing aggravates the symptoms. He has tried nothing for the symptoms. The treatment provided no relief.    Past Medical History  Diagnosis Date  . VENTRICULAR TACHYCARDIA   . PERIPHERAL VASCULAR DISEASE   . HYPERTENSION     Hypertensive retinopathy-grade II OU  . HYPERCHOLESTEROLEMIA   . CORONARY ARTERY DISEASE     Hx of CABG  . Chronic systolic heart failure     Ischemic cardiomyopathy (01/2014 EF 40-45% with hypokinesis + small area of akinesis  . SEBORRHEIC DERMATITIS   . Chronic renal insufficiency, stage III (moderate)     CrCl 40s-50s  . DIVERTICULOSIS OF COLON   . COPD, severe   . COLONIC POLYPS   . ANXIETY   . ICD (implantable cardiac defibrillator) in place   . Type II diabetes mellitus     +background diabetic retinopathy OU  . Chronic lower back pain   . DJD (degenerative joint disease)     "hands" (05/24/2012)  . Osteoarthritis of finger   . History of atrial flutter   . Thrombus 12/2013    on ICD lead--started anticoag and his cardioversion for atrial flutter was postponed.  . Colon stricture 2015    with features worrisome for mass, also with recent rising CEA level (possible colon cancer)-GI MD= Dr. Franki Cabot recent eval/follow up with Dr. Earlean Shawl was summer 2014, at which time he recommended colonoscopy but pt declined.  Marland Kitchen  Macular degeneration, age related, nonexudative     OU   Past Surgical History  Procedure Laterality Date  . Coronary artery bypass graft  1990's    CABG X4  . Tonsillectomy  ?1942  . Cardiac defibrillator placement  12/2004    single chamber defibrillator- Med[employer]onic Maxima 8/06 DrKlein [Other][  . Tee without  cardioversion N/A 12/22/2013    Procedure: TRANSESOPHAGEAL ECHOCARDIOGRAM (TEE);  Surgeon: Thayer Headings, MD;  Location: [place];  Service: Cardiovascular;  Laterality: N/A;  . Cardioversion N/A 12/22/2013    Procedure: CARDIOVERSION;  Surgeon: Thayer Headings, MD;  Location: [facility] ENDOSCOPY;  Service: Cardiovascular;  Laterality: N/A;  . Cardiovascular stress test  01/12/13    myocard perf imaging: previous infarct with a moderately reduced EF as was known previously.  No new findings.   . Gastric emptying scan  02/02/14    Normal  . Transthoracic echocardiogram  01/25/14; 04/2014    EF 40-45%, diffuse hypokinesis, infero-basilar myocardial akinesis, PA pressure slightly high, valves fine.04/2014-Echo today EF ~40% inferior AK moderate to severe MR  . Abdominal ultrasound  01/2014    GB sludge, o/w normal  . Ct virtual colonoscopy diagnostic  11/2012    Asymmetric thickening of the rectosigmoid colon worrisome for  . Cardiovascular stress test      Lexiscan: There is a medium sized fixed inferior wall defect of moderate severity involving the apical inferior and mid inferior and basal inferoseptal segments, consistent with old inferior MI. No significant reversible ischemia.  EF 34%, inferior wall hypokinesis--intermediate risk scan (ok to do planned procedure)  . Implantable cardioverter defibrillator revision N/A 05/25/2012    Procedure: IMPLANTABLE CARDIOVERTER DEFIBRILLATOR REVISION;  Surgeon: Evans Lance, MD;  Location: [facility] CATH LAB;  Service: Cardiovascular;  Laterality: N/A;  . Right heart catheterization N/A 08/31/2014    Procedure: RIGHT HEART CATH;  Surgeon: Larey Dresser, MD;  Location: [facility] CATH LAB;  Service: Cardiovascular;  Laterality: N/A;  . Esophagogastroduodenoscopy N/A 09/03/2014    Procedure: ESOPHAGOGASTRODUODENOSCOPY (EGD);  Surgeon: Carol Ada, MD;  Location: [facility] ENDOSCOPY;  Service: Endoscopy;  Laterality: N/A;   Family History  Problem Relation Age of Onset  . Heart  attack Father 78  . Heart attack Brother     Vague history   History  Substance Use Topics  . Smoking status: Former Smoker -- 2.00 packs/day for 50 years    Types: Cigarettes    Quit date: 06/24/2000  . Smokeless tobacco: Never Used  . Alcohol Use: 0.0 oz/week    0 Standard drinks or equivalent per week     Comment: 05/24/2012 "used to drink alot; quit > 10 yr ago"    Review of Systems  Constitutional: Positive for fatigue. Negative for fever, chills, diaphoresis and appetite change.  HENT: Negative for congestion and rhinorrhea.   Respiratory: Negative for cough, choking, chest tightness, shortness of breath, wheezing and stridor.   Cardiovascular: Negative for chest pain.  Gastrointestinal: Negative for nausea, vomiting, abdominal pain, diarrhea and constipation.  Genitourinary: Negative for dysuria, frequency and flank pain.  Musculoskeletal: Positive for back pain (left sided). Negative for neck pain and neck stiffness.  Skin: Positive for rash (on arms, healing per pt report). Negative for wound.  Neurological: Positive for weakness (in legs). Negative for dizziness, numbness and headaches.  Psychiatric/Behavioral: Negative for confusion and agitation.  All other systems reviewed and are negative.     Allergies  Niacin  Home Medications   Prior to Admission medications  Medication Sig Start Date End Date Taking? Authorizing Provider  amiodarone (PACERONE) 200 MG tablet Take 1 tablet (200 mg total) by mouth daily. 09/14/14   Allie Bossier, MD  apixaban (ELIQUIS) 5 MG TABS tablet Take 1 tablet (5 mg total) by mouth 2 (two) times daily. 09/14/14   Allie Bossier, MD  carvedilol (COREG) 3.125 MG tablet Take 1 tablet (3.125 mg total) by mouth 2 (two) times daily with a meal. 09/14/14   Allie Bossier, MD  Fluticasone-Salmeterol (ADVAIR) 500-50 MCG/DOSE AEPB Inhale 1 puff into the lungs 2 (two) times daily. 04/05/14   Noralee Space, MD  folic acid (FOLVITE) 1 MG tablet Take 1  tablet (1 mg total) by mouth daily. 09/14/14   Allie Bossier, MD  hydrALAZINE (APRESOLINE) 25 MG tablet Take 25 mg by mouth 3 (three) times daily.    Historical Provider, MD  Insulin Glargine (LANTUS SOLOSTAR) 100 UNIT/ML Solostar Pen 10 units SQ qhs Patient taking differently: Inject 10 Units into the skin at bedtime.  02/10/14   Tammi Sou, MD  insulin lispro (HUMALOG KWIKPEN) 100 UNIT/ML KiwkPen Take 7 units in the morning, 8 units at noon, and 9 units at bedtime. 10/02/14   Tammi Sou, MD  isosorbide mononitrate (IMDUR) 30 MG 24 hr tablet Take 30 mg by mouth daily.    Historical Provider, MD  isosorbide mononitrate (IMDUR) 30 MG 24 hr tablet TAKE ONE TABLET BY MOUTH DAILY 09/26/14   Amy D Clegg, NP  levofloxacin (LEVAQUIN) 750 MG tablet Take 1 tablet (750 mg total) by mouth every other day. 09/14/14   Allie Bossier, MD  levothyroxine (SYNTHROID, LEVOTHROID) 75 MCG tablet Take 1 tablet (75 mcg total) by mouth daily. 09/18/14   Tammi Sou, MD  metolazone (ZAROXOLYN) 2.5 MG tablet Take on mondays and Fridays 09/27/14   Jolaine Artist, MD  pantoprazole (PROTONIX) 40 MG tablet Take 1 tablet (40 mg total) by mouth 2 (two) times daily. 09/05/14   Amy D Clegg, NP  potassium chloride SA (KLOR-CON M20) 20 MEQ tablet Take 1 tablet (20 mEq total) by mouth 2 (two) times daily. 09/27/14   Jolaine Artist, MD  SPIRIVA HANDIHALER 18 MCG inhalation capsule PLACE ONE CAPSULE INTO INHALER AND  INHALE  DAILY 09/18/14   Noralee Space, MD  thiamine 100 MG tablet Take 1 tablet (100 mg total) by mouth daily. 09/14/14   Allie Bossier, MD  tiotropium (SPIRIVA) 18 MCG inhalation capsule Place 18 mcg into inhaler and inhale every evening.     Historical Provider, MD  tobramycin (TOBREX) 0.3 % ophthalmic solution Place 2 drops into both eyes every 6 (six) hours. 09/14/14   Allie Bossier, MD  torsemide (DEMADEX) 20 MG tablet Take 3 tablets (60 mg total) by mouth daily. 09/27/14   Shaune Pascal Bensimhon, MD   BP  119/44 mmHg  Pulse 56  Temp(Src) 97.6 F (36.4 C) (Oral)  Resp 23  Ht 5\' 7"  (1.702 m)  Wt 145 lb (65.772 kg)  BMI 22.71 kg/m2  SpO2 100% Physical Exam  Constitutional: He is oriented to person, place, and time. He appears well-developed and well-nourished. He appears ill. No distress.  HENT:  Head: Normocephalic.  Mouth/Throat: No oropharyngeal exudate.  Eyes: Conjunctivae and EOM are normal. Pupils are equal, round, and reactive to light.  Neck: Normal range of motion.  Cardiovascular: Normal rate, regular rhythm, normal heart sounds and intact distal pulses.   No murmur heard. Pulmonary/Chest:  Effort normal and breath sounds normal. No stridor. No respiratory distress. He has no wheezes. He exhibits no tenderness.  Abdominal: Soft. Bowel sounds are normal. He exhibits no distension. There is no tenderness. There is no rebound and no guarding.  Genitourinary: Rectal exam shows no external hemorrhoid. Guaiac positive stool.  Musculoskeletal: He exhibits tenderness.       Lumbar back: He exhibits tenderness and pain.       Back:  Neurological: He is alert and oriented to person, place, and time. He has normal strength. He is not disoriented. He displays normal reflexes. No cranial nerve deficit or sensory deficit. He exhibits normal muscle tone. Coordination normal. GCS eye subscore is 4. GCS verbal subscore is 5. GCS motor subscore is 6.  Skin: Skin is warm. He is not diaphoretic. There is erythema. There is pallor.  Psychiatric: He has a normal mood and affect.  Nursing note and vitals reviewed.   ED Course  Procedures (including critical care time) Labs Review Labs Reviewed  CBC WITH DIFFERENTIAL/PLATELET - Abnormal; Notable for the following:    RBC 2.33 (*)    Hemoglobin 7.2 (*)    HCT 20.8 (*)    RDW 18.9 (*)    Platelets 125 (*)    Monocytes Relative 13 (*)    Monocytes Absolute 1.2 (*)    All other components within normal limits  COMPREHENSIVE METABOLIC PANEL -  Abnormal; Notable for the following:    Sodium 125 (*)    Potassium 2.6 (*)    Chloride 78 (*)    CO2 33 (*)    BUN 54 (*)    Creatinine, Ser 2.31 (*)    Calcium 8.3 (*)    Total Protein 5.8 (*)    Albumin 2.4 (*)    AST 81 (*)    GFR calc non Af Amer 26 (*)    GFR calc Af Amer 30 (*)    All other components within normal limits  BRAIN NATRIURETIC PEPTIDE - Abnormal; Notable for the following:    B Natriuretic Peptide 715.8 (*)    All other components within normal limits  I-STAT CG4 LACTIC ACID, ED - Abnormal; Notable for the following:    Lactic Acid, Venous 2.72 (*)    All other components within normal limits  POC OCCULT BLOOD, ED - Abnormal; Notable for the following:    Fecal Occult Bld POSITIVE (*)    All other components within normal limits  URINE CULTURE  URINALYSIS, ROUTINE W REFLEX MICROSCOPIC  I-STAT TROPOININ, ED  TYPE AND SCREEN  PREPARE RBC (CROSSMATCH)    Imaging Review Dg Chest 2 View  10/09/2014   CLINICAL DATA:  Weakness, shortness of breath.  History of COPD.  EXAM: CHEST  2 VIEW  COMPARISON:  09/11/2014  FINDINGS: Lungs are hyperinflated with emphysema. There are bilateral calcified pleural plaques. Dual lead left-sided pacemaker remains in place. Patient is post median sternotomy. Stable cardiomegaly. No pulmonary edema. Improved right upper lobe opacity compared to prior. Increased opacity at the right costophrenic angle may reflect new focal consolidation, atelectasis, or less likely pleural effusion. No left pleural effusion. No pneumothorax. No acute osseous abnormalities are seen.  IMPRESSION: Increasing opacity at the right costophrenic angle that is new/ progressed from prior exams and may reflect atelectasis, pneumonia or less likely pleural effusion.  Underlying chronic lung disease, bilateral pleural plaques and emphysema. Stable cardiomegaly.   Electronically Signed   By: Jeb Levering M.D.   On: 10/09/2014 23:23  Dg Thoracic Spine  W/swimmers  10/09/2014   CLINICAL DATA:  Acute onset of chronic upper back pain. Initial encounter.  EXAM: THORACIC SPINE - 2 VIEW + SWIMMERS  COMPARISON:  Chest radiograph performed 09/11/2014  FINDINGS: There is no evidence of fracture or subluxation. Vertebral bodies demonstrate normal height and alignment. Mild degenerative change is noted along the cervical spine.  The visualized portions of both lungs are clear. The mediastinum is borderline normal in size. The patient is status post median sternotomy, with evidence of prior CABG. AICD leads are noted.  IMPRESSION: No evidence of fracture or subluxation along the cervical spine.   Electronically Signed   By: Garald Balding M.D.   On: 10/09/2014 23:22   Dg Lumbar Spine 2-3 Views  10/09/2014   CLINICAL DATA:  Chronic lower back pain.  Initial encounter.  EXAM: LUMBAR SPINE - 2-3 VIEW  COMPARISON:  CT of the abdomen and pelvis performed 12/07/2012  FINDINGS: There is no evidence of acute fracture or subluxation. Vertebral bodies demonstrate normal height. There is grade 1 anterolisthesis of L3 on L4. Mild disc space narrowing is noted at L2-L3 and L5-S1. Associated endplate sclerotic change is noted.  The visualized bowel gas pattern is unremarkable in appearance; air and stool are noted within the colon. The sacroiliac joints are within normal limits. Scattered vascular calcifications are seen.  IMPRESSION: 1. No evidence of acute fracture or subluxation along the lumbar spine. 2. Mild degenerative change noted along the lumbar spine. 3. Scattered vascular calcifications seen.   Electronically Signed   By: Garald Balding M.D.   On: 10/09/2014 23:24   Ct Head Wo Contrast  10/09/2014   CLINICAL DATA:  Acute onset of generalized weakness. Back pain. Frequent falls. Worsening confusion. Initial encounter.  EXAM: CT HEAD WITHOUT CONTRAST  TECHNIQUE: Contiguous axial images were obtained from the base of the skull through the vertex without intravenous  contrast.  COMPARISON:  CT of the neck performed 03/11/2007  FINDINGS: There is no evidence of acute infarction, mass lesion, or intra- or extra-axial hemorrhage on CT.  Prominence of the sulci suggests mild cortical volume loss. Mild cerebellar atrophy is noted. Mild periventricular white matter change likely reflects small vessel ischemic microangiopathy.  The brainstem and fourth ventricle are within normal limits. The third and lateral ventricles, and basal ganglia are unremarkable in appearance. The cerebral hemispheres are symmetric in appearance, with normal gray-white differentiation. No mass effect or midline shift is seen.  There is no evidence of fracture; visualized osseous structures are unremarkable in appearance. The orbits are within normal limits. The paranasal sinuses and mastoid air cells are well-aerated. No significant soft tissue abnormalities are seen.  IMPRESSION: 1. No acute intracranial pathology seen on CT. 2. Mild cortical volume loss and scattered small vessel ischemic microangiopathy.   Electronically Signed   By: Garald Balding M.D.   On: 10/09/2014 23:46     EKG Interpretation   Date/Time:  Monday Oct 09 2014 22:03:34 EDT Ventricular Rate:  57 PR Interval:  209 QRS Duration: 188 QT Interval:  519 QTC Calculation: 505 R Axis:   102 Text Interpretation:  Sinus rhythm Nonspecific intraventricular conduction  delay Borderline repol abnormality, diffuse leads Baseline wander in  lead(s) V1 Confirmed by Hazle Coca [phone]) on 10/09/2014 10:39:59 PM      MDM   [name] is a 79 y.o. male with a past medical history significant for COPD, coronary artery disease  status post CABG, CHF status post pacemaker/ICD  placement currently on Eliquis, history of ventricular tachycardia, chronic renal insufficiency, diabetes, diverticulitis, possible GI mass, and chronic low back pain who presents with worsening fatigue and falls. Based on the patient's history and physical  exam, there is clinical concern for anemia as a cause of the patient's worsening fatigue. The patient appeared pale on exam but did not have any focal neurological deficits. The patient did have mild tenderness on his left lower back and, in the setting of recent falls, the patient had x-rays obtained. The patient also will have a CT of the head to look for head bleed with the falls on Eliquis.  The patient's diagnostic laboratory and imaging workup revealed evidence of anemia. The patient was found to have a hemoglobin of 7.2 which is worse than prior. The patient had type and cross and transfusion orders placed. The patient was also found to be hypokalemic with a potassium 2.6. The patient had potassium replacement ordered. The patient's lactic acid was elevated at 2.72 on initial reading. The patient was started on normal saline rehydration. The patient was also found to have an increase in his creatinine from baseline. The patient's fecal occult blood test was positive although there was no frank blood on the digital rectal exam. The patient's initial troponin was 0.04.  The patient's urinalysis did not show evidence of infection.  The patient had a chest x-ray which was concerning for pneumonia. Given the patient's recent hospitalization for pneumonia, as well as his symptoms, the patient was treated for HCAP.   The patient will be admitted to the hospitalist stepdown service for further management of his anemia, GI bleed, electrolyte abnormalities, and his pneumonia.  The patient had no other problems or complications prior to admission. The patient was admitted in stable condition.  This patient was seen with Dr. Ralene Bathe, emergency medicine attending.  Final diagnoses:  Weakness        Antony Blackbird, MD 10/10/14 0206  Quintella Reichert, MD 10/10/14 [phone]

## 2014-10-09 NOTE — Telephone Encounter (Signed)
Attempted to return call, no answer.  Left VM to call us back to verify needs for patient.  [name]

## 2014-10-09 NOTE — Telephone Encounter (Signed)
Patient's wife called c/o husband falling again over the weekend.  Since patient was seen with [place] 2 weeks ago, she predicts he has fallen 7 times.  States he is not dizzy at all, but that he will be ambulating and his knees simply give out.  Per another nurse Heather's note this past Friday, they were advised to call 911 to be evaluated in the ED and possibly scanned as he hit his head when he fell Friday.  Wife states patient adamantly refused.  Spoke with patient over the phone.  He refuses to be brought to hospital by ambulance because "he is fine".  Wife states she cannot bring him to his appointment with [place] this week because he cannot ambulate to the car and she cannot handle him by herself, especially if he falls.  Wife says [facility].  Advised to call them and have the nurse come to the home today to evaluate patient and call us when nurse is there.  Wife aware and agreeable.  [name]

## 2014-10-10 ENCOUNTER — Encounter (HOSPITAL_COMMUNITY): Payer: Self-pay | Admitting: Internal Medicine

## 2014-10-10 DIAGNOSIS — Z7901 Long term (current) use of anticoagulants: Secondary | ICD-10-CM | POA: Insufficient documentation

## 2014-10-10 DIAGNOSIS — D62 Acute posthemorrhagic anemia: Secondary | ICD-10-CM | POA: Diagnosis not present

## 2014-10-10 DIAGNOSIS — K2961 Other gastritis with bleeding: Secondary | ICD-10-CM | POA: Diagnosis not present

## 2014-10-10 DIAGNOSIS — Z95810 Presence of automatic (implantable) cardiac defibrillator: Secondary | ICD-10-CM | POA: Diagnosis not present

## 2014-10-10 DIAGNOSIS — I272 Other secondary pulmonary hypertension: Secondary | ICD-10-CM | POA: Diagnosis present

## 2014-10-10 DIAGNOSIS — K921 Melena: Secondary | ICD-10-CM

## 2014-10-10 DIAGNOSIS — D500 Iron deficiency anemia secondary to blood loss (chronic): Secondary | ICD-10-CM | POA: Diagnosis not present

## 2014-10-10 DIAGNOSIS — E871 Hypo-osmolality and hyponatremia: Secondary | ICD-10-CM | POA: Diagnosis present

## 2014-10-10 DIAGNOSIS — E039 Hypothyroidism, unspecified: Secondary | ICD-10-CM | POA: Diagnosis present

## 2014-10-10 DIAGNOSIS — Z79899 Other long term (current) drug therapy: Secondary | ICD-10-CM | POA: Diagnosis not present

## 2014-10-10 DIAGNOSIS — E1165 Type 2 diabetes mellitus with hyperglycemia: Secondary | ICD-10-CM | POA: Diagnosis not present

## 2014-10-10 DIAGNOSIS — M549 Dorsalgia, unspecified: Secondary | ICD-10-CM | POA: Diagnosis present

## 2014-10-10 DIAGNOSIS — Z86718 Personal history of other venous thrombosis and embolism: Secondary | ICD-10-CM | POA: Diagnosis not present

## 2014-10-10 DIAGNOSIS — E1122 Type 2 diabetes mellitus with diabetic chronic kidney disease: Secondary | ICD-10-CM | POA: Diagnosis present

## 2014-10-10 DIAGNOSIS — H3531 Nonexudative age-related macular degeneration: Secondary | ICD-10-CM | POA: Diagnosis present

## 2014-10-10 DIAGNOSIS — K5669 Other intestinal obstruction: Secondary | ICD-10-CM

## 2014-10-10 DIAGNOSIS — E1151 Type 2 diabetes mellitus with diabetic peripheral angiopathy without gangrene: Secondary | ICD-10-CM | POA: Diagnosis present

## 2014-10-10 DIAGNOSIS — J9611 Chronic respiratory failure with hypoxia: Secondary | ICD-10-CM | POA: Diagnosis present

## 2014-10-10 DIAGNOSIS — N183 Chronic kidney disease, stage 3 (moderate): Secondary | ICD-10-CM | POA: Diagnosis not present

## 2014-10-10 DIAGNOSIS — I472 Ventricular tachycardia: Secondary | ICD-10-CM | POA: Diagnosis present

## 2014-10-10 DIAGNOSIS — I480 Paroxysmal atrial fibrillation: Secondary | ICD-10-CM

## 2014-10-10 DIAGNOSIS — K922 Gastrointestinal hemorrhage, unspecified: Secondary | ICD-10-CM | POA: Diagnosis not present

## 2014-10-10 DIAGNOSIS — R195 Other fecal abnormalities: Secondary | ICD-10-CM

## 2014-10-10 DIAGNOSIS — I482 Chronic atrial fibrillation: Secondary | ICD-10-CM | POA: Diagnosis not present

## 2014-10-10 DIAGNOSIS — Z951 Presence of aortocoronary bypass graft: Secondary | ICD-10-CM | POA: Diagnosis not present

## 2014-10-10 DIAGNOSIS — Y95 Nosocomial condition: Secondary | ICD-10-CM | POA: Diagnosis present

## 2014-10-10 DIAGNOSIS — G8929 Other chronic pain: Secondary | ICD-10-CM | POA: Diagnosis present

## 2014-10-10 DIAGNOSIS — J189 Pneumonia, unspecified organism: Secondary | ICD-10-CM | POA: Diagnosis present

## 2014-10-10 DIAGNOSIS — I129 Hypertensive chronic kidney disease with stage 1 through stage 4 chronic kidney disease, or unspecified chronic kidney disease: Secondary | ICD-10-CM | POA: Diagnosis present

## 2014-10-10 DIAGNOSIS — Z6822 Body mass index (BMI) 22.0-22.9, adult: Secondary | ICD-10-CM | POA: Diagnosis not present

## 2014-10-10 DIAGNOSIS — M19049 Primary osteoarthritis, unspecified hand: Secondary | ICD-10-CM | POA: Diagnosis present

## 2014-10-10 DIAGNOSIS — J449 Chronic obstructive pulmonary disease, unspecified: Secondary | ICD-10-CM | POA: Diagnosis present

## 2014-10-10 DIAGNOSIS — R296 Repeated falls: Secondary | ICD-10-CM | POA: Diagnosis present

## 2014-10-10 DIAGNOSIS — Z888 Allergy status to other drugs, medicaments and biological substances status: Secondary | ICD-10-CM | POA: Diagnosis not present

## 2014-10-10 DIAGNOSIS — I255 Ischemic cardiomyopathy: Secondary | ICD-10-CM | POA: Diagnosis present

## 2014-10-10 DIAGNOSIS — Z794 Long term (current) use of insulin: Secondary | ICD-10-CM | POA: Diagnosis not present

## 2014-10-10 DIAGNOSIS — R531 Weakness: Secondary | ICD-10-CM | POA: Diagnosis present

## 2014-10-10 DIAGNOSIS — E119 Type 2 diabetes mellitus without complications: Secondary | ICD-10-CM

## 2014-10-10 DIAGNOSIS — I340 Nonrheumatic mitral (valve) insufficiency: Secondary | ICD-10-CM | POA: Diagnosis present

## 2014-10-10 DIAGNOSIS — E876 Hypokalemia: Secondary | ICD-10-CM | POA: Diagnosis not present

## 2014-10-10 DIAGNOSIS — I5022 Chronic systolic (congestive) heart failure: Secondary | ICD-10-CM

## 2014-10-10 DIAGNOSIS — R5381 Other malaise: Secondary | ICD-10-CM | POA: Diagnosis not present

## 2014-10-10 DIAGNOSIS — E872 Acidosis: Secondary | ICD-10-CM | POA: Diagnosis present

## 2014-10-10 DIAGNOSIS — F419 Anxiety disorder, unspecified: Secondary | ICD-10-CM | POA: Diagnosis present

## 2014-10-10 DIAGNOSIS — Z86010 Personal history of colonic polyps: Secondary | ICD-10-CM | POA: Diagnosis not present

## 2014-10-10 DIAGNOSIS — I4891 Unspecified atrial fibrillation: Secondary | ICD-10-CM | POA: Diagnosis not present

## 2014-10-10 DIAGNOSIS — I2510 Atherosclerotic heart disease of native coronary artery without angina pectoris: Secondary | ICD-10-CM | POA: Diagnosis present

## 2014-10-10 DIAGNOSIS — E46 Unspecified protein-calorie malnutrition: Secondary | ICD-10-CM | POA: Diagnosis present

## 2014-10-10 DIAGNOSIS — Z87891 Personal history of nicotine dependence: Secondary | ICD-10-CM | POA: Diagnosis not present

## 2014-10-10 DIAGNOSIS — D649 Anemia, unspecified: Secondary | ICD-10-CM | POA: Diagnosis present

## 2014-10-10 DIAGNOSIS — D696 Thrombocytopenia, unspecified: Secondary | ICD-10-CM | POA: Diagnosis not present

## 2014-10-10 DIAGNOSIS — Z9981 Dependence on supplemental oxygen: Secondary | ICD-10-CM | POA: Diagnosis not present

## 2014-10-10 LAB — URINALYSIS, ROUTINE W REFLEX MICROSCOPIC
BILIRUBIN URINE: NEGATIVE
Glucose, UA: NEGATIVE mg/dL
HGB URINE DIPSTICK: NEGATIVE
Ketones, ur: NEGATIVE mg/dL
Leukocytes, UA: NEGATIVE
Nitrite: NEGATIVE
PH: 6.5 (ref 5.0–8.0)
PROTEIN: NEGATIVE mg/dL
Specific Gravity, Urine: 1.01 (ref 1.005–1.030)
UROBILINOGEN UA: 1 mg/dL (ref 0.0–1.0)

## 2014-10-10 LAB — COMPREHENSIVE METABOLIC PANEL
ALK PHOS: 82 U/L (ref 38–126)
ALT: 21 U/L (ref 17–63)
AST: 75 U/L — ABNORMAL HIGH (ref 15–41)
Albumin: 2.1 g/dL — ABNORMAL LOW (ref 3.5–5.0)
Anion gap: 8 (ref 5–15)
BUN: 48 mg/dL — AB (ref 6–20)
CHLORIDE: 86 mmol/L — AB (ref 101–111)
CO2: 32 mmol/L (ref 22–32)
CREATININE: 2.02 mg/dL — AB (ref 0.61–1.24)
Calcium: 7.8 mg/dL — ABNORMAL LOW (ref 8.9–10.3)
GFR calc Af Amer: 35 mL/min — ABNORMAL LOW (ref >60)
GFR, EST NON AFRICAN AMERICAN: 30 mL/min — AB (ref >60)
Glucose, Bld: 123 mg/dL — ABNORMAL HIGH (ref 65–99)
POTASSIUM: 2.9 mmol/L — AB (ref 3.5–5.1)
Sodium: 126 mmol/L — ABNORMAL LOW (ref 135–145)
Total Bilirubin: 2.6 mg/dL — ABNORMAL HIGH (ref 0.3–1.2)
Total Protein: 5 g/dL — ABNORMAL LOW (ref 6.5–8.1)

## 2014-10-10 LAB — CBC WITH DIFFERENTIAL/PLATELET
BASOS ABS: 0 10*3/uL (ref 0.0–0.1)
BASOS PCT: 0 % (ref 0–1)
EOS PCT: 2 % (ref 0–5)
Eosinophils Absolute: 0.2 10*3/uL (ref 0.0–0.7)
HCT: 23.4 % — ABNORMAL LOW (ref 39.0–52.0)
Hemoglobin: 8.1 g/dL — ABNORMAL LOW (ref 13.0–17.0)
Lymphocytes Relative: 13 % (ref 12–46)
Lymphs Abs: 1.7 10*3/uL (ref 0.7–4.0)
MCH: 30.6 pg (ref 26.0–34.0)
MCHC: 34.6 g/dL (ref 30.0–36.0)
MCV: 88.3 fL (ref 78.0–100.0)
MONOS PCT: 13 % — AB (ref 3–12)
Monocytes Absolute: 1.6 10*3/uL — ABNORMAL HIGH (ref 0.1–1.0)
NEUTROS ABS: 9.1 10*3/uL — AB (ref 1.7–7.7)
NEUTROS PCT: 72 % (ref 43–77)
Platelets: 122 10*3/uL — ABNORMAL LOW (ref 150–400)
RBC: 2.65 MIL/uL — AB (ref 4.22–5.81)
RDW: 18 % — ABNORMAL HIGH (ref 11.5–15.5)
WBC: 12.6 10*3/uL — ABNORMAL HIGH (ref 4.0–10.5)

## 2014-10-10 LAB — GLUCOSE, CAPILLARY
GLUCOSE-CAPILLARY: 205 mg/dL — AB (ref 65–99)
Glucose-Capillary: 125 mg/dL — ABNORMAL HIGH (ref 65–99)
Glucose-Capillary: 168 mg/dL — ABNORMAL HIGH (ref 65–99)

## 2014-10-10 LAB — MRSA PCR SCREENING: MRSA BY PCR: NEGATIVE

## 2014-10-10 LAB — MAGNESIUM: Magnesium: 1.5 mg/dL — ABNORMAL LOW (ref 1.7–2.4)

## 2014-10-10 LAB — PREPARE RBC (CROSSMATCH)

## 2014-10-10 LAB — POCT I-STAT TROPONIN I: TROPONIN I, POC: 0.05 ng/mL (ref 0.00–0.08)

## 2014-10-10 LAB — I-STAT CG4 LACTIC ACID, ED: LACTIC ACID, VENOUS: 2.37 mmol/L — AB (ref 0.5–2.0)

## 2014-10-10 MED ORDER — FOLIC ACID 1 MG PO TABS
1.0000 mg | ORAL_TABLET | Freq: Every day | ORAL | Status: DC
  Administered 2014-10-10 – 2014-10-13 (×4): 1 mg via ORAL
  Filled 2014-10-10 (×4): qty 1

## 2014-10-10 MED ORDER — MAGNESIUM SULFATE 2 GM/50ML IV SOLN
2.0000 g | Freq: Once | INTRAVENOUS | Status: AC
  Administered 2014-10-10: 2 g via INTRAVENOUS
  Filled 2014-10-10: qty 50

## 2014-10-10 MED ORDER — INSULIN GLARGINE 100 UNIT/ML ~~LOC~~ SOLN
10.0000 [IU] | Freq: Every day | SUBCUTANEOUS | Status: DC
  Administered 2014-10-10 – 2014-10-12 (×3): 10 [IU] via SUBCUTANEOUS
  Filled 2014-10-10 (×4): qty 0.1

## 2014-10-10 MED ORDER — VANCOMYCIN HCL IN DEXTROSE 750-5 MG/150ML-% IV SOLN
750.0000 mg | INTRAVENOUS | Status: DC
  Administered 2014-10-10 – 2014-10-12 (×3): 750 mg via INTRAVENOUS
  Filled 2014-10-10 (×4): qty 150

## 2014-10-10 MED ORDER — TIOTROPIUM BROMIDE MONOHYDRATE 18 MCG IN CAPS
18.0000 ug | ORAL_CAPSULE | Freq: Every day | RESPIRATORY_TRACT | Status: DC
  Administered 2014-10-11 – 2014-10-13 (×3): 18 ug via RESPIRATORY_TRACT
  Filled 2014-10-10 (×2): qty 5

## 2014-10-10 MED ORDER — PANTOPRAZOLE SODIUM 40 MG PO TBEC
40.0000 mg | DELAYED_RELEASE_TABLET | Freq: Every day | ORAL | Status: DC
  Administered 2014-10-11 – 2014-10-13 (×3): 40 mg via ORAL
  Filled 2014-10-10 (×3): qty 1

## 2014-10-10 MED ORDER — AMIODARONE HCL 200 MG PO TABS
200.0000 mg | ORAL_TABLET | Freq: Every day | ORAL | Status: DC
  Administered 2014-10-10 – 2014-10-13 (×4): 200 mg via ORAL
  Filled 2014-10-10 (×4): qty 1

## 2014-10-10 MED ORDER — ISOSORBIDE MONONITRATE ER 30 MG PO TB24
30.0000 mg | ORAL_TABLET | Freq: Every day | ORAL | Status: DC
  Administered 2014-10-10 – 2014-10-13 (×4): 30 mg via ORAL
  Filled 2014-10-10 (×4): qty 1

## 2014-10-10 MED ORDER — POTASSIUM CHLORIDE CRYS ER 20 MEQ PO TBCR
40.0000 meq | EXTENDED_RELEASE_TABLET | Freq: Three times a day (TID) | ORAL | Status: AC
  Administered 2014-10-10 – 2014-10-11 (×4): 40 meq via ORAL
  Filled 2014-10-10 (×4): qty 2

## 2014-10-10 MED ORDER — SODIUM CHLORIDE 0.9 % IJ SOLN
3.0000 mL | Freq: Two times a day (BID) | INTRAMUSCULAR | Status: DC
  Administered 2014-10-10 – 2014-10-13 (×6): 3 mL via INTRAVENOUS

## 2014-10-10 MED ORDER — VITAMIN B-1 100 MG PO TABS
100.0000 mg | ORAL_TABLET | Freq: Every day | ORAL | Status: DC
  Administered 2014-10-10 – 2014-10-13 (×4): 100 mg via ORAL
  Filled 2014-10-10 (×4): qty 1

## 2014-10-10 MED ORDER — LEVOTHYROXINE SODIUM 75 MCG PO TABS
75.0000 ug | ORAL_TABLET | Freq: Every day | ORAL | Status: DC
  Administered 2014-10-10 – 2014-10-13 (×4): 75 ug via ORAL
  Filled 2014-10-10 (×5): qty 1

## 2014-10-10 MED ORDER — POTASSIUM CHLORIDE CRYS ER 20 MEQ PO TBCR
20.0000 meq | EXTENDED_RELEASE_TABLET | Freq: Two times a day (BID) | ORAL | Status: DC
  Administered 2014-10-10: 20 meq via ORAL
  Filled 2014-10-10: qty 1

## 2014-10-10 MED ORDER — SODIUM CHLORIDE 0.9 % IV SOLN: INTRAVENOUS | Status: DC

## 2014-10-10 MED ORDER — HYDRALAZINE HCL 25 MG PO TABS
25.0000 mg | ORAL_TABLET | Freq: Three times a day (TID) | ORAL | Status: DC
  Administered 2014-10-11 – 2014-10-13 (×4): 25 mg via ORAL
  Filled 2014-10-10 (×12): qty 1

## 2014-10-10 MED ORDER — CARVEDILOL 3.125 MG PO TABS
3.1250 mg | ORAL_TABLET | Freq: Two times a day (BID) | ORAL | Status: DC
  Administered 2014-10-10 – 2014-10-13 (×5): 3.125 mg via ORAL
  Filled 2014-10-10 (×9): qty 1

## 2014-10-10 MED ORDER — ONDANSETRON HCL 4 MG/2ML IJ SOLN: 4.0000 mg | Freq: Four times a day (QID) | INTRAMUSCULAR | Status: DC | PRN

## 2014-10-10 MED ORDER — PANTOPRAZOLE SODIUM 40 MG IV SOLR
40.0000 mg | Freq: Two times a day (BID) | INTRAVENOUS | Status: DC
  Administered 2014-10-10 (×2): 40 mg via INTRAVENOUS
  Filled 2014-10-10 (×3): qty 40

## 2014-10-10 MED ORDER — ACETAMINOPHEN 650 MG RE SUPP: 650.0000 mg | Freq: Four times a day (QID) | RECTAL | Status: DC | PRN

## 2014-10-10 MED ORDER — ACETAMINOPHEN 325 MG PO TABS: 650.0000 mg | ORAL_TABLET | Freq: Four times a day (QID) | ORAL | Status: DC | PRN

## 2014-10-10 MED ORDER — TORSEMIDE 20 MG PO TABS
60.0000 mg | ORAL_TABLET | Freq: Every day | ORAL | Status: DC
  Filled 2014-10-10: qty 3

## 2014-10-10 MED ORDER — ONDANSETRON HCL 4 MG PO TABS
4.0000 mg | ORAL_TABLET | Freq: Four times a day (QID) | ORAL | Status: DC | PRN
  Filled 2014-10-10: qty 1

## 2014-10-10 MED ORDER — DEXTROSE 5 % IV SOLN
1.0000 g | INTRAVENOUS | Status: DC
  Administered 2014-10-10 – 2014-10-13 (×4): 1 g via INTRAVENOUS
  Filled 2014-10-10 (×6): qty 1

## 2014-10-10 MED ORDER — MOMETASONE FURO-FORMOTEROL FUM 200-5 MCG/ACT IN AERO
2.0000 | INHALATION_SPRAY | Freq: Two times a day (BID) | RESPIRATORY_TRACT | Status: DC
  Administered 2014-10-10 – 2014-10-11 (×2): 2 via RESPIRATORY_TRACT
  Filled 2014-10-10 (×2): qty 8.8

## 2014-10-10 MED ORDER — INSULIN ASPART 100 UNIT/ML ~~LOC~~ SOLN
0.0000 [IU] | Freq: Three times a day (TID) | SUBCUTANEOUS | Status: DC
  Administered 2014-10-10: 1 [IU] via SUBCUTANEOUS
  Administered 2014-10-10: 3 [IU] via SUBCUTANEOUS
  Administered 2014-10-11: 2 [IU] via SUBCUTANEOUS
  Administered 2014-10-11 (×2): 1 [IU] via SUBCUTANEOUS
  Administered 2014-10-12: 2 [IU] via SUBCUTANEOUS
  Administered 2014-10-12: 1 [IU] via SUBCUTANEOUS
  Administered 2014-10-13: 2 [IU] via SUBCUTANEOUS

## 2014-10-10 MED ORDER — PANTOPRAZOLE SODIUM 40 MG PO TBEC: 40.0000 mg | DELAYED_RELEASE_TABLET | Freq: Two times a day (BID) | ORAL | Status: DC

## 2014-10-10 NOTE — Progress Notes (Signed)
[facility]  Patient Status: Active (receiving services up to time of hospitalization)  [facility] is providing the following services: RN, PT, OT and MSW  If patient discharges after hours, please call [phone].   [name] 10/10/2014, 11:52 AM

## 2014-10-10 NOTE — Consult Note (Signed)
Referring Physician:  Primary Physician: Tammi Sou, MD Primary Cardiologist: Dr. Haroldine Laws Reason for Consultation: Anticoagulation in the setting of falls  HPI: [name] is a 79 yo male with a history of HTN, CAD s/p CABG, ICM s/p Medtronic ICD, chronic systolic heart failure, DM2, Vtach, atrial flutter, PVD and GI mass. ECHO (12/22/13) EF 20-25%, Echo EF 40% (12/15) Mod-severe MR, EF 40-45% echo 08/2014.   On 01/24/2014 he was admitted from Dr. Rosezella [place] office with volume overload and low output heart failure. Creatinine was 2.8 on admit (baseline 1.6). Central line was placed to monitor CVP and CO-OX. Initial CO-OX was 49% and he was started on Milrinone and IV lasix. Discharge weight was 151 pounds. Creatinine was 1.42.   Saw Jeff Medoff for possible colon mass/thickening and felt to be result of previous diverticulitis. Anatomic difficulties to a colonoscopy, he has not had one.  Was admitted 2x in April 2016. In early April had a/c CHF with cardiorenal syndrome. Treated with milrinone and improved. Also had GI bleed at that time and EGD with candidal esophagitis. In late April admitted with PNA. Treated with abx. D/c weight was 147  Follow-up 09/27/2014: Returns with his wife for f/u. Last visit diuretics restarted. Had 10 pound weight gain (up to 159 pounds)and he was instructed to take an extra 60 mg torsemide. Says he feels better, complains of fatigue. SOB of breath with exertion. Denies PND/Orthopnea/CP. No melena. Using 2 liters [place] oxygen at night. Taking all medications.   Optivol- fluid well below threshold. Activity ~3 hours per day. No VT/AF  Echo 12/15 EF ~40% inferior AK moderate to severe MR. RV mildly HK Echo 4/16 EF 40-45% moderate to severe MR RV moderate dysfunction RVSP 78  Admitted 05/17 a.m. with recurrent falls, hemoglobin of 7. Stool positive for blood. Chest x-ray with possible pneumonia. CHF consult for anticoagulation management. Admit  weight 145 pounds.  Mr. Seltzer has fallen 5 or 6 times in the last few weeks. He denies palpitations, chest pain or increased dyspnea on exertion. He states his legs get weak and he goes down despite using a walker. He has not had any serious injuries, but has multiple bruises due to the falls. He states he has not gained weight and is eating but eats only small amounts of food at a time, normal for him. He does not feel that he is not eating enough. He has had some lower extremity edema but his weight has not fluctuated much. He has not had orthostatic dizziness, presyncope or syncope. He denies melena but has not had a bowel movement in several days. He denies any change in his chronic dyspnea on exertion, he is not having orthopnea or PND.   Review of Systems:     Cardiac Review of Systems: {Y] = yes [ ]  = no  Chest Pain [    ]  Resting SOB [   ] Exertional SOB  [ y ]  Orthopnea [  ]   Pedal Edema [   ]    Palpitations [  ] Syncope  [  ]   Presyncope [   ]  General Review of Systems: [Y] = yes [  ]=no Constitional: recent weight change [  ]; anorexia [  ]; fatigue [ y ]; nausea [  ]; night sweats [  ]; fever [  ]; or chills [  ];  Dental : poor dentition[  ];    Eye : blurred vision [  ]; diplopia [   ]; vision changes [  ];  Amaurosis fugax[  ]; Resp: cough [  ];  wheezing[  ];  hemoptysis[  ]; shortness of breath[  ]; paroxysmal nocturnal dyspnea[  ]; dyspnea on exertion[ y ]; or orthopnea[  ];  GI:  gallstones[  ], vomiting[  ];  dysphagia[  ]; melena[ ? ];  hematochezia [  ]; heartburn[  ];   Hx of  Colonoscopy[  ]; GU: kidney stones [  ]; hematuria[  ];   dysuria [  ];  nocturia[  ];  history of     obstruction [  ];                 Skin: rash, swelling[  ];, hair loss[  ];  peripheral edema[  ];  or itching[  ]; Musculosketetal: myalgias[  ];  joint  swelling[  ];  joint erythema[  ];  joint pain[  ];  back pain[ y ];  Heme/Lymph: bruising[  ];  bleeding[  ];  anemia[  ];  Neuro: TIA[  ];  headaches[  ];  stroke[  ];  vertigo[  ];  seizures[  ];   paresthesias[  ];  difficulty walking[  ];  Psych:depression[  ]; anxiety[  ];  Endocrine: diabetes[  ];  thyroid dysfunction[  ];  Immunizations: Flu [  ]; Pneumococcal[  ];  Other: Weakness  Past Medical History  Diagnosis Date  . VENTRICULAR TACHYCARDIA   . PERIPHERAL VASCULAR DISEASE   . HYPERTENSION     Hypertensive retinopathy-grade II OU  . HYPERCHOLESTEROLEMIA   . CORONARY ARTERY DISEASE     Hx of CABG  . Chronic systolic heart failure     Ischemic cardiomyopathy (01/2014 EF 40-45% with hypokinesis + small area of akinesis  . SEBORRHEIC DERMATITIS   . Chronic renal insufficiency, stage III (moderate)     CrCl 40s-50s  . DIVERTICULOSIS OF COLON   . COPD, severe   . COLONIC POLYPS   . ANXIETY   . ICD (implantable cardiac defibrillator) in place   . Type II diabetes mellitus     +background diabetic retinopathy OU  . Chronic lower back pain   . DJD (degenerative joint disease)     "hands" (05/24/2012)  . Osteoarthritis of finger   . History of atrial flutter   . Thrombus 12/2013    on ICD lead--started anticoag and his cardioversion for atrial flutter was postponed.  . Colon stricture 2015    with features worrisome for mass, also with recent rising CEA level (possible colon cancer)-GI MD= Dr. Franki Cabot recent eval/follow up with Dr. Earlean Shawl was summer 2014, at which time he recommended colonoscopy but pt declined.  . Macular degeneration, age related, nonexudative     OU   Past Surgical History  Procedure Laterality Date  . Coronary artery bypass graft  1990's    CABG X4  . Tonsillectomy  ?1942  . Cardiac defibrillator placement  12/2004    single chamber defibrillator- Medtronic Maxima 8/06 DrKlein [Other][  . Tee without cardioversion N/A 12/22/2013    Procedure:  TRANSESOPHAGEAL ECHOCARDIOGRAM (TEE);  Surgeon: Thayer Headings, MD;  Location: [facility];  Service: Cardiovascular;  Laterality: N/A;  . Cardioversion N/A 12/22/2013    Procedure: CARDIOVERSION;  Surgeon: Thayer Headings, MD;  Location: [facility];  Service: Cardiovascular;  Laterality: N/A;  . Cardiovascular stress test  01/12/13    myocard perf imaging: previous infarct with a moderately reduced EF as was known previously.  No new findings.   . Gastric emptying scan  02/02/14    Normal  . Transthoracic echocardiogram  01/25/14; 04/2014    EF 40-45%, diffuse hypokinesis, infero-basilar myocardial akinesis, PA pressure slightly high, valves fine.04/2014-Echo today EF ~40% inferior AK moderate to severe MR  . Abdominal ultrasound  01/2014    GB sludge, o/w normal  . Ct virtual colonoscopy diagnostic  11/2012    Asymmetric thickening of the rectosigmoid colon worrisome for  . Cardiovascular stress test      Lexiscan: There is a medium sized fixed inferior wall defect of moderate severity involving the apical inferior and mid inferior and basal inferoseptal segments, consistent with old inferior MI. No significant reversible ischemia.  EF 34%, inferior wall hypokinesis--intermediate risk scan (ok to do planned procedure)  . Implantable cardioverter defibrillator revision N/A 05/25/2012    Procedure: IMPLANTABLE CARDIOVERTER DEFIBRILLATOR REVISION;  Surgeon: Evans Lance, MD;  Location: [facility] CATH LAB;  Service: Cardiovascular;  Laterality: N/A;  . Right heart catheterization N/A 08/31/2014    Procedure: RIGHT HEART CATH;  Surgeon: Larey Dresser, MD;  Location: [facility] CATH LAB;  Service: Cardiovascular;  Laterality: N/A;  . Esophagogastroduodenoscopy N/A 09/03/2014    Procedure: ESOPHAGOGASTRODUODENOSCOPY (EGD);  Surgeon: Carol Ada, MD;  Location: [facility] ENDOSCOPY;  Service: Endoscopy;  Laterality: N/A;   Current Medications: . amiodarone  200 mg Oral Daily  . carvedilol  3.125 mg Oral BID WC  .  ceFEPime (MAXIPIME) IV  1 g Intravenous Q24H  . folic acid  1 mg Oral Daily  . hydrALAZINE  25 mg Oral TID  . insulin aspart  0-9 Units Subcutaneous TID WC  . insulin glargine  10 Units Subcutaneous QHS  . isosorbide mononitrate  30 mg Oral Daily  . levothyroxine  75 mcg Oral QAC breakfast  . magnesium sulfate 1 - 4 g bolus IVPB  2 g Intravenous Once  . mometasone-formoterol  2 puff Inhalation BID  . pantoprazole (PROTONIX) IV  40 mg Intravenous Q12H  . potassium chloride  40 mEq Oral TID  . sodium chloride  3 mL Intravenous Q12H  . thiamine  100 mg Oral Daily  . tiotropium  18 mcg Inhalation Daily  . vancomycin  750 mg Intravenous Q24H   Infusions:    Medication Sig  . amiodarone (PACERONE) 200 MG tablet Take 1 tablet (200 mg total) by mouth daily.  Marland Kitchen apixaban (ELIQUIS) 5 MG TABS tablet Take 1 tablet (5 mg total) by mouth 2 (two) times daily.  . carvedilol (COREG) 3.125 MG tablet Take 1 tablet (3.125 mg total) by mouth 2 (two) times daily with a meal.  . Fluticasone-Salmeterol (ADVAIR) 500-50 MCG/DOSE AEPB Inhale 1 puff into the lungs 2 (two) times daily.  . folic acid (FOLVITE) 1 MG tablet Take 1 tablet (1 mg total) by mouth daily.  . hydrALAZINE (APRESOLINE) 25 MG tablet Take 25 mg by mouth 3 (three) times daily.  . Insulin Glargine (LANTUS SOLOSTAR) 100 UNIT/ML Solostar Pen 10 units SQ qhs (Patient taking differently: Inject 10 Units into the skin at bedtime. )  . insulin lispro (HUMALOG KWIKPEN) 100 UNIT/ML KiwkPen Take 7 units in the morning, 8 units at noon, and 9 units at bedtime.  . isosorbide mononitrate (IMDUR) 30 MG 24 hr tablet Take 30 mg by mouth daily.  . isosorbide mononitrate (IMDUR) 30 MG 24 hr tablet TAKE ONE TABLET BY  MOUTH DAILY  . levofloxacin (LEVAQUIN) 750 MG tablet Take 1 tablet (750 mg total) by mouth every other day.  . levothyroxine (SYNTHROID, LEVOTHROID) 75 MCG tablet Take 1 tablet (75 mcg total) by mouth daily.  . metolazone (ZAROXOLYN) 2.5 MG tablet Take  on mondays and Fridays  . pantoprazole (PROTONIX) 40 MG tablet Take 1 tablet (40 mg total) by mouth 2 (two) times daily.  . potassium chloride SA (KLOR-CON M20) 20 MEQ tablet Take 1 tablet (20 mEq total) by mouth 2 (two) times daily.  Marland Kitchen SPIRIVA HANDIHALER 18 MCG inhalation capsule PLACE ONE CAPSULE INTO INHALER AND  INHALE  DAILY  . thiamine 100 MG tablet Take 1 tablet (100 mg total) by mouth daily.  Marland Kitchen tiotropium (SPIRIVA) 18 MCG inhalation capsule Place 18 mcg into inhaler and inhale every evening.   . tobramycin (TOBREX) 0.3 % ophthalmic solution Place 2 drops into both eyes every 6 (six) hours.  . torsemide (DEMADEX) 20 MG tablet Take 3 tablets (60 mg total) by mouth daily.   Allergies  Allergen Reactions  . Niacin Itching    Niaspan     History   Social History  . Marital Status: Married    Spouse Name: N/A  . Number of Children: N/A  . Years of Education: N/A   Occupational History  . Retired    Social History Main Topics  . Smoking status: Former Smoker -- 2.00 packs/day for 50 years    Types: Cigarettes    Quit date: 06/24/2000  . Smokeless tobacco: Never Used  . Alcohol Use: 0.0 oz/week    0 Standard drinks or equivalent per week     Comment: 05/24/2012 "used to drink alot; quit > 10 yr ago"  . Drug Use: No  . Sexual Activity: No   Other Topics Concern  . Not on file   Social History Narrative   Lives in [place] with his wife.    Family History  Problem Relation Age of Onset  . Heart attack Father 11  . Heart attack Brother     Vague history   Family Status  Relation Status Death Age  . Mother Deceased   . Father Deceased     PHYSICAL EXAM: Filed Vitals:   10/10/14 1000  BP: 106/50  Pulse: 55  Temp:   Resp: 14  BP 99/36 mmHg  Pulse 55  Temp(Src) 97.7 F (36.5 C) (Oral)  Resp 16  Ht 5\' 7"  (1.702 m)  Wt 145 lb 15.1 oz (66.2 kg)  BMI 22.85 kg/m2  SpO2 100%   Intake/Output Summary (Last 24 hours) at 10/10/14 1037 Last data filed at  10/10/14 0856  Gross per 24 hour  Intake   1345 ml  Output      0 ml  Net   1345 ml    General:  Chronically-ill appearing. Mild respiratory difficulty HEENT: normal for age Neck: supple. JVD 7 centimeters. Carotids 2+ bilat; no bruits. No lymphadenopathy or thryomegaly appreciated. Cor: PMI nondisplaced. Regular rate & rhythm. No rubs, gallops; 2/6 murmurs. Lungs: rales bases, decreased breath sounds Abdomen: soft, nontender, distended. No hepatosplenomegaly. No bruits or masses. Good bowel sounds. Positive hepatojugular reflux Extremities: no cyanosis, clubbing, rash, 1-2+ bilateral LE edema Neuro: alert & oriented x 3, cranial nerves grossly intact. moves all 4 extremities w/o difficulty. Affect pleasant. Skin: Multiple areas of ecchymosis  ECG: 10/09/2014 SR, LBBB  ECHO: 08/31/2014 Study Conclusions - Left ventricle: The cavity size was normal. Wall thickness was increased in a  pattern of mild LVH. Systolic function was mildly to moderately reduced. The estimated ejection fraction was in the range of 40% to 45%. There is hypokinesis of the basal-midinferoseptal myocardium. Features are consistent with a pseudonormal left ventricular filling pattern, with concomitant abnormal relaxation and increased filling pressure (grade 2 diastolic dysfunction). - Mitral valve: There was moderate to severe regurgitation. - Left atrium: The atrium was moderately to severely dilated. - Right ventricle: The cavity size was moderately dilated. Wall thickness was normal. - Right atrium: The atrium was moderately to severely dilated. - Tricuspid valve: There was moderate regurgitation. - Pulmonary arteries: Systolic pressure was severely increased. PA peak pressure: 78 mm Hg (S). Impressions: - EF has improved since last echocardiogram.  Results for orders placed or performed during the hospital encounter of 10/09/14 (from the past 24 hour(s))  CBC with Differential      Status: Abnormal   Collection Time: 10/09/14 10:37 PM  Result Value Ref Range   WBC 9.8 4.0 - 10.5 K/uL   RBC 2.33 (L) 4.22 - 5.81 MIL/uL   Hemoglobin 7.2 (L) 13.0 - 17.0 g/dL   HCT 20.8 (L) 39.0 - 52.0 %   MCV 89.3 78.0 - 100.0 fL   MCH 30.9 26.0 - 34.0 pg   MCHC 34.6 30.0 - 36.0 g/dL   RDW 18.9 (H) 11.5 - 15.5 %   Platelets 125 (L) 150 - 400 K/uL   Neutrophils Relative % 66 43 - 77 %   Neutro Abs 6.5 1.7 - 7.7 K/uL   Lymphocytes Relative 20 12 - 46 %   Lymphs Abs 1.9 0.7 - 4.0 K/uL   Monocytes Relative 13 (H) 3 - 12 %   Monocytes Absolute 1.2 (H) 0.1 - 1.0 K/uL   Eosinophils Relative 1 0 - 5 %   Eosinophils Absolute 0.1 0.0 - 0.7 K/uL   Basophils Relative 0 0 - 1 %   Basophils Absolute 0.0 0.0 - 0.1 K/uL  Comprehensive metabolic panel     Status: Abnormal   Collection Time: 10/09/14 10:37 PM  Result Value Ref Range   Sodium 125 (L) 135 - 145 mmol/L   Potassium 2.6 (LL) 3.5 - 5.1 mmol/L   Chloride 78 (L) 101 - 111 mmol/L   CO2 33 (H) 22 - 32 mmol/L   Glucose, Bld 99 65 - 99 mg/dL   BUN 54 (H) 6 - 20 mg/dL   Creatinine, Ser 2.31 (H) 0.61 - 1.24 mg/dL   Calcium 8.3 (L) 8.9 - 10.3 mg/dL   Total Protein 5.8 (L) 6.5 - 8.1 g/dL   Albumin 2.4 (L) 3.5 - 5.0 g/dL   AST 81 (H) 15 - 41 U/L   ALT 22 17 - 63 U/L   Alkaline Phosphatase 90 38 - 126 U/L   Total Bilirubin 1.2 0.3 - 1.2 mg/dL   GFR calc non Af Amer 26 (L) >60 mL/min   GFR calc Af Amer 30 (L) >60 mL/min   Anion gap 14 5 - 15  Brain natriuretic peptide     Status: Abnormal   Collection Time: 10/09/14 10:37 PM  Result Value Ref Range   B Natriuretic Peptide 715.8 (H) 0.0 - 100.0 pg/mL  I-Stat Troponin, ED - 0, 3, 6 hours (not at [facility])     Status: None   Collection Time: 10/09/14 10:42 PM  Result Value Ref Range   Troponin i, poc 0.04 0.00 - 0.08 ng/mL   Comment 3          I-Stat  CG4 Lactic Acid, ED     Status: Abnormal   Collection Time: 10/09/14 10:44 PM  Result Value Ref Range   Lactic Acid, Venous 2.72 (HH) 0.5 -  2.0 mmol/L   Comment NOTIFIED PHYSICIAN   POC occult blood, ED Provider will collect     Status: Abnormal   Collection Time: 10/09/14 11:29 PM  Result Value Ref Range   Fecal Occult Bld POSITIVE (A) NEGATIVE  Type and screen     Status: None (Preliminary result)   Collection Time: 10/10/14 12:01 AM  Result Value Ref Range   ABO/RH(D) O POS    Antibody Screen NEG    Sample Expiration 10/13/2014    Unit Number [id number]    Blood Component Type RED CELLS,LR    Unit division 00    Status of Unit ISSUED    Transfusion Status OK TO TRANSFUSE    Crossmatch Result Compatible   Prepare RBC     Status: None   Collection Time: 10/10/14 12:10 AM  Result Value Ref Range   Order Confirmation ORDER PROCESSED BY BLOOD BANK   Urinalysis, Routine w reflex microscopic     Status: None   Collection Time: 10/10/14 12:15 AM  Result Value Ref Range   Color, Urine YELLOW YELLOW   APPearance CLEAR CLEAR   Specific Gravity, Urine 1.010 1.005 - 1.030   pH 6.5 5.0 - 8.0   Glucose, UA NEGATIVE NEGATIVE mg/dL   Hgb urine dipstick NEGATIVE NEGATIVE   Bilirubin Urine NEGATIVE NEGATIVE   Ketones, ur NEGATIVE NEGATIVE mg/dL   Protein, ur NEGATIVE NEGATIVE mg/dL   Urobilinogen, UA 1.0 0.0 - 1.0 mg/dL   Nitrite NEGATIVE NEGATIVE   Leukocytes, UA NEGATIVE NEGATIVE  POCT i-Stat troponin I     Status: None   Collection Time: 10/10/14  1:19 AM  Result Value Ref Range   Troponin i, poc 0.05 0.00 - 0.08 ng/mL   Comment 3          I-Stat CG4 Lactic Acid, ED     Status: Abnormal   Collection Time: 10/10/14  1:22 AM  Result Value Ref Range   Lactic Acid, Venous 2.37 (HH) 0.5 - 2.0 mmol/L   Comment NOTIFIED PHYSICIAN   MRSA PCR Screening     Status: None   Collection Time: 10/10/14  1:49 AM  Result Value Ref Range   MRSA by PCR NEGATIVE NEGATIVE  Comprehensive metabolic panel     Status: Abnormal   Collection Time: 10/10/14  5:29 AM  Result Value Ref Range   Sodium 126 (L) 135 - 145 mmol/L   Potassium  2.9 (L) 3.5 - 5.1 mmol/L   Chloride 86 (L) 101 - 111 mmol/L   CO2 32 22 - 32 mmol/L   Glucose, Bld 123 (H) 65 - 99 mg/dL   BUN 48 (H) 6 - 20 mg/dL   Creatinine, Ser 2.02 (H) 0.61 - 1.24 mg/dL   Calcium 7.8 (L) 8.9 - 10.3 mg/dL   Total Protein 5.0 (L) 6.5 - 8.1 g/dL   Albumin 2.1 (L) 3.5 - 5.0 g/dL   AST 75 (H) 15 - 41 U/L   ALT 21 17 - 63 U/L   Alkaline Phosphatase 82 38 - 126 U/L   Total Bilirubin 2.6 (H) 0.3 - 1.2 mg/dL   GFR calc non Af Amer 30 (L) >60 mL/min   GFR calc Af Amer 35 (L) >60 mL/min   Anion gap 8 5 - 15  CBC with Differential/Platelet  Status: Abnormal   Collection Time: 10/10/14  5:29 AM  Result Value Ref Range   WBC 12.6 (H) 4.0 - 10.5 K/uL   RBC 2.65 (L) 4.22 - 5.81 MIL/uL   Hemoglobin 8.1 (L) 13.0 - 17.0 g/dL   HCT 23.4 (L) 39.0 - 52.0 %   MCV 88.3 78.0 - 100.0 fL   MCH 30.6 26.0 - 34.0 pg   MCHC 34.6 30.0 - 36.0 g/dL   RDW 18.0 (H) 11.5 - 15.5 %   Platelets 122 (L) 150 - 400 K/uL   Neutrophils Relative % 72 43 - 77 %   Neutro Abs 9.1 (H) 1.7 - 7.7 K/uL   Lymphocytes Relative 13 12 - 46 %   Lymphs Abs 1.7 0.7 - 4.0 K/uL   Monocytes Relative 13 (H) 3 - 12 %   Monocytes Absolute 1.6 (H) 0.1 - 1.0 K/uL   Eosinophils Relative 2 0 - 5 %   Eosinophils Absolute 0.2 0.0 - 0.7 K/uL   Basophils Relative 0 0 - 1 %   Basophils Absolute 0.0 0.0 - 0.1 K/uL  Magnesium     Status: Abnormal   Collection Time: 10/10/14  5:29 AM  Result Value Ref Range   Magnesium 1.5 (L) 1.7 - 2.4 mg/dL   Radiology:  Dg Chest 2 View 10/09/2014   CLINICAL DATA:  Weakness, shortness of breath.  History of COPD.  EXAM: CHEST  2 VIEW  COMPARISON:  09/11/2014  FINDINGS: Lungs are hyperinflated with emphysema. There are bilateral calcified pleural plaques. Dual lead left-sided pacemaker remains in place. Patient is post median sternotomy. Stable cardiomegaly. No pulmonary edema. Improved right upper lobe opacity compared to prior. Increased opacity at the right costophrenic angle may  reflect new focal consolidation, atelectasis, or less likely pleural effusion. No left pleural effusion. No pneumothorax. No acute osseous abnormalities are seen.  IMPRESSION: Increasing opacity at the right costophrenic angle that is new/ progressed from prior exams and may reflect atelectasis, pneumonia or less likely pleural effusion.  Underlying chronic lung disease, bilateral pleural plaques and emphysema. Stable cardiomegaly.   Electronically Signed   By: Jeb Levering M.D.   On: 10/09/2014 23:23   Dg Thoracic Spine W/swimmers 10/09/2014   CLINICAL DATA:  Acute onset of chronic upper back pain. Initial encounter.  EXAM: THORACIC SPINE - 2 VIEW + SWIMMERS  COMPARISON:  Chest radiograph performed 09/11/2014  FINDINGS: There is no evidence of fracture or subluxation. Vertebral bodies demonstrate normal height and alignment. Mild degenerative change is noted along the cervical spine.  The visualized portions of both lungs are clear. The mediastinum is borderline normal in size. The patient is status post median sternotomy, with evidence of prior CABG. AICD leads are noted.  IMPRESSION: No evidence of fracture or subluxation along the cervical spine.   Electronically Signed   By: Garald Balding M.D.   On: 10/09/2014 23:22   Dg Lumbar Spine 2-3 Views 10/09/2014   CLINICAL DATA:  Chronic lower back pain.  Initial encounter.  EXAM: LUMBAR SPINE - 2-3 VIEW  COMPARISON:  CT of the abdomen and pelvis performed 12/07/2012  FINDINGS: There is no evidence of acute fracture or subluxation. Vertebral bodies demonstrate normal height. There is grade 1 anterolisthesis of L3 on L4. Mild disc space narrowing is noted at L2-L3 and L5-S1. Associated endplate sclerotic change is noted.  The visualized bowel gas pattern is unremarkable in appearance; air and stool are noted within the colon. The sacroiliac joints are within normal limits. Scattered  vascular calcifications are seen.  IMPRESSION: 1. No evidence of acute fracture  or subluxation along the lumbar spine. 2. Mild degenerative change noted along the lumbar spine. 3. Scattered vascular calcifications seen.   Electronically Signed   By: Garald Balding M.D.   On: 10/09/2014 23:24   Ct Head Wo Contrast 10/09/2014   CLINICAL DATA:  Acute onset of generalized weakness. Back pain. Frequent falls. Worsening confusion. Initial encounter.  EXAM: CT HEAD WITHOUT CONTRAST  TECHNIQUE: Contiguous axial images were obtained from the base of the skull through the vertex without intravenous contrast.  COMPARISON:  CT of the neck performed 03/11/2007  FINDINGS: There is no evidence of acute infarction, mass lesion, or intra- or extra-axial hemorrhage on CT.  Prominence of the sulci suggests mild cortical volume loss. Mild cerebellar atrophy is noted. Mild periventricular white matter change likely reflects small vessel ischemic microangiopathy.  The brainstem and fourth ventricle are within normal limits. The third and lateral ventricles, and basal ganglia are unremarkable in appearance. The cerebral hemispheres are symmetric in appearance, with normal gray-white differentiation. No mass effect or midline shift is seen.  There is no evidence of fracture; visualized osseous structures are unremarkable in appearance. The orbits are within normal limits. The paranasal sinuses and mastoid air cells are well-aerated. No significant soft tissue abnormalities are seen.  IMPRESSION: 1. No acute intracranial pathology seen on CT. 2. Mild cortical volume loss and scattered small vessel ischemic microangiopathy.   Electronically Signed   By: Garald Balding M.D.   On: 10/09/2014 23:46    ASSESSMENT: Principal Problem:   GI bleed Active Problems:   Essential hypertension   COPD, severe   Chronic systolic CHF (congestive heart failure), NYHA class 4   CKD (chronic kidney disease) stage 3, GFR 30-59 ml/min   Anemia   Diabetes mellitus type 2, controlled PAF - CHADSVASC =  6  This patients  CHA2DS2-VASc Score and unadjusted Ischemic Stroke Rate (% per year) is equal to 9.7 % stroke rate/year from a score of 6  Above score calculated as 1 point each if present [CHF, HTN, DM, Vascular=MI/PAD/Aortic Plaque, Age if 65-74, or Male] Above score calculated as 2 points each if present [Age > 75, or Stroke/TIA/TE]    PLAN/DISCUSSION:  IM has ordered 2 units PRBCs. In April 2015, he was evaluated by Dr. Benson Norway for heme positive stools but his hemoglobin was stable on Eliquis and EGD showed no overt evidence of bleeding, mild candidal esophagitis. He does not remember any dark or tarry stools, but has not had a BM in 3 days.  5-6 falls recently, unclear if related to decreased hemoglobin or deconditioning. He also has some volume overload and would benefit from diuresis. M.D. advise on diuresis.  Lenoard Aden 10/10/2014 10:37 AM Beeper [phone]  Patient seen and examined with Rosaria Ferries, PA-C. We discussed all aspects of the encounter. I agree with the assessment and plan as stated above.   Volume status looks good. Main issue is recurrent anemia in setting of chronic anticoagulation for PAF with CHADSVaSc of 6. He feels better after being transfused. Given need for [facility] would favor colonoscopy to further evaluate source of bleeding and help [place] better risk stratify him for further anti-coagulation. It seems his falls and leg weakness were mostly related to anemia but will need PT eval. Would hold AC for now. Resume po diuretics in am. Place TED hose and SCDs. We will follow.   Tamyra Fojtik,MD 5:01 PM

## 2014-10-10 NOTE — Consult Note (Signed)
[facility] Consult: 1:48 PM 10/10/2014  LOS: 0 days    Referring Provider: Dr Allyson Sabal.   Primary Care Physician:  Tammi Sou, MD Primary Gastroenterologist:  Dr. Benson Norway saw patient for/2016 while inpatient. Performed EGD then.    Reason for Consultation:  Anemia   HPI: [name] is a 79 y.o. male.   Hx CAD, CABG, on Eliquis for a fib, ischemic CM, CKD, COPD, IDDM  11/2012 virtual colonoscopy Recto sigmoid stricture with asymmetric thickening, worrisome for sigmoid colon cancer. 5 mm polyp proximal to the anal verge. Diverticulosis.  Currently Dr. Earlean Shawl said that sigmoid changes were probably the result of previous diarrhea diverticulitis. Due to anatomic challenges, colonoscopy was not immediately pursued and the patient also declined colonoscopy  Admitted in April/2016 with pneumonia and melena. Required transfusion of 2 units of packed red blood cells for anemia. Also required treatment with norepinephrine and milrinone for heart failure during that admission. 09/03/14 EGD  Dr Benson Norway: For melena and anemia. ENDOSCOPIC IMPRESSION: 1) Candidal esophagitis. 2) Retained gastric contents RECOMMENDATIONS: 1) Resume Eliquis. 2) Follow HGB and transfuse if necessary. 3) If HGB drops and/or melena recurs a capsule endoscopy should be pursued. 4) Fluconazole x 10 days for the Candidal esophagitis.  Presented to the unit with recurrent falls. Chest x-ray showing possible pneumonia and he has been started on antibiotic Hemoglobin measured 7.2.  Last month it ranged from 9-1/2-10. However in the midst of his GI bleed, 48/2016 it measured 7.7.  Stool is FOBT positive.    Patient does not see blood per rectum. Hasn't had recurrent melena. Prior to April, he had never required transfusions. Never really  required supplemental iron prescription. No nausea or vomiting.  Abdominal girth is stable. No history of liver disease.  No difficulty swallowing. He lost a significant amount of weight during his previous hospitalization and diuresis.  Generally moves his bowels every day but occasionally has constipation where he moves his bowels every 2-3 days.  Cough and shortness of breath are stable. No chest pain, no palpitations.  Positive lower extremity edema. He says that when he falls he is not syncopal but he just simply is weak and falls down. He has sustained some bruising, primarily on the trunk as result of these falls.    Past Medical History  Diagnosis Date  . VENTRICULAR TACHYCARDIA   . PERIPHERAL VASCULAR DISEASE   . HYPERTENSION     Hypertensive retinopathy-grade II OU  . HYPERCHOLESTEROLEMIA   . CORONARY ARTERY DISEASE     Hx of CABG  . Chronic systolic heart failure     Ischemic cardiomyopathy (01/2014 EF 40-45% with hypokinesis + small area of akinesis  . SEBORRHEIC DERMATITIS   . Chronic renal insufficiency, stage III (moderate)     CrCl 40s-50s  . DIVERTICULOSIS OF COLON   . COPD, severe   . COLONIC POLYPS   . ANXIETY   . ICD (implantable cardiac defibrillator) in place   . Type II diabetes mellitus     +background diabetic retinopathy OU  . Chronic lower back  pain   . DJD (degenerative joint disease)     "hands" (05/24/2012)  . Osteoarthritis of finger   . History of atrial flutter   . Thrombus 12/2013    on ICD lead--started anticoag and his cardioversion for atrial flutter was postponed.  . Colon stricture 2015    with features worrisome for mass, also with recent rising CEA level (possible colon cancer)-GI MD= Dr. Franki Cabot recent eval/follow up with Dr. Earlean Shawl was summer 2014, at which time he recommended colonoscopy but pt declined.  . Macular degeneration, age related, nonexudative     OU    Past Surgical History  Procedure Laterality Date  . Coronary  artery bypass graft  1990's    CABG X4  . Tonsillectomy  ?1942  . Cardiac defibrillator placement  12/2004    single chamber defibrillator- Medtronic Maxima 8/06 DrKlein [Other][  . Tee without cardioversion N/A 12/22/2013    Procedure: TRANSESOPHAGEAL ECHOCARDIOGRAM (TEE);  Surgeon: Thayer Headings, MD;  Location: [place];  Service: Cardiovascular;  Laterality: N/A;  . Cardioversion N/A 12/22/2013    Procedure: CARDIOVERSION;  Surgeon: Thayer Headings, MD;  Location: [facility] ENDOSCOPY;  Service: Cardiovascular;  Laterality: N/A;  . Cardiovascular stress test  01/12/13    myocard perf imaging: previous infarct with a moderately reduced EF as was known previously.  No new findings.   . Gastric emptying scan  02/02/14    Normal  . Transthoracic echocardiogram  01/25/14; 04/2014    EF 40-45%, diffuse hypokinesis, infero-basilar myocardial akinesis, PA pressure slightly high, valves fine.04/2014-Echo today EF ~40% inferior AK moderate to severe MR  . Abdominal ultrasound  01/2014    GB sludge, o/w normal  . Ct virtual colonoscopy diagnostic  11/2012    Asymmetric thickening of the rectosigmoid colon worrisome for  . Cardiovascular stress test      Lexiscan: There is a medium sized fixed inferior wall defect of moderate severity involving the apical inferior and mid inferior and basal inferoseptal segments, consistent with old inferior MI. No significant reversible ischemia.  EF 34%, inferior wall hypokinesis--intermediate risk scan (ok to do planned procedure)  . Implantable cardioverter defibrillator revision N/A 05/25/2012    Procedure: IMPLANTABLE CARDIOVERTER DEFIBRILLATOR REVISION;  Surgeon: Evans Lance, MD;  Location: [facility] CATH LAB;  Service: Cardiovascular;  Laterality: N/A;  . Right heart catheterization N/A 08/31/2014    Procedure: RIGHT HEART CATH;  Surgeon: Larey Dresser, MD;  Location: [facility] C[facility] LAB;  Service: Cardiovascular;  Laterality: N/A;  . Esophagogastroduodenoscopy N/A 09/03/2014     Procedure: ESOPHAGOGASTRODUODENOSCOPY (EGD);  Surgeon: Carol Ada, MD;  Location: [facility] ENDOSCOPY;  Service: Endoscopy;  Laterality: N/A;    Prior to Admission medications   Medication Sig Start Date End Date Taking? Authorizing Provider  amiodarone (PACERONE) 200 MG tablet Take 1 tablet (200 mg total) by mouth daily. 09/14/14   Allie Bossier, MD  apixaban (ELIQUIS) 5 MG TABS tablet Take 1 tablet (5 mg total) by mouth 2 (two) times daily. 09/14/14   Allie Bossier, MD  carvedilol (COREG) 3.125 MG tablet Take 1 tablet (3.125 mg total) by mouth 2 (two) times daily with a meal. 09/14/14   Allie Bossier, MD  Fluticasone-Salmeterol (ADVAIR) 500-50 MCG/DOSE AEPB Inhale 1 puff into the lungs 2 (two) times daily. 04/05/14   Noralee Space, MD  folic acid (FOLVITE) 1 MG tablet Take 1 tablet (1 mg total) by mouth daily. 09/14/14   Allie Bossier, MD  hydrALAZINE (APRESOLINE) 25 MG  tablet Take 25 mg by mouth 3 (three) times daily.    Historical Provider, MD  Insulin Glargine (LANTUS SOLOSTAR) 100 UNIT/ML Solostar Pen 10 units SQ qhs Patient taking differently: Inject 10 Units into the skin at bedtime.  02/10/14   Tammi Sou, MD  insulin lispro (HUMALOG KWIKPEN) 100 UNIT/ML KiwkPen Take 7 units in the morning, 8 units at noon, and 9 units at bedtime. 10/02/14   Tammi Sou, MD  isosorbide mononitrate (IMDUR) 30 MG 24 hr tablet Take 30 mg by mouth daily.    Historical Provider, MD  isosorbide mononitrate (IMDUR) 30 MG 24 hr tablet TAKE ONE TABLET BY MOUTH DAILY 09/26/14   Amy D Clegg, NP  levofloxacin (LEVAQUIN) 750 MG tablet Take 1 tablet (750 mg total) by mouth every other day. 09/14/14   Allie Bossier, MD  levothyroxine (SYNTHROID, LEVOTHROID) 75 MCG tablet Take 1 tablet (75 mcg total) by mouth daily. 09/18/14   Tammi Sou, MD  metolazone (ZAROXOLYN) 2.5 MG tablet Take on mondays and Fridays 09/27/14   Jolaine Artist, MD  pantoprazole (PROTONIX) 40 MG tablet Take 1 tablet (40 mg total) by mouth 2  (two) times daily. 09/05/14   Amy D Clegg, NP  potassium chloride SA (KLOR-CON M20) 20 MEQ tablet Take 1 tablet (20 mEq total) by mouth 2 (two) times daily. 09/27/14   Jolaine Artist, MD  SPIRIVA HANDIHALER 18 MCG inhalation capsule PLACE ONE CAPSULE INTO INHALER AND  INHALE  DAILY 09/18/14   Noralee Space, MD  thiamine 100 MG tablet Take 1 tablet (100 mg total) by mouth daily. 09/14/14   Allie Bossier, MD  tiotropium (SPIRIVA) 18 MCG inhalation capsule Place 18 mcg into inhaler and inhale every evening.     Historical Provider, MD  tobramycin (TOBREX) 0.3 % ophthalmic solution Place 2 drops into both eyes every 6 (six) hours. 09/14/14   Allie Bossier, MD  torsemide (DEMADEX) 20 MG tablet Take 3 tablets (60 mg total) by mouth daily. 09/27/14   Jolaine Artist, MD    Scheduled Meds: . amiodarone  200 mg Oral Daily  . carvedilol  3.125 mg Oral BID WC  . ceFEPime (MAXIPIME) IV  1 g Intravenous Q24H  . folic acid  1 mg Oral Daily  . hydrALAZINE  25 mg Oral TID  . insulin aspart  0-9 Units Subcutaneous TID WC  . insulin glargine  10 Units Subcutaneous QHS  . isosorbide mononitrate  30 mg Oral Daily  . levothyroxine  75 mcg Oral QAC breakfast  . mometasone-formoterol  2 puff Inhalation BID  . pantoprazole (PROTONIX) IV  40 mg Intravenous Q12H  . potassium chloride  40 mEq Oral TID  . sodium chloride  3 mL Intravenous Q12H  . thiamine  100 mg Oral Daily  . tiotropium  18 mcg Inhalation Daily  . vancomycin  750 mg Intravenous Q24H   Infusions:   PRN Meds: acetaminophen **OR** acetaminophen, ondansetron **OR** ondansetron (ZOFRAN) IV   Allergies as of 10/09/2014 - Review Complete 10/09/2014  Allergen Reaction Noted  . Niacin Itching     Family History  Problem Relation Age of Onset  . Heart attack Father 63  . Heart attack Brother     Vague history    History   Social History  . Marital Status: Married    Spouse Name: N/A  . Number of Children: N/A  . Years of Education:  N/A   Occupational History  . Retired  Social History Main Topics  . Smoking status: Former Smoker -- 2.00 packs/day for 50 years    Types: Cigarettes    Quit date: 06/24/2000  . Smokeless tobacco: Never Used  . Alcohol Use: 0.0 oz/week    0 Standard drinks or equivalent per week     Comment: 05/24/2012 "used to drink alot; quit > 10 yr ago"  . Drug Use: No  . Sexual Activity: No   Other Topics Concern  . Not on file   Social History Narrative   Lives in [place] with his wife.    REVIEW OF SYSTEMS: Per HPI.  Complete 12 system review performed. Results of these as above.   PHYSICAL EXAM: Vital signs in last 24 hours: Filed Vitals:   10/10/14 1200  BP: 108/38  Pulse: 56  Temp: 97.4 F (36.3 C)  Resp: 14   Wt Readings from Last 3 Encounters:  10/10/14 145 lb 15.1 oz (66.2 kg)  09/27/14 151 lb 12.8 oz (68.856 kg)  09/14/14 147 lb 11.3 oz (67 kg)    General: Ill, weak, unwell-appearing Head:  No facial asymmetry or swelling  Eyes:  Positive conjunctival pallor. Ears:  Slightly HOH  Nose:  No congestion or discharge Mouth:  Upper dentures. Lower teeth in good repair. Moist, clear oral mucosa. Neck:  No JVD, no masses, no TMG. Lungs:  Rales at the bases. Overall decreased breath sounds. Wet sounding cough. Heart: RRR. No MRG. 2/6 SM. Abdomen:  Soft, [place].  Protuberant.  Anasarca.  Some bruising.  NT.  BS active.   Rectal: Deferred rectal exam.   Musc/Skeltl: No joint erythema or swelling. Extremities:  2-3+ pedal edema on the right foot. No edema on the left foot.  Neurologic:  Oriented 3. No tremor. No limb weakness. Grossly neurologically intact. Skin:  No telangiectasia, rash, or sore.  Ecchymosis evident on the posterior right lateral thoracic rib area as well as near the flank. Tattoos:  None seen Nodes:  No cervical adenopathy.   Psych:  Affect blunted but cooperative. Not agitated.  Intake/Output from previous day: 05/16 0701 - 05/17 0700 In:  1295 [Blood:645; IV Piggyback:650] Out: -  Intake/Output this shift: Total I/O In: 50 [IV Piggyback:50] Out: 500 [Urine:500]  LAB RESULTS:  Recent Labs  10/09/14 2237 10/10/14 0529  WBC 9.8 12.6*  HGB 7.2* 8.1*  HCT 20.8* 23.4*  PLT 125* 122*   BMET Lab Results  Component Value Date   NA 126* 10/10/2014   NA 125* 10/09/2014   NA 124* 09/27/2014   K 2.9* 10/10/2014   K 2.6* 10/09/2014   K 3.6 09/27/2014   CL 86* 10/10/2014   CL 78* 10/09/2014   CL 85* 09/27/2014   CO2 32 10/10/2014   CO2 33* 10/09/2014   CO2 29 09/27/2014   GLUCOSE 123* 10/10/2014   GLUCOSE 99 10/09/2014   GLUCOSE 110* 09/27/2014   BUN 48* 10/10/2014   BUN 54* 10/09/2014   BUN 37* 09/27/2014   CREATININE 2.02* 10/10/2014   CREATININE 2.31* 10/09/2014   CREATININE 1.88* 09/27/2014   CALCIUM 7.8* 10/10/2014   CALCIUM 8.3* 10/09/2014   CALCIUM 8.4* 09/27/2014   LFT  Recent Labs  10/09/14 2237 10/10/14 0529  PROT 5.8* 5.0*  ALBUMIN 2.4* 2.1*  AST 81* 75*  ALT 22 21  ALKPHOS 90 82  BILITOT 1.2 2.6*   PT/INR Lab Results  Component Value Date   INR 1.51* 09/10/2014   INR 1.66* 09/09/2014   INR 2.45* 08/31/2014  Hepatitis Panel No results for input(s): HEPBSAG, HCVAB, HEPAIGM, HEPBIGM in the last 72 hours. C-Diff No components found for: CDIFF Lipase  No results found for: LIPASE  Drugs of Abuse  No results found for: LABOPIA, COCAINSCRNUR, LABBENZ, AMPHETMU, THCU, LABBARB   RADIOLOGY STUDIES: Dg Chest 2 View  10/09/2014   CLINICAL DATA:  Weakness, shortness of breath.  History of COPD.  EXAM: CHEST  2 VIEW  COMPARISON:  09/11/2014  FINDINGS: Lungs are hyperinflated with emphysema. There are bilateral calcified pleural plaques. Dual lead left-sided pacemaker remains in place. Patient is post median sternotomy. Stable cardiomegaly. No pulmonary edema. Improved right upper lobe opacity compared to prior. Increased opacity at the right costophrenic angle may reflect new focal  consolidation, atelectasis, or less likely pleural effusion. No left pleural effusion. No pneumothorax. No acute osseous abnormalities are seen.  IMPRESSION: Increasing opacity at the right costophrenic angle that is new/ progressed from prior exams and may reflect atelectasis, pneumonia or less likely pleural effusion.  Underlying chronic lung disease, bilateral pleural plaques and emphysema. Stable cardiomegaly.   Electronically Signed   By: Jeb Levering M.D.   On: 10/09/2014 23:23   Dg Thoracic Spine W/swimmers  10/09/2014   CLINICAL DATA:  Acute onset of chronic upper back pain. Initial encounter.  EXAM: THORACIC SPINE - 2 VIEW + SWIMMERS  COMPARISON:  Chest radiograph performed 09/11/2014  FINDINGS: There is no evidence of fracture or subluxation. Vertebral bodies demonstrate normal height and alignment. Mild degenerative change is noted along the cervical spine.  The visualized portions of both lungs are clear. The mediastinum is borderline normal in size. The patient is status post median sternotomy, with evidence of prior CABG. AICD leads are noted.  IMPRESSION: No evidence of fracture or subluxation along the cervical spine.   Electronically Signed   By: Garald Balding M.D.   On: 10/09/2014 23:22   Dg Lumbar Spine 2-3 Views  10/09/2014   CLINICAL DATA:  Chronic lower back pain.  Initial encounter.  EXAM: LUMBAR SPINE - 2-3 VIEW  COMPARISON:  CT of the abdomen and pelvis performed 12/07/2012  FINDINGS: There is no evidence of acute fracture or subluxation. Vertebral bodies demonstrate normal height. There is grade 1 anterolisthesis of L3 on L4. Mild disc space narrowing is noted at L2-L3 and L5-S1. Associated endplate sclerotic change is noted.  The visualized bowel gas pattern is unremarkable in appearance; air and stool are noted within the colon. The sacroiliac joints are within normal limits. Scattered vascular calcifications are seen.  IMPRESSION: 1. No evidence of acute fracture or  subluxation along the lumbar spine. 2. Mild degenerative change noted along the lumbar spine. 3. Scattered vascular calcifications seen.   Electronically Signed   By: Garald Balding M.D.   On: 10/09/2014 23:24   Ct Head Wo Contrast  10/09/2014   CLINICAL DATA:  Acute onset of generalized weakness. Back pain. Frequent falls. Worsening confusion. Initial encounter.  EXAM: CT HEAD WITHOUT CONTRAST  TECHNIQUE: Contiguous axial images were obtained from the base of the skull through the vertex without intravenous contrast.  COMPARISON:  CT of the neck performed 03/11/2007  FINDINGS: There is no evidence of acute infarction, mass lesion, or intra- or extra-axial hemorrhage on CT.  Prominence of the sulci suggests mild cortical volume loss. Mild cerebellar atrophy is noted. Mild periventricular white matter change likely reflects small vessel ischemic microangiopathy.  The brainstem and fourth ventricle are within normal limits. The third and lateral ventricles, and basal ganglia  are unremarkable in appearance. The cerebral hemispheres are symmetric in appearance, with normal gray-white differentiation. No mass effect or midline shift is seen.  There is no evidence of fracture; visualized osseous structures are unremarkable in appearance. The orbits are within normal limits. The paranasal sinuses and mastoid air cells are well-aerated. No significant soft tissue abnormalities are seen.  IMPRESSION: 1. No acute intracranial pathology seen on CT. 2. Mild cortical volume loss and scattered small vessel ischemic microangiopathy.   Electronically Signed   By: Garald Balding M.D.   On: 10/09/2014 23:46    ENDOSCOPIC STUDIES: Per HPI.  IMPRESSION:   *  Recurrent anemia. FOBT positive.  Virtual colonoscopy in 2014 raising question of sigmoid carcinoma. Patient declined suggested colonoscopy.  GI specialist at the time felt sigmoid changes may be sequela of previous diverticulitis. Melena and anemia in 08/2014.  EGD then  showed retained gastric contents and Candida esophagitis, treated with course of oral Diflucan. On chronic Protonix.   *  Question diabetic gastroparesis. Retained gastric contents noted on EGD last month.  *  Chronic anticoagulation with Eliquis for history of atrial flutter.  This is on hold.  Has not received this for the past 4 days.  *  Ischemic CM. Chronic systolic heart failure. Severe pulmonary hypertension. Moderate to severe mitral regurgitation. Status post ICD. CAD. Status post CABG. Cardiology feels that he has some ongoing volume overload and will benefit from diuresis.  *  CKD, stage 3.  This along with chronic medical problems also contributing to his anemia.  *  Hypokalemia  *  COPD. Severe. Possible recurrent, never resolved pneumonia. On cefepime and vancomycin.  *  IDDM.    *  Hypoalbuminemia. By appearance he looks to have protein/calorie malnutrition.   PLAN:     *   If safe to proceed, patient should either undergo flexible sigmoidoscopy or colonoscopy.. Would appreciate the heart failure team's input as to readiness for the studies which would be done with MAC sedation.   *  2 units PRBCs have been ordered.  *  Since there are no imminent plans for endoscopic evaluation, have advanced his diet to a heart healthy/carb mod diet.  *  PO Protonix.    Azucena Freed  10/10/2014, 1:48 PM Pager: [phone]  GI ATTENDING  History, laboratories, x-rays reviewed. Prior endoscopy reports reviewed. Patient personally seen and examined. Agree with H&P as outlined above. Very complicated and difficult case given his comorbidities and the conundrum regarding his need for anticoagulation therapy and recurrent GI bleeding. Recent upper endoscopy was unrevealing. Prior attempted colonoscopy was unsuccessful due to sigmoid stenosis. Virtual colonoscopy 2 years ago showed stenosis in the sigmoid area raising the question of malignancy but felt to be benign by his primary GI  ([facility]off). No other significant lesions in the colon. Recent history was that of melena on Eliquis. Now with recurrent anemia and heme positive stools without acute bleeding. His anemia certainly multifactorial. In terms of his GI bleeding, I do suspect small bowel pathology such as small bowel AVMs or possibly nonspecific mucosal oozing in the face of anticoagulation therapy as the most likely cause. I am extremely reluctant, as is the patient, to consider colonoscopy given his previously documented anatomy as well as his inability to tolerate a procedure-related complication. As such, I would recommend proceeding with capsule endoscopy to interrogate the small bowel. There is a small chance that the capsule could be retained at the level of the sigmoid colon, but hopefully not.  Depending upon the findings, consideration of discontinuing anticoagulation is possible. Certainly the risks of that are clearly understood. Patient has an excellent grasp of already above issues as outlined wants to proceed with capsule endoscopy. We will arrange as inpatient. In the interim transfuse as needed. Furthermore, without active bleeding, it could resume Eliquis now. This may actually be helpful when he has is capsule endoscopy. Will follow. Thank you  Docia Chuck. Geri Seminole., M.D. [facility] Division of Gastroenterology

## 2014-10-10 NOTE — Progress Notes (Signed)
ANTIBIOTIC CONSULT NOTE - INITIAL  Pharmacy Consult for Vancomycin/Cefepime  Indication: rule out pneumonia  Allergies  Allergen Reactions  . Niacin Itching    Niaspan     Patient Measurements: Height: 5\' 7"  (170.2 cm) Weight: 145 lb 15.1 oz (66.2 kg) IBW/kg (Calculated) : 66.1  Vital Signs: Temp: 97.8 F (36.6 C) (05/17 0223) Temp Source: Oral (05/17 0223) BP: 119/39 mmHg (05/17 0230) Pulse Rate: 57 (05/17 0230) Intake/Output from previous day: 05/16 0701 - 05/17 0700 In: 630 [Blood:180; IV Piggyback:450] Out: -  Intake/Output from this shift: Total I/O In: 630 [Blood:180; IV Piggyback:450] Out: -   Labs:  Recent Labs  10/09/14 2237  WBC 9.8  HGB 7.2*  PLT 125*  CREATININE 2.31*   Estimated Creatinine Clearance: 25 mL/min (by C-G formula based on Cr of 2.31). No results for input(s): VANCOTROUGH, VANCOPEAK, VANCORANDOM, GENTTROUGH, GENTPEAK, GENTRANDOM, TOBRATROUGH, TOBRAPEAK, TOBRARND, AMIKACINPEAK, AMIKACINTROU, AMIKACIN in the last 72 hours.   Microbiology: Recent Results (from the past 720 hour(s))  Clostridium Difficile by PCR     Status: None   Collection Time: 09/11/14  5:23 AM  Result Value Ref Range Status   C difficile by pcr NEGATIVE NEGATIVE Final  MRSA PCR Screening     Status: None   Collection Time: 10/10/14  1:49 AM  Result Value Ref Range Status   MRSA by PCR NEGATIVE NEGATIVE Final    Comment:        The GeneXpert MRSA Assay (FDA approved for NASAL specimens only), is one component of a comprehensive MRSA colonization surveillance program. It is not intended to diagnose MRSA infection nor to guide or monitor treatment for MRSA infections.     Medical History: Past Medical History  Diagnosis Date  . VENTRICULAR TACHYCARDIA   . PERIPHERAL VASCULAR DISEASE   . HYPERTENSION     Hypertensive retinopathy-grade II OU  . HYPERCHOLESTEROLEMIA   . CORONARY ARTERY DISEASE     Hx of CABG  . Chronic systolic heart failure    Ischemic cardiomyopathy (01/2014 EF 40-45% with hypokinesis + small area of akinesis  . SEBORRHEIC DERMATITIS   . Chronic renal insufficiency, stage III (moderate)     CrCl 40s-50s  . DIVERTICULOSIS OF COLON   . COPD, severe   . COLONIC POLYPS   . ANXIETY   . ICD (implantable cardiac defibrillator) in place   . Type II diabetes mellitus     +background diabetic retinopathy OU  . Chronic lower back pain   . DJD (degenerative joint disease)     "hands" (05/24/2012)  . Osteoarthritis of finger   . History of atrial flutter   . Thrombus 12/2013    on ICD lead--started anticoag and his cardioversion for atrial flutter was postponed.  . Colon stricture 2015    with features worrisome for mass, also with recent rising CEA level (possible colon cancer)-GI MD= Dr. Franki Cabot recent eval/follow up with Dr. Earlean Shawl was summer 2014, at which time he recommended colonoscopy but pt declined.  . Macular degeneration, age related, nonexudative     OU    Assessment: Here for increased confusion/falls, CXR concerning for PNA, WBC WNL, Scr 2.3 with CrCl <30, lactic acid 2.37, other labs as above.   Goal of Therapy:  Vancomycin trough level 15-20 mcg/ml  Plan:  -Vancomycin 750 mg IV q24h -Cefepime 1g IV q24h -Trend WBC, temp, renal function  -Drug levels as indicated   Narda Bonds 10/10/2014,3:26 AM

## 2014-10-10 NOTE — Progress Notes (Signed)
SLP Cancellation Note  Patient Details Name: [name] MRN: [id number] DOB: Apr 23, 1936   Cancelled treatment:       Reason Eval/Treat Not Completed: Medical issues which prohibited therapy. Pt is NPO awaiting GI consult. Will f/u with swallow eval as able.    Avacyn Kloosterman, Katherene Ponto 10/10/2014, 11:38 AM

## 2014-10-10 NOTE — Progress Notes (Signed)
Patient seen and examined history of CAD status post CABG, chronic atrial fibrillation on Apixaban, ischemic cardiomyopathy, diabetes mellitus, chronic kidney disease, COPD presents to the ER because of increasing weakness and fatigue.  Found to have lactic acidosis, hyponatremia, hypokalemia, hypomagnesemia, anemia, FOBT positive   Subjective Complaining of generalized weakness  Plan 1. GI bleed with severe anemia causing weakness -patient is to receive 2 units of packed red blood cell transfusion  . Since patient has been having recurrent bleeding and during patient's last bleeding also patient Apixaban was placed on hold. Holding Apixaban for now , awaiting further recommendations from cardiology, GI. Will keep the patient nothing by mouth and continue Protonix. Consulted Dr. Benson Norway, gastroenterologist. Most recent EGD on 4/10 showed Candida esophagitis. Status post 10 days of by mouth fluconazole. Cardiology has been consulted. Await cardiology clearance if the patient needs another EGD this admission. 2. Chronic systolic heart failure with AICD placement last EF measured as 40-45% - hold torsemide given worsening hyponatremia since 4/27. Cardiology consulted for further recommendations, on metolazone on Monday and Friday. Closely follow intake output and metabolic panel. Caution that patient is receiving blood transfusion at this time. 3. Atrial fibrillation - presently holding off Apixaban due to GI bleed after discussing with patient about the risks of holding Apixaban. Patient is agreeable. Rate is presently controlled. 4. Pneumonia - patient has possible pneumonia for which patient has been placed on vancomycin and cefepime. 5. Chronic kidney disease stage III - closely follow intake and output and metabolic panel. 6. Diabetes mellitus type 2 - on insulin. Closely follow CBGs. 7. Upper Back Pain - if persists will need CT spine 8. Multiple falls, could be secondary to all of the above, PT/OT  evaluation, continue in step down

## 2014-10-10 NOTE — ED Notes (Signed)
Blood consent signed

## 2014-10-10 NOTE — ED Notes (Signed)
Unable to start blood due to lack of iv access, floor RN aware and IV team consult placed Rn on floor will initiate blood.

## 2014-10-10 NOTE — H&P (Addendum)
[facility] History and Physical  [name] XTK:[id number] DOB: [birth year] DOA: 10/09/2014  Referring physician: Dr. Sherry Ruffing. PCP: Tammi Sou, MD  Specialists: Dr. Tempie Hoist. Cardiologist.  Chief Complaint: Weakness.  HPI: [name] is a 79 y.o. male with history of CAD status post CABG, chronic atrial fibrillation on Apixaban, ischemic cardiomyopathy, diabetes mellitus, chronic kidney disease, COPD presents to the ER because of increasing weakness and fatigue. Patient states he has been feeling weak and fatigue over the last couple of weeks and has had recurrent falls. Patient states he has hit his head but denies losing consciousness. Denies any chest pain or shortness of breath. In the ER patient also complained of upper back pain. CT head and x-ray spine was unremarkable. Patient's hemoglobin is around 7 which is a drop of 3 g from previous from recent discharge. Patient was recently admitted for GI bleed and also pneumonia. During the admission for GI bleed patient had EGD which showed Candida esophagitis. Patient at this time his stool for occult blood positive but patient states he has not noticed any frank bleeding or black stools. Patient has been I some productive cough and chest x-ray shows possible pneumonia for which patient has been started on empiric antibiotics.   Review of Systems: As presented in the history of presenting illness, rest negative.  Past Medical History  Diagnosis Date  . VENTRICULAR TACHYCARDIA   . PERIPHERAL VASCULAR DISEASE   . HYPERTENSION     Hypertensive retinopathy-grade II OU  . HYPERCHOLESTEROLEMIA   . CORONARY ARTERY DISEASE     Hx of CABG  . Chronic systolic heart failure     Ischemic cardiomyopathy (01/2014 EF 40-45% with hypokinesis + small area of akinesis  . SEBORRHEIC DERMATITIS   . Chronic renal insufficiency, stage III (moderate)     CrCl 40s-50s  . DIVERTICULOSIS OF COLON   . COPD, severe   . COLONIC POLYPS    . ANXIETY   . ICD (implantable cardiac defibrillator) in place   . Type II diabetes mellitus     +background diabetic retinopathy OU  . Chronic lower back pain   . DJD (degenerative joint disease)     "hands" (05/24/2012)  . Osteoarthritis of finger   . History of atrial flutter   . Thrombus 12/2013    on ICD lead--started anticoag and his cardioversion for atrial flutter was postponed.  . Colon stricture 2015    with features worrisome for mass, also with recent rising CEA level (possible colon cancer)-GI MD= Dr. Franki Cabot recent eval/follow up with Dr. Earlean Shawl was summer 2014, at which time he recommended colonoscopy but pt declined.  . Macular degeneration, age related, nonexudative     OU   Past Surgical History  Procedure Laterality Date  . Coronary artery bypass graft  1990's    CABG X4  . Tonsillectomy  ?1942  . Cardiac defibrillator placement  12/2004    single chamber defibrillator- [employer]onic Maxima 8/06 DrKlein [Other][  . Tee without cardioversion N/A 12/22/2013    Procedure: TRANSESOPHAGEAL ECHOCARDIOGRAM (TEE);  Surgeon: Thayer Headings, MD;  Location: [facility];  Service: Cardiovascular;  Laterality: N/A;  . Cardioversion N/A 12/22/2013    Procedure: CARDIOVERSION;  Surgeon: Thayer Headings, MD;  Location: [facility] ENDOSCOPY;  Service: Cardiovascular;  Laterality: N/A;  . Cardiovascular stress test  01/12/13    myocard perf imaging: previous infarct with a moderately reduced EF as was known previously.  No new findings.   . Gastric emptying scan  02/02/14    Normal  . Transthoracic echocardiogram  01/25/14; 04/2014    EF 40-45%, diffuse hypokinesis, infero-basilar myocardial akinesis, PA pressure slightly high, valves fine.04/2014-Echo today EF ~40% inferior AK moderate to severe MR  . Abdominal ultrasound  01/2014    GB sludge, o/w normal  . Ct virtual colonoscopy diagnostic  11/2012    Asymmetric thickening of the rectosigmoid colon worrisome for  . Cardiovascular stress  test      Lexiscan: There is a medium sized fixed inferior wall defect of moderate severity involving the apical inferior and mid inferior and basal inferoseptal segments, consistent with old inferior MI. No significant reversible ischemia.  EF 34%, inferior wall hypokinesis--intermediate risk scan (ok to do planned procedure)  . Implantable cardioverter defibrillator revision N/A 05/25/2012    Procedure: IMPLANTABLE CARDIOVERTER DEFIBRILLATOR REVISION;  Surgeon: Evans Lance, MD;  Location: [facility] CATH LAB;  Service: Cardiovascular;  Laterality: N/A;  . Right heart catheterization N/A 08/31/2014    Procedure: RIGHT HEART CATH;  Surgeon: Larey Dresser, MD;  Location: [facility] CATH LAB;  Service: Cardiovascular;  Laterality: N/A;  . Esophagogastroduodenoscopy N/A 09/03/2014    Procedure: ESOPHAGOGASTRODUODENOSCOPY (EGD);  Surgeon: Carol Ada, MD;  Location: [facility] ENDOSCOPY;  Service: Endoscopy;  Laterality: N/A;   Social History:  reports that he quit smoking about 14 years ago. His smoking use included Cigarettes. He has a 100 pack-year smoking history. He has never used smokeless tobacco. He reports that he drinks alcohol. He reports that he does not use illicit drugs. Where does patient live home. Can patient participate in ADLs? Yes.  Allergies  Allergen Reactions  . Niacin Itching    Niaspan     Family History:  Family History  Problem Relation Age of Onset  . Heart attack Father 72  . Heart attack Brother     Vague history      Prior to Admission medications   Medication Sig Start Date End Date Taking? Authorizing Provider  amiodarone (PACERONE) 200 MG tablet Take 1 tablet (200 mg total) by mouth daily. 09/14/14   Allie Bossier, MD  apixaban (ELIQUIS) 5 MG TABS tablet Take 1 tablet (5 mg total) by mouth 2 (two) times daily. 09/14/14   Allie Bossier, MD  carvedilol (COREG) 3.125 MG tablet Take 1 tablet (3.125 mg total) by mouth 2 (two) times daily with a meal. 09/14/14   Allie Bossier, MD   Fluticasone-Salmeterol (ADVAIR) 500-50 MCG/DOSE AEPB Inhale 1 puff into the lungs 2 (two) times daily. 04/05/14   Noralee Space, MD  folic acid (FOLVITE) 1 MG tablet Take 1 tablet (1 mg total) by mouth daily. 09/14/14   Allie Bossier, MD  hydrALAZINE (APRESOLINE) 25 MG tablet Take 25 mg by mouth 3 (three) times daily.    Historical Provider, MD  Insulin Glargine (LANTUS SOLOSTAR) 100 UNIT/ML Solostar Pen 10 units SQ qhs Patient taking differently: Inject 10 Units into the skin at bedtime.  02/10/14   Tammi Sou, MD  insulin lispro (HUMALOG KWIKPEN) 100 UNIT/ML KiwkPen Take 7 units in the morning, 8 units at noon, and 9 units at bedtime. 10/02/14   Tammi Sou, MD  isosorbide mononitrate (IMDUR) 30 MG 24 hr tablet Take 30 mg by mouth daily.    Historical Provider, MD  isosorbide mononitrate (IMDUR) 30 MG 24 hr tablet TAKE ONE TABLET BY MOUTH DAILY 09/26/14   Amy D Clegg, NP  levofloxacin (LEVAQUIN) 750 MG tablet Take 1 tablet (750 mg total)  by mouth every other day. 09/14/14   Allie Bossier, MD  levothyroxine (SYNTHROID, LEVOTHROID) 75 MCG tablet Take 1 tablet (75 mcg total) by mouth daily. 09/18/14   Tammi Sou, MD  metolazone (ZAROXOLYN) 2.5 MG tablet Take on mondays and Fridays 09/27/14   Jolaine Artist, MD  pantoprazole (PROTONIX) 40 MG tablet Take 1 tablet (40 mg total) by mouth 2 (two) times daily. 09/05/14   Amy D Clegg, NP  potassium chloride SA (KLOR-CON M20) 20 MEQ tablet Take 1 tablet (20 mEq total) by mouth 2 (two) times daily. 09/27/14   Jolaine Artist, MD  SPIRIVA HANDIHALER 18 MCG inhalation capsule PLACE ONE CAPSULE INTO INHALER AND  INHALE  DAILY 09/18/14   Noralee Space, MD  thiamine 100 MG tablet Take 1 tablet (100 mg total) by mouth daily. 09/14/14   Allie Bossier, MD  tiotropium (SPIRIVA) 18 MCG inhalation capsule Place 18 mcg into inhaler and inhale every evening.     Historical Provider, MD  tobramycin (TOBREX) 0.3 % ophthalmic solution Place 2 drops into both  eyes every 6 (six) hours. 09/14/14   Allie Bossier, MD  torsemide (DEMADEX) 20 MG tablet Take 3 tablets (60 mg total) by mouth daily. 09/27/14   Jolaine Artist, MD    Physical Exam: Filed Vitals:   10/10/14 4098 10/10/14 0215 10/10/14 0223 10/10/14 0230  BP: 123/44 121/40  119/39  Pulse: 62 60 62 57  Temp: 97.8 F (36.6 C)  97.8 F (36.6 C)   TempSrc: Oral  Oral   Resp:  14 20 15   Height:      Weight:      SpO2: 100% 99% 99% 90%     General:  Moderately built and nourished.  Eyes: Anicteric no pallor.  ENT: No discharge from the ears eyes nose and mouth.  Neck: No mass felt.  Cardiovascular: S1 and S2 heard.  Respiratory: No rhonchi or crepitations.  Abdomen: Soft nontender bowel sounds present.  Skin: Multiple bruises and there is some skin tear of the right elbow.  Musculoskeletal: No edema.  Psychiatric: Appears normal.  Neurologic: Alert awake oriented to time place and person. Moves all extremities.  Labs on Admission:  Basic Metabolic Panel:  Recent Labs Lab 10/09/14 2237  NA 125*  K 2.6*  CL 78*  CO2 33*  GLUCOSE 99  BUN 54*  CREATININE 2.31*  CALCIUM 8.3*   Liver Function Tests:  Recent Labs Lab 10/09/14 2237  AST 81*  ALT 22  ALKPHOS 90  BILITOT 1.2  PROT 5.8*  ALBUMIN 2.4*   No results for input(s): LIPASE, AMYLASE in the last 168 hours. No results for input(s): AMMONIA in the last 168 hours. CBC:  Recent Labs Lab 10/09/14 2237  WBC 9.8  NEUTROABS 6.5  HGB 7.2*  HCT 20.8*  MCV 89.3  PLT 125*   Cardiac Enzymes: No results for input(s): CKTOTAL, CKMB, CKMBINDEX, TROPONINI in the last 168 hours.  BNP (last 3 results)  Recent Labs  08/30/14 1639 09/09/14 1150 10/09/14 2237  BNP 800.7* 550.7* 715.8*    ProBNP (last 3 results)  Recent Labs  01/24/14 1909 02/09/14 0945  PROBNP 6721.0* 2555.0*    CBG: No results for input(s): GLUCAP in the last 168 hours.  Radiological Exams on Admission: Dg Chest 2  View  10/09/2014   CLINICAL DATA:  Weakness, shortness of breath.  History of COPD.  EXAM: CHEST  2 VIEW  COMPARISON:  09/11/2014  FINDINGS: Lungs  are hyperinflated with emphysema. There are bilateral calcified pleural plaques. Dual lead left-sided pacemaker remains in place. Patient is post median sternotomy. Stable cardiomegaly. No pulmonary edema. Improved right upper lobe opacity compared to prior. Increased opacity at the right costophrenic angle may reflect new focal consolidation, atelectasis, or less likely pleural effusion. No left pleural effusion. No pneumothorax. No acute osseous abnormalities are seen.  IMPRESSION: Increasing opacity at the right costophrenic angle that is new/ progressed from prior exams and may reflect atelectasis, pneumonia or less likely pleural effusion.  Underlying chronic lung disease, bilateral pleural plaques and emphysema. Stable cardiomegaly.   Electronically Signed   By: Jeb Levering M.D.   On: 10/09/2014 23:23   Dg Thoracic Spine W/swimmers  10/09/2014   CLINICAL DATA:  Acute onset of chronic upper back pain. Initial encounter.  EXAM: THORACIC SPINE - 2 VIEW + SWIMMERS  COMPARISON:  Chest radiograph performed 09/11/2014  FINDINGS: There is no evidence of fracture or subluxation. Vertebral bodies demonstrate normal height and alignment. Mild degenerative change is noted along the cervical spine.  The visualized portions of both lungs are clear. The mediastinum is borderline normal in size. The patient is status post median sternotomy, with evidence of prior CABG. AICD leads are noted.  IMPRESSION: No evidence of fracture or subluxation along the cervical spine.   Electronically Signed   By: Garald Balding M.D.   On: 10/09/2014 23:22   Dg Lumbar Spine 2-3 Views  10/09/2014   CLINICAL DATA:  Chronic lower back pain.  Initial encounter.  EXAM: LUMBAR SPINE - 2-3 VIEW  COMPARISON:  CT of the abdomen and pelvis performed 12/07/2012  FINDINGS: There is no evidence of  acute fracture or subluxation. Vertebral bodies demonstrate normal height. There is grade 1 anterolisthesis of L3 on L4. Mild disc space narrowing is noted at L2-L3 and L5-S1. Associated endplate sclerotic change is noted.  The visualized bowel gas pattern is unremarkable in appearance; air and stool are noted within the colon. The sacroiliac joints are within normal limits. Scattered vascular calcifications are seen.  IMPRESSION: 1. No evidence of acute fracture or subluxation along the lumbar spine. 2. Mild degenerative change noted along the lumbar spine. 3. Scattered vascular calcifications seen.   Electronically Signed   By: Garald Balding M.D.   On: 10/09/2014 23:24   Ct Head Wo Contrast  10/09/2014   CLINICAL DATA:  Acute onset of generalized weakness. Back pain. Frequent falls. Worsening confusion. Initial encounter.  EXAM: CT HEAD WITHOUT CONTRAST  TECHNIQUE: Contiguous axial images were obtained from the base of the skull through the vertex without intravenous contrast.  COMPARISON:  CT of the neck performed 03/11/2007  FINDINGS: There is no evidence of acute infarction, mass lesion, or intra- or extra-axial hemorrhage on CT.  Prominence of the sulci suggests mild cortical volume loss. Mild cerebellar atrophy is noted. Mild periventricular white matter change likely reflects small vessel ischemic microangiopathy.  The brainstem and fourth ventricle are within normal limits. The third and lateral ventricles, and basal ganglia are unremarkable in appearance. The cerebral hemispheres are symmetric in appearance, with normal gray-white differentiation. No mass effect or midline shift is seen.  There is no evidence of fracture; visualized osseous structures are unremarkable in appearance. The orbits are within normal limits. The paranasal sinuses and mastoid air cells are well-aerated. No significant soft tissue abnormalities are seen.  IMPRESSION: 1. No acute intracranial pathology seen on CT. 2. Mild  cortical volume loss and scattered small vessel ischemic  microangiopathy.   Electronically Signed   By: Garald Balding M.D.   On: 10/09/2014 23:46    EKG: Independently reviewed. Sinus rhythm with IVCD.  Assessment/Plan Principal Problem:   GI bleed Active Problems:   Essential hypertension   COPD, severe   Chronic systolic CHF (congestive heart failure), NYHA class 4   CKD (chronic kidney disease) stage 3, GFR 30-59 ml/min   Anemia   Diabetes mellitus type 2, controlled   1. GI bleed with severe anemia causing weakness - at this time 2 units of packed red blood cell transfusion has been ordered. Since patient has been having recurrent bleeding and during patient's last bleeding also patient Apixaban was placed on hold. I have held patient's Apixaban for now and place patient on clear liquid diet and Protonix. Consult Dr. Benson Norway, gastroenterologist in a.m. as Dr. Benson Norway has seen patient last month. Follow CBC after transfusion. 2. Chronic systolic heart failure with AICD placement last EF measured as 40-45% - continue torsemide. Order metolazone on Monday and Friday. Closely follow intake output and metabolic panel. Caution that patient is receiving blood transfusion at this time. 3. Atrial fibrillation - presently holding off Apixaban due to GI bleed after discussing with patient about the risks of holding Apixaban. Patient is agreeable. Rate is presently controlled. 4. Pneumonia - patient has possible pneumonia for which patient has been placed on vancomycin and cefepime. 5. Chronic kidney disease stage III - closely follow intake and output and metabolic panel. 6. Diabetes mellitus type 2 - on insulin. Closely follow CBGs. 7. Upper Back Pain - if persists will need CT spine.   DVT Prophylaxis SCDs.  Code Status: Full code.  Family Communication: Discussed with patient.  Disposition Plan: Admit to inpatient.    Korri Ask N. [facility] Pager [phone].  If 7PM-7AM,  please contact night-coverage [email or web address] Password [email or web address] 10/10/2014, 3:15 AM

## 2014-10-10 NOTE — Care Management Note (Addendum)
Case Management Note  Patient Details  Name: [name] MRN: [id number] Date of Birth: 27-Aug-1935  Subjective/Objective:       Adm w gi bleed             Action/Plan:lives w wife, pcp dr Julien Nordmann, act w adv homecare last adm   Expected Discharge Date:                  Expected Discharge Plan:  Home/Self Care  In-House Referral:     Discharge planning Services     Post Acute Care Choice:    Choice offered to:     DME Arranged:    DME Agency:     HH Arranged:    [place] Agency:     Status of Service:     Medicare Important Message Given:    Date Medicare IM Given:    Medicare IM give by:    Date Additional Medicare IM Given:    Additional Medicare Important Message give by:     If discussed at [place] of Stay Meetings, dates discussed:    Additional Comments:ur ins review  Lacretia Leigh, RN 10/10/2014, 8:55 AM

## 2014-10-10 NOTE — ED Notes (Signed)
IV attempt x2, unsuccessful

## 2014-10-11 DIAGNOSIS — K2961 Other gastritis with bleeding: Secondary | ICD-10-CM

## 2014-10-11 DIAGNOSIS — R531 Weakness: Secondary | ICD-10-CM

## 2014-10-11 DIAGNOSIS — E119 Type 2 diabetes mellitus without complications: Secondary | ICD-10-CM

## 2014-10-11 DIAGNOSIS — J9611 Chronic respiratory failure with hypoxia: Secondary | ICD-10-CM | POA: Insufficient documentation

## 2014-10-11 DIAGNOSIS — J189 Pneumonia, unspecified organism: Secondary | ICD-10-CM

## 2014-10-11 LAB — COMPREHENSIVE METABOLIC PANEL
ALT: 21 U/L (ref 17–63)
ANION GAP: 8 (ref 5–15)
AST: 77 U/L — ABNORMAL HIGH (ref 15–41)
Albumin: 2.1 g/dL — ABNORMAL LOW (ref 3.5–5.0)
Alkaline Phosphatase: 84 U/L (ref 38–126)
BILIRUBIN TOTAL: 1.4 mg/dL — AB (ref 0.3–1.2)
BUN: 40 mg/dL — AB (ref 6–20)
CALCIUM: 8.1 mg/dL — AB (ref 8.9–10.3)
CO2: 29 mmol/L (ref 22–32)
CREATININE: 1.85 mg/dL — AB (ref 0.61–1.24)
Chloride: 89 mmol/L — ABNORMAL LOW (ref 101–111)
GFR calc non Af Amer: 33 mL/min — ABNORMAL LOW (ref >60)
GFR, EST AFRICAN AMERICAN: 39 mL/min — AB (ref >60)
GLUCOSE: 129 mg/dL — AB (ref 65–99)
Potassium: 4.4 mmol/L (ref 3.5–5.1)
Sodium: 126 mmol/L — ABNORMAL LOW (ref 135–145)
Total Protein: 5.3 g/dL — ABNORMAL LOW (ref 6.5–8.1)

## 2014-10-11 LAB — TYPE AND SCREEN
ABO/RH(D): O POS
ANTIBODY SCREEN: NEGATIVE
Unit division: 0

## 2014-10-11 LAB — CBC
HEMATOCRIT: 23.9 % — AB (ref 39.0–52.0)
Hemoglobin: 8 g/dL — ABNORMAL LOW (ref 13.0–17.0)
MCH: 30 pg (ref 26.0–34.0)
MCHC: 33.5 g/dL (ref 30.0–36.0)
MCV: 89.5 fL (ref 78.0–100.0)
Platelets: 137 10*3/uL — ABNORMAL LOW (ref 150–400)
RBC: 2.67 MIL/uL — ABNORMAL LOW (ref 4.22–5.81)
RDW: 18.4 % — AB (ref 11.5–15.5)
WBC: 10.5 10*3/uL (ref 4.0–10.5)

## 2014-10-11 LAB — GLUCOSE, CAPILLARY
GLUCOSE-CAPILLARY: 130 mg/dL — AB (ref 65–99)
GLUCOSE-CAPILLARY: 147 mg/dL — AB (ref 65–99)
GLUCOSE-CAPILLARY: 161 mg/dL — AB (ref 65–99)
Glucose-Capillary: 108 mg/dL — ABNORMAL HIGH (ref 65–99)
Glucose-Capillary: 180 mg/dL — ABNORMAL HIGH (ref 65–99)

## 2014-10-11 MED ORDER — APIXABAN 5 MG PO TABS
5.0000 mg | ORAL_TABLET | Freq: Two times a day (BID) | ORAL | Status: DC
  Filled 2014-10-11 (×2): qty 1

## 2014-10-11 MED ORDER — BISACODYL 5 MG PO TBEC
5.0000 mg | DELAYED_RELEASE_TABLET | Freq: Once | ORAL | Status: AC
  Administered 2014-10-11: 5 mg via ORAL
  Filled 2014-10-11: qty 1

## 2014-10-11 MED ORDER — GUAIFENESIN ER 600 MG PO TB12
600.0000 mg | ORAL_TABLET | Freq: Two times a day (BID) | ORAL | Status: DC
  Administered 2014-10-11 – 2014-10-13 (×4): 600 mg via ORAL
  Filled 2014-10-11 (×5): qty 1

## 2014-10-11 MED ORDER — APIXABAN 2.5 MG PO TABS
2.5000 mg | ORAL_TABLET | Freq: Two times a day (BID) | ORAL | Status: DC
  Administered 2014-10-11 – 2014-10-13 (×5): 2.5 mg via ORAL
  Filled 2014-10-11 (×6): qty 1

## 2014-10-11 MED ORDER — TORSEMIDE 20 MG PO TABS
40.0000 mg | ORAL_TABLET | Freq: Every day | ORAL | Status: DC
  Administered 2014-10-11 – 2014-10-13 (×3): 40 mg via ORAL
  Filled 2014-10-11 (×3): qty 2

## 2014-10-11 MED ORDER — POLYETHYLENE GLYCOL 3350 17 G PO PACK
17.0000 g | PACK | Freq: Once | ORAL | Status: AC
  Administered 2014-10-11: 17 g via ORAL

## 2014-10-11 MED ORDER — BUDESONIDE 0.25 MG/2ML IN SUSP
0.2500 mg | Freq: Two times a day (BID) | RESPIRATORY_TRACT | Status: DC
  Administered 2014-10-11 – 2014-10-13 (×4): 0.25 mg via RESPIRATORY_TRACT
  Filled 2014-10-11 (×6): qty 2

## 2014-10-11 MED ORDER — POLYETHYLENE GLYCOL 3350 17 G PO PACK
17.0000 g | PACK | Freq: Once | ORAL | Status: AC
  Administered 2014-10-11: 17 g via ORAL
  Filled 2014-10-11: qty 1

## 2014-10-11 NOTE — Progress Notes (Signed)
Daily Rounding Note  10/11/2014, 8:14 AM  LOS: 1 day   SUBJECTIVE:       Feeling ok.  Not acutely SOB, coughing but not bringing up sputum Passed his bedside swallow eval.   OBJECTIVE:         Vital signs in last 24 hours:    Temp:  [97.3 F (36.3 C)-98.2 F (36.8 C)] 98 F (36.7 C) (05/18 0740) Pulse Rate:  [51-57] 56 (05/18 0600) Resp:  [10-18] 13 (05/18 0740) BP: (91-113)/(29-50) 111/38 mmHg (05/18 0740) SpO2:  [96 %-100 %] 100 % (05/18 0740) Weight:  [146 lb 3.2 oz (66.316 kg)] 146 lb 3.2 oz (66.316 kg) (05/18 0500) Last BM Date: 10/09/14 Filed Weights   10/09/14 2206 10/10/14 0200 10/11/14 0500  Weight: 145 lb (65.772 kg) 145 lb 15.1 oz (66.2 kg) 146 lb 3.2 oz (66.316 kg)   General: pleasant, chronically ill and frail looking   Heart: RRR Chest: clear in front Abdomen: soft, NT, active BS.  NT  Extremities: Bil LE edema Neuro/Psych:  Aleert/Oriented x 3.  Moves all 4 limbs.  No tremor.    Intake/Output from previous day: 05/17 0701 - 05/18 0700 In: 420 [P.O.:220; IV Piggyback:200] Out: 1975 [Urine:1975]  Intake/Output this shift:    Lab Results:  Recent Labs  10/09/14 2237 10/10/14 0529 10/11/14 0238  WBC 9.8 12.6* 10.5  HGB 7.2* 8.1* 8.0*  HCT 20.8* 23.4* 23.9*  PLT 125* 122* 137*   BMET  Recent Labs  10/09/14 2237 10/10/14 0529 10/11/14 0238  NA 125* 126* 126*  K 2.6* 2.9* 4.4  CL 78* 86* 89*  CO2 33* 32 29  GLUCOSE 99 123* 129*  BUN 54* 48* 40*  CREATININE 2.31* 2.02* 1.85*  CALCIUM 8.3* 7.8* 8.1*   LFT  Recent Labs  10/09/14 2237 10/10/14 0529 10/11/14 0238  PROT 5.8* 5.0* 5.3*  ALBUMIN 2.4* 2.1* 2.1*  AST 81* 75* 77*  ALT 22 21 21   ALKPHOS 90 82 84  BILITOT 1.2 2.6* 1.4*   Studies/Results: Dg Chest 2 View  10/09/2014   CLINICAL DATA:  Weakness, shortness of breath.  History of COPD.  EXAM: CHEST  2 VIEW  COMPARISON:  09/11/2014  FINDINGS: Lungs are hyperinflated  with emphysema. There are bilateral calcified pleural plaques. Dual lead left-sided pacemaker remains in place. Patient is post median sternotomy. Stable cardiomegaly. No pulmonary edema. Improved right upper lobe opacity compared to prior. Increased opacity at the right costophrenic angle may reflect new focal consolidation, atelectasis, or less likely pleural effusion. No left pleural effusion. No pneumothorax. No acute osseous abnormalities are seen.  IMPRESSION: Increasing opacity at the right costophrenic angle that is new/ progressed from prior exams and may reflect atelectasis, pneumonia or less likely pleural effusion.  Underlying chronic lung disease, bilateral pleural plaques and emphysema. Stable cardiomegaly.   Electronically Signed   By: Jeb Levering M.D.   On: 10/09/2014 23:23   Dg Thoracic Spine W/swimmers  10/09/2014   CLINICAL DATA:  Acute onset of chronic upper back pain. Initial encounter.  EXAM: THORACIC SPINE - 2 VIEW + SWIMMERS  COMPARISON:  Chest radiograph performed 09/11/2014  FINDINGS: There is no evidence of fracture or subluxation. Vertebral bodies demonstrate normal height and alignment. Mild degenerative change is noted along the cervical spine.  The visualized portions of both lungs are clear. The mediastinum is borderline normal in size. The patient is status post median sternotomy, with evidence of prior CABG.  AICD leads are noted.  IMPRESSION: No evidence of fracture or subluxation along the cervical spine.   Electronically Signed   By: Garald Balding M.D.   On: 10/09/2014 23:22   Dg Lumbar Spine 2-3 Views  10/09/2014   CLINICAL DATA:  Chronic lower back pain.  Initial encounter.  EXAM: LUMBAR SPINE - 2-3 VIEW  COMPARISON:  CT of the abdomen and pelvis performed 12/07/2012  FINDINGS: There is no evidence of acute fracture or subluxation. Vertebral bodies demonstrate normal height. There is grade 1 anterolisthesis of L3 on L4. Mild disc space narrowing is noted at L2-L3 and  L5-S1. Associated endplate sclerotic change is noted.  The visualized bowel gas pattern is unremarkable in appearance; air and stool are noted within the colon. The sacroiliac joints are within normal limits. Scattered vascular calcifications are seen.  IMPRESSION: 1. No evidence of acute fracture or subluxation along the lumbar spine. 2. Mild degenerative change noted along the lumbar spine. 3. Scattered vascular calcifications seen.   Electronically Signed   By: Garald Balding M.D.   On: 10/09/2014 23:24   Ct Head Wo Contrast  10/09/2014   CLINICAL DATA:  Acute onset of generalized weakness. Back pain. Frequent falls. Worsening confusion. Initial encounter.  EXAM: CT HEAD WITHOUT CONTRAST  TECHNIQUE: Contiguous axial images were obtained from the base of the skull through the vertex without intravenous contrast.  COMPARISON:  CT of the neck performed 03/11/2007  FINDINGS: There is no evidence of acute infarction, mass lesion, or intra- or extra-axial hemorrhage on CT.  Prominence of the sulci suggests mild cortical volume loss. Mild cerebellar atrophy is noted. Mild periventricular white matter change likely reflects small vessel ischemic microangiopathy.  The brainstem and fourth ventricle are within normal limits. The third and lateral ventricles, and basal ganglia are unremarkable in appearance. The cerebral hemispheres are symmetric in appearance, with normal gray-white differentiation. No mass effect or midline shift is seen.  There is no evidence of fracture; visualized osseous structures are unremarkable in appearance. The orbits are within normal limits. The paranasal sinuses and mastoid air cells are well-aerated. No significant soft tissue abnormalities are seen.  IMPRESSION: 1. No acute intracranial pathology seen on CT. 2. Mild cortical volume loss and scattered small vessel ischemic microangiopathy.   Electronically Signed   By: Garald Balding M.D.   On: 10/09/2014 23:46   Scheduled Meds: .  amiodarone  200 mg Oral Daily  . bisacodyl  5 mg Oral Once  . carvedilol  3.125 mg Oral BID WC  . ceFEPime (MAXIPIME) IV  1 g Intravenous Q24H  . folic acid  1 mg Oral Daily  . hydrALAZINE  25 mg Oral TID  . insulin aspart  0-9 Units Subcutaneous TID WC  . insulin glargine  10 Units Subcutaneous QHS  . isosorbide mononitrate  30 mg Oral Daily  . levothyroxine  75 mcg Oral QAC breakfast  . mometasone-formoterol  2 puff Inhalation BID  . pantoprazole  40 mg Oral Q0600  . polyethylene glycol  17 g Oral Once   Followed by  . polyethylene glycol  17 g Oral Once  . potassium chloride  40 mEq Oral TID  . sodium chloride  3 mL Intravenous Q12H  . thiamine  100 mg Oral Daily  . tiotropium  18 mcg Inhalation Daily  . torsemide  40 mg Oral Daily  . vancomycin  750 mg Intravenous Q24H   Continuous Infusions:  PRN Meds:.acetaminophen **OR** acetaminophen, ondansetron **OR** ondansetron (ZOFRAN) IV  ASSESMENT:   * Recurrent anemia. FOBT positive.s/p PRBC x 1.  Virtual colonoscopy in 2014 raising question of sigmoid carcinoma. Patient declined suggested colonoscopy. GI specialist at the time felt sigmoid changes may be sequela of previous diverticulitis. Melena and transfusion requiring anemia in 08/2014. EGD then: retained gastric contents, Candida esophagitis, treated with course of oral Diflucan. On chronic Protonix.   * Question diabetic gastroparesis. Retained gastric contents noted on EGD last month.  * Chronic anticoagulation with Eliquis for history of atrial flutter. This is on hold. Has not received this for the past 5 days.  *  Thrombocytopenia.  Improved.   * Ischemic CM. Chronic systolic heart failure. Severe pulmonary hypertension. Moderate to severe mitral regurgitation. Status post ICD. CAD. Status post CABG.  * CKD, stage 3. This along with chronic medical problems also contributing to his anemia.  * Hypokalemia  * COPD. Severe. Possible recurrent, never  resolved pneumonia. On cefepime and vancomycin.  * IDDM.   * Hypoalbuminemia. By appearance he looks to have protein/calorie malnutrition. On carb mod diet.    PLAN   *  Set for inpt capsule endo tomorrow AM.  Give dulcolax and 2 doses Miralax today for "bowel prep".     [name]  10/11/2014, 8:14 AM Pager: [phone]  GI ATTENDING  Interval history data reviewed. Case discussed with Dr. Haroldine Laws. Agree with interval progress note as outlined above. Patient seen and examined. Sleeping this afternoon. Lower dose Eliquis resume. Capsule endoscopy tomorrow.  Docia Chuck. Geri [place]in[place]., M.D. [facility] Division of Gastroenterology

## 2014-10-11 NOTE — Evaluation (Signed)
Physical Therapy Evaluation Patient Details Name: [name] MRN: [id number] DOB: Mar 12, 1936 Today's Date: 10/11/2014   History of Present Illness  [name] is a 79 y.o. male with history of CAD status post CABG, chronic A fib, ischemic cardiomyopathy, DM, CKD, COPD presents to the ER because of increasing weakness and fatigue and frequent falls. CT head and x-ray spine was unremarkable (pt with upper back pain). Patient's hemoglobin is around 7 which is a drop of 3 g from previous from recent discharge. Patient was recently admitted for GI bleed and also pneumonia.   Clinical Impression  Pt admitted with above diagnosis. Pt currently with functional limitations due to the deficits listed below (see PT Problem List). [name] functional status has declined significantly since evaluation by PT during admission in 4/16. Pt requiring mod A for mobility, +2 for safety and has strength impairments and balance deficits.  Pt will benefit from skilled PT to increase their independence and safety with mobility to allow discharge to the venue listed below.       Follow Up Recommendations CIR;Supervision/Assistance - 24 hour    Equipment Recommendations  None recommended by PT    Recommendations for Other Services Rehab consult     Precautions / Restrictions Precautions Precautions: Fall Precaution Comments: pt has fallen several times since last admission, says his legs just gave out. All reports he did not have his RW with him, though questionable historian Restrictions Weight Bearing Restrictions: No      Mobility  Bed Mobility Overal bed mobility: Needs Assistance Bed Mobility: Supine to Sit     Supine to sit: Mod assist     General bed mobility comments: needed verbal and tactile cues to initiate mvmt and then required mod A for legs off bed and trunk elevation  Transfers Overall transfer level: Needs assistance Equipment used: Rolling walker (2 wheeled) Transfers:  Sit to/from [facility] Sit to Stand: +2 physical assistance;Mod assist;Min assist;+2 safety/equipment Stand pivot transfers: Min assist;+2 physical assistance       General transfer comment: first sit to stand, pt required +2 mod A for power up and difficulty getting weight fwd. Once up, min A and +2 for safety and equipment. Second sit to stand from recliner, pt able to get up with min A.   Ambulation/Gait Ambulation/Gait assistance: Min assist;+2 safety/equipment Ambulation Distance (Feet): 70 Feet Assistive device: Rolling walker (2 wheeled) Gait Pattern/deviations: Step-through pattern;Decreased stride length;Shuffle;Trunk flexed Gait velocity: decreased Gait velocity interpretation: <1.8 ft/sec, indicative of risk for recurrent falls General Gait Details: O2 sats 89-90% on RA, 2 standing rest breaks, pt moving very slowly with flexed gait despite vc's.   Stairs            Wheelchair Mobility    Modified Rankin (Stroke Patients Only)       Balance Overall balance assessment: Needs assistance Sitting-balance support: No upper extremity supported;Feet supported Sitting balance-Leahy Scale: Fair     Standing balance support: Bilateral upper extremity supported Standing balance-Leahy Scale: Poor Standing balance comment: unable to stand without UE support on eval                             Pertinent Vitals/Pain Pain Assessment: No/denies pain    Home Living Family/patient expects to be discharged to:: Private residence Living Arrangements: Spouse/significant other Available Help at Discharge: Family;Available 24 hours/day Type of Home: House Home Access: Stairs to enter   [employer] of  Steps: 2 Home Layout: One level Home Equipment: Walker - 2 wheels      Prior Function Level of Independence: Needs assistance   Gait / Transfers Assistance Needed: has been falling at home, but before last admission in 4/16, was  independent without AD  ADL's / Homemaking Assistance Needed: wife has been assisting  Comments: pt somewhat lethargic and did not offer much information on eval and questioned validity of some answers     Hand Dominance        Extremity/Trunk Assessment   Upper Extremity Assessment: Defer to OT evaluation           Lower Extremity Assessment: Generalized weakness;RLE deficits/detail;LLE deficits/detail RLE Deficits / Details: hip flex 3+/5, knee flex/ ext 3+/5 LLE Deficits / Details: hip flex 3+/5, knee flex/ ext 3+/5. Bilateral LE's fatigue quickly in [place]  Cervical / Trunk Assessment: Normal  Communication   Communication: No difficulties  Cognition Arousal/Alertness: Lethargic Behavior During Therapy: WFL for tasks assessed/performed;Flat affect Overall Cognitive Status: Impaired/Different from baseline Area of Impairment: Memory;Following commands;Safety/judgement;Problem solving     Memory: Decreased short-term memory Following Commands: Follows one step commands consistently;Follows one step commands with increased time;Follows multi-step commands with increased time Safety/Judgement: Decreased awareness of safety;Decreased awareness of deficits   Problem Solving: Slow processing;Requires verbal cues General Comments: pt appears somewhat confused today and flat affect    General Comments      Exercises        Assessment/Plan    PT Assessment Patient needs continued PT services  PT Diagnosis Difficulty walking;Generalized weakness;Abnormality of gait   PT Problem List Decreased strength;Decreased activity tolerance;Decreased balance;Decreased mobility;Decreased cognition;Decreased safety awareness;Decreased knowledge of use of DME;Decreased knowledge of precautions;Cardiopulmonary status limiting activity  PT Treatment Interventions DME instruction;Gait training;Stair training;Functional mobility training;Therapeutic activities;Therapeutic  exercise;Balance training;Neuromuscular re-education;Cognitive remediation;Patient/family education   PT Goals (Current goals can be found in the Care Plan section) Acute Rehab PT Goals Patient Stated Goal: return home PT Goal Formulation: With patient Time For Goal Achievement: 10/25/14 Potential to Achieve Goals: Good    Frequency Min 3X/week   Barriers to discharge        Co-evaluation               End of Session Equipment Utilized During Treatment: Gait belt Activity Tolerance: Patient limited by lethargy;Patient tolerated treatment well Patient left: in chair;with call bell/phone within reach Nurse Communication: Mobility status         Time: 7517-0017 PT Time Calculation (min) (ACUTE ONLY): 21 min   Charges:   PT Evaluation $Initial PT Evaluation Tier I: 1 Procedure     PT G Codes:      Leighton Roach, PT  Acute Rehab Services  [place], [place] 10/11/2014, 4:34 PM

## 2014-10-11 NOTE — Clinical Social Work Note (Signed)
Clinical Social Work Assessment  Patient Details  Name: [name] MRN: [id number] Date of Birth: 07-25-1935  Date of referral:  10/11/14               Reason for consult:  Facility Placement                Permission sought to share information with:  Family Supports Permission granted to share information::  Yes, Verbal Permission Granted  Name::     Actuary::     Relationship::  spouse  Contact Information:     Housing/Transportation Living arrangements for the past 2 months:  [place] of Information:  Patient, Spouse Patient Interpreter Needed:  None Criminal Activity/Legal Involvement Pertinent to Current Situation/Hospitalization:  No - Comment as needed Significant Relationships:  Spouse Lives with:  Spouse Do you feel safe going back to the place where you live?  Yes Need for family participation in patient care:  Yes (Comment)  Care giving concerns:  Patient lives at home with spouse in a single level home- pt has been having frequent falls at home over the past few weeks which is why he came to the hospital to check for potential head injury   Facilities manager / plan:  CSW spoke with pt and wife at bedside to discuss potential SNF placement to increase mobility/stability  Employment status:  Retired Forensic scientist:    PT Recommendations:  Not assessed at this time Information / Referral to community resources:  [place]  Patient/Family's Response to care:  Pt and wife are hesitant to go to SNF at this time- would prefer to go home but are willing to work with PT and see how the pt feels after seeing what he is capable of   Patient/Family's Understanding of and Emotional Response to Diagnosis, Current Treatment, and Prognosis:  Pt and wife understanding of prognosis is unclear at this time but they appear to be somewhat worried about pt recent health issues and if they will continue to get  worse  Emotional Assessment Appearance:  Appears stated age, Disheveled Attitude/Demeanor/Rapport:    Affect (typically observed):  Calm, Quiet Orientation:  Oriented to Place, Oriented to  Time, Oriented to Self, Oriented to Situation Alcohol / Substance use:  Not Applicable Psych involvement (Current and /or in the community):  No (Comment)  Discharge Needs  Concerns to be addressed:  Home Safety Concerns Readmission within the last 30 days:  Yes Current discharge risk:  Physical Impairment Barriers to Discharge:  Continued Medical Work up   [name], [name] 10/11/2014, 12:31 PM

## 2014-10-11 NOTE — Progress Notes (Signed)
Pharmacist Heart Failure Core Measure Documentation  Assessment: [name] has an EF documented as 40-45% on 08/31/14 by Dr. Aundra Dubin.  Rationale: Heart failure patients with left ventricular systolic dysfunction (LVSD) and an EF < 40% should be prescribed an angiotensin converting enzyme inhibitor (ACEI) or angiotensin receptor blocker (ARB) at discharge unless a contraindication is documented in the medical record.  This patient is not currently on an ACEI or ARB for HF.  This note is being placed in the record in order to provide documentation that a contraindication to the use of these agents is present for this encounter.  ACE Inhibitor or Angiotensin Receptor Blocker is contraindicated (specify all that apply)  []   ACEI allergy AND ARB allergy []   Angioedema []   Moderate or severe aortic stenosis []   Hyperkalemia []   Hypotension []   Renal artery stenosis [x]   Worsening renal function, preexisting renal disease or dysfunction   [name], PharmD Clinical Pharmacist - Resident Pager: [phone] 5/18/20164:24 PM

## 2014-10-11 NOTE — Progress Notes (Signed)
ANTIBIOTIC CONSULT NOTE - INITIAL  Pharmacy Consult for Vancomycin/Cefepime  Indication: rule out pneumonia  Allergies  Allergen Reactions  . Niacin Itching    Niaspan     Patient Measurements: Height: 5\' 7"  (170.2 cm) Weight: 146 lb 3.2 oz (66.316 kg) IBW/kg (Calculated) : 66.1  Vital Signs: Temp: 98 F (36.7 C) (05/18 0740) Temp Source: Oral (05/18 0740) BP: 100/41 mmHg (05/18 0800) Pulse Rate: 53 (05/18 0800) Intake/Output from previous day: 05/17 0701 - 05/18 0700 In: 420 [P.O.:220; IV Piggyback:200] Out: 1975 [EQAST:4196] Intake/Output from this shift: Total I/O In: 170 [P.O.:120; IV Piggyback:50] Out: 250 [Urine:250]  Labs:  Recent Labs  10/09/14 2237 10/10/14 0529 10/11/14 0238  WBC 9.8 12.6* 10.5  HGB 7.2* 8.1* 8.0*  PLT 125* 122* 137*  CREATININE 2.31* 2.02* 1.85*   Estimated Creatinine Clearance: 31.3 mL/min (by C-G formula based on Cr of 1.85). No results for input(s): VANCOTROUGH, VANCOPEAK, VANCORANDOM, GENTTROUGH, GENTPEAK, GENTRANDOM, TOBRATROUGH, TOBRAPEAK, TOBRARND, AMIKACINPEAK, AMIKACINTROU, AMIKACIN in the last 72 hours.   Microbiology: Recent Results (from the past 720 hour(s))  MRSA PCR Screening     Status: None   Collection Time: 10/10/14  1:49 AM  Result Value Ref Range Status   MRSA by PCR NEGATIVE NEGATIVE Final    Comment:        The GeneXpert MRSA Assay (FDA approved for NASAL specimens only), is one component of a comprehensive MRSA colonization surveillance program. It is not intended to diagnose MRSA infection nor to guide or monitor treatment for MRSA infections.     Medical History: Past Medical History  Diagnosis Date  . VENTRICULAR TACHYCARDIA   . PERIPHERAL VASCULAR DISEASE   . HYPERTENSION     Hypertensive retinopathy-grade II OU  . HYPERCHOLESTEROLEMIA   . CORONARY ARTERY DISEASE     Hx of CABG  . Chronic systolic heart failure     Ischemic cardiomyopathy (01/2014 EF 40-45% with hypokinesis + small area  of akinesis  . SEBORRHEIC DERMATITIS   . Chronic renal insufficiency, stage III (moderate)     CrCl 40s-50s  . DIVERTICULOSIS OF COLON   . COPD, severe   . COLONIC POLYPS   . ANXIETY   . ICD (implantable cardiac defibrillator) in place   . Type II diabetes mellitus     +background diabetic retinopathy OU  . Chronic lower back pain   . DJD (degenerative joint disease)     "hands" (05/24/2012)  . Osteoarthritis of finger   . History of atrial flutter   . Thrombus 12/2013    on ICD lead--started anticoag and his cardioversion for atrial flutter was postponed.  . Colon stricture 2015    with features worrisome for mass, also with recent rising CEA level (possible colon cancer)-GI MD= Dr. Franki Cabot recent eval/follow up with Dr. Earlean Shawl was summer 2014, at which time he recommended colonoscopy but pt declined.  . Macular degeneration, age related, nonexudative     OU    Assessment: Here for increased confusion/falls, CXR concerning for PNA, WBC WNL, Scr 2.3 with CrCl <30, lactic acid 2.37, other labs as above.   Goal of Therapy:  Vancomycin trough level 15-20 mcg/ml  Plan:  -Vancomycin 750 mg IV q24h -Cefepime 1g IV q24h -Trend WBC, temp, renal function  -Drug levels as indicated   Georgina Peer 10/11/2014,8:56 AM

## 2014-10-11 NOTE — Evaluation (Signed)
Clinical/Bedside Swallow Evaluation Patient Details  Name: [name] MRN: [id number] Date of Birth: [birth year]  Today's Date: 10/11/2014 Time: SLP Start Time (ACUTE ONLY): 0908 SLP Stop Time (ACUTE ONLY): 0920 SLP Time Calculation (min) (ACUTE ONLY): 12 min  Past Medical History:  Past Medical History  Diagnosis Date  . VENTRICULAR TACHYCARDIA   . PERIPHERAL VASCULAR DISEASE   . HYPERTENSION     Hypertensive retinopathy-grade II OU  . HYPERCHOLESTEROLEMIA   . CORONARY ARTERY DISEASE     Hx of CABG  . Chronic systolic heart failure     Ischemic cardiomyopathy (01/2014 EF 40-45% with hypokinesis + small area of akinesis  . SEBORRHEIC DERMATITIS   . Chronic renal insufficiency, stage III (moderate)     CrCl 40s-50s  . DIVERTICULOSIS OF COLON   . COPD, severe   . COLONIC POLYPS   . ANXIETY   . ICD (implantable cardiac defibrillator) in place   . Type II diabetes mellitus     +background diabetic retinopathy OU  . Chronic lower back pain   . DJD (degenerative joint disease)     "hands" (05/24/2012)  . Osteoarthritis of finger   . History of atrial flutter   . Thrombus 12/2013    on ICD lead--started anticoag and his cardioversion for atrial flutter was postponed.  . Colon stricture 2015    with features worrisome for mass, also with recent rising CEA level (possible colon cancer)-GI MD= Dr. Franki Cabot recent eval/follow up with Dr. Earlean Shawl was summer 2014, at which time he recommended colonoscopy but pt declined.  . Macular degeneration, age related, nonexudative     OU   Past Surgical History:  Past Surgical History  Procedure Laterality Date  . Coronary artery bypass graft  1990's    CABG X4  . Tonsillectomy  ?1942  . Cardiac defibrillator placement  12/2004    single chamber defibrillator- [employer]onic Maxima 8/06 DrKlein [Other][  . Tee without cardioversion N/A 12/22/2013    Procedure: TRANSESOPHAGEAL ECHOCARDIOGRAM (TEE);  Surgeon: Thayer Headings, MD;  Location:  [place];  Service: Cardiovascular;  Laterality: N/A;  . Cardioversion N/A 12/22/2013    Procedure: CARDIOVERSION;  Surgeon: Thayer Headings, MD;  Location: [facility] ENDOSCOPY;  Service: Cardiovascular;  Laterality: N/A;  . Cardiovascular stress test  01/12/13    myocard perf imaging: previous infarct with a moderately reduced EF as was known previously.  No new findings.   . Gastric emptying scan  02/02/14    Normal  . Transthoracic echocardiogram  01/25/14; 04/2014    EF 40-45%, diffuse hypokinesis, infero-basilar myocardial akinesis, PA pressure slightly high, valves fine.04/2014-Echo today EF ~40% inferior AK moderate to severe MR  . Abdominal ultrasound  01/2014    GB sludge, o/w normal  . Ct virtual colonoscopy diagnostic  11/2012    Asymmetric thickening of the rectosigmoid colon worrisome for  . Cardiovascular stress test      Lexiscan: There is a medium sized fixed inferior wall defect of moderate severity involving the apical inferior and mid inferior and basal inferoseptal segments, consistent with old inferior MI. No significant reversible ischemia.  EF 34%, inferior wall hypokinesis--intermediate risk scan (ok to do planned procedure)  . Implantable cardioverter defibrillator revision N/A 05/25/2012    Procedure: IMPLANTABLE CARDIOVERTER DEFIBRILLATOR REVISION;  Surgeon: Evans Lance, MD;  Location: [facility] C[facility] LAB;  Service: Cardiovascular;  Laterality: N/A;  . Right heart catheterization N/A 08/31/2014    Procedure: RIGHT HEART CATH;  Surgeon: Larey Dresser, MD;  Location: [place] CATH LAB;  Service: Cardiovascular;  Laterality: N/A;  . Esophagogastroduodenoscopy N/A 09/03/2014    Procedure: ESOPHAGOGASTRODUODENOSCOPY (EGD);  Surgeon: Carol Ada, MD;  Location: [facility] ENDOSCOPY;  Service: Endoscopy;  Laterality: N/A;   HPI:  [name] is a 79 y.o. male with history of CAD status post CABG, chronic atrial fibrillation on Apixaban, ischemic cardiomyopathy, diabetes mellitus, chronic kidney  disease, COPD presents to the ER because of increasing weakness and fatigue. Found to have GI bleed with severe anemai causing weakness. CT head and x-ray spine was unremarkable.Patient was recently admitted for GI bleed and also pneumonia. During the admission for GI bleed patient had EGD which showed Candida esophagitis. Patient has been I some productive cough and chest x-ray shows possible pneumonia for which patient has been started on empiric antibiotics.    Assessment / Plan / Recommendation Clinical Impression  Pt demonstrates no immediate signs of aspiration or indication of dysphagia. He does have a baseline cough which occurred intermittently during observation, but not in conjunction with PO intake. Pt denies any history of difficulty eating or drinking. Recommend pt continue current diet. No SLP interventions needed. Will sign off.     Aspiration Risk  Mild    Diet Recommendation  (regular/thin)   Medication Administration: Whole meds with liquid    Other  Recommendations Oral Care Recommendations: Patient independent with oral care   Follow Up Recommendations       Frequency and Duration        Pertinent Vitals/Pain NA    SLP Swallow Goals     Swallow Study Prior Functional Status       General Other Pertinent Information: [name] is a 79 y.o. male with history of CAD status post CABG, chronic atrial fibrillation on Apixaban, ischemic cardiomyopathy, diabetes mellitus, chronic kidney disease, COPD presents to the ER because of increasing weakness and fatigue. Found to have GI bleed with severe anemai causing weakness. CT head and x-ray spine was unremarkable.Patient was recently admitted for GI bleed and also pneumonia. During the admission for GI bleed patient had EGD which showed Candida esophagitis. Patient has been I some productive cough and chest x-ray shows possible pneumonia for which patient has been started on empiric antibiotics.  Type of Study:  Bedside swallow evaluation Previous Swallow Assessment: none Diet Prior to this Study: Regular;Thin liquids Temperature Spikes Noted: No History of Recent Intubation: No Behavior/Cognition: Alert;Cooperative;Pleasant mood Oral Cavity - Dentition:  (top denture) Self-Feeding Abilities: Able to feed self Patient Positioning: Upright in bed Baseline Vocal Quality: Normal Volitional Cough: Strong Volitional Swallow: Able to elicit    Oral/Motor/Sensory Function Overall Oral Motor/Sensory Function: Appears within functional limits for tasks assessed   Ice Chips     Thin Liquid Thin Liquid: Within functional limits Presentation: Cup;Straw;Self Fed    Nectar Thick Nectar Thick Liquid: Not tested   Honey Thick Honey Thick Liquid: Not tested   Puree Puree: Within functional limits   Solid   GO    Solid: Within functional limits      [facility], [place] CCC-SLP [phone]  Lynann Beaver 10/11/2014,9:35 AM

## 2014-10-11 NOTE — Progress Notes (Signed)
Advanced Heart Failure Rounding Note   Subjective:    Admitted  with recurrent falls, hemoglobin of 7. Stool positive for blood.Received 2UPRBCs.  Anticoagulants and diuretics held. GI consulted with recommendations for capsule endoscopy.    Denies SOB. Complaining of fatigue.    Objective:   Weight Range:  Vital Signs:   Temp:  [97.3 F (36.3 C)-98.2 F (36.8 C)] 98 F (36.7 C) (05/18 0740) Pulse Rate:  [51-57] 53 (05/18 0800) Resp:  [10-18] 13 (05/18 0800) BP: (91-113)/(29-50) 100/41 mmHg (05/18 0800) SpO2:  [96 %-100 %] 100 % (05/18 0800) Weight:  [146 lb 3.2 oz (66.316 kg)] 146 lb 3.2 oz (66.316 kg) (05/18 0500) Last BM Date: 10/09/14  Weight change: Filed Weights   10/09/14 2206 10/10/14 0200 10/11/14 0500  Weight: 145 lb (65.772 kg) 145 lb 15.1 oz (66.2 kg) 146 lb 3.2 oz (66.316 kg)    Intake/Output:   Intake/Output Summary (Last 24 hours) at 10/11/14 0836 Last data filed at 10/11/14 0810  Gross per 24 hour  Intake    470 ml  Output   1975 ml  Net  -1505 ml     Physical Exam: General:  Chronically ill  appearing. No resp difficulty HEENT: normal Neck: supple. JVP ~10  . Carotids 2+ bilat; no bruits. No lymphadenopathy or thryomegaly appreciated. Cor: PMI nondisplaced. Regular rate & rhythm. No rubs, gallops or murmurs. Lungs: Decreased  Abdomen: soft, nontender, nondistended. No hepatosplenomegaly. No bruits or masses. Good bowel sounds. Extremities: no cyanosis, clubbing, rash,  R and LLE 2+ edema SCDs.  Neuro: alert & orientedx3, cranial nerves grossly intact. moves all 4 extremities w/o difficulty. Affect pleasant  Telemetry: Sinus Brady 50s.  Labs: Basic Metabolic Panel:  Recent Labs Lab 10/09/14 2237 10/10/14 0529 10/11/14 0238  NA 125* 126* 126*  K 2.6* 2.9* 4.4  CL 78* 86* 89*  CO2 33* 32 29  GLUCOSE 99 123* 129*  BUN 54* 48* 40*  CREATININE 2.31* 2.02* 1.85*  CALCIUM 8.3* 7.8* 8.1*  MG  --  1.5*  --     Liver Function  Tests:  Recent Labs Lab 10/09/14 2237 10/10/14 0529 10/11/14 0238  AST 81* 75* 77*  ALT 22 21 21   ALKPHOS 90 82 84  BILITOT 1.2 2.6* 1.4*  PROT 5.8* 5.0* 5.3*  ALBUMIN 2.4* 2.1* 2.1*   No results for input(s): LIPASE, AMYLASE in the last 168 hours. No results for input(s): AMMONIA in the last 168 hours.  CBC:  Recent Labs Lab 10/09/14 2237 10/10/14 0529 10/11/14 0238  WBC 9.8 12.6* 10.5  NEUTROABS 6.5 9.1*  --   HGB 7.2* 8.1* 8.0*  HCT 20.8* 23.4* 23.9*  MCV 89.3 88.3 89.5  PLT 125* 122* 137*    Cardiac Enzymes: No results for input(s): CKTOTAL, CKMB, CKMBINDEX, TROPONINI in the last 168 hours.  BNP: BNP (last 3 results)  Recent Labs  08/30/14 1639 09/09/14 1150 10/09/14 2237  BNP 800.7* 550.7* 715.8*    ProBNP (last 3 results)  Recent Labs  01/24/14 1909 02/09/14 0945  PROBNP 6721.0* 2555.0*      Other results:  Imaging: Dg Chest 2 View  10/09/2014   CLINICAL DATA:  Weakness, shortness of breath.  History of COPD.  EXAM: CHEST  2 VIEW  COMPARISON:  09/11/2014  FINDINGS: Lungs are hyperinflated with emphysema. There are bilateral calcified pleural plaques. Dual lead left-sided pacemaker remains in place. Patient is post median sternotomy. Stable cardiomegaly. No pulmonary edema. Improved right upper lobe opacity compared to  prior. Increased opacity at the right costophrenic angle may reflect new focal consolidation, atelectasis, or less likely pleural effusion. No left pleural effusion. No pneumothorax. No acute osseous abnormalities are seen.  IMPRESSION: Increasing opacity at the right costophrenic angle that is new/ progressed from prior exams and may reflect atelectasis, pneumonia or less likely pleural effusion.  Underlying chronic lung disease, bilateral pleural plaques and emphysema. Stable cardiomegaly.   Electronically Signed   By: Jeb Levering M.D.   On: 10/09/2014 23:23   Dg Thoracic Spine W/swimmers  10/09/2014   CLINICAL DATA:  Acute  onset of chronic upper back pain. Initial encounter.  EXAM: THORACIC SPINE - 2 VIEW + SWIMMERS  COMPARISON:  Chest radiograph performed 09/11/2014  FINDINGS: There is no evidence of fracture or subluxation. Vertebral bodies demonstrate normal height and alignment. Mild degenerative change is noted along the cervical spine.  The visualized portions of both lungs are clear. The mediastinum is borderline normal in size. The patient is status post median sternotomy, with evidence of prior CABG. AICD leads are noted.  IMPRESSION: No evidence of fracture or subluxation along the cervical spine.   Electronically Signed   By: Garald Balding M.D.   On: 10/09/2014 23:22   Dg Lumbar Spine 2-3 Views  10/09/2014   CLINICAL DATA:  Chronic lower back pain.  Initial encounter.  EXAM: LUMBAR SPINE - 2-3 VIEW  COMPARISON:  CT of the abdomen and pelvis performed 12/07/2012  FINDINGS: There is no evidence of acute fracture or subluxation. Vertebral bodies demonstrate normal height. There is grade 1 anterolisthesis of L3 on L4. Mild disc space narrowing is noted at L2-L3 and L5-S1. Associated endplate sclerotic change is noted.  The visualized bowel gas pattern is unremarkable in appearance; air and stool are noted within the colon. The sacroiliac joints are within normal limits. Scattered vascular calcifications are seen.  IMPRESSION: 1. No evidence of acute fracture or subluxation along the lumbar spine. 2. Mild degenerative change noted along the lumbar spine. 3. Scattered vascular calcifications seen.   Electronically Signed   By: Garald Balding M.D.   On: 10/09/2014 23:24   Ct Head Wo Contrast  10/09/2014   CLINICAL DATA:  Acute onset of generalized weakness. Back pain. Frequent falls. Worsening confusion. Initial encounter.  EXAM: CT HEAD WITHOUT CONTRAST  TECHNIQUE: Contiguous axial images were obtained from the base of the skull through the vertex without intravenous contrast.  COMPARISON:  CT of the neck performed  03/11/2007  FINDINGS: There is no evidence of acute infarction, mass lesion, or intra- or extra-axial hemorrhage on CT.  Prominence of the sulci suggests mild cortical volume loss. Mild cerebellar atrophy is noted. Mild periventricular white matter change likely reflects small vessel ischemic microangiopathy.  The brainstem and fourth ventricle are within normal limits. The third and lateral ventricles, and basal ganglia are unremarkable in appearance. The cerebral hemispheres are symmetric in appearance, with normal gray-white differentiation. No mass effect or midline shift is seen.  There is no evidence of fracture; visualized osseous structures are unremarkable in appearance. The orbits are within normal limits. The paranasal sinuses and mastoid air cells are well-aerated. No significant soft tissue abnormalities are seen.  IMPRESSION: 1. No acute intracranial pathology seen on CT. 2. Mild cortical volume loss and scattered small vessel ischemic microangiopathy.   Electronically Signed   By: Garald Balding M.D.   On: 10/09/2014 23:46      Medications:     Scheduled Medications: . amiodarone  200 mg  Oral Daily  . bisacodyl  5 mg Oral Once  . carvedilol  3.125 mg Oral BID WC  . ceFEPime (MAXIPIME) IV  1 g Intravenous Q24H  . folic acid  1 mg Oral Daily  . hydrALAZINE  25 mg Oral TID  . insulin aspart  0-9 Units Subcutaneous TID WC  . insulin glargine  10 Units Subcutaneous QHS  . isosorbide mononitrate  30 mg Oral Daily  . levothyroxine  75 mcg Oral QAC breakfast  . mometasone-formoterol  2 puff Inhalation BID  . pantoprazole  40 mg Oral Q0600  . polyethylene glycol  17 g Oral Once   Followed by  . polyethylene glycol  17 g Oral Once  . potassium chloride  40 mEq Oral TID  . sodium chloride  3 mL Intravenous Q12H  . thiamine  100 mg Oral Daily  . tiotropium  18 mcg Inhalation Daily  . torsemide  40 mg Oral Daily  . vancomycin  750 mg Intravenous Q24H     Infusions:     PRN  Medications:  acetaminophen **OR** acetaminophen, ondansetron **OR** ondansetron (ZOFRAN) IV   Assessment:   1. Falls  2. Anemia on admit 7.2>8.1  3. Chronic Systolic HF- ICM- ECHO 06/28/3005 EF 40-45%  4. PAF on chronic amiodarone and eliquis 5. DM 6. H/O VT  7. Anemia  8. Chronic Respiratory Failure- on 2 liters [place] at home  9. Hypothroid 10. HCAP 11. CKD- Creatinine baseline 1.8-2.0  12. Hyponatremia - 125 on admit   Plan/Discussion:   Anemia- GI evaluated with recommendations for capsule endoscopy to evaluate small bowel. Ok to resume eliquis per GI. Will restart lower dose of eliquis 2.5 mg twice a day. Discussed with Dr Haroldine Laws. Received 2UPRBCs yesterday-->. Hgb 7.2>8.1   Volume status trending up. Restart torsemide today. Continue low dose carvedilol. Heart rates in the 50s. Continue current dose of hydralazine/imdur. No ace with CKD  HCAP- on antibiotics per primary team.   PT consult pending. Wife concerned about taking him home.    Length of Stay: 1   CLEGG,AMY NP-C  10/11/2014, 8:36 AM  Advanced Heart Failure Team Pager [phone] (M-F; [place])  Please contact [facility] for night-coverage after hours (4p -7a ) and weekends on [email or web address]  Patient seen and examined with Darrick Grinder, NP. We discussed all aspects of the encounter. I agree with the assessment and plan as stated above.   HF stable. Can restart diuretics. Discussed GOI situation with Dr. Henrene Pastor. Agree with capsule endo. Will restart apixaban at 2.5 bid.   Agree with PT consult.   Chayson Charters,MD 1:30 PM

## 2014-10-11 NOTE — Progress Notes (Addendum)
[facility] PROGRESS NOTE  [name] JYN:[id number] DOB: 07-Dec-1935 DOA: 10/09/2014 PCP: Tammi Sou, MD  Assessment/Plan: 1. Falls/weakness and deconditioning: most likely due to anemia and HCAP -will continue treating with antibiotics -PRN transfusion  -complete work up to identified bleeding -per PT recommendations will ask CIR to evaluate for rehab at discharge  2. Anemia due to iron deficiency in GIB: on admit Hgb 7.2>8.1 after 2 units of PRBC's given -plan is for capsule endoscopy on 5/19 -will monitor Hgb  3. Chronic Systolic HF- ICM- ECHO 12/30/5782 EF 40-45% . -heart failure team on board; will follow rec's -patient with slight increase in volume today -Cr stable -will resume torsemide -no ACE/ARB given CKD  4. PAF on chronic amiodarone and eliquis -eliquis was initially on hold; following cardiology rec's will resume low dose of 2.5mg  BID today -capsule endoscopy planned for 5/19  5. DM: will continue SSI  6. Chronic Respiratory Failure- on 2 liters [place] at home: will add flutter valve -continue oxygen supplementation  7.HCAP: will continue vanc and cefepime -start flutter valve -PRN nebulizer therapy -mucinex  8.Hypothroid: continue synthroid  9.CKD stage 3 at baseline-  -Creatinine baseline 1.8-2.0  -Cr back to baseline; will monitor now that diuretics resume  Code Status: Full Family Communication: daughter at bedside  Disposition Plan: to be determine; remains in stepdown for today   Consultants:  GI  Heart Failure team  CIR   Procedures:  See below for x-ray reports  Capsule endoscopy planned for 5/19  Antibiotics:  vanc 5/16  Cefepime 5/16  HPI/Subjective: Afebrile, feeling weak and fatigued. No CP, no significant SOB. Denies active blood in his stools   Objective: Filed Vitals:   10/11/14 1705  BP: 138/47  Pulse: 59  Temp:   Resp: 19    Intake/Output Summary (Last 24 hours) at 10/11/14 1813 Last data  filed at 10/11/14 1700  Gross per 24 hour  Intake    680 ml  Output   1375 ml  Net   -695 ml   Filed Weights   10/09/14 2206 10/10/14 0200 10/11/14 0500  Weight: 65.772 kg (145 lb) 66.2 kg (145 lb 15.1 oz) 66.316 kg (146 lb 3.2 oz)    Exam:   General:  Patient is afebrile, with dry cough. Denies CP and significant SOB.  Cardiovascular: no rubs, no gallops, rate controlled; no murmurs  Respiratory: decrease BS at bases, scattered rhonchi, no wheezing  Abdomen: soft, NT, ND, positive BS  Musculoskeletal: 1++ edema, no cyanosis or clubbing   Data Reviewed: Basic Metabolic Panel:  Recent Labs Lab 10/09/14 2237 10/10/14 0529 10/11/14 0238  NA 125* 126* 126*  K 2.6* 2.9* 4.4  CL 78* 86* 89*  CO2 33* 32 29  GLUCOSE 99 123* 129*  BUN 54* 48* 40*  CREATININE 2.31* 2.02* 1.85*  CALCIUM 8.3* 7.8* 8.1*  MG  --  1.5*  --    Liver Function Tests:  Recent Labs Lab 10/09/14 2237 10/10/14 0529 10/11/14 0238  AST 81* 75* 77*  ALT 22 21 21   ALKPHOS 90 82 84  BILITOT 1.2 2.6* 1.4*  PROT 5.8* 5.0* 5.3*  ALBUMIN 2.4* 2.1* 2.1*   CBC:  Recent Labs Lab 10/09/14 2237 10/10/14 0529 10/11/14 0238  WBC 9.8 12.6* 10.5  NEUTROABS 6.5 9.1*  --   HGB 7.2* 8.1* 8.0*  HCT 20.8* 23.4* 23.9*  MCV 89.3 88.3 89.5  PLT 125* 122* 137*   BNP (last 3 results)  Recent Labs  08/30/14 1639 09/09/14  1150 10/09/14 2237  BNP 800.7* 550.7* 715.8*    ProBNP (last 3 results)  Recent Labs  01/24/14 1909 02/09/14 0945  PROBNP 6721.0* 2555.0*    CBG:  Recent Labs Lab 10/10/14 1605 10/10/14 2131 10/11/14 0743 10/11/14 1128 10/11/14 1710  GLUCAP 205* 108* 130* 147* 180*    Recent Results (from the past 240 hour(s))  Urine culture     Status: None (Preliminary result)   Collection Time: 10/10/14 12:14 AM  Result Value Ref Range Status   Specimen Description URINE, CLEAN CATCH  Final   Special Requests NONE  Final   Culture   Final    Culture reincubated for better  growth Performed at [facility]    Report Status PENDING  Incomplete  MRSA PCR Screening     Status: None   Collection Time: 10/10/14  1:49 AM  Result Value Ref Range Status   MRSA by PCR NEGATIVE NEGATIVE Final    Comment:        The GeneXpert MRSA Assay (FDA approved for NASAL specimens only), is one component of a comprehensive MRSA colonization surveillance program. It is not intended to diagnose MRSA infection nor to guide or monitor treatment for MRSA infections.      Studies: Dg Chest 2 View  10/09/2014   CLINICAL DATA:  Weakness, shortness of breath.  History of COPD.  EXAM: CHEST  2 VIEW  COMPARISON:  09/11/2014  FINDINGS: Lungs are hyperinflated with emphysema. There are bilateral calcified pleural plaques. Dual lead left-sided pacemaker remains in place. Patient is post median sternotomy. Stable cardiomegaly. No pulmonary edema. Improved right upper lobe opacity compared to prior. Increased opacity at the right costophrenic angle may reflect new focal consolidation, atelectasis, or less likely pleural effusion. No left pleural effusion. No pneumothorax. No acute osseous abnormalities are seen.  IMPRESSION: Increasing opacity at the right costophrenic angle that is new/ progressed from prior exams and may reflect atelectasis, pneumonia or less likely pleural effusion.  Underlying chronic lung disease, bilateral pleural plaques and emphysema. Stable cardiomegaly.   Electronically Signed   By: Jeb Levering M.D.   On: 10/09/2014 23:23   Dg Thoracic Spine W/swimmers  10/09/2014   CLINICAL DATA:  Acute onset of chronic upper back pain. Initial encounter.  EXAM: THORACIC SPINE - 2 VIEW + SWIMMERS  COMPARISON:  Chest radiograph performed 09/11/2014  FINDINGS: There is no evidence of fracture or subluxation. Vertebral bodies demonstrate normal height and alignment. Mild degenerative change is noted along the cervical spine.  The visualized portions of both lungs are clear.  The mediastinum is borderline normal in size. The patient is status post median sternotomy, with evidence of prior CABG. AICD leads are noted.  IMPRESSION: No evidence of fracture or subluxation along the cervical spine.   Electronically Signed   By: Garald Balding M.D.   On: 10/09/2014 23:22   Dg Lumbar Spine 2-3 Views  10/09/2014   CLINICAL DATA:  Chronic lower back pain.  Initial encounter.  EXAM: LUMBAR SPINE - 2-3 VIEW  COMPARISON:  CT of the abdomen and pelvis performed 12/07/2012  FINDINGS: There is no evidence of acute fracture or subluxation. Vertebral bodies demonstrate normal height. There is grade 1 anterolisthesis of L3 on L4. Mild disc space narrowing is noted at L2-L3 and L5-S1. Associated endplate sclerotic change is noted.  The visualized bowel gas pattern is unremarkable in appearance; air and stool are noted within the colon. The sacroiliac joints are within normal limits. Scattered vascular calcifications  are seen.  IMPRESSION: 1. No evidence of acute fracture or subluxation along the lumbar spine. 2. Mild degenerative change noted along the lumbar spine. 3. Scattered vascular calcifications seen.   Electronically Signed   By: Garald Balding M.D.   On: 10/09/2014 23:24   Ct Head Wo Contrast  10/09/2014   CLINICAL DATA:  Acute onset of generalized weakness. Back pain. Frequent falls. Worsening confusion. Initial encounter.  EXAM: CT HEAD WITHOUT CONTRAST  TECHNIQUE: Contiguous axial images were obtained from the base of the skull through the vertex without intravenous contrast.  COMPARISON:  CT of the neck performed 03/11/2007  FINDINGS: There is no evidence of acute infarction, mass lesion, or intra- or extra-axial hemorrhage on CT.  Prominence of the sulci suggests mild cortical volume loss. Mild cerebellar atrophy is noted. Mild periventricular white matter change likely reflects small vessel ischemic microangiopathy.  The brainstem and fourth ventricle are within normal limits. The third  and lateral ventricles, and basal ganglia are unremarkable in appearance. The cerebral hemispheres are symmetric in appearance, with normal gray-white differentiation. No mass effect or midline shift is seen.  There is no evidence of fracture; visualized osseous structures are unremarkable in appearance. The orbits are within normal limits. The paranasal sinuses and mastoid air cells are well-aerated. No significant soft tissue abnormalities are seen.  IMPRESSION: 1. No acute intracranial pathology seen on CT. 2. Mild cortical volume loss and scattered small vessel ischemic microangiopathy.   Electronically Signed   By: Garald Balding M.D.   On: 10/09/2014 23:46    Scheduled Meds: . amiodarone  200 mg Oral Daily  . apixaban  2.5 mg Oral BID  . carvedilol  3.125 mg Oral BID WC  . ceFEPime (MAXIPIME) IV  1 g Intravenous Q24H  . folic acid  1 mg Oral Daily  . hydrALAZINE  25 mg Oral TID  . insulin aspart  0-9 Units Subcutaneous TID WC  . insulin glargine  10 Units Subcutaneous QHS  . isosorbide mononitrate  30 mg Oral Daily  . levothyroxine  75 mcg Oral QAC breakfast  . mometasone-formoterol  2 puff Inhalation BID  . pantoprazole  40 mg Oral Q0600  . polyethylene glycol  17 g Oral Once  . sodium chloride  3 mL Intravenous Q12H  . thiamine  100 mg Oral Daily  . tiotropium  18 mcg Inhalation Daily  . torsemide  40 mg Oral Daily  . vancomycin  750 mg Intravenous Q24H   Continuous Infusions:   Principal Problem:   GI bleed Active Problems:   Essential hypertension   COPD, severe   Chronic systolic CHF (congestive heart failure), NYHA class 4   CKD (chronic kidney disease) stage 3, GFR 30-59 ml/min   Anemia   Diabetes mellitus type 2, controlled   Heme positive stool   Anemia due to chronic blood loss   Chronic anticoagulation    Time spent: 35 minutes    Barton Dubois  [facility] Pager [phone]. If 7PM-7AM, please contact night-coverage at [email or web address], password  [facility] 10/11/2014, 6:13 PM  LOS: 1 day

## 2014-10-12 ENCOUNTER — Encounter (HOSPITAL_COMMUNITY)
Admission: EM | Disposition: A | Discharge status: Rehabilitation Facility | Payer: Self-pay | Source: Home / Self Care | Attending: Internal Medicine

## 2014-10-12 ENCOUNTER — Encounter (HOSPITAL_COMMUNITY): Payer: Self-pay | Admitting: *Deleted

## 2014-10-12 ENCOUNTER — Encounter (HOSPITAL_COMMUNITY): Payer: Medicare Other

## 2014-10-12 DIAGNOSIS — J449 Chronic obstructive pulmonary disease, unspecified: Secondary | ICD-10-CM

## 2014-10-12 DIAGNOSIS — R5381 Other malaise: Secondary | ICD-10-CM

## 2014-10-12 DIAGNOSIS — N183 Chronic kidney disease, stage 3 (moderate): Secondary | ICD-10-CM

## 2014-10-12 DIAGNOSIS — I4891 Unspecified atrial fibrillation: Secondary | ICD-10-CM

## 2014-10-12 HISTORY — PX: GIVENS CAPSULE STUDY: SHX5432

## 2014-10-12 LAB — CBC
HCT: 24.6 % — ABNORMAL LOW (ref 39.0–52.0)
HEMOGLOBIN: 8.2 g/dL — AB (ref 13.0–17.0)
MCH: 30.3 pg (ref 26.0–34.0)
MCHC: 33.3 g/dL (ref 30.0–36.0)
MCV: 90.8 fL (ref 78.0–100.0)
Platelets: 120 10*3/uL — ABNORMAL LOW (ref 150–400)
RBC: 2.71 MIL/uL — ABNORMAL LOW (ref 4.22–5.81)
RDW: 18.1 % — AB (ref 11.5–15.5)
WBC: 8.6 10*3/uL (ref 4.0–10.5)

## 2014-10-12 LAB — BASIC METABOLIC PANEL
Anion gap: 8 (ref 5–15)
BUN: 35 mg/dL — ABNORMAL HIGH (ref 6–20)
CALCIUM: 7.8 mg/dL — AB (ref 8.9–10.3)
CO2: 29 mmol/L (ref 22–32)
Chloride: 90 mmol/L — ABNORMAL LOW (ref 101–111)
Creatinine, Ser: 1.78 mg/dL — ABNORMAL HIGH (ref 0.61–1.24)
GFR calc Af Amer: 41 mL/min — ABNORMAL LOW (ref >60)
GFR calc non Af Amer: 35 mL/min — ABNORMAL LOW (ref >60)
GLUCOSE: 134 mg/dL — AB (ref 65–99)
Potassium: 4 mmol/L (ref 3.5–5.1)
Sodium: 127 mmol/L — ABNORMAL LOW (ref 135–145)

## 2014-10-12 LAB — GLUCOSE, CAPILLARY
GLUCOSE-CAPILLARY: 127 mg/dL — AB (ref 65–99)
GLUCOSE-CAPILLARY: 190 mg/dL — AB (ref 65–99)
Glucose-Capillary: 142 mg/dL — ABNORMAL HIGH (ref 65–99)

## 2014-10-12 LAB — MAGNESIUM: Magnesium: 1.6 mg/dL — ABNORMAL LOW (ref 1.7–2.4)

## 2014-10-12 SURGERY — IMAGING PROCEDURE, GI TRACT, INTRALUMINAL, VIA CAPSULE
Anesthesia: LOCAL

## 2014-10-12 MED ORDER — MAGNESIUM SULFATE 2 GM/50ML IV SOLN
2.0000 g | Freq: Once | INTRAVENOUS | Status: AC
  Administered 2014-10-12: 2 g via INTRAVENOUS
  Filled 2014-10-12: qty 50

## 2014-10-12 SURGICAL SUPPLY — 1 items: TOWEL COTTON PACK 4EA (MISCELLANEOUS) ×4 IMPLANT

## 2014-10-12 NOTE — Progress Notes (Signed)
Physical Therapy Treatment Patient Details Name: [name] MRN: [id number] DOB: [birth year] Today's Date: 10/12/2014    History of Present Illness [name] is a 79 y.o. male with history of CAD status post CABG, chronic A fib, ischemic cardiomyopathy, DM, CKD, COPD presents to the ER because of increasing weakness and fatigue and frequent falls. CT head and x-ray spine was unremarkable (pt with upper back pain). Patient's hemoglobin is around 7 which is a drop of 3 g from previous from recent discharge. Patient was recently admitted for GI bleed and also pneumonia.     PT Comments    Patient making progress with mobility.  Continues to have decreased balance and activity tolerance, impacting mobility/safety.  Agree with need for CIR at discharge.  Follow Up Recommendations  CIR;Supervision/Assistance - 24 hour     Equipment Recommendations  None recommended by PT    Recommendations for Other Services Rehab consult     Precautions / Restrictions Precautions Precautions: Fall Precaution Comments: Multiple falls pta. Restrictions Weight Bearing Restrictions: No    Mobility  Bed Mobility Overal bed mobility: Needs Assistance Bed Mobility: Supine to Sit;Sit to Supine     Supine to sit: Min guard Sit to supine: Min guard   General bed mobility comments: no physical assist, used rail, extra time  Transfers Overall transfer level: Needs assistance Equipment used: Rolling walker (2 wheeled) Transfers: Sit to/from Stand Sit to Stand: Min assist         General transfer comment: cues for hand placement, steadying assist  Ambulation/Gait Ambulation/Gait assistance: Min assist Ambulation Distance (Feet): 90 Feet Assistive device: Rolling walker (2 wheeled) Gait Pattern/deviations: Step-through pattern;Decreased stride length;Trunk flexed Gait velocity: decreased Gait velocity interpretation: Below normal speed for age/gender General Gait Details: Very  slow, guarded gait with RW.  Assist due to unsteady gait.  Patient fatigues quickly, requiring 2 standing rest breaks for 90'.   Stairs            Wheelchair Mobility    Modified Rankin (Stroke Patients Only)       Balance           Standing balance support: Bilateral upper extremity supported Standing balance-Leahy Scale: Poor                      Cognition Arousal/Alertness: Awake/alert Behavior During Therapy: Flat affect Overall Cognitive Status: Impaired/Different from baseline Area of Impairment: Memory;Problem solving     Memory: Decreased short-term memory       Problem Solving: Slow processing;Requires verbal cues General Comments: per RN, wife had stated that pt has not been himself cognitively.  Patient looking for "itch cream" in his bags.  He looked twice in each bag, unable to locate.  PT located in one of the bags.    Exercises      General Comments        Pertinent Vitals/Pain Pain Assessment: No/denies pain    Home Living                      Prior Function            PT Goals (current goals can now be found in the care plan section) Progress towards PT goals: Progressing toward goals    Frequency  Min 3X/week    PT Plan Current plan remains appropriate    Co-evaluation             End of Session Equipment Utilized  During Treatment: Gait belt Activity Tolerance: Patient limited by fatigue Patient left: in bed;with call bell/phone within reach;with bed alarm set     Time: 1840-1856 PT Time Calculation (min) (ACUTE ONLY): 16 min  Charges:  $Gait Training: 8-22 mins                    G Codes:      Despina Pole Oct 19, 2014, 7:29 PM Carita Pian. Sanjuana Kava, [place] Pager [phone]

## 2014-10-12 NOTE — Progress Notes (Signed)
[facility] PROGRESS NOTE  [name] QVZ:[id number] DOB: Dec 09, 1935 DOA: 10/09/2014 PCP: Tammi Sou, MD  Assessment/Plan: 1. Falls/weakness and deconditioning/HCAP: most likely due to anemia and HCAP -will continue treating with antibiotics, on vanc and cefepime; will treat for another 24 hours with IV antibiotics and then transition to PO if remains stable to complete abx's therapy -PRN transfusion; Hgb stable at 8.2 -complete work up to identified bleeding; capsule endoscopy today 5/19 -per PT recommendations will ask CIR to evaluate for rehab at discharge  2. Anemia due to iron deficiency in GIB: on admit Hgb 7.2>8.1>8.2 after 2 units of PRBC's given on admission  -plan is for capsule endoscopy on 5/19 -will monitor Hgb  3. Chronic Systolic HF- ICM- ECHO 07/25/9516 EF 40-45% . -heart failure team on board; will follow rec's -patient with slight increase in volume (up 5 pounds; but asymptomatic) -Cr stable -will continue PO torsemide -no ACE/ARB given CKD  4. PAF on chronic amiodarone and eliquis -eliquis was initially on hold; following cardiology rec's will continue low dose of 2.5mg  BID (resumed on 5/18) -capsule endoscopy planned for today 5/19  5. DM: will continue SSI  6. COPD with chronic Respiratory Failure- on 2 liters  at home:  -will continue flutter valve -continue oxygen supplementation  7. HCAP: will continue vanc and cefepime -continue flutter valve and pulmicort -PRN nebulizer therapy -mucinex  8. Hypothroid: continue synthroid  9.CKD stage 3 at baseline-  -Creatinine baseline 1.8-2.0  -Cr back to baseline (1.78); will monitor trend  Code Status: Full Family Communication: daughter at bedside on 5/18; no family on examination on 5/19 Disposition Plan: to be determine; will transfer to telemetry bed   Consultants:  GI  Heart Failure team  CIR   Procedures:  See below for x-ray reports  Capsule endoscopy planned for  5/19  Antibiotics:  vanc 5/16  Cefepime 5/16  HPI/Subjective: Afebrile, denies CP and SOB. No orthopnea. Patient with intermittent cough and some back pain. Hgb stable.  Objective: Filed Vitals:   10/12/14 0757  BP:   Pulse: 57  Temp: 98.1 F (36.7 C)  Resp: 11    Intake/Output Summary (Last 24 hours) at 10/12/14 1008 Last data filed at 10/12/14 0700  Gross per 24 hour  Intake    510 ml  Output   2410 ml  Net  -1900 ml   Filed Weights   10/10/14 0200 10/11/14 0500 10/12/14 0428  Weight: 66.2 kg (145 lb 15.1 oz) 66.316 kg (146 lb 3.2 oz) 68.811 kg (151 lb 11.2 oz)    Exam:   General:  Patient is afebrile, continue to have intermittent cough. Denies CP and significant SOB. Was laying down flat on bed on my exam.  Cardiovascular: no rubs, no gallops, rate controlled; no murmurs  Respiratory: decrease BS at bases, scattered rhonchi, no wheezing  Abdomen: soft, NT, ND, positive BS  Musculoskeletal: 1++ edema, no cyanosis or clubbing   Data Reviewed: Basic Metabolic Panel:  Recent Labs Lab 10/09/14 2237 10/10/14 0529 10/11/14 0238 10/12/14 0305  NA 125* 126* 126* 127*  K 2.6* 2.9* 4.4 4.0  CL 78* 86* 89* 90*  CO2 33* 32 29 29  GLUCOSE 99 123* 129* 134*  BUN 54* 48* 40* 35*  CREATININE 2.31* 2.02* 1.85* 1.78*  CALCIUM 8.3* 7.8* 8.1* 7.8*  MG  --  1.5*  --  1.6*   Liver Function Tests:  Recent Labs Lab 10/09/14 2237 10/10/14 0529 10/11/14 0238  AST 81* 75* 77*  ALT  22 21 21   ALKPHOS 90 82 84  BILITOT 1.2 2.6* 1.4*  PROT 5.8* 5.0* 5.3*  ALBUMIN 2.4* 2.1* 2.1*   CBC:  Recent Labs Lab 10/09/14 2237 10/10/14 0529 10/11/14 0238 10/12/14 0305  WBC 9.8 12.6* 10.5 8.6  NEUTROABS 6.5 9.1*  --   --   HGB 7.2* 8.1* 8.0* 8.2*  HCT 20.8* 23.4* 23.9* 24.6*  MCV 89.3 88.3 89.5 90.8  PLT 125* 122* 137* 120*   BNP (last 3 results)  Recent Labs  08/30/14 1639 09/09/14 1150 10/09/14 2237  BNP 800.7* 550.7* 715.8*    ProBNP (last 3  results)  Recent Labs  01/24/14 1909 02/09/14 0945  PROBNP 6721.0* 2555.0*    CBG:  Recent Labs Lab 10/11/14 0743 10/11/14 1128 10/11/14 1710 10/11/14 2112 10/12/14 0755  GLUCAP 130* 147* 180* 161* 127*    Recent Results (from the past 240 hour(s))  Urine culture     Status: None (Preliminary result)   Collection Time: 10/10/14 12:14 AM  Result Value Ref Range Status   Specimen Description URINE, CLEAN CATCH  Final   Special Requests NONE  Final   Colony Count   Final    20,OOO COLONIES/ML Performed at [facility]    Culture   Final    STAPHYLOCOCCUS SPECIES (COAGULASE NEGATIVE) Note: RIFAMPIN AND GENTAMICIN SHOULD NOT BE USED AS SINGLE DRUGS FOR TREATMENT OF STAPH INFECTIONS. Performed at [facility]    Report Status PENDING  Incomplete  MRSA PCR Screening     Status: None   Collection Time: 10/10/14  1:49 AM  Result Value Ref Range Status   MRSA by PCR NEGATIVE NEGATIVE Final    Comment:        The GeneXpert MRSA Assay (FDA approved for NASAL specimens only), is one component of a comprehensive MRSA colonization surveillance program. It is not intended to diagnose MRSA infection nor to guide or monitor treatment for MRSA infections.      Studies: No results found.  Scheduled Meds: . amiodarone  200 mg Oral Daily  . apixaban  2.5 mg Oral BID  . budesonide (PULMICORT) nebulizer solution  0.25 mg Nebulization BID  . carvedilol  3.125 mg Oral BID WC  . ceFEPime (MAXIPIME) IV  1 g Intravenous Q24H  . folic acid  1 mg Oral Daily  . guaiFENesin  600 mg Oral BID  . hydrALAZINE  25 mg Oral TID  . insulin aspart  0-9 Units Subcutaneous TID WC  . insulin glargine  10 Units Subcutaneous QHS  . isosorbide mononitrate  30 mg Oral Daily  . levothyroxine  75 mcg Oral QAC breakfast  . magnesium sulfate 1 - 4 g bolus IVPB  2 g Intravenous Once  . pantoprazole  40 mg Oral Q0600  . sodium chloride  3 mL Intravenous Q12H  . thiamine  100 mg Oral  Daily  . tiotropium  18 mcg Inhalation Daily  . torsemide  40 mg Oral Daily  . vancomycin  750 mg Intravenous Q24H   Continuous Infusions:   Principal Problem:   GI bleed Active Problems:   Essential hypertension   COPD, severe   Chronic systolic CHF (congestive heart failure), NYHA class 4   CKD (chronic kidney disease) stage 3, GFR 30-59 ml/min   Anemia   Diabetes mellitus type 2, controlled   Heme positive stool   Anemia due to chronic blood loss   Chronic anticoagulation   Weakness   Chronic respiratory failure with hypoxia  Time spent: 35 minutes    Barton Dubois  [facility] Pager [phone]. If 7PM-7AM, please contact night-coverage at [email or web address], password [facility] 10/12/2014, 10:08 AM  LOS: 2 days

## 2014-10-12 NOTE — Progress Notes (Signed)
Did not have BMs over night despite some laxatives.  Still plan to proceed with capsule endo.  Hopefully this can get read before tomorrow evening.  Azucena Freed  PA-C [id number].    GI ATTENDING  Waiting capsule endoscopy to be completed. I do NOT anticipate this being read before next week. However, he is not actively bleeding and is now on lower dose Eliquis. Final GI recommendations to follow after completion of examination.  Docia Chuck. Geri Seminole., M.D. [facility] Division of Gastroenterology

## 2014-10-12 NOTE — Evaluation (Signed)
Occupational Therapy Evaluation Patient Details Name: [name] MRN: [id number] DOB: 11-05-1935 Today's Date: 10/12/2014    History of Present Illness [name] is a 79 y.o. male with history of CAD status post CABG, chronic A fib, ischemic cardiomyopathy, DM, CKD, COPD presents to the ER because of increasing weakness and fatigue and frequent falls. CT head and x-ray spine was unremarkable (pt with upper back pain). Patient's hemoglobin is around 7 which is a drop of 3 g from previous from recent discharge. Patient was recently admitted for GI bleed and also pneumonia.    Clinical Impression   Pt with hx of multiple falls at home. Wife was assisting pt with LB dressing due to fatigue particularly with compression hose. Pt was standing to shower. Pt presents with generalized weakness, decreased activity tolerance, and slow processing and memory deficits which are not baseline interfering with pt's ability to perform ADL and ADL transfers.  Pt will need post acute rehab prior to return home. Will follow acutely.    Follow Up Recommendations  CIR    Equipment Recommendations  Other (comment) (defer to next venue)    Recommendations for Other Services       Precautions / Restrictions Precautions Precautions: Fall Precaution Comments: pt has fallen several times since last admission, says his legs just gave out. All reports he did not have his RW with him, though questionable historian      Mobility Bed Mobility Overal bed mobility: Needs Assistance Bed Mobility: Supine to Sit     Supine to sit: Min guard     General bed mobility comments: no physical assist, used rail, extra time  Transfers Overall transfer level: Needs assistance Equipment used: Rolling walker (2 wheeled) Transfers: Sit to/from Stand Sit to Stand: Min assist Stand pivot transfers: Min assist       General transfer comment: cues for hand placement, steadying assist    Balance Overall  balance assessment: Needs assistance   Sitting balance-Leahy Scale: Fair       Standing balance-Leahy Scale: Poor Standing balance comment: can release one hand from walker in standing, but feels uncomfortable due to LE weakness                            ADL Overall ADL's : Needs assistance/impaired Eating/Feeding: Independent;Sitting Eating/Feeding Details (indicate cue type and reason): opened packaging independently Grooming: Wash/dry hands;Sitting;Set up   Upper Body Bathing: Supervision/ safety;Sitting   Lower Body Bathing: Sit to/from stand;Moderate assistance   Upper Body Dressing : Minimal assistance;Sitting Upper Body Dressing Details (indicate cue type and reason): front opening gown Lower Body Dressing: Sit to/from stand;Moderate assistance   Toilet Transfer: Minimal assistance;Stand-pivot;RW Toilet Transfer Details (indicate cue type and reason): simulated bed to chair Toileting- Clothing Manipulation and Hygiene: Sit to/from stand;Moderate assistance               Vision     Perception     Praxis      Pertinent Vitals/Pain Pain Assessment: No/denies pain     Hand Dominance Right   Extremity/Trunk Assessment Upper Extremity Assessment Upper Extremity Assessment: Generalized weakness   Lower Extremity Assessment Lower Extremity Assessment: Generalized weakness (tendency for LEs to buckle)   Cervical / Trunk Assessment Cervical / Trunk Assessment: Normal   Communication Communication Communication: No difficulties   Cognition Arousal/Alertness: Awake/alert Behavior During Therapy: WFL for tasks assessed/performed Overall Cognitive Status: Impaired/Different from baseline Area of Impairment: Memory  Memory: Decreased short-term memory       Problem Solving: Slow processing;Requires verbal cues General Comments: per RN, wife had stated that pt has not been himself cognitively    General Comments       Exercises        Shoulder Instructions      Home Living Family/patient expects to be discharged to:: Private residence Living Arrangements: Spouse/significant other Available Help at Discharge: Family;Available 24 hours/day Type of Home: House Home Access: Stairs to enter [employer] of Steps: 2   Home Layout: One level     Bathroom Shower/Tub: Occupational psychologist: Standard     Home Equipment: Environmental consultant - 2 wheels;Grab bars - tub/shower          Prior Functioning/Environment Level of Independence: Needs assistance  Gait / Transfers Assistance Needed: has been falling at home, but before last admission in 4/16, was independent without AD ADL's / Homemaking Assistance Needed: wife assists with compression hose and starting pants over feet, pt reports showering in standing independently, wife performs cooking and cleaning        OT Diagnosis: Generalized weakness;Cognitive deficits   OT Problem List: Decreased strength;Decreased activity tolerance;Impaired balance (sitting and/or standing);Decreased knowledge of use of DME or AE;Decreased cognition   OT Treatment/Interventions: Self-care/ADL training;DME and/or AE instruction;Patient/family education;Balance training;Therapeutic activities    OT Goals(Current goals can be found in the care plan section) Acute Rehab OT Goals Patient Stated Goal: return home OT Goal Formulation: With patient Time For Goal Achievement: 10/26/14 Potential to Achieve Goals: Good ADL Goals Pt Will Perform Grooming: with modified independence;standing Pt Will Perform Lower Body Bathing: with modified independence;sit to/from stand Pt Will Perform Lower Body Dressing: with modified independence;sit to/from stand (except compression hose) Pt Will Transfer to Toilet: with modified independence;ambulating;regular height toilet Pt Will Perform Toileting - Clothing Manipulation and hygiene: with modified independence;sit to/from stand Pt Will  Perform Tub/Shower Transfer: Shower transfer;with supervision;ambulating;shower seat;rolling walker;grab bars  OT Frequency: Min 2X/week   Barriers to D/C:            Co-evaluation              End of Session Equipment Utilized During Treatment: Rolling walker;Gait belt  Activity Tolerance: Patient tolerated treatment well Patient left: in chair;with call bell/phone within reach   Time: 1315-1342 OT Time Calculation (min): 27 min Charges:  OT General Charges $OT Visit: 1 Procedure OT Evaluation $Initial OT Evaluation Tier I: 1 Procedure OT Treatments $Self Care/Home Management : 8-22 mins G-Codes:    Malka So 10/12/2014, 1:58 PM  [phone]

## 2014-10-12 NOTE — Progress Notes (Signed)
Advanced Heart Failure Rounding Note   Subjective:    No BMs overnight. Capsule endo planned for today. Weight up 5 pounds. Po diuretics restarted yesterday. Apixaban 2.5 bid also restarted. Hgb stable. CIR evaluating   Objective:   Weight Range:  Vital Signs:   Temp:  [97.7 F (36.5 C)-98.1 F (36.7 C)] 98.1 F (36.7 C) (05/19 0757) Pulse Rate:  [51-72] 57 (05/19 0757) Resp:  [11-25] 11 (05/19 0757) BP: (86-138)/(31-52) 99/35 mmHg (05/19 0428) SpO2:  [92 %-100 %] 100 % (05/19 0757) Weight:  [68.811 kg (151 lb 11.2 oz)] 68.811 kg (151 lb 11.2 oz) (05/19 0428) Last BM Date: 10/09/14  Weight change: Filed Weights   10/10/14 0200 10/11/14 0500 10/12/14 0428  Weight: 66.2 kg (145 lb 15.1 oz) 66.316 kg (146 lb 3.2 oz) 68.811 kg (151 lb 11.2 oz)    Intake/Output:   Intake/Output Summary (Last 24 hours) at 10/12/14 0850 Last data filed at 10/12/14 0700  Gross per 24 hour  Intake    630 ml  Output   2650 ml  Net  -2020 ml     Physical Exam: General:  Chronically ill  appearing. No resp difficulty HEENT: normal Neck: supple. JVP ~9-10  . Carotids 2+ bilat; no bruits. No lymphadenopathy or thryomegaly appreciated. Cor: PMI nondisplaced. Regular rate & rhythm. No rubs, gallops or murmurs. Lungs: Decreased  Abdomen: soft, nontender, nondistended. No hepatosplenomegaly. No bruits or masses. Good bowel sounds. Extremities: no cyanosis, clubbing, rash,  R and LLE 1-2+ edema SCDs.  Neuro: alert & orientedx3, cranial nerves grossly intact. moves all 4 extremities w/o difficulty. Affect pleasant  Telemetry: Sinus Brady 50s.  Labs: Basic Metabolic Panel:  Recent Labs Lab 10/09/14 2237 10/10/14 0529 10/11/14 0238 10/12/14 0305  NA 125* 126* 126* 127*  K 2.6* 2.9* 4.4 4.0  CL 78* 86* 89* 90*  CO2 33* 32 29 29  GLUCOSE 99 123* 129* 134*  BUN 54* 48* 40* 35*  CREATININE 2.31* 2.02* 1.85* 1.78*  CALCIUM 8.3* 7.8* 8.1* 7.8*  MG  --  1.5*  --  1.6*    Liver Function  Tests:  Recent Labs Lab 10/09/14 2237 10/10/14 0529 10/11/14 0238  AST 81* 75* 77*  ALT 22 21 21   ALKPHOS 90 82 84  BILITOT 1.2 2.6* 1.4*  PROT 5.8* 5.0* 5.3*  ALBUMIN 2.4* 2.1* 2.1*   No results for input(s): LIPASE, AMYLASE in the last 168 hours. No results for input(s): AMMONIA in the last 168 hours.  CBC:  Recent Labs Lab 10/09/14 2237 10/10/14 0529 10/11/14 0238 10/12/14 0305  WBC 9.8 12.6* 10.5 8.6  NEUTROABS 6.5 9.1*  --   --   HGB 7.2* 8.1* 8.0* 8.2*  HCT 20.8* 23.4* 23.9* 24.6*  MCV 89.3 88.3 89.5 90.8  PLT 125* 122* 137* 120*    Cardiac Enzymes: No results for input(s): CKTOTAL, CKMB, CKMBINDEX, TROPONINI in the last 168 hours.  BNP: BNP (last 3 results)  Recent Labs  08/30/14 1639 09/09/14 1150 10/09/14 2237  BNP 800.7* 550.7* 715.8*    ProBNP (last 3 results)  Recent Labs  01/24/14 1909 02/09/14 0945  PROBNP 6721.0* 2555.0*      Other results:  Imaging: No results found.   Medications:     Scheduled Medications: . amiodarone  200 mg Oral Daily  . apixaban  2.5 mg Oral BID  . budesonide (PULMICORT) nebulizer solution  0.25 mg Nebulization BID  . carvedilol  3.125 mg Oral BID WC  . ceFEPime (MAXIPIME) IV  1 g Intravenous Q24H  . folic acid  1 mg Oral Daily  . guaiFENesin  600 mg Oral BID  . hydrALAZINE  25 mg Oral TID  . insulin aspart  0-9 Units Subcutaneous TID WC  . insulin glargine  10 Units Subcutaneous QHS  . isosorbide mononitrate  30 mg Oral Daily  . levothyroxine  75 mcg Oral QAC breakfast  . magnesium sulfate 1 - 4 g bolus IVPB  2 g Intravenous Once  . pantoprazole  40 mg Oral Q0600  . sodium chloride  3 mL Intravenous Q12H  . thiamine  100 mg Oral Daily  . tiotropium  18 mcg Inhalation Daily  . torsemide  40 mg Oral Daily  . vancomycin  750 mg Intravenous Q24H    Infusions:    PRN Medications: acetaminophen **OR** acetaminophen, ondansetron **OR** ondansetron (ZOFRAN) IV   Assessment:   1. Falls   2. Anemia on admit 7.2>8.1  3. Chronic Systolic HF- ICM- ECHO 8/0/1655 EF 40-45%  4. PAF on chronic amiodarone and eliquis 5. DM 6. H/O VT  7. Anemia  8. Chronic Respiratory Failure- on 2 liters [place] at home  9. Hypothroid 10. HCAP 11. CKD- Creatinine baseline 1.8-2.0  12. Hyponatremia - 125 on admit   Plan/Discussion:    HF stable. Hgb stable back on apixaban 2.5 bid. For capsule endo today. Continue po torsemide.  AppreciaterCIR eval.   Length of Stay: 2   Tedi Hughson MD  10/12/2014, 8:50 AM  Advanced Heart Failure Team Pager [phone] (M-F; [place])  Please contact [facility] for night-coverage after hours (4p -7a ) and weekends on [email or web address]

## 2014-10-12 NOTE — PMR Pre-admission (Signed)
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 Date: A: 07-25-02; B: 08-24-04     Deduct: $1288      Out of Pocket Max: none       Life Max: unlimited CIR: 100%      SNF: 100% days 1-20; 80% days 21-100 (100 days max) Outpatient: 80/20%     Co-Pay: none, no visit limits Home Health: 100%      Co-Pay: none DME: 80/20%     Co-Pay: none Providers: pt's preference  SECONDARY: AARP Supplement      Policy#: [id number]      Subscriber: self Benefits:  Phone #: [phone]       Emergency Contact Information Contact Information    Name Relation Home Work Mobile   [place] Spouse [phone]  [phone]   [facility] Daughter   [phone]     Current Medical History  Patient Admitting Diagnosis: debility due to PNA/CHF and multiple medical  History of Present Illness: [name] is a 79 y.o. male with history of CAD, VT/ICD, DM type 2, COPD-oxygen HS,  chronic systolic CHF-milrinone, recent admission 08/2014 for GIB, candida esophagitis and PNA who was readmitted 10/09/14 with worsening of weakness, increasing DOE and falls. CT head without acute changes. Patient was noted to be anemic with Hgb 7.2, positive FOBT and CXR showed evidence of volume overload and PNA. He was transfused with 1 units PRBC and started on IV anbiotics for PNA. Dr. Haroldine Laws consulted for input on chronic AC held and diuretics resumed to help with fluid overload.  Dr. Henrene Pastor consulted for input on colonoscopy and expressed concerns as prior attempts at colonoscopy unsuccessful due to sigmoid stenosis. He recommended capsule endoscopy for work up with transfusion as indicated and resume AC if H/H  stable.   Swallow evaluation done and negative for signs of dysphagia.  PT evaluation done yesterday and CIR recommended due to deconditioned state.   Past Medical History  Past Medical History  Diagnosis Date  . VENTRICULAR TACHYCARDIA   . PERIPHERAL VASCULAR DISEASE   . HYPERTENSION     Hypertensive retinopathy-grade II OU  . HYPERCHOLESTEROLEMIA   . CORONARY ARTERY DISEASE     Hx of CABG  . Chronic systolic heart failure     Ischemic cardiomyopathy (01/2014 EF 40-45% with hypokinesis + small area of akinesis  . SEBORRHEIC DERMATITIS   . Chronic renal insufficiency, stage III (moderate)     CrCl 40s-50s  . DIVERTICULOSIS OF COLON   . COPD, severe   . COLONIC POLYPS   . ANXIETY   . ICD (implantable cardiac defibrillator) in place   . Type II diabetes mellitus     +background diabetic retinopathy OU  . Chronic lower back pain   . DJD (degenerative joint disease)     "hands" (05/24/2012)  . Osteoarthritis of finger   . History of atrial flutter   . Thrombus 12/2013    on ICD lead--started anticoag and his cardioversion for atrial flutter was postponed.  . Colon stricture 2015    with features worrisome for mass, also with recent rising CEA  level (possible colon cancer)-GI MD= Dr. Franki Cabot recent eval/follow up with Dr. Earlean Shawl was summer 2014, at which time he recommended colonoscopy but pt declined.  . Macular degeneration, age related, nonexudative     OU    Family History  family history includes Heart attack in his brother; Heart attack (age of onset: 26) in his father.  Prior Rehab/Hospitalizations: pt had recent home health therapy for general weakness in the past three months.   Current Medications   Current facility-administered medications:  .  acetaminophen (TYLENOL) tablet 650 mg, 650 mg, Oral, Q6H PRN **OR** acetaminophen (TYLENOL) suppository 650 mg, 650 mg, Rectal, Q6H PRN, Rise Patience, MD .  amiodarone (PACERONE) tablet 200 mg, 200 mg, Oral, Daily,  Rise Patience, MD, 200 mg at 10/13/14 1008 .  apixaban (ELIQUIS) tablet 2.5 mg, 2.5 mg, Oral, BID, Jolaine Artist, MD, 2.5 mg at 10/13/14 1006 .  budesonide (PULMICORT) nebulizer solution 0.25 mg, 0.25 mg, Nebulization, BID, Barton Dubois, MD, 0.25 mg at 10/13/14 0915 .  carvedilol (COREG) tablet 3.125 mg, 3.125 mg, Oral, BID WC, Rise Patience, MD, 3.125 mg at 10/13/14 1230 .  ceFEPIme (MAXIPIME) 1 g in dextrose 5 % 50 mL IVPB, 1 g, Intravenous, Q24H, Erenest Blank, RPH, 1 g at 10/13/14 1215 .  folic acid (FOLVITE) tablet 1 mg, 1 mg, Oral, Daily, Rise Patience, MD, 1 mg at 10/13/14 1009 .  guaiFENesin (MUCINEX) 12 hr tablet 600 mg, 600 mg, Oral, BID, Barton Dubois, MD, 600 mg at 10/13/14 1009 .  hydrALAZINE (APRESOLINE) tablet 25 mg, 25 mg, Oral, TID, Rise Patience, MD, 25 mg at 10/13/14 1230 .  insulin aspart (novoLOG) injection 0-9 Units, 0-9 Units, Subcutaneous, TID WC, Rise Patience, MD, 2 Units at 10/13/14 1216 .  insulin glargine (LANTUS) injection 10 Units, 10 Units, Subcutaneous, QHS, Rise Patience, MD, 10 Units at 10/12/14 2140 .  isosorbide mononitrate (IMDUR) 24 hr tablet 30 mg, 30 mg, Oral, Daily, Rise Patience, MD, 30 mg at 10/13/14 1230 .  levothyroxine (SYNTHROID, LEVOTHROID) tablet 75 mcg, 75 mcg, Oral, QAC breakfast, Rise Patience, MD, 75 mcg at 10/13/14 1006 .  ondansetron (ZOFRAN) tablet 4 mg, 4 mg, Oral, Q6H PRN **OR** ondansetron (ZOFRAN) injection 4 mg, 4 mg, Intravenous, Q6H PRN, Rise Patience, MD .  pantoprazole (PROTONIX) EC tablet 40 mg, 40 mg, Oral, Q0600, Charlynne Cousins Gribbin, PA-C, 40 mg at 10/13/14 0541 .  potassium chloride SA (K-DUR,KLOR-CON) CR tablet 40 mEq, 40 mEq, Oral, Once, Barton Dubois, MD .  pravastatin (PRAVACHOL) tablet 80 mg, 80 mg, Oral, q1800, Shaune Pascal Bensimhon, MD .  sodium chloride 0.9 % injection 3 mL, 3 mL, Intravenous, Q12H, Rise Patience, MD, 3 mL at 10/13/14 1010 .  thiamine (VITAMIN  B-1) tablet 100 mg, 100 mg, Oral, Daily, Rise Patience, MD, 100 mg at 10/13/14 1006 .  tiotropium (SPIRIVA) inhalation capsule 18 mcg, 18 mcg, Inhalation, Daily, Rise Patience, MD, 18 mcg at 10/13/14 0916 .  torsemide (DEMADEX) tablet 40 mg, 40 mg, Oral, Daily, Barton Dubois, MD, 40 mg at 10/13/14 1009 .  vancomycin (VANCOCIN) IVPB 750 mg/150 ml premix, 750 mg, Intravenous, Q24H, Erenest Blank, RPH, 750 mg at 10/12/14 2140  Patients Current Diet: Diet heart healthy/carb modified Room service appropriate?: Yes; Fluid consistency:: Thin  Precautions / Restrictions Precautions Precautions: Fall Precaution Comments: Multiple falls pta. Restrictions Weight Bearing Restrictions: No   Prior Activity Level Community (5-7x/wk): Pt gets out  everyday with his wife but she does the driving and he has been staying in the car the past few weeks. He has been feeling weaker and his LE's get weak to the point that he needs to sit down. This has limited his community mobility. He enjoys his toy poodle and being with his dtrs/grand-children.   Home Assistive Devices / Equipment Home Assistive Devices/Equipment: Environmental consultant (specify type) (walker with wheels) Home Equipment: Walker - 2 wheels, Grab bars - tub/shower  Prior Functional Level Prior Function Level of Independence: Needs assistance Gait / Transfers Assistance Needed: has been falling at home, but before last admission in 4/16, was independent without AD ADL's / Homemaking Assistance Needed: wife assists with compression hose and starting pants over feet, pt reports showering in standing independently, wife performs cooking and cleaning Comments: pt somewhat lethargic and did not offer much information on eval and questioned validity of some answers  Current Functional Level Cognition  Overall Cognitive Status: Impaired/Different from baseline Orientation Level: Oriented X4 Following Commands: Follows one step commands consistently,  Follows one step commands with increased time, Follows multi-step commands with increased time Safety/Judgement: Decreased awareness of safety, Decreased awareness of deficits General Comments: per RN, wife had stated that pt has not been himself cognitively.  Patient looking for "itch cream" in his bags.  He looked twice in each bag, unable to locate.  PT located in one of the bags.    Extremity Assessment (includes Sensation/Coordination)  Upper Extremity Assessment: Generalized weakness  Lower Extremity Assessment: Generalized weakness (tendency for LEs to buckle) RLE Deficits / Details: hip flex 3+/5, knee flex/ ext 3+/5 LLE Deficits / Details: hip flex 3+/5, knee flex/ ext 3+/5. Bilateral LE's fatigue quickly in [place]    ADLs  Overall ADL's : Needs assistance/impaired Eating/Feeding: Independent, Sitting Eating/Feeding Details (indicate cue type and reason): opened packaging independently Grooming: Wash/dry hands, Sitting, Set up Upper Body Bathing: Supervision/ safety, Sitting Lower Body Bathing: Sit to/from stand, Moderate assistance Upper Body Dressing : Minimal assistance, Sitting Upper Body Dressing Details (indicate cue type and reason): front opening gown Lower Body Dressing: Sit to/from stand, Moderate assistance Toilet Transfer: Minimal assistance, Stand-pivot, RW Toilet Transfer Details (indicate cue type and reason): simulated bed to chair Toileting- Clothing Manipulation and Hygiene: Sit to/from stand, Moderate assistance    Mobility  Overal bed mobility: Needs Assistance Bed Mobility: Supine to Sit, Sit to Supine Supine to sit: Min guard Sit to supine: Min guard General bed mobility comments: no physical assist, used rail, extra time    Transfers  Overall transfer level: Needs assistance Equipment used: Rolling walker (2 wheeled) Transfers: Sit to/from Stand Sit to Stand: Min assist Stand pivot transfers: Min assist General transfer comment: cues for hand  placement, steadying assist    Ambulation / Gait / Stairs / Wheelchair Mobility  Ambulation/Gait Ambulation/Gait assistance: Min assist Ambulation Distance (Feet): 90 Feet Assistive device: Rolling walker (2 wheeled) General Gait Details: Very slow, guarded gait with RW.  Assist due to unsteady gait.  Patient fatigues quickly, requiring 2 standing rest breaks for 90'. Gait Pattern/deviations: Step-through pattern, Decreased stride length, Trunk flexed Gait velocity: decreased Gait velocity interpretation: Below normal speed for age/gender    Posture / Balance Balance Overall balance assessment: Needs assistance Sitting-balance support: No upper extremity supported, Feet supported Sitting balance-Leahy Scale: Fair Standing balance support: Bilateral upper extremity supported Standing balance-Leahy Scale: Poor Standing balance comment: can release one hand from walker in standing, but feels uncomfortable due to LE weakness  Special needs/care consideration BiPAP/CPAP no CPM no  Continuous Drip IV no  Dialysis no          Life Vest no  Oxygen no Special Bed no Trach Size no Wound Vac (area) no        Skin - pt with fragile skin, bruises easily and has bruises on L UE and hand                        Bowel mgmt: last BM on 10-13-14 Bladder mgmt: currently using urinal Diabetic mgmt - managed at home with insulin   Previous Home Environment Living Arrangements: Spouse/significant other Available Help at Discharge: Family, Available 24 hours/day Type of Home: House Home Layout: One level Home Access: Stairs to enter [employer] of Steps: 2 Bathroom Shower/Tub: Multimedia programmer: Standard Home Care Services: Yes  Discharge Living Setting Plans for Discharge Living Setting: Patient's home (pt states they are building a 1 level home in [place] as well.) Type of Home at Discharge: House Discharge Home Layout: One level Discharge Home Access: Stairs to  enter Technical brewer of Steps: 2 Discharge Bathroom Shower/Tub: Walk-in shower Discharge Bathroom Toilet: Standard Does the patient have any problems obtaining your medications?: No  Social/Family/Support Systems Patient Roles: Spouse (enjoys being with his grandchildren) Sport and exercise psychologist Information: wife Mardene Celeste is Audiological scientist Anticipated Caregiver: wife (and two dtrs are local and supportive as well) Anticipated Caregiver's Contact Information: see above Ability/Limitations of Caregiver: no limitations Caregiver Availability: 24/7 Discharge Plan Discussed with Primary Caregiver: Yes Is Caregiver In Agreement with Plan?: Yes Does Caregiver/Family have Issues with Lodging/Transportation while Pt is in Rehab?: No  Goals/Additional Needs Patient/Family Goal for Rehab: Mod Ind and Supervision with PT, Mod Ind and Supervision and Min assist with OT, NA for SLP Expected length of stay: 12-18 days Cultural Considerations: none Equipment Needs: to be determined Pt/Family Agrees to Admission and willing to participate: Yes (spoke with pt's wife by phone on 10-12-14) Program Orientation Provided & Reviewed with Pt/Caregiver Including Roles  & Responsibilities: Yes   Decrease burden of Care through IP rehab admission: NA  Possible need for SNF placement upon discharge: not anticipated  Patient Condition: This patient's condition remains as documented in the consult dated 10-12-14, in which the Rehabilitation Physician determined and documented that the patient's condition is appropriate for intensive rehabilitative care in an inpatient rehabilitation facility. Will admit to inpatient rehab today.  Preadmission Screen Completed By:  Nanetta Batty, PT, 10/13/2014 2:05 PM ______________________________________________________________________   Discussed status with Dr. Naaman Plummer on 10-13-14 at 1403 and received telephone approval for admission today.  Admission Coordinator: Nanetta Batty, PT, time1403/Date 10-13-14

## 2014-10-12 NOTE — Consult Note (Signed)
Physical Medicine and Rehabilitation Consult  Reason for Consult: Gait disorder Referring Physician:  Dr. Dyann Kief   HPI: [name] is a 79 y.o. male with history of CAD, VT/ICD, DM type 2, COPD-oxygen HS,  chronic systolic CHF-milrinone, recent admission 08/2014 for GIB, candida esophagitis and PNA who was readmitted 10/09/14 with worsening of weakness, increasing DOE and falls. CT head without acute changes. Patient was noted to be anemic with Hgb 7.2, positive FOBT and CXR showed evidence of volume overload and PNA. He was transfused with 1 units PRBC and started on IV anbiotics for PNA. Dr. Haroldine Laws consulted for input on chronic AC held and diuretics resumed to help with fluid overload.  Dr. Henrene Pastor consulted for input on colonoscopy and expressed concerns as prior attempts at colonoscopy unsuccessful due to sigmoid stenosis. He recommended capsule endoscopy for work up with transfusion as indicated and resume AC if H/H stable.   Swallow evaluation done and negative for signs of dysphagia.  PT evaluation done yesterday and CIR recommended due to deconditioned state.    Review of Systems  HENT: Negative for hearing loss.   Eyes: Negative for blurred vision and double vision.       Needs cataracts surgery  Respiratory: Positive for cough and shortness of breath.   Cardiovascular: Negative for chest pain and palpitations.  Gastrointestinal: Negative for heartburn and abdominal pain.  Musculoskeletal: Negative for myalgias and back pain.  Neurological: Negative for focal weakness and headaches.      Past Medical History  Diagnosis Date  . VENTRICULAR TACHYCARDIA   . PERIPHERAL VASCULAR DISEASE   . HYPERTENSION     Hypertensive retinopathy-grade II OU  . HYPERCHOLESTEROLEMIA   . CORONARY ARTERY DISEASE     Hx of CABG  . Chronic systolic heart failure     Ischemic cardiomyopathy (01/2014 EF 40-45% with hypokinesis + small area of akinesis  . SEBORRHEIC DERMATITIS   .  Chronic renal insufficiency, stage III (moderate)     CrCl 40s-50s  . DIVERTICULOSIS OF COLON   . COPD, severe   . COLONIC POLYPS   . ANXIETY   . ICD (implantable cardiac defibrillator) in place   . Type II diabetes mellitus     +background diabetic retinopathy OU  . Chronic lower back pain   . DJD (degenerative joint disease)     "hands" (05/24/2012)  . Osteoarthritis of finger   . History of atrial flutter   . Thrombus 12/2013    on ICD lead--started anticoag and his cardioversion for atrial flutter was postponed.  . Colon stricture 2015    with features worrisome for mass, also with recent rising CEA level (possible colon cancer)-GI MD= Dr. Franki Cabot recent eval/follow up with Dr. Earlean Shawl was summer 2014, at which time he recommended colonoscopy but pt declined.  . Macular degeneration, age related, nonexudative     OU    Past Surgical History  Procedure Laterality Date  . Coronary artery bypass graft  1990's    CABG X4  . Tonsillectomy  ?1942  . Cardiac defibrillator placement  12/2004    single chamber defibrillator- Medtronic Maxima 8/06 DrKlein [Other][  . Tee without cardioversion N/A 12/22/2013    Procedure: TRANSESOPHAGEAL ECHOCARDIOGRAM (TEE);  Surgeon: Thayer Headings, MD;  Location: [place];  Service: Cardiovascular;  Laterality: N/A;  . Cardioversion N/A 12/22/2013    Procedure: CARDIOVERSION;  Surgeon: Thayer Headings, MD;  Location: [place];  Service: Cardiovascular;  Laterality: N/A;  .  Cardiovascular stress test  01/12/13    myocard perf imaging: previous infarct with a moderately reduced EF as was known previously.  No new findings.   . Gastric emptying scan  02/02/14    Normal  . Transthoracic echocardiogram  01/25/14; 04/2014    EF 40-45%, diffuse hypokinesis, infero-basilar myocardial akinesis, PA pressure slightly high, valves fine.04/2014-Echo today EF ~40% inferior AK moderate to severe MR  . Abdominal ultrasound  01/2014    GB sludge, o/w normal    . Ct virtual colonoscopy diagnostic  11/2012    Asymmetric thickening of the rectosigmoid colon worrisome for  . Cardiovascular stress test      Lexiscan: There is a medium sized fixed inferior wall defect of moderate severity involving the apical inferior and mid inferior and basal inferoseptal segments, consistent with old inferior MI. No significant reversible ischemia.  EF 34%, inferior wall hypokinesis--intermediate risk scan (ok to do planned procedure)  . Implantable cardioverter defibrillator revision N/A 05/25/2012    Procedure: IMPLANTABLE CARDIOVERTER DEFIBRILLATOR REVISION;  Surgeon: Evans Lance, MD;  Location: [facility] CATH LAB;  Service: Cardiovascular;  Laterality: N/A;  . Right heart catheterization N/A 08/31/2014    Procedure: RIGHT HEART CATH;  Surgeon: Larey Dresser, MD;  Location: [facility] CATH LAB;  Service: Cardiovascular;  Laterality: N/A;  . Esophagogastroduodenoscopy N/A 09/03/2014    Procedure: ESOPHAGOGASTRODUODENOSCOPY (EGD);  Surgeon: Carol Ada, MD;  [facility];  Service: Endoscopy;  Laterality: N/A;   Family History  Problem Relation Age of Onset  . Heart attack Father 58  . Heart attack Brother     Vague history    Social History:  Married. 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 Independent without AD prior to 08/2014 admission. He  reports that he quit smoking about 14 years ago. His smoking use included Cigarettes. He has a 100 pack-year smoking history. He has never used smokeless tobacco. He reports that he drinks a beer daily. He reports that he does not use illicit drugs.    Allergies  Allergen Reactions  . Niacin Itching    Niaspan     Medications Prior to Admission  Medication Sig Dispense Refill  . amiodarone (PACERONE) 200 MG tablet Take 1 tablet (200 mg total) by mouth daily. 30 tablet 0  . apixaban (ELIQUIS) 5 MG TABS tablet Take 1 tablet (5 mg total) by mouth 2 (two) times daily. 60 tablet 0  . carvedilol (COREG)  3.125 MG tablet Take 1 tablet (3.125 mg total) by mouth 2 (two) times daily with a meal. 60 tablet 0  . chlorhexidine (PERIDEX) 0.12 % solution Use as directed 5 mLs in the mouth or throat 2 (two) times daily.     . fexofenadine (ALLEGRA) 180 MG tablet Take 180 mg by mouth daily as needed for allergies or rhinitis.    . Fluticasone-Salmeterol (ADVAIR) 500-50 MCG/DOSE AEPB Inhale 1 puff into the lungs 2 (two) times daily. 60 each 11  . folic acid (FOLVITE) 1 MG tablet Take 1 tablet (1 mg total) by mouth daily. 30 tablet 0  . hydrALAZINE (APRESOLINE) 25 MG tablet Take 25 mg by mouth 3 (three) times daily.    . Insulin Glargine (LANTUS SOLOSTAR) 100 UNIT/ML Solostar Pen 10 units SQ qhs (Patient taking differently: Inject 10 Units into the skin at bedtime. ) 15 mL 11  . insulin lispro (HUMALOG KWIKPEN) 100 UNIT/ML KiwkPen Take 7 units in the morning, 8 units at noon, and 9 units at bedtime. (Patient taking differently: Inject  into the skin. Take 7 units in the morning, 8 units at noon, and 9 units at dinner.) 5 pen 3  . isosorbide mononitrate (IMDUR) 30 MG 24 hr tablet TAKE ONE TABLET BY MOUTH DAILY 30 tablet 0  . levofloxacin (LEVAQUIN) 750 MG tablet Take 1 tablet (750 mg total) by mouth every other day. 3 tablet 0  . levothyroxine (SYNTHROID, LEVOTHROID) 75 MCG tablet Take 1 tablet (75 mcg total) by mouth daily. 30 tablet 3  . metolazone (ZAROXOLYN) 2.5 MG tablet Take on mondays and Fridays (Patient taking differently: Take 2.5 mg by mouth 2 (two) times daily. Take on mondays and Fridays) 60 tablet 3  . pantoprazole (PROTONIX) 40 MG tablet Take 1 tablet (40 mg total) by mouth 2 (two) times daily. 60 tablet 6  . potassium chloride SA (KLOR-CON M20) 20 MEQ tablet Take 1 tablet (20 mEq total) by mouth 2 (two) times daily. 90 tablet 3  . SPIRIVA HANDIHALER 18 MCG inhalation capsule PLACE ONE CAPSULE INTO INHALER AND  INHALE  DAILY (Patient taking differently: PLACE ONE CAPSULE INTO INHALER AND  every  evening.) 30 capsule 0  . thiamine 100 MG tablet Take 1 tablet (100 mg total) by mouth daily. 30 tablet 0  . tobramycin (TOBREX) 0.3 % ophthalmic solution Place 2 drops into both eyes every 6 (six) hours. 5 mL 0  . torsemide (DEMADEX) 20 MG tablet Take 3 tablets (60 mg total) by mouth daily. (Patient taking differently: Take 20 mg by mouth 2 (two) times daily. ) 30 tablet 0  . pravastatin (PRAVACHOL) 80 MG tablet Take 80 mg by mouth at bedtime.       Home: Home Living Family/patient expects to be discharged to:: Private residence Living Arrangements: Spouse/significant other Available Help at Discharge: Family, Available 24 hours/day Type of Home: House Home Access: Stairs to enter [employer] of Steps: 2 Home Layout: One level Home Equipment: Environmental consultant - 2 wheels  Functional History: Prior Function Level of Independence: Needs assistance Gait / Transfers Assistance Needed: has been falling at home, but before last admission in 4/16, was independent without AD ADL's / Homemaking Assistance Needed: wife has been assisting Comments: pt somewhat lethargic and did not offer much information on eval and questioned validity of some answers Functional Status:  Mobility: Bed Mobility Overal bed mobility: Needs Assistance Bed Mobility: Supine to Sit Supine to sit: Mod assist General bed mobility comments: needed verbal and tactile cues to initiate mvmt and then required mod A for legs off bed and trunk elevation Transfers Overall transfer level: Needs assistance Equipment used: Rolling walker (2 wheeled) Transfers: Sit to/from Stand, Stand Pivot Transfers Sit to Stand: +2 physical assistance, Mod assist, Min assist, +2 safety/equipment Stand pivot transfers: Min assist, +2 physical assistance General transfer comment: first sit to stand, pt required +2 mod A for power up and difficulty getting weight fwd. Once up, min A and +2 for safety and equipment. Second sit to stand from  recliner, pt able to get up with min A.  Ambulation/Gait Ambulation/Gait assistance: Min assist, +2 safety/equipment Ambulation Distance (Feet): 70 Feet Assistive device: Rolling walker (2 wheeled) General Gait Details: O2 sats 89-90% on RA, 2 standing rest breaks, pt moving very slowly with flexed gait despite vc's.  Gait Pattern/deviations: Step-through pattern, Decreased stride length, Shuffle, Trunk flexed Gait velocity: decreased Gait velocity interpretation: <1.8 ft/sec, indicative of risk for recurrent falls    ADL:    Cognition: Cognition Overall Cognitive Status: Impaired/Different from baseline Orientation Level:  Oriented X4 Cognition Arousal/Alertness: Lethargic Behavior During Therapy: WFL for tasks assessed/performed, Flat affect Overall Cognitive Status: Impaired/Different from baseline Area of Impairment: Memory, Following commands, Safety/judgement, Problem solving Memory: Decreased short-term memory Following Commands: Follows one step commands consistently, Follows one step commands with increased time, Follows multi-step commands with increased time Safety/Judgement: Decreased awareness of safety, Decreased awareness of deficits Problem Solving: Slow processing, Requires verbal cues General Comments: pt appears somewhat confused today and flat affect  Blood pressure 99/35, pulse 57, temperature 98.1 F (36.7 C), temperature source Oral, resp. rate 11, height 5\' 7"  (1.702 m), weight 68.811 kg (151 lb 11.2 oz), SpO2 100 %. Physical Exam  Nursing note and vitals reviewed. Constitutional: He is oriented to person, place, and time. He appears well-developed. He has a sickly appearance. Nasal cannula in place.  HENT:  Head: Normocephalic and atraumatic.  Eyes: Conjunctivae are normal. Pupils are equal, round, and reactive to light.  Neck: Neck supple.  Cardiovascular: Normal rate and regular rhythm.   Respiratory: Effort normal. No respiratory distress. He has  decreased breath sounds in the right lower field and the left lower field. He has wheezes.  Weak congested cough noted.   GI: Soft. Bowel sounds are normal. He exhibits no distension. There is no tenderness.  Musculoskeletal: He exhibits edema (Min edema BUE with multiple ecchymotic areas.). He exhibits no tenderness.  Neurological: He is alert and oriented to person, place, and time.  Speech clear. Follows basic commands without difficulty. Decreased sensation R-foot.  UE: 4/5 prox to distal. LE: 2/5hf, 3+ke and 3+ adf/paf. Cognitively appropriate  Skin: Skin is warm and dry.    Results for orders placed or performed during the hospital encounter of 10/09/14 (from the past 24 hour(s))  Glucose, capillary     Status: Abnormal   Collection Time: 10/11/14 11:28 AM  Result Value Ref Range   Glucose-Capillary 147 (H) 65 - 99 mg/dL   Comment 1 Capillary Specimen   Glucose, capillary     Status: Abnormal   Collection Time: 10/11/14  5:10 PM  Result Value Ref Range   Glucose-Capillary 180 (H) 65 - 99 mg/dL   Comment 1 Capillary Specimen   Glucose, capillary     Status: Abnormal   Collection Time: 10/11/14  9:12 PM  Result Value Ref Range   Glucose-Capillary 161 (H) 65 - 99 mg/dL   Comment 1 Capillary Specimen   Basic metabolic panel     Status: Abnormal   Collection Time: 10/12/14  3:05 AM  Result Value Ref Range   Sodium 127 (L) 135 - 145 mmol/L   Potassium 4.0 3.5 - 5.1 mmol/L   Chloride 90 (L) 101 - 111 mmol/L   CO2 29 22 - 32 mmol/L   Glucose, Bld 134 (H) 65 - 99 mg/dL   BUN 35 (H) 6 - 20 mg/dL   Creatinine, Ser 1.78 (H) 0.61 - 1.24 mg/dL   Calcium 7.8 (L) 8.9 - 10.3 mg/dL   GFR calc non Af Amer 35 (L) >60 mL/min   GFR calc Af Amer 41 (L) >60 mL/min   Anion gap 8 5 - 15  CBC     Status: Abnormal   Collection Time: 10/12/14  3:05 AM  Result Value Ref Range   WBC 8.6 4.0 - 10.5 K/uL   RBC 2.71 (L) 4.22 - 5.81 MIL/uL   Hemoglobin 8.2 (L) 13.0 - 17.0 g/dL   HCT 24.6 (L) 39.0 -  52.0 %   MCV 90.8 78.0 - 100.0  fL   MCH 30.3 26.0 - 34.0 pg   MCHC 33.3 30.0 - 36.0 g/dL   RDW 18.1 (H) 11.5 - 15.5 %   Platelets 120 (L) 150 - 400 K/uL  Magnesium     Status: Abnormal   Collection Time: 10/12/14  3:05 AM  Result Value Ref Range   Magnesium 1.6 (L) 1.7 - 2.4 mg/dL   No results found.  Assessment/Plan: Diagnosis: debility due to PNA/CHF and multiple medical 1. Does the need for close, 24 hr/day medical supervision in concert with the patient's rehab needs make it unreasonable for this patient to be served in a less intensive setting? Yes 2. Co-Morbidities requiring supervision/potential complications: GIB, COPD, CKD 3. Due to bladder management, bowel management, safety, skin/wound care, disease management, medication administration, pain management and patient education, does the patient require 24 hr/day rehab nursing? Yes 4. Does the patient require coordinated care of a physician, rehab nurse, PT (1-2 hrs/day, 5 days/week) and OT (1-2 hrs/day, 5 days/week) to address physical and functional deficits in the context of the above medical diagnosis(es)? Yes Addressing deficits in the following areas: balance, endurance, locomotion, strength, transferring, bowel/bladder control, bathing, dressing, feeding, grooming, toileting and psychosocial support 5. Can the patient actively participate in an intensive therapy program of at least 3 hrs of therapy per day at least 5 days per week? Yes 6. The potential for patient to make measurable gains while on inpatient rehab is excellent 7. Anticipated functional outcomes upon discharge from inpatient rehab are modified independent and supervision  with PT, modified independent, supervision and min assist with OT, n/a with SLP. 8. Estimated rehab length of stay to reach the above functional goals is: 12-18 days 9. Does the patient have adequate social supports and living environment to accommodate these discharge functional goals?  Yes 10. Anticipated D/C setting: Home 11. Anticipated post D/C treatments: HH therapy and Outpatient therapy 12. Overall Rehab/Functional Prognosis: excellent  RECOMMENDATIONS: This patient's condition is appropriate for continued rehabilitative care in the following setting: CIR Patient has agreed to participate in recommended program. Yes Note that insurance prior authorization may be required for reimbursement for recommended care.  Comment: Rehab Admissions Coordinator to follow up.  Thanks,  Meredith Staggers, MD, Mellody Drown     10/12/2014

## 2014-10-12 NOTE — Progress Notes (Signed)
Dr. Haroldine Laws notified of blood pressures 90s over 28-32 with mean pressures in low 50s.  To hold blood pressure related medications for this dose, and orders will be written to give paramaters for holding medications.  Will continue to monitor pt closely.

## 2014-10-12 NOTE — Progress Notes (Signed)
Rehab admissions - I met with pt in follow up to rehab MD consult to give more details about our rehab program. Further questions were answered and informational brochures were given. Pt is interested in pursuing inpatient rehab. I also called pt's wife and shared this information as well. She is in support of pt coming to CIR when he is medically ready. Wife was concerned about pt's low BP and finding the reasons why he has been sick.  I will follow up on pt's status tomorrow and we will consider possible inpatient rehab admit pending his completed work up (capsule endoscopy is in process now) and medical clearance.  Please call me with any questions. Thanks.  Nanetta Batty, PT Rehabilitation Admissions Coordinator [phone]

## 2014-10-13 ENCOUNTER — Inpatient Hospital Stay (HOSPITAL_COMMUNITY)
Admission: RE | Admit: 2014-10-13 | Discharge: 2014-10-18 | DRG: 947 | Disposition: A | Discharge status: Home / Self Care | Payer: Medicare Other | Source: Intra-hospital | Attending: Physical Medicine & Rehabilitation | Admitting: Physical Medicine & Rehabilitation

## 2014-10-13 ENCOUNTER — Encounter: Payer: Self-pay | Admitting: Internal Medicine

## 2014-10-13 ENCOUNTER — Encounter (HOSPITAL_COMMUNITY): Payer: Self-pay | Admitting: Internal Medicine

## 2014-10-13 DIAGNOSIS — E039 Hypothyroidism, unspecified: Secondary | ICD-10-CM

## 2014-10-13 DIAGNOSIS — E1165 Type 2 diabetes mellitus with hyperglycemia: Secondary | ICD-10-CM

## 2014-10-13 DIAGNOSIS — I255 Ischemic cardiomyopathy: Secondary | ICD-10-CM

## 2014-10-13 DIAGNOSIS — I10 Essential (primary) hypertension: Secondary | ICD-10-CM

## 2014-10-13 DIAGNOSIS — I482 Chronic atrial fibrillation, unspecified: Secondary | ICD-10-CM | POA: Insufficient documentation

## 2014-10-13 DIAGNOSIS — J189 Pneumonia, unspecified organism: Secondary | ICD-10-CM | POA: Diagnosis not present

## 2014-10-13 DIAGNOSIS — Y95 Nosocomial condition: Secondary | ICD-10-CM | POA: Diagnosis not present

## 2014-10-13 DIAGNOSIS — J449 Chronic obstructive pulmonary disease, unspecified: Secondary | ICD-10-CM

## 2014-10-13 DIAGNOSIS — N183 Chronic kidney disease, stage 3 unspecified: Secondary | ICD-10-CM | POA: Diagnosis present

## 2014-10-13 DIAGNOSIS — I5022 Chronic systolic (congestive) heart failure: Secondary | ICD-10-CM | POA: Diagnosis not present

## 2014-10-13 DIAGNOSIS — E871 Hypo-osmolality and hyponatremia: Secondary | ICD-10-CM

## 2014-10-13 DIAGNOSIS — I480 Paroxysmal atrial fibrillation: Secondary | ICD-10-CM

## 2014-10-13 DIAGNOSIS — D62 Acute posthemorrhagic anemia: Secondary | ICD-10-CM

## 2014-10-13 DIAGNOSIS — E785 Hyperlipidemia, unspecified: Secondary | ICD-10-CM | POA: Diagnosis not present

## 2014-10-13 DIAGNOSIS — I129 Hypertensive chronic kidney disease with stage 1 through stage 4 chronic kidney disease, or unspecified chronic kidney disease: Secondary | ICD-10-CM | POA: Diagnosis not present

## 2014-10-13 DIAGNOSIS — R5381 Other malaise: Principal | ICD-10-CM

## 2014-10-13 DIAGNOSIS — E876 Hypokalemia: Secondary | ICD-10-CM | POA: Diagnosis not present

## 2014-10-13 DIAGNOSIS — D696 Thrombocytopenia, unspecified: Secondary | ICD-10-CM

## 2014-10-13 DIAGNOSIS — K922 Gastrointestinal hemorrhage, unspecified: Secondary | ICD-10-CM | POA: Diagnosis present

## 2014-10-13 DIAGNOSIS — E1159 Type 2 diabetes mellitus with other circulatory complications: Secondary | ICD-10-CM | POA: Diagnosis present

## 2014-10-13 LAB — BASIC METABOLIC PANEL
Anion gap: 10 (ref 5–15)
BUN: 32 mg/dL — ABNORMAL HIGH (ref 6–20)
CO2: 31 mmol/L (ref 22–32)
Calcium: 8.1 mg/dL — ABNORMAL LOW (ref 8.9–10.3)
Chloride: 88 mmol/L — ABNORMAL LOW (ref 101–111)
Creatinine, Ser: 1.73 mg/dL — ABNORMAL HIGH (ref 0.61–1.24)
GFR calc Af Amer: 42 mL/min — ABNORMAL LOW (ref >60)
GFR, EST NON AFRICAN AMERICAN: 36 mL/min — AB (ref >60)
Glucose, Bld: 95 mg/dL (ref 65–99)
POTASSIUM: 3.3 mmol/L — AB (ref 3.5–5.1)
Sodium: 129 mmol/L — ABNORMAL LOW (ref 135–145)

## 2014-10-13 LAB — CBC
HCT: 25.6 % — ABNORMAL LOW (ref 39.0–52.0)
HEMATOCRIT: 24.8 % — AB (ref 39.0–52.0)
Hemoglobin: 8.4 g/dL — ABNORMAL LOW (ref 13.0–17.0)
Hemoglobin: 8.6 g/dL — ABNORMAL LOW (ref 13.0–17.0)
MCH: 30.4 pg (ref 26.0–34.0)
MCH: 30.9 pg (ref 26.0–34.0)
MCHC: 33.9 g/dL (ref 30.0–36.0)
MCHC: 33.6 g/dL (ref 30.0–36.0)
MCV: 89.9 fL (ref 78.0–100.0)
MCV: 92.1 fL (ref 78.0–100.0)
PLATELETS: 140 10*3/uL — AB (ref 150–400)
Platelets: 139 10*3/uL — ABNORMAL LOW (ref 150–400)
RBC: 2.76 MIL/uL — ABNORMAL LOW (ref 4.22–5.81)
RBC: 2.78 MIL/uL — ABNORMAL LOW (ref 4.22–5.81)
RDW: 17.5 % — AB (ref 11.5–15.5)
RDW: 17.4 % — ABNORMAL HIGH (ref 11.5–15.5)
WBC: 8.1 10*3/uL (ref 4.0–10.5)
WBC: 8.8 10*3/uL (ref 4.0–10.5)

## 2014-10-13 LAB — GLUCOSE, CAPILLARY
GLUCOSE-CAPILLARY: 86 mg/dL (ref 65–99)
Glucose-Capillary: 164 mg/dL — ABNORMAL HIGH (ref 65–99)
Glucose-Capillary: 152 mg/dL — ABNORMAL HIGH (ref 65–99)
Glucose-Capillary: 116 mg/dL — ABNORMAL HIGH (ref 65–99)

## 2014-10-13 LAB — URINE CULTURE

## 2014-10-13 LAB — VANCOMYCIN, TROUGH: VANCOMYCIN TR: 19 ug/mL (ref 10.0–20.0)

## 2014-10-13 LAB — MAGNESIUM: Magnesium: 1.8 mg/dL (ref 1.7–2.4)

## 2014-10-13 MED ORDER — APIXABAN 2.5 MG PO TABS
2.5000 mg | ORAL_TABLET | Freq: Two times a day (BID) | ORAL | Status: DC
  Administered 2014-10-13 – 2014-10-18 (×10): 2.5 mg via ORAL
  Filled 2014-10-13 (×13): qty 1

## 2014-10-13 MED ORDER — APIXABAN 2.5 MG PO TABS: 2.5000 mg | ORAL_TABLET | Freq: Two times a day (BID) | ORAL | Status: DC

## 2014-10-13 MED ORDER — PRAVASTATIN SODIUM 80 MG PO TABS
80.0000 mg | ORAL_TABLET | Freq: Every day | ORAL | Status: DC
  Administered 2014-10-13 – 2014-10-17 (×5): 80 mg via ORAL
  Filled 2014-10-13 (×6): qty 1

## 2014-10-13 MED ORDER — TIOTROPIUM BROMIDE MONOHYDRATE 18 MCG IN CAPS
18.0000 ug | ORAL_CAPSULE | Freq: Every day | RESPIRATORY_TRACT | Status: DC
  Administered 2014-10-14 – 2014-10-18 (×4): 18 ug via RESPIRATORY_TRACT
  Filled 2014-10-13: qty 5

## 2014-10-13 MED ORDER — ISOSORBIDE MONONITRATE ER 30 MG PO TB24
30.0000 mg | ORAL_TABLET | Freq: Every day | ORAL | Status: DC
  Administered 2014-10-14: 30 mg via ORAL
  Filled 2014-10-13 (×3): qty 1

## 2014-10-13 MED ORDER — CARVEDILOL 3.125 MG PO TABS
3.1250 mg | ORAL_TABLET | Freq: Two times a day (BID) | ORAL | Status: DC
  Administered 2014-10-13 – 2014-10-18 (×10): 3.125 mg via ORAL
  Filled 2014-10-13 (×12): qty 1

## 2014-10-13 MED ORDER — FLEET ENEMA 7-19 GM/118ML RE ENEM: 1.0000 | ENEMA | Freq: Once | RECTAL | Status: AC | PRN

## 2014-10-13 MED ORDER — SACCHAROMYCES BOULARDII 250 MG PO CAPS
250.0000 mg | ORAL_CAPSULE | Freq: Two times a day (BID) | ORAL | Status: DC
  Administered 2014-10-13 – 2014-10-18 (×10): 250 mg via ORAL
  Filled 2014-10-13 (×13): qty 1

## 2014-10-13 MED ORDER — GUAIFENESIN-DM 100-10 MG/5ML PO SYRP: 5.0000 mL | ORAL_SOLUTION | Freq: Four times a day (QID) | ORAL | Status: DC | PRN

## 2014-10-13 MED ORDER — PANTOPRAZOLE SODIUM 40 MG PO TBEC
40.0000 mg | DELAYED_RELEASE_TABLET | Freq: Every day | ORAL | Status: DC
  Administered 2014-10-14 – 2014-10-18 (×5): 40 mg via ORAL
  Filled 2014-10-13 (×6): qty 1

## 2014-10-13 MED ORDER — SENNOSIDES-DOCUSATE SODIUM 8.6-50 MG PO TABS: 1.0000 | ORAL_TABLET | Freq: Every evening | ORAL | Status: DC | PRN

## 2014-10-13 MED ORDER — ALUM & MAG HYDROXIDE-SIMETH 200-200-20 MG/5ML PO SUSP: 30.0000 mL | ORAL | Status: DC | PRN

## 2014-10-13 MED ORDER — AMOXICILLIN-POT CLAVULANATE 500-125 MG PO TABS: 1.0000 | ORAL_TABLET | Freq: Three times a day (TID) | ORAL | Status: DC

## 2014-10-13 MED ORDER — ONDANSETRON HCL 4 MG/2ML IJ SOLN: 4.0000 mg | Freq: Four times a day (QID) | INTRAMUSCULAR | Status: DC | PRN

## 2014-10-13 MED ORDER — POTASSIUM CHLORIDE CRYS ER 20 MEQ PO TBCR
20.0000 meq | EXTENDED_RELEASE_TABLET | Freq: Three times a day (TID) | ORAL | Status: DC
  Administered 2014-10-13: 20 meq via ORAL
  Filled 2014-10-13 (×4): qty 1

## 2014-10-13 MED ORDER — BUDESONIDE 0.25 MG/2ML IN SUSP
0.2500 mg | Freq: Two times a day (BID) | RESPIRATORY_TRACT | Status: DC
  Administered 2014-10-13 – 2014-10-18 (×9): 0.25 mg via RESPIRATORY_TRACT
  Filled 2014-10-13 (×12): qty 2

## 2014-10-13 MED ORDER — TORSEMIDE 20 MG PO TABS
40.0000 mg | ORAL_TABLET | Freq: Every day | ORAL | Status: DC
  Administered 2014-10-14: 40 mg via ORAL
  Filled 2014-10-13 (×3): qty 2

## 2014-10-13 MED ORDER — INSULIN ASPART 100 UNIT/ML ~~LOC~~ SOLN
0.0000 [IU] | Freq: Three times a day (TID) | SUBCUTANEOUS | Status: DC
Start: 2014-10-13 — End: 2014-10-18
  Administered 2014-10-13: 2 [IU] via SUBCUTANEOUS
  Administered 2014-10-14 – 2014-10-18 (×3): 1 [IU] via SUBCUTANEOUS

## 2014-10-13 MED ORDER — ONDANSETRON HCL 4 MG PO TABS: 4.0000 mg | ORAL_TABLET | Freq: Four times a day (QID) | ORAL | Status: DC | PRN

## 2014-10-13 MED ORDER — HYDRALAZINE HCL 25 MG PO TABS
25.0000 mg | ORAL_TABLET | Freq: Three times a day (TID) | ORAL | Status: DC
Start: 2014-10-13 — End: 2014-10-15
  Administered 2014-10-13: 25 mg via ORAL
  Filled 2014-10-13 (×8): qty 1

## 2014-10-13 MED ORDER — TRAZODONE HCL 50 MG PO TABS
25.0000 mg | ORAL_TABLET | Freq: Every evening | ORAL | Status: DC | PRN
  Filled 2014-10-13: qty 1

## 2014-10-13 MED ORDER — AMOXICILLIN-POT CLAVULANATE 500-125 MG PO TABS
1.0000 | ORAL_TABLET | Freq: Three times a day (TID) | ORAL | Status: DC
  Administered 2014-10-13 – 2014-10-14 (×4): 500 mg via ORAL
  Filled 2014-10-13 (×8): qty 1

## 2014-10-13 MED ORDER — GUAIFENESIN ER 600 MG PO TB12
600.0000 mg | ORAL_TABLET | Freq: Two times a day (BID) | ORAL | Status: DC
  Administered 2014-10-13 – 2014-10-18 (×10): 600 mg via ORAL
  Filled 2014-10-13 (×12): qty 1

## 2014-10-13 MED ORDER — PRAVASTATIN SODIUM 80 MG PO TABS
80.0000 mg | ORAL_TABLET | Freq: Every day | ORAL | Status: DC
  Filled 2014-10-13: qty 1

## 2014-10-13 MED ORDER — AMIODARONE HCL 200 MG PO TABS
200.0000 mg | ORAL_TABLET | Freq: Every day | ORAL | Status: DC
Start: 2014-10-14 — End: 2014-10-18
  Administered 2014-10-14 – 2014-10-18 (×5): 200 mg via ORAL
  Filled 2014-10-13 (×7): qty 1

## 2014-10-13 MED ORDER — VITAMIN B-1 100 MG PO TABS
100.0000 mg | ORAL_TABLET | Freq: Every day | ORAL | Status: DC
  Administered 2014-10-14 – 2014-10-18 (×5): 100 mg via ORAL
  Filled 2014-10-13 (×7): qty 1

## 2014-10-13 MED ORDER — LEVOTHYROXINE SODIUM 75 MCG PO TABS
75.0000 ug | ORAL_TABLET | Freq: Every day | ORAL | Status: DC
  Administered 2014-10-14 – 2014-10-18 (×5): 75 ug via ORAL
  Filled 2014-10-13 (×6): qty 1

## 2014-10-13 MED ORDER — FOLIC ACID 1 MG PO TABS
1.0000 mg | ORAL_TABLET | Freq: Every day | ORAL | Status: DC
  Administered 2014-10-14 – 2014-10-18 (×5): 1 mg via ORAL
  Filled 2014-10-13 (×7): qty 1

## 2014-10-13 MED ORDER — POTASSIUM CHLORIDE CRYS ER 20 MEQ PO TBCR: 40.0000 meq | EXTENDED_RELEASE_TABLET | Freq: Once | ORAL | Status: DC

## 2014-10-13 MED ORDER — BISACODYL 10 MG RE SUPP: 10.0000 mg | Freq: Every day | RECTAL | Status: DC | PRN

## 2014-10-13 MED ORDER — INSULIN GLARGINE 100 UNIT/ML ~~LOC~~ SOLN
10.0000 [IU] | Freq: Every day | SUBCUTANEOUS | Status: DC
  Administered 2014-10-13 – 2014-10-17 (×5): 10 [IU] via SUBCUTANEOUS
  Filled 2014-10-13 (×6): qty 0.1

## 2014-10-13 MED ORDER — GUAIFENESIN ER 600 MG PO TB12
600.0000 mg | ORAL_TABLET | Freq: Two times a day (BID) | ORAL | Status: DC
Start: 2014-10-13 — End: 2014-11-17

## 2014-10-13 NOTE — Progress Notes (Signed)
Meredith Staggers, MD Physician Signed Physical Medicine and Rehabilitation Consult Note 10/12/2014 8:17 AM  Related encounter: ED to Hosp-Admission (Discharged) from 10/09/2014 in [place] Collapse All        Physical Medicine and Rehabilitation Consult  Reason for Consult: Gait disorder Referring Physician: Dr. Dyann Kief   HPI: [name] is a 79 y.o. male with history of CAD, VT/ICD, DM type 2, COPD-oxygen HS, chronic systolic CHF-milrinone, recent admission 08/2014 for GIB, candida esophagitis and PNA who was readmitted 10/09/14 with worsening of weakness, increasing DOE and falls. CT head without acute changes. Patient was noted to be anemic with Hgb 7.2, positive FOBT and CXR showed evidence of volume overload and PNA. He was transfused with 1 units PRBC and started on IV anbiotics for PNA. Dr. Haroldine Laws consulted for input on chronic AC held and diuretics resumed to help with fluid overload. Dr. Henrene Pastor consulted for input on colonoscopy and expressed concerns as prior attempts at colonoscopy unsuccessful due to sigmoid stenosis. He recommended capsule endoscopy for work up with transfusion as indicated and resume AC if H/H stable. Swallow evaluation done and negative for signs of dysphagia. PT evaluation done yesterday and CIR recommended due to deconditioned state.    Review of Systems  HENT: Negative for hearing loss.  Eyes: Negative for blurred vision and double vision.   Needs cataracts surgery  Respiratory: Positive for cough and shortness of breath.  Cardiovascular: Negative for chest pain and palpitations.  Gastrointestinal: Negative for heartburn and abdominal pain.  Musculoskeletal: Negative for myalgias and back pain.  Neurological: Negative for focal weakness and headaches.      Past Medical History  Diagnosis Date  . VENTRICULAR TACHYCARDIA   . PERIPHERAL VASCULAR DISEASE   . HYPERTENSION      Hypertensive retinopathy-grade II OU  . HYPERCHOLESTEROLEMIA   . CORONARY ARTERY DISEASE     Hx of CABG  . Chronic systolic heart failure     Ischemic cardiomyopathy (01/2014 EF 40-45% with hypokinesis + small area of akinesis  . SEBORRHEIC DERMATITIS   . Chronic renal insufficiency, stage III (moderate)     CrCl 40s-50s  . DIVERTICULOSIS OF COLON   . COPD, severe   . COLONIC POLYPS   . ANXIETY   . ICD (implantable cardiac defibrillator) in place   . Type II diabetes mellitus     +background diabetic retinopathy OU  . Chronic lower back pain   . DJD (degenerative joint disease)     "hands" (05/24/2012)  . Osteoarthritis of finger   . History of atrial flutter   . Thrombus 12/2013    on ICD lead--started anticoag and his cardioversion for atrial flutter was postponed.  . Colon stricture 2015    with features worrisome for mass, also with recent rising CEA level (possible colon cancer)-GI MD= Dr. Franki Cabot recent eval/follow up with Dr. Earlean Shawl was summer 2014, at which time he recommended colonoscopy but pt declined.  . Macular degeneration, age related, nonexudative     OU    Past Surgical History  Procedure Laterality Date  . Coronary artery bypass graft  1990's    CABG X4  . Tonsillectomy  ?1942  . Cardiac defibrillator placement  12/2004    single chamber defibrillator- Medtronic Maxima 8/06 DrKlein [Other][  . Tee without cardioversion N/A 12/22/2013    Procedure: TRANSESOPHAGEAL ECHOCARDIOGRAM (TEE); Surgeon: Thayer Headings, MD; Location: [place]; Service: Cardiovascular; Laterality: N/A;  .  Cardioversion N/A 12/22/2013    Procedure: CARDIOVERSION; Surgeon: Thayer Headings, MD; Location: [facility] ENDOSCOPY; Service: Cardiovascular; Laterality: N/A;  . Cardiovascular stress test  01/12/13    myocard perf imaging: previous infarct with a  moderately reduced EF as was known previously. No new findings.   . Gastric emptying scan  02/02/14    Normal  . Transthoracic echocardiogram  01/25/14; 04/2014    EF 40-45%, diffuse hypokinesis, infero-basilar myocardial akinesis, PA pressure slightly high, valves fine.04/2014-Echo today EF ~40% inferior AK moderate to severe MR  . Abdominal ultrasound  01/2014    GB sludge, o/w normal  . Ct virtual colonoscopy diagnostic  11/2012    Asymmetric thickening of the rectosigmoid colon worrisome for  . Cardiovascular stress test      Lexiscan: There is a medium sized fixed inferior wall defect of moderate severity involving the apical inferior and mid inferior and basal inferoseptal segments, consistent with old inferior MI. No significant reversible ischemia. EF 34%, inferior wall hypokinesis--intermediate risk scan (ok to do planned procedure)  . Implantable cardioverter defibrillator revision N/A 05/25/2012    Procedure: IMPLANTABLE CARDIOVERTER DEFIBRILLATOR REVISION; Surgeon: Evans Lance, MD; Location: [facility] CATH LAB; Service: Cardiovascular; Laterality: N/A;  . Right heart catheterization N/A 08/31/2014    Procedure: RIGHT HEART CATH; Surgeon: Larey Dresser, MD; Location: [facility] CATH LAB; Service: Cardiovascular; Laterality: N/A;  . Esophagogastroduodenoscopy N/A 09/03/2014    Procedure: ESOPHAGOGASTRODUODENOSCOPY (EGD); Surgeon: Carol Ada, MD; Location: [facility] ENDOSCOPY; Service: Endoscopy; Laterality: N/A;   Family History  Problem Relation Age of Onset  . Heart attack Father 31  . Heart attack Brother     Vague history    Social History: Married. 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 Independent without AD prior to 08/2014 admission. He reports that he quit smoking about 14 years ago. His smoking use included Cigarettes. He has a 100 pack-year smoking history. He has never used smokeless  tobacco. He reports that he drinks a beer daily. He reports that he does not use illicit drugs.    Allergies  Allergen Reactions  . Niacin Itching    Niaspan     Medications Prior to Admission  Medication Sig Dispense Refill  . amiodarone (PACERONE) 200 MG tablet Take 1 tablet (200 mg total) by mouth daily. 30 tablet 0  . apixaban (ELIQUIS) 5 MG TABS tablet Take 1 tablet (5 mg total) by mouth 2 (two) times daily. 60 tablet 0  . carvedilol (COREG) 3.125 MG tablet Take 1 tablet (3.125 mg total) by mouth 2 (two) times daily with a meal. 60 tablet 0  . chlorhexidine (PERIDEX) 0.12 % solution Use as directed 5 mLs in the mouth or throat 2 (two) times daily.     . fexofenadine (ALLEGRA) 180 MG tablet Take 180 mg by mouth daily as needed for allergies or rhinitis.    . Fluticasone-Salmeterol (ADVAIR) 500-50 MCG/DOSE AEPB Inhale 1 puff into the lungs 2 (two) times daily. 60 each 11  . folic acid (FOLVITE) 1 MG tablet Take 1 tablet (1 mg total) by mouth daily. 30 tablet 0  . hydrALAZINE (APRESOLINE) 25 MG tablet Take 25 mg by mouth 3 (three) times daily.    . Insulin Glargine (LANTUS SOLOSTAR) 100 UNIT/ML Solostar Pen 10 units SQ qhs (Patient taking differently: Inject 10 Units into the skin at bedtime. ) 15 mL 11  . insulin lispro (HUMALOG KWIKPEN) 100 UNIT/ML KiwkPen Take 7 units in the morning, 8 units at noon, and 9 units at  bedtime. (Patient taking differently: Inject into the skin. Take 7 units in the morning, 8 units at noon, and 9 units at dinner.) 5 pen 3  . isosorbide mononitrate (IMDUR) 30 MG 24 hr tablet TAKE ONE TABLET BY MOUTH DAILY 30 tablet 0  . levofloxacin (LEVAQUIN) 750 MG tablet Take 1 tablet (750 mg total) by mouth every other day. 3 tablet 0  . levothyroxine (SYNTHROID, LEVOTHROID) 75 MCG tablet Take 1 tablet (75 mcg total) by mouth daily. 30 tablet 3  . metolazone (ZAROXOLYN) 2.5 MG tablet Take  on mondays and Fridays (Patient taking differently: Take 2.5 mg by mouth 2 (two) times daily. Take on mondays and Fridays) 60 tablet 3  . pantoprazole (PROTONIX) 40 MG tablet Take 1 tablet (40 mg total) by mouth 2 (two) times daily. 60 tablet 6  . potassium chloride SA (KLOR-CON M20) 20 MEQ tablet Take 1 tablet (20 mEq total) by mouth 2 (two) times daily. 90 tablet 3  . SPIRIVA HANDIHALER 18 MCG inhalation capsule PLACE ONE CAPSULE INTO INHALER AND INHALE DAILY (Patient taking differently: PLACE ONE CAPSULE INTO INHALER AND every evening.) 30 capsule 0  . thiamine 100 MG tablet Take 1 tablet (100 mg total) by mouth daily. 30 tablet 0  . tobramycin (TOBREX) 0.3 % ophthalmic solution Place 2 drops into both eyes every 6 (six) hours. 5 mL 0  . torsemide (DEMADEX) 20 MG tablet Take 3 tablets (60 mg total) by mouth daily. (Patient taking differently: Take 20 mg by mouth 2 (two) times daily. ) 30 tablet 0  . pravastatin (PRAVACHOL) 80 MG tablet Take 80 mg by mouth at bedtime.       Home: Home Living Family/patient expects to be discharged to:: Private residence Living Arrangements: Spouse/significant other Available Help at Discharge: Family, Available 24 hours/day Type of Home: House Home Access: Stairs to enter [employer] of Steps: 2 Home Layout: One level Home Equipment: Environmental consultant - 2 wheels  Functional History: Prior Function Level of Independence: Needs assistance Gait / Transfers Assistance Needed: has been falling at home, but before last admission in 4/16, was independent without AD ADL's / Homemaking Assistance Needed: wife has been assisting Comments: pt somewhat lethargic and did not offer much information on eval and questioned validity of some answers Functional Status:  Mobility: Bed Mobility Overal bed mobility: Needs Assistance Bed Mobility: Supine to Sit Supine to sit: Mod assist General bed mobility comments: needed  verbal and tactile cues to initiate mvmt and then required mod A for legs off bed and trunk elevation Transfers Overall transfer level: Needs assistance Equipment used: Rolling walker (2 wheeled) Transfers: Sit to/from Stand, Stand Pivot Transfers Sit to Stand: +2 physical assistance, Mod assist, Min assist, +2 safety/equipment Stand pivot transfers: Min assist, +2 physical assistance General transfer comment: first sit to stand, pt required +2 mod A for power up and difficulty getting weight fwd. Once up, min A and +2 for safety and equipment. Second sit to stand from recliner, pt able to get up with min A.  Ambulation/Gait Ambulation/Gait assistance: Min assist, +2 safety/equipment Ambulation Distance (Feet): 70 Feet Assistive device: Rolling walker (2 wheeled) General Gait Details: O2 sats 89-90% on RA, 2 standing rest breaks, pt moving very slowly with flexed gait despite vc's.  Gait Pattern/deviations: Step-through pattern, Decreased stride length, Shuffle, Trunk flexed Gait velocity: decreased Gait velocity interpretation: <1.8 ft/sec, indicative of risk for recurrent falls    ADL:    Cognition: Cognition Overall Cognitive Status: Impaired/Different from baseline  Orientation Level: Oriented X4 Cognition Arousal/Alertness: Lethargic Behavior During Therapy: WFL for tasks assessed/performed, Flat affect Overall Cognitive Status: Impaired/Different from baseline Area of Impairment: Memory, Following commands, Safety/judgement, Problem solving Memory: Decreased short-term memory Following Commands: Follows one step commands consistently, Follows one step commands with increased time, Follows multi-step commands with increased time Safety/Judgement: Decreased awareness of safety, Decreased awareness of deficits Problem Solving: Slow processing, Requires verbal cues General Comments: pt appears somewhat confused today and flat affect  Blood pressure 99/35, pulse 57, temperature  98.1 F (36.7 C), temperature source Oral, resp. rate 11, height 5\' 7"  (1.702 m), weight 68.811 kg (151 lb 11.2 oz), SpO2 100 %. Physical Exam  Nursing note and vitals reviewed. Constitutional: He is oriented to person, place, and time. He appears well-developed. He has a sickly appearance. Nasal cannula in place.  HENT:  Head: Normocephalic and atraumatic.  Eyes: Conjunctivae are normal. Pupils are equal, round, and reactive to light.  Neck: Neck supple.  Cardiovascular: Normal rate and regular rhythm.  Respiratory: Effort normal. No respiratory distress. He has decreased breath sounds in the right lower field and the left lower field. He has wheezes.  Weak congested cough noted.  GI: Soft. Bowel sounds are normal. He exhibits no distension. There is no tenderness.  Musculoskeletal: He exhibits edema (Min edema BUE with multiple ecchymotic areas.). He exhibits no tenderness.  Neurological: He is alert and oriented to person, place, and time.  Speech clear. Follows basic commands without difficulty. Decreased sensation R-foot. UE: 4/5 prox to distal. LE: 2/5hf, 3+ke and 3+ adf/paf. Cognitively appropriate  Skin: Skin is warm and dry.     Lab Results Last 24 Hours    Results for orders placed or performed during the hospital encounter of 10/09/14 (from the past 24 hour(s))  Glucose, capillary Status: Abnormal   Collection Time: 10/11/14 11:28 AM  Result Value Ref Range   Glucose-Capillary 147 (H) 65 - 99 mg/dL   Comment 1 Capillary Specimen   Glucose, capillary Status: Abnormal   Collection Time: 10/11/14 5:10 PM  Result Value Ref Range   Glucose-Capillary 180 (H) 65 - 99 mg/dL   Comment 1 Capillary Specimen   Glucose, capillary Status: Abnormal   Collection Time: 10/11/14 9:12 PM  Result Value Ref Range   Glucose-Capillary 161 (H) 65 - 99 mg/dL   Comment 1 Capillary Specimen   Basic metabolic panel Status:  Abnormal   Collection Time: 10/12/14 3:05 AM  Result Value Ref Range   Sodium 127 (L) 135 - 145 mmol/L   Potassium 4.0 3.5 - 5.1 mmol/L   Chloride 90 (L) 101 - 111 mmol/L   CO2 29 22 - 32 mmol/L   Glucose, Bld 134 (H) 65 - 99 mg/dL   BUN 35 (H) 6 - 20 mg/dL   Creatinine, Ser 1.78 (H) 0.61 - 1.24 mg/dL   Calcium 7.8 (L) 8.9 - 10.3 mg/dL   GFR calc non Af Amer 35 (L) >60 mL/min   GFR calc Af Amer 41 (L) >60 mL/min   Anion gap 8 5 - 15  CBC Status: Abnormal   Collection Time: 10/12/14 3:05 AM  Result Value Ref Range   WBC 8.6 4.0 - 10.5 K/uL   RBC 2.71 (L) 4.22 - 5.81 MIL/uL   Hemoglobin 8.2 (L) 13.0 - 17.0 g/dL   HCT 24.6 (L) 39.0 - 52.0 %   MCV 90.8 78.0 - 100.0 fL   MCH 30.3 26.0 - 34.0 pg   MCHC 33.3 30.0 - 36.0  g/dL   RDW 18.1 (H) 11.5 - 15.5 %   Platelets 120 (L) 150 - 400 K/uL  Magnesium Status: Abnormal   Collection Time: 10/12/14 3:05 AM  Result Value Ref Range   Magnesium 1.6 (L) 1.7 - 2.4 mg/dL      Imaging Results (Last 48 hours)    No results found.    Assessment/Plan: Diagnosis: debility due to PNA/CHF and multiple medical 1. Does the need for close, 24 hr/day medical supervision in concert with the patient's rehab needs make it unreasonable for this patient to be served in a less intensive setting? Yes 2. Co-Morbidities requiring supervision/potential complications: GIB, COPD, CKD 3. Due to bladder management, bowel management, safety, skin/wound care, disease management, medication administration, pain management and patient education, does the patient require 24 hr/day rehab nursing? Yes 4. Does the patient require coordinated care of a physician, rehab nurse, PT (1-2 hrs/day, 5 days/week) and OT (1-2 hrs/day, 5 days/week) to address physical and functional deficits in the context of the above medical diagnosis(es)? Yes Addressing deficits in the  following areas: balance, endurance, locomotion, strength, transferring, bowel/bladder control, bathing, dressing, feeding, grooming, toileting and psychosocial support 5. Can the patient actively participate in an intensive therapy program of at least 3 hrs of therapy per day at least 5 days per week? Yes 6. The potential for patient to make measurable gains while on inpatient rehab is excellent 7. Anticipated functional outcomes upon discharge from inpatient rehab are modified independent and supervision with PT, modified independent, supervision and min assist with OT, n/a with SLP. 8. Estimated rehab length of stay to reach the above functional goals is: 12-18 days 9. Does the patient have adequate social supports and living environment to accommodate these discharge functional goals? Yes 10. Anticipated D/C setting: Home 11. Anticipated post D/C treatments: HH therapy and Outpatient therapy 12. Overall Rehab/Functional Prognosis: excellent  RECOMMENDATIONS: This patient's condition is appropriate for continued rehabilitative care in the following setting: CIR Patient has agreed to participate in recommended program. Yes Note that insurance prior authorization may be required for reimbursement for recommended care.  Comment: Rehab Admissions Coordinator to follow up.  Thanks,  Meredith Staggers, MD, [facility]     10/12/2014       Revision History     Date/Time User Provider Type Action   10/12/2014 9:42 AM Meredith Staggers, MD Physician Sign   10/12/2014 9:27 AM Bary Leriche, PA-C Physician Assistant Share   View Details Report       Routing History     Date/Time From To Method   10/12/2014 9:42 AM Meredith Staggers, MD Meredith Staggers, MD In Basket   10/12/2014 9:42 AM Meredith Staggers, MD Tammi Sou, MD In Basket

## 2014-10-13 NOTE — Progress Notes (Signed)
Daily Rounding Note  10/13/2014, 12:37 PM  LOS: 3 days   SUBJECTIVE:       Feels ok.  Planning to go to inpt rehab, today? BM this AM, he did not look at it but it felt normal.  Stool not recorded by tech.    OBJECTIVE:         Vital signs in last 24 hours:    Temp:  [97.8 F (36.6 C)-98.2 F (36.8 C)] 98.2 F (36.8 C) (05/20 0537) Pulse Rate:  [55-65] 57 (05/20 1220) Resp:  [10-18] 18 (05/20 0537) BP: (92-109)/(33-58) 109/45 mmHg (05/20 1220) SpO2:  [95 %-100 %] 100 % (05/20 1006) Weight:  [144 lb 8 oz (65.545 kg)-147 lb 14.9 oz (67.1 kg)] 144 lb 8 oz (65.545 kg) (05/20 0537) Last BM Date: 10/13/14 Filed Weights   10/12/14 0428 10/13/14 0200 10/13/14 0537  Weight: 151 lb 11.2 oz (68.811 kg) 147 lb 14.9 oz (67.1 kg) 144 lb 8 oz (65.545 kg)   General: weak, frail but overall looks stronger.     Heart: RRR Chest: clear bil  Abdomen: soft, NT, ND.  Active BS  Extremities: + LE edema  Neuro/Psych:  Pleasant, alert, relaxed. Not confused. Moves all 4s.    Intake/Output from previous day: 05/19 0701 - 05/20 0700 In: 650 [P.O.:600; IV Piggyback:50] Out: 2735 [Urine:2735]  Intake/Output this shift: Total I/O In: 240 [P.O.:240] Out: -   Lab Results:  Recent Labs  10/11/14 0238 10/12/14 0305 10/13/14 0350  WBC 10.5 8.6 8.1  HGB 8.0* 8.2* 8.4*  HCT 23.9* 24.6* 24.8*  PLT 137* 120* 139*   BMET  Recent Labs  10/11/14 0238 10/12/14 0305 10/13/14 0350  NA 126* 127* 129*  K 4.4 4.0 3.3*  CL 89* 90* 88*  CO2 29 29 31   GLUCOSE 129* 134* 95  BUN 40* 35* 32*  CREATININE 1.85* 1.78* 1.73*  CALCIUM 8.1* 7.8* 8.1*   LFT  Recent Labs  10/11/14 0238  PROT 5.3*  ALBUMIN 2.1*  AST 77*  ALT 21  ALKPHOS 84  BILITOT 1.4*    Studies/Results: No results found.  Scheduled Meds: . amiodarone  200 mg Oral Daily  . apixaban  2.5 mg Oral BID  . budesonide (PULMICORT) nebulizer solution  0.25 mg  Nebulization BID  . carvedilol  3.125 mg Oral BID WC  . ceFEPime (MAXIPIME) IV  1 g Intravenous Q24H  . folic acid  1 mg Oral Daily  . guaiFENesin  600 mg Oral BID  . hydrALAZINE  25 mg Oral TID  . insulin aspart  0-9 Units Subcutaneous TID WC  . insulin glargine  10 Units Subcutaneous QHS  . isosorbide mononitrate  30 mg Oral Daily  . levothyroxine  75 mcg Oral QAC breakfast  . pantoprazole  40 mg Oral Q0600  . potassium chloride  40 mEq Oral Once  . sodium chloride  3 mL Intravenous Q12H  . thiamine  100 mg Oral Daily  . tiotropium  18 mcg Inhalation Daily  . torsemide  40 mg Oral Daily  . vancomycin  750 mg Intravenous Q24H   Continuous Infusions:  PRN Meds:.acetaminophen **OR** acetaminophen, ondansetron **OR** ondansetron (ZOFRAN) IV   ASSESMENT:   *  Anemia, FOBT + stool.   EGD 08/2014: retained gastric contents, candida.  On Protonix.  Capsule Endoscopy 5/19, needs to be read.  Hgb stable.   *  Chronic AC with Eliquis for A fib, restarted at lower  dose 5/18.   *  Thrombocytopenia, since 08/2014, improved.   *  Sigmoid colon stenosis. Felt to be benign by Dr Earlean Shawl (primary GI)   PLAN   *  From GI standpoint, ok to go to rehab.  We will communicate capsule study findings with pt once they are available.      Azucena Freed  10/13/2014, 12:37 PM Pager: [phone]  GI ATTENDING  Interval history and data reviewed. Patient seen and examined. Wife in room. Has completed capsule endoscopy uneventfully. No obvious GI bleeding since admission. On lower dose Eliquis. Plans for transfer to rehabilitation. Capsule endoscopy will be read early next week. We will share results with Dr. Sung Amabile. Will sign off.  Docia Chuck. Geri Seminole., M.D. [facility] Division of Gastroenterology

## 2014-10-13 NOTE — Clinical Social Work Note (Signed)
CSW received referral for SNF.  Case discussed with case manager and plan is to discharge to inpatient rehab CIR.  CSW to sign off please re-consult if social work needs arise.  Jones Broom. [place], MSW, [place]

## 2014-10-13 NOTE — Progress Notes (Addendum)
Advanced Heart Failure Rounding Note   Subjective:     Po diuretics restarted 10/11/14. Apixaban 2.5 bid also restarted. Hgb stable. Underwent pill endoscopy yesterday. GI will communicate capsule study findings with pt once they are available. He is feeling well but very weak. Planning for inpatient rehab discharge today. No CP , SOB, orthopnea, PND or LE edema.    Objective:   Weight Range:  Vital Signs:   Temp:  [97.8 F (36.6 C)-98.2 F (36.8 C)] 98.2 F (36.8 C) (05/20 0537) Pulse Rate:  [55-65] 57 (05/20 1220) Resp:  [10-18] 18 (05/20 0537) BP: (92-109)/(33-58) 109/45 mmHg (05/20 1220) SpO2:  [95 %-100 %] 100 % (05/20 1006) Weight:  [144 lb 8 oz (65.545 kg)-147 lb 14.9 oz (67.1 kg)] 144 lb 8 oz (65.545 kg) (05/20 0537) Last BM Date: 10/13/14  Weight change: Filed Weights   10/12/14 0428 10/13/14 0200 10/13/14 0537  Weight: 151 lb 11.2 oz (68.811 kg) 147 lb 14.9 oz (67.1 kg) 144 lb 8 oz (65.545 kg)    Intake/Output:   Intake/Output Summary (Last 24 hours) at 10/13/14 1236 Last data filed at 10/13/14 0900  Gross per 24 hour  Intake    600 ml  Output   1760 ml  Net  -1160 ml     Physical Exam: General:  Chronically ill  appearing. No resp difficulty HEENT: normal Neck: supple. JVP ~9-10  . Carotids 2+ bilat; no bruits. No lymphadenopathy or thryomegaly appreciated. Cor: PMI nondisplaced. Regular rate & rhythm. No rubs, gallops or murmurs. Lungs: Decreased  Abdomen: soft, nontender, nondistended. No hepatosplenomegaly. No bruits or masses. Good bowel sounds. Extremities: no cyanosis, clubbing, rash,  R and LLE 1-2+ edema SCDs.  Neuro: alert & orientedx3, cranial nerves grossly intact. moves all 4 extremities w/o difficulty. Affect pleasant  Telemetry: Sinus Brady 50s.  Labs: Basic Metabolic Panel:  Recent Labs Lab 10/09/14 2237 10/10/14 0529 10/11/14 0238 10/12/14 0305 10/13/14 0350  NA 125* 126* 126* 127* 129*  K 2.6* 2.9* 4.4 4.0 3.3*  CL 78* 86* 89*  90* 88*  CO2 33* 32 29 29 31   GLUCOSE 99 123* 129* 134* 95  BUN 54* 48* 40* 35* 32*  CREATININE 2.31* 2.02* 1.85* 1.78* 1.73*  CALCIUM 8.3* 7.8* 8.1* 7.8* 8.1*  MG  --  1.5*  --  1.6* 1.8    Liver Function Tests:  Recent Labs Lab 10/09/14 2237 10/10/14 0529 10/11/14 0238  AST 81* 75* 77*  ALT 22 21 21   ALKPHOS 90 82 84  BILITOT 1.2 2.6* 1.4*  PROT 5.8* 5.0* 5.3*  ALBUMIN 2.4* 2.1* 2.1*   No results for input(s): LIPASE, AMYLASE in the last 168 hours. No results for input(s): AMMONIA in the last 168 hours.  CBC:  Recent Labs Lab 10/09/14 2237 10/10/14 0529 10/11/14 0238 10/12/14 0305 10/13/14 0350  WBC 9.8 12.6* 10.5 8.6 8.1  NEUTROABS 6.5 9.1*  --   --   --   HGB 7.2* 8.1* 8.0* 8.2* 8.4*  HCT 20.8* 23.4* 23.9* 24.6* 24.8*  MCV 89.3 88.3 89.5 90.8 89.9  PLT 125* 122* 137* 120* 139*    Cardiac Enzymes: No results for input(s): CKTOTAL, CKMB, CKMBINDEX, TROPONINI in the last 168 hours.  BNP: BNP (last 3 results)  Recent Labs  08/30/14 1639 09/09/14 1150 10/09/14 2237  BNP 800.7* 550.7* 715.8*    ProBNP (last 3 results)  Recent Labs  01/24/14 1909 02/09/14 0945  PROBNP 6721.0* 2555.0*      Other results:  Imaging:  No results found.   Medications:     Scheduled Medications: . amiodarone  200 mg Oral Daily  . apixaban  2.5 mg Oral BID  . budesonide (PULMICORT) nebulizer solution  0.25 mg Nebulization BID  . carvedilol  3.125 mg Oral BID WC  . ceFEPime (MAXIPIME) IV  1 g Intravenous Q24H  . folic acid  1 mg Oral Daily  . guaiFENesin  600 mg Oral BID  . hydrALAZINE  25 mg Oral TID  . insulin aspart  0-9 Units Subcutaneous TID WC  . insulin glargine  10 Units Subcutaneous QHS  . isosorbide mononitrate  30 mg Oral Daily  . levothyroxine  75 mcg Oral QAC breakfast  . pantoprazole  40 mg Oral Q0600  . potassium chloride  40 mEq Oral Once  . sodium chloride  3 mL Intravenous Q12H  . thiamine  100 mg Oral Daily  . tiotropium  18 mcg  Inhalation Daily  . torsemide  40 mg Oral Daily  . vancomycin  750 mg Intravenous Q24H    Infusions:    PRN Medications: acetaminophen **OR** acetaminophen, ondansetron **OR** ondansetron (ZOFRAN) IV     HPI: [name] is a 79 y.o. male with history of CAD, VT/ICD, DM type 2, COPD-oxygen HS, chronic systolic CHF-milrinone, recent admission 08/2014 for GIB, candida esophagitis and PNA who was readmitted 10/09/14 with worsening of weakness, increasing DOE and falls. CT head without acute changes. Patient was noted to be anemic with Hgb 7.2, positive FOBT and CXR showed evidence of volume overload and PNA. He was transfused with 1 units PRBC and started on IV anbiotics for PNA. Dr. Haroldine Laws consulted for input on chronic AC held and diuretics resumed to help with fluid overload. Dr. Henrene Pastor consulted for input on colonoscopy and expressed concerns as prior attempts at colonoscopy unsuccessful due to sigmoid stenosis. He recommended capsule endoscopy for work up with transfusion as indicated and resume AC if H/H stable.Swallow evaluation done and negative for signs of dysphagia. Capsule endoscopy done yesterday and eliquis resumed as no signs of active bleeding noted. Therapy ongoing and CIR recommended due to deconditioned state.    Assessment:   1. Falls  2. Anemia on admit 7.2>8.1> 8.4 today 3. Chronic Systolic HF- ICM- ECHO 9/0/3009 EF 40-45%. Continue BB, hydralazine/nitrates and torsemide 4. PAF on chronic amiodarone and eliquis- restarted on Eliquis 2.5 BID 5. DM 6. H/O VT - continue amiodarone 7. Anemia - no signs of active bleeding. Hg stable. GI will communicate capsule study findings with pt once they are available. 8. Chronic Respiratory Failure- on 2 liters [place] at home  9. Hypothroid 10. HCAP- abx per IM. Will be converted to PO before discharge.  11. CKD- Creatinine baseline 1.8-2.0 . Creat 1.73 today. 12. Hyponatremia - 125 on admit. 129 today.   Plan/Discussion:     HF stable. Hgb stable back on apixaban 2.5 bid. Continue po torsemide 40mg  po qd.  Continue BB and hydralazine/imdur. No ARB/ACE due to CKD. Replete K per primary team.    Dr. Haroldine Laws to finalize medication regimen but stable for inpatient rehab today.   Length of Stay: 3   Eileen Stanford PA-C  10/13/2014, 12:36 PM  Advanced Heart Failure Team Pager [phone] (M-F; 7a - 4p)  Please contact [facility] for night-coverage after hours (4p -7a ) and weekends on [email or web address]   Patient seen and examined with Nell Range, PA-C. We discussed all aspects of the encounter. I agree with the assessment and plan  as stated above.  He is stable. Hgb ok. Await results of capsule study. Continue apixaban. Be careful not to overdiurese. We will follow.   Nikolos Billig,MD 4:42 PM

## 2014-10-13 NOTE — H&P (Signed)
Expand All Collapse All      Physical Medicine and Rehabilitation Admission H&P   Chief Complaint  Patient presents with  . Gait disorder    HPI: [name] is a 79 y.o. male with history of CAD, VT/ICD, DM type 2, COPD-oxygen HS, chronic systolic CHF-milrinone, recent admission 08/2014 for GIB, candida esophagitis and PNA who was readmitted 10/09/14 with worsening of weakness, increasing DOE and falls. CT head without acute changes. Patient was noted to be anemic with Hgb 7.2, positive FOBT and CXR showed evidence of volume overload and PNA. He was transfused with 1 units PRBC and started on IV anbiotics for PNA. Dr. Haroldine Laws consulted for input on chronic AC held and diuretics resumed to help with fluid overload. Dr. Henrene Pastor consulted for input on colonoscopy and expressed concerns as prior attempts at colonoscopy unsuccessful due to sigmoid stenosis. He recommended capsule endoscopy for work up with transfusion as indicated and resume AC if H/H stable. Swallow evaluation done and negative for signs of dysphagia. Capsule endoscopy done yesterday and eliquis resumed as no signs of active bleeding noted. Therapy ongoing and CIR recommended due to deconditioned state.    Review of Systems  HENT: Negative for hearing loss.  Eyes: Negative for blurred vision and double vision.  Respiratory: Positive for sputum production (thick yellow).  Cardiovascular: Negative for chest pain and palpitations.  Gastrointestinal: Negative for heartburn, nausea, abdominal pain and constipation.  Genitourinary: Negative for urgency and frequency.  Musculoskeletal: Negative for myalgias and back pain.  Neurological: Positive for weakness. Negative for dizziness, focal weakness and headaches.      Past Medical History  Diagnosis Date  . VENTRICULAR TACHYCARDIA   . PERIPHERAL VASCULAR DISEASE   . HYPERTENSION     Hypertensive retinopathy-grade II OU  .  HYPERCHOLESTEROLEMIA   . CORONARY ARTERY DISEASE     Hx of CABG  . Chronic systolic heart failure     Ischemic cardiomyopathy (01/2014 EF 40-45% with hypokinesis + small area of akinesis  . SEBORRHEIC DERMATITIS   . Chronic renal insufficiency, stage III (moderate)     CrCl 40s-50s  . DIVERTICULOSIS OF COLON   . COPD, severe   . COLONIC POLYPS   . ANXIETY   . ICD (implantable cardiac defibrillator) in place   . Type II diabetes mellitus     +background diabetic retinopathy OU  . Chronic lower back pain   . DJD (degenerative joint disease)     "hands" (05/24/2012)  . Osteoarthritis of finger   . History of atrial flutter   . Thrombus 12/2013    on ICD lead--started anticoag and his cardioversion for atrial flutter was postponed.  . Colon stricture 2015    with features worrisome for mass, also with recent rising CEA level (possible colon cancer)-GI MD= Dr. Franki Cabot recent eval/follow up with Dr. Earlean Shawl was summer 2014, at which time he recommended colonoscopy but pt declined.  . Macular degeneration, age related, nonexudative     OU    Past Surgical History  Procedure Laterality Date  . Coronary artery bypass graft  1990's    CABG X4  . Tonsillectomy  ?1942  . Cardiac defibrillator placement  12/2004    single chamber defibrillator- Medtronic Maxima 8/06 DrKlein [Other][  . Tee without cardioversion N/A 12/22/2013    Procedure: TRANSESOPHAGEAL ECHOCARDIOGRAM (TEE); Surgeon: Thayer Headings, MD; Location: [place]; Service: Cardiovascular; Laterality: N/A;  . Cardioversion N/A 12/22/2013    Procedure: CARDIOVERSION; Surgeon: Thayer Headings, MD; Location:  [facility]; Service: Cardiovascular; Laterality: N/A;  . Cardiovascular stress test  01/12/13    myocard perf imaging: previous infarct with a moderately reduced EF as was known previously. No  new findings.   . Gastric emptying scan  02/02/14    Normal  . Transthoracic echocardiogram  01/25/14; 04/2014    EF 40-45%, diffuse hypokinesis, infero-basilar myocardial akinesis, PA pressure slightly high, valves fine.04/2014-Echo today EF ~40% inferior AK moderate to severe MR  . Abdominal ultrasound  01/2014    GB sludge, o/w normal  . Ct virtual colonoscopy diagnostic  11/2012    Asymmetric thickening of the rectosigmoid colon worrisome for  . Cardiovascular stress test      Lexiscan: There is a medium sized fixed inferior wall defect of moderate severity involving the apical inferior and mid inferior and basal inferoseptal segments, consistent with old inferior MI. No significant reversible ischemia. EF 34%, inferior wall hypokinesis--intermediate risk scan (ok to do planned procedure)  . Implantable cardioverter defibrillator revision N/A 05/25/2012    Procedure: IMPLANTABLE CARDIOVERTER DEFIBRILLATOR REVISION; Surgeon: Evans Lance, MD; Location: [facility] CATH LAB; Service: Cardiovascular; Laterality: N/A;  . Right heart catheterization N/A 08/31/2014    Procedure: RIGHT HEART CATH; Surgeon: Larey Dresser, MD; Location: [facility] CATH LAB; Service: Cardiovascular; Laterality: N/A;  . Esophagogastroduodenoscopy N/A 09/03/2014    Procedure: ESOPHAGOGASTRODUODENOSCOPY (EGD); Surgeon: Carol Ada, MD; Location: [facility] ENDOSCOPY; Service: Endoscopy; Laterality: N/A;  . Givens capsule study N/A 10/12/2014    Procedure: GIVENS CAPSULE STUDY; Surgeon: Irene Shipper, MD; Location: [place]; Service: Endoscopy; Laterality: N/A;    Family History  Problem Relation Age of Onset  . Heart attack Father 8  . Heart attack Brother     Vague history    Social History: Married. 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 Independent without AD prior to 08/2014 admission. He reports that he quit  smoking about 14 years ago. His smoking use included Cigarettes. He has a 100 pack-year smoking history. He has never used smokeless tobacco. He reports that he drinks a beer daily. He reports that he does not use illicit drugs.    Allergies  Allergen Reactions  . Niacin Itching    Niaspan     Medications Prior to Admission  Medication Sig Dispense Refill  . amiodarone (PACERONE) 200 MG tablet Take 1 tablet (200 mg total) by mouth daily. 30 tablet 0  . apixaban (ELIQUIS) 5 MG TABS tablet Take 1 tablet (5 mg total) by mouth 2 (two) times daily. 60 tablet 0  . carvedilol (COREG) 3.125 MG tablet Take 1 tablet (3.125 mg total) by mouth 2 (two) times daily with a meal. 60 tablet 0  . chlorhexidine (PERIDEX) 0.12 % solution Use as directed 5 mLs in the mouth or throat 2 (two) times daily.     . fexofenadine (ALLEGRA) 180 MG tablet Take 180 mg by mouth daily as needed for allergies or rhinitis.    . Fluticasone-Salmeterol (ADVAIR) 500-50 MCG/DOSE AEPB Inhale 1 puff into the lungs 2 (two) times daily. 60 each 11  . folic acid (FOLVITE) 1 MG tablet Take 1 tablet (1 mg total) by mouth daily. 30 tablet 0  . hydrALAZINE (APRESOLINE) 25 MG tablet Take 25 mg by mouth 3 (three) times daily.    . Insulin Glargine (LANTUS SOLOSTAR) 100 UNIT/ML Solostar Pen 10 units SQ qhs (Patient taking differently: Inject 10 Units into the skin at bedtime. ) 15 mL 11  . insulin lispro (HUMALOG KWIKPEN) 100 UNIT/ML KiwkPen Take  7 units in the morning, 8 units at noon, and 9 units at bedtime. (Patient taking differently: Inject into the skin. Take 7 units in the morning, 8 units at noon, and 9 units at dinner.) 5 pen 3  . isosorbide mononitrate (IMDUR) 30 MG 24 hr tablet TAKE ONE TABLET BY MOUTH DAILY 30 tablet 0  . levofloxacin (LEVAQUIN) 750 MG tablet Take 1 tablet (750 mg total) by mouth every other day. 3 tablet 0  . levothyroxine (SYNTHROID,  LEVOTHROID) 75 MCG tablet Take 1 tablet (75 mcg total) by mouth daily. 30 tablet 3  . metolazone (ZAROXOLYN) 2.5 MG tablet Take on mondays and Fridays (Patient taking differently: Take 2.5 mg by mouth 2 (two) times daily. Take on mondays and Fridays) 60 tablet 3  . pantoprazole (PROTONIX) 40 MG tablet Take 1 tablet (40 mg total) by mouth 2 (two) times daily. 60 tablet 6  . potassium chloride SA (KLOR-CON M20) 20 MEQ tablet Take 1 tablet (20 mEq total) by mouth 2 (two) times daily. 90 tablet 3  . SPIRIVA HANDIHALER 18 MCG inhalation capsule PLACE ONE CAPSULE INTO INHALER AND INHALE DAILY (Patient taking differently: PLACE ONE CAPSULE INTO INHALER AND every evening.) 30 capsule 0  . thiamine 100 MG tablet Take 1 tablet (100 mg total) by mouth daily. 30 tablet 0  . tobramycin (TOBREX) 0.3 % ophthalmic solution Place 2 drops into both eyes every 6 (six) hours. 5 mL 0  . torsemide (DEMADEX) 20 MG tablet Take 3 tablets (60 mg total) by mouth daily. (Patient taking differently: Take 20 mg by mouth 2 (two) times daily. ) 30 tablet 0  . pravastatin (PRAVACHOL) 80 MG tablet Take 80 mg by mouth at bedtime.       Home: Home Living Family/patient expects to be discharged to:: Private residence Living Arrangements: Spouse/significant other Available Help at Discharge: Family, Available 24 hours/day Type of Home: House Home Access: Stairs to enter [employer] of Steps: 2 Home Layout: One level [place]: Environmental consultant - 2 wheels, Grab bars - tub/shower  Functional History: Prior Function Level of Independence: Needs assistance Gait / Transfers Assistance Needed: has been falling at home, but before last admission in 4/16, was independent without AD ADL's / Homemaking Assistance Needed: wife assists with compression hose and starting pants over feet, pt reports showering in standing independently, wife performs cooking and cleaning Comments: pt  somewhat lethargic and did not offer much information on eval and questioned validity of some answers  Functional Status:  Mobility: Bed Mobility Overal bed mobility: Needs Assistance Bed Mobility: Supine to Sit, Sit to Supine Supine to sit: Min guard Sit to supine: Min guard General bed mobility comments: no physical assist, used rail, extra time Transfers Overall transfer level: Needs assistance Equipment used: Rolling walker (2 wheeled) Transfers: Sit to/from Stand Sit to Stand: Min assist Stand pivot transfers: Min assist General transfer comment: cues for hand placement, steadying assist Ambulation/Gait Ambulation/Gait assistance: Min assist Ambulation Distance (Feet): 90 Feet Assistive device: Rolling walker (2 wheeled) General Gait Details: Very slow, guarded gait with RW. Assist due to unsteady gait. Patient fatigues quickly, requiring 2 standing rest breaks for 90'. Gait Pattern/deviations: Step-through pattern, Decreased stride length, Trunk flexed Gait velocity: decreased Gait velocity interpretation: Below normal speed for age/gender    ADL: ADL Overall ADL's : Needs assistance/impaired Eating/Feeding: Independent, Sitting Eating/Feeding Details (indicate cue type and reason): opened packaging independently Grooming: Wash/dry hands, Sitting, Set up Upper Body Bathing: Supervision/ safety, Sitting Lower Body Bathing:  Sit to/from stand, Moderate assistance Upper Body Dressing : Minimal assistance, Sitting Upper Body Dressing Details (indicate cue type and reason): front opening gown Lower Body Dressing: Sit to/from stand, Moderate assistance Toilet Transfer: Minimal assistance, Stand-pivot, RW Toilet Transfer Details (indicate cue type and reason): simulated bed to chair Toileting- Clothing Manipulation and Hygiene: Sit to/from stand, Moderate assistance  Cognition: Cognition Overall Cognitive Status: Impaired/Different from baseline Orientation Level:  Oriented X4 Cognition Arousal/Alertness: Awake/alert Behavior During Therapy: Flat affect Overall Cognitive Status: Impaired/Different from baseline Area of Impairment: Memory, Problem solving Memory: Decreased short-term memory Following Commands: Follows one step commands consistently, Follows one step commands with increased time, Follows multi-step commands with increased time Safety/Judgement: Decreased awareness of safety, Decreased awareness of deficits Problem Solving: Slow processing, Requires verbal cues General Comments: per RN, wife had stated that pt has not been himself cognitively. Patient looking for "itch cream" in his bags. He looked twice in each bag, unable to locate. PT located in one of the bags.   Blood pressure 100/34, pulse 65, temperature 98.2 F (36.8 C), temperature source Oral, resp. rate 18, height 5' 7" (1.702 m), weight 65.545 kg (144 lb 8 oz), SpO2 100 %. Physical Exam Nursing note and vitals reviewed. Constitutional: He is oriented to person, place, and time. He appears well-developed. He has a sickly appearance. Nasal cannula in place.  HENT:  Head: Normocephalic and atraumatic.  Eyes: Conjunctivae are normal. Pupils are equal, round, and reactive to light.  Neck: Neck supple.  Cardiovascular: Normal rate and regular rhythm. no murmur Respiratory: Effort normal. No respiratory distress. He has decreased breath sounds in the right lower field and the left lower field. He has wheezes.  Weak congested cough.   GI: Soft. Bowel sounds are normal. He exhibits no distension. There is no tenderness.  Musculoskeletal: He exhibits edema (Min edema BUE with multiple ecchymotic areas.). He exhibits no tenderness.  Neurological: He is alert and oriented to person, place, and time.  Speech clear. Follows basic commands without difficulty. Decreased sensation R-foot. UE: 4/5 prox to distal. LE: 2/5hf, 3+ke and 3+ adf/paf. Cognitively appropriate with normal  memory, insight, and awareness. Skin: Skin is warm and dry.  Psych: pleasant and appropriate.    Lab Results Last 48 Hours    Results for orders placed or performed during the hospital encounter of 10/09/14 (from the past 48 hour(s))  Glucose, capillary Status: Abnormal   Collection Time: 10/11/14 5:10 PM  Result Value Ref Range   Glucose-Capillary 180 (H) 65 - 99 mg/dL   Comment 1 Capillary Specimen   Glucose, capillary Status: Abnormal   Collection Time: 10/11/14 9:12 PM  Result Value Ref Range   Glucose-Capillary 161 (H) 65 - 99 mg/dL   Comment 1 Capillary Specimen   Basic metabolic panel Status: Abnormal   Collection Time: 10/12/14 3:05 AM  Result Value Ref Range   Sodium 127 (L) 135 - 145 mmol/L   Potassium 4.0 3.5 - 5.1 mmol/L   Chloride 90 (L) 101 - 111 mmol/L   CO2 29 22 - 32 mmol/L   Glucose, Bld 134 (H) 65 - 99 mg/dL   BUN 35 (H) 6 - 20 mg/dL   Creatinine, Ser 1.78 (H) 0.61 - 1.24 mg/dL   Calcium 7.8 (L) 8.9 - 10.3 mg/dL   GFR calc non Af Amer 35 (L) >60 mL/min   GFR calc Af Amer 41 (L) >60 mL/min    Comment: (NOTE) The eGFR has been calculated using the CKD EPI equation.  This calculation has not been validated in all clinical situations. eGFR's persistently <60 mL/min signify possible Chronic Kidney Disease.    Anion gap 8 5 - 15  CBC Status: Abnormal   Collection Time: 10/12/14 3:05 AM  Result Value Ref Range   WBC 8.6 4.0 - 10.5 K/uL   RBC 2.71 (L) 4.22 - 5.81 MIL/uL   Hemoglobin 8.2 (L) 13.0 - 17.0 g/dL   HCT 24.6 (L) 39.0 - 52.0 %   MCV 90.8 78.0 - 100.0 fL   MCH 30.3 26.0 - 34.0 pg   MCHC 33.3 30.0 - 36.0 g/dL   RDW 18.1 (H) 11.5 - 15.5 %   Platelets 120 (L) 150 - 400 K/uL  Magnesium Status: Abnormal   Collection Time: 10/12/14 3:05 AM  Result Value Ref Range   Magnesium 1.6 (L) 1.7 - 2.4  mg/dL  Glucose, capillary Status: Abnormal   Collection Time: 10/12/14 7:55 AM  Result Value Ref Range   Glucose-Capillary 127 (H) 65 - 99 mg/dL   Comment 1 Capillary Specimen   Glucose, capillary Status: Abnormal   Collection Time: 10/12/14 4:22 PM  Result Value Ref Range   Glucose-Capillary 190 (H) 65 - 99 mg/dL   Comment 1 Notify RN   Glucose, capillary Status: Abnormal   Collection Time: 10/12/14 9:20 PM  Result Value Ref Range   Glucose-Capillary 142 (H) 65 - 99 mg/dL  CBC Status: Abnormal   Collection Time: 10/13/14 3:50 AM  Result Value Ref Range   WBC 8.1 4.0 - 10.5 K/uL   RBC 2.76 (L) 4.22 - 5.81 MIL/uL   Hemoglobin 8.4 (L) 13.0 - 17.0 g/dL   HCT 24.8 (L) 39.0 - 52.0 %   MCV 89.9 78.0 - 100.0 fL   MCH 30.4 26.0 - 34.0 pg   MCHC 33.9 30.0 - 36.0 g/dL   RDW 17.5 (H) 11.5 - 15.5 %   Platelets 139 (L) 150 - 400 K/uL  Basic metabolic panel Status: Abnormal   Collection Time: 10/13/14 3:50 AM  Result Value Ref Range   Sodium 129 (L) 135 - 145 mmol/L   Potassium 3.3 (L) 3.5 - 5.1 mmol/L    Comment: DELTA CHECK NOTED   Chloride 88 (L) 101 - 111 mmol/L   CO2 31 22 - 32 mmol/L   Glucose, Bld 95 65 - 99 mg/dL   BUN 32 (H) 6 - 20 mg/dL   Creatinine, Ser 1.73 (H) 0.61 - 1.24 mg/dL   Calcium 8.1 (L) 8.9 - 10.3 mg/dL   GFR calc non Af Amer 36 (L) >60 mL/min   GFR calc Af Amer 42 (L) >60 mL/min    Comment: (NOTE) The eGFR has been calculated using the CKD EPI equation. This calculation has not been validated in all clinical situations. eGFR's persistently <60 mL/min signify possible Chronic Kidney Disease.    Anion gap 10 5 - 15  Magnesium Status: None   Collection Time: 10/13/14 3:50 AM  Result Value Ref Range   Magnesium 1.8 1.7 - 2.4 mg/dL  Glucose, capillary Status: None   Collection  Time: 10/13/14 6:21 AM  Result Value Ref Range   Glucose-Capillary 86 65 - 99 mg/dL  Glucose, capillary Status: Abnormal   Collection Time: 10/13/14 11:21 AM  Result Value Ref Range   Glucose-Capillary 164 (H) 65 - 99 mg/dL      Imaging Results (Last 48 hours)    No results found.       Medical Problem List and Plan: 1. Functional deficits secondary  to debility due to PNA/CHF and multiple medical 2. DVT Prophylaxis/Anticoagulation: Pharmaceutical: Other (comment)-Eliquis resumed yesterday 3. Pain Management: N/A 4. Mood: LCSW to follow for evaluation and support.  5. Neuropsych: This patient is capable of making decisions on his own behalf. 6. Skin/Wound Care: routine pressure relief measures.  7. Fluids/Electrolytes/Nutrition: Monitor I/o. Continue nutritional supplements.  8. Chronic systolic CHF: Monitor for signs of overload. Check daily weights. On coreg, hydralazine, Imdur and demadex 9. Recurrent GIB: serial H/H. Will monitor for signs of recurrent bleeding.  10. Severe COPD: Respiratory status stable on budesonide and spiriva. Continue oxygen at nights.  11. CKD: Monitor with routine checks. Avoid nephrotoxic medications. No ACE/ARB due to CKD.  12. PAF: Monitor heart rate tid. Continue amiodarone and  13. DM type 2-controlled: Monitor BS with ac/hs checks. Continue lantus 10 untis at bedtime with SSI for tighter BS control.  14. HTN; Monitor every 8 hours. Continue Hydralazine, imdur, demadex and corege.  15. ABLA: H/H slowly improving. 16. Thrombocytopenia: Continue to monitor platelets as well as for signs of bleeding.  17. Persistent Hypokalemia: Will add daily supplement 18. RLL HCAP: Continues to have thick mucopurulent sputum production with cough--continue mucinex. Encourage IS. On IV vancomycin/Zosyn D#4/--transitioned to augmentin today for 5 additional days. Will check follow up CXR in am. Patient advised to be upright for  meals.     Post Admission Physician Evaluation: 1. Functional deficits secondary to debility due to PNA/CHF and multiple medical 2. Patient is admitted to receive collaborative, interdisciplinary care between the physiatrist, rehab nursing staff, and therapy team. 3. Patient's level of medical complexity and substantial therapy needs in context of that medical necessity cannot be provided at a lesser intensity of care such as a SNF. 4. Patient has experienced substantial functional loss from his/her baseline which was documented above under the "Functional History" and "Functional Status" headings. Judging by the patient's diagnosis, physical exam, and functional history, the patient has potential for functional progress which will result in measurable gains while on inpatient rehab. These gains will be of substantial and practical use upon discharge in facilitating mobility and self-care at the household level. 5. Physiatrist will provide 24 hour management of medical needs as well as oversight of the therapy plan/treatment and provide guidance as appropriate regarding the interaction of the two. 6. 24 hour rehab nursing will assist with bladder management, bowel management, safety, skin/wound care, disease management, medication administration, pain management and patient education and help integrate therapy concepts, techniques,education, etc. 7. PT will assess and treat for/with: Lower extremity strength, range of motion, stamina, balance, functional mobility, safety, adaptive techniques and equipment, activity tolerance, community reintegration. Goals are: mod I. 8. OT will assess and treat for/with: ADL's, functional mobility, safety, upper extremity strength, adaptive techniques and equipment, activity tolerance, ego support, leisure awareness. Goals are: mod I. Therapy may proceed with showering this patient. 9. SLP will assess and treat for/with: n/a. Goals are: n/a. 10. Case  Management and Social Worker will assess and treat for psychological issues and discharge planning. 11. Team conference will be held weekly to assess progress toward goals and to determine barriers to discharge. 12. Patient will receive at least 3 hours of therapy per day at least 5 days per week. 13. ELOS: 7-9 days  14. Prognosis: excellent     Meredith Staggers, MD, [place] 10/13/2014

## 2014-10-13 NOTE — Progress Notes (Signed)
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 Date: A: 07-25-02; B: 08-24-04 Deduct: $1288 Out of Pocket Max: none Life Max: unlimited CIR: 100% SNF: 100% days 1-20; 80% days 21-100 (100 days max) Outpatient: 80/20% Co-Pay: none, no visit limits Home Health: 100% Co-Pay: none DME: 80/20% Co-Pay: none Providers: pt's preference  SECONDARY: AARP Supplement Policy#: [id number] Subscriber: self Benefits: Phone #: [phone]   Emergency Contact Information Contact Information    Name Relation Home Work Mobile   [place] Spouse [phone]  [phone]   [facility] Daughter   [phone]     Current Medical History  Patient Admitting Diagnosis: debility due to PNA/CHF and multiple medical  History of Present Illness: [name] is a 79 y.o. male with history of CAD, VT/ICD, DM type 2, COPD-oxygen HS, chronic systolic CHF-milrinone, recent admission 08/2014 for GIB, candida esophagitis and PNA who was readmitted 10/09/14 with worsening of weakness, increasing DOE and falls. CT head without acute changes. Patient was noted to be anemic with Hgb 7.2, positive FOBT and CXR showed evidence of  volume overload and PNA. He was transfused with 1 units PRBC and started on IV anbiotics for PNA. Dr. Haroldine Laws consulted for input on chronic AC held and diuretics resumed to help with fluid overload. Dr. Henrene Pastor consulted for input on colonoscopy and expressed concerns as prior attempts at colonoscopy unsuccessful due to sigmoid stenosis. He recommended capsule endoscopy for work up with transfusion as indicated and resume AC if H/H stable. Swallow evaluation done and negative for signs of dysphagia. PT evaluation done yesterday and CIR recommended due to deconditioned state.   Past Medical History  Past Medical History  Diagnosis Date  . VENTRICULAR TACHYCARDIA   . PERIPHERAL VASCULAR DISEASE   . HYPERTENSION     Hypertensive retinopathy-grade II OU  . HYPERCHOLESTEROLEMIA   . CORONARY ARTERY DISEASE     Hx of CABG  . Chronic systolic heart failure     Ischemic cardiomyopathy (01/2014 EF 40-45% with hypokinesis + small area of akinesis  . SEBORRHEIC DERMATITIS   . Chronic renal insufficiency, stage III (moderate)     CrCl 40s-50s  . DIVERTICULOSIS OF COLON   . COPD, severe   . COLONIC POLYPS   . ANXIETY   . ICD (implantable cardiac defibrillator) in place   . Type II diabetes mellitus     +background diabetic retinopathy OU  . Chronic lower back pain   . DJD (degenerative joint disease)     "hands" (05/24/2012)  . Osteoarthritis of finger   . History of atrial flutter   . Thrombus 12/2013    on ICD lead--started anticoag and his cardioversion for atrial flutter was postponed.  . Colon stricture 2015    with features worrisome for mass, also with recent rising CEA level (possible colon cancer)-GI MD= Dr. Franki Cabot recent eval/follow up with Dr. Earlean Shawl was summer 2014, at which time he recommended colonoscopy but pt declined.  . Macular degeneration, age related, nonexudative     OU  Family History  family history includes Heart attack in his brother; Heart attack (age of onset: 34) in his father.  Prior Rehab/Hospitalizations: pt had recent home health therapy for general weakness in the past three months.  Current Medications   Current facility-administered medications:  . acetaminophen (TYLENOL) tablet 650 mg, 650 mg, Oral, Q6H PRN **OR** acetaminophen (TYLENOL) suppository 650 mg, 650 mg, Rectal, Q6H PRN, Rise Patience, MD . amiodarone (PACERONE) tablet 200 mg, 200 mg, Oral, Daily, Rise Patience, MD, 200 mg at 10/13/14 1008 . apixaban (ELIQUIS) tablet 2.5 mg, 2.5 mg, Oral, BID, Jolaine Artist, MD, 2.5 mg at 10/13/14 1006 . budesonide (PULMICORT) nebulizer solution 0.25 mg, 0.25 mg, Nebulization, BID, Barton Dubois, MD, 0.25 mg at 10/13/14 0915 . carvedilol (COREG) tablet 3.125 mg, 3.125 mg, Oral, BID WC, Rise Patience, MD, 3.125 mg at 10/13/14 1230 . ceFEPIme (MAXIPIME) 1 g in dextrose 5 % 50 mL IVPB, 1 g, Intravenous, Q24H, Erenest Blank, RPH, 1 g at 10/13/14 1215 . folic acid (FOLVITE) tablet 1 mg, 1 mg, Oral, Daily, Rise Patience, MD, 1 mg at 10/13/14 1009 . guaiFENesin (MUCINEX) 12 hr tablet 600 mg, 600 mg, Oral, BID, Barton Dubois, MD, 600 mg at 10/13/14 1009 . hydrALAZINE (APRESOLINE) tablet 25 mg, 25 mg, Oral, TID, Rise Patience, MD, 25 mg at 10/13/14 1230 . insulin aspart (novoLOG) injection 0-9 Units, 0-9 Units, Subcutaneous, TID WC, Rise Patience, MD, 2 Units at 10/13/14 1216 . insulin glargine (LANTUS) injection 10 Units, 10 Units, Subcutaneous, QHS, Rise Patience, MD, 10 Units at 10/12/14 2140 . isosorbide mononitrate (IMDUR) 24 hr tablet 30 mg, 30 mg, Oral, Daily, Rise Patience, MD, 30 mg at 10/13/14 1230 . levothyroxine (SYNTHROID, LEVOTHROID) tablet 75 mcg, 75 mcg, Oral, QAC breakfast, Rise Patience, MD, 75 mcg at 10/13/14 1006 . ondansetron (ZOFRAN) tablet 4 mg, 4 mg, Oral,  Q6H PRN **OR** ondansetron (ZOFRAN) injection 4 mg, 4 mg, Intravenous, Q6H PRN, Rise Patience, MD . pantoprazole (PROTONIX) EC tablet 40 mg, 40 mg, Oral, Q0600, Charlynne Cousins [place]rib[place], PA-C, 40 mg at 10/13/14 0541 . potassium chloride SA (K-DUR,KLOR-CON) CR tablet 40 mEq, 40 mEq, Oral, Once, Barton Dubois, MD . pravastatin (PRAVACHOL) tablet 80 mg, 80 mg, Oral, q1800, Shaune Pascal Bensimhon, MD . sodium chloride 0.9 % injection 3 mL, 3 mL, Intravenous, Q12H, Rise Patience, MD, 3 mL at 10/13/14 1010 . thiamine (VITAMIN B-1) tablet 100 mg, 100 mg, Oral, Daily, Rise Patience, MD, 100 mg at 10/13/14 1006 . tiotropium (SPIRIVA) inhalation capsule 18 mcg, 18 mcg, Inhalation, Daily, Rise Patience, MD, 18 mcg at 10/13/14 0916 . torsemide (DEMADEX) tablet 40 mg, 40 mg, Oral, Daily, Barton Dubois, MD, 40 mg at 10/13/14 1009 . vancomycin (VANCOCIN) IVPB 750 mg/150 ml premix, 750 mg, Intravenous, Q24H, Erenest Blank, RPH, 750 mg at 10/12/14 2140  Patients Current Diet: Diet heart healthy/carb modified Room service appropriate?: Yes; Fluid consistency:: Thin  Precautions / Restrictions Precautions Precautions: Fall Precaution Comments: Multiple falls pta. Restrictions Weight Bearing Restrictions: No   Prior Activity Level Community (5-7x/wk): Pt gets out everyday with his wife but she does the driving and he has been staying in the car the past few weeks. He has been feeling weaker and his LE's get weak to the point that he needs to sit down. This has limited his community mobility. He enjoys his toy poodle and being with his dtrs/grand-children.   Home Assistive Devices / [employer]  Assistive Devices/Equipment: Environmental consultant (specify type) (walker with wheels) Home Equipment: Walker - 2 wheels, Grab bars - tub/shower  Prior Functional Level Prior Function Level of Independence: Needs assistance Gait / Transfers Assistance Needed: has been falling at home, but before last  admission in 4/16, was independent without AD ADL's / Homemaking Assistance Needed: wife assists with compression hose and starting pants over feet, pt reports showering in standing independently, wife performs cooking and cleaning Comments: pt somewhat lethargic and did not offer much information on eval and questioned validity of some answers  Current Functional Level Cognition  Overall Cognitive Status: Impaired/Different from baseline Orientation Level: Oriented X4 Following Commands: Follows one step commands consistently, Follows one step commands with increased time, Follows multi-step commands with increased time Safety/Judgement: Decreased awareness of safety, Decreased awareness of deficits General Comments: per RN, wife had stated that pt has not been himself cognitively. Patient looking for "itch cream" in his bags. He looked twice in each bag, unable to locate. PT located in one of the bags.   Extremity Assessment (includes Sensation/Coordination)  Upper Extremity Assessment: Generalized weakness  Lower Extremity Assessment: Generalized weakness (tendency for LEs to buckle) RLE Deficits / Details: hip flex 3+/5, knee flex/ ext 3+/5 LLE Deficits / Details: hip flex 3+/5, knee flex/ ext 3+/5. Bilateral LE's fatigue quickly in [place]    ADLs  Overall ADL's : Needs assistance/impaired Eating/Feeding: Independent, Sitting Eating/Feeding Details (indicate cue type and reason): opened packaging independently Grooming: Wash/dry hands, Sitting, Set up Upper Body Bathing: Supervision/ safety, Sitting Lower Body Bathing: Sit to/from stand, Moderate assistance Upper Body Dressing : Minimal assistance, Sitting Upper Body Dressing Details (indicate cue type and reason): front opening gown Lower Body Dressing: Sit to/from stand, Moderate assistance Toilet Transfer: Minimal assistance, Stand-pivot, RW Toilet Transfer Details (indicate cue type and reason): simulated bed to  chair Toileting- Clothing Manipulation and Hygiene: Sit to/from stand, Moderate assistance    Mobility  Overal bed mobility: Needs Assistance Bed Mobility: Supine to Sit, Sit to Supine Supine to sit: Min guard Sit to supine: Min guard General bed mobility comments: no physical assist, used rail, extra time    Transfers  Overall transfer level: Needs assistance Equipment used: Rolling walker (2 wheeled) Transfers: Sit to/from Stand Sit to Stand: Min assist Stand pivot transfers: Min assist General transfer comment: cues for hand placement, steadying assist    Ambulation / Gait / Stairs / Wheelchair Mobility  Ambulation/Gait Ambulation/Gait assistance: Min assist Ambulation Distance (Feet): 90 Feet Assistive device: Rolling walker (2 wheeled) General Gait Details: Very slow, guarded gait with RW. Assist due to unsteady gait. Patient fatigues quickly, requiring 2 standing rest breaks for 90'. Gait Pattern/deviations: Step-through pattern, Decreased stride length, Trunk flexed Gait velocity: decreased Gait velocity interpretation: Below normal speed for age/gender    Posture / Balance Balance Overall balance assessment: Needs assistance Sitting-balance support: No upper extremity supported, Feet supported Sitting balance-Leahy Scale: Fair Standing balance support: Bilateral upper extremity supported Standing balance-Leahy Scale: Poor Standing balance comment: can release one hand from walker in standing, but feels uncomfortable due to LE weakness    Special needs/care consideration BiPAP/CPAP no CPM no  Continuous Drip IV no  Dialysis no  Life Vest no  Oxygen no Special Bed no Trach Size no Wound Vac (area) no  Skin - pt with fragile skin, bruises easily and has bruises on L UE and hand  Bowel mgmt: last BM on 10-13-14 Bladder mgmt: currently using urinal Diabetic mgmt - managed at home  with insulin   Previous  Home Environment Living Arrangements: Spouse/significant other Available Help at Discharge: Family, Available 24 hours/day Type of Home: House Home Layout: One level Home Access: Stairs to enter [employer] of Steps: 2 Bathroom Shower/Tub: Multimedia programmer: [place]: Yes  Discharge Living Setting Plans for Discharge Living Setting: Patient's home (pt states they are building a 1 level home in [place] as well.) Type of Home at Discharge: House Discharge Home Layout: One level Discharge Home Access: Stairs to enter Technical brewer of Steps: 2 Discharge Bathroom Shower/Tub: Walk-in shower Discharge Bathroom Toilet: Standard Does the patient have any problems obtaining your medications?: No  Social/Family/Support Systems Patient Roles: Spouse (enjoys being with his grandchildren) Sport and exercise psychologist Information: wife [name] is Audiological scientist Anticipated Caregiver: wife (and two dtrs are local and supportive as well) Anticipated Caregiver's Contact Information: see above Ability/Limitations of Caregiver: no limitations Caregiver Availability: 24/7 Discharge Plan Discussed with Primary Caregiver: Yes Is Caregiver In Agreement with Plan?: Yes Does Caregiver/Family have Issues with Lodging/Transportation while Pt is in Rehab?: No  Goals/Additional Needs Patient/Family Goal for Rehab: Mod Ind and Supervision with PT, Mod Ind and Supervision and Min assist with OT, NA for SLP Expected length of stay: 12-18 days Cultural Considerations: none Equipment Needs: to be determined Pt/Family Agrees to Admission and willing to participate: Yes (spoke with pt's wife by phone on 10-12-14) Program Orientation Provided & Reviewed with Pt/Caregiver Including Roles & Responsibilities: Yes   Decrease burden of Care through IP rehab admission: NA  Possible need for SNF placement upon discharge: not anticipated  Patient Condition: This patient's condition remains  as documented in the consult dated 10-12-14, in which the Rehabilitation Physician determined and documented that the patient's condition is appropriate for intensive rehabilitative care in an inpatient rehabilitation facility. Will admit to inpatient rehab today.  Preadmission Screen Completed By: Nanetta Batty, PT, 10/13/2014 2:05 PM ______________________________________________________________________  Discussed status with Dr. Naaman Plummer on 10-13-14 at 1403 and received telephone approval for admission today.  Admission Coordinator: Nanetta Batty, PT, time1403/Date 10-13-14          Cosigned by: Meredith Staggers, MD at 10/13/2014 2:11 PM  Revision History     Date/Time User Provider Type Action   10/13/2014 2:11 PM Meredith Staggers, MD Physician Cosign   10/13/2014 2:05 PM [facility] Admission Coordinator Sign

## 2014-10-13 NOTE — Discharge Summary (Signed)
Physician Discharge Summary  [name] TGG:[id number] DOB: [birth year] DOA: 10/09/2014  PCP: Tammi Sou, MD  Admit date: 10/09/2014 Discharge date: 10/13/2014  Time spent: >30 minutes  Recommendations for Outpatient Follow-up:  Close follow up to CBC for Hgb trend Repeat BMET in 3-5 days to follow renal function Heart healthy diet Take medications as prescribed Heart failure team and GI will follow patient at CIR  Discharge Diagnoses:  Principal Problem:   GI bleed Active Problems:   Essential hypertension   COPD, severe   Chronic systolic CHF (congestive heart failure), NYHA class 4   CKD (chronic kidney disease) stage 3, GFR 30-59 ml/min   Anemia   Diabetes mellitus type 2, controlled   Heme positive stool   Anemia due to chronic blood loss   Chronic anticoagulation   Weakness   Chronic respiratory failure with hypoxia   Discharge Condition: stable and improved. Discharge to CIR for further care and rehab  Diet recommendation: heart healthy diet (less than 2 gram daily)  Filed Weights   10/12/14 0428 10/13/14 0200 10/13/14 0537  Weight: 68.811 kg (151 lb 11.2 oz) 67.1 kg (147 lb 14.9 oz) 65.545 kg (144 lb 8 oz)    History of present illness:  79 y.o. male with history of CAD status post CABG, chronic atrial fibrillation on Apixaban, ischemic cardiomyopathy, diabetes mellitus, chronic kidney disease, COPD presents to the ER because of increasing weakness and fatigue. Patient states he has been feeling weak and fatigue over the last couple of weeks and has had recurrent falls. Patient states he has hit his head but denies losing consciousness. Denies any chest pain or shortness of breath. In the ER patient also complained of upper back pain. CT head and x-ray spine was unremarkable. Patient's hemoglobin is around 7 which is a drop of 3 g from previous from recent discharge. Patient was recently admitted for GI bleed and also pneumonia. During the admission for GI  bleed patient had EGD which showed Candida esophagitis. Patient at this time his stool for occult blood positive but patient states he has not noticed any frank bleeding or black stools. Patient has been I some productive cough and chest x-ray shows possible pneumonia for which patient has been started on empiric antibiotics.   Hospital Course:  1. Falls/weakness and deconditioning/HCAP: most likely due to anemia and HCAP -will transition to augmentin to complete antibiotic therapy  -PRN transfusion; Hgb stable at 8.4 at discharge -follow capsule endoscopy results, finish antibiotics and closely monitor Hgb to assess for further symptomatic anemia -per PT recommendations will transfer to CIR for rehab at discharge  2. Anemia due to iron deficiency in GIB: on admit Hgb 7.2>8.1>8.2 and at discharge 8.4 -received 2 units of PRBC's on this admission  -GI to provide further rec's and currently s/p capsule endoscopy (results pending) -will monitor Hgb  3. Chronic Systolic HF- ICM- ECHO 7/0/3500 EF 40-45% . -heart failure team on board; will follow their rec's -patient with good diuresis overnight; no SOB and asymptomatic -Cr stable and at baseline  -will continue PO torsemide -no ACE/ARB given CKD  4. PAF on chronic amiodarone and eliquis -eliquis was initially on hold; following cardiology rec's will continue low dose of 2.5mg  BID (resumed on 5/18) -capsule endoscopy planned done on 5/19; final results and further recommendation per GI -no actively bleeding and Hgb trending up -continue PPI  5. DM: will continue SSI  6. COPD with chronic Respiratory Failure- on 2 liters [place] at home:  -  will continue flutter valve -continue oxygen supplementation -denies major SOB  7. HCAP: received Vanc and cefepime for 3 days; now transition to Augmentin to complete therapy -continue flutter valve and resume PRN inhalers and Advair -continue mucinex  8. Hypothroid: continue synthroid  9.CKD stage  3 at baseline-  -Creatinine baseline 1.8-2.0  -Cr back to baseline (1.73); will need BMET in 3-5 days to monitor trend  Procedures:  See below for x-ray reports  Capsule endoscopy planned for 5/19  Consultations:  Cardiology (Heart failure service)  GI  CIR  Discharge Exam: Filed Vitals:   10/13/14 1220  BP: 109/45  Pulse: 57  Temp:   Resp:     General: Patient is afebrile, continue to have intermittent cough (but endorses that is better). Denies CP and/or significant SOB. Was laying down flat on bed on my exam and denies any orthopnea.  Cardiovascular: no rubs, no gallops, rate controlled; no murmurs; no JVD seen on exam.   Respiratory: decrease BS at bases, scattered rhonchi, no wheezing; no frank crackles  Abdomen: soft, NT, ND, positive BS  Musculoskeletal: 1++ edema, no cyanosis or clubbing   Discharge Instructions   Discharge Instructions    Diet - low sodium heart healthy    Complete by:  As directed      Discharge instructions    Complete by:  As directed   Close follow up to CBC for Hgb trend Repeat BMET in 3-5 days to follow renal function Heart healthy diet Take medications as prescribed Heart failure team and GI will follow patient at CIR          Current Discharge Medication List    START taking these medications   Details  amoxicillin-clavulanate (AUGMENTIN) 500-125 MG per tablet Take 1 tablet (500 mg total) by mouth 3 (three) times daily. To be taken until 5/25 to complete antibiotic therapy    guaiFENesin (MUCINEX) 600 MG 12 hr tablet Take 1 tablet (600 mg total) by mouth 2 (two) times daily.      CONTINUE these medications which have CHANGED   Details  apixaban (ELIQUIS) 2.5 MG TABS tablet Take 1 tablet (2.5 mg total) by mouth 2 (two) times daily.      CONTINUE these medications which have NOT CHANGED   Details  amiodarone (PACERONE) 200 MG tablet Take 1 tablet (200 mg total) by mouth daily. Qty: 30 tablet, Refills: 0     carvedilol (COREG) 3.125 MG tablet Take 1 tablet (3.125 mg total) by mouth 2 (two) times daily with a meal. Qty: 60 tablet, Refills: 0    chlorhexidine (PERIDEX) 0.12 % solution Use as directed 5 mLs in the mouth or throat 2 (two) times daily.     fexofenadine (ALLEGRA) 180 MG tablet Take 180 mg by mouth daily as needed for allergies or rhinitis.    Fluticasone-Salmeterol (ADVAIR) 500-50 MCG/DOSE AEPB Inhale 1 puff into the lungs 2 (two) times daily. Qty: 60 each, Refills: 11    folic acid (FOLVITE) 1 MG tablet Take 1 tablet (1 mg total) by mouth daily. Qty: 30 tablet, Refills: 0    hydrALAZINE (APRESOLINE) 25 MG tablet Take 25 mg by mouth 3 (three) times daily.    Insulin Glargine (LANTUS SOLOSTAR) 100 UNIT/ML Solostar Pen 10 units SQ qhs Qty: 15 mL, Refills: 11    insulin lispro (HUMALOG KWIKPEN) 100 UNIT/ML KiwkPen Take 7 units in the morning, 8 units at noon, and 9 units at bedtime. Qty: 5 pen, Refills: 3    isosorbide  mononitrate (IMDUR) 30 MG 24 hr tablet TAKE ONE TABLET BY MOUTH DAILY Qty: 30 tablet, Refills: 0    levothyroxine (SYNTHROID, LEVOTHROID) 75 MCG tablet Take 1 tablet (75 mcg total) by mouth daily. Qty: 30 tablet, Refills: 3    metolazone (ZAROXOLYN) 2.5 MG tablet Take on mondays and Fridays Qty: 60 tablet, Refills: 3    pantoprazole (PROTONIX) 40 MG tablet Take 1 tablet (40 mg total) by mouth 2 (two) times daily. Qty: 60 tablet, Refills: 6    potassium chloride SA (KLOR-CON M20) 20 MEQ tablet Take 1 tablet (20 mEq total) by mouth 2 (two) times daily. Qty: 90 tablet, Refills: 3    SPIRIVA HANDIHALER 18 MCG inhalation capsule PLACE ONE CAPSULE INTO INHALER AND  INHALE  DAILY Qty: 30 capsule, Refills: 0    thiamine 100 MG tablet Take 1 tablet (100 mg total) by mouth daily. Qty: 30 tablet, Refills: 0    tobramycin (TOBREX) 0.3 % ophthalmic solution Place 2 drops into both eyes every 6 (six) hours. Qty: 5 mL, Refills: 0    torsemide (DEMADEX) 20 MG tablet  Take 3 tablets (60 mg total) by mouth daily. Qty: 30 tablet, Refills: 0    pravastatin (PRAVACHOL) 80 MG tablet Take 80 mg by mouth at bedtime.       STOP taking these medications     levofloxacin (LEVAQUIN) 750 MG tablet        Allergies  Allergen Reactions  . Niacin Itching    Niaspan    Follow-up Information    Follow up with MEDOFF,JEFFREY R, MD.   Specialty:  Gastroenterology   Why:  see as needed for follow up of intestinal or stomach problems   Contact information:   [place] [address] [phone]       The results of significant diagnostics from this hospitalization (including imaging, microbiology, ancillary and laboratory) are listed below for reference.    Significant Diagnostic Studies: Dg Chest 2 View  10/09/2014   CLINICAL DATA:  Weakness, shortness of breath.  History of COPD.  EXAM: CHEST  2 VIEW  COMPARISON:  09/11/2014  FINDINGS: Lungs are hyperinflated with emphysema. There are bilateral calcified pleural plaques. Dual lead left-sided pacemaker remains in place. Patient is post median sternotomy. Stable cardiomegaly. No pulmonary edema. Improved right upper lobe opacity compared to prior. Increased opacity at the right costophrenic angle may reflect new focal consolidation, atelectasis, or less likely pleural effusion. No left pleural effusion. No pneumothorax. No acute osseous abnormalities are seen.  IMPRESSION: Increasing opacity at the right costophrenic angle that is new/ progressed from prior exams and may reflect atelectasis, pneumonia or less likely pleural effusion.  Underlying chronic lung disease, bilateral pleural plaques and emphysema. Stable cardiomegaly.   Electronically Signed   By: Jeb Levering M.D.   On: 10/09/2014 23:23   Dg Thoracic Spine W/swimmers  10/09/2014   CLINICAL DATA:  Acute onset of chronic upper back pain. Initial encounter.  EXAM: THORACIC SPINE - 2 VIEW + SWIMMERS  COMPARISON:  Chest radiograph  performed 09/11/2014  FINDINGS: There is no evidence of fracture or subluxation. Vertebral bodies demonstrate normal height and alignment. Mild degenerative change is noted along the cervical spine.  The visualized portions of both lungs are clear. The mediastinum is borderline normal in size. The patient is status post median sternotomy, with evidence of prior CABG. AICD leads are noted.  IMPRESSION: No evidence of fracture or subluxation along the cervical spine.   Electronically Signed  By: Garald Balding M.D.   On: 10/09/2014 23:22   Dg Lumbar Spine 2-3 Views  10/09/2014   CLINICAL DATA:  Chronic lower back pain.  Initial encounter.  EXAM: LUMBAR SPINE - 2-3 VIEW  COMPARISON:  CT of the abdomen and pelvis performed 12/07/2012  FINDINGS: There is no evidence of acute fracture or subluxation. Vertebral bodies demonstrate normal height. There is grade 1 anterolisthesis of L3 on L4. Mild disc space narrowing is noted at L2-L3 and L5-S1. Associated endplate sclerotic change is noted.  The visualized bowel gas pattern is unremarkable in appearance; air and stool are noted within the colon. The sacroiliac joints are within normal limits. Scattered vascular calcifications are seen.  IMPRESSION: 1. No evidence of acute fracture or subluxation along the lumbar spine. 2. Mild degenerative change noted along the lumbar spine. 3. Scattered vascular calcifications seen.   Electronically Signed   By: Garald Balding M.D.   On: 10/09/2014 23:24   Ct Head Wo Contrast  10/09/2014   CLINICAL DATA:  Acute onset of generalized weakness. Back pain. Frequent falls. Worsening confusion. Initial encounter.  EXAM: CT HEAD WITHOUT CONTRAST  TECHNIQUE: Contiguous axial images were obtained from the base of the skull through the vertex without intravenous contrast.  COMPARISON:  CT of the neck performed 03/11/2007  FINDINGS: There is no evidence of acute infarction, mass lesion, or intra- or extra-axial hemorrhage on CT.  Prominence  of the sulci suggests mild cortical volume loss. Mild cerebellar atrophy is noted. Mild periventricular white matter change likely reflects small vessel ischemic microangiopathy.  The brainstem and fourth ventricle are within normal limits. The third and lateral ventricles, and basal ganglia are unremarkable in appearance. The cerebral hemispheres are symmetric in appearance, with normal gray-white differentiation. No mass effect or midline shift is seen.  There is no evidence of fracture; visualized osseous structures are unremarkable in appearance. The orbits are within normal limits. The paranasal sinuses and mastoid air cells are well-aerated. No significant soft tissue abnormalities are seen.  IMPRESSION: 1. No acute intracranial pathology seen on CT. 2. Mild cortical volume loss and scattered small vessel ischemic microangiopathy.   Electronically Signed   By: Garald Balding M.D.   On: 10/09/2014 23:46    Microbiology: Recent Results (from the past 240 hour(s))  Urine culture     Status: None   Collection Time: 10/10/14 12:14 AM  Result Value Ref Range Status   Specimen Description URINE, CLEAN CATCH  Final   Special Requests NONE  Final   Colony Count   Final    20,OOO COLONIES/ML Performed at [facility]    Culture   Final    STAPHYLOCOCCUS SPECIES (COAGULASE NEGATIVE) Note: RIFAMPIN AND GENTAMICIN SHOULD NOT BE USED AS SINGLE DRUGS FOR TREATMENT OF STAPH INFECTIONS. Performed at [facility]    Report Status 10/13/2014 FINAL  Final   Organism ID, Bacteria STAPHYLOCOCCUS SPECIES (COAGULASE NEGATIVE)  Final      Susceptibility   Staphylococcus species (coagulase negative) - MIC*    GENTAMICIN <=0.5 SENSITIVE Sensitive     LEVOFLOXACIN >=8 RESISTANT Resistant     NITROFURANTOIN <=16 SENSITIVE Sensitive     OXACILLIN >=4 RESISTANT Resistant     PENICILLIN >=0.5 RESISTANT Resistant     RIFAMPIN <=0.5 SENSITIVE Sensitive     TRIMETH/SULFA <=10 SENSITIVE Sensitive      VANCOMYCIN 1 SENSITIVE Sensitive     TETRACYCLINE <=1 SENSITIVE Sensitive     * STAPHYLOCOCCUS SPECIES (COAGULASE NEGATIVE)  MRSA PCR Screening     Status: None   Collection Time: 10/10/14  1:49 AM  Result Value Ref Range Status   MRSA by PCR NEGATIVE NEGATIVE Final    Comment:        The GeneXpert MRSA Assay (FDA approved for NASAL specimens only), is one component of a comprehensive MRSA colonization surveillance program. It is not intended to diagnose MRSA infection nor to guide or monitor treatment for MRSA infections.      Labs: Basic Metabolic Panel:  Recent Labs Lab 10/09/14 2237 10/10/14 0529 10/11/14 0238 10/12/14 0305 10/13/14 0350  NA 125* 126* 126* 127* 129*  K 2.6* 2.9* 4.4 4.0 3.3*  CL 78* 86* 89* 90* 88*  CO2 33* 32 29 29 31   GLUCOSE 99 123* 129* 134* 95  BUN 54* 48* 40* 35* 32*  CREATININE 2.31* 2.02* 1.85* 1.78* 1.73*  CALCIUM 8.3* 7.8* 8.1* 7.8* 8.1*  MG  --  1.5*  --  1.6* 1.8   Liver Function Tests:  Recent Labs Lab 10/09/14 2237 10/10/14 0529 10/11/14 0238  AST 81* 75* 77*  ALT 22 21 21   ALKPHOS 90 82 84  BILITOT 1.2 2.6* 1.4*  PROT 5.8* 5.0* 5.3*  ALBUMIN 2.4* 2.1* 2.1*   CBC:  Recent Labs Lab 10/09/14 2237 10/10/14 0529 10/11/14 0238 10/12/14 0305 10/13/14 0350  WBC 9.8 12.6* 10.5 8.6 8.1  NEUTROABS 6.5 9.1*  --   --   --   HGB 7.2* 8.1* 8.0* 8.2* 8.4*  HCT 20.8* 23.4* 23.9* 24.6* 24.8*  MCV 89.3 88.3 89.5 90.8 89.9  PLT 125* 122* 137* 120* 139*   BNP (last 3 results)  Recent Labs  08/30/14 1639 09/09/14 1150 10/09/14 2237  BNP 800.7* 550.7* 715.8*    ProBNP (last 3 results)  Recent Labs  01/24/14 1909 02/09/14 0945  PROBNP 6721.0* 2555.0*    CBG:  Recent Labs Lab 10/12/14 0755 10/12/14 1622 10/12/14 2120 10/13/14 0621 10/13/14 1121  GLUCAP 127* 190* 142* 86 164*    Signed:  Barton Dubois  [facility] 10/13/2014, 2:34 PM

## 2014-10-13 NOTE — Progress Notes (Signed)
Patient arrived on floor approx. 1600 via wheelchair accompanied by nursing staff. Patient verbalized understanding of rehab process and signed fall safety agreement. Patient in no immediate distress. Nonie Hoyer, RN

## 2014-10-13 NOTE — Progress Notes (Signed)
Medicare Important Message given? YES  (If response is "NO", the following Medicare IM given date fields will be blank)  Date Medicare IM given: 10/13/14 Medicare IM given by:  Dahlia Client [employer]

## 2014-10-13 NOTE — Progress Notes (Addendum)
ANTIBIOTIC CONSULT NOTE   Pharmacy Consult for Vancomycin/Cefepime  Indication: rule out pneumonia  Allergies  Allergen Reactions  . Niacin Itching    Niaspan     Patient Measurements: Height: 5\' 7"  (170.2 cm) Weight: 144 lb 8 oz (65.545 kg) IBW/kg (Calculated) : 66.1  Vital Signs: Temp: 98.2 F (36.8 C) (05/20 0537) Temp Source: Oral (05/20 0537) BP: 109/45 mmHg (05/20 1220) Pulse Rate: 57 (05/20 1220) Intake/Output from previous day: 05/19 0701 - 05/20 0700 In: 650 [P.O.:600; IV Piggyback:50] Out: 2735 [Urine:2735] Intake/Output from this shift: Total I/O In: 240 [P.O.:240] Out: -   Labs:  Recent Labs  10/11/14 0238 10/12/14 0305 10/13/14 0350  WBC 10.5 8.6 8.1  HGB 8.0* 8.2* 8.4*  PLT 137* 120* 139*  CREATININE 1.85* 1.78* 1.73*   Estimated Creatinine Clearance: 33.1 mL/min (by C-G formula based on Cr of 1.73). No results for input(s): VANCOTROUGH, VANCOPEAK, VANCORANDOM, GENTTROUGH, GENTPEAK, GENTRANDOM, TOBRATROUGH, TOBRAPEAK, TOBRARND, AMIKACINPEAK, AMIKACINTROU, AMIKACIN in the last 72 hours.   Microbiology: Recent Results (from the past 720 hour(s))  Urine culture     Status: None   Collection Time: 10/10/14 12:14 AM  Result Value Ref Range Status   Specimen Description URINE, CLEAN CATCH  Final   Special Requests NONE  Final   Colony Count   Final    20,OOO COLONIES/ML Performed at [facility]    Culture   Final    STAPHYLOCOCCUS SPECIES (COAGULASE NEGATIVE) Note: RIFAMPIN AND GENTAMICIN SHOULD NOT BE USED AS SINGLE DRUGS FOR TREATMENT OF STAPH INFECTIONS. Performed at [facility]    Report Status 10/13/2014 FINAL  Final   Organism ID, Bacteria STAPHYLOCOCCUS SPECIES (COAGULASE NEGATIVE)  Final      Susceptibility   Staphylococcus species (coagulase negative) - MIC*    GENTAMICIN <=0.5 SENSITIVE Sensitive     LEVOFLOXACIN >=8 RESISTANT Resistant     NITROFURANTOIN <=16 SENSITIVE Sensitive     OXACILLIN >=4 RESISTANT  Resistant     PENICILLIN >=0.5 RESISTANT Resistant     RIFAMPIN <=0.5 SENSITIVE Sensitive     TRIMETH/SULFA <=10 SENSITIVE Sensitive     VANCOMYCIN 1 SENSITIVE Sensitive     TETRACYCLINE <=1 SENSITIVE Sensitive     * STAPHYLOCOCCUS SPECIES (COAGULASE NEGATIVE)  MRSA PCR Screening     Status: None   Collection Time: 10/10/14  1:49 AM  Result Value Ref Range Status   MRSA by PCR NEGATIVE NEGATIVE Final    Comment:        The GeneXpert MRSA Assay (FDA approved for NASAL specimens only), is one component of a comprehensive MRSA colonization surveillance program. It is not intended to diagnose MRSA infection nor to guide or monitor treatment for MRSA infections.     Medical History: Past Medical History  Diagnosis Date  . VENTRICULAR TACHYCARDIA   . PERIPHERAL VASCULAR DISEASE   . HYPERTENSION     Hypertensive retinopathy-grade II OU  . HYPERCHOLESTEROLEMIA   . CORONARY ARTERY DISEASE     Hx of CABG  . Chronic systolic heart failure     Ischemic cardiomyopathy (01/2014 EF 40-45% with hypokinesis + small area of akinesis  . SEBORRHEIC DERMATITIS   . Chronic renal insufficiency, stage III (moderate)     CrCl 40s-50s  . DIVERTICULOSIS OF COLON   . COPD, severe   . COLONIC POLYPS   . ANXIETY   . ICD (implantable cardiac defibrillator) in place   . Type II diabetes mellitus     +background diabetic retinopathy OU  .  Chronic lower back pain   . DJD (degenerative joint disease)     "hands" (05/24/2012)  . Osteoarthritis of finger   . History of atrial flutter   . Thrombus 12/2013    on ICD lead--started anticoag and his cardioversion for atrial flutter was postponed.  . Colon stricture 2015    with features worrisome for mass, also with recent rising CEA level (possible colon cancer)-GI MD= Dr. Franki Cabot recent eval/follow up with Dr. Earlean Shawl was summer 2014, at which time he recommended colonoscopy but pt declined.  . Macular degeneration, age related, nonexudative     OU     Assessment: Here for increased weakness / fatigue /confusion/falls - has hit his head but denies losing consciousness CXR concerning for PNA, WBC WNL, Scr 2.3 with CrCl <30, lactic acid 2.37, other labs as above.     Rx Dialog  PMH: CAD status post CABG, chronic Afib on Apixaban, ICM, DM, CKD, COPD   AC: Apix PTA - no active bleeding, capsule endo 5/19, GI ok with restarting apix. Given issues with GIB, scr 1.78, wt ~66kg and age 79 - will restart at low dose 2.5 bid Hgb low but stable at 8.4, no bleeding noted.  ID: Cxr R opacity prob pna, wbc down to 8, afebrile, TRH mentioned change to po abx today 5/20 however no note for today to verify Vanc 5/17> Cefepime 5/17> 5/17 urine - coag negative staph  CV: HF ICD EF 40%, HR low 50-60s coreg 3.125, hydral/nitrate - no acei d/t inc Cr, BP soft stable 100/40, Hx VT amio  Endo: cbg < 200 lantus/ssi, - on at home, on home synth  GI/Nutr: LFT slight elev stable GIB plan capsule endo 5/19 - bm this am 5/20 - unlikely endo report will be read before next week  Renal: Cr 1.7 - at BL, K replaced, mag 1.8  Heme/Onc:hgb 8.4, plt 139 - low but stable  Other/PTA meds  Best practices:apix  Plan: note done 5/20 -Vancomycin 750 mg IV q24h-check trough tonight in case vanc is continued 7-10d -Cefepime 1g IV q24h -Trend WBC, temp, renal function  -Change to po abx ? 5/20       Goal of Therapy:  Vancomycin trough level 15-20 mcg/ml  Plan:  -Vancomycin 750 mg IV q24h-trough tonight if continued -Cefepime 1g IV q24h -Trend WBC, temp, renal function  - Continue apix 2.5mg  bid  Erin Hearing PharmD., BCPS Clinical Pharmacist Pager [phone] 10/13/2014 1:07 PM

## 2014-10-13 NOTE — Progress Notes (Signed)
Report given to Autumn RN

## 2014-10-13 NOTE — Progress Notes (Signed)
Rehab admissions - I am following pt's case and received medical clearance from Dr. Dyann Kief. We have an available rehab bed and will admit to inpatient rehab today.  I called and updated pt and his wife and will complete admission paperwork with them shortly.   I called and updated Steffanie [name], case Freight forwarder and Randall Hiss, Education officer, museum.  Please call me with any questions. Thanks.  Nanetta Batty, PT Rehabilitation Admissions Coordinator [phone]

## 2014-10-14 ENCOUNTER — Inpatient Hospital Stay (HOSPITAL_COMMUNITY): Payer: Medicare Other | Admitting: Physical Therapy

## 2014-10-14 ENCOUNTER — Inpatient Hospital Stay (HOSPITAL_COMMUNITY): Payer: Medicare Other | Admitting: Occupational Therapy

## 2014-10-14 ENCOUNTER — Inpatient Hospital Stay (HOSPITAL_COMMUNITY): Payer: Medicare Other

## 2014-10-14 ENCOUNTER — Inpatient Hospital Stay (HOSPITAL_COMMUNITY): Payer: Medicare Other | Admitting: *Deleted

## 2014-10-14 LAB — CBC
HCT: 27.8 % — ABNORMAL LOW (ref 39.0–52.0)
HCT: 25 % — ABNORMAL LOW (ref 39.0–52.0)
HEMOGLOBIN: 8.9 g/dL — AB (ref 13.0–17.0)
HEMOGLOBIN: 8.2 g/dL — AB (ref 13.0–17.0)
MCH: 29.4 pg (ref 26.0–34.0)
MCH: 30 pg (ref 26.0–34.0)
MCHC: 32 g/dL (ref 30.0–36.0)
MCHC: 32.8 g/dL (ref 30.0–36.0)
MCV: 91.7 fL (ref 78.0–100.0)
MCV: 91.6 fL (ref 78.0–100.0)
PLATELETS: 148 10*3/uL — AB (ref 150–400)
Platelets: 136 10*3/uL — ABNORMAL LOW (ref 150–400)
RBC: 3.03 MIL/uL — AB (ref 4.22–5.81)
RBC: 2.73 MIL/uL — ABNORMAL LOW (ref 4.22–5.81)
RDW: 17.3 % — ABNORMAL HIGH (ref 11.5–15.5)
RDW: 17.3 % — AB (ref 11.5–15.5)
WBC: 8.5 10*3/uL (ref 4.0–10.5)
WBC: 7.2 10*3/uL (ref 4.0–10.5)

## 2014-10-14 LAB — COMPREHENSIVE METABOLIC PANEL
ALBUMIN: 2 g/dL — AB (ref 3.5–5.0)
ALT: 18 U/L (ref 17–63)
AST: 52 U/L — ABNORMAL HIGH (ref 15–41)
Alkaline Phosphatase: 92 U/L (ref 38–126)
Anion gap: 9 (ref 5–15)
BUN: 31 mg/dL — ABNORMAL HIGH (ref 6–20)
CHLORIDE: 87 mmol/L — AB (ref 101–111)
CO2: 34 mmol/L — AB (ref 22–32)
Calcium: 8.2 mg/dL — ABNORMAL LOW (ref 8.9–10.3)
Creatinine, Ser: 1.89 mg/dL — ABNORMAL HIGH (ref 0.61–1.24)
GFR calc non Af Amer: 33 mL/min — ABNORMAL LOW (ref >60)
GFR, EST AFRICAN AMERICAN: 38 mL/min — AB (ref >60)
Glucose, Bld: 118 mg/dL — ABNORMAL HIGH (ref 65–99)
Potassium: 3.4 mmol/L — ABNORMAL LOW (ref 3.5–5.1)
SODIUM: 130 mmol/L — AB (ref 135–145)
TOTAL PROTEIN: 5.7 g/dL — AB (ref 6.5–8.1)
Total Bilirubin: 1.3 mg/dL — ABNORMAL HIGH (ref 0.3–1.2)

## 2014-10-14 LAB — GLUCOSE, CAPILLARY
Glucose-Capillary: 140 mg/dL — ABNORMAL HIGH (ref 65–99)
Glucose-Capillary: 110 mg/dL — ABNORMAL HIGH (ref 65–99)
Glucose-Capillary: 143 mg/dL — ABNORMAL HIGH (ref 65–99)
Glucose-Capillary: 104 mg/dL — ABNORMAL HIGH (ref 65–99)
Glucose-Capillary: 133 mg/dL — ABNORMAL HIGH (ref 65–99)

## 2014-10-14 LAB — CBC WITH DIFFERENTIAL/PLATELET
Basophils Absolute: 0 10*3/uL (ref 0.0–0.1)
Basophils Relative: 0 % (ref 0–1)
Eosinophils Absolute: 0.2 10*3/uL (ref 0.0–0.7)
Eosinophils Relative: 3 % (ref 0–5)
HCT: 26.6 % — ABNORMAL LOW (ref 39.0–52.0)
HEMOGLOBIN: 9 g/dL — AB (ref 13.0–17.0)
LYMPHS ABS: 1.5 10*3/uL (ref 0.7–4.0)
LYMPHS PCT: 18 % (ref 12–46)
MCH: 31.1 pg (ref 26.0–34.0)
MCHC: 33.8 g/dL (ref 30.0–36.0)
MCV: 92 fL (ref 78.0–100.0)
MONO ABS: 1.1 10*3/uL — AB (ref 0.1–1.0)
Monocytes Relative: 13 % — ABNORMAL HIGH (ref 3–12)
NEUTROS ABS: 5.5 10*3/uL (ref 1.7–7.7)
Neutrophils Relative %: 66 % (ref 43–77)
Platelets: 145 10*3/uL — ABNORMAL LOW (ref 150–400)
RBC: 2.89 MIL/uL — ABNORMAL LOW (ref 4.22–5.81)
RDW: 17.3 % — ABNORMAL HIGH (ref 11.5–15.5)
WBC: 8.4 10*3/uL (ref 4.0–10.5)

## 2014-10-14 MED ORDER — ACETAMINOPHEN 325 MG PO TABS
650.0000 mg | ORAL_TABLET | ORAL | Status: DC | PRN
  Administered 2014-10-14 – 2014-10-17 (×2): 650 mg via ORAL
  Filled 2014-10-14 (×2): qty 2

## 2014-10-14 MED ORDER — POTASSIUM CHLORIDE CRYS ER 20 MEQ PO TBCR
20.0000 meq | EXTENDED_RELEASE_TABLET | Freq: Two times a day (BID) | ORAL | Status: DC
  Administered 2014-10-14 – 2014-10-18 (×8): 20 meq via ORAL
  Filled 2014-10-14 (×8): qty 1

## 2014-10-14 MED ORDER — POTASSIUM CHLORIDE CRYS ER 20 MEQ PO TBCR
40.0000 meq | EXTENDED_RELEASE_TABLET | Freq: Once | ORAL | Status: AC
  Administered 2014-10-14: 40 meq via ORAL
  Filled 2014-10-14: qty 2

## 2014-10-14 NOTE — Progress Notes (Signed)
Occupational Therapy Assessment and Plan  Patient Details  Name: [name] MRN: [id number] Date of Birth: 1936-01-24  OT Diagnosis: muscle weakness (generalized) Rehab Potential: Rehab Potential (ACUTE ONLY): Good (for stated goals) ELOS: 5- 7 days   Today's Date: 10/14/2014 OT Individual Time: 0900-1000 and 3893-7342 OT Individual Time Calculation (min): 60 min   And 24 min  Problem List:  Patient Active Problem List   Diagnosis Date Noted  . Physical deconditioning 10/13/2014  . Chronic atrial fibrillation   . Hospital acquired PNA   . Weakness   . Chronic respiratory failure with hypoxia   . Anemia 10/10/2014  . GI bleed 10/10/2014  . Diabetes mellitus type 2, controlled 10/10/2014  . Heme positive stool   . Anemia due to chronic blood loss   . Chronic anticoagulation   . OSA (obstructive sleep apnea)   . CAD in native artery   . Pulmonary hypertension   . Severe mitral regurgitation   . COPD exacerbation   . Obstructive sleep apnea   . Transaminitis   . Cardiomyopathy, ischemic   . Chronic systolic congestive heart failure   . Paroxysmal atrial fibrillation   . Acute on chronic renal failure   . Diabetes type 2, controlled   . Other specified hypothyroidism   . Esophageal candidiasis   . Normocytic anemia   . HCAP (healthcare-associated pneumonia)   . FUO (fever of unknown origin)   . Fever 09/09/2014  . Fatigue 09/09/2014  . Candida esophagitis 09/09/2014  . Chronic hyponatremia 09/09/2014  . Metabolic encephalopathy 87/68/1157  . Iron deficiency anemia due to chronic blood loss   . CKD (chronic kidney disease) stage 3, GFR 30-59 ml/min   . Mitral regurgitation 05/09/2014  . Elevated CEA 04/03/2014  . Type 2 diabetes mellitus, uncontrolled 03/13/2014  . Insomnia 03/13/2014  . Anorexia 02/12/2014  . Low back pain 02/12/2014  . Chronic systolic CHF (congestive heart failure), NYHA class 4 01/24/2014  . COPD, severe 01/02/2014  . Atrial flutter  12/12/2013  . Pleural plaque 08/31/2013  . Colonic stricture 12/22/2012  . Automatic implantable cardioverter-defibrillator in situ 04/17/2010  . Carotid artery disease 04/16/2010  . SEBORRHEIC DERMATITIS 05/30/2009  . ANXIETY 08/02/2007  . Coronary atherosclerosis 08/02/2007  . VENTRICULAR TACHYCARDIA 08/02/2007  . RENAL INSUFFICIENCY 08/02/2007  . DEGENERATIVE JOINT DISEASE 08/02/2007  . COLONIC POLYPS 03/24/2007  . HYPERCHOLESTEROLEMIA 03/24/2007  . Essential hypertension 03/24/2007  . PERIPHERAL VASCULAR DISEASE 03/24/2007    Past Medical History:  Past Medical History  Diagnosis Date  . VENTRICULAR TACHYCARDIA   . PERIPHERAL VASCULAR DISEASE   . HYPERTENSION     Hypertensive retinopathy-grade II OU  . HYPERCHOLESTEROLEMIA   . CORONARY ARTERY DISEASE     Hx of CABG  . Chronic systolic heart failure     Ischemic cardiomyopathy (01/2014 EF 40-45% with hypokinesis + small area of akinesis  . SEBORRHEIC DERMATITIS   . Chronic renal insufficiency, stage III (moderate)     CrCl 40s-50s  . DIVERTICULOSIS OF COLON   . COPD, severe   . COLONIC POLYPS   . ANXIETY   . ICD (implantable cardiac defibrillator) in place   . Type II diabetes mellitus     +background diabetic retinopathy OU  . Chronic lower back pain   . DJD (degenerative joint disease)     "hands" (05/24/2012)  . Osteoarthritis of finger   . History of atrial flutter   . Thrombus 12/2013    on ICD lead--started anticoag and his  cardioversion for atrial flutter was postponed.  . Colon stricture 2015    with features worrisome for mass, also with recent rising CEA level (possible colon cancer)-GI MD= Dr. Franki Cabot recent eval/follow up with Dr. Earlean Shawl was summer 2014, at which time he recommended colonoscopy but pt declined.  . Macular degeneration, age related, nonexudative     OU   Past Surgical History:  Past Surgical History  Procedure Laterality Date  . Coronary artery bypass graft  1990's    CABG X4   . Tonsillectomy  ?1942  . Cardiac defibrillator placement  12/2004    single chamber defibrillator- Medtronic Maxima 8/06 DrKlein [Other][  . Tee without cardioversion N/A 12/22/2013    Procedure: TRANSESOPHAGEAL ECHOCARDIOGRAM (TEE);  Surgeon: Thayer Headings, MD;  Location: [facility];  Service: Cardiovascular;  Laterality: N/A;  . Cardioversion N/A 12/22/2013    Procedure: CARDIOVERSION;  Surgeon: Thayer Headings, MD;  Location: [facility] ENDOSCOPY;  Service: Cardiovascular;  Laterality: N/A;  . Cardiovascular stress test  01/12/13    myocard perf imaging: previous infarct with a moderately reduced EF as was known previously.  No new findings.   . Gastric emptying scan  02/02/14    Normal  . Transthoracic echocardiogram  01/25/14; 04/2014    EF 40-45%, diffuse hypokinesis, infero-basilar myocardial akinesis, PA pressure slightly high, valves fine.04/2014-Echo today EF ~40% inferior AK moderate to severe MR  . Abdominal ultrasound  01/2014    GB sludge, o/w normal  . Ct virtual colonoscopy diagnostic  11/2012    Asymmetric thickening of the rectosigmoid colon worrisome for  . Cardiovascular stress test      Lexiscan: There is a medium sized fixed inferior wall defect of moderate severity involving the apical inferior and mid inferior and basal inferoseptal segments, consistent with old inferior MI. No significant reversible ischemia.  EF 34%, inferior wall hypokinesis--intermediate risk scan (ok to do planned procedure)  . Implantable cardioverter defibrillator revision N/A 05/25/2012    Procedure: IMPLANTABLE CARDIOVERTER DEFIBRILLATOR REVISION;  Surgeon: Evans Lance, MD;  Location: [facility] CATH LAB;  Service: Cardiovascular;  Laterality: N/A;  . Right heart catheterization N/A 08/31/2014    Procedure: RIGHT HEART CATH;  Surgeon: Larey Dresser, MD;  Location: [facility] CATH LAB;  Service: Cardiovascular;  Laterality: N/A;  . Esophagogastroduodenoscopy N/A 09/03/2014    Procedure: ESOPHAGOGASTRODUODENOSCOPY  (EGD);  Surgeon: Carol Ada, MD;  Location: [facility] ENDOSCOPY;  Service: Endoscopy;  Laterality: N/A;  . Givens capsule study N/A 10/12/2014    Procedure: GIVENS CAPSULE STUDY;  Surgeon: Irene Shipper, MD;  Location: [facility];  Service: Endoscopy;  Laterality: N/A;    Assessment & Plan Clinical Impression: Patient is a 79 y.o. year old male with history of CAD, VT/ICD, DM type 2, COPD-oxygen HS, chronic systolic CHF-milrinone, recent admission 08/2014 for GIB, candida esophagitis and PNA who was readmitted 10/09/14 with worsening of weakness, increasing DOE and falls. CT head without acute changes. Patient was noted to be anemic with Hgb 7.2, positive FOBT and CXR showed evidence of volume overload and PNA. He was transfused with 1 units PRBC and started on IV anbiotics for PNA. Dr. Haroldine Laws consulted for input on chronic AC held and diuretics resumed to help with fluid overload. Dr. Henrene Pastor consulted for input on colonoscopy and expressed concerns as prior attempts at colonoscopy unsuccessful due to sigmoid stenosis. He recommended capsule endoscopy for work up with transfusion as indicated and resume AC if H/H stable. Swallow evaluation done and negative for signs of dysphagia.  Capsule endoscopy done yesterday and eliquis resumed as no signs of active bleeding noted. Therapy ongoing and CIR recommended due to deconditioned state. Patient transferred to CIR on 10/13/2014 .    Patient currently requires min with basic self-care skills secondary to muscle weakness, decreased cardiorespiratoy endurance and decreased standing balance and decreased balance strategies.  Prior to hospitalization, patient could complete ADLs with modified independent .  Patient will benefit from skilled intervention to increase independence with basic self-care skills prior to discharge home with care partner.  Anticipate patient will require intermittent supervision and follow up outpatient.  OT - End of Session Activity  Tolerance: Decreased this session Endurance Deficit: Yes Endurance Deficit Description: Fatigues quickly, requires frequent rest breaks; desaturation an issue as well with activity OT Assessment Rehab Potential (ACUTE ONLY): Good (for stated goals) Barriers to Discharge:  (none noted at this time) OT Patient demonstrates impairments in the following area(s): Balance;Endurance;Motor;Safety OT Basic ADL's Functional Problem(s): Grooming;Bathing;Dressing;Toileting OT Advanced ADL's Functional Problem(s):  (n/a) OT Transfers Functional Problem(s): Toilet;Tub/Shower OT Additional Impairment(s): None OT Plan OT Intensity: Minimum of 1-2 x/day, 45 to 90 minutes OT Frequency: 5 out of 7 days OT Duration/Estimated Length of Stay: 5- 7 days OT Treatment/Interventions: Balance/vestibular training;Discharge planning;Self Care/advanced ADL retraining;Therapeutic Activities;UE/LE Coordination activities;Cognitive remediation/compensation;Functional mobility training;Patient/family education;Therapeutic Exercise;Community reintegration;DME/adaptive equipment instruction;Neuromuscular re-education;Psychosocial support;UE/LE Strength taining/ROM;Wheelchair propulsion/positioning OT Self Feeding Anticipated Outcome(s): n/a OT Basic Self-Care Anticipated Outcome(s): Mod I  OT Toileting Anticipated Outcome(s): Mod I  OT Bathroom Transfers Anticipated Outcome(s): Mod I  OT Recommendation Recommendations for Other Services: Other (comment) (none) Patient destination: Home Follow Up Recommendations: Outpatient OT Equipment Recommended: To be determined   Skilled Therapeutic Intervention Session 1: Upon entering the room, pt supine in bed with no c/o pain this session. OT educated pt on OT purpose, POC, and goals with pt verbalizing understanding. Pt on 2 L O2 via [place] and RN reporting it is only worn at night. O2 removed. Pt performed supine >sit with supervision. Ambulation with RW into bathroom for min A  transfer into walk in shower. Pt seated on shower chair for bathing tasks and sitting on recliner chair for dressing. Pt O2 stats dropping to 82% but quickly increasing with O2 donned for less than 30 seconds before being removed again. O2 checked several times during session and it remained 94-96% during session. Pt required steady assist for LB clothing management. He performed grooming tasks while standing at sink and request to remain seated in recliner chair secondary to fatigue. Call bell and all needed items within reach upon exiting the room.   Session 2: Upon entering the room, pt supine in bed with wife and RN present in the room. Pt with 8/10 c/o chronic lower back pain. Pt required coaxing to participate in OT intervention. Pt ambulated ~ 200' with 1 paused standing break and 1 seated rest break secondary to fatigue. Pt reporting pain decreasing to 6/10 with ambulation. OT educated pt on energy conservation strategies during self care tasks as well as in the community. Education to continue. Pt ambulated back to room in same manner as above. Pt removed B shoes and performed sit >supine with supervision. Heating pad placed under back for pain and call bell and all needed items within reach upon exiting the room. Wife remained present.   OT Evaluation Precautions/Restrictions  Precautions Precautions: Fall Precaution Comments: Multiple falls pta. Restrictions Weight Bearing Restrictions: No Vital Signs Therapy Vitals Pulse Rate: (!) 55 BP: (!) 97/35 mmHg Patient Position (if  appropriate): Lying Oxygen Therapy SpO2: 98 % O2 Device: Nasal Cannula O2 Flow Rate (L/min): 2 L/min Pain Pain Assessment Pain Assessment: No/denies pain Pain Score: 0-No pain Home Living/Prior Functioning Home Living Family/patient expects to be discharged to:: Private residence Living Arrangements: Spouse/significant other Available Help at Discharge: Family, Available 24 hours/day Type of Home:  House Home Access: Stairs to enter [employer] of Steps: 3 from the garage; mroe steps at front entrance but patient reports he only uses garage Entrance Stairs-Rails: Can reach both Home Layout: One level  Lives With: Spouse Prior Function Level of Independence: Requires assistive device for independence  Able to Take Stairs?: Yes Driving: No Vocation: Retired Public house manager Requirements: [employer] Leisure: Hobbies-yes (Comment) Comments: golf, grandson Vision/Perception  Vision- History Baseline Vision/History: Wears glasses Wears Glasses: At all times Patient Visual Report: No change from baseline Vision- Assessment Vision Assessment?: No apparent visual deficits  Cognition Overall Cognitive Status: Within Functional Limits for tasks assessed Arousal/Alertness: Awake/alert Orientation Level: Oriented X4 Attention: Selective Selective Attention: Appears intact Memory: Appears intact Awareness: Appears intact Problem Solving: Appears intact Safety/Judgment: Appears intact Sensation Sensation Light Touch: Appears Intact Stereognosis: Not tested Hot/Cold: Appears Intact Proprioception: Appears Intact Coordination Gross Motor Movements are Fluid and Coordinated: Yes Fine Motor Movements are Fluid and Coordinated: Yes Motor  Motor Motor: Within Functional Limits Mobility  Bed Mobility Bed Mobility: Supine to Sit;Sit to Supine Supine to Sit: 5: Supervision;HOB flat Sit to Supine: 5: Supervision;HOB flat Transfers Sit to Stand: 5: Supervision;With armrests;From bed;From chair/3-in-1;With upper extremity assist Sit to Stand Details: Verbal cues for technique Stand to Sit: 5: Supervision;To chair/3-in-1;To bed;With upper extremity assist;With armrests Stand to Sit Details (indicate cue type and reason): Verbal cues for technique  Trunk/Postural Assessment  Cervical Assessment Cervical Assessment: Exceptions to [facility] (slight forward head  posture) Thoracic Assessment Thoracic Assessment: Within Functional Limits Lumbar Assessment Lumbar Assessment: Within Functional Limits Postural Control Postural Control: Within Functional Limits  Balance Static Sitting Balance Static Sitting - Balance Support: Bilateral upper extremity supported;No upper extremity supported;Feet supported Static Sitting - Level of Assistance: 6: Modified independent (Device/Increase time) Dynamic Sitting Balance Dynamic Sitting - Balance Support: Bilateral upper extremity supported;No upper extremity supported;Feet supported Dynamic Sitting - Level of Assistance: 6: Modified independent (Device/Increase time) Static Standing Balance Static Standing - Balance Support: Bilateral upper extremity supported;During functional activity Static Standing - Level of Assistance: 5: Stand by assistance Extremity/Trunk Assessment RUE Assessment RUE Assessment: Exceptions to [facility] (3+/5 throughout) LUE Assessment LUE Assessment: Exceptions to [facility] (3+/5 throughout)  FIM:  FIM - Eating Eating Activity: 7: Complete independence:no helper FIM - Grooming Grooming Steps: Wash, rinse, dry face;Wash, rinse, dry hands;Oral care, brush teeth, clean dentures;Brush, comb hair;Shave or apply make-up Grooming: 4: Steadying assist  or patient completes 3 of 4 or 4 of 5 steps FIM - Bathing Bathing Steps Patient Completed: Chest;Right Arm;Left Arm;Abdomen;Front perineal area;Right upper leg;Left upper leg;Right lower leg (including foot);Left lower leg (including foot) Bathing: 4: Min-Patient completes 8-9 2f10 parts or 75+ percent FIM - Upper Body Dressing/Undressing Upper body dressing/undressing steps patient completed: Thread/unthread right sleeve of pullover shirt/dresss;Thread/unthread left sleeve of pullover shirt/dress;Put head through opening of pull over shirt/dress;Pull shirt over trunk Upper body dressing/undressing: 5: Set-up assist to: Obtain clothing/put away FIM  - Lower Body Dressing/Undressing Lower body dressing/undressing steps patient completed: Thread/unthread right underwear leg;Thread/unthread left underwear leg;Pull underwear up/down;Thread/unthread right pants leg;Thread/unthread left pants leg;Pull pants up/down;Don/Doff right shoe;Don/Doff left shoe;Fasten/unfasten right shoe;Fasten/unfasten left shoe  Lower body dressing/undressing: 4: Steadying Assist FIM - Systems developer Devices: Shower chair;Walk in Magazine features editor Transfers: 4-Into Tub/Shower: Min A (steadying Pt. > 75%/lift 1 leg);4-Out of Tub/Shower: Min A (steadying Pt. > 75%/lift 1 leg)   Refer to Care Plan for Long Term Goals  Recommendations for other services: None  Discharge Criteria: Patient will be discharged from OT if patient refuses treatment 3 consecutive times without medical reason, if treatment goals not met, if there is a change in medical status, if patient makes no progress towards goals or if patient is discharged from hospital.  The above assessment, treatment plan, treatment alternatives and goals were discussed and mutually agreed upon: by patient  Phineas Semen 10/14/2014, 10:17 AM

## 2014-10-14 NOTE — Progress Notes (Signed)
   Chart reviewed.   Hgb stable. Weight continues to drop. Renal function slightly worse. Will cut torsemide to 20 daily. Supp K+  Bensimhon, Daniel,MD 8:00 AM

## 2014-10-14 NOTE — Progress Notes (Signed)
[name] is a 79 y.o. male 04/19/1936 [id number]  Subjective: No new complaints. No new problems. Slept well. Feeling OK.  Objective: Vital signs in last 24 hours: Temp:  [97.5 F (36.4 C)-98.1 F (36.7 C)] 98.1 F (36.7 C) (05/21 0550) Pulse Rate:  [55-65] 58 (05/21 1024) Resp:  [15-17] 16 (05/21 0550) BP: (93-112)/(33-46) 96/46 mmHg (05/21 1024) SpO2:  [94 %-100 %] 96 % (05/21 1024) Weight:  [142 lb 3.2 oz (64.5 kg)-145 lb 4.5 oz (65.9 kg)] 142 lb 3.2 oz (64.5 kg) (05/21 0550) Weight change:  Last BM Date: 10/13/14  Intake/Output from previous day: 05/20 0701 - 05/21 0700 In: 240 [P.O.:240] Out: 1000 [Urine:1000]  Physical Exam General: No apparent distress Supine in bed with head elevated   Lungs: Normal effort. Lungs clear to auscultation anterior, no crackles or wheezes. Cardiovascular: irregular rate and rhythm, no edema Musculoskeletal:  Neurovascularly intact Wounds: N/A      Lab Results: BMET    Component Value Date/Time   NA 130* 10/14/2014 0527   K 3.4* 10/14/2014 0527   CL 87* 10/14/2014 0527   CO2 34* 10/14/2014 0527   GLUCOSE 118* 10/14/2014 0527   GLUCOSE 117* 03/16/2006 0913   BUN 31* 10/14/2014 0527   CREATININE 1.89* 10/14/2014 0527   CALCIUM 8.2* 10/14/2014 0527   GFRNONAA 33* 10/14/2014 0527   GFRAA 38* 10/14/2014 0527   CBC    Component Value Date/Time   WBC 8.5 10/14/2014 0756   RBC 3.03* 10/14/2014 0756   RBC 3.24* 09/11/2014 0216   HGB 8.9* 10/14/2014 0756   HCT 27.8* 10/14/2014 0756   PLT 148* 10/14/2014 0756   MCV 91.7 10/14/2014 0756   MCH 29.4 10/14/2014 0756   MCHC 32.0 10/14/2014 0756   RDW 17.3* 10/14/2014 0756   LYMPHSABS 1.5 10/14/2014 0527   MONOABS 1.1* 10/14/2014 0527   EOSABS 0.2 10/14/2014 0527   BASOSABS 0.0 10/14/2014 0527   CBG's (last 3):   Recent Labs  10/13/14 1649 10/13/14 2045 10/14/14 0638  GLUCAP 152* 116* 110*   LFT's Lab Results  Component Value Date   ALT 18 10/14/2014   AST 52*  10/14/2014   ALKPHOS 92 10/14/2014   BILITOT 1.3* 10/14/2014    Studies/Results: Dg Chest 2 View  10/14/2014   CLINICAL DATA:  Hospital acquired pneumonia.  Ex-smoker.  EXAM: CHEST  2 VIEW  COMPARISON:  10/09/2014.  FINDINGS: Stable enlarged cardiac silhouette, post CABG changes and left subclavian and AICD leads. Stable prominence of the interstitial markings and calcified pleural plaques. Small amount of bilateral pleural thickening or fluid. Interval mild patchy opacity in the right mid lung zone. The hemidiaphragms remain flattened.  IMPRESSION: 1. Interval mild patchy opacity suspicious for pneumonia in the right mid lung zone. 2. Stable changes of COPD with interstitial fibrosis. 3. 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   Electronically Signed   By: Claudie Revering M.D.   On: 10/14/2014 10:29    Medications:  I have reviewed the patient's current medications. Scheduled Medications: . amiodarone  200 mg Oral Daily  . amoxicillin-clavulanate  1 tablet Oral TID  . apixaban  2.5 mg Oral BID  . budesonide (PULMICORT) nebulizer solution  0.25 mg Nebulization BID  . carvedilol  3.125 mg Oral BID WC  . folic acid  1 mg Oral Daily  . guaiFENesin  600 mg Oral BID  . hydrALAZINE  25 mg Oral TID  . insulin aspart  0-9 Units Subcutaneous TID WC  .  insulin glargine  10 Units Subcutaneous QHS  . isosorbide mononitrate  30 mg Oral Daily  . levothyroxine  75 mcg Oral QAC breakfast  . pantoprazole  40 mg Oral Q0600  . potassium chloride  20 mEq Oral BID  . pravastatin  80 mg Oral q1800  . saccharomyces boulardii  250 mg Oral BID  . thiamine  100 mg Oral Daily  . tiotropium  18 mcg Inhalation Daily  . torsemide  40 mg Oral Daily   PRN Medications: alum & mag hydroxide-simeth, bisacodyl, guaiFENesin-dextromethorphan, ondansetron **OR** ondansetron (ZOFRAN) IV, ondansetron **OR** ondansetron (ZOFRAN) IV, senna-docusate, traZODone  Assessment/Plan: Principal  Problem:   Physical deconditioning Active Problems:   Type 2 diabetes mellitus, uncontrolled   HCAP (healthcare-associated pneumonia)   Hospital acquired PNA  1. Functional deficits secondary to debility due to PNA/CHF and multiple medical 2. DVT Prophylaxis/Anticoagulation: Pharmaceutical: Other (comment)-Eliquis resumed yesterday 3. Pain Management: N/A 4. Mood: LCSW to follow for evaluation and support.  5. Neuropsych: This patient is capable of making decisions on his own behalf. 6. Skin/Wound Care: routine pressure relief measures.  7. Fluids/Electrolytes/Nutrition: Monitor I/o. Continue nutritional supplements.  8. Chronic systolic CHF: Monitor for signs of overload. Check daily weights. On coreg, Imdur and demadex. Held hydralazine today (5/21) 9. Recurrent GIB: serial H/H. Will monitor for signs of recurrent bleeding on chronic anticoag.  10. Severe COPD: Respiratory status stable on budesonide and spiriva. Continue oxygen at nights.  11. CKD: Monitor with routine checks. Avoid nephrotoxic medications. No ACE/ARB due to CKD.  12. PAF: Monitor heart rate tid. Continue amiodarone and eliquis anticoag 13. DM type 2-controlled: Monitor BS with ac/hs checks. Continue lantus 10 untis at bedtime with SSI for tighter BS control.  14. HTN; Monitor every 8 hours. hold Hydralazine today due to borderline hypotension; continue imdur, demadex and coreg for CHF.  15. ABLA: H/H slowly improving. 16. Thrombocytopenia: Continue to monitor platelets as well as for signs of bleeding.  17. Persistent Hypokalemia: Will add daily supplement 18. HCAP: Initially RLL changes s/w aspiration. Continues to have thick mucopurulent sputum production with cough--continue mucinex. Encourage IS. s/p 4 d IV vancomycin/Zosyn --transitioned to augmentin 5/20 for 5 additional days. follow up CXR 5/21 AM with patchy RML changes. O2 stable. Patient advised to be upright for meals. Continue observation 19.  Hypothyroid - continue synthroid replacement 20. Dyslipidemia - continue prava  Length of stay, days: 1   Joshawa Dubin A. Asa Lente, MD 10/14/2014, 10:32 AM

## 2014-10-14 NOTE — Evaluation (Signed)
Physical Therapy Assessment and Plan  Patient Details  Name: [name] MRN: [id number] Date of Birth: 02-21-1936  PT Diagnosis: Difficulty walking and Muscle weakness Rehab Potential: Good ELOS: 5-7 days   Today's Date: 10/14/2014 PT Individual Time: 1000-1100 PT Individual Time Calculation (min): 60 min    Problem List:  Patient Active Problem List   Diagnosis Date Noted  . Physical deconditioning 10/13/2014  . Chronic atrial fibrillation   . Hospital acquired PNA   . Weakness   . Chronic respiratory failure with hypoxia   . Anemia 10/10/2014  . GI bleed 10/10/2014  . Diabetes mellitus type 2, controlled 10/10/2014  . Heme positive stool   . Anemia due to chronic blood loss   . Chronic anticoagulation   . OSA (obstructive sleep apnea)   . CAD in native artery   . Pulmonary hypertension   . Severe mitral regurgitation   . COPD exacerbation   . Obstructive sleep apnea   . Transaminitis   . Cardiomyopathy, ischemic   . Chronic systolic congestive heart failure   . Paroxysmal atrial fibrillation   . Acute on chronic renal failure   . Diabetes type 2, controlled   . Other specified hypothyroidism   . Esophageal candidiasis   . Normocytic anemia   . HCAP (healthcare-associated pneumonia)   . FUO (fever of unknown origin)   . Fever 09/09/2014  . Fatigue 09/09/2014  . Candida esophagitis 09/09/2014  . Chronic hyponatremia 09/09/2014  . Metabolic encephalopathy 85/88/5027  . Iron deficiency anemia due to chronic blood loss   . CKD (chronic kidney disease) stage 3, GFR 30-59 ml/min   . Mitral regurgitation 05/09/2014  . Elevated CEA 04/03/2014  . Type 2 diabetes mellitus, uncontrolled 03/13/2014  . Insomnia 03/13/2014  . Anorexia 02/12/2014  . Low back pain 02/12/2014  . Chronic systolic CHF (congestive heart failure), NYHA class 4 01/24/2014  . COPD, severe 01/02/2014  . Atrial flutter 12/12/2013  . Pleural plaque 08/31/2013  . Colonic stricture 12/22/2012   . Automatic implantable cardioverter-defibrillator in situ 04/17/2010  . Carotid artery disease 04/16/2010  . SEBORRHEIC DERMATITIS 05/30/2009  . ANXIETY 08/02/2007  . Coronary atherosclerosis 08/02/2007  . VENTRICULAR TACHYCARDIA 08/02/2007  . RENAL INSUFFICIENCY 08/02/2007  . DEGENERATIVE JOINT DISEASE 08/02/2007  . COLONIC POLYPS 03/24/2007  . HYPERCHOLESTEROLEMIA 03/24/2007  . Essential hypertension 03/24/2007  . PERIPHERAL VASCULAR DISEASE 03/24/2007    Past Medical History:  Past Medical History  Diagnosis Date  . VENTRICULAR TACHYCARDIA   . PERIPHERAL VASCULAR DISEASE   . HYPERTENSION     Hypertensive retinopathy-grade II OU  . HYPERCHOLESTEROLEMIA   . CORONARY ARTERY DISEASE     Hx of CABG  . Chronic systolic heart failure     Ischemic cardiomyopathy (01/2014 EF 40-45% with hypokinesis + small area of akinesis  . SEBORRHEIC DERMATITIS   . Chronic renal insufficiency, stage III (moderate)     CrCl 40s-50s  . DIVERTICULOSIS OF COLON   . COPD, severe   . COLONIC POLYPS   . ANXIETY   . ICD (implantable cardiac defibrillator) in place   . Type II diabetes mellitus     +background diabetic retinopathy OU  . Chronic lower back pain   . DJD (degenerative joint disease)     "hands" (05/24/2012)  . Osteoarthritis of finger   . History of atrial flutter   . Thrombus 12/2013    on ICD lead--started anticoag and his cardioversion for atrial flutter was postponed.  . Colon stricture 2015  with features worrisome for mass, also with recent rising CEA level (possible colon cancer)-GI MD= Dr. Franki Cabot recent eval/follow up with Dr. Earlean Shawl was summer 2014, at which time he recommended colonoscopy but pt declined.  . Macular degeneration, age related, nonexudative     OU   Past Surgical History:  Past Surgical History  Procedure Laterality Date  . Coronary artery bypass graft  1990's    CABG X4  . Tonsillectomy  ?1942  . Cardiac defibrillator placement  12/2004     single chamber defibrillator- Medtronic Maxima 8/06 DrKlein [Other][  . Tee without cardioversion N/A 12/22/2013    Procedure: TRANSESOPHAGEAL ECHOCARDIOGRAM (TEE);  Surgeon: Thayer Headings, MD;  Location: [facility];  Service: Cardiovascular;  Laterality: N/A;  . Cardioversion N/A 12/22/2013    Procedure: CARDIOVERSION;  Surgeon: Thayer Headings, MD;  Location[facility] ENDOSCOPY;  Service: Cardiovascular;  Laterality: N/A;  . Cardiovascular stress test  01/12/13    myocard perf imaging: previous infarct with a moderately reduced EF as was known previously.  No new findings.   . Gastric emptying scan  02/02/14    Normal  . Transthoracic echocardiogram  01/25/14; 04/2014    EF 40-45%, diffuse hypokinesis, infero-basilar myocardial akinesis, PA pressure slightly high, valves fine.04/2014-Echo today EF ~40% inferior AK moderate to severe MR  . Abdominal ultrasound  01/2014    GB sludge, o/w normal  . Ct virtual colonoscopy diagnostic  11/2012    Asymmetric thickening of the rectosigmoid colon worrisome for  . Cardiovascular stress test      Lexiscan: There is a medium sized fixed inferior wall defect of moderate severity involving the apical inferior and mid inferior and basal inferoseptal segments, consistent with old inferior MI. No significant reversible ischemia.  EF 34%, inferior wall hypokinesis--intermediate risk scan (ok to do planned procedure)  . Implantable cardioverter defibrillator revision N/A 05/25/2012    Procedure: IMPLANTABLE CARDIOVERTER DEFIBRILLATOR REVISION;  Surgeon: Evans Lance, MD;  Location: [facility] CATH LAB;  Service: Cardiovascular;  Laterality: N/A;  . Right heart catheterization N/A 08/31/2014    Procedure: RIGHT HEART CATH;  Surgeon: Larey Dresser, MD;  Location: [facility] CATH LAB;  Service: Cardiovascular;  Laterality: N/A;  . Esophagogastroduodenoscopy N/A 09/03/2014    Procedure: ESOPHAGOGASTRODUODENOSCOPY (EGD);  Surgeon: Carol Ada, MD;  Location: [facility] ENDOSCOPY;  Service:  Endoscopy;  Laterality: N/A;  . Givens capsule study N/A 10/12/2014    Procedure: GIVENS CAPSULE STUDY;  Surgeon: Irene Shipper, MD;  Location: [place];  Service: Endoscopy;  Laterality: N/A;    Assessment & Plan Clinical Impression: [name] is a 79 y.o. male with history of CAD, VT/ICD, DM type 2, COPD-oxygen HS, chronic systolic CHF-milrinone, recent admission 08/2014 for GIB, candida esophagitis and PNA who was readmitted 10/09/14 with worsening of weakness, increasing DOE and falls. CT head without acute changes. Patient was noted to be anemic with Hgb 7.2, positive FOBT and CXR showed evidence of volume overload and PNA. He was transfused with 1 units PRBC and started on IV anbiotics for PNA. Dr. Haroldine Laws consulted for input on chronic AC held and diuretics resumed to help with fluid overload. Dr. Henrene Pastor consulted for input on colonoscopy and expressed concerns as prior attempts at colonoscopy unsuccessful due to sigmoid stenosis. He recommended capsule endoscopy for work up with transfusion as indicated and resume AC if H/H stable. Swallow evaluation done and negative for signs of dysphagia. Capsule endoscopy done yesterday and eliquis resumed as no signs of active bleeding noted.  Therapy ongoing and CIR recommended due to deconditioned state. Patient transferred to CIR on 10/13/2014 .   Patient currently requires supervision with mobility secondary to muscle weakness, decreased cardiorespiratoy endurance and decreased standing balance and decreased balance strategies.  Prior to hospitalization, patient was modified independent  with mobility and lived with Spouse in a House home.  Home access is 3 from the garage; mroe steps at front entrance but patient reports he only uses garageStairs to enter.  Patient will benefit from skilled PT intervention to maximize safe functional mobility, minimize fall risk and decrease caregiver burden for planned discharge home with intermittent  assist.  Anticipate patient will benefit from follow up [facility] at discharge.  PT - End of Session Activity Tolerance: Tolerates 30+ min activity with multiple rests Endurance Deficit: Yes Endurance Deficit Description: Fatigues quickly, requires frequent rest breaks; desaturation an issue as well with activity PT Assessment Rehab Potential (ACUTE/IP ONLY): Good PT Patient demonstrates impairments in the following area(s): Balance;Endurance;Motor PT Transfers Functional Problem(s): Bed Mobility;Bed to Chair;Car;Furniture;Floor PT Locomotion Functional Problem(s): Ambulation;Stairs;Wheelchair Mobility PT Plan PT Intensity: Minimum of 1-2 x/day ,45 to 90 minutes PT Frequency: 5 out of 7 days PT Duration Estimated Length of Stay: 5-7 days PT Treatment/Interventions: Ambulation/gait training;Disease management/prevention;Pain management;Stair training;Wheelchair propulsion/positioning;Therapeutic Activities;Patient/family education;DME/adaptive equipment instruction;Balance/vestibular training;Psychosocial support;Therapeutic Exercise;UE/LE Strength taining/ROM;Functional mobility training;Community reintegration;Discharge planning;Neuromuscular re-education;UE/LE Coordination activities PT Transfers Anticipated Outcome(s): mod I PT Locomotion Anticipated Outcome(s): mod I ambulation PT Recommendation Follow Up Recommendations: Home health PT;Outpatient PT (HHPT vs OPPT; pt prefers HHPT) Patient destination: Home Equipment Recommended: Rolling walker with 5" wheels Equipment Details: Patient owns rollator; will likely require RW due to hx of several falls and decreased safety with use of RW  Skilled Therapeutic Intervention Patient seen for initial PT evaluation. Discussed falls risk, safety within room, ELOS, goals, and f/u therapy. Patient verbalized understanding.  PT Evaluation Precautions/Restrictions Precautions Precautions: Fall Precaution Comments: Multiple falls  pta. Restrictions Weight Bearing Restrictions: No General Chart Reviewed: Yes Family/Caregiver Present: No  Vital SignsTherapy Vitals Pulse Rate: (!) 58 BP: (!) 96/46 mmHg Patient Position (if appropriate): Sitting Oxygen Therapy SpO2: 96 % O2 Device: Not Delivered Pain Pain Assessment Pain Assessment: No/denies pain Pain Score: 0-No pain Home Living/Prior Functioning Home Living Available Help at Discharge: Family;Available 24 hours/day Type of Home: House Home Access: Stairs to enter [employer] of Steps: 3 from the garage; mroe steps at front entrance but patient reports he only uses garage Entrance Stairs-Rails: Can reach both Home Layout: One level  Lives With: Spouse Prior Function Level of Independence: Requires assistive device for independence  Able to Take Stairs?: Yes Driving: No Vocation: Retired Public house manager Requirements: [employer]: Hobbies-yes (Comment) Comments: golf, grandson Vision/Perception    See OT note. Wears glasses all the time; No change in vision from baseline.  Praxis/perception intact. Cognition Overall Cognitive Status: Within Functional Limits for tasks assessed Arousal/Alertness: Awake/alert Orientation Level: Oriented X4 Attention: Selective Selective Attention: Appears intact Memory: Appears intact Awareness: Appears intact Problem Solving: Appears intact Safety/Judgment: Appears intact Sensation Sensation Light Touch: Appears Intact Stereognosis: Not tested Hot/Cold: Appears Intact Proprioception: Appears Intact Coordination Gross Motor Movements are Fluid and Coordinated: Yes Fine Motor Movements are Fluid and Coordinated: Yes Motor  Motor Motor: Within Functional Limits  Mobility Bed Mobility Bed Mobility: Supine to Sit;Sit to Supine Supine to Sit: 5: Supervision;HOB flat Sit to Supine: 5: Supervision;HOB flat Transfers Transfers: Yes Sit to Stand: 5: Supervision;With  armrests;From bed;From chair/3-in-1;With upper extremity assist  Sit to Stand Details: Verbal cues for technique Stand to Sit: 5: Supervision;To chair/3-in-1;To bed;With upper extremity assist;With armrests Stand to Sit Details (indicate cue type and reason): Verbal cues for technique Stand Pivot Transfers: 5: Supervision;With armrests Stand Pivot Transfer Details: Verbal cues for technique (with RW) Locomotion  Ambulation Ambulation: Yes Ambulation/Gait Assistance: 5: Supervision Ambulation Distance (Feet): 180 Feet Assistive device: Rolling walker Ambulation/Gait Assistance Details: Verbal cues for safe use of DME/AE;Verbal cues for gait pattern Ambulation/Gait Assistance Details: Patient performed gait training in controlled environment 165' x1 and 175' x1 and home environments 30' x1 (in ADL apartment on carpet) with RW and supervision/verbal cues for upright posture and increased step/stride length. Gait Gait: Yes Gait Pattern: Impaired Gait Pattern: Step-through pattern;Decreased stride length;Shuffle Stairs / Additional Locomotion Stairs: Yes Stairs Assistance: 5: Supervision Stairs Assistance Details: Verbal cues for sequencing;Verbal cues for technique Stair Management Technique: Two rails;Alternating pattern Number of Stairs: 4 Height of Stairs: 6 Wheelchair Mobility Wheelchair Mobility: No (patient ambulatory)  Trunk/Postural Assessment  Cervical Assessment Cervical Assessment: Exceptions to WFL (slight forward head posture) Thoracic Assessment Thoracic Assessment: Within Functional Limits Lumbar Assessment Lumbar Assessment: Within Functional Limits Postural Control Postural Control: Within Functional Limits  Balance Balance Balance Assessed: Yes Standardized Balance Assessment Standardized Balance Assessment: Timed Up and Go Test Timed Up and Go Test TUG: Normal TUG Normal TUG (seconds): 20.51 (scores >13.5" indicate increased risk for falls) Static Sitting  Balance Static Sitting - Balance Support: Bilateral upper extremity supported;No upper extremity supported;Feet supported Static Sitting - Level of Assistance: 6: Modified independent (Device/Increase time) Dynamic Sitting Balance Dynamic Sitting - Balance Support: Bilateral upper extremity supported;No upper extremity supported;Feet supported Dynamic Sitting - Level of Assistance: 6: Modified independent (Device/Increase time) Static Standing Balance Static Standing - Balance Support: Bilateral upper extremity supported;During functional activity Static Standing - Level of Assistance: 5: Stand by assistance Extremity Assessment      RLE Assessment RLE Assessment: Exceptions to [facility] RLE Strength RLE Overall Strength: Deficits RLE Overall Strength Comments: Grossly 3+/5 except 4/5 for knee ext LLE Assessment LLE Assessment: Exceptions to [facility] LLE Strength LLE Overall Strength: Deficits LLE Overall Strength Comments: Grossly 3+/5 except 4/5 for knee ext  FIM:  FIM - Bed/Chair Transfer Bed/Chair Transfer Assistive Devices: Arm rests;Walker Bed/Chair Transfer: 5: Supine > Sit: Supervision (verbal cues/safety issues);5: Sit > Supine: Supervision (verbal cues/safety issues);5: Bed > Chair or W/C: Supervision (verbal cues/safety issues);5: Chair or W/C > Bed: Supervision (verbal cues/safety issues) FIM - Locomotion: Wheelchair Locomotion: Wheelchair: 0: Activity did not occur (patient ambulatory) FIM - Locomotion: Ambulation Locomotion: Ambulation Assistive Devices: Administrator Ambulation/Gait Assistance: 5: Supervision Locomotion: Ambulation: 5: Travels 150 ft or more with supervision/safety issues FIM - Locomotion: Stairs Locomotion: Scientist, physiological: Hand rail - 2 Locomotion: Stairs: 2: Up and Down 4 - 11 stairs with supervision/safety concerns/verbal cues   Refer to Care Plan for Long Term Goals  Recommendations for other services: None  Discharge Criteria: Patient  will be discharged from PT if patient refuses treatment 3 consecutive times without medical reason, if treatment goals not met, if there is a change in medical status, if patient makes no progress towards goals or if patient is discharged from hospital.  The above assessment, treatment plan, treatment alternatives and goals were discussed and mutually agreed upon: by patient  Lillia Abed. Klay Sobotka, PT, DPT 10/14/2014, 12:32 PM

## 2014-10-14 NOTE — Progress Notes (Signed)
Physical Therapy Session Note  Patient Details  Name: [name] MRN: [id number] Date of Birth: 1935-08-19  Today's Date: 10/14/2014 PT Individual Time: 1300-1330  PT Individual Time Calculation (min): 30 min   Short Term Goals: Week 1:  PT Short Term Goal 1 (Week 1): STGs=LTGs  Skilled Therapeutic Interventions/Progress Updates:  Therapeutic Exercise: PT introduces pt to [place] exercises for generalized conditioning x 10 reps each with RW for balance: mini-squats, heel/toe raises, standing knee flexion, standing hip abductton, and LAQ with 3# ankle weights.  PT instructs pt on Nu-Step at L4 for improving cardiorespiratory endurance/activity tolerance x 5 minutes.   Gait Training: PT instructs pt in ambulation with RW 2 x 150' req SBA for safety. PT instructs pt in ascending/descending 1 flight of stairs (12 steps) in step-over-step pattern with B handrails req SBA for safety.   [name] main deficits appear to be balance with heavy reliance on RW for support and generalized deconditioning/low activity tolerance. Continue per PT POC.   Therapy Documentation Precautions:  Precautions Precautions: Fall Precaution Comments: Multiple falls pta. Restrictions Weight Bearing Restrictions: No Pain: Pain Assessment Pain Assessment: 0-10 Pain Score: 5  Pain Type: Chronic pain Pain Location: Back Pain Orientation: Lower Pain Descriptors / Indicators: Sore Pain Onset: On-going Pain Intervention(s): Emotional support;Rest Multiple Pain Sites: No  See FIM for current functional status  Therapy/Group: Individual Therapy  Lanyiah Brix M 10/14/2014, 12:56 PM

## 2014-10-15 ENCOUNTER — Inpatient Hospital Stay (HOSPITAL_COMMUNITY): Payer: Medicare Other

## 2014-10-15 LAB — CBC
HCT: 24.6 % — ABNORMAL LOW (ref 39.0–52.0)
HEMATOCRIT: 25.8 % — AB (ref 39.0–52.0)
HEMOGLOBIN: 8.1 g/dL — AB (ref 13.0–17.0)
Hemoglobin: 8.5 g/dL — ABNORMAL LOW (ref 13.0–17.0)
MCH: 30.2 pg (ref 26.0–34.0)
MCH: 30.2 pg (ref 26.0–34.0)
MCHC: 32.9 g/dL (ref 30.0–36.0)
MCHC: 32.9 g/dL (ref 30.0–36.0)
MCV: 91.8 fL (ref 78.0–100.0)
MCV: 91.8 fL (ref 78.0–100.0)
Platelets: 137 10*3/uL — ABNORMAL LOW (ref 150–400)
Platelets: 146 10*3/uL — ABNORMAL LOW (ref 150–400)
RBC: 2.68 MIL/uL — ABNORMAL LOW (ref 4.22–5.81)
RBC: 2.81 MIL/uL — AB (ref 4.22–5.81)
RDW: 17.4 % — ABNORMAL HIGH (ref 11.5–15.5)
RDW: 17.1 % — ABNORMAL HIGH (ref 11.5–15.5)
WBC: 7 10*3/uL (ref 4.0–10.5)
WBC: 7.5 10*3/uL (ref 4.0–10.5)

## 2014-10-15 LAB — BASIC METABOLIC PANEL
Anion gap: 10 (ref 5–15)
BUN: 31 mg/dL — AB (ref 6–20)
CO2: 32 mmol/L (ref 22–32)
CREATININE: 2.01 mg/dL — AB (ref 0.61–1.24)
Calcium: 7.9 mg/dL — ABNORMAL LOW (ref 8.9–10.3)
Chloride: 86 mmol/L — ABNORMAL LOW (ref 101–111)
GFR calc Af Amer: 35 mL/min — ABNORMAL LOW (ref >60)
GFR, EST NON AFRICAN AMERICAN: 30 mL/min — AB (ref >60)
GLUCOSE: 100 mg/dL — AB (ref 65–99)
POTASSIUM: 3.6 mmol/L (ref 3.5–5.1)
SODIUM: 128 mmol/L — AB (ref 135–145)

## 2014-10-15 LAB — GLUCOSE, CAPILLARY
GLUCOSE-CAPILLARY: 104 mg/dL — AB (ref 65–99)
GLUCOSE-CAPILLARY: 140 mg/dL — AB (ref 65–99)
GLUCOSE-CAPILLARY: 127 mg/dL — AB (ref 65–99)
Glucose-Capillary: 115 mg/dL — ABNORMAL HIGH (ref 65–99)

## 2014-10-15 MED ORDER — ISOSORBIDE MONONITRATE ER 30 MG PO TB24
30.0000 mg | ORAL_TABLET | Freq: Every day | ORAL | Status: DC
  Administered 2014-10-16 – 2014-10-18 (×3): 30 mg via ORAL
  Filled 2014-10-15 (×4): qty 1

## 2014-10-15 MED ORDER — HYDRALAZINE HCL 25 MG PO TABS
25.0000 mg | ORAL_TABLET | Freq: Three times a day (TID) | ORAL | Status: DC
  Administered 2014-10-15 – 2014-10-18 (×5): 25 mg via ORAL
  Filled 2014-10-15 (×12): qty 1

## 2014-10-15 MED ORDER — TORSEMIDE 20 MG PO TABS
40.0000 mg | ORAL_TABLET | Freq: Every day | ORAL | Status: DC
  Administered 2014-10-16 – 2014-10-18 (×3): 40 mg via ORAL
  Filled 2014-10-15 (×4): qty 2

## 2014-10-15 MED ORDER — AMOXICILLIN-POT CLAVULANATE 500-125 MG PO TABS
1.0000 | ORAL_TABLET | Freq: Two times a day (BID) | ORAL | Status: AC
  Administered 2014-10-15 – 2014-10-18 (×7): 500 mg via ORAL
  Filled 2014-10-15 (×7): qty 1

## 2014-10-15 NOTE — Progress Notes (Signed)
BP=89/42, HR=52. Paged Dr. Inda Merlin, orders to hold scheduled apresoline, tonight only. Reports Kpad effective for chronic back pain.  Wearing O2 at 2L/M via [place] at HS. [name] A

## 2014-10-15 NOTE — Progress Notes (Signed)
Physical Therapy Session Note  Patient Details  Name: [name] MRN: [id number] Date of Birth: 1935-09-05  Today's Date: 10/15/2014 PT Individual Time: 1345-1445 PT Individual Time Calculation (min): 60 min   Short Term Goals: Week 1:  PT Short Term Goal 1 (Week 1): STGs=LTGs  Skilled Therapeutic Interventions/Progress Updates:   Session focused on overall activity tolerance and endurance, functional gait with and without AD to challenge balance with emphasis on posture, balance, and gait quality (S with RW and close S/steady A without AD), dynamic standing balance activities for functional tasks at sink (S to mod I) and progressed to compliant surface without UE support with close S/steady A, stair negotiation training for community mobility training up/down 12 steps with S using rails for support, and Nustep with BLE's only for strengthening and reciprocal movement pattern training x 5 min on level 5. Pt demonstrating good mobility and improving endurance with rest breaks as needed throughout session with good insight for self monitoring. Left EOB in room at end of session with all needs in reach.   Therapy Documentation Precautions:  Precautions Precautions: Fall Precaution Comments: Multiple falls pta. Restrictions Weight Bearing Restrictions: No  Pain:  c/o chronic back pain - using heating pad for relief.   Locomotion : Ambulation Ambulation/Gait Assistance: 4: Min guard   See FIM for current functional status  Therapy/Group: Individual Therapy  Canary Brim Ivory Broad, PT, DPT  10/15/2014, 3:35 PM

## 2014-10-15 NOTE — Progress Notes (Signed)
[name] is a 79 y.o. male 06/15/1935 [id number]  Subjective: No new complaints. No new problems. Reports cough unchanged (chronic PTA) and denies dizziness or presyncope symptoms with activity. No shortness of breath or chest pain   Objective: Vital signs in last 24 hours: Temp:  [97.5 F (36.4 C)-98.5 F (36.9 C)] 98.5 F (36.9 C) (05/22 0500) Pulse Rate:  [52-66] 66 (05/22 0500) Resp:  [17-18] 17 (05/22 0500) BP: (87-119)/(42-54) 87/54 mmHg (05/22 0500) SpO2:  [94 %-98 %] 94 % (05/22 0500) Weight:  [66.2 kg (145 lb 15.1 oz)] 66.2 kg (145 lb 15.1 oz) (05/22 0500) Weight change: 0.3 kg (10.6 oz) Last BM Date: 10/13/14  Intake/Output from previous day: 05/21 0701 - 05/22 0700 In: 1320 [P.O.:1320] Out: 1150 [Urine:1150]  Physical Exam General: No apparent distress Supine in bed with head elevated   Lungs: Normal effort. Lungs clear to auscultation anterior, no crackles or wheezes. Cardiovascular: irregular rate and rhythm, no edema Musculoskeletal:  Neurovascularly intact Wounds: N/A      Lab Results: BMET    Component Value Date/Time   NA 128* 10/15/2014 0550   K 3.6 10/15/2014 0550   CL 86* 10/15/2014 0550   CO2 32 10/15/2014 0550   GLUCOSE 100* 10/15/2014 0550   GLUCOSE 117* 03/16/2006 0913   BUN 31* 10/15/2014 0550   CREATININE 2.01* 10/15/2014 0550   CALCIUM 7.9* 10/15/2014 0550   GFRNONAA 30* 10/15/2014 0550   GFRAA 35* 10/15/2014 0550   CBC    Component Value Date/Time   WBC 7.0 10/15/2014 0745   RBC 2.68* 10/15/2014 0745   RBC 3.24* 09/11/2014 0216   HGB 8.1* 10/15/2014 0745   HCT 24.6* 10/15/2014 0745   PLT 137* 10/15/2014 0745   MCV 91.8 10/15/2014 0745   MCH 30.2 10/15/2014 0745   MCHC 32.9 10/15/2014 0745   RDW 17.4* 10/15/2014 0745   LYMPHSABS 1.5 10/14/2014 0527   MONOABS 1.1* 10/14/2014 0527   EOSABS 0.2 10/14/2014 0527   BASOSABS 0.0 10/14/2014 0527   CBG's (last 3):    Recent Labs  10/14/14 1645 10/14/14 2101  10/15/14 0644  GLUCAP 104* 133* 104*   LFT's Lab Results  Component Value Date   ALT 18 10/14/2014   AST 52* 10/14/2014   ALKPHOS 92 10/14/2014   BILITOT 1.3* 10/14/2014    Studies/Results: Dg Chest 2 View  10/14/2014   CLINICAL DATA:  Hospital acquired pneumonia.  Ex-smoker.  EXAM: CHEST  2 VIEW  COMPARISON:  10/09/2014.  FINDINGS: Stable enlarged cardiac silhouette, post CABG changes and left subclavian and AICD leads. Stable prominence of the interstitial markings and calcified pleural plaques. Small amount of bilateral pleural thickening or fluid. Interval mild patchy opacity in the right mid lung zone. The hemidiaphragms remain flattened.  IMPRESSION: 1. Interval mild patchy opacity suspicious for pneumonia in the right mid lung zone. 2. Stable changes of COPD with interstitial fibrosis. 3. 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   Electronically Signed   By: Claudie Revering M.D.   On: 10/14/2014 10:29    Medications:  I have reviewed the patient's current medications. Scheduled Medications: . amiodarone  200 mg Oral Daily  . amoxicillin-clavulanate  1 tablet Oral BID  . apixaban  2.5 mg Oral BID  . budesonide (PULMICORT) nebulizer solution  0.25 mg Nebulization BID  . carvedilol  3.125 mg Oral BID WC  . folic acid  1 mg Oral Daily  . guaiFENesin  600 mg Oral BID  . hydrALAZINE  25 mg Oral TID  . insulin aspart  0-9 Units Subcutaneous TID WC  . insulin glargine  10 Units Subcutaneous QHS  . isosorbide mononitrate  30 mg Oral Daily  . levothyroxine  75 mcg Oral QAC breakfast  . pantoprazole  40 mg Oral Q0600  . potassium chloride  20 mEq Oral BID  . pravastatin  80 mg Oral q1800  . saccharomyces boulardii  250 mg Oral BID  . thiamine  100 mg Oral Daily  . tiotropium  18 mcg Inhalation Daily  . torsemide  40 mg Oral Daily   PRN Medications: acetaminophen, alum & mag hydroxide-simeth, bisacodyl, guaiFENesin-dextromethorphan, ondansetron **OR**  ondansetron (ZOFRAN) IV, ondansetron **OR** ondansetron (ZOFRAN) IV, senna-docusate, traZODone  Assessment/Plan: Principal Problem:   Physical deconditioning Active Problems:   Type 2 diabetes mellitus, uncontrolled   HCAP (healthcare-associated pneumonia)   Hospital acquired PNA  1. Functional deficits secondary to debility due to PNA/CHF and multiple medical 2. DVT Prophylaxis/Anticoagulation: Pharmaceutical: Other (comment)-Eliquis resumed yesterday 3. Pain Management: N/A 4. Mood: LCSW to follow for evaluation and support.  5. Neuropsych: This patient is capable of making decisions on his own behalf. 6. Skin/Wound Care: routine pressure relief measures.  7. Fluids/Electrolytes/Nutrition: Monitor I/o. Continue nutritional supplements.  8. Chronic systolic CHF: Monitor for signs of overload. Check daily weights. On coreg, Imdur and demadex. Held hydralazine today (5/21) 9. Recurrent GIB: serial H/H. Will monitor for signs of recurrent bleeding on chronic anticoag.  10. Severe COPD: Respiratory status stable on budesonide and spiriva. Continue oxygen at nights.  11. CKD: Monitor with routine checks. Avoid nephrotoxic medications. No ACE/ARB due to CKD.  12. PAF: Monitor heart rate tid. Continue amiodarone and eliquis anticoag 13. DM type 2-controlled: Monitor BS with ac/hs checks. Continue lantus 10 untis at bedtime with SSI for tighter BS control.  14. HTN hx - remains hypotensive; Monitor every 8 hours. hold Hydralazine, Imdur and diuretic today due to asymptomatic hypotension; continue coreg for CHF.  15. ABLA: H/H slowly improving. 16. Thrombocytopenia: Continue to monitor platelets as well as for signs of bleeding.  17. Persistent Hypokalemia: Will add daily supplement 18. HCAP: Initially RLL changes s/w aspiration. Continues to have thick mucopurulent sputum production with cough--continue mucinex. Encourage IS. s/p 4 d IV vancomycin/Zosyn --transitioned to augmentin 5/20  for 5 additional days. follow up CXR 5/21 AM with patchy RML changes. O2 stable. Patient advised to be upright for meals. Continue observation 19. Hypothyroid - continue synthroid replacement 20. Dyslipidemia - continue prava  Length of stay, days: 2   Valerie A. Asa Lente, MD 10/15/2014, 9:07 AM

## 2014-10-16 ENCOUNTER — Inpatient Hospital Stay (HOSPITAL_COMMUNITY): Payer: Medicare Other

## 2014-10-16 ENCOUNTER — Encounter (HOSPITAL_COMMUNITY): Payer: Medicare Other

## 2014-10-16 ENCOUNTER — Inpatient Hospital Stay (HOSPITAL_COMMUNITY): Payer: Medicare Other | Admitting: Occupational Therapy

## 2014-10-16 LAB — CBC
HEMATOCRIT: 25.4 % — AB (ref 39.0–52.0)
HEMATOCRIT: 24.6 % — AB (ref 39.0–52.0)
HEMOGLOBIN: 8.3 g/dL — AB (ref 13.0–17.0)
HEMOGLOBIN: 8.2 g/dL — AB (ref 13.0–17.0)
MCH: 30.1 pg (ref 26.0–34.0)
MCH: 29.9 pg (ref 26.0–34.0)
MCHC: 32.7 g/dL (ref 30.0–36.0)
MCHC: 33.3 g/dL (ref 30.0–36.0)
MCV: 92 fL (ref 78.0–100.0)
MCV: 89.8 fL (ref 78.0–100.0)
PLATELETS: 149 10*3/uL — AB (ref 150–400)
PLATELETS: 124 10*3/uL — AB (ref 150–400)
RBC: 2.76 MIL/uL — AB (ref 4.22–5.81)
RBC: 2.74 MIL/uL — ABNORMAL LOW (ref 4.22–5.81)
RDW: 17.1 % — ABNORMAL HIGH (ref 11.5–15.5)
RDW: 16.8 % — ABNORMAL HIGH (ref 11.5–15.5)
WBC: 7.2 10*3/uL (ref 4.0–10.5)
WBC: 8.4 10*3/uL (ref 4.0–10.5)

## 2014-10-16 LAB — GLUCOSE, CAPILLARY
Glucose-Capillary: 116 mg/dL — ABNORMAL HIGH (ref 65–99)
Glucose-Capillary: 102 mg/dL — ABNORMAL HIGH (ref 65–99)
Glucose-Capillary: 109 mg/dL — ABNORMAL HIGH (ref 65–99)
Glucose-Capillary: 145 mg/dL — ABNORMAL HIGH (ref 65–99)

## 2014-10-16 NOTE — Progress Notes (Signed)
Occupational Therapy Note  Patient Details  Name: NAVDEEP HALT MRN: [id number] Date of Birth: 1935/09/10  Today's Date: 10/16/2014 OT Individual Time: 1100-1200 OT Individual Time Calculation (min): 60 min   Pt denies pain Individual therapy  Pt resting in recliner upon arrival and agreeable to therapy.  Pt amb with RW to therapy gym.  Pt required rest after arriving in gym.  Pt initially engaged in dynamic standing activities including use of Zoom Ball.  Pt required min A with Zoom Ball and exhibited LOB X 2.  Pt transitioned to BUE therex on [employer] (7 mins random at work load 4).  Pt required rest breaks X 4 during activity.  Pt transitioned to NuStep (8 mins at work load 4 with 3 rest breaks.) Pt returned to room and remained in recliner with wife present.  Focus on activity tolerance, dynamic standing balance, and safety awareness.   Leotis Shames [facility] 10/16/2014, 12:04 PM

## 2014-10-16 NOTE — Progress Notes (Signed)
Occupational Therapy Session Note  Patient Details  Name: [name] MRN: [id number] Date of Birth: 1935/08/03  Today's Date: 10/16/2014 OT Individual Time: 4765-4650 OT Individual Time Calculation (min): 60 min    Short Term Goals: Week 1:  OT Short Term Goal 1 (Week 1): STGs= LTGs secondary to estimated short LOS  Skilled Therapeutic Interventions/Progress Updates: ADL-retraining with focus on improved safety awareness, endurance, and dynamcis standing balance during BADL.   Pt re-educated on use of RW safely while transferring out of bed and ambulating to bathroom.    Pt reported frustration with use of oxygen, stating his has no need for it and preferred attempting BADL during entire session without it.   OT assessed 02 sats throughout session which revealed consistent reading of 97% while resting lying, sitting or standing and dipped to no lower than 94% immediately after activities, standing and sitting.   Pt bathed unassisted and appeared to either forget or ignore cue to remove his underwear which was wet from showering.   After being challenged by therapist on why he continued to wear his underwear in shower pt confided that he had soiled them (small BM, accident while in bed) and wanted to wash them out.   Pt also demo'd difficulty lifting his left leg while dressing and required assist to lace left leg through underwear and pants.   LLE weakness also prevented pt from donning left shoe.    Pt then ambulated to sink and groomed standing with supervision for safety.   Pt left with medical staff in room at end of session.     Therapy Documentation Precautions:  Precautions Precautions: Fall Precaution Comments: Multiple falls pta. Restrictions Weight Bearing Restrictions: No  Vital Signs: Oxygen Therapy O2 Device: Not Delivered  Pain: Pain Assessment Pain Assessment: No/denies pain  See FIM for current functional status  Therapy/Group: Individual  Therapy  [place] 10/16/2014, 12:51 PM

## 2014-10-16 NOTE — IPOC Note (Signed)
Overall Plan of Care [facility]) Patient Details Name: ROMUALDO PROSISE MRN: [id number] DOB: August 07, 1935  Admitting Diagnosis: debility  Hospital Problems: Principal Problem:   Physical deconditioning Active Problems:   Type 2 diabetes mellitus, uncontrolled   HCAP (healthcare-associated pneumonia)   Hospital acquired PNA     Functional Problem List: Nursing Bowel, Edema, Endurance, Medication Management, Motor, Nutrition, Pain, Skin Integrity  PT Balance, Endurance, Motor  OT Balance, Endurance, Motor, Safety  SLP    TR         Basic ADL's: OT Grooming, Bathing, Dressing, Toileting     Advanced  ADL's: OT  (n/a)     Transfers: PT Bed Mobility, Bed to Chair, Car, [employer], Futures trader, Metallurgist: PT Ambulation, Stairs, Wheelchair Mobility     Additional Impairments: OT None  SLP        TR      Anticipated Outcomes Item Anticipated Outcome  Self Feeding n/a  Swallowing      Basic self-care  Mod I   Toileting  Mod I    Bathroom Transfers Mod I   Bowel/Bladder  Mod I  Transfers  mod I  Locomotion  mod I ambulation  Communication     Cognition     Pain  <3  Safety/Judgment  Mod I    Therapy Plan: PT Intensity: Minimum of 1-2 x/day ,45 to 90 minutes PT Frequency: 5 out of 7 days PT Duration Estimated Length of Stay: 5-7 days OT Intensity: Minimum of 1-2 x/day, 45 to 90 minutes OT Frequency: 5 out of 7 days OT Duration/Estimated Length of Stay: 5- 7 days         Team Interventions: Nursing Interventions Patient/Family Education, Bowel Management, Medication Management, Pain Management, Skin Care/Wound Management, Disease Management/Prevention  PT interventions Ambulation/gait training, Disease management/prevention, Pain management, Stair training, Wheelchair propulsion/positioning, Therapeutic Activities, Patient/family education, Engineer, drilling, Training and development officer, Psychosocial support,  Therapeutic Exercise, UE/LE Strength taining/ROM, Functional mobility training, Community reintegration, Discharge planning, Neuromuscular re-education, UE/LE Coordination activities  OT Interventions Training and development officer, Discharge planning, Self Care/advanced ADL retraining, Therapeutic Activities, UE/LE Coordination activities, Cognitive remediation/compensation, Functional mobility training, Patient/family education, Therapeutic Exercise, Community reintegration, Engineer, drilling, Neuromuscular re-education, Psychosocial support, UE/LE Strength taining/ROM, Wheelchair propulsion/positioning  SLP Interventions    TR Interventions    SW/CM Interventions Discharge Planning, Barrister's clerk, Patient/Family Education    Team Discharge Planning: Destination: PT-Home ,OT- Home , SLP-  Projected Follow-up: PT-Home health PT, Outpatient PT (HHPT vs OPPT; pt prefers HHPT), OT-  Outpatient OT, SLP-  Projected Equipment Needs: PT-Rolling walker with 5" wheels, OT- To be determined, SLP-  Equipment Details: PT-Patient owns rollator; will likely require RW due to hx of several falls and decreased safety with use of RW, OT-  Patient/family involved in discharge planning: PT- Patient,  OT-Patient, SLP-   MD ELOS: 7-9d Medical Rehab Prognosis:  Good Assessment: 79 y.o. male with history of CAD, VT/ICD, DM type 2, COPD-oxygen HS, chronic systolic CHF-milrinone, recent admission 08/2014 for GIB, candida esophagitis and PNA who was readmitted 10/09/14 with worsening of weakness, increasing DOE and falls. CT head without acute changes. Patient was noted to be anemic with Hgb 7.2, positive FOBT and CXR showed evidence of volume overload and PNA. He was transfused with 1 units PRBC and started on IV anbiotics for PNA. Dr. Haroldine Laws consulted for input on chronic AC held and diuretics resumed to help with fluid overload. Dr. Henrene Pastor consulted for input on colonoscopy and  expressed concerns as  prior attempts at colonoscopy unsuccessful due to sigmoid stenosis. He recommended capsule endoscopy for work up with transfusion as indicated and resume AC if H/H stable. Swallow evaluation done and negative for signs of dysphagia. Capsule endoscopy done yesterday and eliquis resumed as no signs of active bleeding noted  Now requiring 24/7 Rehab RN,MD, as well as CIR level PT, OT .  Treatment team will focus on ADLs and mobility with goals set at Mod I   See Team Conference Notes for weekly updates to the plan of care

## 2014-10-16 NOTE — Progress Notes (Signed)
Physical Therapy Session Note  Patient Details  Name: [name] MRN: [id number] Date of Birth: [birth year]  Today's Date: 10/16/2014 PT Individual Time: 1300-1400 PT Individual Time Calculation (min): 60 min   Short Term Goals: Week 1:  PT Short Term Goal 1 (Week 1): STGs=LTGs  Skilled Therapeutic Interventions/Progress Updates:   Session focused on overall endurance and activity tolerance, functional gait on unit and on carpeted surface to day room and around unit with RW and overall S, dynamic standing balance activity to address balance without UE support to putt golf balls with S for balance without AD and rest breaks as needed due to back pain, simulated car transfer mod I to prepare for d/c, and stair negotiation for functional strengthening and community mobility training at S level using rails. Offered pt use of our rollator as he uses this at home for long distances but pt declines and is ok with using RW at this time. Pt voicing that he would feels like he is ready to d/c Wed or Thurs. From PT standpoint, in agreement, but will discuss with primary OT and MD (conference is tomorrow as well). Returned to room end of session and positioned in recliner with all needs in reach.   Therapy Documentation Precautions:  Precautions Precautions: Fall Precaution Comments: Multiple falls pta. Restrictions Weight Bearing Restrictions: No  Pain: Pain Assessment Pain Assessment: No/denies pain Locomotion : Ambulation Ambulation/Gait Assistance: 5: Supervision   See FIM for current functional status  Therapy/Group: Individual Therapy  Canary Brim Ivory Broad, PT, DPT  10/16/2014, 3:23 PM

## 2014-10-16 NOTE — Care Management Note (Signed)
[facility] Individual Statement of Services  Patient Name:  [name]  Date:  10/16/2014  Welcome to the [facility].  Our goal is to provide you with an individualized program based on your diagnosis and situation, designed to meet your specific needs.  With this comprehensive rehabilitation program, you will be expected to participate in at least 3 hours of rehabilitation therapies Monday-Friday, with modified therapy programming on the weekends.  Your rehabilitation program will include the following services:  Physical Therapy (PT), Occupational Therapy (OT), 24 hour per day rehabilitation nursing, Therapeutic Recreaction (TR), Case Management (Social Worker), Rehabilitation Medicine, Nutrition Services and Pharmacy Services  Weekly team conferences will be held on Tuesdays to discuss your progress.  Your Social Worker will talk with you frequently to get your input and to update you on team discussions.  Team conferences with you and your family in attendance may also be held.  Expected length of stay: 5-7 days  Overall anticipated outcome: modified independent  Depending on your progress and recovery, your program may change. Your Social Worker will coordinate services and will keep you informed of any changes. Your Social Worker's name and contact numbers are listed  below.  The following services may also be recommended but are not provided by the [facility] will be made to provide these services after discharge if needed.  Arrangements include referral to agencies that provide these services.  Your insurance has been verified to be:  Medicare and [place] Your primary doctor is:  Dr. Anitra Lauth  Pertinent information will be shared with your doctor and your insurance company.  Social Worker:  [place],  Hiram or ([phone]   Information discussed with and copy given to patient by: Lennart Pall, 10/16/2014, 10:27 AM

## 2014-10-16 NOTE — Progress Notes (Signed)
[facility] PHYSICAL MEDICINE & REHABILITATION     PROGRESS NOTE    Subjective/Complaints: Had a good weekend. Getting stronger. Pleased with therapy. No sob, cp. Has cough (chronic)  Objective: Vital Signs: Blood pressure 121/48, pulse 64, temperature 98.5 F (36.9 C), temperature source Oral, resp. rate 17, height 5\' 7"  (1.702 m), weight 66.5 kg (146 lb 9.7 oz), SpO2 100 %. No results found.  Recent Labs  10/15/14 0745 10/15/14 1805  WBC 7.0 7.5  HGB 8.1* 8.5*  HCT 24.6* 25.8*  PLT 137* 146*    Recent Labs  10/14/14 0527 10/15/14 0550  NA 130* 128*  K 3.4* 3.6  CL 87* 86*  GLUCOSE 118* 100*  BUN 31* 31*  CREATININE 1.89* 2.01*  CALCIUM 8.2* 7.9*   CBG (last 3)   Recent Labs  10/15/14 1638 10/15/14 2044 10/16/14 0639  GLUCAP 140* 127* 116*    Wt Readings from Last 3 Encounters:  10/16/14 66.5 kg (146 lb 9.7 oz)  10/13/14 65.545 kg (144 lb 8 oz)  09/27/14 68.856 kg (151 lb 12.8 oz)    Physical Exam:  Constitutional: He is oriented to person, place, and time. He appears well-developed.  HENT:  Head: Normocephalic and atraumatic.  Eyes: Conjunctivae are normal. Pupils are equal, round, and reactive to light.  Neck: Neck supple.  Cardiovascular: Normal rate and regular rhythm. no murmur Respiratory: Effort normal. No respiratory distress. He has decreased breath sounds at bases. He has wheezes insp and exp   GI: Soft. Bowel sounds are normal. He exhibits no distension. There is no tenderness.  Musculoskeletal: He exhibits edema (trace edema BUE with multiple ecchymotic areas.). He exhibits no tenderness.  Neurological: He is alert and oriented to person, place, and time.  Speech clear. Follows basic commands without difficulty. Decreased sensation R-foot. UE: 4/5 prox to distal. LE: 2/5hf, 3+ke and 3+ adf/paf. Cognitively appropriate with normal memory, insight, and awareness. Skin: Skin is warm and dry.  Psych: pleasant and  appropriate.  Assessment/Plan: 1. Functional deficits secondary to debility after PNA/CHF which require 3+ hours per day of interdisciplinary therapy in a comprehensive inpatient rehab setting. Physiatrist is providing close team supervision and 24 hour management of active medical problems listed below. Physiatrist and rehab team continue to assess barriers to discharge/monitor patient progress toward functional and medical goals. FIM: FIM - Bathing Bathing Steps Patient Completed: Chest, Right Arm, Left Arm, Abdomen, Front perineal area, Right upper leg, Left upper leg, Right lower leg (including foot), Left lower leg (including foot) Bathing: 4: Min-Patient completes 8-9 83f 10 parts or 75+ percent  FIM - Upper Body Dressing/Undressing Upper body dressing/undressing steps patient completed: Thread/unthread right sleeve of pullover shirt/dresss, Thread/unthread left sleeve of pullover shirt/dress, Put head through opening of pull over shirt/dress, Pull shirt over trunk Upper body dressing/undressing: 5: Set-up assist to: Obtain clothing/put away FIM - Lower Body Dressing/Undressing Lower body dressing/undressing steps patient completed: Thread/unthread right underwear leg, Thread/unthread left underwear leg, Pull underwear up/down, Thread/unthread right pants leg, Thread/unthread left pants leg, Pull pants up/down, Don/Doff right shoe, Don/Doff left shoe, Fasten/unfasten right shoe, Fasten/unfasten left shoe Lower body dressing/undressing: 4: Steadying Assist        FIM - Control and instrumentation engineer Devices: Copy: 6: Supine > Sit: No assist, 5: Bed > Chair or W/C: Supervision (verbal cues/safety issues), 5: Chair or W/C > Bed: Supervision (verbal cues/safety issues)  FIM - Locomotion: Wheelchair Locomotion: Wheelchair: 0: Activity did not occur FIM - Locomotion: Ambulation  Locomotion: Ambulation Assistive Devices: Astronomer Ambulation/Gait Assistance: 4: Min guard Locomotion: Ambulation: 2: Travels 50 - 149 ft with minimal assistance (Pt.>75%) (without AD)  Comprehension Comprehension Mode: Auditory Comprehension: 5-Follows basic conversation/direction: With no assist  Expression Expression Mode: Verbal Expression: 5-Expresses basic needs/ideas: With no assist  Social Interaction Social Interaction: 7-Interacts appropriately with others - No medications needed.  Problem Solving Problem Solving Mode: Not assessed Problem Solving: 5-Solves basic 90% of the time/requires cueing < 10% of the time  Memory Memory: 7-Complete Independence: No helper  Medical Problem List and Plan: 1. Functional deficits secondary to debility due to PNA/CHF and multiple medical 2. DVT Prophylaxis/Anticoagulation: Pharmaceutical: Other (comment)-Eliquis resumed yesterday 3. Pain Management: N/A 4. Mood: LCSW to follow for evaluation and support.  5. Neuropsych: This patient is capable of making decisions on his own behalf. 6. Skin/Wound Care: routine pressure relief measures.  7. Fluids/Electrolytes/Nutrition: Monitor I/o. Continue nutritional supplements.  8. Chronic systolic CHF: Monitor for signs of overload. Check daily weights. On coreg, hydralazine, Imdur and demadex 9. Recurrent GIB: serial H/H. Will monitor for signs of recurrent bleeding.  10. Severe COPD: Respiratory status stable on budesonide and spiriva. Continue oxygen at nights.  11. CKD: Monitor with routine checks. Avoid nephrotoxic medications. No ACE/ARB due to CKD.  12. PAF: Monitor heart rate tid. Continue amiodarone and  13. DM type 2-controlled: Monitor BS with ac/hs checks. Continue lantus 10 untis at bedtime with SSI for tighter BS control.  14. HTN; Monitor every 8 hours. Continue Hydralazine, imdur, demadex and corege.  15. ABLA: H/H slowly improving. Up tl 8.5 16. Thrombocytopenia: Continue to monitor platelets as well as for  signs of bleeding.  17. Persistent Hypokalemia/hyponatremia:  daily supplement, follow up labs 18. RLL HCAP: Continues to have thick mucopurulent sputum production with cough--continue mucinex. Encourage IS. On augmentin LOS (Days) 3 A FACE TO FACE EVALUATION WAS PERFORMED  SWARTZ,ZACHARY T 10/16/2014 7:18 AM

## 2014-10-16 NOTE — Progress Notes (Signed)
Patient information reviewed and entered into eRehab system by Nili Honda, RN, CRRN, PPS Coordinator.  Information including medical coding and functional independence measure will be reviewed and updated through discharge.     Per nursing patient was given "Data Collection Information Summary for Patients in Inpatient Rehabilitation Facilities with attached "Privacy Act Statement-Health Care Records" upon admission.  

## 2014-10-16 NOTE — Progress Notes (Signed)
Occupational Therapy Session Note  Patient Details  Name: [name] MRN: [id number] Date of Birth: 1936-03-21  Today's Date: 10/16/2014 OT Individual Time: 4825-0037 OT Individual Time Calculation (min): 30 min    Short Term Goals: Week 1:  OT Short Term Goal 1 (Week 1): STGs= LTGs secondary to estimated short LOS  Skilled Therapeutic Interventions/Progress Updates:    Engaged in therapeutic exercises with focus on activity tolerance, BUE strengthening, and sit <> stand.  Pt ambulated to therapy gym with RW with supervision and negotiated a ramp and curb step with close supervision.  Completed 3 sets of 10 tricep pushups with focus on increased use of BUE.  Bicep curls and chest presses with 4# dowel rod 3 sets of 10 with pt requiring 1-2 minute rest break between each set.    Therapy Documentation Precautions:  Precautions Precautions: Fall Precaution Comments: Multiple falls pta. Restrictions Weight Bearing Restrictions: No Pain: Pain Assessment Pain Assessment: No/denies pain  See FIM for current functional status  Therapy/Group: Individual Therapy  Simonne Come 10/16/2014, 3:09 PM

## 2014-10-16 NOTE — Progress Notes (Addendum)
Advanced Heart Failure Rounding Note   Subjective:    Feels ok except for persistent hacking cough. + green sputum.  Hgb stable on apixaban 2.5 bid. No evidence of bleeding.   Weight stable Creatinine was up a few days ago and diuretics pulled back slightly. Renal function now stable. Denies dyspnea.   Objective:   Weight Range:  Vital Signs:   Temp:  [97.7 F (36.5 C)-98.7 F (37.1 C)] 98.7 F (37.1 C) (05/23 0806) Pulse Rate:  [62-74] 74 (05/23 0850) Resp:  [17-18] 18 (05/23 0806) BP: (78-125)/(43-54) 111/43 mmHg (05/23 0806) SpO2:  [92 %-100 %] 97 % (05/23 0850) Weight:  [66.5 kg (146 lb 9.7 oz)] 66.5 kg (146 lb 9.7 oz) (05/23 0530) Last BM Date: 10/13/14  Weight change: Filed Weights   10/14/14 0550 10/15/14 0500 10/16/14 0530  Weight: 64.5 kg (142 lb 3.2 oz) 66.2 kg (145 lb 15.1 oz) 66.5 kg (146 lb 9.7 oz)    Intake/Output:   Intake/Output Summary (Last 24 hours) at 10/16/14 1047 Last data filed at 10/16/14 0800  Gross per 24 hour  Intake    720 ml  Output   1250 ml  Net   -530 ml     Physical Exam: General:  No resp difficulty HEENT: normal Neck: supple. JVP ~9-10  . Carotids 2+ bilat; no bruits. No lymphadenopathy or thryomegaly appreciated. Cor: PMI nondisplaced. Regular rate & rhythm. No rubs, gallops or murmurs. Lungs: Decreased  Abdomen: soft, nontender, nondistended. No hepatosplenomegaly. No bruits or masses. Good bowel sounds. Extremities: no cyanosis, clubbing, rash,  R and LLE 1+ edema SCDs.  Neuro: alert & orientedx3, cranial nerves grossly intact. moves all 4 extremities w/o difficulty. Affect pleasant  Telemetry: Sinus Brady 50s.  Labs: Basic Metabolic Panel:  Recent Labs Lab 10/12/14 0305 10/13/14 0350 10/14/14 0527 10/15/14 0550 10/17/14 0625  NA 127* 129* 130* 128* 127*  K 4.0 3.3* 3.4* 3.6 3.7  CL 90* 88* 87* 86* 88*  CO2 29 31 34* 32 30  GLUCOSE 134* 95 118* 100* 98  BUN 35* 32* 31* 31* 33*  CREATININE 1.78* 1.73* 1.89* 2.01*  1.99*  CALCIUM 7.8* 8.1* 8.2* 7.9* 7.9*  MG 1.6* 1.8  --   --   --     Liver Function Tests:  Recent Labs Lab 10/09/14 2237 10/10/14 0529 10/11/14 0238 10/14/14 0527  AST 81* 75* 77* 52*  ALT 22 21 21 18   ALKPHOS 90 82 84 92  BILITOT 1.2 2.6* 1.4* 1.3*  PROT 5.8* 5.0* 5.3* 5.7*  ALBUMIN 2.4* 2.1* 2.1* 2.0*   No results for input(s): LIPASE, AMYLASE in the last 168 hours. No results for input(s): AMMONIA in the last 168 hours.  CBC:  Recent Labs Lab 10/09/14 2237 10/10/14 0529  10/14/14 0527 10/14/14 0756 10/14/14 1810 10/15/14 0745 10/15/14 1805 10/16/14 0856  WBC 9.8 12.6*  < > 8.4 8.5 7.2 7.0 7.5 7.2  NEUTROABS 6.5 9.1*  --  5.5  --   --   --   --   --   HGB 7.2* 8.1*  < > 9.0* 8.9* 8.2* 8.1* 8.5* 8.3*  HCT 20.8* 23.4*  < > 26.6* 27.8* 25.0* 24.6* 25.8* 25.4*  MCV 89.3 88.3  < > 92.0 91.7 91.6 91.8 91.8 92.0  PLT 125* 122*  < > 145* 148* 136* 137* 146* 149*  < > = values in this interval not displayed.  Cardiac Enzymes: No results for input(s): CKTOTAL, CKMB, CKMBINDEX, TROPONINI in the last 168 hours.  BNP: BNP (last 3 results)  Recent Labs  08/30/14 1639 09/09/14 1150 10/09/14 2237  BNP 800.7* 550.7* 715.8*    ProBNP (last 3 results)  Recent Labs  01/24/14 1909 02/09/14 0945  PROBNP 6721.0* 2555.0*      Other results:  Imaging: No results found.   Medications:     Scheduled Medications: . amiodarone  200 mg Oral Daily  . amoxicillin-clavulanate  1 tablet Oral BID  . apixaban  2.5 mg Oral BID  . budesonide (PULMICORT) nebulizer solution  0.25 mg Nebulization BID  . carvedilol  3.125 mg Oral BID WC  . folic acid  1 mg Oral Daily  . guaiFENesin  600 mg Oral BID  . hydrALAZINE  25 mg Oral TID  . insulin aspart  0-9 Units Subcutaneous TID WC  . insulin glargine  10 Units Subcutaneous QHS  . isosorbide mononitrate  30 mg Oral Daily  . levothyroxine  75 mcg Oral QAC breakfast  . pantoprazole  40 mg Oral Q0600  . potassium  chloride  20 mEq Oral BID  . pravastatin  80 mg Oral q1800  . saccharomyces boulardii  250 mg Oral BID  . thiamine  100 mg Oral Daily  . tiotropium  18 mcg Inhalation Daily  . torsemide  40 mg Oral Daily    Infusions:    PRN Medications: acetaminophen, alum & mag hydroxide-simeth, bisacodyl, guaiFENesin-dextromethorphan, ondansetron **OR** ondansetron (ZOFRAN) IV, ondansetron **OR** ondansetron (ZOFRAN) IV, senna-docusate, traZODone     Assessment:   1. Chronic Systolic HF- ICM- ECHO 0/07/90 EF 40-45%. Continue BB, hydralazine/nitrates and torsemide 2. PAF on chronic amiodarone and eliquis- restarted on Eliquis 2.5 BID 3. Anemia - GI w/u negative this admit including EGD and capsule 4. CKD- Creatinine baseline 1.8-2.0 . Creat 1.73 today. 5. Hyponatremia   Plan/Discussion:    He is stable from HF perspective. Hyponatremia is chronic for him (due to diuretics) and reasonable stable. Would continue current regimen.   No evidence of recurrent bleeding on apixaban.  Continue augmentin for PNA.    Appreciate CIR care.   Length of Stay: 3 Glori Bickers MD  10/16/2014, 10:47 AM  Advanced Heart Failure Team Pager [phone] (M-F; [place])  Please contact [facility] for night-coverage after hours (4p -7a ) and weekends on [email or web address]

## 2014-10-17 ENCOUNTER — Inpatient Hospital Stay (HOSPITAL_COMMUNITY): Payer: Medicare Other

## 2014-10-17 DIAGNOSIS — E871 Hypo-osmolality and hyponatremia: Secondary | ICD-10-CM

## 2014-10-17 DIAGNOSIS — I5022 Chronic systolic (congestive) heart failure: Secondary | ICD-10-CM

## 2014-10-17 LAB — BASIC METABOLIC PANEL
Anion gap: 9 (ref 5–15)
BUN: 33 mg/dL — ABNORMAL HIGH (ref 6–20)
CALCIUM: 7.9 mg/dL — AB (ref 8.9–10.3)
CO2: 30 mmol/L (ref 22–32)
Chloride: 88 mmol/L — ABNORMAL LOW (ref 101–111)
Creatinine, Ser: 1.99 mg/dL — ABNORMAL HIGH (ref 0.61–1.24)
GFR calc Af Amer: 36 mL/min — ABNORMAL LOW (ref >60)
GFR calc non Af Amer: 31 mL/min — ABNORMAL LOW (ref >60)
GLUCOSE: 98 mg/dL (ref 65–99)
Potassium: 3.7 mmol/L (ref 3.5–5.1)
Sodium: 127 mmol/L — ABNORMAL LOW (ref 135–145)

## 2014-10-17 LAB — GLUCOSE, CAPILLARY
GLUCOSE-CAPILLARY: 121 mg/dL — AB (ref 65–99)
Glucose-Capillary: 108 mg/dL — ABNORMAL HIGH (ref 65–99)
Glucose-Capillary: 93 mg/dL (ref 65–99)
Glucose-Capillary: 104 mg/dL — ABNORMAL HIGH (ref 65–99)

## 2014-10-17 NOTE — Progress Notes (Signed)
Occupational Therapy Session Note  Patient Details  Name: [name] MRN: [id number] Date of Birth: 01/29/1936  Today's Date: 10/17/2014 OT Individual Time: 0900-1000 OT Individual Time Calculation (min): 60 min    Short Term Goals: Week 1:  OT Short Term Goal 1 (Week 1): STGs= LTGs secondary to estimated short LOS  Skilled Therapeutic Interventions/Progress Updates:  ADL-retraining with focus on unassisted bathing/dressing, grooming and transfer skills reinforcement.   Pt receptive for early discharge d/t improved general health.  Pt was oriented to "graduation day" activities to verify his competence with all goals at modified independent level.   Pt accepted challenge and gathered clothing using rollator, ambulated to shower, transferred to shower chair, bathed, dressed, and returned to sink to groom while standing unassisted.   Pt performed transfers to/from bed and demo'd ability to monitor energy, resting as needed and using incentive spirometer to increase respiratory volume.   Pt ambulated to washing machine and placed laundry in washer with supervision to provide directions only.  After brief rest break, pt returned to his room and recovered to his recliner at end of session with all needs within reach.     Therapy Documentation Precautions:  Precautions Precautions: Fall Precaution Comments: Multiple falls pta. Restrictions Weight Bearing Restrictions: No  Pain: N/denies pain    See FIM for current functional status  Therapy/Group: Individual Therapy   Second session: Time: 1300-1400 Time Calculation (min):  60 min  Pain Assessment: No/denies pain  Skilled Therapeutic Interventions: Therapeutic activity with emphasis on dynamic standing balance, endurance, safety awareness and fall recovery training.   Pt completed functional mobility and activity of returning to laundry to collect clothing from dryer and fold clothes while seated to conserve energy with distant  supervision to provide directions only.    Pt was then escorted to ADL apartment and educated on fall recovery method to prevent injury to himself and his wife should he fall at home.   Pt recovered to floor with min assist for safety and recovered to love seat unassisted as per instructions w/o incident.   Pt returned to his room at end of session with all needs within reach and satisfied with his performance in therapeutic activities presented.   See FIM for current functional status  Therapy/Group: Individual Therapy  [place] 10/17/2014, 3:20 PM

## 2014-10-17 NOTE — Progress Notes (Signed)
Physical Therapy Session Note  Patient Details  Name: [name] MRN: [id number] Date of Birth: Jan 18, 1936  Today's Date: 10/17/2014 PT Individual Time: 1030-1135 PT Individual Time Calculation (min): 65 min   Short Term Goals: Week 1:  PT Short Term Goal 1 (Week 1): STGs=LTGs  Skilled Therapeutic Interventions/Progress Updates:   Focus of treatment this session on community ambulation.  Patient ambulated with rollator walker from unit out front entrance of hospital and down sloped incline on sidewalk, up/down 8 steps with right railing (both hands on railing) with supervision, then back around to [place] entrance and back up to pt's room total of about 1200' with multiple seated rest breaks on rollator walker at mod I level on level terrain, cues for using hand breaks on inclines and supervision for safety.  Patient educated on need for rest when feeling tired as he reported previous falls when he got weak trying to push himself.  Also educated on need for supervision for safety with stair negotiation.  Patient verbalized understanding.  Noted original recommendation for follow up PT was outpatient versus cardiac rehab.  Patient reports he would prefer to start with HHPT first; agree this is appropriate for his current status. Patient left in room with walker in reach and Mod I sign on door, nursing aware. Therapy Documentation Precautions:  Precautions Precautions: Fall Precaution Comments: Multiple falls pta. Restrictions Weight Bearing Restrictions: No General:   Vital Signs: Therapy Vitals Temp: 97.9 F (36.6 C) Temp Source: Oral Pulse Rate: 65 Resp: 20 BP: (!) 98/32 mmHg Patient Position (if appropriate): Sitting Oxygen Therapy SpO2: 100 % O2 Device: Not Delivered Pain: Pain Assessment Pain Assessment: No/denies pain Mobility:   Locomotion : Ambulation Ambulation/Gait Assistance: 6: Modified independent (Device/Increase time)   See FIM for current functional  status  Therapy/Group: Individual Therapy  [place], [place] 10/17/2014  10/17/2014, 4:30 PM

## 2014-10-17 NOTE — Plan of Care (Signed)
Problem: RH Floor Transfers Goal: LTG Patient will perform floor transfers w/assist (PT) LTG: Patient will perform floor transfers with assistance (PT).  Outcome: Not Met (add Reason) Patient with fragile skin and current skin tears on arms, felt risks of injury outweigh benefits of practicing floor transfers at this time.

## 2014-10-17 NOTE — Progress Notes (Signed)
[place] PHYSICAL MEDICINE & REHABILITATION     PROGRESS NOTE    Subjective/Complaints: Wants to be off oxygen at night. Uses at home but doesn't think he needs it. sats have been in 90's.   Objective: Vital Signs: Blood pressure 97/38, pulse 59, temperature 98.6 F (37 C), temperature source Oral, resp. rate 18, height 5\' 7"  (1.702 m), weight 65.6 kg (144 lb 10 oz), SpO2 97 %. No results found.  Recent Labs  10/16/14 0856 10/16/14 1829  WBC 7.2 8.4  HGB 8.3* 8.2*  HCT 25.4* 24.6*  PLT 149* 124*    Recent Labs  10/15/14 0550 10/17/14 0625  NA 128* 127*  K 3.6 3.7  CL 86* 88*  GLUCOSE 100* 98  BUN 31* 33*  CREATININE 2.01* 1.99*  CALCIUM 7.9* 7.9*   CBG (last 3)   Recent Labs  10/16/14 1644 10/16/14 2106 10/17/14 0702  GLUCAP 109* 145* 108*    Wt Readings from Last 3 Encounters:  10/17/14 65.6 kg (144 lb 10 oz)  10/13/14 65.545 kg (144 lb 8 oz)  09/27/14 68.856 kg (151 lb 12.8 oz)    Physical Exam:  Constitutional: He is oriented to person, place, and time. He appears well-developed.  HENT:  Head: Normocephalic and atraumatic.  Eyes: Conjunctivae are normal. Pupils are equal, round, and reactive to light.  Neck: Neck supple.  Cardiovascular: Normal rate and regular rhythm. no murmur Respiratory: Effort normal. No respiratory distress. He has decreased breath sounds at bases. He has wheezes insp and exp   GI: Soft. Bowel sounds are normal. He exhibits no distension. There is no tenderness.  Musculoskeletal: He exhibits edema (trace edema BUE with multiple ecchymotic areas.). He exhibits no tenderness.  Neurological: He is alert and oriented to person, place, and time.  Speech clear. Follows basic commands without difficulty. Decreased sensation R-foot. UE: 4/5 prox to distal. LE: 2/5hf, 3+ke and 3+ adf/paf. Cognitively appropriate with normal memory, insight, and awareness. Skin: Skin is warm and dry.  Psych: pleasant and  appropriate.  Assessment/Plan: 1. Functional deficits secondary to debility after PNA/CHF which require 3+ hours per day of interdisciplinary therapy in a comprehensive inpatient rehab setting. Physiatrist is providing close team supervision and 24 hour management of active medical problems listed below. Physiatrist and rehab team continue to assess barriers to discharge/monitor patient progress toward functional and medical goals. FIM: FIM - Bathing Bathing Steps Patient Completed: Chest, Right Arm, Left Arm, Abdomen, Front perineal area, Right upper leg, Buttocks, Left upper leg, Right lower leg (including foot), Left lower leg (including foot) Bathing: 5: Supervision: Safety issues/verbal cues  FIM - Upper Body Dressing/Undressing Upper body dressing/undressing steps patient completed: Thread/unthread right sleeve of pullover shirt/dresss, Thread/unthread left sleeve of pullover shirt/dress, Put head through opening of pull over shirt/dress, Pull shirt over trunk Upper body dressing/undressing: 4: Min-Patient completed 75 plus % of tasks FIM - Lower Body Dressing/Undressing Lower body dressing/undressing steps patient completed: Thread/unthread right pants leg, Thread/unthread left pants leg, Pull pants up/down, Thread/unthread right underwear leg, Thread/unthread left underwear leg, Pull underwear up/down, Don/Doff right shoe, Fasten/unfasten right shoe Lower body dressing/undressing: 4: Min-Patient completed 75 plus % of tasks  FIM - Toileting Toileting steps completed by patient: Adjust clothing prior to toileting, Performs perineal hygiene, Adjust clothing after toileting Toileting: 7: Independent: No helper, no device (per Melissa Ward, NT report)  FIM - Air cabin crew Transfers: 5-To toilet/BSC: Supervision (verbal cues/safety issues) (per Melissa Ward, NT report)  FIM - Bed/Chair Transfer Bed/Chair  Transfer Assistive Devices: Copy: 6: Bed > Chair or  W/C: No assist, 6: Chair or W/C > Bed: No assist  FIM - Locomotion: Wheelchair Locomotion: Wheelchair: 0: Activity did not occur FIM - Locomotion: Ambulation Locomotion: Ambulation Assistive Devices: Administrator Ambulation/Gait Assistance: 5: Supervision Locomotion: Ambulation: 5: Travels 150 ft or more with supervision/safety issues  Comprehension Comprehension Mode: Auditory Comprehension: 5-Understands complex 90% of the time/Cues < 10% of the time  Expression Expression Mode: Verbal Expression: 5-Expresses complex 90% of the time/cues < 10% of the time  Social Interaction Social Interaction: 7-Interacts appropriately with others - No medications needed.  Problem Solving Problem Solving Mode: Not assessed Problem Solving: 5-Solves basic 90% of the time/requires cueing < 10% of the time  Memory Memory: 5-Requires cues to use assistive device  Medical Problem List and Plan: 1. Functional deficits secondary to debility due to PNA/CHF and multiple medical 2. DVT Prophylaxis/Anticoagulation: Pharmaceutical: Other (comment)-Eliquis resumed yesterday 3. Pain Management: N/A 4. Mood: LCSW to follow for evaluation and support.  5. Neuropsych: This patient is capable of making decisions on his own behalf. 6. Skin/Wound Care: routine pressure relief measures.  7. Fluids/Electrolytes/Nutrition: Monitor I/o. Continue nutritional supplements.  8. Chronic systolic CHF: Monitor for signs of overload.   daily weights--stable around 65kg. On coreg, hydralazine, Imdur and demadex 9. Recurrent GIB: serial H/H. Will monitor for signs of recurrent bleeding.  10. Severe COPD: Respiratory status stable on budesonide and spiriva. Continue oxygen at nights.  11. CKD: Monitor with routine checks. Avoid nephrotoxic medications. No ACE/ARB due to CKD.  12. PAF: Monitor heart rate tid. Continue amiodarone 13. DM type 2-controlled: Monitor BS with ac/hs checks. Continue lantus 10 untis at  bedtime with SSI f  -good control at present.  14. HTN; Monitor every 8 hours. Continue Hydralazine, imdur, demadex and corege.  15. ABLA: H/H hovering around 8.2-8.5 16. Thrombocytopenia: Continue to monitor platelets as well as for signs of bleeding.  17. Persistent Hypokalemia/hyponatremia:  daily supplement, follow up na+ still only 127---likely diuretic induced----  -will ask cards if they want to make any changes to regimen   18. RLL HCAP: Continues to have thick mucopurulent sputum production with cough--continue mucinex. Encourage IS. On augmentin LOS (Days) 4 A FACE TO FACE EVALUATION WAS PERFORMED  SWARTZ,ZACHARY T 10/17/2014 7:51 AM

## 2014-10-17 NOTE — Progress Notes (Signed)
Social Work  Social Work Assessment and Plan  Patient Details  Name: [name] MRN: [id number] Date of Birth: 07-10-1935  Today's Date: 10/16/2014  Problem List:  Patient Active Problem List   Diagnosis Date Noted  . Physical deconditioning 10/13/2014  . Chronic atrial fibrillation   . Hospital acquired PNA   . Weakness   . Chronic respiratory failure with hypoxia   . Anemia 10/10/2014  . GI bleed 10/10/2014  . Diabetes mellitus type 2, controlled 10/10/2014  . Heme positive stool   . Anemia due to chronic blood loss   . Chronic anticoagulation   . OSA (obstructive sleep apnea)   . CAD in native artery   . Pulmonary hypertension   . Severe mitral regurgitation   . COPD exacerbation   . Obstructive sleep apnea   . Transaminitis   . Cardiomyopathy, ischemic   . Chronic systolic congestive heart failure   . Paroxysmal atrial fibrillation   . Acute on chronic renal failure   . Diabetes type 2, controlled   . Other specified hypothyroidism   . Esophageal candidiasis   . Normocytic anemia   . HCAP (healthcare-associated pneumonia)   . FUO (fever of unknown origin)   . Fever 09/09/2014  . Fatigue 09/09/2014  . Candida esophagitis 09/09/2014  . Chronic hyponatremia 09/09/2014  . Metabolic encephalopathy 03/19/8526  . Iron deficiency anemia due to chronic blood loss   . CKD (chronic kidney disease) stage 3, GFR 30-59 ml/min   . Mitral regurgitation 05/09/2014  . Elevated CEA 04/03/2014  . Type 2 diabetes mellitus, uncontrolled 03/13/2014  . Insomnia 03/13/2014  . Anorexia 02/12/2014  . Low back pain 02/12/2014  . Chronic systolic CHF (congestive heart failure), NYHA class 4 01/24/2014  . COPD, severe 01/02/2014  . Atrial flutter 12/12/2013  . Pleural plaque 08/31/2013  . Colonic stricture 12/22/2012  . Automatic implantable cardioverter-defibrillator in situ 04/17/2010  . Carotid artery disease 04/16/2010  . SEBORRHEIC DERMATITIS 05/30/2009  . ANXIETY  08/02/2007  . Coronary atherosclerosis 08/02/2007  . VENTRICULAR TACHYCARDIA 08/02/2007  . RENAL INSUFFICIENCY 08/02/2007  . DEGENERATIVE JOINT DISEASE 08/02/2007  . COLONIC POLYPS 03/24/2007  . HYPERCHOLESTEROLEMIA 03/24/2007  . Essential hypertension 03/24/2007  . PERIPHERAL VASCULAR DISEASE 03/24/2007   Past Medical History:  Past Medical History  Diagnosis Date  . VENTRICULAR TACHYCARDIA   . PERIPHERAL VASCULAR DISEASE   . HYPERTENSION     Hypertensive retinopathy-grade II OU  . HYPERCHOLESTEROLEMIA   . CORONARY ARTERY DISEASE     Hx of CABG  . Chronic systolic heart failure     Ischemic cardiomyopathy (01/2014 EF 40-45% with hypokinesis + small area of akinesis  . SEBORRHEIC DERMATITIS   . Chronic renal insufficiency, stage III (moderate)     CrCl 40s-50s  . DIVERTICULOSIS OF COLON   . COPD, severe   . COLONIC POLYPS   . ANXIETY   . ICD (implantable cardiac defibrillator) in place   . Type II diabetes mellitus     +background diabetic retinopathy OU  . Chronic lower back pain   . DJD (degenerative joint disease)     "hands" (05/24/2012)  . Osteoarthritis of finger   . History of atrial flutter   . Thrombus 12/2013    on ICD lead--started anticoag and his cardioversion for atrial flutter was postponed.  . Colon stricture 2015    with features worrisome for mass, also with recent rising CEA level (possible colon cancer)-GI MD= Dr. Franki Cabot recent eval/follow up with Dr. Earlean Shawl  was summer 2014, at which time he recommended colonoscopy but pt declined.  . Macular degeneration, age related, nonexudative     OU   Past Surgical History:  Past Surgical History  Procedure Laterality Date  . Coronary artery bypass graft  1990's    CABG X4  . Tonsillectomy  ?1942  . Cardiac defibrillator placement  12/2004    single chamber defibrillator- Medtronic Maxima 8/06 DrKlein [Other][  . Tee without cardioversion N/A 12/22/2013    Procedure: TRANSESOPHAGEAL ECHOCARDIOGRAM  (TEE);  Surgeon: Thayer Headings, MD;  Location: [place];  Service: Cardiovascular;  Laterality: N/A;  . Cardioversion N/A 12/22/2013    Procedure: CARDIOVERSION;  Surgeon: Thayer Headings, MD;  Location: [facility] ENDOSCOPY;  Service: Cardiovascular;  Laterality: N/A;  . Cardiovascular stress test  01/12/13    myocard perf imaging: previous infarct with a moderately reduced EF as was known previously.  No new findings.   . Gastric emptying scan  02/02/14    Normal  . Transthoracic echocardiogram  01/25/14; 04/2014    EF 40-45%, diffuse hypokinesis, infero-basilar myocardial akinesis, PA pressure slightly high, valves fine.04/2014-Echo today EF ~40% inferior AK moderate to severe MR  . Abdominal ultrasound  01/2014    GB sludge, o/w normal  . Ct virtual colonoscopy diagnostic  11/2012    Asymmetric thickening of the rectosigmoid colon worrisome for  . Cardiovascular stress test      Lexiscan: There is a medium sized fixed inferior wall defect of moderate severity involving the apical inferior and mid inferior and basal inferoseptal segments, consistent with old inferior MI. No significant reversible ischemia.  EF 34%, inferior wall hypokinesis--intermediate risk scan (ok to do planned procedure)  . Implantable cardioverter defibrillator revision N/A 05/25/2012    Procedure: IMPLANTABLE CARDIOVERTER DEFIBRILLATOR REVISION;  Surgeon: Evans Lance, MD;  Location: [facility] CATH LAB;  Service: Cardiovascular;  Laterality: N/A;  . Right heart catheterization N/A 08/31/2014    Procedure: RIGHT HEART CATH;  Surgeon: Larey Dresser, MD;  Location: [facility] CATH LAB;  Service: Cardiovascular;  Laterality: N/A;  . Esophagogastroduodenoscopy N/A 09/03/2014    Procedure: ESOPHAGOGASTRODUODENOSCOPY (EGD);  Surgeon: Carol Ada, MD;  Location: [facility] ENDOSCOPY;  Service: Endoscopy;  Laterality: N/A;  . Givens capsule study N/A 10/12/2014    Procedure: GIVENS CAPSULE STUDY;  Surgeon: Irene Shipper, MD;  Location: [place];   Service: Endoscopy;  Laterality: N/A;   Social History:  reports that he quit smoking about 14 years ago. His smoking use included Cigarettes. He has a 100 pack-year smoking history. He has never used smokeless tobacco. He reports that he drinks alcohol. He reports that he does not use illicit drugs.  Family / Support Systems Marital Status: Married Patient Roles: Spouse, Parent, Other (Comment) (grandparent) Spouse/Significant Other: wife, Cooper Moroney, (H) [phone] or ([phone] Children: daughter, Parke Simmers @ [phone] (local) and Anderson Malta  Anticipated Caregiver: wife (and two dtrs are local and supportive as well) Ability/Limitations of Caregiver: no limitations Caregiver Availability: 24/7 Family Dynamics: Wife very supportive and encouraging to pt during assessment interview.  No concerns noted.  Social History Preferred language: English Religion: Catholic Cultural Background: NA Education: college Read: Yes Write: Yes Employment Status: Retired Freight forwarder Issues: None Guardian/Conservator: None - per MD, pt capable of making decisions on his own behalf   Abuse/Neglect Physical Abuse: Denies Verbal Abuse: Denies Sexual Abuse: Denies Exploitation of patient/patient's resources: Denies Self-Neglect: Denies  Emotional Status [name] affect, behavior adn adjustment status: Pt very pleasant,  talkative and denies any concerns or emotional distress. No s/s of depression - no formal screen indicated.  Will monitor. Recent Psychosocial Issues: None Pyschiatric History: None Substance Abuse History: None  Patient / Family Perceptions, Expectations & Goals Pt/Family understanding of illness & functional limitations: Pt and wife with good understanding of medical issues and level of deconditioning as a result/ need for CIR.   Premorbid pt/family roles/activities: Pt independent overall.  Using rollator at times. Anticipated changes in  roles/activities/participation: Little to no change as wife was providing support as needed prior and pt with mod i goals Pt/family expectations/goals: "I just hope to be home by Thursday."  [employer]: None Premorbid Home Care/DME Agencies: None Transportation available at discharge: yes  Discharge Planning Living Arrangements: Spouse/significant other Support Systems: Spouse/significant other, Children, Friends/neighbors Type of Residence: Private residence Insurance underwriter Resources: [employer], Multimedia programmer (specify) Web designer) Financial Resources: [place] Referred: No Living Expenses: Own Money Management: Patient Does the patient have any problems obtaining your medications?: No Home Management: pt and wife share Patient/Family Preliminary Plans: pt to d/c home with wife providing any needed assist Social Work Anticipated Follow Up Needs: HH/OP Expected length of stay: 5-7 days  Clinical Impression Very pleasant gentleman here for deconditioning following PNA/ CHF.  Excellent support at home from wife.  Mod i goals set and anticipating short LOS.  Will follow for any d/c planning needs and support.  Inocencio Roy 10/16/2014, 10:53 AM

## 2014-10-18 DIAGNOSIS — D62 Acute posthemorrhagic anemia: Secondary | ICD-10-CM

## 2014-10-18 LAB — CBC
HEMATOCRIT: 22.7 % — AB (ref 39.0–52.0)
Hemoglobin: 7.4 g/dL — ABNORMAL LOW (ref 13.0–17.0)
MCH: 29.4 pg (ref 26.0–34.0)
MCHC: 32.6 g/dL (ref 30.0–36.0)
MCV: 90.1 fL (ref 78.0–100.0)
PLATELETS: 125 10*3/uL — AB (ref 150–400)
RBC: 2.52 MIL/uL — ABNORMAL LOW (ref 4.22–5.81)
RDW: 16.5 % — AB (ref 11.5–15.5)
WBC: 6.4 10*3/uL (ref 4.0–10.5)

## 2014-10-18 LAB — BASIC METABOLIC PANEL
ANION GAP: 9 (ref 5–15)
BUN: 34 mg/dL — AB (ref 6–20)
CALCIUM: 7.9 mg/dL — AB (ref 8.9–10.3)
CO2: 28 mmol/L (ref 22–32)
CREATININE: 1.81 mg/dL — AB (ref 0.61–1.24)
Chloride: 88 mmol/L — ABNORMAL LOW (ref 101–111)
GFR calc Af Amer: 40 mL/min — ABNORMAL LOW (ref >60)
GFR calc non Af Amer: 34 mL/min — ABNORMAL LOW (ref >60)
Glucose, Bld: 102 mg/dL — ABNORMAL HIGH (ref 65–99)
Potassium: 3.6 mmol/L (ref 3.5–5.1)
Sodium: 125 mmol/L — ABNORMAL LOW (ref 135–145)

## 2014-10-18 LAB — GLUCOSE, CAPILLARY
GLUCOSE-CAPILLARY: 107 mg/dL — AB (ref 65–99)
Glucose-Capillary: 128 mg/dL — ABNORMAL HIGH (ref 65–99)

## 2014-10-18 LAB — OCCULT BLOOD X 1 CARD TO LAB, STOOL: FECAL OCCULT BLD: POSITIVE — AB

## 2014-10-18 LAB — HEMOGLOBIN AND HEMATOCRIT, BLOOD
HEMATOCRIT: 24.1 % — AB (ref 39.0–52.0)
Hemoglobin: 8.1 g/dL — ABNORMAL LOW (ref 13.0–17.0)

## 2014-10-18 MED ORDER — ISOSORBIDE MONONITRATE ER 30 MG PO TB24: 30.0000 mg | ORAL_TABLET | Freq: Every day | ORAL | Status: DC

## 2014-10-18 MED ORDER — SENNOSIDES-DOCUSATE SODIUM 8.6-50 MG PO TABS: 1.0000 | ORAL_TABLET | Freq: Every evening | ORAL | Status: DC | PRN

## 2014-10-18 MED ORDER — FOLIC ACID 1 MG PO TABS: 1.0000 mg | ORAL_TABLET | Freq: Every day | ORAL | Status: DC

## 2014-10-18 MED ORDER — POTASSIUM CHLORIDE CRYS ER 20 MEQ PO TBCR: 20.0000 meq | EXTENDED_RELEASE_TABLET | Freq: Two times a day (BID) | ORAL | Status: DC

## 2014-10-18 MED ORDER — AMIODARONE HCL 200 MG PO TABS: 200.0000 mg | ORAL_TABLET | Freq: Every day | ORAL | Status: DC

## 2014-10-18 MED ORDER — APIXABAN 2.5 MG PO TABS: 2.5000 mg | ORAL_TABLET | Freq: Two times a day (BID) | ORAL | Status: DC

## 2014-10-18 MED ORDER — PRAVASTATIN SODIUM 80 MG PO TABS: 80.0000 mg | ORAL_TABLET | Freq: Every day | ORAL | Status: DC

## 2014-10-18 MED ORDER — POTASSIUM CHLORIDE CRYS ER 20 MEQ PO TBCR: 20.0000 meq | EXTENDED_RELEASE_TABLET | Freq: Every day | ORAL | Status: DC

## 2014-10-18 MED ORDER — CARVEDILOL 3.125 MG PO TABS: 3.1250 mg | ORAL_TABLET | Freq: Two times a day (BID) | ORAL | Status: DC

## 2014-10-18 MED ORDER — HYDRALAZINE HCL 25 MG PO TABS: 25.0000 mg | ORAL_TABLET | Freq: Three times a day (TID) | ORAL | Status: DC

## 2014-10-18 MED ORDER — TIOTROPIUM BROMIDE MONOHYDRATE 18 MCG IN CAPS: ORAL_CAPSULE | RESPIRATORY_TRACT | Status: DC

## 2014-10-18 MED ORDER — INSULIN LISPRO 100 UNIT/ML (KWIKPEN): PEN_INJECTOR | SUBCUTANEOUS | Status: AC

## 2014-10-18 MED ORDER — TORSEMIDE 20 MG PO TABS: 40.0000 mg | ORAL_TABLET | Freq: Every day | ORAL | Status: DC

## 2014-10-18 MED ORDER — PANTOPRAZOLE SODIUM 40 MG PO TBEC: 40.0000 mg | DELAYED_RELEASE_TABLET | Freq: Every day | ORAL | Status: AC

## 2014-10-18 NOTE — Progress Notes (Signed)
[facility] PHYSICAL MEDICINE & REHABILITATION     PROGRESS NOTE    Subjective/Complaints: No complaints. Cough generally better. Feels ready to go home  Objective: Vital Signs: Blood pressure 99/36, pulse 54, temperature 98 F (36.7 C), temperature source Oral, resp. rate 54, height 5\' 7"  (1.702 m), weight 68 kg (149 lb 14.6 oz), SpO2 99 %. No results found.  Recent Labs  10/16/14 1829 10/18/14 0530  WBC 8.4 6.4  HGB 8.2* 7.4*  HCT 24.6* 22.7*  PLT 124* 125*    Recent Labs  10/17/14 0625 10/18/14 0530  NA 127* 125*  K 3.7 3.6  CL 88* 88*  GLUCOSE 98 102*  BUN 33* 34*  CREATININE 1.99* 1.81*  CALCIUM 7.9* 7.9*   CBG (last 3)   Recent Labs  10/17/14 1629 10/17/14 2045 10/18/14 0629  GLUCAP 104* 121* 107*    Wt Readings from Last 3 Encounters:  10/18/14 68 kg (149 lb 14.6 oz)  10/13/14 65.545 kg (144 lb 8 oz)  09/27/14 68.856 kg (151 lb 12.8 oz)    Physical Exam:  Constitutional: He is oriented to person, place, and time. He appears well-developed.  HENT:  Head: Normocephalic and atraumatic.  Eyes: Conjunctivae are normal. Pupils are equal, round, and reactive to light.  Neck: Neck supple.  Cardiovascular: Normal rate and regular rhythm. no murmur Respiratory: Effort normal. No respiratory distress. He has decreased breath sounds at bases. He has wheezes insp and exp   GI: Soft. Bowel sounds are normal. He exhibits no distension. There is no tenderness.  Musculoskeletal: He exhibits edema (trace edema BUE with multiple ecchymotic areas.). He exhibits no tenderness.  Neurological: He is alert and oriented to person, place, and time.  Speech clear. Follows basic commands without difficulty. Decreased sensation R-foot. UE: 4/5 prox to distal. LE: 2/5hf, 3+ke and 3+ adf/paf. Cognitively appropriate with normal memory, insight, and awareness. Skin: Skin is warm and dry.  Psych: pleasant and appropriate.  Assessment/Plan: 1. Functional deficits  secondary to debility after PNA/CHF which require 3+ hours per day of interdisciplinary therapy in a comprehensive inpatient rehab setting. Physiatrist is providing close team supervision and 24 hour management of active medical problems listed below. Physiatrist and rehab team continue to assess barriers to discharge/monitor patient progress toward functional and medical goals. FIM: FIM - Bathing Bathing Steps Patient Completed: Chest, Right Arm, Left Arm, Abdomen, Front perineal area, Right upper leg, Buttocks, Left upper leg, Right lower leg (including foot), Left lower leg (including foot) Bathing: 6: More than reasonable amount of time  FIM - Upper Body Dressing/Undressing Upper body dressing/undressing steps patient completed: Thread/unthread right sleeve of pullover shirt/dresss, Thread/unthread left sleeve of pullover shirt/dress, Put head through opening of pull over shirt/dress, Pull shirt over trunk Upper body dressing/undressing: 7: Complete Independence: No helper FIM - Lower Body Dressing/Undressing Lower body dressing/undressing steps patient completed: Thread/unthread right underwear leg, Thread/unthread left underwear leg, Pull underwear up/down, Thread/unthread right pants leg, Thread/unthread left pants leg, Pull pants up/down, Fasten/unfasten pants, Don/Doff right sock, Don/Doff left sock, Don/Doff right shoe, Don/Doff left shoe, Fasten/unfasten right shoe, Fasten/unfasten left shoe Lower body dressing/undressing: 6: More than reasonable amount of time  FIM - Toileting Toileting steps completed by patient: Adjust clothing prior to toileting, Performs perineal hygiene, Adjust clothing after toileting Toileting Assistive Devices: Grab bar or rail for support Toileting: 6: Assistive device: No helper  FIM - Radio producer Devices: Grab bars, Insurance account manager Transfers: 6-To toilet/ BSC, 6-From toilet/BSC  FIM -  Bed/Chair Surveyor, minerals Devices: Copy: 6: Supine > Sit: No assist, 6: Sit > Supine: No assist, 6: Bed > Chair or W/C: No assist, 6: Chair or W/C > Bed: No assist  FIM - Locomotion: Wheelchair Locomotion: Wheelchair: 0: Activity did not occur FIM - Locomotion: Ambulation Locomotion: Ambulation Assistive Devices: Administrator Ambulation/Gait Assistance: 6: Modified independent (Device/Increase time) Locomotion: Ambulation: 6: Travels 150 ft or more with assistive device/no helper  Comprehension Comprehension Mode: Auditory Comprehension: 7-Follows complex conversation/direction: With no assist  Expression Expression Mode: Verbal Expression: 7-Expresses complex ideas: With no assist  Social Interaction Social Interaction: 7-Interacts appropriately with others - No medications needed.  Problem Solving Problem Solving Mode: Not assessed Problem Solving: 7-Solves complex problems: Recognizes & self-corrects  Memory Memory: 7-Complete Independence: No helper  Medical Problem List and Plan: 1. Functional deficits secondary to debility due to PNA/CHF and multiple medical 2. DVT Prophylaxis/Anticoagulation: Pharmaceutical: Other (comment)-Eliquis resumed yesterday 3. Pain Management: N/A 4. Mood: LCSW to follow for evaluation and support.  5. Neuropsych: This patient is capable of making decisions on his own behalf. 6. Skin/Wound Care: routine pressure relief measures.  7. Fluids/Electrolytes/Nutrition: Monitor I/o. Continue nutritional supplements.  8. Chronic systolic CHF: Monitor for signs of overload.   daily weights--stable around 65kg. On coreg, hydralazine, Imdur and demadex 9. Recurrent GIB: serial H/H. Will monitor for signs of recurrent bleeding.  10. Severe COPD: Respiratory status stable on budesonide and spiriva. Continue oxygen at nights.  11. CKD: Monitor with routine checks. Avoid nephrotoxic medications. No ACE/ARB due to CKD.  12. PAF: Monitor heart  rate tid. Continue amiodarone 13. DM type 2-controlled: Monitor BS with ac/hs checks. Continue lantus 10 untis at bedtime with SSI   -good control at present.  14. HTN; Monitor every 8 hours. Continue Hydralazine, imdur, demadex and corege.  15. ABLA: H/H down to 7.4 today----re-check this morning 16. Thrombocytopenia: Continue to monitor platelets as well as for signs of bleeding.  17. Persistent Hypokalemia/hyponatremia:  daily supplement, follow up na+ down to 125 this morning--  -continue same regimen per cards    -needs serial BMET's at home 18. RLL HCAP: Continues to have thick mucopurulent sputum production with cough--continue mucinex. Encourage IS. On augmentin LOS (Days) 5 A FACE TO FACE EVALUATION WAS PERFORMED  Maddox Hlavaty T 10/18/2014 7:24 AM

## 2014-10-18 NOTE — Progress Notes (Signed)
Social Work Patient ID: MUTASIM TUCKEY, male   DOB: February 23, 1936, 79 y.o.   MRN: [id number]   Lowella Curb, LCSW Social Worker Signed  Patient Care Conference 10/18/2014  9:14 AM    Expand All Collapse All   Inpatient RehabilitationTeam Conference and Plan of Care Update Date: 10/17/2014   Time: 2:05 PM     Patient Name: [name]       Medical Record Number: [id number]  Date of Birth: [birth year] Sex: Male         Room/Bed: 4M05C/4M05C-01 Payor Info: Payor: MEDICARE / Plan: MEDICARE PART A AND B / Product Type: *No Product type* /    Admitting Diagnosis: debility   Admit Date/Time:  10/13/2014  3:48 PM Admission Comments: No comment available   Primary Diagnosis:  Physical deconditioning Principal Problem: Physical deconditioning    Patient Active Problem List     Diagnosis  Date Noted   .  Physical deconditioning  10/13/2014   .  Chronic atrial fibrillation     .  Hospital acquired PNA     .  Weakness     .  Chronic respiratory failure with hypoxia     .  Anemia  10/10/2014   .  GI bleed  10/10/2014   .  Diabetes mellitus type 2, controlled  10/10/2014   .  Heme positive stool     .  Anemia due to chronic blood loss     .  Chronic anticoagulation     .  OSA (obstructive sleep apnea)     .  CAD in native artery     .  Pulmonary hypertension     .  Severe mitral regurgitation     .  COPD exacerbation     .  Obstructive sleep apnea     .  Transaminitis     .  Cardiomyopathy, ischemic     .  Chronic systolic congestive heart failure     .  Paroxysmal atrial fibrillation     .  Acute on chronic renal failure     .  Diabetes type 2, controlled     .  Other specified hypothyroidism     .  Esophageal candidiasis     .  Normocytic anemia     .  HCAP (healthcare-associated pneumonia)     .  FUO (fever of unknown origin)     .  Fever  09/09/2014   .  Fatigue  09/09/2014   .  Candida esophagitis  09/09/2014   .  Chronic hyponatremia  09/09/2014   .  Metabolic  encephalopathy  09/09/2014   .  Iron deficiency anemia due to chronic blood loss     .  CKD (chronic kidney disease) stage 3, GFR 30-59 ml/min     .  Mitral regurgitation  05/09/2014   .  Elevated CEA  04/03/2014   .  Type 2 diabetes mellitus, uncontrolled  03/13/2014   .  Insomnia  03/13/2014   .  Anorexia  02/12/2014   .  Low back pain  02/12/2014   .  Chronic systolic CHF (congestive heart failure), NYHA class 4  01/24/2014   .  COPD, severe  01/02/2014   .  Atrial flutter  12/12/2013   .  Pleural plaque  08/31/2013   .  Colonic stricture  12/22/2012   .  Automatic implantable cardioverter-defibrillator in situ  04/17/2010   .  Carotid artery disease  04/16/2010   .  SEBORRHEIC DERMATITIS  05/30/2009   .  ANXIETY  08/02/2007   .  Coronary atherosclerosis  08/02/2007   .  VENTRICULAR TACHYCARDIA  08/02/2007   .  RENAL INSUFFICIENCY  08/02/2007   .  DEGENERATIVE JOINT DISEASE  08/02/2007   .  COLONIC POLYPS  03/24/2007   .  HYPERCHOLESTEROLEMIA  03/24/2007   .  Essential hypertension  03/24/2007   .  PERIPHERAL VASCULAR DISEASE  03/24/2007     Expected Discharge Date: Expected Discharge Date: 10/18/14  Team Members Present: Physician leading conference: Dr. Alger Simons Social Worker Present: Lennart Pall, LCSW Nurse Present: Heather Roberts, RN PT Present: Canary Brim, PT OT Present: Salome Spotted, OT;Other (comment) (Amy Bobby Rumpf, OT) SLP Present: Weston Anna, SLP PPS Coordinator present : Daiva Nakayama, RN, CRRN        Current Status/Progress  Goal  Weekly Team Focus   Medical     chf, pneumonia and deconditionng  improve activity tolerance  stabilze medically for dc   Bowel/Bladder     cont of B+B; no BM since 5/21  regular elimination pattern  assessneed for laxative prn   Swallow/Nutrition/ Hydration               ADL's     mod I overall  mod I overall  d/c planning, activity tolerance, standing balance, continuing education   Mobility     S to mod I overall   mod I transfers and gait; S car and stairs   d/c planning, endurance, functional strengthening, dynamic balance, gait and stairs   Communication               Safety/Cognition/ Behavioral Observations              Pain     n/a         Skin     multiple abrasions/skin tears on arms   no further skin breakdown  assess skin daily; remind pt of safety during activities    Rehab Goals Patient on target to meet rehab goals: Yes *See Care Plan and progress notes for long and short-term goals.    Barriers to Discharge:  baseline pulmonary issues, stamina      Possible Resolutions to Barriers:   pacing, ?oxygen at home, follow up at home      Discharge Planning/Teaching Needs:   home with wife able to provide any needed assistance       Team Discussion:    Excellent gains and reaching mod i goals.  Team feels is ready for d/c tomorrow and recommend OPPT follow up.   Revisions to Treatment Plan:    None    Continued Need for Acute Rehabilitation Level of Care: The patient requires daily medical management by a physician with specialized training in physical medicine and rehabilitation for the following conditions: Daily direction of a multidisciplinary physical rehabilitation program to ensure safe treatment while eliciting the highest outcome that is of practical value to the patient.: Yes Daily medical management of patient stability for increased activity during participation in an intensive rehabilitation regime.: Yes Daily analysis of laboratory values and/or radiology reports with any subsequent need for medication adjustment of medical intervention for : Cardiac problems;Pulmonary problems;Other  Calea Hribar, [place] 10/18/2014, 11:17 AM

## 2014-10-18 NOTE — Discharge Summary (Signed)
Physician Discharge Summary  Patient ID: [name] MRN: [id number] DOB/AGE: 12-18-1935 79 y.o.  Admit date: 10/13/2014 Discharge date: 10/18/2014  Discharge Diagnoses:  Principal Problem:   Physical deconditioning Active Problems:   Chronic systolic CHF (congestive heart failure), NYHA class 4   Type 2 diabetes mellitus, uncontrolled   CKD (chronic kidney disease) stage 3, GFR 30-59 ml/min   HCAP (healthcare-associated pneumonia)   Cardiomyopathy, ischemic   GI bleed   Chronic atrial fibrillation   Hospital acquired PNA   Discharged Condition: Stable   Significant Diagnostic Studies: Dg Chest 2 View  10/14/2014   CLINICAL DATA:  Hospital acquired pneumonia.  Ex-smoker.  EXAM: CHEST  2 VIEW  COMPARISON:  10/09/2014.  FINDINGS: Stable enlarged cardiac silhouette, post CABG changes and left subclavian and AICD leads. Stable prominence of the interstitial markings and calcified pleural plaques. Small amount of bilateral pleural thickening or fluid. Interval mild patchy opacity in the right mid lung zone. The hemidiaphragms remain flattened.  IMPRESSION: 1. Interval mild patchy opacity suspicious for pneumonia in the right mid lung zone. 2. Stable changes of COPD with interstitial fibrosis. 3. 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   Electronically Signed   By: Claudie Revering M.D.   On: 10/14/2014 10:29   Dg Chest 2 View  10/09/2014   CLINICAL DATA:  Weakness, shortness of breath.  History of COPD.  EXAM: CHEST  2 VIEW  COMPARISON:  09/11/2014  FINDINGS: Lungs are hyperinflated with emphysema. There are bilateral calcified pleural plaques. Dual lead left-sided pacemaker remains in place. Patient is post median sternotomy. Stable cardiomegaly. No pulmonary edema. Improved right upper lobe opacity compared to prior. Increased opacity at the right costophrenic angle may reflect new focal consolidation, atelectasis, or less likely pleural effusion. No left  pleural effusion. No pneumothorax. No acute osseous abnormalities are seen.  IMPRESSION: Increasing opacity at the right costophrenic angle that is new/ progressed from prior exams and may reflect atelectasis, pneumonia or less likely pleural effusion.  Underlying chronic lung disease, bilateral pleural plaques and emphysema. Stable cardiomegaly.   Electronically Signed   By: Jeb Levering M.D.   On: 10/09/2014 23:23     Labs:  Basic Metabolic Panel:  Recent Labs Lab 10/12/14 0305 10/13/14 0350 10/14/14 0527 10/15/14 0550 10/17/14 0625 10/18/14 0530  NA 127* 129* 130* 128* 127* 125*  K 4.0 3.3* 3.4* 3.6 3.7 3.6  CL 90* 88* 87* 86* 88* 88*  CO2 29 31 34* 32 30 28  GLUCOSE 134* 95 118* 100* 98 102*  BUN 35* 32* 31* 31* 33* 34*  CREATININE 1.78* 1.73* 1.89* 2.01* 1.99* 1.81*  CALCIUM 7.8* 8.1* 8.2* 7.9* 7.9* 7.9*  MG 1.6* 1.8  --   --   --   --     CBC:  Recent Labs Lab 10/14/14 0527  10/16/14 0856 10/16/14 1829 10/18/14 0530 10/18/14 0904  WBC 8.4  < > 7.2 8.4 6.4  --   NEUTROABS 5.5  --   --   --   --   --   HGB 9.0*  < > 8.3* 8.2* 7.4* 8.1*  HCT 26.6*  < > 25.4* 24.6* 22.7* 24.1*  MCV 92.0  < > 92.0 89.8 90.1  --   PLT 145*  < > 149* 124* 125*  --   < > = values in this interval not displayed.  CBG:  Recent Labs Lab 10/17/14 1149 10/17/14 1629 10/17/14 2045 10/18/14 0629 10/18/14 1130  GLUCAP 93 104*  121* 107* 128*    Brief HPI:   [name] is a 79 y.o. male with history of CAD, VT/ICD, DM type 2, COPD-oxygen HS, chronic systolic CHF-milrinone, recent admission 08/2014 for GIB, candida esophagitis and PNA who was readmitted 10/09/14 with worsening of weakness, increasing DOE and falls. CT head without acute changes. Patient was noted to be anemic with Hgb 7.2, positive FOBT and CXR showed evidence of volume overload and PNA. He was transfused with 1 units PRBC and started on IV anbiotics for PNA. Dr. Haroldine Laws consulted for input on chronic AC held  and diuretics resumed to help with fluid overload. Dr. Henrene Pastor c recommended capsule endoscopy for work up with transfusion as indicated and resume AC if H/H stable. Swallow evaluation done and negative for signs of dysphagia. Eliquis resumed as no signs of active bleeding noted and capsule endoscopy done on 05/19. Therapy ongoing and CIR recommended for follow  Up therapy.     Hospital Course: [name] was admitted to rehab 10/13/2014 for inpatient therapies to consist of PT and OT at least three hours five days a week. Past admission physiatrist, therapy team and rehab RN have worked together to provide customized collaborative inpatient rehab. He was maintained on Augmentin for treatment of HCAP and completed his antibiotic course at discharge.  Respiratory status is stable with decrease in sputum production.  Diabetes has been monitored on ac/hs basis and blood sugars have been well controlled. Po intake has been good. He was monitored closelyf for signs of overload and weights have been on downward trend. He is to continue to use TEDs as well as elevation to help control peripheral edema. CBC has been monitored daily and has been relatively stable in 8.2- 8.6 range since transfusion.   Hyponatremia has been monitored closely and showed drop to 125 at discharge. Cardiology felt chronic hyponatremia was related to CHF and demadex was increased back to 40 mg daily as renal status was improving.  He has been scheduled for follow up with PMD with recommendations to recheck of lytes as well as CBC on 05/31. Patient was also advised to maintain 1500 cc fluid restriction daily. He is to continue to monitor daily weights.  He has made good progress during his rehab stay and is independent at discharge. He will continue to receive outpatient therapy at [facility] beginning 06/02.   Rehab course: During patient's stay in rehab weekly team conferences were held to monitor patient's progress, set goals and  discuss barriers to discharge. At admission, patient required min assist with self care tasks and mobility. He has had improvement in activity tolerance, balance, postural control, as well as ability to compensate for deficits. He is able to complete ADL tasks independently. He is ambulating 1200' with use of rollater independently. He requires supervision to navigate 8 stairs.    Disposition:  Home   Diet: Heart Healthy/ Carb Modified/ Low salt. 1500 cc fluid/day  Special Instructions: 1. Check weights daily.  2. Contact MD if you notice black/maroon stools or rectal bleeding.     Medication List    STOP taking these medications        amoxicillin-clavulanate 500-125 MG per tablet  Commonly known as:  AUGMENTIN     chlorhexidine 0.12 % solution  Commonly known as:  PERIDEX     metolazone 2.5 MG tablet  Commonly known as:  ZAROXOLYN     tobramycin 0.3 % ophthalmic solution  Commonly known as:  TOBREX  TAKE these medications        amiodarone 200 MG tablet  Commonly known as:  PACERONE  Take 1 tablet (200 mg total) by mouth daily.     apixaban 2.5 MG Tabs tablet  Commonly known as:  ELIQUIS  Take 1 tablet (2.5 mg total) by mouth 2 (two) times daily.     carvedilol 3.125 MG tablet  Commonly known as:  COREG  Take 1 tablet (3.125 mg total) by mouth 2 (two) times daily with a meal.     fexofenadine 180 MG tablet  Commonly known as:  ALLEGRA  Take 180 mg by mouth daily as needed for allergies or rhinitis.     Fluticasone-Salmeterol 500-50 MCG/DOSE Aepb  Commonly known as:  ADVAIR  Inhale 1 puff into the lungs 2 (two) times daily.     folic acid 1 MG tablet  Commonly known as:  FOLVITE  Take 1 tablet (1 mg total) by mouth daily.     guaiFENesin 600 MG 12 hr tablet  Commonly known as:  MUCINEX  Take 1 tablet (600 mg total) by mouth 2 (two) times daily.     hydrALAZINE 25 MG tablet  Commonly known as:  APRESOLINE  Take 1 tablet (25 mg total) by mouth 3 (three)  times daily.     Insulin Glargine 100 UNIT/ML Solostar Pen  Commonly known as:  LANTUS SOLOSTAR  10 units SQ qhs     insulin lispro 100 UNIT/ML KiwkPen  Commonly known as:  HUMALOG KWIKPEN  Take 5 units in the morning, 5 units at noon, --if blood sugar over 100 and you eat > 50% of meals     isosorbide mononitrate 30 MG 24 hr tablet  Commonly known as:  IMDUR  Take 1 tablet (30 mg total) by mouth daily.     levothyroxine 75 MCG tablet  Commonly known as:  SYNTHROID, LEVOTHROID  Take 1 tablet (75 mcg total) by mouth daily.     pantoprazole 40 MG tablet  Commonly known as:  PROTONIX  Take 1 tablet (40 mg total) by mouth daily.     potassium chloride SA 20 MEQ tablet  Commonly known as:  KLOR-CON M20  Take 1 tablet (20 mEq total) by mouth 2 (two) times daily.     pravastatin 80 MG tablet  Commonly known as:  PRAVACHOL  Take 1 tablet (80 mg total) by mouth at bedtime.     senna-docusate 8.6-50 MG per tablet  Commonly known as:  Senokot-S  Take 1 tablet by mouth at bedtime as needed for mild constipation.     thiamine 100 MG tablet  Take 1 tablet (100 mg total) by mouth daily.     tiotropium 18 MCG inhalation capsule  Commonly known as:  SPIRIVA HANDIHALER  PLACE ONE CAPSULE INTO INHALER AND  INHALE  DAILY     torsemide 20 MG tablet  Commonly known as:  DEMADEX  Take 2 tablets (40 mg total) by mouth daily.       Follow-up Information    Follow up with Glori Bickers, MD. Go on 10/30/2014.   Specialty:  Cardiology   Why:  at 3:20pm in the Advanced Heart Failure clinic--gate code 0800--please bring all medications to appt   Contact information:   [place] [address] [phone]       Call Meredith Staggers, MD.   Specialty:  Physical Medicine and Rehabilitation   Why:  As needed   Contact information:  [address], [place] [address] [phone]       Follow up with Scarlette Shorts, MD. Call today.    Specialty:  Gastroenterology   Why:  for follow up appointment    Contact information:   [address] [place] [address] [phone]       Follow up with Tammi Sou, MD On 10/24/2014.   Specialty:  Family Medicine   Why:  Be there at 9:30 am for post hospital follow up/ Recheck sodium level and CBC   Contact information:   [address] [place] [address] [phone]       Signed: Bary Leriche 10/18/2014, 5:32 PM

## 2014-10-18 NOTE — Progress Notes (Signed)
Social Work Patient ID: [name], male   DOB: 01-27-1936, 79 y.o.   MRN: [id number]  Met with pt yesterday afternoon to review team conference.  He was very pleased with targeted d/c for today, however, this morning alerted that d/c on hold pending labs.  Have made d/c referral for OPPT.  Will monitor d/c readiness.  Adrian Specht, LCSW

## 2014-10-18 NOTE — Discharge Instructions (Signed)
Inpatient Rehab Discharge Instructions  TEVON BERHANE Discharge date and time:  10/18/14  Activities/Precautions/ Functional Status: Activity: activity as tolerated Diet: cardiac diet. Low salt. Limit total fluid intake to 1500 cc/daily. Wound Care: none needed   Functional status:  ___ No restrictions     ___ Walk up steps independently ___ 24/7 supervision/assistance   ___ Walk up steps with assistance ___ Intermittent supervision/assistance  ___ Bathe/dress independently ___ Walk with walker     ___ Bathe/dress with assistance ___ Walk Independently    ___ Shower independently ___ Walk with assistance    ___ Shower with assistance _X__ No alcohol     ___ Return to work/school ________     COMMUNITY REFERRALS UPON DISCHARGE:    Outpatient: PT                    Agency: [facility]      Phone: [phone] Location:  [address]  [place]               Appointment Date/Time: 10/26/14 @ 1:00 pm      Special Instructions:    My questions have been answered and I understand these instructions. I will adhere to these goals and the provided educational materials after my discharge from the hospital.  Patient/Caregiver Signature _______________________________ Date __________  Clinician Signature _______________________________________ Date __________  Please bring this form and your medication list with you to all your follow-up doctor's appointments.

## 2014-10-18 NOTE — Patient Care Conference (Signed)
Inpatient RehabilitationTeam Conference and Plan of Care Update Date: 10/17/2014   Time: 2:05 PM    Patient Name: [name]      Medical Record Number: [id number]  Date of Birth: [birth year] Sex: Male         Room/Bed: 4M05C/4M05C-01 Payor Info: Payor: MEDICARE / Plan: MEDICARE PART A AND B / Product Type: *No Product type* /    Admitting Diagnosis: debility  Admit Date/Time:  10/13/2014  3:48 PM Admission Comments: No comment available   Primary Diagnosis:  Physical deconditioning Principal Problem: Physical deconditioning  Patient Active Problem List   Diagnosis Date Noted  . Physical deconditioning 10/13/2014  . Chronic atrial fibrillation   . Hospital acquired PNA   . Weakness   . Chronic respiratory failure with hypoxia   . Anemia 10/10/2014  . GI bleed 10/10/2014  . Diabetes mellitus type 2, controlled 10/10/2014  . Heme positive stool   . Anemia due to chronic blood loss   . Chronic anticoagulation   . OSA (obstructive sleep apnea)   . CAD in native artery   . Pulmonary hypertension   . Severe mitral regurgitation   . COPD exacerbation   . Obstructive sleep apnea   . Transaminitis   . Cardiomyopathy, ischemic   . Chronic systolic congestive heart failure   . Paroxysmal atrial fibrillation   . Acute on chronic renal failure   . Diabetes type 2, controlled   . Other specified hypothyroidism   . Esophageal candidiasis   . Normocytic anemia   . HCAP (healthcare-associated pneumonia)   . FUO (fever of unknown origin)   . Fever 09/09/2014  . Fatigue 09/09/2014  . Candida esophagitis 09/09/2014  . Chronic hyponatremia 09/09/2014  . Metabolic encephalopathy 08/65/7846  . Iron deficiency anemia due to chronic blood loss   . CKD (chronic kidney disease) stage 3, GFR 30-59 ml/min   . Mitral regurgitation 05/09/2014  . Elevated CEA 04/03/2014  . Type 2 diabetes mellitus, uncontrolled 03/13/2014  . Insomnia 03/13/2014  . Anorexia 02/12/2014  . Low back pain  02/12/2014  . Chronic systolic CHF (congestive heart failure), NYHA class 4 01/24/2014  . COPD, severe 01/02/2014  . Atrial flutter 12/12/2013  . Pleural plaque 08/31/2013  . Colonic stricture 12/22/2012  . Automatic implantable cardioverter-defibrillator in situ 04/17/2010  . Carotid artery disease 04/16/2010  . SEBORRHEIC DERMATITIS 05/30/2009  . ANXIETY 08/02/2007  . Coronary atherosclerosis 08/02/2007  . VENTRICULAR TACHYCARDIA 08/02/2007  . RENAL INSUFFICIENCY 08/02/2007  . DEGENERATIVE JOINT DISEASE 08/02/2007  . COLONIC POLYPS 03/24/2007  . HYPERCHOLESTEROLEMIA 03/24/2007  . Essential hypertension 03/24/2007  . PERIPHERAL VASCULAR DISEASE 03/24/2007    Expected Discharge Date: Expected Discharge Date: 10/18/14  Team Members Present: Physician leading conference: Dr. Alger Simons Social Worker Present: Lennart Pall, LCSW Nurse Present: Heather Roberts, RN PT Present: Canary Brim, PT OT Present: Salome Spotted, OT;Other (comment) (Amy Bobby Rumpf, OT) SLP Present: Weston Anna, SLP PPS Coordinator present : Daiva Nakayama, RN, CRRN     Current Status/Progress Goal Weekly Team Focus  Medical   chf, pneumonia and deconditionng  improve activity tolerance  stabilze medically for dc   Bowel/Bladder   cont of B+B; no BM since 5/21  regular elimination pattern  assessneed for laxative prn   Swallow/Nutrition/ Hydration             ADL's   mod I overall  mod I overall  d/c planning, activity tolerance, standing balance, continuing education   Mobility   S to  mod I overall  mod I transfers and gait; S car and stairs  d/c planning, endurance, functional strengthening, dynamic balance, gait and stairs   Communication             Safety/Cognition/ Behavioral Observations            Pain   n/a         Skin   multiple abrasions/skin tears on arms  no further skin breakdown  assess skin daily; remind pt of safety during activities    Rehab Goals Patient on target to meet rehab  goals: Yes *See Care Plan and progress notes for long and short-term goals.  Barriers to Discharge: baseline pulmonary issues, stamina    Possible Resolutions to Barriers:  pacing, ?oxygen at home, follow up at home    Discharge Planning/Teaching Needs:  home with wife able to provide any needed assistance      Team Discussion:  Excellent gains and reaching mod i goals.  Team feels is ready for d/c tomorrow and recommend OPPT follow up.  Revisions to Treatment Plan:  None   Continued Need for Acute Rehabilitation Level of Care: The patient requires daily medical management by a physician with specialized training in physical medicine and rehabilitation for the following conditions: Daily direction of a multidisciplinary physical rehabilitation program to ensure safe treatment while eliciting the highest outcome that is of practical value to the patient.: Yes Daily medical management of patient stability for increased activity during participation in an intensive rehabilitation regime.: Yes Daily analysis of laboratory values and/or radiology reports with any subsequent need for medication adjustment of medical intervention for : Cardiac problems;Pulmonary problems;Other  Kimm Ungaro, [place] 10/18/2014, 11:17 AM

## 2014-10-18 NOTE — Progress Notes (Signed)
Pt discharged at 67 with wife to home. Discharge instructions given to pt by Algis Liming, PA with verbal understanding.

## 2014-10-19 ENCOUNTER — Encounter: Payer: Self-pay | Admitting: Cardiology

## 2014-10-19 NOTE — Progress Notes (Signed)
Occupational Therapy Discharge Summary  Patient Details  Name: AUTHUR CUBIT MRN: [id number] Date of Birth: 25-Jul-1935  Today's Date: 10/19/2014   Patient has met 9 of 9 long term goals due to improved activity tolerance.  Patient to discharge at overall Modified Independent level.  Patient's care partner is independent to provide the necessary physical assistance at discharge, as needed prior to admission to don pt's compressive stockings.   Reasons goals not met: n/a  Recommendation:  Patient will not requirefrom ongoing skilled OT services to continue to advance functional skills in the area of BADL and iADL.  Equipment: No equipment provided  Reasons for discharge: treatment goals met  Patient/family agrees with progress made and goals achieved: Yes  OT Discharge Precautions/Restrictions  Precautions Precautions: ICD/Pacemaker Restrictions Weight Bearing Restrictions: No General   Vital Signs  Pain Pain Assessment Pain Assessment: No/denies pain ADL ADL ADL Comments: see FIM Vision/Perception  Vision- History Baseline Vision/History: Wears glasses Wears Glasses: At all times Patient Visual Report: No change from baseline Vision- Assessment Vision Assessment?: No apparent visual deficits Perception Comments: WFL  Cognition Overall Cognitive Status: Within Functional Limits for tasks assessed Arousal/Alertness: Awake/alert Orientation Level: Oriented X4 Attention: Alternating Alternating Attention: Appears intact Memory: Appears intact Awareness: Appears intact Problem Solving: Appears intact Safety/Judgment: Appears intact Sensation Sensation Light Touch: Appears Intact Stereognosis: Appears Intact Hot/Cold: Appears Intact Proprioception: Appears Intact Coordination Gross Motor Movements are Fluid and Coordinated: Yes Fine Motor Movements are Fluid and Coordinated: Yes Motor  Motor Motor: Within Functional Limits Mobility  Bed Mobility Bed  Mobility: Rolling Right;Rolling Left;Left Sidelying to Sit;Supine to Sit Rolling Right: 7: Independent Rolling Left: 7: Independent Left Sidelying to Sit: 7: Independent Supine to Sit: 7: Independent Sit to Supine: 7: Independent Transfers Transfers: Sit to Stand;Stand to Sit Sit to Stand: 6: Modified independent (Device/Increase time) Stand to Sit: 6: Modified independent (Device/Increase time)  Trunk/Postural Assessment  Cervical Assessment Cervical Assessment: Within Functional Limits Thoracic Assessment Thoracic Assessment: Within Functional Limits Lumbar Assessment Lumbar Assessment: Within Functional Limits Postural Control Postural Control: Within Functional Limits  Balance Balance Balance Assessed: Yes Static Sitting Balance Static Sitting - Level of Assistance: 7: Independent Dynamic Sitting Balance Dynamic Sitting - Level of Assistance: 7: Independent Static Standing Balance Static Standing - Balance Support: During functional activity Static Standing - Level of Assistance: 7: Independent Extremity/Trunk Assessment RUE Assessment RUE Assessment: Within Functional Limits LUE Assessment LUE Assessment: Within Functional Limits  See FIM for current functional status  [place] 10/19/2014, 7:08 AM

## 2014-10-19 NOTE — Progress Notes (Signed)
Physical Therapy Discharge Summary  Patient Details  Name: [name] MRN: [id number] Date of Birth: [birth year]  Today's Date: 10/19/2014    Patient has met 9 of 10 long term goals due to improved activity tolerance.  Patient to discharge at an ambulatory level Modified Independent.   Patient's care partner is independent to provide the necessary physical assistance at discharge.  Reasons goals not met: Floor transfers not practiced due to patient with skin tears on arms; but did note practiced with OT during last session prior to d/c.  Recommendation:  Patient will benefit from ongoing skilled PT services in outpatient setting to continue to advance safe functional mobility, address ongoing impairments in general balance and strength, and minimize fall risk.  Equipment: No equipment provided  Reasons for discharge: discharge from hospital  Patient/family agrees with progress made and goals achieved: Yes  PT Discharge Precautions/Restrictions Precautions Precautions: ICD/Pacemaker Vital Signs  Pain Pain Assessment Pain Assessment: No/denies pain Vision/Perception     Cognition Overall Cognitive Status: Within Functional Limits for tasks assessed Arousal/Alertness: Awake/alert Orientation Level: Oriented X4 Sensation Sensation Light Touch: Appears Intact Coordination Gross Motor Movements are Fluid and Coordinated: Yes Fine Motor Movements are Fluid and Coordinated: Yes Motor  Motor Motor: Within Functional Limits      Trunk/Postural Assessment  Cervical Assessment Cervical Assessment: Within Functional Limits Thoracic Assessment Thoracic Assessment: Within Functional Limits Lumbar Assessment Lumbar Assessment: Within Functional Limits Postural Control Postural Control: Within Functional Limits  Balance Static Sitting Balance Static Sitting - Level of Assistance: 7: Independent Static Standing Balance Static Standing - Balance Support: During  functional activity Static Standing - Level of Assistance: 7: Independent Extremity Assessment      RLE Strength RLE Overall Strength: Within Functional Limits for tasks assessed RLE Overall Strength Comments: grossly 4+/5 except hip flexion 4-/5 LLE Strength LLE Overall Strength: Within Functional Limits for tasks assessed LLE Overall Strength Comments: grossly 4+/5 except hip flexion 4-/5  See FIM for current functional status  [name],CYNDI 10/19/2014, 1:14 PM  Magda Kiel, [place] 10/19/2014

## 2014-10-20 ENCOUNTER — Encounter: Payer: Self-pay | Admitting: Internal Medicine

## 2014-10-22 ENCOUNTER — Telehealth: Payer: Self-pay | Admitting: Cardiology

## 2014-10-22 NOTE — Telephone Encounter (Signed)
Wife called due to wt increase wt of 4 pounds overnight. -his metolazone had been stopped at dischraged from rehab.  (d/c'd on the 25th.)    Took the metolazone today 2.5 mg -agree that was correct plan.  If tomorrow wt back down he will not take tomorrow, but if wt still up he will repeat.  BP has been stable.  He does check at home.

## 2014-10-22 NOTE — Progress Notes (Signed)
Social Work  Discharge Note  The overall goal for the admission was met for:   Discharge location: Yes - home with wife to provide any needed assistance  Length of Stay: Yes - 5 days  Discharge activity level: Yes - modified independent  Home/community participation: Yes  Services provided included: MD, RD, PT, OT, RN, TR, Pharmacy and SW  Financial Services: Medicare and Private Insurance: [place]  Follow-up services arranged: Outpatient: PT via [facility] and Patient/Family has no preference for HH/DME agencies  Comments (or additional information):  Patient/Family verbalized understanding of follow-up arrangements: Yes  Individual responsible for coordination of the follow-up plan: patient  Confirmed correct DME delivered: NA - had all needed DME    [name]

## 2014-10-24 ENCOUNTER — Encounter: Payer: Self-pay | Admitting: Family Medicine

## 2014-10-24 ENCOUNTER — Ambulatory Visit (INDEPENDENT_AMBULATORY_CARE_PROVIDER_SITE_OTHER): Payer: Medicare Other | Admitting: Family Medicine

## 2014-10-24 ENCOUNTER — Ambulatory Visit (HOSPITAL_BASED_OUTPATIENT_CLINIC_OR_DEPARTMENT_OTHER)
Admission: RE | Admit: 2014-10-24 | Discharge: 2014-10-24 | Disposition: A | Discharge status: Home / Self Care | Payer: Medicare Other | Source: Ambulatory Visit | Attending: Family Medicine | Admitting: Family Medicine

## 2014-10-24 VITALS — BP 92/45 | HR 56 | Temp 97.6°F | Resp 16 | Wt 151.0 lb

## 2014-10-24 DIAGNOSIS — I255 Ischemic cardiomyopathy: Secondary | ICD-10-CM | POA: Diagnosis not present

## 2014-10-24 DIAGNOSIS — R7401 Elevation of levels of liver transaminase levels: Secondary | ICD-10-CM

## 2014-10-24 DIAGNOSIS — E871 Hypo-osmolality and hyponatremia: Secondary | ICD-10-CM | POA: Diagnosis not present

## 2014-10-24 DIAGNOSIS — R740 Nonspecific elevation of levels of transaminase and lactic acid dehydrogenase [LDH]: Secondary | ICD-10-CM | POA: Diagnosis not present

## 2014-10-24 DIAGNOSIS — N183 Chronic kidney disease, stage 3 unspecified: Secondary | ICD-10-CM

## 2014-10-24 DIAGNOSIS — J189 Pneumonia, unspecified organism: Secondary | ICD-10-CM | POA: Insufficient documentation

## 2014-10-24 DIAGNOSIS — I5022 Chronic systolic (congestive) heart failure: Secondary | ICD-10-CM | POA: Diagnosis not present

## 2014-10-24 DIAGNOSIS — J449 Chronic obstructive pulmonary disease, unspecified: Secondary | ICD-10-CM

## 2014-10-24 DIAGNOSIS — E039 Hypothyroidism, unspecified: Secondary | ICD-10-CM | POA: Diagnosis not present

## 2014-10-24 DIAGNOSIS — D500 Iron deficiency anemia secondary to blood loss (chronic): Secondary | ICD-10-CM | POA: Diagnosis not present

## 2014-10-24 LAB — CBC WITH DIFFERENTIAL/PLATELET
BASOS ABS: 0 10*3/uL (ref 0.0–0.1)
BASOS PCT: 0.4 % (ref 0.0–3.0)
EOS PCT: 0.3 % (ref 0.0–5.0)
Eosinophils Absolute: 0 10*3/uL (ref 0.0–0.7)
HCT: 21.5 % — CL (ref 39.0–52.0)
Hemoglobin: 7.2 g/dL — CL (ref 13.0–17.0)
LYMPHS ABS: 1.2 10*3/uL (ref 0.7–4.0)
Lymphocytes Relative: 17.9 % (ref 12.0–46.0)
MCHC: 33.6 g/dL (ref 30.0–36.0)
MCV: 91.2 fl (ref 78.0–100.0)
MONOS PCT: 12.6 % — AB (ref 3.0–12.0)
Monocytes Absolute: 0.9 10*3/uL (ref 0.1–1.0)
NEUTROS ABS: 4.7 10*3/uL (ref 1.4–7.7)
Neutrophils Relative %: 68.8 % (ref 43.0–77.0)
Platelets: 200 10*3/uL (ref 150.0–400.0)
RBC: 2.35 Mil/uL — ABNORMAL LOW (ref 4.22–5.81)
RDW: 17.2 % — AB (ref 11.5–15.5)
WBC: 6.8 10*3/uL (ref 4.0–10.5)

## 2014-10-24 LAB — COMPREHENSIVE METABOLIC PANEL
ALK PHOS: 92 U/L (ref 39–117)
ALT: 16 U/L (ref 0–53)
AST: 50 U/L — ABNORMAL HIGH (ref 0–37)
Albumin: 2.5 g/dL — ABNORMAL LOW (ref 3.5–5.2)
BUN: 38 mg/dL — ABNORMAL HIGH (ref 6–23)
CHLORIDE: 90 meq/L — AB (ref 96–112)
CO2: 27 meq/L (ref 19–32)
Calcium: 8.2 mg/dL — ABNORMAL LOW (ref 8.4–10.5)
Creatinine, Ser: 2.2 mg/dL — ABNORMAL HIGH (ref 0.40–1.50)
GFR: 30.98 mL/min — ABNORMAL LOW (ref >60.00)
Glucose, Bld: 161 mg/dL — ABNORMAL HIGH (ref 70–99)
POTASSIUM: 4.4 meq/L (ref 3.5–5.1)
Sodium: 126 mEq/L — ABNORMAL LOW (ref 135–145)
Total Bilirubin: 1 mg/dL (ref 0.2–1.2)
Total Protein: 5.4 g/dL — ABNORMAL LOW (ref 6.0–8.3)

## 2014-10-24 LAB — TSH: TSH: 8.16 u[IU]/mL — AB (ref 0.35–4.50)

## 2014-10-24 NOTE — Progress Notes (Signed)
Pre visit review using our clinic review tool, if applicable. No additional management support is needed unless otherwise documented below in the visit note. 

## 2014-10-24 NOTE — Progress Notes (Signed)
OFFICE VISIT  10/24/2014   CC:  Chief Complaint  Patient presents with  . Hospitalization Follow-up    Pt is not fasting   HPI:    Patient is a 79 y.o. Caucasian male who presents for hospital follow up. Admitted to [facility] system 5/16-5/20 for anemia secondary to chronic blood loss--site of bleeding was never determined, he got transfused 2 U pRBCs--Hb stayed stable after this.  He developed HC assoc pneumonia and was treated with IV abx and transitioned to augmentin, which he finished while in physical med and rehab unit from 5/20-5/25, 2016.    He has noted feeling more fatigued since going home 10/18/14, also coughing up yellow phlegm--hard to tell if this is new or not from talking with pt and his wife, but with his HC assoc pneumonia dx I suspect he has had this all along. No increase in baseline breathlessness/DOE, no chest pain, no palpitations, no fevers, not much cough. No melena or hematochezia or gross hematuria.  No hemoptysis.  He noted wt increase 3 lb in one day (three days ago) so he took a 5mg  metolazone tab once a day x 2d (yesterday and day before) but his wt has remained at 146 on his home scale.  He does not feel like he has abdominal distention, nor does he feel like his LE swelling is significantly changed from baseline. Appetite is good.  He and his wife say he is compliant with 1.5 L/day fluid limitation and <2g Na/day limit.  No palpitations or racing heart.  Past Medical History  Diagnosis Date  . VENTRICULAR TACHYCARDIA   . PERIPHERAL VASCULAR DISEASE   . HYPERTENSION     Hypertensive retinopathy-grade II OU  . HYPERCHOLESTEROLEMIA   . CORONARY ARTERY DISEASE     Hx of CABG  . Chronic systolic heart failure     Ischemic cardiomyopathy (01/2014 EF 40-45% with hypokinesis + small area of akinesis  . SEBORRHEIC DERMATITIS   . Chronic renal insufficiency, stage III (moderate)     CrCl 40s-50s  . DIVERTICULOSIS OF COLON   . COPD, severe   . COLONIC POLYPS    . ANXIETY   . ICD (implantable cardiac defibrillator) in place   . Type II diabetes mellitus     +background diabetic retinopathy OU  . Chronic lower back pain   . DJD (degenerative joint disease)     "hands" (05/24/2012)  . Osteoarthritis of finger   . History of atrial flutter   . Thrombus 12/2013    on ICD lead--started anticoag and his cardioversion for atrial flutter was postponed.  . Colon stricture 2015    with features worrisome for mass, also with recent rising CEA level (possible colon cancer)-GI MD= Dr. Franki Cabot recent eval/follow up with Dr. Earlean Shawl was summer 2014, at which time he recommended colonoscopy but pt declined.  . Macular degeneration, age related, nonexudative     OU    Past Surgical History  Procedure Laterality Date  . Coronary artery bypass graft  1990's    CABG X4  . Tonsillectomy  ?1942  . Cardiac defibrillator placement  12/2004    single chamber defibrillator- Medtronic Maxima 8/06 DrKlein [Other][  . Tee without cardioversion N/A 12/22/2013    Procedure: TRANSESOPHAGEAL ECHOCARDIOGRAM (TEE);  Surgeon: Thayer Headings, MD;  Location: [place];  Service: Cardiovascular;  Laterality: N/A;  . Cardioversion N/A 12/22/2013    Procedure: CARDIOVERSION;  Surgeon: Thayer Headings, MD;  Location: [place];  Service: Cardiovascular;  Laterality:  N/A;  . Cardiovascular stress test  01/12/13    myocard perf imaging: previous infarct with a moderately reduced EF as was known previously.  No new findings.   . Gastric emptying scan  02/02/14    Normal  . Transthoracic echocardiogram  01/25/14; 04/2014    EF 40-45%, diffuse hypokinesis, infero-basilar myocardial akinesis, PA pressure slightly high, valves fine.04/2014-Echo today EF ~40% inferior AK moderate to severe MR  . Abdominal ultrasound  01/2014    GB sludge, o/w normal  . Ct virtual colonoscopy diagnostic  11/2012    Asymmetric thickening of the rectosigmoid colon worrisome for  . Cardiovascular stress  test      Lexiscan: There is a medium sized fixed inferior wall defect of moderate severity involving the apical inferior and mid inferior and basal inferoseptal segments, consistent with old inferior MI. No significant reversible ischemia.  EF 34%, inferior wall hypokinesis--intermediate risk scan (ok to do planned procedure)  . Implantable cardioverter defibrillator revision N/A 05/25/2012    Procedure: IMPLANTABLE CARDIOVERTER DEFIBRILLATOR REVISION;  Surgeon: Evans Lance, MD;  Location: [facility] CATH LAB;  Service: Cardiovascular;  Laterality: N/A;  . Right heart catheterization N/A 08/31/2014    Procedure: RIGHT HEART CATH;  Surgeon: Larey Dresser, MD;  Location: [facility] CATH LAB;  Service: Cardiovascular;  Laterality: N/A;  . Esophagogastroduodenoscopy N/A 09/03/2014    Procedure: ESOPHAGOGASTRODUODENOSCOPY (EGD);  Surgeon: Carol Ada, MD;  Location: [facility] ENDOSCOPY;  Service: Endoscopy;  Laterality: N/A;  . Givens capsule study N/A 10/12/2014    Procedure: GIVENS CAPSULE STUDY;  Surgeon: Irene Shipper, MD;  Location: [place];  Service: Endoscopy;  Laterality: N/A;    Outpatient Prescriptions Prior to Visit  Medication Sig Dispense Refill  . amiodarone (PACERONE) 200 MG tablet Take 1 tablet (200 mg total) by mouth daily. 30 tablet 0  . apixaban (ELIQUIS) 2.5 MG TABS tablet Take 1 tablet (2.5 mg total) by mouth 2 (two) times daily. 60 tablet 0  . carvedilol (COREG) 3.125 MG tablet Take 1 tablet (3.125 mg total) by mouth 2 (two) times daily with a meal. 60 tablet 0  . Fluticasone-Salmeterol (ADVAIR) 500-50 MCG/DOSE AEPB Inhale 1 puff into the lungs 2 (two) times daily. 60 each 11  . folic acid (FOLVITE) 1 MG tablet Take 1 tablet (1 mg total) by mouth daily. 30 tablet 0  . guaiFENesin (MUCINEX) 600 MG 12 hr tablet Take 1 tablet (600 mg total) by mouth 2 (two) times daily.    . hydrALAZINE (APRESOLINE) 25 MG tablet Take 1 tablet (25 mg total) by mouth 3 (three) times daily. 90 tablet 1  . Insulin  Glargine (LANTUS SOLOSTAR) 100 UNIT/ML Solostar Pen 10 units SQ qhs (Patient taking differently: Inject 10 Units into the skin at bedtime. ) 15 mL 11  . insulin lispro (HUMALOG KWIKPEN) 100 UNIT/ML KiwkPen Take 5 units in the morning, 5 units at noon, --if blood sugar over 100 and you eat > 50% of meals 5 pen 3  . isosorbide mononitrate (IMDUR) 30 MG 24 hr tablet Take 1 tablet (30 mg total) by mouth daily. 30 tablet 0  . levothyroxine (SYNTHROID, LEVOTHROID) 75 MCG tablet Take 1 tablet (75 mcg total) by mouth daily. 30 tablet 3  . pantoprazole (PROTONIX) 40 MG tablet Take 1 tablet (40 mg total) by mouth daily. 30 tablet 1  . potassium chloride SA (KLOR-CON M20) 20 MEQ tablet Take 1 tablet (20 mEq total) by mouth 2 (two) times daily. 60 tablet 1  . pravastatin (  PRAVACHOL) 80 MG tablet Take 1 tablet (80 mg total) by mouth at bedtime. 30 tablet 0  . thiamine 100 MG tablet Take 1 tablet (100 mg total) by mouth daily. 30 tablet 0  . tiotropium (SPIRIVA HANDIHALER) 18 MCG inhalation capsule PLACE ONE CAPSULE INTO INHALER AND  INHALE  DAILY 30 capsule 0  . torsemide (DEMADEX) 20 MG tablet Take 2 tablets (40 mg total) by mouth daily. 60 tablet 0  . fexofenadine (ALLEGRA) 180 MG tablet Take 180 mg by mouth daily as needed for allergies or rhinitis.    Marland Kitchen senna-docusate (SENOKOT-S) 8.6-50 MG per tablet Take 1 tablet by mouth at bedtime as needed for mild constipation. (Patient not taking: Reported on 10/24/2014) 100 tablet 1   No facility-administered medications prior to visit.    Allergies  Allergen Reactions  . Niacin Itching    Niaspan     ROS As per HPI  PE: Blood pressure 92/45, pulse 56, temperature 97.6 F (36.4 C), temperature source Temporal, resp. rate 16, weight 151 lb (68.493 kg), SpO2 100 %. Gen: appears tired but in NAD.  Alert, oriented x 4, lucid thought and speech. Mildly SOB at rest, requires considerable effort to get up onto exam table but minimal assistance required to do  this. No scleral icterus. Oropharynx pink, slightly tacky. CV: RRR, 2/6 systolic murmur w/out rub or gallop. LUNGS: CTA bilat, nonlabored breathing.  Exp phase not signif prolonged.  Good aeration diffusely. ABD: soft, mildly distended, BS normal.   No HSM or mass or bruit.   +Flank dullness to percussion.  No abd wall edema, no fluid wave.  No tenderness to palpation. EXT: 3+ pitting edema in both LL's, compression stockings in place. SKIN: lots of bruises but no jaundice  LABS:  Lab Results  Component Value Date   HGBA1C 5.3 09/09/2014    Lab Results  Component Value Date   TSH 6.759* 08/31/2014   Lab Results  Component Value Date   WBC 6.4 10/18/2014   HGB 8.1* 10/18/2014   HCT 24.1* 10/18/2014   MCV 90.1 10/18/2014   PLT 125* 10/18/2014   Lab Results  Component Value Date   CREATININE 1.81* 10/18/2014   BUN 34* 10/18/2014   NA 125* 10/18/2014   K 3.6 10/18/2014   CL 88* 10/18/2014   CO2 28 10/18/2014   Lab Results  Component Value Date   ALT 18 10/14/2014   AST 52* 10/14/2014   ALKPHOS 92 10/14/2014   BILITOT 1.3* 10/14/2014   Lab Results  Component Value Date   CHOL 83 02/01/2014   Lab Results  Component Value Date   HDL 10* 02/01/2014   Lab Results  Component Value Date   LDLCALC 53 02/01/2014   Lab Results  Component Value Date   TRIG 102 02/01/2014   Lab Results  Component Value Date   CHOLHDL 8.3 02/01/2014    IMPRESSION AND PLAN:  1) Fatigue worse s/p recent hospitalization: could be normal/baseline due to his current level of anemia.  However, also could be slightly worsened/decompensated CHF, could be hypotension/volume depletion due to recent increased diuresis, could be ongoing HC assoc pneumonia, could be worsening hyponatremia and renal failure. Will check CXR again today, check CBC w/diff, CMET. Continue all current chronic med regimen (without the metolazone at this point), including eliquis 2.5mg  bid (no site of bleeding was  found, hb's were stable).  2) Hypothyroidism: TSH recheck today (was mildly elevated about 7 wks ago in hosp; T4 and T3 were  normal at that time.    3) Chronic systolic CHF: continue hydralazine, low dose beta blocker, imdur, demadex, Na limitation (<2 g/day) and fluid limitation (1.5 L/day).  4) Hx of a-fib: continue amiodarone and eliquis. Currently regular rhythm.  5) DM 2, has been well controlled, continue current insulin regimen and diet.  6) COPD: will consider 10d taper of systemic steroids if CXR shows no worsening/new infiltrate and if no pulm edema.  7) Hyponatremia: due to chronic CHF.  Recheck Na today.  An After Visit Summary was printed and given to the patient.  FOLLOW UP: Return in about 4 weeks (around 11/21/2014) for routine chronic illness f/u.

## 2014-10-25 ENCOUNTER — Other Ambulatory Visit: Payer: Self-pay | Admitting: Family Medicine

## 2014-10-25 ENCOUNTER — Other Ambulatory Visit (HOSPITAL_COMMUNITY): Payer: Self-pay | Admitting: *Deleted

## 2014-10-25 ENCOUNTER — Telehealth: Payer: Self-pay | Admitting: Internal Medicine

## 2014-10-25 ENCOUNTER — Encounter: Payer: Self-pay | Admitting: Family Medicine

## 2014-10-25 ENCOUNTER — Other Ambulatory Visit: Payer: Medicare Other

## 2014-10-25 ENCOUNTER — Other Ambulatory Visit: Payer: Self-pay | Admitting: *Deleted

## 2014-10-25 DIAGNOSIS — D649 Anemia, unspecified: Secondary | ICD-10-CM

## 2014-10-25 MED ORDER — LEVOTHYROXINE SODIUM 88 MCG PO TABS: 88.0000 ug | ORAL_TABLET | Freq: Every day | ORAL | Status: DC

## 2014-10-25 NOTE — Telephone Encounter (Signed)
On call note about low H and H on evening of 05/31 I called the pt, no answer, left VM with my return phone number No return call recieved

## 2014-10-25 NOTE — Addendum Note (Signed)
Addended by: Lanae Crumbly on: 10/25/2014 10:46 AM   Modules accepted: Orders

## 2014-10-26 ENCOUNTER — Encounter (HOSPITAL_COMMUNITY)
Admission: RE | Admit: 2014-10-26 | Discharge: 2014-10-26 | Disposition: A | Discharge status: Home / Self Care | Payer: Medicare Other | Source: Ambulatory Visit | Attending: Family Medicine | Admitting: Family Medicine

## 2014-10-26 DIAGNOSIS — I480 Paroxysmal atrial fibrillation: Secondary | ICD-10-CM | POA: Diagnosis not present

## 2014-10-26 DIAGNOSIS — D500 Iron deficiency anemia secondary to blood loss (chronic): Secondary | ICD-10-CM | POA: Diagnosis not present

## 2014-10-26 LAB — HAPTOGLOBIN: Haptoglobin: 27 mg/dL — ABNORMAL LOW (ref 43–212)

## 2014-10-26 LAB — PREPARE RBC (CROSSMATCH)

## 2014-10-26 MED ORDER — SODIUM CHLORIDE 0.9 % IV SOLN: Freq: Once | INTRAVENOUS | Status: DC

## 2014-10-27 ENCOUNTER — Encounter: Payer: Self-pay | Admitting: Internal Medicine

## 2014-10-27 ENCOUNTER — Encounter: Payer: Self-pay | Admitting: Family Medicine

## 2014-10-27 LAB — TYPE AND SCREEN
ABO/RH(D): O POS
Antibody Screen: NEGATIVE
UNIT DIVISION: 0
Unit division: 0

## 2014-10-30 ENCOUNTER — Ambulatory Visit (HOSPITAL_COMMUNITY)
Admission: RE | Admit: 2014-10-30 | Discharge: 2014-10-30 | Disposition: A | Discharge status: Home / Self Care | Payer: Medicare Other | Source: Ambulatory Visit | Attending: Internal Medicine | Admitting: Internal Medicine

## 2014-10-30 ENCOUNTER — Telehealth: Payer: Self-pay

## 2014-10-30 VITALS — BP 98/48 | HR 57 | Wt 147.8 lb

## 2014-10-30 DIAGNOSIS — I482 Chronic atrial fibrillation, unspecified: Secondary | ICD-10-CM

## 2014-10-30 DIAGNOSIS — I480 Paroxysmal atrial fibrillation: Secondary | ICD-10-CM | POA: Diagnosis not present

## 2014-10-30 DIAGNOSIS — I5022 Chronic systolic (congestive) heart failure: Secondary | ICD-10-CM | POA: Diagnosis not present

## 2014-10-30 DIAGNOSIS — D500 Iron deficiency anemia secondary to blood loss (chronic): Secondary | ICD-10-CM | POA: Diagnosis not present

## 2014-10-30 LAB — BASIC METABOLIC PANEL
Anion gap: 11 (ref 5–15)
BUN: 36 mg/dL — ABNORMAL HIGH (ref 6–20)
CHLORIDE: 94 mmol/L — AB (ref 101–111)
CO2: 26 mmol/L (ref 22–32)
Calcium: 8.5 mg/dL — ABNORMAL LOW (ref 8.9–10.3)
Creatinine, Ser: 2.15 mg/dL — ABNORMAL HIGH (ref 0.61–1.24)
GFR calc non Af Amer: 28 mL/min — ABNORMAL LOW (ref >60)
GFR, EST AFRICAN AMERICAN: 32 mL/min — AB (ref >60)
Glucose, Bld: 168 mg/dL — ABNORMAL HIGH (ref 65–99)
POTASSIUM: 5.1 mmol/L (ref 3.5–5.1)
Sodium: 131 mmol/L — ABNORMAL LOW (ref 135–145)

## 2014-10-30 MED ORDER — METOLAZONE 2.5 MG PO TABS
ORAL_TABLET | ORAL | Status: DC
Start: 2014-10-30 — End: 2014-11-04

## 2014-10-30 MED ORDER — TORSEMIDE 20 MG PO TABS: 60.0000 mg | ORAL_TABLET | Freq: Every day | ORAL | Status: DC

## 2014-10-30 NOTE — Progress Notes (Signed)
Patient ID: KENAZ OLAFSON, male   DOB: 24-Sep-1935, 79 y.o.   MRN: [id number]  PCP: Dr Ernestine Conrad GI: Dr Earlean Shawl.    HPI: Mr. Arwood is a 79 yo male with a history of HTN, CAD s/p CABG, ICM s/p Medtronic ICD, chronic systolic heart failure, DM2, Vtach, atrial flutter, PVD and GI mass. ECHO (12/22/13) EF 20-25%, Echo  EF 40% (12/15)  Mod-severe MR.   On September 1st he was admitted from Dr. Rosezella [place] office with volume overload and low output heart failure. Creatinine was 2.8 on admit (baseline 1.6). Central line was placed to monitor CVP and CO-OX. Initial CO-OX was 49% and he was started on Milrinone and IV lasix. Discharge weight was 151 pounds. Creatinine was 1.42.   Saw Jeff Medoff  for possible colon mass/thickening and felt to be result of previous diverticulitis.   Was admitted 2x in April. In early April had a/c CHF with cardiorenal syndrome. Treated with milrinone and improved. Also had GI bleed at that time and EGD with candidal esophagitis. In late April admitted with PNA. Treated with abx. D/c weight was 147  He returns for post hospital follow up. Admitted 5/17 with  Anemia.  Had push endoscopy wit small AVMs and one larger AVM. He was dischaged on 5/20 to Inpatient Rehab.   He returns for HF follow up. Had follow up with PCP. Hemoglobin back down 8.1>7.2 received 2UPRBCs. Eliquis stopped by PCP. Mild dyspnea walking. Denies PND/Orthopnea.  Weight at home 143 pounds. No BRBPR. No falls at home. Appetite ok.   Optivol- fluid well below threshold. Activity ~3 hours per day. No VT/AF  Echo 12/15 EF  ~40% inferior AK moderate to severe MR. RV mildly HK Echo 4/16  EF 40-45% moderate to severe MR RV moderate dysfunction RVSP 78  Labs 02/09/14 K 3.9 Creatinine 1.38 Pro BNP 2555  Labs 02/20/14 K 3.5 NA 127 Creatinine 1.6  03/01/14 TSH 19.8  T4  1.03 T3 84  Labs 03/03/14 CEA 9.0  K 4.3 creatinine 1.2 NA 134 Hgb 12.7 TSH 19.8 T3 84.9 T4 1.03  Labs 09/20/2014: K 3.3 Creatinine 2.05  Labs  09/27/2014: Na 124 K 3.6 Creatinine 1.88 Labs 10/18/2014: K 3.6 Creatinine 1.8  Labs 10/24/2014 K 4.4 Creatinine 2.2   CT Virtual Colonoscopy- Diverticulosis ? Rectosigmoid Colon Ca  ROS: All systems negative except as listed in HPI, PMH and Problem List.  Past Medical History  Diagnosis Date  . VENTRICULAR TACHYCARDIA   . PERIPHERAL VASCULAR DISEASE   . HYPERTENSION     Hypertensive retinopathy-grade II OU  . HYPERCHOLESTEROLEMIA   . CORONARY ARTERY DISEASE     Hx of CABG  . Chronic systolic heart failure     Ischemic cardiomyopathy (01/2014 EF 40-45% with hypokinesis + small area of akinesis  . SEBORRHEIC DERMATITIS   . Chronic renal insufficiency, stage III (moderate)     CrCl 40s-50s  . DIVERTICULOSIS OF COLON   . COPD, severe   . COLONIC POLYPS   . ANXIETY   . ICD (implantable cardiac defibrillator) in place   . Type II diabetes mellitus     +background diabetic retinopathy OU  . Chronic lower back pain   . DJD (degenerative joint disease)     "hands" (05/24/2012)  . Osteoarthritis of finger   . History of atrial flutter   . Thrombus 12/2013    on ICD lead--started anticoag and his cardioversion for atrial flutter was postponed.  . Colon stricture 2015    with  features worrisome for mass, also with recent rising CEA level (possible colon cancer)-GI MD= Dr. Franki Cabot recent eval/follow up with Dr. Earlean Shawl was summer 2014, at which time he recommended colonoscopy but pt declined.  . Macular degeneration, age related, nonexudative     OU  . Chronic blood loss anemia     Transfused 2 U pRBCs April, May, and June (10/26/14) of 2016    Current Outpatient Prescriptions  Medication Sig Dispense Refill  . amiodarone (PACERONE) 200 MG tablet Take 1 tablet (200 mg total) by mouth daily. 30 tablet 0  . carvedilol (COREG) 3.125 MG tablet Take 1 tablet (3.125 mg total) by mouth 2 (two) times daily with a meal. 60 tablet 0  . fexofenadine (ALLEGRA) 180 MG tablet Take 180 mg by mouth  daily as needed for allergies or rhinitis.    . Fluticasone-Salmeterol (ADVAIR) 500-50 MCG/DOSE AEPB Inhale 1 puff into the lungs 2 (two) times daily. 60 each 11  . folic acid (FOLVITE) 1 MG tablet Take 1 tablet (1 mg total) by mouth daily. 30 tablet 0  . guaiFENesin (MUCINEX) 600 MG 12 hr tablet Take 1 tablet (600 mg total) by mouth 2 (two) times daily.    . hydrALAZINE (APRESOLINE) 25 MG tablet Take 1 tablet (25 mg total) by mouth 3 (three) times daily. 90 tablet 1  . Insulin Glargine (LANTUS SOLOSTAR) 100 UNIT/ML Solostar Pen 10 units SQ qhs (Patient taking differently: Inject 10 Units into the skin at bedtime. ) 15 mL 11  . insulin lispro (HUMALOG KWIKPEN) 100 UNIT/ML KiwkPen Take 5 units in the morning, 5 units at noon, --if blood sugar over 100 and you eat > 50% of meals 5 pen 3  . isosorbide mononitrate (IMDUR) 30 MG 24 hr tablet Take 1 tablet (30 mg total) by mouth daily. 30 tablet 0  . levothyroxine (SYNTHROID, LEVOTHROID) 88 MCG tablet Take 1 tablet (88 mcg total) by mouth daily. 30 tablet 2  . pantoprazole (PROTONIX) 40 MG tablet Take 1 tablet (40 mg total) by mouth daily. 30 tablet 1  . potassium chloride SA (KLOR-CON M20) 20 MEQ tablet Take 1 tablet (20 mEq total) by mouth 2 (two) times daily. 60 tablet 1  . pravastatin (PRAVACHOL) 80 MG tablet Take 1 tablet (80 mg total) by mouth at bedtime. 30 tablet 0  . senna-docusate (SENOKOT-S) 8.6-50 MG per tablet Take 1 tablet by mouth at bedtime as needed for mild constipation. 100 tablet 1  . thiamine 100 MG tablet Take 1 tablet (100 mg total) by mouth daily. 30 tablet 0  . tiotropium (SPIRIVA HANDIHALER) 18 MCG inhalation capsule PLACE ONE CAPSULE INTO INHALER AND  INHALE  DAILY 30 capsule 0  . torsemide (DEMADEX) 20 MG tablet Take 2 tablets (40 mg total) by mouth daily. 60 tablet 0   No current facility-administered medications for this encounter.     PHYSICAL EXAM: Filed Vitals:   10/30/14 1520  BP: 98/48  Pulse: 57  Weight: 147 lb  12.8 oz (67.042 kg)  SpO2: 99%    General:  Frail appearing No resp difficulty. Wife present  HEENT: normal Neck: supple. JVP to jaw. No bruits. No lymphadenopathy or thryomegaly appreciated. Cor: PMI normal. Regular rate & rhythm. No rubs, gallops or murmurs. Lungs: clear Abdomen: soft, nontender, +distended. No hepatosplenomegaly. No bruits or masses. Good bowel sounds. Extremities: no cyanosis, clubbing, rash, R and LLE 2+ edema Neuro: alert & orientedx3, cranial nerves grossly intact. Moves all 4 extremities w/o difficulty. Affect pleasant.  ASSESSMENT & PLAN: Mr Garrel Ridgel is a 79 year old with ischemic cardiomyopathy and chronic systolic heart failure recently admitted with cardiogenic shock that required short term milrinone. 1. Chronic Systolic Heart Failure with R> L symptoms.  Ischemic cardiomyopathy. Has Medtronic ICD. ECHO 4/16 EF 40-45% RV moderated dilated. NYHA III-IIIB -Volume status elevated. Increase torsemide to 60 mg daily and continue metolazone to 2.5 mg every Monday and Friday.  Take extra metolazone tomorrow.  - Stop carvedilol.  - Continue Hydralazine 25 mg tid.  - He is not Ace or spiro in past due to CKD.   Discussed cardiomems device. Will submit for prior auth.  2. A fib/Aflutter: Maintaining SR.   - Continue amiodarone 200 mg daily. Recent LFTs ok. Will need yearly eye exams.  - Off Eliquis due to anemia/GI bleed.   3. Mitral regurgitation - moderate to severe due to restriction of posterior MV leaflet. Not candidate for TEE and possible repair.  4. CKD: Stable III. Check labs today.  5. DM: Per PCP.  6. CAD: s/p CABG.  Continue pravastatin.  Not on ASA with apixaban use.  7. COPD 8. H/o VT: He has an ICD and is now on amiodarone.  Stable 9. Hyponatremia 10. Anemia- 2 AVMs. Hgb down again last week 8.1>7.2. Received 2 UPRBCs last week. eliquis stopped . On PPI   CLEGG,AMY  NP-C 3:39 PM  Patient seen and examined with Darrick Grinder, NP. We discussed all  aspects of the encounter. I agree with the assessment and plan as stated above.   He remains weak but seems to be getting a little stronger. Agree with stopping anticoagulation with bleeding and falls. He is volume overloaded with R>>L heart failure.  Agree with adjusting diuretics. Follow renal function closely. Stop carvedilol with low BP.   Travis Mastel,MD 5:41 PM

## 2014-10-30 NOTE — Patient Instructions (Signed)
STOP Eliquis INCREASE Torsemide to 60 mg (3 tabs) daily RESTART Metolazone 2.5mg , one tab every Monday and Friday Please take an additional dose tomorrow 10/31/14, then resume regular dose on Friday 11/03/14 STOP Carvedilol  Labs today  Your physician recommends that you schedule a follow-up appointment in: 3 weeks  Do the following things EVERYDAY: 1) Weigh yourself in the morning before breakfast. Write it down and keep it in a log. 2) Take your medicines as prescribed 3) Eat low salt foods-Limit salt (sodium) to 2000 mg per day.  4) Stay as active as you can everyday 5) Limit all fluids for the day to less than 2 liters 6)

## 2014-10-30 NOTE — Telephone Encounter (Signed)
-----   Message from Irene Shipper, MD sent at 10/27/2014  1:09 PM EDT ----- Regarding: Capsule endoscopy results Please let the patient know that his capsule endoscopy showed 2 tiny nonbleeding AVMs in the mid small bowel. No new recommendations from a GI standpoint. Please let them know that I have shared this information with his cardiologist and primary care provider. Thank you

## 2014-10-30 NOTE — Telephone Encounter (Signed)
Pt aware.

## 2014-10-31 ENCOUNTER — Encounter: Payer: Self-pay | Admitting: Internal Medicine

## 2014-10-31 DIAGNOSIS — I5022 Chronic systolic (congestive) heart failure: Secondary | ICD-10-CM | POA: Insufficient documentation

## 2014-11-01 ENCOUNTER — Other Ambulatory Visit (INDEPENDENT_AMBULATORY_CARE_PROVIDER_SITE_OTHER): Payer: Medicare Other

## 2014-11-01 DIAGNOSIS — D500 Iron deficiency anemia secondary to blood loss (chronic): Secondary | ICD-10-CM | POA: Diagnosis not present

## 2014-11-01 LAB — CBC
HCT: 31.2 % — ABNORMAL LOW (ref 39.0–52.0)
HEMOGLOBIN: 10.2 g/dL — AB (ref 13.0–17.0)
MCHC: 32.8 g/dL (ref 30.0–36.0)
MCV: 88.8 fl (ref 78.0–100.0)
PLATELETS: 188 10*3/uL (ref 150.0–400.0)
RBC: 3.51 Mil/uL — ABNORMAL LOW (ref 4.22–5.81)
RDW: 16.9 % — AB (ref 11.5–15.5)
WBC: 7.8 10*3/uL (ref 4.0–10.5)

## 2014-11-01 LAB — BASIC METABOLIC PANEL
BUN: 37 mg/dL — ABNORMAL HIGH (ref 6–23)
CALCIUM: 8.6 mg/dL (ref 8.4–10.5)
CO2: 36 mEq/L — ABNORMAL HIGH (ref 19–32)
CREATININE: 1.94 mg/dL — AB (ref 0.40–1.50)
Chloride: 90 mEq/L — ABNORMAL LOW (ref 96–112)
GFR: 35.82 mL/min — ABNORMAL LOW (ref >60.00)
GLUCOSE: 145 mg/dL — AB (ref 70–99)
Potassium: 3.3 mEq/L — ABNORMAL LOW (ref 3.5–5.1)
Sodium: 134 mEq/L — ABNORMAL LOW (ref 135–145)

## 2014-11-02 ENCOUNTER — Encounter: Payer: Self-pay | Admitting: Family Medicine

## 2014-11-02 ENCOUNTER — Telehealth: Payer: Self-pay | Admitting: Family Medicine

## 2014-11-02 NOTE — Telephone Encounter (Signed)
NO need for the IV lasix.-thx

## 2014-11-02 NOTE — Telephone Encounter (Signed)
Rip Harbour from [facility] called and is requesting orders for [facility]. They are requesting orders to resume home health care visits; repeat CBC/CMP; IV lasix prn for weight gain.

## 2014-11-02 NOTE — Telephone Encounter (Signed)
Per Dr. Anitra Lauth okay to recheck labs in 10-14 days. Rip Harbour advised and voiced understanding. She stated that they can set up IV care for pts lasix if Dr. Anitra Lauth prefers. Please advise. Thanks.

## 2014-11-03 ENCOUNTER — Emergency Department (HOSPITAL_COMMUNITY): Payer: Medicare Other

## 2014-11-03 ENCOUNTER — Encounter (HOSPITAL_COMMUNITY): Payer: Self-pay

## 2014-11-03 ENCOUNTER — Inpatient Hospital Stay (HOSPITAL_COMMUNITY)
Admission: EM | Admit: 2014-11-03 | Discharge: 2014-11-04 | DRG: 641 | Disposition: A | Discharge status: Home / Self Care | Payer: Medicare Other | Attending: Internal Medicine | Admitting: Internal Medicine

## 2014-11-03 DIAGNOSIS — R509 Fever, unspecified: Secondary | ICD-10-CM | POA: Diagnosis not present

## 2014-11-03 DIAGNOSIS — Z79899 Other long term (current) drug therapy: Secondary | ICD-10-CM

## 2014-11-03 DIAGNOSIS — Z950 Presence of cardiac pacemaker: Secondary | ICD-10-CM | POA: Diagnosis not present

## 2014-11-03 DIAGNOSIS — E11319 Type 2 diabetes mellitus with unspecified diabetic retinopathy without macular edema: Secondary | ICD-10-CM | POA: Diagnosis present

## 2014-11-03 DIAGNOSIS — I129 Hypertensive chronic kidney disease with stage 1 through stage 4 chronic kidney disease, or unspecified chronic kidney disease: Secondary | ICD-10-CM | POA: Diagnosis present

## 2014-11-03 DIAGNOSIS — I482 Chronic atrial fibrillation, unspecified: Secondary | ICD-10-CM | POA: Diagnosis present

## 2014-11-03 DIAGNOSIS — M1990 Unspecified osteoarthritis, unspecified site: Secondary | ICD-10-CM | POA: Diagnosis present

## 2014-11-03 DIAGNOSIS — Z794 Long term (current) use of insulin: Secondary | ICD-10-CM | POA: Diagnosis not present

## 2014-11-03 DIAGNOSIS — E860 Dehydration: Secondary | ICD-10-CM | POA: Diagnosis present

## 2014-11-03 DIAGNOSIS — N189 Chronic kidney disease, unspecified: Secondary | ICD-10-CM

## 2014-11-03 DIAGNOSIS — I495 Sick sinus syndrome: Secondary | ICD-10-CM

## 2014-11-03 DIAGNOSIS — Z87891 Personal history of nicotine dependence: Secondary | ICD-10-CM | POA: Diagnosis not present

## 2014-11-03 DIAGNOSIS — I255 Ischemic cardiomyopathy: Secondary | ICD-10-CM | POA: Diagnosis present

## 2014-11-03 DIAGNOSIS — R4182 Altered mental status, unspecified: Secondary | ICD-10-CM | POA: Diagnosis present

## 2014-11-03 DIAGNOSIS — R404 Transient alteration of awareness: Secondary | ICD-10-CM | POA: Diagnosis not present

## 2014-11-03 DIAGNOSIS — E871 Hypo-osmolality and hyponatremia: Secondary | ICD-10-CM

## 2014-11-03 DIAGNOSIS — F419 Anxiety disorder, unspecified: Secondary | ICD-10-CM | POA: Diagnosis present

## 2014-11-03 DIAGNOSIS — N183 Chronic kidney disease, stage 3 unspecified: Secondary | ICD-10-CM | POA: Diagnosis present

## 2014-11-03 DIAGNOSIS — J449 Chronic obstructive pulmonary disease, unspecified: Secondary | ICD-10-CM | POA: Diagnosis present

## 2014-11-03 DIAGNOSIS — R531 Weakness: Secondary | ICD-10-CM

## 2014-11-03 DIAGNOSIS — E869 Volume depletion, unspecified: Secondary | ICD-10-CM

## 2014-11-03 DIAGNOSIS — E780 Pure hypercholesterolemia: Secondary | ICD-10-CM | POA: Diagnosis present

## 2014-11-03 DIAGNOSIS — E876 Hypokalemia: Secondary | ICD-10-CM

## 2014-11-03 DIAGNOSIS — I2510 Atherosclerotic heart disease of native coronary artery without angina pectoris: Secondary | ICD-10-CM | POA: Diagnosis present

## 2014-11-03 DIAGNOSIS — I5022 Chronic systolic (congestive) heart failure: Secondary | ICD-10-CM | POA: Diagnosis present

## 2014-11-03 DIAGNOSIS — N179 Acute kidney failure, unspecified: Secondary | ICD-10-CM | POA: Diagnosis present

## 2014-11-03 DIAGNOSIS — Z951 Presence of aortocoronary bypass graft: Secondary | ICD-10-CM

## 2014-11-03 DIAGNOSIS — I739 Peripheral vascular disease, unspecified: Secondary | ICD-10-CM | POA: Diagnosis present

## 2014-11-03 DIAGNOSIS — Z7901 Long term (current) use of anticoagulants: Secondary | ICD-10-CM | POA: Diagnosis not present

## 2014-11-03 LAB — COMPREHENSIVE METABOLIC PANEL
ALBUMIN: 2.7 g/dL — AB (ref 3.5–5.0)
ALT: 20 U/L (ref 17–63)
ANION GAP: 13 (ref 5–15)
AST: 52 U/L — ABNORMAL HIGH (ref 15–41)
Alkaline Phosphatase: 95 U/L (ref 38–126)
BUN: 37 mg/dL — ABNORMAL HIGH (ref 6–20)
CO2: 32 mmol/L (ref 22–32)
CREATININE: 2.18 mg/dL — AB (ref 0.61–1.24)
Calcium: 8.2 mg/dL — ABNORMAL LOW (ref 8.9–10.3)
Chloride: 87 mmol/L — ABNORMAL LOW (ref 101–111)
GFR calc non Af Amer: 27 mL/min — ABNORMAL LOW (ref >60)
GFR, EST AFRICAN AMERICAN: 32 mL/min — AB (ref >60)
GLUCOSE: 128 mg/dL — AB (ref 65–99)
POTASSIUM: 2.8 mmol/L — AB (ref 3.5–5.1)
Sodium: 132 mmol/L — ABNORMAL LOW (ref 135–145)
TOTAL PROTEIN: 6.3 g/dL — AB (ref 6.5–8.1)
Total Bilirubin: 1.7 mg/dL — ABNORMAL HIGH (ref 0.3–1.2)

## 2014-11-03 LAB — CBC WITH DIFFERENTIAL/PLATELET
BASOS PCT: 0 % (ref 0–1)
Basophils Absolute: 0 10*3/uL (ref 0.0–0.1)
EOS ABS: 0.1 10*3/uL (ref 0.0–0.7)
Eosinophils Relative: 1 % (ref 0–5)
HEMATOCRIT: 31.9 % — AB (ref 39.0–52.0)
Hemoglobin: 10.9 g/dL — ABNORMAL LOW (ref 13.0–17.0)
LYMPHS ABS: 1.8 10*3/uL (ref 0.7–4.0)
Lymphocytes Relative: 14 % (ref 12–46)
MCH: 29.5 pg (ref 26.0–34.0)
MCHC: 34.2 g/dL (ref 30.0–36.0)
MCV: 86.4 fL (ref 78.0–100.0)
MONO ABS: 1.8 10*3/uL — AB (ref 0.1–1.0)
Monocytes Relative: 14 % — ABNORMAL HIGH (ref 3–12)
Neutro Abs: 9.3 10*3/uL — ABNORMAL HIGH (ref 1.7–7.7)
Neutrophils Relative %: 71 % (ref 43–77)
Platelets: 174 10*3/uL (ref 150–400)
RBC: 3.69 MIL/uL — AB (ref 4.22–5.81)
RDW: 15.5 % (ref 11.5–15.5)
WBC: 13 10*3/uL — ABNORMAL HIGH (ref 4.0–10.5)

## 2014-11-03 LAB — I-STAT ARTERIAL BLOOD GAS, ED
ACID-BASE EXCESS: 12 mmol/L — AB (ref 0.0–2.0)
Bicarbonate: 34.9 mEq/L — ABNORMAL HIGH (ref 20.0–24.0)
O2 SAT: 93 %
PH ART: 7.549 — AB (ref 7.350–7.450)
PO2 ART: 62 mmHg — AB (ref 80.0–100.0)
Patient temperature: 100.8
TCO2: 36 mmol/L (ref 0–100)
pCO2 arterial: 40.4 mmHg (ref 35.0–45.0)

## 2014-11-03 LAB — PROTIME-INR
INR: 1.21 (ref 0.00–1.49)
Prothrombin Time: 15.5 seconds — ABNORMAL HIGH (ref 11.6–15.2)

## 2014-11-03 LAB — ETHANOL

## 2014-11-03 LAB — I-STAT TROPONIN, ED: Troponin i, poc: 0.02 ng/mL (ref 0.00–0.08)

## 2014-11-03 LAB — RAPID URINE DRUG SCREEN, HOSP PERFORMED
Amphetamines: NOT DETECTED
Barbiturates: NOT DETECTED
Benzodiazepines: NOT DETECTED
COCAINE: NOT DETECTED
OPIATES: NOT DETECTED
Tetrahydrocannabinol: NOT DETECTED

## 2014-11-03 LAB — URINALYSIS, ROUTINE W REFLEX MICROSCOPIC
BILIRUBIN URINE: NEGATIVE
GLUCOSE, UA: NEGATIVE mg/dL
Hgb urine dipstick: NEGATIVE
Ketones, ur: NEGATIVE mg/dL
Leukocytes, UA: NEGATIVE
NITRITE: NEGATIVE
Protein, ur: NEGATIVE mg/dL
SPECIFIC GRAVITY, URINE: 1.007 (ref 1.005–1.030)
Urobilinogen, UA: 1 mg/dL (ref 0.0–1.0)
pH: 7 (ref 5.0–8.0)

## 2014-11-03 LAB — CBG MONITORING, ED: Glucose-Capillary: 132 mg/dL — ABNORMAL HIGH (ref 65–99)

## 2014-11-03 LAB — I-STAT CG4 LACTIC ACID, ED: Lactic Acid, Venous: 2.02 mmol/L (ref 0.5–2.0)

## 2014-11-03 MED ORDER — SODIUM CHLORIDE 0.9 % IV SOLN
1000.0000 mL | Freq: Once | INTRAVENOUS | Status: AC
  Administered 2014-11-03: 1000 mL via INTRAVENOUS

## 2014-11-03 MED ORDER — GUAIFENESIN ER 600 MG PO TB12
600.0000 mg | ORAL_TABLET | Freq: Two times a day (BID) | ORAL | Status: DC
  Administered 2014-11-04: 600 mg via ORAL
  Filled 2014-11-03 (×3): qty 1

## 2014-11-03 MED ORDER — ONDANSETRON HCL 4 MG PO TABS
4.0000 mg | ORAL_TABLET | Freq: Four times a day (QID) | ORAL | Status: DC | PRN
Start: 2014-11-03 — End: 2014-11-04

## 2014-11-03 MED ORDER — ACETAMINOPHEN 650 MG RE SUPP: 650.0000 mg | Freq: Four times a day (QID) | RECTAL | Status: DC | PRN

## 2014-11-03 MED ORDER — SODIUM CHLORIDE 0.9 % IJ SOLN: 3.0000 mL | Freq: Two times a day (BID) | INTRAMUSCULAR | Status: DC

## 2014-11-03 MED ORDER — POTASSIUM CHLORIDE 10 MEQ/100ML IV SOLN
10.0000 meq | INTRAVENOUS | Status: AC
  Administered 2014-11-03 (×2): 10 meq via INTRAVENOUS
  Filled 2014-11-03 (×3): qty 100

## 2014-11-03 MED ORDER — PANTOPRAZOLE SODIUM 40 MG PO TBEC: 40.0000 mg | DELAYED_RELEASE_TABLET | Freq: Every day | ORAL | Status: DC

## 2014-11-03 MED ORDER — AMIODARONE HCL 200 MG PO TABS
200.0000 mg | ORAL_TABLET | Freq: Every day | ORAL | Status: DC
  Filled 2014-11-03: qty 1

## 2014-11-03 MED ORDER — SODIUM CHLORIDE 0.9 % IV BOLUS (SEPSIS)
1000.0000 mL | Freq: Once | INTRAVENOUS | Status: AC
Start: 2014-11-03 — End: 2014-11-03
  Administered 2014-11-03: 1000 mL via INTRAVENOUS

## 2014-11-03 MED ORDER — INSULIN ASPART 100 UNIT/ML ~~LOC~~ SOLN: 0.0000 [IU] | Freq: Every day | SUBCUTANEOUS | Status: DC

## 2014-11-03 MED ORDER — SODIUM CHLORIDE 0.9 % IV SOLN
1000.0000 mL | INTRAVENOUS | Status: DC
  Administered 2014-11-03 – 2014-11-04 (×2): 1000 mL via INTRAVENOUS

## 2014-11-03 MED ORDER — HYDRALAZINE HCL 25 MG PO TABS
25.0000 mg | ORAL_TABLET | Freq: Three times a day (TID) | ORAL | Status: DC
  Administered 2014-11-04: 25 mg via ORAL
  Filled 2014-11-03 (×5): qty 1

## 2014-11-03 MED ORDER — MOMETASONE FURO-FORMOTEROL FUM 200-5 MCG/ACT IN AERO
2.0000 | INHALATION_SPRAY | Freq: Two times a day (BID) | RESPIRATORY_TRACT | Status: DC
  Administered 2014-11-04: 2 via RESPIRATORY_TRACT
  Filled 2014-11-03: qty 8.8

## 2014-11-03 MED ORDER — HEPARIN SODIUM (PORCINE) 5000 UNIT/ML IJ SOLN
5000.0000 [IU] | Freq: Three times a day (TID) | INTRAMUSCULAR | Status: DC
  Filled 2014-11-03 (×3): qty 1

## 2014-11-03 MED ORDER — ONDANSETRON HCL 4 MG/2ML IJ SOLN: 4.0000 mg | Freq: Four times a day (QID) | INTRAMUSCULAR | Status: DC | PRN

## 2014-11-03 MED ORDER — LEVOTHYROXINE SODIUM 88 MCG PO TABS
88.0000 ug | ORAL_TABLET | Freq: Every day | ORAL | Status: DC
  Administered 2014-11-04: 88 ug via ORAL
  Filled 2014-11-03 (×2): qty 1

## 2014-11-03 MED ORDER — ACETAMINOPHEN 325 MG PO TABS: 650.0000 mg | ORAL_TABLET | Freq: Four times a day (QID) | ORAL | Status: DC | PRN

## 2014-11-03 MED ORDER — POTASSIUM CHLORIDE 20 MEQ/15ML (10%) PO SOLN
40.0000 meq | Freq: Once | ORAL | Status: AC
  Administered 2014-11-03: 40 meq via ORAL
  Filled 2014-11-03: qty 30

## 2014-11-03 MED ORDER — PIPERACILLIN-TAZOBACTAM 3.375 G IVPB
3.3750 g | Freq: Three times a day (TID) | INTRAVENOUS | Status: DC
  Administered 2014-11-04: 3.375 g via INTRAVENOUS
  Filled 2014-11-03 (×3): qty 50

## 2014-11-03 MED ORDER — PIPERACILLIN-TAZOBACTAM 3.375 G IVPB
3.3750 g | Freq: Once | INTRAVENOUS | Status: AC
  Administered 2014-11-03: 3.375 g via INTRAVENOUS
  Filled 2014-11-03: qty 50

## 2014-11-03 MED ORDER — INSULIN ASPART 100 UNIT/ML ~~LOC~~ SOLN: 0.0000 [IU] | Freq: Three times a day (TID) | SUBCUTANEOUS | Status: DC

## 2014-11-03 MED ORDER — ZOLPIDEM TARTRATE 5 MG PO TABS: 5.0000 mg | ORAL_TABLET | Freq: Every evening | ORAL | Status: DC | PRN

## 2014-11-03 MED ORDER — POTASSIUM CHLORIDE CRYS ER 20 MEQ PO TBCR
20.0000 meq | EXTENDED_RELEASE_TABLET | Freq: Two times a day (BID) | ORAL | Status: DC
  Administered 2014-11-04: 20 meq via ORAL
  Filled 2014-11-03 (×3): qty 1

## 2014-11-03 MED ORDER — PRAVASTATIN SODIUM 80 MG PO TABS
80.0000 mg | ORAL_TABLET | Freq: Every day | ORAL | Status: DC
  Administered 2014-11-04: 80 mg via ORAL
  Filled 2014-11-03 (×2): qty 1

## 2014-11-03 MED ORDER — TORSEMIDE 20 MG PO TABS
60.0000 mg | ORAL_TABLET | Freq: Every day | ORAL | Status: DC
  Filled 2014-11-03: qty 3

## 2014-11-03 MED ORDER — ISOSORBIDE MONONITRATE ER 30 MG PO TB24
30.0000 mg | ORAL_TABLET | Freq: Every day | ORAL | Status: DC
  Filled 2014-11-03: qty 1

## 2014-11-03 MED ORDER — VANCOMYCIN HCL IN DEXTROSE 1-5 GM/200ML-% IV SOLN
1000.0000 mg | Freq: Once | INTRAVENOUS | Status: AC
  Administered 2014-11-03: 1000 mg via INTRAVENOUS
  Filled 2014-11-03: qty 200

## 2014-11-03 MED ORDER — TIOTROPIUM BROMIDE MONOHYDRATE 18 MCG IN CAPS
18.0000 ug | ORAL_CAPSULE | Freq: Every day | RESPIRATORY_TRACT | Status: DC
  Administered 2014-11-04: 18 ug via RESPIRATORY_TRACT
  Filled 2014-11-03: qty 5

## 2014-11-03 MED ORDER — FOLIC ACID 1 MG PO TABS
1.0000 mg | ORAL_TABLET | Freq: Every day | ORAL | Status: DC
  Filled 2014-11-03: qty 1

## 2014-11-03 MED ORDER — SENNOSIDES-DOCUSATE SODIUM 8.6-50 MG PO TABS
1.0000 | ORAL_TABLET | Freq: Every evening | ORAL | Status: DC | PRN
  Filled 2014-11-03: qty 1

## 2014-11-03 MED ORDER — POTASSIUM CHLORIDE IN NACL 20-0.9 MEQ/L-% IV SOLN
INTRAVENOUS | Status: DC
  Filled 2014-11-03 (×2): qty 1000

## 2014-11-03 NOTE — ED Notes (Signed)
Patient transported to CT 

## 2014-11-03 NOTE — ED Notes (Signed)
Admitting MD at bedside.

## 2014-11-03 NOTE — ED Notes (Signed)
Attempted to call report. Floor RN unable to accept report.  

## 2014-11-03 NOTE — ED Notes (Signed)
PER EMS: pt found "confused" on his back deck of his house by neighbors around 1800 this evening. Pt was altered upon EMS arrival. Pt states he thinks he went out to his back deck around 2 or 2:30 this afternoon. Pt alert but confused at this time. Initial BP-92/40, EMS gave 500cc normal saline BP increased to 127/57, HR-68, RR-14.

## 2014-11-03 NOTE — ED Notes (Signed)
Family updated.

## 2014-11-03 NOTE — Telephone Encounter (Signed)
Left message for Rip Harbour advising no need to IV lasix at this time.

## 2014-11-03 NOTE — H&P (Signed)
[facility]                                                                                    Patient Demographics  [name], is a 79 y.o. male  CSN: [id number]  MRN: [id number]  DOB - 03-16-1936  Admit Date - 11/03/2014  Outpatient Primary MD for the patient is Tammi Sou, MD   With History of -  Past Medical History  Diagnosis Date  . VENTRICULAR TACHYCARDIA   . PERIPHERAL VASCULAR DISEASE   . HYPERTENSION     Hypertensive retinopathy-grade II OU  . HYPERCHOLESTEROLEMIA   . CORONARY ARTERY DISEASE     Hx of CABG  . Chronic systolic heart failure     Ischemic cardiomyopathy (01/2014 EF 40-45% with hypokinesis + small area of akinesis  . SEBORRHEIC DERMATITIS   . Chronic renal insufficiency, stage III (moderate)     CrCl 40s-50s  . DIVERTICULOSIS OF COLON   . COPD, severe   . COLONIC POLYPS   . ANXIETY   . ICD (implantable cardiac defibrillator) in place   . Type II diabetes mellitus     +background diabetic retinopathy OU  . Chronic lower back pain   . DJD (degenerative joint disease)     "hands" (05/24/2012)  . Osteoarthritis of finger   . History of atrial flutter   . Thrombus 12/2013    on ICD lead--started anticoag and his cardioversion for atrial flutter was postponed.  . Colon stricture 2015    with features worrisome for mass, also with recent rising CEA level (possible colon cancer)-GI MD= Dr. Jackelyn Hoehn has been declining colonoscopy since that time.  Dr. Liliane Channel impression on re-eval in 2015 was that the area represented chronic post-diverticulitis changes, not malignancy.  . Macular degeneration, age related, nonexudative     OU  . Chronic blood loss anemia     Transfused 2 U pRBCs April, May, and June (10/26/14) of 2016.  GI w/u has revealed only 2 tiny non-bleeding AVMs in mid small bowel.      Past Surgical History  Procedure Laterality Date  . Coronary artery bypass graft  1990's    CABG X4  . Tonsillectomy  ?1942  .  Cardiac defibrillator placement  12/2004    single chamber defibrillator- Medtronic Maxima 8/06 DrKlein [Other][  . Tee without cardioversion N/A 12/22/2013    Procedure: TRANSESOPHAGEAL ECHOCARDIOGRAM (TEE);  Surgeon: Thayer Headings, MD;  Location: [place];  Service: Cardiovascular;  Laterality: N/A;  . Cardioversion N/A 12/22/2013    Procedure: CARDIOVERSION;  Surgeon: Thayer Headings, MD;  Location: [facility] ENDOSCOPY;  Service: Cardiovascular;  Laterality: N/A;  . Cardiovascular stress test  01/12/13    myocard perf imaging: previous infarct with a moderately reduced EF as was known previously.  No new findings.   . Gastric emptying scan  02/02/14    Normal  . Transthoracic echocardiogram  01/25/14; 04/2014    EF 40-45%, diffuse hypokinesis, infero-basilar myocardial akinesis, PA pressure slightly high, valves fine.04/2014-Echo today EF ~40% inferior AK moderate to severe MR  . Abdominal ultrasound  01/2014    GB sludge, o/w normal  . Ct virtual colonoscopy  diagnostic  11/2012; repeat 09/2014    Asymmetric thickening of the rectosigmoid colon worrisome for colon cancer, but further eval by pt's GI MD led to conclusion that this was likely sequela of recurrent diverticulitis.  Pt has refused colonoscopy to evaluate this region.  . Cardiovascular stress test      Lexiscan: There is a medium sized fixed inferior wall defect of moderate severity involving the apical inferior and mid inferior and basal inferoseptal segments, consistent with old inferior MI. No significant reversible ischemia.  EF 34%, inferior wall hypokinesis--intermediate risk scan (ok to do planned procedure)  . Implantable cardioverter defibrillator revision N/A 05/25/2012    Procedure: IMPLANTABLE CARDIOVERTER DEFIBRILLATOR REVISION;  Surgeon: Evans Lance, MD;  Location: [facility] CATH LAB;  Service: Cardiovascular;  Laterality: N/A;  . Right heart catheterization N/A 08/31/2014    Procedure: RIGHT HEART CATH;  Surgeon: Larey Dresser, MD;   Location: [facility] CATH LAB;  Service: Cardiovascular;  Laterality: N/A;  . Esophagogastroduodenoscopy N/A 09/03/2014    Candida esophagitis + retained gastric contents.  No bleeding. Procedure: ESOPHAGOGASTRODUODENOSCOPY (EGD);  Surgeon: Carol Ada, MD;  Location: [facility] ENDOSCOPY;  Service: Endoscopy;  Laterality: N/A;  . Givens capsule study N/A 10/12/2014    Procedure: GIVENS CAPSULE STUDY;  Surgeon: Irene Shipper, MD;  Location: [facility];  Service: Endoscopy;  Laterality: N/A;    in for   Chief Complaint  Patient presents with  . Heat Exposure     HPI  [name]  is a 79 y.o. male, with past medical history significant for coronary artery disease status post CABG, sick sinus syndrome status post pacemaker and congestive heart failure who was brought today by EMS after he was found by his neighbor on the porch with altered mental status . Patient denies preceding chest pains palpitations nausea or vomiting he reports laying down in the sun and went to sleep and thinks he might be some burned. He received IV fluids in the emergency room and already feels and looks better and he is been worked up for a fever noted on admission    Review of Systems    In addition to the HPI above,  No Headache, No changes with Vision or hearing, No problems swallowing food or Liquids, No Chest pain, Cough or Shortness of Breath, No Abdominal pain, No Nausea or Vommitting, Bowel movements are regular, No Blood in stool or Urine, No dysuria, No new skin rashes or bruises, No new joints pains-aches,  No new weakness, tingling, numbness in any extremity, No recent weight gain or loss, No polyuria, polydypsia or polyphagia, No significant Mental Stressors.  A full 10 point Review of Systems was done, except as stated above, all other Review of Systems were negative.   Social History History  Substance Use Topics  . Smoking status: Former Smoker -- 2.00 packs/day for 50 years    Types:  Cigarettes    Quit date: 06/24/2000  . Smokeless tobacco: Never Used  . Alcohol Use: 0.0 oz/week    0 Standard drinks or equivalent per week     Comment: 05/24/2012 "used to drink alot; quit > 10 yr ago"     Family History Family History  Problem Relation Age of Onset  . Heart attack Father 9  . Heart attack Brother     Vague history     Prior to Admission medications   Medication Sig Start Date End Date Taking? Authorizing Provider  amiodarone (PACERONE) 200 MG tablet Take 1 tablet (200  mg total) by mouth daily. 10/18/14  Yes Ivan Anchors Love, PA-C  Fluticasone-Salmeterol (ADVAIR) 500-50 MCG/DOSE AEPB Inhale 1 puff into the lungs 2 (two) times daily. 04/05/14  Yes Noralee Space, MD  folic acid (FOLVITE) 1 MG tablet Take 1 tablet (1 mg total) by mouth daily. 10/18/14  Yes Ivan Anchors Love, PA-C  guaiFENesin (MUCINEX) 600 MG 12 hr tablet Take 1 tablet (600 mg total) by mouth 2 (two) times daily. 10/13/14  Yes Barton Dubois, MD  hydrALAZINE (APRESOLINE) 25 MG tablet Take 1 tablet (25 mg total) by mouth 3 (three) times daily. 10/18/14  Yes Ivan Anchors Love, PA-C  Insulin Glargine (LANTUS SOLOSTAR) 100 UNIT/ML Solostar Pen 10 units SQ qhs Patient taking differently: Inject 10 Units into the skin at bedtime.  02/10/14  Yes Tammi Sou, MD  insulin lispro (HUMALOG KWIKPEN) 100 UNIT/ML KiwkPen Take 5 units in the morning, 5 units at noon, --if blood sugar over 100 and you eat > 50% of meals Patient taking differently: Inject 7-9 Units into the skin 3 (three) times daily. Take 7 units in the morning, 8 units at noon, and 9 units at dinner 10/18/14  Yes Ivan Anchors Love, PA-C  isosorbide mononitrate (IMDUR) 30 MG 24 hr tablet Take 1 tablet (30 mg total) by mouth daily. 10/18/14  Yes Ivan Anchors Love, PA-C  levothyroxine (SYNTHROID, LEVOTHROID) 88 MCG tablet Take 1 tablet (88 mcg total) by mouth daily. 10/25/14  Yes Tammi Sou, MD  metolazone (ZAROXOLYN) 2.5 MG tablet Take one tab every Monday and  Friday Patient taking differently: Take 2.5 mg by mouth 2 (two) times a week. Take one tab every Monday and Friday 10/30/14  Yes Jolaine Artist, MD  pantoprazole (PROTONIX) 40 MG tablet Take 1 tablet (40 mg total) by mouth daily. 10/18/14  Yes Ivan Anchors Love, PA-C  potassium chloride SA (KLOR-CON M20) 20 MEQ tablet Take 1 tablet (20 mEq total) by mouth 2 (two) times daily. 10/18/14  Yes Ivan Anchors Love, PA-C  pravastatin (PRAVACHOL) 80 MG tablet Take 1 tablet (80 mg total) by mouth at bedtime. 10/18/14  Yes Ivan Anchors Love, PA-C  senna-docusate (SENOKOT-S) 8.6-50 MG per tablet Take 1 tablet by mouth at bedtime as needed for mild constipation. 10/18/14  Yes Ivan Anchors Love, PA-C  tiotropium (SPIRIVA HANDIHALER) 18 MCG inhalation capsule PLACE ONE CAPSULE INTO INHALER AND  INHALE  DAILY Patient taking differently: Place 18 mcg into inhaler and inhale at bedtime.  10/18/14  Yes Ivan Anchors Love, PA-C  torsemide (DEMADEX) 20 MG tablet Take 3 tablets (60 mg total) by mouth daily. 10/30/14  Yes Jolaine Artist, MD  thiamine 100 MG tablet Take 1 tablet (100 mg total) by mouth daily. Patient not taking: Reported on 11/03/2014 09/14/14   Allie Bossier, MD    Allergies  Allergen Reactions  . Niacin Itching    Niaspan     Physical Exam  Vitals  Blood pressure 120/48, pulse 72, temperature 100.8 F (38.2 C), temperature source Rectal, resp. rate 21, SpO2 96 %.   1. General a very pleasant gentleman, well developed, well nourished in no significant distress  2. Normal affect and insight, Not Suicidal or Homicidal, Awake Alert, Oriented X 3.  3. No F.N deficits, ALL C.Nerves Intact,  4. Ears and Eyes appear Normal, Conjunctivae clear, PERRLA. Moist Oral Mucosa.  5. Supple Neck, No JVD, No cervical lymphadenopathy appriciated, No Carotid Bruits.  6. Symmetrical Chest wall movement, Good air movement bilaterally, CTAB.  7. RRR, No Gallops, Rubs or Murmurs, No Parasternal Heave.  8. Positive Bowel Sounds,  Abdomen Soft, Non tender, No organomegaly appriciated,No rebound -guarding or rigidity.  9.  No Cyanosis, Normal Skin Turgor, mild facial redness and erythema.  10. Good muscle tone,  joints appear normal , no effusions, Normal ROM.      Data Review  CBC  Recent Labs Lab 11/01/14 1110 11/03/14 1930  WBC 7.8 13.0*  HGB 10.2* 10.9*  HCT 31.2* 31.9*  PLT 188.0 174  MCV 88.8 86.4  MCH  --  29.5  MCHC 32.8 34.2  RDW 16.9* 15.5  LYMPHSABS  --  1.8  MONOABS  --  1.8*  EOSABS  --  0.1  BASOSABS  --  0.0   ------------------------------------------------------------------------------------------------------------------  Chemistries   Recent Labs Lab 10/30/14 1600 11/01/14 1110 11/03/14 1930  NA 131* 134* 132*  K 5.1 3.3* 2.8*  CL 94* 90* 87*  CO2 26 36* 32  GLUCOSE 168* 145* 128*  BUN 36* 37* 37*  CREATININE 2.15* 1.94* 2.18*  CALCIUM 8.5* 8.6 8.2*  AST  --   --  52*  ALT  --   --  20  ALKPHOS  --   --  95  BILITOT  --   --  1.7*   ------------------------------------------------------------------------------------------------------------------ estimated creatinine clearance is 26.5 mL/min (by C-G formula based on Cr of 2.18). ------------------------------------------------------------------------------------------------------------------ No results for input(s): TSH, T4TOTAL, T3FREE, THYROIDAB in the last 72 hours.  Invalid input(s): FREET3   Coagulation profile  Recent Labs Lab 11/03/14 1930  INR 1.21   ------------------------------------------------------------------------------------------------------------------- No results for input(s): DDIMER in the last 72 hours. -------------------------------------------------------------------------------------------------------------------  Cardiac Enzymes No results for input(s): CKMB, TROPONINI, MYOGLOBIN in the last 168 hours.  Invalid input(s):  CK ------------------------------------------------------------------------------------------------------------------ Invalid input(s): POCBNP   ---------------------------------------------------------------------------------------------------------------  Urinalysis    Component Value Date/Time   COLORURINE YELLOW 11/03/2014 1932   APPEARANCEUR CLEAR 11/03/2014 1932   LABSPEC 1.007 11/03/2014 1932   PHURINE 7.0 11/03/2014 1932   GLUCOSEU NEGATIVE 11/03/2014 1932   HGBUR NEGATIVE 11/03/2014 1932   BILIRUBINUR NEGATIVE 11/03/2014 1932   KETONESUR NEGATIVE 11/03/2014 1932   PROTEINUR NEGATIVE 11/03/2014 1932   UROBILINOGEN 1.0 11/03/2014 1932   NITRITE NEGATIVE 11/03/2014 1932   LEUKOCYTESUR NEGATIVE 11/03/2014 1932    ----------------------------------------------------------------------------------------------------------------   Imaging results:   Dg Chest 2 View  10/24/2014   CLINICAL DATA:  Follow-up pneumonia  EXAM: CHEST  2 VIEW  COMPARISON:  10/14/2014 and 10/09/2014  FINDINGS: Cardiomediastinal silhouette is stable. Status post CABG. Dual lead cardiac pacemaker is unchanged in position. Hyperinflation again noted. Again noted blunting of the right costophrenic angle. Stable bilateral pleural plaques and chronic interstitial prominence. No definite superimposed infiltrate or pulmonary edema. Stable mild degenerative changes lower thoracic spine.  IMPRESSION: Hyperinflation again noted. Again noted blunting of the right costophrenic angle. Stable bilateral pleural plaques and chronic interstitial prominence. No definite superimposed infiltrate or pulmonary edema.   Electronically Signed   By: Lahoma Crocker M.D.   On: 10/24/2014 17:01   Dg Chest 2 View  10/14/2014   CLINICAL DATA:  Hospital acquired pneumonia.  Ex-smoker.  EXAM: CHEST  2 VIEW  COMPARISON:  10/09/2014.  FINDINGS: Stable enlarged cardiac silhouette, post CABG changes and left subclavian and AICD leads. Stable  prominence of the interstitial markings and calcified pleural plaques. Small amount of bilateral pleural thickening or fluid. Interval mild patchy opacity in the right mid lung zone. The hemidiaphragms remain flattened.  IMPRESSION: 1. Interval mild  patchy opacity suspicious for pneumonia in the right mid lung zone. 2. Stable changes of COPD with interstitial fibrosis. 3. 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   Electronically Signed   By: Claudie Revering M.D.   On: 10/14/2014 10:29   Dg Chest 2 View  10/09/2014   CLINICAL DATA:  Weakness, shortness of breath.  History of COPD.  EXAM: CHEST  2 VIEW  COMPARISON:  09/11/2014  FINDINGS: Lungs are hyperinflated with emphysema. There are bilateral calcified pleural plaques. Dual lead left-sided pacemaker remains in place. Patient is post median sternotomy. Stable cardiomegaly. No pulmonary edema. Improved right upper lobe opacity compared to prior. Increased opacity at the right costophrenic angle may reflect new focal consolidation, atelectasis, or less likely pleural effusion. No left pleural effusion. No pneumothorax. No acute osseous abnormalities are seen.  IMPRESSION: Increasing opacity at the right costophrenic angle that is new/ progressed from prior exams and may reflect atelectasis, pneumonia or less likely pleural effusion.  Underlying chronic lung disease, bilateral pleural plaques and emphysema. Stable cardiomegaly.   Electronically Signed   By: Jeb Levering M.D.   On: 10/09/2014 23:23   Dg Thoracic Spine W/swimmers  10/09/2014   CLINICAL DATA:  Acute onset of chronic upper back pain. Initial encounter.  EXAM: THORACIC SPINE - 2 VIEW + SWIMMERS  COMPARISON:  Chest radiograph performed 09/11/2014  FINDINGS: There is no evidence of fracture or subluxation. Vertebral bodies demonstrate normal height and alignment. Mild degenerative change is noted along the cervical spine.  The visualized portions of both lungs are clear.  The mediastinum is borderline normal in size. The patient is status post median sternotomy, with evidence of prior CABG. AICD leads are noted.  IMPRESSION: No evidence of fracture or subluxation along the cervical spine.   Electronically Signed   By: Garald Balding M.D.   On: 10/09/2014 23:22   Dg Lumbar Spine 2-3 Views  10/09/2014   CLINICAL DATA:  Chronic lower back pain.  Initial encounter.  EXAM: LUMBAR SPINE - 2-3 VIEW  COMPARISON:  CT of the abdomen and pelvis performed 12/07/2012  FINDINGS: There is no evidence of acute fracture or subluxation. Vertebral bodies demonstrate normal height. There is grade 1 anterolisthesis of L3 on L4. Mild disc space narrowing is noted at L2-L3 and L5-S1. Associated endplate sclerotic change is noted.  The visualized bowel gas pattern is unremarkable in appearance; air and stool are noted within the colon. The sacroiliac joints are within normal limits. Scattered vascular calcifications are seen.  IMPRESSION: 1. No evidence of acute fracture or subluxation along the lumbar spine. 2. Mild degenerative change noted along the lumbar spine. 3. Scattered vascular calcifications seen.   Electronically Signed   By: Garald Balding M.D.   On: 10/09/2014 23:24   Ct Head Wo Contrast  11/03/2014   CLINICAL DATA:  Acute confusion.  Possible heat exposure.  EXAM: CT HEAD WITHOUT CONTRAST  TECHNIQUE: Contiguous axial images were obtained from the base of the skull through the vertex without intravenous contrast.  COMPARISON:  10/09/2014  FINDINGS: The brain shows mild generalized atrophy. There is mild small vessel change of the white matter. No sign of acute infarction, mass lesion, hemorrhage, hydrocephalus or extra-axial collection. No calvarial abnormality. Sinuses are clear. There is atherosclerotic calcification of the major vessels at the base of the brain.  IMPRESSION: No acute or significant finding. Mild atrophy and small-vessel change.   Electronically Signed   By: Jan Fireman.D.  On: 11/03/2014 20:21   Ct Head Wo Contrast  10/09/2014   CLINICAL DATA:  Acute onset of generalized weakness. Back pain. Frequent falls. Worsening confusion. Initial encounter.  EXAM: CT HEAD WITHOUT CONTRAST  TECHNIQUE: Contiguous axial images were obtained from the base of the skull through the vertex without intravenous contrast.  COMPARISON:  CT of the neck performed 03/11/2007  FINDINGS: There is no evidence of acute infarction, mass lesion, or intra- or extra-axial hemorrhage on CT.  Prominence of the sulci suggests mild cortical volume loss. Mild cerebellar atrophy is noted. Mild periventricular white matter change likely reflects small vessel ischemic microangiopathy.  The brainstem and fourth ventricle are within normal limits. The third and lateral ventricles, and basal ganglia are unremarkable in appearance. The cerebral hemispheres are symmetric in appearance, with normal gray-white differentiation. No mass effect or midline shift is seen.  There is no evidence of fracture; visualized osseous structures are unremarkable in appearance. The orbits are within normal limits. The paranasal sinuses and mastoid air cells are well-aerated. No significant soft tissue abnormalities are seen.  IMPRESSION: 1. No acute intracranial pathology seen on CT. 2. Mild cortical volume loss and scattered small vessel ischemic microangiopathy.   Electronically Signed   By: Garald Balding M.D.   On: 10/09/2014 23:46   Dg Chest Port 1 View  11/03/2014   CLINICAL DATA:  Confusion today. Weakness. History of diabetes and COPD. Initial encounter.  EXAM: PORTABLE CHEST - 1 VIEW  COMPARISON:  10/24/2014 and 10/14/2014 radiographs. PET-CT 05/20/2013.  FINDINGS: 1940 hours. The left subclavian AICD leads appear unchanged within the right ventricle. There is stable cardiomegaly status post median sternotomy and CABG. There is chronic calcified pleural thickening bilaterally, worse on the left. No superimposed  airspace disease, edema or significant pleural effusion identified. The bones appear unchanged.  IMPRESSION: Stable postoperative chest with calcified pleural plaque formation bilaterally and cardiomegaly. No acute cardiopulmonary process.   Electronically Signed   By: Richardean Sale M.D.   On: 11/03/2014 19:57    My personal review of EKG: Normal sinus rhythm with increased PR interval and left bundle branch block with pacemaker spikes.    Assessment & Plan  1. Altered mental status with low-grade fever; probably from sun exposure 2. History of coronary artery disease status post CABGx4 3. History of sick sinus syndrome status post pacemaker placement in 2006 4. Congestive heart failure; systolic ejection fraction 40-45% 5. Hyponatremia and hypokalemia  Plan  Admit to telemetry Serial troponin Pancultures Will start IV Zosyn only IV fluids normal saline with potassium Hold IV Zaroxolyn and Demadex tonight and check BMP in a.m. and treat accordingly.   DVT Prophylaxis Heparin   AM Labs Ordered, also please review Full Orders  Code Status full  Disposition Plan: Home  Time spent in minutes : 34 minutes  Condition GUARDED   @SIGNATURE @

## 2014-11-03 NOTE — ED Provider Notes (Signed)
CSN: [id number]     Arrival date & time 11/03/14  1912 History   First MD Initiated Contact with Patient 11/03/14 1917     Chief Complaint  Patient presents with  . Heat Exposure    Level V caveat secondary to confusion. History obtained from EMS. (Consider location/radiation/quality/duration/timing/severity/associated sxs/prior Treatment) HPI 79 year old male who is brought in today by EMS. They report that he was found on his back porch by a neighbor. They report that he has been confused. He states that he went outside and laid in the sun. They're reports that he was very warm to touch on their arrival. He was confused. There was no sweating noted. He was given some IV fluids and route. He denies any pain anywhere. He denies any current complaints.  Discharge Diagnoses:  Principal Problem:   Physical deconditioning Active Problems:   Chronic systolic CHF (congestive heart failure), NYHA class 4   Type 2 diabetes mellitus, uncontrolled   CKD (chronic kidney disease) stage 3, GFR 30-59 ml/min   HCAP (healthcare-associated pneumonia)   Cardiomyopathy, ischemic   GI bleed   Chronic atrial fibrillation   Hospital acquired PNA  History obtained from the last discharge on May 25 as below Admit date: 10/13/2014 Discharge date: 10/18/2014  Discharge Diagnoses:  Principal Problem:   Physical deconditioning Active Problems:   Chronic systolic CHF (congestive heart failure), NYHA class 4   Type 2 diabetes mellitus, uncontrolled   CKD (chronic kidney disease) stage 3, GFR 30-59 ml/min   HCAP (healthcare-associated pneumonia)   Cardiomyopathy, ischemic   GI bleed   Chronic atrial fibrillation   Hospital acquired PNA     Past Medical History  Diagnosis Date  . VENTRICULAR TACHYCARDIA   . PERIPHERAL VASCULAR DISEASE   . HYPERTENSION     Hypertensive retinopathy-grade II OU  . HYPERCHOLESTEROLEMIA   . CORONARY ARTERY DISEASE     Hx of CABG  . Chronic systolic heart failure      Ischemic cardiomyopathy (01/2014 EF 40-45% with hypokinesis + small area of akinesis  . SEBORRHEIC DERMATITIS   . Chronic renal insufficiency, stage III (moderate)     CrCl 40s-50s  . DIVERTICULOSIS OF COLON   . COPD, severe   . COLONIC POLYPS   . ANXIETY   . ICD (implantable cardiac defibrillator) in place   . Type II diabetes mellitus     +background diabetic retinopathy OU  . Chronic lower back pain   . DJD (degenerative joint disease)     "hands" (05/24/2012)  . Osteoarthritis of finger   . History of atrial flutter   . Thrombus 12/2013    on ICD lead--started anticoag and his cardioversion for atrial flutter was postponed.  . Colon stricture 2015    with features worrisome for mass, also with recent rising CEA level (possible colon cancer)-GI MD= Dr. Jackelyn Hoehn has been declining colonoscopy since that time.  Dr. Liliane Channel impression on re-eval in 2015 was that the area represented chronic post-diverticulitis changes, not malignancy.  . Macular degeneration, age related, nonexudative     OU  . Chronic blood loss anemia     Transfused 2 U pRBCs April, May, and June (10/26/14) of 2016.  GI w/u has revealed only 2 tiny non-bleeding AVMs in mid small bowel.   Past Surgical History  Procedure Laterality Date  . Coronary artery bypass graft  1990's    CABG X4  . Tonsillectomy  ?1942  . Cardiac defibrillator placement  12/2004    single chamber  defibrillator- Medtronic Maxima 8/06 DrKlein [Other][  . Tee without cardioversion N/A 12/22/2013    Procedure: TRANSESOPHAGEAL ECHOCARDIOGRAM (TEE);  Surgeon: Thayer Headings, MD;  Location: [place];  Service: Cardiovascular;  Laterality: N/A;  . Cardioversion N/A 12/22/2013    Procedure: CARDIOVERSION;  Surgeon: Thayer Headings, MD;  Location: [facility] ENDOSCOPY;  Service: Cardiovascular;  Laterality: N/A;  . Cardiovascular stress test  01/12/13    myocard perf imaging: previous infarct with a moderately reduced EF as was known previously.  No new  findings.   . Gastric emptying scan  02/02/14    Normal  . Transthoracic echocardiogram  01/25/14; 04/2014    EF 40-45%, diffuse hypokinesis, infero-basilar myocardial akinesis, PA pressure slightly high, valves fine.04/2014-Echo today EF ~40% inferior AK moderate to severe MR  . Abdominal ultrasound  01/2014    GB sludge, o/w normal  . Ct virtual colonoscopy diagnostic  11/2012; repeat 09/2014    Asymmetric thickening of the rectosigmoid colon worrisome for colon cancer, but further eval by pt's GI MD led to conclusion that this was likely sequela of recurrent diverticulitis.  Pt has refused colonoscopy to evaluate this region.  . Cardiovascular stress test      Lexiscan: There is a medium sized fixed inferior wall defect of moderate severity involving the apical inferior and mid inferior and basal inferoseptal segments, consistent with old inferior MI. No significant reversible ischemia.  EF 34%, inferior wall hypokinesis--intermediate risk scan (ok to do planned procedure)  . Implantable cardioverter defibrillator revision N/A 05/25/2012    Procedure: IMPLANTABLE CARDIOVERTER DEFIBRILLATOR REVISION;  Surgeon: Evans Lance, MD;  Location: [facility] CATH LAB;  Service: Cardiovascular;  Laterality: N/A;  . Right heart catheterization N/A 08/31/2014    Procedure: RIGHT HEART CATH;  Surgeon: Larey Dresser, MD;  Location: [facility] CATH LAB;  Service: Cardiovascular;  Laterality: N/A;  . Esophagogastroduodenoscopy N/A 09/03/2014    Candida esophagitis + retained gastric contents.  No bleeding. Procedure: ESOPHAGOGASTRODUODENOSCOPY (EGD);  Surgeon: Carol Ada, MD;  Location: [facility] ENDOSCOPY;  Service: Endoscopy;  Laterality: N/A;  . Givens capsule study N/A 10/12/2014    Procedure: GIVENS CAPSULE STUDY;  Surgeon: Irene Shipper, MD;  Location: [place];  Service: Endoscopy;  Laterality: N/A;   Family History  Problem Relation Age of Onset  . Heart attack Father 82  . Heart attack Brother     Vague history    History  Substance Use Topics  . Smoking status: Former Smoker -- 2.00 packs/day for 50 years    Types: Cigarettes    Quit date: 06/24/2000  . Smokeless tobacco: Never Used  . Alcohol Use: 0.0 oz/week    0 Standard drinks or equivalent per week     Comment: 05/24/2012 "used to drink alot; quit > 10 yr ago"    Review of Systems  Unable to perform ROS     Allergies  Niacin  Home Medications   Prior to Admission medications   Medication Sig Start Date End Date Taking? Authorizing Provider  amiodarone (PACERONE) 200 MG tablet Take 1 tablet (200 mg total) by mouth daily. 10/18/14   Bary Leriche, PA-C  fexofenadine (ALLEGRA) 180 MG tablet Take 180 mg by mouth daily as needed for allergies or rhinitis.    Historical Provider, MD  Fluticasone-Salmeterol (ADVAIR) 500-50 MCG/DOSE AEPB Inhale 1 puff into the lungs 2 (two) times daily. 04/05/14   Noralee Space, MD  folic acid (FOLVITE) 1 MG tablet Take 1 tablet (1 mg total) by mouth  daily. 10/18/14   Ivan Anchors Love, PA-C  guaiFENesin (MUCINEX) 600 MG 12 hr tablet Take 1 tablet (600 mg total) by mouth 2 (two) times daily. 10/13/14   Barton Dubois, MD  hydrALAZINE (APRESOLINE) 25 MG tablet Take 1 tablet (25 mg total) by mouth 3 (three) times daily. 10/18/14   Bary Leriche, PA-C  Insulin Glargine (LANTUS SOLOSTAR) 100 UNIT/ML Solostar Pen 10 units SQ qhs Patient taking differently: Inject 10 Units into the skin at bedtime.  02/10/14   Tammi Sou, MD  insulin lispro (HUMALOG KWIKPEN) 100 UNIT/ML KiwkPen Take 5 units in the morning, 5 units at noon, --if blood sugar over 100 and you eat > 50% of meals 10/18/14   Ivan Anchors Love, PA-C  isosorbide mononitrate (IMDUR) 30 MG 24 hr tablet Take 1 tablet (30 mg total) by mouth daily. 10/18/14   Bary Leriche, PA-C  levothyroxine (SYNTHROID, LEVOTHROID) 88 MCG tablet Take 1 tablet (88 mcg total) by mouth daily. 10/25/14   Tammi Sou, MD  metolazone (ZAROXOLYN) 2.5 MG tablet Take one tab every Monday  and Friday 10/30/14   Jolaine Artist, MD  pantoprazole (PROTONIX) 40 MG tablet Take 1 tablet (40 mg total) by mouth daily. 10/18/14   Ivan Anchors Love, PA-C  potassium chloride SA (KLOR-CON M20) 20 MEQ tablet Take 1 tablet (20 mEq total) by mouth 2 (two) times daily. 10/18/14   Bary Leriche, PA-C  pravastatin (PRAVACHOL) 80 MG tablet Take 1 tablet (80 mg total) by mouth at bedtime. 10/18/14   Ivan Anchors Love, PA-C  senna-docusate (SENOKOT-S) 8.6-50 MG per tablet Take 1 tablet by mouth at bedtime as needed for mild constipation. 10/18/14   Bary Leriche, PA-C  thiamine 100 MG tablet Take 1 tablet (100 mg total) by mouth daily. 09/14/14   Allie Bossier, MD  tiotropium (SPIRIVA HANDIHALER) 18 MCG inhalation capsule PLACE ONE CAPSULE INTO INHALER AND  INHALE  DAILY 10/18/14   Ivan Anchors Love, PA-C  torsemide (DEMADEX) 20 MG tablet Take 3 tablets (60 mg total) by mouth daily. 10/30/14   Shaune Pascal Bensimhon, MD   BP 130/42 mmHg  Pulse 105  Temp(Src) 100.8 F (38.2 C) (Rectal)  Resp 20  SpO2 97% Physical Exam  Constitutional: He appears well-developed and well-nourished.  Skin is somewhat warm to touch.  HENT:  Head: Normocephalic and atraumatic.  Right Ear: External ear normal.  Mucous membranes appear dry.  Eyes: Conjunctivae and EOM are normal. Pupils are equal, round, and reactive to light.  Neck: Normal range of motion. Neck supple. No JVD present. No tracheal deviation present. No thyromegaly present.  Cardiovascular: Normal rate and regular rhythm.   Pulmonary/Chest:  Decreased breath sounds bilateral lower lung fields no wheezing noted  Abdominal: Soft. Bowel sounds are normal.  Musculoskeletal: Normal range of motion. He exhibits edema. He exhibits no tenderness.  Neurological: He is alert. He displays normal reflexes. No cranial nerve deficit. Coordination normal.  Patient is alert but is not oriented to place or time. He appears weak bilateral lower extremities left greater than right. Upper  extremities reveal some drift of the right upper extremity.  Skin: Skin is warm and dry.  Nursing note and vitals reviewed.   ED Course  Procedures (including critical care time) Labs Review Labs Reviewed  COMPREHENSIVE METABOLIC PANEL - Abnormal; Notable for the following:    Sodium 132 (*)    Potassium 2.8 (*)    Chloride 87 (*)    Glucose,  Bld 128 (*)    BUN 37 (*)    Creatinine, Ser 2.18 (*)    Calcium 8.2 (*)    Total Protein 6.3 (*)    Albumin 2.7 (*)    AST 52 (*)    Total Bilirubin 1.7 (*)    GFR calc non Af Amer 27 (*)    GFR calc Af Amer 32 (*)    All other components within normal limits  CBC WITH DIFFERENTIAL/PLATELET - Abnormal; Notable for the following:    WBC 13.0 (*)    RBC 3.69 (*)    Hemoglobin 10.9 (*)    HCT 31.9 (*)    Neutro Abs 9.3 (*)    Monocytes Relative 14 (*)    Monocytes Absolute 1.8 (*)    All other components within normal limits  PROTIME-INR - Abnormal; Notable for the following:    Prothrombin Time 15.5 (*)    All other components within normal limits  CBG MONITORING, ED - Abnormal; Notable for the following:    Glucose-Capillary 132 (*)    All other components within normal limits  I-STAT CG4 LACTIC ACID, ED - Abnormal; Notable for the following:    Lactic Acid, Venous 2.02 (*)    All other components within normal limits  I-STAT ARTERIAL BLOOD GAS, ED - Abnormal; Notable for the following:    pH, Arterial 7.549 (*)    pO2, Arterial 62.0 (*)    Bicarbonate 34.9 (*)    Acid-Base Excess 12.0 (*)    All other components within normal limits  CULTURE, BLOOD (ROUTINE X 2)  CULTURE, BLOOD (ROUTINE X 2)  URINALYSIS, ROUTINE W REFLEX MICROSCOPIC (NOT AT [facility])  URINE RAPID DRUG SCREEN, HOSP PERFORMED  ETHANOL  CBG MONITORING, ED  I-STAT TROPOININ, ED    Imaging Review Ct Head Wo Contrast  11/03/2014   CLINICAL DATA:  Acute confusion.  Possible heat exposure.  EXAM: CT HEAD WITHOUT CONTRAST  TECHNIQUE: Contiguous axial images were  obtained from the base of the skull through the vertex without intravenous contrast.  COMPARISON:  10/09/2014  FINDINGS: The brain shows mild generalized atrophy. There is mild small vessel change of the white matter. No sign of acute infarction, mass lesion, hemorrhage, hydrocephalus or extra-axial collection. No calvarial abnormality. Sinuses are clear. There is atherosclerotic calcification of the major vessels at the base of the brain.  IMPRESSION: No acute or significant finding. Mild atrophy and small-vessel change.   Electronically Signed   By: Nelson Chimes M.D.   On: 11/03/2014 20:21   Dg Chest Port 1 View  11/03/2014   CLINICAL DATA:  Confusion today. Weakness. History of diabetes and COPD. Initial encounter.  EXAM: PORTABLE CHEST - 1 VIEW  COMPARISON:  10/24/2014 and 10/14/2014 radiographs. PET-CT 05/20/2013.  FINDINGS: 1940 hours. The left subclavian AICD leads appear unchanged within the right ventricle. There is stable cardiomegaly status post median sternotomy and CABG. There is chronic calcified pleural thickening bilaterally, worse on the left. No superimposed airspace disease, edema or significant pleural effusion identified. The bones appear unchanged.  IMPRESSION: Stable postoperative chest with calcified pleural plaque formation bilaterally and cardiomegaly. No acute cardiopulmonary process.   Electronically Signed   By: Richardean Sale M.D.   On: 11/03/2014 19:57     EKG Interpretation   Date/Time:  Friday November 03 2014 19:21:23 EDT Ventricular Rate:  68 PR Interval:  237 QRS Duration: 174 QT Interval:  495 QTC Calculation: 526 R Axis:   115 Text Interpretation:  Sinus  rhythm Prolonged PR interval Nonspecific  intraventricular conduction delay Repol abnrm suggests ischemia, diffuse  leads Confirmed by Detrich Rakestraw MD, Andee Poles [phone]) on 11/03/2014 7:29:05 PM      MDM   Final diagnoses:  Weakness  Febrile illness  Hypokalemia  Volume depletion  Acute-on-chronic kidney injury     79 year old male presents today with fever and confusion. He is treated here per sepsis protocol with IV normal saline, Zosyn, and vancomycin. He has remained hemodynamically stable. Chest x-Willona Phariss is clear, urine clear, head CT with no focal abnormalities. No family has arrived for verification of history. Plan admission for continued IV antibody, rehydration, and potassium repletion. Discussed with Dr. Laren Everts and will place temporary admission orders for telemetry bed.  Pattricia Boss, MD 11/03/14 2135

## 2014-11-03 NOTE — Progress Notes (Signed)
ANTIBIOTIC CONSULT NOTE - INITIAL  Pharmacy Consult for Zosyn  Indication: rule out sepsis  Allergies  Allergen Reactions  . Niacin Itching    Niaspan     Patient Measurements: Height: 5\' 7"  (170.2 cm) Weight: 144 lb 14.4 oz (65.726 kg) IBW/kg (Calculated) : 66.1  Vital Signs: Temp: 98.2 F (36.8 C) (06/10 2303) Temp Source: Oral (06/10 2303) BP: 116/53 mmHg (06/10 2303) Pulse Rate: 65 (06/10 2303)  Labs:  Recent Labs  11/01/14 1110 11/03/14 1930  WBC 7.8 13.0*  HGB 10.2* 10.9*  PLT 188.0 174  CREATININE 1.94* 2.18*   Estimated Creatinine Clearance: 26.4 mL/min (by C-G formula based on Cr of 2.18).   Medical History: Past Medical History  Diagnosis Date  . VENTRICULAR TACHYCARDIA   . PERIPHERAL VASCULAR DISEASE   . HYPERTENSION     Hypertensive retinopathy-grade II OU  . HYPERCHOLESTEROLEMIA   . CORONARY ARTERY DISEASE     Hx of CABG  . Chronic systolic heart failure     Ischemic cardiomyopathy (01/2014 EF 40-45% with hypokinesis + small area of akinesis  . SEBORRHEIC DERMATITIS   . Chronic renal insufficiency, stage III (moderate)     CrCl 40s-50s  . DIVERTICULOSIS OF COLON   . COPD, severe   . COLONIC POLYPS   . ANXIETY   . ICD (implantable cardiac defibrillator) in place   . Type II diabetes mellitus     +background diabetic retinopathy OU  . Chronic lower back pain   . DJD (degenerative joint disease)     "hands" (05/24/2012)  . Osteoarthritis of finger   . History of atrial flutter   . Thrombus 12/2013    on ICD lead--started anticoag and his cardioversion for atrial flutter was postponed.  . Colon stricture 2015    with features worrisome for mass, also with recent rising CEA level (possible colon cancer)-GI MD= Dr. Jackelyn Hoehn has been declining colonoscopy since that time.  Dr. Liliane Channel impression on re-eval in 2015 was that the area represented chronic post-diverticulitis changes, not malignancy.  . Macular degeneration, age related,  nonexudative     OU  . Chronic blood loss anemia     Transfused 2 U pRBCs April, May, and June (10/26/14) of 2016.  GI w/u has revealed only 2 tiny non-bleeding AVMs in mid small bowel.   Assessment: 79 y/o M here after found on back deck with confusion, low grade fever possibly from sun exposure per MD note, WBC mildly elevated, noted renal dysfunction, other labs as above.   Plan:  Zosyn 3.375G IV q8h to be infused over 4 hours Trend WBC, temp, renal function   Narda Bonds 11/03/2014,11:52 PM

## 2014-11-04 DIAGNOSIS — I255 Ischemic cardiomyopathy: Secondary | ICD-10-CM

## 2014-11-04 DIAGNOSIS — N179 Acute kidney failure, unspecified: Secondary | ICD-10-CM

## 2014-11-04 DIAGNOSIS — N183 Chronic kidney disease, stage 3 (moderate): Secondary | ICD-10-CM

## 2014-11-04 DIAGNOSIS — E860 Dehydration: Secondary | ICD-10-CM | POA: Diagnosis present

## 2014-11-04 DIAGNOSIS — I482 Chronic atrial fibrillation: Secondary | ICD-10-CM

## 2014-11-04 DIAGNOSIS — N189 Chronic kidney disease, unspecified: Secondary | ICD-10-CM

## 2014-11-04 LAB — BASIC METABOLIC PANEL
Anion gap: 10 (ref 5–15)
BUN: 30 mg/dL — ABNORMAL HIGH (ref 6–20)
CHLORIDE: 98 mmol/L — AB (ref 101–111)
CO2: 26 mmol/L (ref 22–32)
Calcium: 7.8 mg/dL — ABNORMAL LOW (ref 8.9–10.3)
Creatinine, Ser: 1.64 mg/dL — ABNORMAL HIGH (ref 0.61–1.24)
GFR, EST AFRICAN AMERICAN: 45 mL/min — AB (ref >60)
GFR, EST NON AFRICAN AMERICAN: 39 mL/min — AB (ref >60)
Glucose, Bld: 126 mg/dL — ABNORMAL HIGH (ref 65–99)
Potassium: 3.5 mmol/L (ref 3.5–5.1)
Sodium: 134 mmol/L — ABNORMAL LOW (ref 135–145)

## 2014-11-04 LAB — CBC
HEMATOCRIT: 28.6 % — AB (ref 39.0–52.0)
HEMOGLOBIN: 9.3 g/dL — AB (ref 13.0–17.0)
MCH: 28.5 pg (ref 26.0–34.0)
MCHC: 32.5 g/dL (ref 30.0–36.0)
MCV: 87.7 fL (ref 78.0–100.0)
Platelets: 158 10*3/uL (ref 150–400)
RBC: 3.26 MIL/uL — ABNORMAL LOW (ref 4.22–5.81)
RDW: 15.6 % — ABNORMAL HIGH (ref 11.5–15.5)
WBC: 11.1 10*3/uL — ABNORMAL HIGH (ref 4.0–10.5)

## 2014-11-04 LAB — TROPONIN I
TROPONIN I: 0.07 ng/mL — AB (ref <0.031)
Troponin I: 0.08 ng/mL — ABNORMAL HIGH (ref <0.031)

## 2014-11-04 LAB — TSH: TSH: 5.302 u[IU]/mL — ABNORMAL HIGH (ref 0.350–4.500)

## 2014-11-04 LAB — GLUCOSE, CAPILLARY: Glucose-Capillary: 181 mg/dL — ABNORMAL HIGH (ref 65–99)

## 2014-11-04 MED ORDER — POTASSIUM CHLORIDE 10 MEQ/100ML IV SOLN
10.0000 meq | Freq: Once | INTRAVENOUS | Status: AC
  Administered 2014-11-04: 10 meq via INTRAVENOUS
  Filled 2014-11-04: qty 100

## 2014-11-04 NOTE — Discharge Summary (Addendum)
Physician Discharge Summary  [name] TIR:[id number] DOB: [birth year] DOA: 11/03/2014  PCP: Tammi Sou, MD  Admit date: 11/03/2014 Discharge date: 11/04/2014  Time spent: 35 minutes  Recommendations for Outpatient Follow-up:  1. Please follow up on volume status, I suspect dehydration may have contributed to symptoms for which he was given IV fluids during this hospitalization having subsequent improvement.    Discharge Diagnoses:  Active Problems:   Dehydration   CKD (chronic kidney disease) stage 3, GFR 30-59 ml/min   Fever   Cardiomyopathy, ischemic   Chronic systolic congestive heart failure   Acute on chronic renal failure   Chronic anticoagulation   Chronic atrial fibrillation   Altered mental status   Discharge Condition: Stable/improved  Diet recommendation: Heart healthy  Filed Weights   11/03/14 2303  Weight: 65.726 kg (144 lb 14.4 oz)    History of present illness:  [name] is a 79 y.o. male, with past medical history significant for coronary artery disease status post CABG, sick sinus syndrome status post pacemaker and congestive heart failure who was brought today by EMS after he was found by his neighbor on the porch with altered mental status . Patient denies preceding chest pains palpitations nausea or vomiting he reports laying down in the sun and went to sleep and thinks he might be some burned. He received IV fluids in the emergency room and already feels and looks better and he is been worked up for a fever noted on admission  Hospital Course:  Patient is a pleasant 79 year old male with multiple comorbidities including coronary artery disease, ischemic cardiopathy myopathy, sick sinus syndrome status post pacemaker implant, chronic systolic congestive heart failure, admitted to the medicine service and 11/03/2014. Patient was found by a neighbor's in on his porch having mental status changes. He recalled laying out in the porch under  the sun fall asleep for well. He showed immediate improvement after receiving IV fluids in the emergency room. He was capped overnight where he continued to receive IV fluids. By the following morning he reported feeling well, ambulated down the hallway and back, was nonfocal, nontoxic, tolerating by mouth intake. Workup which included chest x-ray urinalysis did not show evidence of infection. He remained afebrile. Labs showed a downward trend in creatinine with IV fluid resuscitation. Suspect dehydration may have contributed to symptoms as he is currently on diarrhetic therapy for cardiomyopathy. Patient was discharged to his home in stable condition on 11/04/2014.   Discharge Exam: Filed Vitals:   11/04/14 0548  BP: 99/46  Pulse: 64  Temp: 98.2 F (36.8 C)  Resp: 18    General: Patient is awake and alert oriented 3, ambulated down to the nurses station and back, tolerating by mouth intake states feeling much better Cardiovascular: Regular rate and rhythm normal S1-S2 no extremity edema Respiratory: Normal respiratory effort, lungs are clear to auscultation bilaterally Abdomen: Soft nontender nondistended positive bowel sounds  Discharge Instructions   Discharge Instructions    Call MD for:  difficulty breathing, headache or visual disturbances    Complete by:  As directed      Call MD for:  extreme fatigue    Complete by:  As directed      Call MD for:  hives    Complete by:  As directed      Call MD for:  persistant dizziness or light-headedness    Complete by:  As directed      Call MD for:  persistant nausea and vomiting  Complete by:  As directed      Call MD for:  redness, tenderness, or signs of infection (pain, swelling, redness, odor or green/yellow discharge around incision site)    Complete by:  As directed      Call MD for:  severe uncontrolled pain    Complete by:  As directed      Call MD for:  temperature >100.4    Complete by:  As directed      Call MD for:     Complete by:  As directed      Diet - low sodium heart healthy    Complete by:  As directed      Increase activity slowly    Complete by:  As directed           Current Discharge Medication List    CONTINUE these medications which have NOT CHANGED   Details  amiodarone (PACERONE) 200 MG tablet Take 1 tablet (200 mg total) by mouth daily. Qty: 30 tablet, Refills: 0    Fluticasone-Salmeterol (ADVAIR) 500-50 MCG/DOSE AEPB Inhale 1 puff into the lungs 2 (two) times daily. Qty: 60 each, Refills: 11    folic acid (FOLVITE) 1 MG tablet Take 1 tablet (1 mg total) by mouth daily. Qty: 30 tablet, Refills: 0    guaiFENesin (MUCINEX) 600 MG 12 hr tablet Take 1 tablet (600 mg total) by mouth 2 (two) times daily.    hydrALAZINE (APRESOLINE) 25 MG tablet Take 1 tablet (25 mg total) by mouth 3 (three) times daily. Qty: 90 tablet, Refills: 1    Insulin Glargine (LANTUS SOLOSTAR) 100 UNIT/ML Solostar Pen 10 units SQ qhs Qty: 15 mL, Refills: 11    insulin lispro (HUMALOG KWIKPEN) 100 UNIT/ML KiwkPen Take 5 units in the morning, 5 units at noon, --if blood sugar over 100 and you eat > 50% of meals Qty: 5 pen, Refills: 3    isosorbide mononitrate (IMDUR) 30 MG 24 hr tablet Take 1 tablet (30 mg total) by mouth daily. Qty: 30 tablet, Refills: 0    levothyroxine (SYNTHROID, LEVOTHROID) 88 MCG tablet Take 1 tablet (88 mcg total) by mouth daily. Qty: 30 tablet, Refills: 2    pantoprazole (PROTONIX) 40 MG tablet Take 1 tablet (40 mg total) by mouth daily. Qty: 30 tablet, Refills: 1    potassium chloride SA (KLOR-CON M20) 20 MEQ tablet Take 1 tablet (20 mEq total) by mouth 2 (two) times daily. Qty: 60 tablet, Refills: 1    pravastatin (PRAVACHOL) 80 MG tablet Take 1 tablet (80 mg total) by mouth at bedtime. Qty: 30 tablet, Refills: 0    senna-docusate (SENOKOT-S) 8.6-50 MG per tablet Take 1 tablet by mouth at bedtime as needed for mild constipation. Qty: 100 tablet, Refills: 1    tiotropium  (SPIRIVA HANDIHALER) 18 MCG inhalation capsule PLACE ONE CAPSULE INTO INHALER AND  INHALE  DAILY Qty: 30 capsule, Refills: 0    torsemide (DEMADEX) 20 MG tablet Take 3 tablets (60 mg total) by mouth daily. Qty: 90 tablet, Refills: 2   Associated Diagnoses: Chronic atrial fibrillation      STOP taking these medications     metolazone (ZAROXOLYN) 2.5 MG tablet      thiamine 100 MG tablet        Allergies  Allergen Reactions  . Niacin Itching    Niaspan    Follow-up Information    Follow up with MCGOWEN,PHILIP H, MD In 1 week.   Specialty:  Family Medicine   Contact  information:   [address]  [address] [phone]        The results of significant diagnostics from this hospitalization (including imaging, microbiology, ancillary and laboratory) are listed below for reference.    Significant Diagnostic Studies: Dg Chest 2 View  10/24/2014   CLINICAL DATA:  Follow-up pneumonia  EXAM: CHEST  2 VIEW  COMPARISON:  10/14/2014 and 10/09/2014  FINDINGS: Cardiomediastinal silhouette is stable. Status post CABG. Dual lead cardiac pacemaker is unchanged in position. Hyperinflation again noted. Again noted blunting of the right costophrenic angle. Stable bilateral pleural plaques and chronic interstitial prominence. No definite superimposed infiltrate or pulmonary edema. Stable mild degenerative changes lower thoracic spine.  IMPRESSION: Hyperinflation again noted. Again noted blunting of the right costophrenic angle. Stable bilateral pleural plaques and chronic interstitial prominence. No definite superimposed infiltrate or pulmonary edema.   Electronically Signed   By: Lahoma Crocker M.D.   On: 10/24/2014 17:01   Dg Chest 2 View  10/14/2014   CLINICAL DATA:  Hospital acquired pneumonia.  Ex-smoker.  EXAM: CHEST  2 VIEW  COMPARISON:  10/09/2014.  FINDINGS: Stable enlarged cardiac silhouette, post CABG changes and left subclavian and AICD leads. Stable prominence of the  interstitial markings and calcified pleural plaques. Small amount of bilateral pleural thickening or fluid. Interval mild patchy opacity in the right mid lung zone. The hemidiaphragms remain flattened.  IMPRESSION: 1. Interval mild patchy opacity suspicious for pneumonia in the right mid lung zone. 2. Stable changes of COPD with interstitial fibrosis. 3. 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   Electronically Signed   By: Claudie Revering M.D.   On: 10/14/2014 10:29   Dg Chest 2 View  10/09/2014   CLINICAL DATA:  Weakness, shortness of breath.  History of COPD.  EXAM: CHEST  2 VIEW  COMPARISON:  09/11/2014  FINDINGS: Lungs are hyperinflated with emphysema. There are bilateral calcified pleural plaques. Dual lead left-sided pacemaker remains in place. Patient is post median sternotomy. Stable cardiomegaly. No pulmonary edema. Improved right upper lobe opacity compared to prior. Increased opacity at the right costophrenic angle may reflect new focal consolidation, atelectasis, or less likely pleural effusion. No left pleural effusion. No pneumothorax. No acute osseous abnormalities are seen.  IMPRESSION: Increasing opacity at the right costophrenic angle that is new/ progressed from prior exams and may reflect atelectasis, pneumonia or less likely pleural effusion.  Underlying chronic lung disease, bilateral pleural plaques and emphysema. Stable cardiomegaly.   Electronically Signed   By: Jeb Levering M.D.   On: 10/09/2014 23:23   Dg Thoracic Spine W/swimmers  10/09/2014   CLINICAL DATA:  Acute onset of chronic upper back pain. Initial encounter.  EXAM: THORACIC SPINE - 2 VIEW + SWIMMERS  COMPARISON:  Chest radiograph performed 09/11/2014  FINDINGS: There is no evidence of fracture or subluxation. Vertebral bodies demonstrate normal height and alignment. Mild degenerative change is noted along the cervical spine.  The visualized portions of both lungs are clear. The mediastinum is  borderline normal in size. The patient is status post median sternotomy, with evidence of prior CABG. AICD leads are noted.  IMPRESSION: No evidence of fracture or subluxation along the cervical spine.   Electronically Signed   By: Garald Balding M.D.   On: 10/09/2014 23:22   Dg Lumbar Spine 2-3 Views  10/09/2014   CLINICAL DATA:  Chronic lower back pain.  Initial encounter.  EXAM: LUMBAR SPINE - 2-3 VIEW  COMPARISON:  CT of  the abdomen and pelvis performed 12/07/2012  FINDINGS: There is no evidence of acute fracture or subluxation. Vertebral bodies demonstrate normal height. There is grade 1 anterolisthesis of L3 on L4. Mild disc space narrowing is noted at L2-L3 and L5-S1. Associated endplate sclerotic change is noted.  The visualized bowel gas pattern is unremarkable in appearance; air and stool are noted within the colon. The sacroiliac joints are within normal limits. Scattered vascular calcifications are seen.  IMPRESSION: 1. No evidence of acute fracture or subluxation along the lumbar spine. 2. Mild degenerative change noted along the lumbar spine. 3. Scattered vascular calcifications seen.   Electronically Signed   By: Garald Balding M.D.   On: 10/09/2014 23:24   Ct Head Wo Contrast  11/03/2014   CLINICAL DATA:  Acute confusion.  Possible heat exposure.  EXAM: CT HEAD WITHOUT CONTRAST  TECHNIQUE: Contiguous axial images were obtained from the base of the skull through the vertex without intravenous contrast.  COMPARISON:  10/09/2014  FINDINGS: The brain shows mild generalized atrophy. There is mild small vessel change of the white matter. No sign of acute infarction, mass lesion, hemorrhage, hydrocephalus or extra-axial collection. No calvarial abnormality. Sinuses are clear. There is atherosclerotic calcification of the major vessels at the base of the brain.  IMPRESSION: No acute or significant finding. Mild atrophy and small-vessel change.   Electronically Signed   By: Nelson Chimes M.D.   On:  11/03/2014 20:21   Ct Head Wo Contrast  10/09/2014   CLINICAL DATA:  Acute onset of generalized weakness. Back pain. Frequent falls. Worsening confusion. Initial encounter.  EXAM: CT HEAD WITHOUT CONTRAST  TECHNIQUE: Contiguous axial images were obtained from the base of the skull through the vertex without intravenous contrast.  COMPARISON:  CT of the neck performed 03/11/2007  FINDINGS: There is no evidence of acute infarction, mass lesion, or intra- or extra-axial hemorrhage on CT.  Prominence of the sulci suggests mild cortical volume loss. Mild cerebellar atrophy is noted. Mild periventricular white matter change likely reflects small vessel ischemic microangiopathy.  The brainstem and fourth ventricle are within normal limits. The third and lateral ventricles, and basal ganglia are unremarkable in appearance. The cerebral hemispheres are symmetric in appearance, with normal gray-white differentiation. No mass effect or midline shift is seen.  There is no evidence of fracture; visualized osseous structures are unremarkable in appearance. The orbits are within normal limits. The paranasal sinuses and mastoid air cells are well-aerated. No significant soft tissue abnormalities are seen.  IMPRESSION: 1. No acute intracranial pathology seen on CT. 2. Mild cortical volume loss and scattered small vessel ischemic microangiopathy.   Electronically Signed   By: Garald Balding M.D.   On: 10/09/2014 23:46   Dg Chest Port 1 View  11/03/2014   CLINICAL DATA:  Confusion today. Weakness. History of diabetes and COPD. Initial encounter.  EXAM: PORTABLE CHEST - 1 VIEW  COMPARISON:  10/24/2014 and 10/14/2014 radiographs. PET-CT 05/20/2013.  FINDINGS: 1940 hours. The left subclavian AICD leads appear unchanged within the right ventricle. There is stable cardiomegaly status post median sternotomy and CABG. There is chronic calcified pleural thickening bilaterally, worse on the left. No superimposed airspace disease, edema  or significant pleural effusion identified. The bones appear unchanged.  IMPRESSION: Stable postoperative chest with calcified pleural plaque formation bilaterally and cardiomegaly. No acute cardiopulmonary process.   Electronically Signed   By: Richardean Sale M.D.   On: 11/03/2014 19:57    Microbiology: No results found for this or  any previous visit (from the past 240 hour(s)).   Labs: Basic Metabolic Panel:  Recent Labs Lab 10/30/14 1600 11/01/14 1110 11/03/14 1930 11/04/14 0539  NA 131* 134* 132* 134*  K 5.1 3.3* 2.8* 3.5  CL 94* 90* 87* 98*  CO2 26 36* 32 26  GLUCOSE 168* 145* 128* 126*  BUN 36* 37* 37* 30*  CREATININE 2.15* 1.94* 2.18* 1.64*  CALCIUM 8.5* 8.6 8.2* 7.8*   Liver Function Tests:  Recent Labs Lab 11/03/14 1930  AST 52*  ALT 20  ALKPHOS 95  BILITOT 1.7*  PROT 6.3*  ALBUMIN 2.7*   No results for input(s): LIPASE, AMYLASE in the last 168 hours. No results for input(s): AMMONIA in the last 168 hours. CBC:  Recent Labs Lab 11/01/14 1110 11/03/14 1930 11/04/14 0539  WBC 7.8 13.0* 11.1*  NEUTROABS  --  9.3*  --   HGB 10.2* 10.9* 9.3*  HCT 31.2* 31.9* 28.6*  MCV 88.8 86.4 87.7  PLT 188.0 174 158   Cardiac Enzymes:  Recent Labs Lab 11/04/14 0015 11/04/14 0539  TROPONINI 0.07* 0.08*   BNP: BNP (last 3 results)  Recent Labs  08/30/14 1639 09/09/14 1150 10/09/14 2237  BNP 800.7* 550.7* 715.8*    ProBNP (last 3 results)  Recent Labs  01/24/14 1909 02/09/14 0945  PROBNP 6721.0* 2555.0*    CBG:  Recent Labs Lab 11/03/14 1927 11/04/14 0127  GLUCAP 132* 181*       Signed:  Orey Moure  [facility] 11/04/2014, 9:47 AM

## 2014-11-04 NOTE — Progress Notes (Signed)
Pt discharged home with wife.  Alert and oriented x4.  No c/o pain.  Pt steady walking.  VS normal.  Education given on meds, activity, diet, and follow-up care and appointments.  Pt verbalized understanding.  IV D/Cd.  Tele D/Cd.

## 2014-11-04 NOTE — H&P (Signed)
Pt. arrived on floor around 2330. I assessed Pt. Around 2350. Pt was talkative and A&Ox4. When I came back at 1 am to do his admission, he was asleep and asked if it could be done tomorrow. I continued to try and hold his attention, he states that its been a long day and he just wants to rest.[name] 1250

## 2014-11-05 LAB — GLUCOSE, CAPILLARY: Glucose-Capillary: 113 mg/dL — ABNORMAL HIGH (ref 65–99)

## 2014-11-06 LAB — HEMOGLOBIN A1C
HEMOGLOBIN A1C: 5.1 % (ref 4.8–5.6)
MEAN PLASMA GLUCOSE: 100 mg/dL

## 2014-11-06 NOTE — Progress Notes (Signed)
Utilization review completed- post discharge 

## 2014-11-08 ENCOUNTER — Encounter: Payer: Self-pay | Admitting: Family Medicine

## 2014-11-08 ENCOUNTER — Ambulatory Visit (INDEPENDENT_AMBULATORY_CARE_PROVIDER_SITE_OTHER): Payer: Medicare Other | Admitting: Family Medicine

## 2014-11-08 ENCOUNTER — Ambulatory Visit: Payer: Medicare Other | Admitting: Family Medicine

## 2014-11-08 VITALS — BP 92/54 | HR 65 | Temp 97.5°F | Resp 16 | Wt 139.0 lb

## 2014-11-08 DIAGNOSIS — D500 Iron deficiency anemia secondary to blood loss (chronic): Secondary | ICD-10-CM

## 2014-11-08 DIAGNOSIS — I482 Chronic atrial fibrillation, unspecified: Secondary | ICD-10-CM

## 2014-11-08 DIAGNOSIS — I5022 Chronic systolic (congestive) heart failure: Secondary | ICD-10-CM

## 2014-11-08 DIAGNOSIS — I255 Ischemic cardiomyopathy: Secondary | ICD-10-CM | POA: Diagnosis not present

## 2014-11-08 LAB — BASIC METABOLIC PANEL
BUN: 40 mg/dL — ABNORMAL HIGH (ref 6–23)
CALCIUM: 8.4 mg/dL (ref 8.4–10.5)
CHLORIDE: 84 meq/L — AB (ref 96–112)
CO2: 31 meq/L (ref 19–32)
CREATININE: 2.1 mg/dL — AB (ref 0.40–1.50)
GFR: 32.69 mL/min — ABNORMAL LOW (ref >60.00)
Glucose, Bld: 187 mg/dL — ABNORMAL HIGH (ref 70–99)
Potassium: 3.2 mEq/L — ABNORMAL LOW (ref 3.5–5.1)
SODIUM: 128 meq/L — AB (ref 135–145)

## 2014-11-08 LAB — CBC
HCT: 29.3 % — ABNORMAL LOW (ref 39.0–52.0)
Hemoglobin: 9.5 g/dL — ABNORMAL LOW (ref 13.0–17.0)
MCHC: 32.3 g/dL (ref 30.0–36.0)
MCV: 88.3 fl (ref 78.0–100.0)
Platelets: 175 10*3/uL (ref 150.0–400.0)
RBC: 3.32 Mil/uL — ABNORMAL LOW (ref 4.22–5.81)
RDW: 16.7 % — AB (ref 11.5–15.5)
WBC: 10.6 10*3/uL — ABNORMAL HIGH (ref 4.0–10.5)

## 2014-11-08 LAB — MAGNESIUM: MAGNESIUM: 1.3 mg/dL — AB (ref 1.5–2.5)

## 2014-11-08 MED ORDER — TORSEMIDE 20 MG PO TABS: ORAL_TABLET | ORAL | Status: DC

## 2014-11-08 MED ORDER — METOLAZONE 2.5 MG PO TABS: ORAL_TABLET | ORAL | Status: DC

## 2014-11-08 NOTE — Progress Notes (Signed)
OFFICE NOTE  11/08/2014  CC:  Chief Complaint  Patient presents with  . Hospitalization Follow-up     HPI: Patient is a 79 y.o. Caucasian male who is here for hospital f/u: admitted 6/10-6/11, 2016 for MS changes presumed to be secondary to dehydration.  He was also hypokalemic. He feels well today.  No SOB or CP or palpitations. Pt slightly confused about how much demadex he is supposed to be taking.  He has been taking 20mg  bid but hospital d/c summary has instructions to take 20mg  tid.    Pertinent PMH:  Past medical, surgical, social, and family history reviewed and no changes are noted since last office visit.  MEDS: Metolazone 2.5mg  q Mon and fri Taking demadex 20mg  bid Outpatient Prescriptions Prior to Visit  Medication Sig Dispense Refill  . amiodarone (PACERONE) 200 MG tablet Take 1 tablet (200 mg total) by mouth daily. 30 tablet 0  . Fluticasone-Salmeterol (ADVAIR) 500-50 MCG/DOSE AEPB Inhale 1 puff into the lungs 2 (two) times daily. 60 each 11  . folic acid (FOLVITE) 1 MG tablet Take 1 tablet (1 mg total) by mouth daily. 30 tablet 0  . guaiFENesin (MUCINEX) 600 MG 12 hr tablet Take 1 tablet (600 mg total) by mouth 2 (two) times daily.    . hydrALAZINE (APRESOLINE) 25 MG tablet Take 1 tablet (25 mg total) by mouth 3 (three) times daily. 90 tablet 1  . Insulin Glargine (LANTUS SOLOSTAR) 100 UNIT/ML Solostar Pen 10 units SQ qhs (Patient taking differently: Inject 10 Units into the skin at bedtime. ) 15 mL 11  . insulin lispro (HUMALOG KWIKPEN) 100 UNIT/ML KiwkPen Take 5 units in the morning, 5 units at noon, --if blood sugar over 100 and you eat > 50% of meals (Patient taking differently: Inject 7-9 Units into the skin 3 (three) times daily. Take 7 units in the morning, 8 units at noon, and 9 units at dinner) 5 pen 3  . isosorbide mononitrate (IMDUR) 30 MG 24 hr tablet Take 1 tablet (30 mg total) by mouth daily. 30 tablet 0  . levothyroxine (SYNTHROID, LEVOTHROID) 88 MCG tablet  Take 1 tablet (88 mcg total) by mouth daily. 30 tablet 2  . pantoprazole (PROTONIX) 40 MG tablet Take 1 tablet (40 mg total) by mouth daily. 30 tablet 1  . potassium chloride SA (KLOR-CON M20) 20 MEQ tablet Take 1 tablet (20 mEq total) by mouth 2 (two) times daily. 60 tablet 1  . pravastatin (PRAVACHOL) 80 MG tablet Take 1 tablet (80 mg total) by mouth at bedtime. 30 tablet 0  . senna-docusate (SENOKOT-S) 8.6-50 MG per tablet Take 1 tablet by mouth at bedtime as needed for mild constipation. 100 tablet 1  . tiotropium (SPIRIVA HANDIHALER) 18 MCG inhalation capsule PLACE ONE CAPSULE INTO INHALER AND  INHALE  DAILY (Patient taking differently: Place 18 mcg into inhaler and inhale at bedtime. ) 30 capsule 0  . torsemide (DEMADEX) 20 MG tablet Take 3 tablets (60 mg total) by mouth daily. 90 tablet 2   No facility-administered medications prior to visit.    PE: Blood pressure 92/54, pulse 65, temperature 97.5 F (36.4 C), temperature source Oral, resp. rate 16, weight 139 lb (63.05 kg), SpO2 96 %. Gen: Alert, well appearing.  Patient is oriented to person, place, time, and situation. AFFECT: pleasant, lucid thought and speech. No scleral icterus, color is good. CV: RRR, no m/r/g.   LUNGS: CTA bilat, nonlabored resps, good aeration in all lung fields. EXT: 1-2+ pitting edema  in lower legs  LABS: Lab Results  Component Value Date   WBC 11.1* 11/04/2014   HGB 9.3* 11/04/2014   HCT 28.6* 11/04/2014   MCV 87.7 11/04/2014   PLT 158 11/04/2014     Chemistry      Component Value Date/Time   NA 134* 11/04/2014 0539   K 3.5 11/04/2014 0539   CL 98* 11/04/2014 0539   CO2 26 11/04/2014 0539   BUN 30* 11/04/2014 0539   CREATININE 1.64* 11/04/2014 0539      Component Value Date/Time   CALCIUM 7.8* 11/04/2014 0539   ALKPHOS 95 11/03/2014 1930   AST 52* 11/03/2014 1930   ALT 20 11/03/2014 1930   BILITOT 1.7* 11/03/2014 1930     IMPRESSION AND PLAN:  1) Ischemic cardiomyopathy/chronic  CHF: volume status looks good today. Take demadex 20mg  bid, take metolazone 2.5mg  on Mondays and Fridays as per his cardiologist's most recent office note. Check BMET and magnesium today.  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  Recheck this today.  If this continues to require transfusions (even while OFF anticoagulants) then he will need referral for enteroscopy.  Spent 25 min with pt today, with >50% of this time spent in counseling and care coordination regarding the above problems.   An After Visit Summary was printed and given to the patient.  FOLLOW UP: 2 wks

## 2014-11-08 NOTE — Progress Notes (Signed)
Pre visit review using our clinic review tool, if applicable. No additional management support is needed unless otherwise documented below in the visit note. 

## 2014-11-10 LAB — CULTURE, BLOOD (ROUTINE X 2)
CULTURE: NO GROWTH
Culture: NO GROWTH

## 2014-11-13 ENCOUNTER — Encounter: Payer: Self-pay | Admitting: Physician Assistant

## 2014-11-13 ENCOUNTER — Ambulatory Visit (INDEPENDENT_AMBULATORY_CARE_PROVIDER_SITE_OTHER): Payer: Medicare Other | Admitting: Physician Assistant

## 2014-11-13 VITALS — BP 100/40 | HR 70 | Ht 67.0 in | Wt 137.8 lb

## 2014-11-13 DIAGNOSIS — I255 Ischemic cardiomyopathy: Secondary | ICD-10-CM | POA: Diagnosis not present

## 2014-11-13 DIAGNOSIS — E860 Dehydration: Secondary | ICD-10-CM | POA: Diagnosis not present

## 2014-11-13 DIAGNOSIS — I4892 Unspecified atrial flutter: Secondary | ICD-10-CM | POA: Diagnosis not present

## 2014-11-13 DIAGNOSIS — I10 Essential (primary) hypertension: Secondary | ICD-10-CM

## 2014-11-13 DIAGNOSIS — I5022 Chronic systolic (congestive) heart failure: Secondary | ICD-10-CM

## 2014-11-13 LAB — BASIC METABOLIC PANEL
BUN: 37 mg/dL — AB (ref 6–23)
CHLORIDE: 82 meq/L — AB (ref 96–112)
CO2: 34 mEq/L — ABNORMAL HIGH (ref 19–32)
CREATININE: 2.05 mg/dL — AB (ref 0.40–1.50)
Calcium: 9.3 mg/dL (ref 8.4–10.5)
GFR: 33.61 mL/min — ABNORMAL LOW (ref >60.00)
GLUCOSE: 104 mg/dL — AB (ref 70–99)
Potassium: 3.1 mEq/L — ABNORMAL LOW (ref 3.5–5.1)
Sodium: 126 mEq/L — ABNORMAL LOW (ref 135–145)

## 2014-11-13 NOTE — Progress Notes (Signed)
Cardiology Office Note   Date:  11/13/2014   ID:  [name], [name] 1936/02/23, MRN [id number]  PCP:  Tammi Sou, MD  Cardiologist:  Hochrein/Bensimhon-CHF/Taylor-EPS  Chief Complaint: weak     History of Present Illness: [name] is a 79 y.o. male who presents for post hospital follow-up. He has a history of ischemic cardiomyopathy status post Medtronic ICD, EF 20-25% on echo in 11/2013, 40-45% on echo in 08/2014. CAD status post CABG, chronic systolic heart failure, V. tach, atrial flutter, PVD, GI mass, hypertension. He is also been anemic and endoscopy showed small AVMs in one larger AVM. He has been transfused. His Eliquis was stopped. He was just seen in the heart failure clinic on 10/30/14. His torsemide was increased to 60 mg daily and metolazone 2.5 mg Monday and Friday. Carvedilol was stopped because of low blood pressure. He was then admitted to the hospital on 01/03/15 with dehydration and given gentle IV fluids.  The patient had been feeling well but now is very weak and shaky and unstable. His weight is down to 137 pounds. Discharge weight was 144 pounds and in the heart failure clinic on 10/30/14 he was 147 pounds. He is also coughing up green sputum. He saw Dr. Anitra Lauth who did blood work and chest x-ray that currently showed no pneumonia. He's had no fever and his white count was not elevated. His potassium was 3.2 and his creatinine was up to 2.1. Does have home health checking on him regularly.    Past Medical History  Diagnosis Date  . VENTRICULAR TACHYCARDIA   . PERIPHERAL VASCULAR DISEASE   . HYPERTENSION     Hypertensive retinopathy-grade II OU  . HYPERCHOLESTEROLEMIA   . CORONARY ARTERY DISEASE     Hx of CABG  . Chronic systolic heart failure     Ischemic cardiomyopathy (01/2014 EF 40-45% with hypokinesis + small area of akinesis  . SEBORRHEIC DERMATITIS   . Chronic renal insufficiency, stage III (moderate)     CrCl 40s-50s  . DIVERTICULOSIS OF  COLON   . COPD, severe   . COLONIC POLYPS   . ANXIETY   . ICD (implantable cardiac defibrillator) in place   . Type II diabetes mellitus     +background diabetic retinopathy OU  . Chronic lower back pain   . DJD (degenerative joint disease)     "hands" (05/24/2012)  . Osteoarthritis of finger   . History of atrial flutter   . Thrombus 12/2013    on ICD lead--started anticoag and his cardioversion for atrial flutter was postponed.  . Colon stricture 2015    with features worrisome for mass, also with recent rising CEA level (possible colon cancer)-GI MD= Dr. Jackelyn Hoehn has been declining colonoscopy since that time.  Dr. Liliane Channel impression on re-eval in 2015 was that the area represented chronic post-diverticulitis changes, not malignancy.  . Macular degeneration, age related, nonexudative     OU  . Chronic blood loss anemia     Transfused 2 U pRBCs April, May, and June (10/26/14) of 2016.  GI w/u has revealed only 2 tiny non-bleeding AVMs in mid small bowel.    Past Surgical History  Procedure Laterality Date  . Coronary artery bypass graft  1990's    CABG X4  . Tonsillectomy  ?1942  . Cardiac defibrillator placement  12/2004    single chamber defibrillator- Medtronic Maxima 8/06 DrKlein [Other][  . Tee without cardioversion N/A 12/22/2013    Procedure: TRANSESOPHAGEAL ECHOCARDIOGRAM (TEE);  Surgeon:  Thayer Headings, MD;  Location: [facility];  Service: Cardiovascular;  Laterality: N/A;  . Cardioversion N/A 12/22/2013    Procedure: CARDIOVERSION;  Surgeon: Thayer Headings, MD;  Location: [facility] ENDOSCOPY;  Service: Cardiovascular;  Laterality: N/A;  . Cardiovascular stress test  01/12/13    myocard perf imaging: previous infarct with a moderately reduced EF as was known previously.  No new findings.   . Gastric emptying scan  02/02/14    Normal  . Transthoracic echocardiogram  01/25/14; 04/2014    EF 40-45%, diffuse hypokinesis, infero-basilar myocardial akinesis, PA pressure slightly  high, valves fine.04/2014-Echo today EF ~40% inferior AK moderate to severe MR  . Abdominal ultrasound  01/2014    GB sludge, o/w normal  . Ct virtual colonoscopy diagnostic  11/2012; repeat 09/2014    Asymmetric thickening of the rectosigmoid colon worrisome for colon cancer, but further eval by pt's GI MD led to conclusion that this was likely sequela of recurrent diverticulitis.  Pt has refused colonoscopy to evaluate this region.  . Cardiovascular stress test      Lexiscan: There is a medium sized fixed inferior wall defect of moderate severity involving the apical inferior and mid inferior and basal inferoseptal segments, consistent with old inferior MI. No significant reversible ischemia.  EF 34%, inferior wall hypokinesis--intermediate risk scan (ok to do planned procedure)  . Implantable cardioverter defibrillator revision N/A 05/25/2012    Procedure: IMPLANTABLE CARDIOVERTER DEFIBRILLATOR REVISION;  Surgeon: Evans Lance, MD;  Location: [facility] CATH LAB;  Service: Cardiovascular;  Laterality: N/A;  . Right heart catheterization N/A 08/31/2014    Procedure: RIGHT HEART CATH;  Surgeon: Larey Dresser, MD;  Location: [facility] CATH LAB;  Service: Cardiovascular;  Laterality: N/A;  . Esophagogastroduodenoscopy N/A 09/03/2014    Candida esophagitis + retained gastric contents.  No bleeding. Procedure: ESOPHAGOGASTRODUODENOSCOPY (EGD);  Surgeon: Carol Ada, MD;  Location: [facility] ENDOSCOPY;  Service: Endoscopy;  Laterality: N/A;  . Givens capsule study N/A 10/12/2014    Procedure: GIVENS CAPSULE STUDY;  Surgeon: Irene Shipper, MD;  Location: [facility];  Service: Endoscopy;  Laterality: N/A;     Current Outpatient Prescriptions  Medication Sig Dispense Refill  . amiodarone (PACERONE) 200 MG tablet Take 1 tablet (200 mg total) by mouth daily. 30 tablet 0  . Fluticasone-Salmeterol (ADVAIR) 500-50 MCG/DOSE AEPB Inhale 1 puff into the lungs 2 (two) times daily. 60 each 11  . folic acid (FOLVITE) 1 MG tablet Take  1 tablet (1 mg total) by mouth daily. 30 tablet 0  . guaiFENesin (MUCINEX) 600 MG 12 hr tablet Take 1 tablet (600 mg total) by mouth 2 (two) times daily.    . hydrALAZINE (APRESOLINE) 25 MG tablet Take 1 tablet (25 mg total) by mouth 3 (three) times daily. 90 tablet 1  . Insulin Glargine (LANTUS SOLOSTAR) 100 UNIT/ML Solostar Pen 10 units SQ qhs (Patient taking differently: Inject 10 Units into the skin at bedtime. ) 15 mL 11  . insulin lispro (HUMALOG KWIKPEN) 100 UNIT/ML KiwkPen Take 5 units in the morning, 5 units at noon, --if blood sugar over 100 and you eat > 50% of meals (Patient taking differently: Inject 7-9 Units into the skin 3 (three) times daily. Take 7 units in the morning, 8 units at noon, and 9 units at dinner) 5 pen 3  . isosorbide mononitrate (IMDUR) 30 MG 24 hr tablet Take 1 tablet (30 mg total) by mouth daily. 30 tablet 0  . levothyroxine (SYNTHROID, LEVOTHROID) 88 MCG tablet Take  1 tablet (88 mcg total) by mouth daily. 30 tablet 2  . metolazone (ZAROXOLYN) 2.5 MG tablet 1 tab po every Monday and every Friday 8 tablet 6  . pantoprazole (PROTONIX) 40 MG tablet Take 1 tablet (40 mg total) by mouth daily. 30 tablet 1  . potassium chloride SA (KLOR-CON M20) 20 MEQ tablet Take 1 tablet (20 mEq total) by mouth 2 (two) times daily. 60 tablet 1  . pravastatin (PRAVACHOL) 80 MG tablet Take 1 tablet (80 mg total) by mouth at bedtime. 30 tablet 0  . senna-docusate (SENOKOT-S) 8.6-50 MG per tablet Take 1 tablet by mouth at bedtime as needed for mild constipation. 100 tablet 1  . tiotropium (SPIRIVA HANDIHALER) 18 MCG inhalation capsule PLACE ONE CAPSULE INTO INHALER AND  INHALE  DAILY (Patient taking differently: Place 18 mcg into inhaler and inhale at bedtime. ) 30 capsule 0  . torsemide (DEMADEX) 20 MG tablet 1 tab po bid 60 tablet 2   No current facility-administered medications for this visit.    Allergies:   Niacin    Social History:  The patient  reports that he quit smoking about  14 years ago. His smoking use included Cigarettes. He has a 100 pack-year smoking history. He has never used smokeless tobacco. He reports that he drinks alcohol. He reports that he does not use illicit drugs.   Family History:  The patient's    family history includes Heart attack in his brother; Heart attack (age of onset: 39) in his father.    ROS:  Please see the history of present illness.   Otherwise, review of systems are positive for chronic hearing loss, back pain, balance issues, easy bruising.   All other systems are reviewed and negative.    PHYSICAL EXAM: VS:  BP 100/40 mmHg  Pulse 70  Ht 5\' 7"  (1.702 m)  Wt 137 lb 12.8 oz (62.506 kg)  BMI 21.58 kg/m2 , BMI Body mass index is 21.58 kg/(m^2). GEN: Thin, elderly, in no acute distress Neck: no JVD, HJR, carotid bruits, or masses Cardiac: RRR; 2/6 systolic murmur at the apex, no gallop, rubs, thrill or heave,  Respiratory:  Decreased breath sounds but clear to auscultation bilaterally, normal work of breathing GI: soft, nontender, nondistended, + BS MS: no deformity or atrophy Extremities: without cyanosis, clubbing, edema, good distal pulses bilaterally.  Skin: warm and dry, no rash Neuro:  Strength and sensation are intact    EKG:  EKG is ordered today. The ekg ordered today demonstrates normal sinus rhythm with PACs   Recent Labs: 02/09/2014: Pro B Natriuretic peptide (BNP) 2555.0* 10/09/2014: B Natriuretic Peptide 715.8* 11/03/2014: ALT 20 11/04/2014: TSH 5.302* 11/08/2014: BUN 40*; Creatinine, Ser 2.10*; Hemoglobin 9.5*; Magnesium 1.3*; Platelets 175.0; Potassium 3.2*; Sodium 128*    Lipid Panel    Component Value Date/Time   CHOL 83 02/01/2014 0430   TRIG 102 02/01/2014 0430   TRIG 164* 03/16/2006 0913   HDL 10* 02/01/2014 0430   CHOLHDL 8.3 02/01/2014 0430   CHOLHDL 6.4 CALC 03/16/2006 0913   VLDL 20 02/01/2014 0430   LDLCALC 53 02/01/2014 0430   LDLDIRECT 64.7 12/31/2007 0845      Wt Readings from Last  3 Encounters:  11/13/14 137 lb 12.8 oz (62.506 kg)  11/08/14 139 lb (63.05 kg)  11/03/14 144 lb 14.4 oz (65.726 kg)      Other studies Reviewed: Additional studies/ records that were reviewed today include and review of the records demonstrates:   2D Echo 4/2016Study  Conclusions  - Left ventricle: The cavity size was normal. Wall thickness was   increased in a pattern of mild LVH. Systolic function was mildly   to moderately reduced. The estimated ejection fraction was in the   range of 40% to 45%. There is hypokinesis of the   basal-midinferoseptal myocardium. Features are consistent with a   pseudonormal left ventricular filling pattern, with concomitant   abnormal relaxation and increased filling pressure (grade 2   diastolic dysfunction). - Mitral valve: There was moderate to severe regurgitation. - Left atrium: The atrium was moderately to severely dilated. - Right ventricle: The cavity size was moderately dilated. Wall   thickness was normal. - Right atrium: The atrium was moderately to severely dilated. - Tricuspid valve: There was moderate regurgitation. - Pulmonary arteries: Systolic pressure was severely increased. PA   peak pressure: 78 mm Hg (S).  Impressions:  - EF has improved since last echocardiogram.    ASSESSMENT AND PLAN:  Chronic systolic CHF (congestive heart failure), NYHA class 4 Patient has chronic systolic heart failure and is followed in the heart failure clinic. His diuretics were increased but he ended up in the hospital with dehydration. It's a very fine balance for this patient. His weight is down to 137 pounds which is down 10 pounds from his heart failure clinic visit. I suspect he is on the dry side. Unfortunately he  Already took metolazone today. We'll hold torsemide tonight and metolazone on Friday. They will weigh him daily and make adjustments if needed. He has an appointment with the heart failure clinic next week. I will recheck his  potassium and renal function today.  Essential hypertension Blood pressure on the low side  Atrial flutter Eliquis stopped  Cardiomyopathy, ischemic Stable without chest pain. EF 40-45% on most recent 2-D echo on 08/2014    Signed, Ermalinda Barrios, PA-C  11/13/2014 12:31 PM    [address], [place], [place]  [address] Phone: [phone]; Fax: [phone]

## 2014-11-13 NOTE — Assessment & Plan Note (Signed)
Patient has chronic systolic heart failure and is followed in the heart failure clinic. His diuretics were increased but he ended up in the hospital with dehydration. It's a very fine balance for this patient. His weight is down to 137 pounds which is down 10 pounds from his heart failure clinic visit. I suspect he is on the dry side. Unfortunately he  Already took metolazone today. We'll hold torsemide tonight and metolazone on Friday. They will weigh him daily and make adjustments if needed. He has an appointment with the heart failure clinic next week. I will recheck his potassium and renal function today.

## 2014-11-13 NOTE — Assessment & Plan Note (Signed)
Eliquis stopped

## 2014-11-13 NOTE — Patient Instructions (Addendum)
Medication Instructions:   HOLD TORSEMIDE TONIGHT   HOLD ZAROXOLYN Friday   Labwork:  BMET   Testing/Procedures:   Follow-Up  ALREADY SCHEDULED   Any Other Special Instructions Will Be Listed Below (If Applicable).

## 2014-11-13 NOTE — Assessment & Plan Note (Signed)
Stable without chest pain. EF 40-45% on most recent 2-D echo on 08/2014

## 2014-11-13 NOTE — Assessment & Plan Note (Signed)
Blood pressure on the low side. 

## 2014-11-14 ENCOUNTER — Telehealth: Payer: Self-pay | Admitting: *Deleted

## 2014-11-14 NOTE — Telephone Encounter (Signed)
-----   Message from Imogene Burn, PA-C sent at 11/14/2014  7:43 AM EDT ----- Sodium low and potassium low. Continue to restrict fluid intake. Hold both doses of torsemide today. Have home health check him tomorrow. Take potassium 20 meq 2 tablets twice today then go back to regular dose tomorrow. If he doesn't improve he may need to come back to the hospital.

## 2014-11-15 ENCOUNTER — Telehealth: Payer: Self-pay | Admitting: *Deleted

## 2014-11-15 NOTE — Telephone Encounter (Signed)
Per Dr. Anitra Lauth okay for home health for wound care. Try again tomorrow to get blood for CMP and CBC. If no fever or shortness of breath then then no treatment at this time. If pt develops fever, shortness of breath or any other symptom he should go to Urgent Care or ER for treatment. Margaretha Sheffield advised and voiced understanding and will advise pt and his wife.

## 2014-11-15 NOTE — Telephone Encounter (Signed)
Margaretha Sheffield with [facility] called stating that during her visit with pt today he told her that he feel on observation she noted skin tare on right antecubital and on his left elbow. She stated that pt thinks he may have hit his head on the wall when he fell but could not remember. She stated that she could not papulate any bumps/lesions. She is requesting order for home health to do wound care for pt. Also stated that pt has a productive cough with green thick medium sized sputum. She states that pt could not recall how long he had this cough but that he not have in when he was last in with Dr. Anitra Lauth. She stated that she was unable to get blood for labs today. Please advise. Thanks. CB: [phone]

## 2014-11-15 NOTE — Telephone Encounter (Signed)
Order needs to come from MD. Pls attempt to contact Dr Marcille Buffy re verbal order for Home health & how he wants to proceed w/monitoring Hgb, given RN unable to get blood today.

## 2014-11-15 NOTE — Telephone Encounter (Signed)
Left message on Dr. Isla Pence cell to call back.

## 2014-11-17 ENCOUNTER — Encounter: Payer: Self-pay | Admitting: Nurse Practitioner

## 2014-11-17 ENCOUNTER — Ambulatory Visit (INDEPENDENT_AMBULATORY_CARE_PROVIDER_SITE_OTHER): Payer: Medicare Other | Admitting: Nurse Practitioner

## 2014-11-17 VITALS — BP 135/60 | HR 69 | Temp 97.5°F | Resp 18 | Wt 139.0 lb

## 2014-11-17 DIAGNOSIS — I255 Ischemic cardiomyopathy: Secondary | ICD-10-CM | POA: Diagnosis not present

## 2014-11-17 DIAGNOSIS — E871 Hypo-osmolality and hyponatremia: Secondary | ICD-10-CM

## 2014-11-17 DIAGNOSIS — J189 Pneumonia, unspecified organism: Secondary | ICD-10-CM | POA: Diagnosis not present

## 2014-11-17 MED ORDER — SODIUM CHLORIDE 1 G PO TABS: 1.0000 g | ORAL_TABLET | Freq: Every day | ORAL | Status: DC

## 2014-11-17 MED ORDER — DOXYCYCLINE HYCLATE 100 MG PO TABS: 100.0000 mg | ORAL_TABLET | Freq: Two times a day (BID) | ORAL | Status: DC

## 2014-11-17 MED ORDER — GUAIFENESIN ER 600 MG PO TB12: 600.0000 mg | ORAL_TABLET | Freq: Two times a day (BID) | ORAL | Status: AC

## 2014-11-17 NOTE — Progress Notes (Signed)
Pre visit review using our clinic review tool, if applicable. No additional management support is needed unless otherwise documented below in the visit note. 

## 2014-11-17 NOTE — Patient Instructions (Signed)
Start antibiotic. Start guaifenesen to break up congestion. Hold dose of metolozone today. Take torsemide once today, twice tomorrow, once on Sunday and Monday, then back to twice daily on Tuesday. Take salt tablet once daily for 5 days. Take 250 mg magnesium at bedtime.  Please see cardiology next week See [place] in about 10 days.  If you become short of breath or decreased alertness go to ER.

## 2014-11-17 NOTE — Progress Notes (Signed)
Subjective:     [name] is a 79 y.o. male who presents for evaluation of green sputum, progressive weakness fallen 3 times in 1 week-last fall this am. . Accompanied by wife.  Pt had pneumonia twice in last month. Recent hospitalization. Recent drop in Hgb, low sodium & potassium. Hf & CKD 3, DM.  Saw cardiology few days ago, potassium increased, torsemide held. HH coming out qd. Labs drawn yesterday: hgb 10.5-stable. Potassium improved 3.8, sodium still low at 126. WBC 9.1-improved. Pt & wife say he is eating & drinking. Denies fever, HA, cp, SOB. Wife concerned that weakness seems to progress-fell this am. Pt denies injury-says slipped in urine. Wife says pt is asking for order for w/c.  The following portions of the patient's history were reviewed and updated as appropriate: allergies, current medications, past medical history, past social history, past surgical history and problem list.  Review of Systems Pertinent items are noted in HPI.    Objective:    BP 135/60 mmHg  Pulse 69  Temp(Src) 97.5 F (36.4 C) (Oral)  Resp 18  Wt 139 lb (63.05 kg)  SpO2 97% General appearance: alert, cooperative, appears stated age, fatigued, no distress and weak, flushed, sleepy Eyes: negative findings: lids and lashes normal and conjunctivae and sclerae normal Lungs: clear to auscultation bilaterally Heart: regular rate and rhythm, S1, S2 normal, no murmur, click, rub or gallop Abdomen: slight distension Extremities: edema L LE only +1 nonpitting Pulses: 2+ and symmetric Neurologic: Mental status: Alert, oriented, thought content appropriate, alertness: initially kept eyes closed, then seemed more awake & coversive. using walker    Assessment:Plan  1. CAP (community acquired pneumonia) Not verified by xray - doxycycline (VIBRA-TABS) 100 MG tablet; Take 1 tablet (100 mg total) by mouth 2 (two) times daily.  Dispense: 20 tablet; Refill: 0 - guaiFENesin (MUCINEX) 600 MG 12 hr tablet;  Take 1 tablet (600 mg total) by mouth 2 (two) times daily.  Dispense: 30 tablet; Refill: 0  2. Hyponatremia start - sodium chloride 1 G tablet; Take 1 tablet (1 g total) by mouth daily.  Dispense: 5 tablet; Refill: 0 See instructions to modify diuretic Pt to see HF clinic in 4 d Go to ER sob, increase swelling LE or abdomen, decreased LOC

## 2014-11-20 ENCOUNTER — Ambulatory Visit (HOSPITAL_COMMUNITY)
Admission: RE | Admit: 2014-11-20 | Discharge: 2014-11-20 | Disposition: A | Discharge status: Home / Self Care | Payer: Medicare Other | Source: Ambulatory Visit | Attending: Internal Medicine | Admitting: Internal Medicine

## 2014-11-20 ENCOUNTER — Encounter (HOSPITAL_COMMUNITY): Payer: Self-pay

## 2014-11-20 ENCOUNTER — Telehealth: Payer: Self-pay | Admitting: Family Medicine

## 2014-11-20 VITALS — BP 102/58 | HR 70 | Wt 146.2 lb

## 2014-11-20 DIAGNOSIS — E11319 Type 2 diabetes mellitus with unspecified diabetic retinopathy without macular edema: Secondary | ICD-10-CM | POA: Insufficient documentation

## 2014-11-20 DIAGNOSIS — I129 Hypertensive chronic kidney disease with stage 1 through stage 4 chronic kidney disease, or unspecified chronic kidney disease: Secondary | ICD-10-CM | POA: Insufficient documentation

## 2014-11-20 DIAGNOSIS — E871 Hypo-osmolality and hyponatremia: Secondary | ICD-10-CM | POA: Diagnosis not present

## 2014-11-20 DIAGNOSIS — R188 Other ascites: Secondary | ICD-10-CM | POA: Diagnosis not present

## 2014-11-20 DIAGNOSIS — I4892 Unspecified atrial flutter: Secondary | ICD-10-CM | POA: Insufficient documentation

## 2014-11-20 DIAGNOSIS — Z794 Long term (current) use of insulin: Secondary | ICD-10-CM | POA: Insufficient documentation

## 2014-11-20 DIAGNOSIS — I5022 Chronic systolic (congestive) heart failure: Secondary | ICD-10-CM | POA: Diagnosis not present

## 2014-11-20 DIAGNOSIS — J449 Chronic obstructive pulmonary disease, unspecified: Secondary | ICD-10-CM | POA: Diagnosis not present

## 2014-11-20 DIAGNOSIS — I340 Nonrheumatic mitral (valve) insufficiency: Secondary | ICD-10-CM | POA: Insufficient documentation

## 2014-11-20 DIAGNOSIS — K254 Chronic or unspecified gastric ulcer with hemorrhage: Secondary | ICD-10-CM | POA: Diagnosis not present

## 2014-11-20 DIAGNOSIS — Z95810 Presence of automatic (implantable) cardiac defibrillator: Secondary | ICD-10-CM | POA: Insufficient documentation

## 2014-11-20 DIAGNOSIS — I480 Paroxysmal atrial fibrillation: Secondary | ICD-10-CM | POA: Diagnosis not present

## 2014-11-20 DIAGNOSIS — I255 Ischemic cardiomyopathy: Secondary | ICD-10-CM | POA: Insufficient documentation

## 2014-11-20 DIAGNOSIS — I2510 Atherosclerotic heart disease of native coronary artery without angina pectoris: Secondary | ICD-10-CM | POA: Diagnosis not present

## 2014-11-20 DIAGNOSIS — Z951 Presence of aortocoronary bypass graft: Secondary | ICD-10-CM | POA: Insufficient documentation

## 2014-11-20 DIAGNOSIS — Z79899 Other long term (current) drug therapy: Secondary | ICD-10-CM | POA: Insufficient documentation

## 2014-11-20 DIAGNOSIS — I4891 Unspecified atrial fibrillation: Secondary | ICD-10-CM | POA: Insufficient documentation

## 2014-11-20 DIAGNOSIS — N183 Chronic kidney disease, stage 3 (moderate): Secondary | ICD-10-CM | POA: Diagnosis not present

## 2014-11-20 DIAGNOSIS — E1122 Type 2 diabetes mellitus with diabetic chronic kidney disease: Secondary | ICD-10-CM | POA: Diagnosis not present

## 2014-11-20 DIAGNOSIS — E780 Pure hypercholesterolemia: Secondary | ICD-10-CM | POA: Insufficient documentation

## 2014-11-20 LAB — BASIC METABOLIC PANEL
ANION GAP: 15 (ref 5–15)
BUN: 35 mg/dL — ABNORMAL HIGH (ref 6–20)
CHLORIDE: 84 mmol/L — AB (ref 101–111)
CO2: 24 mmol/L (ref 22–32)
Calcium: 8.3 mg/dL — ABNORMAL LOW (ref 8.9–10.3)
Creatinine, Ser: 2.17 mg/dL — ABNORMAL HIGH (ref 0.61–1.24)
GFR calc Af Amer: 32 mL/min — ABNORMAL LOW (ref >60)
GFR calc non Af Amer: 28 mL/min — ABNORMAL LOW (ref >60)
Glucose, Bld: 156 mg/dL — ABNORMAL HIGH (ref 65–99)
Potassium: 3.5 mmol/L (ref 3.5–5.1)
SODIUM: 123 mmol/L — AB (ref 135–145)

## 2014-11-20 NOTE — Progress Notes (Signed)
Patient ID: LINN GOETZE, male   DOB: [birth year], 79 y.o.   MRN: [id number]  PCP: Dr Ernestine Conrad GI: Dr Earlean Shawl.    HPI: Mr. Schwenke is a 79 yo male with a history of HTN, CAD s/p CABG, ICM s/p Medtronic ICD, chronic systolic heart failure, DM2, Vtach, atrial flutter, PVD and GI mass. ECHO (12/22/13) EF 20-25%, Echo  EF 40% (12/15)  Mod-severe MR.   On September 1st he was admitted from Dr. Rosezella [place] office with volume overload and low output heart failure. Creatinine was 2.8 on admit (baseline 1.6). Central line was placed to monitor CVP and CO-OX. Initial CO-OX was 49% and he was started on Milrinone and IV lasix. Discharge weight was 151 pounds. Creatinine was 1.42.   Saw Jeff Medoff  for possible colon mass/thickening and felt to be result of previous diverticulitis.   Was admitted 2x in April. In early April had a/c CHF with cardiorenal syndrome. Treated with milrinone and improved. Also had GI bleed at that time and EGD with candidal esophagitis. Also two colonic AVMs. Eliquis eventually stopped due to recurrent bleeding and falls. In late April admitted with PNA. Treated with abx. D/c weight was 147  He returns for HF follow up: Complaining of fatigue. 2 falls last week.. Last week he was evaluated by PCP and started on doxycycline for URI. Also started on salt tablet for hyponatremia. Denies SOB/PND/Orthopnea. Weight at home 132-140 pounds. Appetite ok.  No melena. Using 2 liters [place] oxygen at night. Taking all medications. Followed by [facility].   Optivol-  No VT/AF. Fluid well below threshold.   Echo 12/15 EF  ~40% inferior AK moderate to severe MR. RV mildly HK Echo 4/16  EF 40-45% moderate to severe MR RV moderate dysfunction RVSP 78  Labs 02/09/14 K 3.9 Creatinine 1.38 Pro BNP 2555  Labs 02/20/14 K 3.5 NA 127 Creatinine 1.6  03/01/14 TSH 19.8  T4  1.03 T3 84  Labs 03/03/14 CEA 9.0  K 4.3 creatinine 1.2 NA 134 Hgb 12.7 TSH 19.8 T3 84.9 T4 1.03  Labs 09/20/2014: K 3.3 Creatinine 2.05  Labs  09/27/2014: Na 124 K 3.6 Creatinine 1.88 Labs 11/13/2014 K 3.1 Creatinine 2.05  Na 128   CT Virtual Colonoscopy- Diverticulosis ? Rectosigmoid Colon Ca  ROS: All systems negative except as listed in HPI, PMH and Problem List.  Past Medical History  Diagnosis Date  . VENTRICULAR TACHYCARDIA   . PERIPHERAL VASCULAR DISEASE   . HYPERTENSION     Hypertensive retinopathy-grade II OU  . HYPERCHOLESTEROLEMIA   . CORONARY ARTERY DISEASE     Hx of CABG  . Chronic systolic heart failure     Ischemic cardiomyopathy (01/2014 EF 40-45% with hypokinesis + small area of akinesis  . SEBORRHEIC DERMATITIS   . Chronic renal insufficiency, stage III (moderate)     CrCl 40s-50s  . DIVERTICULOSIS OF COLON   . COPD, severe   . COLONIC POLYPS   . ANXIETY   . ICD (implantable cardiac defibrillator) in place   . Type II diabetes mellitus     +background diabetic retinopathy OU  . Chronic lower back pain   . DJD (degenerative joint disease)     "hands" (05/24/2012)  . Osteoarthritis of finger   . History of atrial flutter   . Thrombus 12/2013    on ICD lead--started anticoag and his cardioversion for atrial flutter was postponed.  . Colon stricture 2015    with features worrisome for mass, also with recent rising CEA  level (possible colon cancer)-GI MD= Dr. Jackelyn Hoehn has been declining colonoscopy since that time.  Dr. Liliane Channel impression on re-eval in 2015 was that the area represented chronic post-diverticulitis changes, not malignancy.  . Macular degeneration, age related, nonexudative     OU  . Chronic blood loss anemia     Transfused 2 U pRBCs April, May, and June (10/26/14) of 2016.  GI w/u has revealed only 2 tiny non-bleeding AVMs in mid small bowel.    Current Outpatient Prescriptions  Medication Sig Dispense Refill  . amiodarone (PACERONE) 200 MG tablet Take 1 tablet (200 mg total) by mouth daily. 30 tablet 0  . doxycycline (VIBRA-TABS) 100 MG tablet Take 1 tablet (100 mg total) by mouth 2  (two) times daily. 20 tablet 0  . Fluticasone-Salmeterol (ADVAIR) 500-50 MCG/DOSE AEPB Inhale 1 puff into the lungs 2 (two) times daily. 60 each 11  . folic acid (FOLVITE) 1 MG tablet Take 1 tablet (1 mg total) by mouth daily. 30 tablet 0  . guaiFENesin (MUCINEX) 600 MG 12 hr tablet Take 1 tablet (600 mg total) by mouth 2 (two) times daily. 30 tablet 0  . hydrALAZINE (APRESOLINE) 25 MG tablet Take 1 tablet (25 mg total) by mouth 3 (three) times daily. 90 tablet 1  . Insulin Glargine (LANTUS SOLOSTAR) 100 UNIT/ML Solostar Pen 10 units SQ qhs (Patient taking differently: Inject 10 Units into the skin at bedtime. ) 15 mL 11  . insulin lispro (HUMALOG KWIKPEN) 100 UNIT/ML KiwkPen Take 5 units in the morning, 5 units at noon, --if blood sugar over 100 and you eat > 50% of meals (Patient taking differently: Inject 7-9 Units into the skin 3 (three) times daily. Take 7 units in the morning, 8 units at noon, and 9 units at dinner) 5 pen 3  . isosorbide mononitrate (IMDUR) 30 MG 24 hr tablet Take 1 tablet (30 mg total) by mouth daily. 30 tablet 0  . levothyroxine (SYNTHROID, LEVOTHROID) 88 MCG tablet Take 1 tablet (88 mcg total) by mouth daily. 30 tablet 2  . pantoprazole (PROTONIX) 40 MG tablet Take 1 tablet (40 mg total) by mouth daily. 30 tablet 1  . potassium chloride SA (KLOR-CON M20) 20 MEQ tablet Take 1 tablet (20 mEq total) by mouth 2 (two) times daily. 60 tablet 1  . pravastatin (PRAVACHOL) 80 MG tablet Take 1 tablet (80 mg total) by mouth at bedtime. 30 tablet 0  . senna-docusate (SENOKOT-S) 8.6-50 MG per tablet Take 1 tablet by mouth at bedtime as needed for mild constipation. 100 tablet 1  . sodium chloride 1 G tablet Take 1 tablet (1 g total) by mouth daily. 5 tablet 0  . tiotropium (SPIRIVA HANDIHALER) 18 MCG inhalation capsule PLACE ONE CAPSULE INTO INHALER AND  INHALE  DAILY (Patient taking differently: Place 18 mcg into inhaler and inhale at bedtime. ) 30 capsule 0  . torsemide (DEMADEX) 20 MG  tablet 1 tab po bid 60 tablet 2  . metolazone (ZAROXOLYN) 2.5 MG tablet 1 tab po every Monday and every Friday (Patient not taking: Reported on 11/20/2014) 8 tablet 6   No current facility-administered medications for this encounter.     PHYSICAL EXAM: Filed Vitals:   11/20/14 1410  BP: 102/58  Pulse: 70  Weight: 146 lb 4 oz (66.339 kg)  SpO2: 99%    General:  Frail appearing No resp difficulty. Wife present  HEENT: normal Neck: supple. JVP 6-7. No bruits. No lymphadenopathy or thryomegaly appreciated. Cor: PMI normal. Regular rate &  rhythm. No rubs, gallops or murmurs. Lungs: clear Abdomen: soft, nontender, ++distended. No hepatosplenomegaly. No bruits or masses. Good bowel sounds. Extremities: no cyanosis, clubbing, rash, R and LLE 2-3+ edema Neuro: alert & orientedx3, cranial nerves grossly intact. Moves all 4 extremities w/o difficulty. Affect pleasant.   ASSESSMENT & PLAN: Mr Garrel Ridgel is a 79 year old with ischemic cardiomyopathy and chronic systolic heart failure recently admitted with cardiogenic shock that required short term milrinone. 1. Chronic Systolic Heart Failure with R> L symptoms.  Ischemic cardiomyopathy. Has Medtronic ICD. ECHO 4/16 EF 40-45%. NYHA III-IIIB -Volume status stable. Continue torsemide to 20 mg twice a day and continue metolazone 2.5 mg twice a week. - Continue carvedilol to 3.125 bid - Continue Hydralazine 25 mg tid.  - He is not Ace or spiro in past due to CKD.   Discussed cardiomems device. Will submit for prior auth.  2. A fib/Aflutter: Maintaining SR.   - Continue amiodarone 200 mg daily. Recent LFTs ok. Will need yearly eye exams.  - Eliquis stopped due to recurrent GI bleeding and falls 3. Mitral regurgitation - moderate to severe due to restriction of posterior MV leaflet. We discussed the possibility of TEE and possible repair. However, I think a major surgery like that could potentially be a major setback for him especially with his COPD.  We will follow closely with surveillance echos and if LV dilating then will plan TEE to further evaluate.  4. CKD: Stable III. Check labs today.  5. DM: Per PCP.  6. CAD: s/p CABG.  Continue pravastatin.  Not on ASA with apixaban use.  7. COPD 8. H/o VT: He has an ICD and is now on amiodarone.  Stable 9. Hyponatremia 10. Ascites ? -- Send for paracentesis.   BMET today. Follow up in 6 weeks.    CLEGG,AMY  NP-C 2:13 PM  Patient seen and examined with Darrick Grinder, NP. We discussed all aspects of the encounter. I agree with the assessment and plan as stated above.   Overall volume status looks ok on exam and by Optivol but seems to have residual ascites which I suspect may be from cardiac cirrhosis/RV failure. Will send to IR for u/s and possible paracentesis. Stop salt tabs.  Remains in NSR on amio. Off Eliquis due to recurrent bleeding and falls. ICD interrogated personally. No VT/AF. Optivol ok.   Overall I am concerned that his overall health continues to decline.  Lavern Crimi,MD 1:29 AM

## 2014-11-20 NOTE — Patient Instructions (Signed)
Your physician has requested that you have an ultrasound guided paracentesis  STOP Table Salt (Sodium Cl)  Labs today  Your physician recommends that you schedule a follow-up appointment in: 6 weeks  Do the following things EVERYDAY: 1) Weigh yourself in the morning before breakfast. Write it down and keep it in a log. 2) Take your medicines as prescribed 3) Eat low salt foods-Limit salt (sodium) to 2000 mg per day.  4) Stay as active as you can everyday 5) Limit all fluids for the day to less than 2 liters 6)

## 2014-11-20 NOTE — Telephone Encounter (Signed)
Pls notify pt that his Hb last week on 11/16/14 was 10.5. Since this is stable I think it is best for him to go ahead and restart his eliquis 2.5mg  twice a day. He should have this med already but if needs RF's let me know. Need to recheck CBC no diff in 10-14d (lab visit only, dx is chronic blood loss anemia, also atrial flutter)-thx

## 2014-11-20 NOTE — Telephone Encounter (Signed)
Left message for pt to call back  °

## 2014-11-21 ENCOUNTER — Encounter (HOSPITAL_COMMUNITY): Payer: Medicare Other

## 2014-11-21 DIAGNOSIS — R188 Other ascites: Secondary | ICD-10-CM | POA: Insufficient documentation

## 2014-11-22 ENCOUNTER — Encounter: Payer: Self-pay | Admitting: Family Medicine

## 2014-11-22 ENCOUNTER — Telehealth: Payer: Self-pay | Admitting: Family Medicine

## 2014-11-22 ENCOUNTER — Other Ambulatory Visit: Payer: Self-pay | Admitting: Family Medicine

## 2014-11-22 ENCOUNTER — Ambulatory Visit (INDEPENDENT_AMBULATORY_CARE_PROVIDER_SITE_OTHER): Payer: Medicare Other | Admitting: Family Medicine

## 2014-11-22 ENCOUNTER — Ambulatory Visit (HOSPITAL_BASED_OUTPATIENT_CLINIC_OR_DEPARTMENT_OTHER)
Admission: RE | Admit: 2014-11-22 | Discharge: 2014-11-22 | Disposition: A | Discharge status: Home / Self Care | Payer: Medicare Other | Source: Ambulatory Visit | Attending: Family Medicine | Admitting: Family Medicine

## 2014-11-22 VITALS — BP 114/56 | HR 76 | Temp 97.9°F | Resp 16 | Ht 67.0 in | Wt 146.0 lb

## 2014-11-22 DIAGNOSIS — I482 Chronic atrial fibrillation, unspecified: Secondary | ICD-10-CM

## 2014-11-22 DIAGNOSIS — I5022 Chronic systolic (congestive) heart failure: Secondary | ICD-10-CM | POA: Diagnosis not present

## 2014-11-22 DIAGNOSIS — R0781 Pleurodynia: Secondary | ICD-10-CM | POA: Insufficient documentation

## 2014-11-22 DIAGNOSIS — Z7901 Long term (current) use of anticoagulants: Secondary | ICD-10-CM

## 2014-11-22 DIAGNOSIS — I255 Ischemic cardiomyopathy: Secondary | ICD-10-CM | POA: Diagnosis not present

## 2014-11-22 DIAGNOSIS — N183 Chronic kidney disease, stage 3 unspecified: Secondary | ICD-10-CM

## 2014-11-22 DIAGNOSIS — D500 Iron deficiency anemia secondary to blood loss (chronic): Secondary | ICD-10-CM | POA: Diagnosis not present

## 2014-11-22 MED ORDER — FOLIC ACID 1 MG PO TABS
1.0000 mg | ORAL_TABLET | Freq: Every day | ORAL | Status: DC
Start: 2014-11-22 — End: 2015-02-13

## 2014-11-22 NOTE — Progress Notes (Signed)
Pre visit review using our clinic review tool, if applicable. No additional management support is needed unless otherwise documented below in the visit note. 

## 2014-11-22 NOTE — Telephone Encounter (Signed)
Pls notify pt/wife that I talked with his heart failure doctor, Dr. Jeffie Pollock, and he said that [place] is NO LONGER a candidate for any anticoagulant, so he will NOT BE restarting eliquis.--thx

## 2014-11-22 NOTE — Telephone Encounter (Signed)
Pts wife advised and voiced understanding, okay per DPR. 

## 2014-11-22 NOTE — Telephone Encounter (Signed)
Pt and pts wife advised by Dr. Anitra Lauth during ov today.

## 2014-11-22 NOTE — Progress Notes (Signed)
OFFICE NOTE  11/22/2014  CC:  Chief Complaint  Patient presents with  . Follow-up    2 week f/u. Pt is not fasting.   HPI: Patient is a 79 y.o. Caucasian male who is here for 2 week f/u iron def anemia due to chronic blood loss from SB AVMs, chronic systolic HF (NYHA class IV). Saw CHF MD 2 days ago and due to worry about growing ascites he was sent for u/s guided paracentesis: this is thought to be from RV failure and secondary hepatic cirrhosis/congestion. He was felt to be at relatively stable condition regarding volume otherwise, diuretics were not changed. He remaines in NSR on amiodarone.  Was started on antibiotic (doxy) 11/17/14 for cough and the character of the sputum is less green but cough is unchanged. Says SOB is "about the same as always".  No fevers.  Has been having intermittent L upper abd/lower chest wall.  No n/v, no worsening with food.  BM's normal, no constipation.  He does not drink alcohol.  Hurts worse with movements/cough. Eating and drinking fine.    No melena, hematochezia, gross hematuria, or nosebleeds.  His glucoses at home are rarely checked, compliant with meds.  Pertinent PMH:  Past medical, surgical, social, and family history reviewed and no changes are noted since last office visit.  MEDS:  Outpatient Prescriptions Prior to Visit  Medication Sig Dispense Refill  . amiodarone (PACERONE) 200 MG tablet Take 1 tablet (200 mg total) by mouth daily. 30 tablet 0  . doxycycline (VIBRA-TABS) 100 MG tablet Take 1 tablet (100 mg total) by mouth 2 (two) times daily. 20 tablet 0  . Fluticasone-Salmeterol (ADVAIR) 500-50 MCG/DOSE AEPB Inhale 1 puff into the lungs 2 (two) times daily. 60 each 11  . folic acid (FOLVITE) 1 MG tablet Take 1 tablet (1 mg total) by mouth daily. 30 tablet 0  . guaiFENesin (MUCINEX) 600 MG 12 hr tablet Take 1 tablet (600 mg total) by mouth 2 (two) times daily. 30 tablet 0  . hydrALAZINE (APRESOLINE) 25 MG tablet Take 1 tablet (25 mg  total) by mouth 3 (three) times daily. 90 tablet 1  . Insulin Glargine (LANTUS SOLOSTAR) 100 UNIT/ML Solostar Pen 10 units SQ qhs (Patient taking differently: Inject 10 Units into the skin at bedtime. ) 15 mL 11  . insulin lispro (HUMALOG KWIKPEN) 100 UNIT/ML KiwkPen Take 5 units in the morning, 5 units at noon, --if blood sugar over 100 and you eat > 50% of meals (Patient taking differently: Inject 7-9 Units into the skin 3 (three) times daily. Take 7 units in the morning, 8 units at noon, and 9 units at dinner) 5 pen 3  . isosorbide mononitrate (IMDUR) 30 MG 24 hr tablet Take 1 tablet (30 mg total) by mouth daily. 30 tablet 0  . levothyroxine (SYNTHROID, LEVOTHROID) 88 MCG tablet Take 1 tablet (88 mcg total) by mouth daily. 30 tablet 2  . metolazone (ZAROXOLYN) 2.5 MG tablet 1 tab po every Monday and every Friday 8 tablet 6  . pantoprazole (PROTONIX) 40 MG tablet Take 1 tablet (40 mg total) by mouth daily. 30 tablet 1  . potassium chloride SA (KLOR-CON M20) 20 MEQ tablet Take 1 tablet (20 mEq total) by mouth 2 (two) times daily. 60 tablet 1  . pravastatin (PRAVACHOL) 80 MG tablet Take 1 tablet (80 mg total) by mouth at bedtime. 30 tablet 0  . senna-docusate (SENOKOT-S) 8.6-50 MG per tablet Take 1 tablet by mouth at bedtime as needed for  mild constipation. 100 tablet 1  . tiotropium (SPIRIVA HANDIHALER) 18 MCG inhalation capsule PLACE ONE CAPSULE INTO INHALER AND  INHALE  DAILY (Patient taking differently: Place 18 mcg into inhaler and inhale at bedtime. ) 30 capsule 0  . torsemide (DEMADEX) 20 MG tablet 1 tab po bid 60 tablet 2   No facility-administered medications prior to visit.    PE: Blood pressure 114/56, pulse 76, temperature 97.9 F (36.6 C), temperature source Oral, resp. rate 16, height 5\' 7"  (1.702 m), weight 146 lb (66.225 kg), SpO2 98 %. Gen: weak/frail-appearing but in NAD.  Alert, oriented x 4. Pleasant, lucid thought and speech. ENT: oral mucosa moist. CV: RRR, soft systolic  murmur, no rub or gallop. Chest is clear, no wheezing or rales. Normal symmetric air entry throughout both lung fields. No chest wall deformities or tenderness. ABD: mild distention, tender in LUQ, no guarding or rebound or mass or palpable HSM. Mild TTP over anterior aspect of lower ribs on left side EXT: no clubbing or cyanosis.  2+ pitting edema bilat LL's. LABS: Lab Results  Component Value Date   HGBA1C 5.1 11/04/2014      Chemistry      Component Value Date/Time   NA 123* 11/20/2014 1443   K 3.5 11/20/2014 1443   CL 84* 11/20/2014 1443   CO2 24 11/20/2014 1443   BUN 35* 11/20/2014 1443   CREATININE 2.17* 11/20/2014 1443      Component Value Date/Time   CALCIUM 8.3* 11/20/2014 1443   ALKPHOS 95 11/03/2014 1930   AST 52* 11/03/2014 1930   ALT 20 11/03/2014 1930   BILITOT 1.7* 11/03/2014 1930     Lab Results  Component Value Date   WBC 10.6* 11/08/2014   HGB 9.5* 11/08/2014   HCT 29.3* 11/08/2014   MCV 88.3 11/08/2014   PLT 175.0 11/08/2014   Hb via [facility] nursing on 11/16/14 was 10.5.  IMPRESSION AND PLAN:  1) Iron def anemia from chronic GI blood loss/SB AVMs: has had stable Hb s/p recent d/c of anticoagulant and getting transfusion.  Discussed with Dr. Jeffie Pollock today and he felt like patient is no longer going to be a candidate for anticoagulant, so we will NOT restart eliquis today. I'll see him back in 2 wks to recheck in office and recheck CBC.  2) Chronic systolic HF, R>L symptoms, fairly stable on current regimen. However, given his ascites, I agree with Dr. Damaris Schooner recent ordering of u/s-guided paracentesis.  3) LUQ pain/tenderness: ? Splenomegaly/splenic distention?: will have radiology do limited CT abd to look at spleen when they do his u/s-guided paracentesis on 11/28/14. Check rib x-ray of left side to r/o rib fracture that he may have sustained with recent coughing.  4) COPD exacerbation: improving minimally (productive cough is his only symptom)  since getting on doxy. I think he is displaying his chronic/usual/baseline COPD sx's.  5) DM 2, good control.  Continue current meds.  An After Visit Summary was printed and given to the patient.  Spent 35 min with pt today, with >50% of this time spent in counseling and care coordination regarding the above problems.  FOLLOW UP: 2 weeks

## 2014-11-28 ENCOUNTER — Ambulatory Visit (HOSPITAL_COMMUNITY)
Admission: RE | Admit: 2014-11-28 | Discharge: 2014-11-28 | Disposition: A | Discharge status: Home / Self Care | Payer: Medicare Other | Source: Ambulatory Visit | Attending: Internal Medicine | Admitting: Internal Medicine

## 2014-11-28 DIAGNOSIS — R188 Other ascites: Secondary | ICD-10-CM | POA: Diagnosis present

## 2014-11-28 MED ORDER — LIDOCAINE HCL (PF) 1 % IJ SOLN
INTRAMUSCULAR | Status: AC
  Filled 2014-11-28: qty 10

## 2014-11-28 NOTE — Procedures (Signed)
  US guided RLQ para 600 cc yellow fluid  No labs per MD  Tolerated well  Fluid still remains in abd Redirected Yueh cath twice but kept attaching to bowel Deeper pocket seen but liver in window

## 2014-11-29 ENCOUNTER — Other Ambulatory Visit (HOSPITAL_COMMUNITY): Payer: Self-pay

## 2014-11-29 ENCOUNTER — Telehealth (HOSPITAL_COMMUNITY): Payer: Self-pay

## 2014-11-29 MED ORDER — VITAMIN B-1 100 MG PO TABS: 100.0000 mg | ORAL_TABLET | Freq: Every day | ORAL | Status: DC

## 2014-11-29 NOTE — Telephone Encounter (Signed)
Patient [name] called to report patient orthostatic... Sitting 110/50 Standing 70/40 Patient very symptomatic, dizzy, no appetite, has fallen twice in past 3 days, skin tears all over arms, scattered rales through lungs bilaterally, weight 136 (10 lbs down from last week), generalized 2-3+ edema.  Patient was taken for paracentesis yesterday with increased abdominal distention but only able to draw off 500 cc.  Per Dr. Aundra Dubin, advised patient to stop imdur and hydralazine and update [place] Friday.  If patient does not feel any better after med adjustments, or if he feels worse, patient should report to ED.  Aware and agreeable.  [place] RN to reach out to patient's PCP for wound care orders to address skin tears.  [name]

## 2014-12-02 ENCOUNTER — Telehealth: Payer: Self-pay | Admitting: Physician Assistant

## 2014-12-02 ENCOUNTER — Inpatient Hospital Stay (HOSPITAL_COMMUNITY)
Admission: EM | Admit: 2014-12-02 | Discharge: 2014-12-05 | DRG: 312 | Disposition: A | Discharge status: Home / Self Care | Payer: Medicare Other | Attending: Internal Medicine | Admitting: Internal Medicine

## 2014-12-02 ENCOUNTER — Encounter (HOSPITAL_COMMUNITY): Payer: Self-pay

## 2014-12-02 ENCOUNTER — Emergency Department (HOSPITAL_COMMUNITY): Payer: Medicare Other

## 2014-12-02 DIAGNOSIS — N189 Chronic kidney disease, unspecified: Secondary | ICD-10-CM | POA: Diagnosis not present

## 2014-12-02 DIAGNOSIS — J449 Chronic obstructive pulmonary disease, unspecified: Secondary | ICD-10-CM | POA: Diagnosis present

## 2014-12-02 DIAGNOSIS — Z86010 Personal history of colonic polyps: Secondary | ICD-10-CM | POA: Diagnosis not present

## 2014-12-02 DIAGNOSIS — I482 Chronic atrial fibrillation, unspecified: Secondary | ICD-10-CM

## 2014-12-02 DIAGNOSIS — Z87891 Personal history of nicotine dependence: Secondary | ICD-10-CM

## 2014-12-02 DIAGNOSIS — F419 Anxiety disorder, unspecified: Secondary | ICD-10-CM | POA: Diagnosis present

## 2014-12-02 DIAGNOSIS — I951 Orthostatic hypotension: Principal | ICD-10-CM | POA: Diagnosis present

## 2014-12-02 DIAGNOSIS — D500 Iron deficiency anemia secondary to blood loss (chronic): Secondary | ICD-10-CM | POA: Diagnosis present

## 2014-12-02 DIAGNOSIS — E11319 Type 2 diabetes mellitus with unspecified diabetic retinopathy without macular edema: Secondary | ICD-10-CM | POA: Diagnosis present

## 2014-12-02 DIAGNOSIS — Z951 Presence of aortocoronary bypass graft: Secondary | ICD-10-CM

## 2014-12-02 DIAGNOSIS — I480 Paroxysmal atrial fibrillation: Secondary | ICD-10-CM | POA: Diagnosis present

## 2014-12-02 DIAGNOSIS — E039 Hypothyroidism, unspecified: Secondary | ICD-10-CM | POA: Diagnosis present

## 2014-12-02 DIAGNOSIS — I739 Peripheral vascular disease, unspecified: Secondary | ICD-10-CM | POA: Diagnosis present

## 2014-12-02 DIAGNOSIS — N184 Chronic kidney disease, stage 4 (severe): Secondary | ICD-10-CM | POA: Diagnosis present

## 2014-12-02 DIAGNOSIS — E876 Hypokalemia: Secondary | ICD-10-CM | POA: Diagnosis present

## 2014-12-02 DIAGNOSIS — E860 Dehydration: Secondary | ICD-10-CM | POA: Diagnosis present

## 2014-12-02 DIAGNOSIS — N179 Acute kidney failure, unspecified: Secondary | ICD-10-CM | POA: Diagnosis present

## 2014-12-02 DIAGNOSIS — R0602 Shortness of breath: Secondary | ICD-10-CM

## 2014-12-02 DIAGNOSIS — I130 Hypertensive heart and chronic kidney disease with heart failure and stage 1 through stage 4 chronic kidney disease, or unspecified chronic kidney disease: Secondary | ICD-10-CM | POA: Diagnosis present

## 2014-12-02 DIAGNOSIS — I2510 Atherosclerotic heart disease of native coronary artery without angina pectoris: Secondary | ICD-10-CM | POA: Diagnosis present

## 2014-12-02 DIAGNOSIS — R42 Dizziness and giddiness: Secondary | ICD-10-CM

## 2014-12-02 DIAGNOSIS — Z95810 Presence of automatic (implantable) cardiac defibrillator: Secondary | ICD-10-CM | POA: Diagnosis not present

## 2014-12-02 DIAGNOSIS — E871 Hypo-osmolality and hyponatremia: Secondary | ICD-10-CM | POA: Diagnosis present

## 2014-12-02 DIAGNOSIS — I255 Ischemic cardiomyopathy: Secondary | ICD-10-CM | POA: Diagnosis present

## 2014-12-02 DIAGNOSIS — E780 Pure hypercholesterolemia: Secondary | ICD-10-CM | POA: Diagnosis present

## 2014-12-02 DIAGNOSIS — I5022 Chronic systolic (congestive) heart failure: Secondary | ICD-10-CM | POA: Diagnosis not present

## 2014-12-02 DIAGNOSIS — R0600 Dyspnea, unspecified: Secondary | ICD-10-CM | POA: Diagnosis not present

## 2014-12-02 DIAGNOSIS — Z888 Allergy status to other drugs, medicaments and biological substances status: Secondary | ICD-10-CM | POA: Diagnosis not present

## 2014-12-02 LAB — BASIC METABOLIC PANEL
Anion gap: 15 (ref 5–15)
BUN: 41 mg/dL — ABNORMAL HIGH (ref 6–20)
CALCIUM: 8.7 mg/dL — AB (ref 8.9–10.3)
CHLORIDE: 79 mmol/L — AB (ref 101–111)
CO2: 34 mmol/L — AB (ref 22–32)
CREATININE: 2.78 mg/dL — AB (ref 0.61–1.24)
GFR calc Af Amer: 24 mL/min — ABNORMAL LOW (ref >60)
GFR calc non Af Amer: 20 mL/min — ABNORMAL LOW (ref >60)
Glucose, Bld: 131 mg/dL — ABNORMAL HIGH (ref 65–99)
Potassium: 2.5 mmol/L — CL (ref 3.5–5.1)
Sodium: 128 mmol/L — ABNORMAL LOW (ref 135–145)

## 2014-12-02 LAB — I-STAT CHEM 8, ED
BUN: 44 mg/dL — AB (ref 6–20)
CHLORIDE: 81 mmol/L — AB (ref 101–111)
Calcium, Ion: 0.92 mmol/L — ABNORMAL LOW (ref 1.13–1.30)
Creatinine, Ser: 2.6 mg/dL — ABNORMAL HIGH (ref 0.61–1.24)
Glucose, Bld: 131 mg/dL — ABNORMAL HIGH (ref 65–99)
HCT: 40 % (ref 39.0–52.0)
Hemoglobin: 13.6 g/dL (ref 13.0–17.0)
POTASSIUM: 2.5 mmol/L — AB (ref 3.5–5.1)
SODIUM: 127 mmol/L — AB (ref 135–145)
TCO2: 33 mmol/L (ref 0–100)

## 2014-12-02 LAB — CBC
HEMATOCRIT: 33.8 % — AB (ref 39.0–52.0)
Hemoglobin: 11.5 g/dL — ABNORMAL LOW (ref 13.0–17.0)
MCH: 28.2 pg (ref 26.0–34.0)
MCHC: 34 g/dL (ref 30.0–36.0)
MCV: 82.8 fL (ref 78.0–100.0)
Platelets: 123 10*3/uL — ABNORMAL LOW (ref 150–400)
RBC: 4.08 MIL/uL — AB (ref 4.22–5.81)
RDW: 16.4 % — ABNORMAL HIGH (ref 11.5–15.5)
WBC: 10.3 10*3/uL (ref 4.0–10.5)

## 2014-12-02 LAB — BRAIN NATRIURETIC PEPTIDE: B Natriuretic Peptide: 262.1 pg/mL — ABNORMAL HIGH (ref 0.0–100.0)

## 2014-12-02 LAB — TROPONIN I: TROPONIN I: 0.11 ng/mL — AB (ref <0.031)

## 2014-12-02 MED ORDER — SODIUM CHLORIDE 0.9 % IV SOLN
INTRAVENOUS | Status: DC
  Administered 2014-12-02: 1000 mL via INTRAVENOUS

## 2014-12-02 MED ORDER — POTASSIUM CHLORIDE CRYS ER 20 MEQ PO TBCR
20.0000 meq | EXTENDED_RELEASE_TABLET | Freq: Once | ORAL | Status: AC
  Administered 2014-12-02: 20 meq via ORAL
  Filled 2014-12-02: qty 1

## 2014-12-02 NOTE — Telephone Encounter (Signed)
Mrs. Aspinall called because Mr. Friedrichs condition has worsened. He has been weak and has fallen multiple times the last few days. He is feeling dizzy. His weight has gone down. She is concerned that he will become significantly injured. She also feels he may he at risk of losing consciousness.  I advised strongly that she should call 911 and bring him to the emergency room.

## 2014-12-02 NOTE — ED Notes (Signed)
Pt fell earlier today from feeling dizzy and feel weak all over. Has been feeling dizzy for a week but it has been worse today. Dr. Missy Sabins is his cardiologist and they called the triage nurse and she told them to come here. Denies having any pain anywhere.

## 2014-12-02 NOTE — ED Notes (Signed)
Transporting patient to new room assignment. 

## 2014-12-02 NOTE — ED Provider Notes (Signed)
CSN: [id number]     Arrival date & time 12/02/14  1727 History   First MD Initiated Contact with Patient 12/02/14 1818     Chief Complaint  Patient presents with  . Fall  . Dizziness     (Consider location/radiation/quality/duration/timing/severity/associated sxs/prior Treatment) HPI Comments: Patient here complaining of dizziness and weakness is worse with standing 1 day. Also has had falls 2 today. His dizziness actually began a week ago and he has fallen 2 a few days ago. Denies any history of head injury. No chest pain or chest pressure. He is chronically dyspneic. Has persistent lower extremity edema. No vomiting or diarrhea. No black or bloody stools. Called his doctor told to come here  Patient is a 79 y.o. male presenting with fall and dizziness. The history is provided by the patient.  Fall  Dizziness   Past Medical History  Diagnosis Date  . VENTRICULAR TACHYCARDIA   . PERIPHERAL VASCULAR DISEASE   . HYPERTENSION     Hypertensive retinopathy-grade II OU  . HYPERCHOLESTEROLEMIA   . CORONARY ARTERY DISEASE     Hx of CABG  . Chronic systolic heart failure     Ischemic cardiomyopathy (01/2014 EF 40-45% with hypokinesis + small area of akinesis  . SEBORRHEIC DERMATITIS   . Chronic renal insufficiency, stage III (moderate)     CrCl 40s-50s  . DIVERTICULOSIS OF COLON   . COPD, severe   . COLONIC POLYPS   . ANXIETY   . ICD (implantable cardiac defibrillator) in place   . Type II diabetes mellitus     +background diabetic retinopathy OU  . Chronic lower back pain   . DJD (degenerative joint disease)     "hands" (05/24/2012)  . Osteoarthritis of finger   . History of atrial flutter   . Thrombus 12/2013    on ICD lead--started anticoag and his cardioversion for atrial flutter was postponed.  . Colon stricture 2015    with features worrisome for mass, also with recent rising CEA level (possible colon cancer)-GI MD= Dr. Jackelyn Hoehn has been declining colonoscopy since that  time.  Dr. Liliane Channel impression on re-eval in 2015 was that the area represented chronic post-diverticulitis changes, not malignancy.  . Macular degeneration, age related, nonexudative     OU  . Chronic blood loss anemia     Transfused 2 U pRBCs April, May, and June (10/26/14) of 2016.  GI w/u has revealed only 2 tiny non-bleeding AVMs in mid small bowel.   Past Surgical History  Procedure Laterality Date  . Coronary artery bypass graft  1990's    CABG X4  . Tonsillectomy  ?1942  . Cardiac defibrillator placement  12/2004    single chamber defibrillator- Medtronic Maxima 8/06 DrKlein [Other][  . Tee without cardioversion N/A 12/22/2013    Procedure: TRANSESOPHAGEAL ECHOCARDIOGRAM (TEE);  Surgeon: Thayer Headings, MD;  Location: [place];  Service: Cardiovascular;  Laterality: N/A;  . Cardioversion N/A 12/22/2013    Procedure: CARDIOVERSION;  Surgeon: Thayer Headings, MD;  Location: [facility] ENDOSCOPY;  Service: Cardiovascular;  Laterality: N/A;  . Cardiovascular stress test  01/12/13    myocard perf imaging: previous infarct with a moderately reduced EF as was known previously.  No new findings.   . Gastric emptying scan  02/02/14    Normal  . Transthoracic echocardiogram  01/25/14; 04/2014    EF 40-45%, diffuse hypokinesis, infero-basilar myocardial akinesis, PA pressure slightly high, valves fine.04/2014-Echo today EF ~40% inferior AK moderate to severe MR  . Abdominal  ultrasound  01/2014    GB sludge, o/w normal  . Ct virtual colonoscopy diagnostic  11/2012; repeat 09/2014    Asymmetric thickening of the rectosigmoid colon worrisome for colon cancer, but further eval by pt's GI MD led to conclusion that this was likely sequela of recurrent diverticulitis.  Pt has refused colonoscopy to evaluate this region.  . Cardiovascular stress test      Lexiscan: There is a medium sized fixed inferior wall defect of moderate severity involving the apical inferior and mid inferior and basal inferoseptal segments,  consistent with old inferior MI. No significant reversible ischemia.  EF 34%, inferior wall hypokinesis--intermediate risk scan (ok to do planned procedure)  . Implantable cardioverter defibrillator revision N/A 05/25/2012    Procedure: IMPLANTABLE CARDIOVERTER DEFIBRILLATOR REVISION;  Surgeon: Evans Lance, MD;  Location: [facility] CATH LAB;  Service: Cardiovascular;  Laterality: N/A;  . Right heart catheterization N/A 08/31/2014    Procedure: RIGHT HEART CATH;  Surgeon: Larey Dresser, MD;  Location: [facility] CATH LAB;  Service: Cardiovascular;  Laterality: N/A;  . Esophagogastroduodenoscopy N/A 09/03/2014    Candida esophagitis + retained gastric contents.  No bleeding. Procedure: ESOPHAGOGASTRODUODENOSCOPY (EGD);  Surgeon: Carol Ada, MD;  Location: [facility] ENDOSCOPY;  Service: Endoscopy;  Laterality: N/A;  . Givens capsule study N/A 10/12/2014    Procedure: GIVENS CAPSULE STUDY;  Surgeon: Irene Shipper, MD;  Location: [place];  Service: Endoscopy;  Laterality: N/A;   Family History  Problem Relation Age of Onset  . Heart attack Father 90  . Heart attack Brother     Vague history   History  Substance Use Topics  . Smoking status: Former Smoker -- 2.00 packs/day for 50 years    Types: Cigarettes    Quit date: 06/24/2000  . Smokeless tobacco: Never Used  . Alcohol Use: 0.0 oz/week    0 Standard drinks or equivalent per week     Comment: 05/24/2012 "used to drink alot; quit > 10 yr ago"    Review of Systems  Neurological: Positive for dizziness.  All other systems reviewed and are negative.     Allergies  Niacin  Home Medications   Prior to Admission medications   Medication Sig Start Date End Date Taking? Authorizing Provider  amiodarone (PACERONE) 200 MG tablet Take 1 tablet (200 mg total) by mouth daily. 10/18/14  Yes Ivan Anchors Love, PA-C  Fluticasone-Salmeterol (ADVAIR) 500-50 MCG/DOSE AEPB Inhale 1 puff into the lungs 2 (two) times daily. 04/05/14  Yes Noralee Space, MD  folic acid  (FOLVITE) 1 MG tablet Take 1 tablet (1 mg total) by mouth daily. 11/22/14  Yes Tammi Sou, MD  guaiFENesin (MUCINEX) 600 MG 12 hr tablet Take 1 tablet (600 mg total) by mouth 2 (two) times daily. 11/17/14  Yes Irene Pap, NP  isosorbide mononitrate (IMDUR) 30 MG 24 hr tablet Take 30 mg by mouth daily. 11/04/14  Yes Historical Provider, MD  levothyroxine (SYNTHROID, LEVOTHROID) 88 MCG tablet Take 1 tablet (88 mcg total) by mouth daily. 10/25/14  Yes Tammi Sou, MD  metolazone (ZAROXOLYN) 2.5 MG tablet 1 tab po every Monday and every Friday Patient taking differently: Take 2.5 mg by mouth 2 (two) times a week. 1 tab po every Monday and every Friday 11/08/14  Yes Tammi Sou, MD  pantoprazole (PROTONIX) 40 MG tablet Take 1 tablet (40 mg total) by mouth daily. 10/18/14  Yes Ivan Anchors Love, PA-C  potassium chloride SA (KLOR-CON M20) 20 MEQ tablet Take  1 tablet (20 mEq total) by mouth 2 (two) times daily. 10/18/14  Yes Ivan Anchors Love, PA-C  pravastatin (PRAVACHOL) 80 MG tablet Take 1 tablet (80 mg total) by mouth at bedtime. 10/18/14  Yes Ivan Anchors Love, PA-C  tiotropium (SPIRIVA HANDIHALER) 18 MCG inhalation capsule PLACE ONE CAPSULE INTO INHALER AND  INHALE  DAILY Patient taking differently: Place 18 mcg into inhaler and inhale at bedtime.  10/18/14  Yes Ivan Anchors Love, PA-C  torsemide (DEMADEX) 20 MG tablet 1 tab po bid Patient taking differently: Take 20 mg by mouth 2 (two) times daily. 1 tab po bid 11/08/14  Yes Tammi Sou, MD  doxycycline (VIBRA-TABS) 100 MG tablet Take 1 tablet (100 mg total) by mouth 2 (two) times daily. Patient not taking: Reported on 12/02/2014 11/17/14   Irene Pap, NP  Insulin Glargine (LANTUS SOLOSTAR) 100 UNIT/ML Solostar Pen 10 units SQ qhs Patient taking differently: Inject 10 Units into the skin at bedtime.  02/10/14   Tammi Sou, MD  insulin lispro (HUMALOG KWIKPEN) 100 UNIT/ML KiwkPen Take 5 units in the morning, 5 units at noon, --if blood sugar over  100 and you eat > 50% of meals Patient taking differently: Inject 7-9 Units into the skin 3 (three) times daily. Take 7 units in the morning, 8 units at noon, and 9 units at dinner 10/18/14   Ivan Anchors Love, PA-C  senna-docusate (SENOKOT-S) 8.6-50 MG per tablet Take 1 tablet by mouth at bedtime as needed for mild constipation. 10/18/14   Bary Leriche, PA-C  thiamine (VITAMIN B-1) 100 MG tablet Take 1 tablet (100 mg total) by mouth daily. 11/29/14   Larey Dresser, MD   BP 99/47 mmHg  Pulse 65  Temp(Src) 97.3 F (36.3 C) (Oral)  Resp 18  Ht 5\' 7"  (1.702 m)  Wt 136 lb 11.2 oz (62.007 kg)  BMI 21.41 kg/m2  SpO2 98% Physical Exam  Constitutional: He is oriented to person, place, and time. He appears well-developed and well-nourished.  Non-toxic appearance. No distress.  HENT:  Head: Normocephalic and atraumatic.  Eyes: Conjunctivae, EOM and lids are normal. Pupils are equal, round, and reactive to light.  Neck: Normal range of motion. Neck supple. No tracheal deviation present. No thyroid mass present.  Cardiovascular: Normal rate, regular rhythm and normal heart sounds.  Exam reveals no gallop.   No murmur heard. Pulmonary/Chest: Effort normal. No stridor. No respiratory distress. He has decreased breath sounds. He has no wheezes. He has rhonchi. He has no rales.  Abdominal: Soft. Normal appearance and bowel sounds are normal. He exhibits no distension. There is no tenderness. There is no rebound and no CVA tenderness.  Musculoskeletal: Normal range of motion. He exhibits no edema or tenderness.  3+ bilateral lower extremity pitting edema  Neurological: He is alert and oriented to person, place, and time. He has normal strength. No cranial nerve deficit or sensory deficit. GCS eye subscore is 4. GCS verbal subscore is 5. GCS motor subscore is 6.  Skin: Skin is warm and dry. No abrasion and no rash noted.  Psychiatric: He has a normal mood and affect. His speech is normal and behavior is normal.   Nursing note and vitals reviewed.   ED Course  Procedures (including critical care time) Labs Review Labs Reviewed  CBC - Abnormal; Notable for the following:    RBC 4.08 (*)    Hemoglobin 11.5 (*)    HCT 33.8 (*)    RDW 16.4 (*)  Platelets 123 (*)    All other components within normal limits  BASIC METABOLIC PANEL - Abnormal; Notable for the following:    Sodium 128 (*)    Potassium 2.5 (*)    Chloride 79 (*)    CO2 34 (*)    Glucose, Bld 131 (*)    BUN 41 (*)    Creatinine, Ser 2.78 (*)    Calcium 8.7 (*)    GFR calc non Af Amer 20 (*)    GFR calc Af Amer 24 (*)    All other components within normal limits  I-STAT CHEM 8, ED - Abnormal; Notable for the following:    Sodium 127 (*)    Potassium 2.5 (*)    Chloride 81 (*)    BUN 44 (*)    Creatinine, Ser 2.60 (*)    Glucose, Bld 131 (*)    Calcium, Ion 0.92 (*)    All other components within normal limits  BRAIN NATRIURETIC PEPTIDE  TROPONIN I    Imaging Review No results found.  ED ECG REPORT   Date: 12/02/2014  Rate: 68  Rhythm: normal sinus rhythm  QRS Axis: right  Intervals: normal  ST/T Wave abnormalities: nonspecific ST changes  Conduction Disutrbances:left bundle branch block  Narrative Interpretation:   Old EKG Reviewed: unchanged  I have personally reviewed the EKG tracing and agree with the computerized printout as noted.   MDM   Final diagnoses:  SOB (shortness of breath)   patient's hypokalemia treated with oral potassium. Will be admitted to the hospital for further management of his worsening renal function.    Lacretia Leigh, MD 12/02/14 2214

## 2014-12-03 ENCOUNTER — Encounter (HOSPITAL_COMMUNITY): Payer: Self-pay | Admitting: Internal Medicine

## 2014-12-03 DIAGNOSIS — R42 Dizziness and giddiness: Secondary | ICD-10-CM

## 2014-12-03 DIAGNOSIS — N179 Acute kidney failure, unspecified: Secondary | ICD-10-CM | POA: Diagnosis present

## 2014-12-03 DIAGNOSIS — N189 Chronic kidney disease, unspecified: Secondary | ICD-10-CM

## 2014-12-03 DIAGNOSIS — E876 Hypokalemia: Secondary | ICD-10-CM

## 2014-12-03 LAB — COMPREHENSIVE METABOLIC PANEL
ALBUMIN: 2.2 g/dL — AB (ref 3.5–5.0)
ALBUMIN: 2.2 g/dL — AB (ref 3.5–5.0)
ALK PHOS: 108 U/L (ref 38–126)
ALK PHOS: 115 U/L (ref 38–126)
ALT: 25 U/L (ref 17–63)
ALT: 25 U/L (ref 17–63)
AST: 74 U/L — AB (ref 15–41)
AST: 75 U/L — AB (ref 15–41)
Anion gap: 14 (ref 5–15)
Anion gap: 11 (ref 5–15)
BILIRUBIN TOTAL: 1.5 mg/dL — AB (ref 0.3–1.2)
BILIRUBIN TOTAL: 1.5 mg/dL — AB (ref 0.3–1.2)
BUN: 40 mg/dL — AB (ref 6–20)
BUN: 36 mg/dL — ABNORMAL HIGH (ref 6–20)
CHLORIDE: 83 mmol/L — AB (ref 101–111)
CO2: 32 mmol/L (ref 22–32)
CO2: 34 mmol/L — AB (ref 22–32)
Calcium: 8.5 mg/dL — ABNORMAL LOW (ref 8.9–10.3)
Calcium: 8.4 mg/dL — ABNORMAL LOW (ref 8.9–10.3)
Chloride: 81 mmol/L — ABNORMAL LOW (ref 101–111)
Creatinine, Ser: 2.48 mg/dL — ABNORMAL HIGH (ref 0.61–1.24)
Creatinine, Ser: 2.54 mg/dL — ABNORMAL HIGH (ref 0.61–1.24)
GFR calc Af Amer: 27 mL/min — ABNORMAL LOW (ref >60)
GFR calc Af Amer: 26 mL/min — ABNORMAL LOW (ref >60)
GFR calc non Af Amer: 23 mL/min — ABNORMAL LOW (ref >60)
GFR calc non Af Amer: 23 mL/min — ABNORMAL LOW (ref >60)
Glucose, Bld: 102 mg/dL — ABNORMAL HIGH (ref 65–99)
Glucose, Bld: 135 mg/dL — ABNORMAL HIGH (ref 65–99)
POTASSIUM: 2.4 mmol/L — AB (ref 3.5–5.1)
Potassium: 2.8 mmol/L — ABNORMAL LOW (ref 3.5–5.1)
SODIUM: 129 mmol/L — AB (ref 135–145)
SODIUM: 126 mmol/L — AB (ref 135–145)
Total Protein: 5.8 g/dL — ABNORMAL LOW (ref 6.5–8.1)
Total Protein: 5.9 g/dL — ABNORMAL LOW (ref 6.5–8.1)

## 2014-12-03 LAB — MRSA PCR SCREENING: MRSA BY PCR: NEGATIVE

## 2014-12-03 LAB — CBC WITH DIFFERENTIAL/PLATELET
BASOS ABS: 0 10*3/uL (ref 0.0–0.1)
Basophils Relative: 1 % (ref 0–1)
EOS PCT: 2 % (ref 0–5)
Eosinophils Absolute: 0.1 10*3/uL (ref 0.0–0.7)
HCT: 29.5 % — ABNORMAL LOW (ref 39.0–52.0)
Hemoglobin: 10.3 g/dL — ABNORMAL LOW (ref 13.0–17.0)
Lymphocytes Relative: 20 % (ref 12–46)
Lymphs Abs: 1.7 10*3/uL (ref 0.7–4.0)
MCH: 28.9 pg (ref 26.0–34.0)
MCHC: 34.9 g/dL (ref 30.0–36.0)
MCV: 82.6 fL (ref 78.0–100.0)
Monocytes Absolute: 1 10*3/uL (ref 0.1–1.0)
Monocytes Relative: 11 % (ref 3–12)
Neutro Abs: 5.8 10*3/uL (ref 1.7–7.7)
Neutrophils Relative %: 67 % (ref 43–77)
PLATELETS: 107 10*3/uL — AB (ref 150–400)
RBC: 3.57 MIL/uL — AB (ref 4.22–5.81)
RDW: 16.4 % — AB (ref 11.5–15.5)
WBC: 8.6 10*3/uL (ref 4.0–10.5)

## 2014-12-03 LAB — GLUCOSE, CAPILLARY
GLUCOSE-CAPILLARY: 118 mg/dL — AB (ref 65–99)
GLUCOSE-CAPILLARY: 137 mg/dL — AB (ref 65–99)
Glucose-Capillary: 124 mg/dL — ABNORMAL HIGH (ref 65–99)
Glucose-Capillary: 116 mg/dL — ABNORMAL HIGH (ref 65–99)
Glucose-Capillary: 135 mg/dL — ABNORMAL HIGH (ref 65–99)

## 2014-12-03 LAB — TROPONIN I
Troponin I: 0.1 ng/mL — ABNORMAL HIGH (ref <0.031)
Troponin I: 0.1 ng/mL — ABNORMAL HIGH (ref <0.031)
Troponin I: 0.09 ng/mL — ABNORMAL HIGH (ref <0.031)

## 2014-12-03 LAB — MAGNESIUM
MAGNESIUM: 1.2 mg/dL — AB (ref 1.7–2.4)
Magnesium: 1.7 mg/dL (ref 1.7–2.4)

## 2014-12-03 MED ORDER — MOMETASONE FURO-FORMOTEROL FUM 200-5 MCG/ACT IN AERO
2.0000 | INHALATION_SPRAY | Freq: Two times a day (BID) | RESPIRATORY_TRACT | Status: DC
  Administered 2014-12-03 – 2014-12-05 (×5): 2 via RESPIRATORY_TRACT
  Filled 2014-12-03: qty 8.8

## 2014-12-03 MED ORDER — SODIUM CHLORIDE 0.9 % IV BOLUS (SEPSIS)
250.0000 mL | Freq: Once | INTRAVENOUS | Status: AC
  Administered 2014-12-03: 250 mL via INTRAVENOUS

## 2014-12-03 MED ORDER — SODIUM CHLORIDE 0.9 % IV BOLUS (SEPSIS)
250.0000 mL | Freq: Once | INTRAVENOUS | Status: AC
Start: 2014-12-03 — End: 2014-12-03
  Administered 2014-12-03: 250 mL via INTRAVENOUS

## 2014-12-03 MED ORDER — ONDANSETRON HCL 4 MG PO TABS: 4.0000 mg | ORAL_TABLET | Freq: Four times a day (QID) | ORAL | Status: DC | PRN

## 2014-12-03 MED ORDER — ACETAMINOPHEN 325 MG PO TABS: 650.0000 mg | ORAL_TABLET | Freq: Four times a day (QID) | ORAL | Status: DC | PRN

## 2014-12-03 MED ORDER — ACETAMINOPHEN 650 MG RE SUPP: 650.0000 mg | Freq: Four times a day (QID) | RECTAL | Status: DC | PRN

## 2014-12-03 MED ORDER — MAGNESIUM OXIDE 400 (241.3 MG) MG PO TABS
400.0000 mg | ORAL_TABLET | Freq: Two times a day (BID) | ORAL | Status: DC
  Administered 2014-12-03 – 2014-12-04 (×3): 400 mg via ORAL
  Filled 2014-12-03 (×4): qty 1

## 2014-12-03 MED ORDER — POTASSIUM CHLORIDE CRYS ER 20 MEQ PO TBCR
60.0000 meq | EXTENDED_RELEASE_TABLET | Freq: Four times a day (QID) | ORAL | Status: DC
  Administered 2014-12-03 (×2): 60 meq via ORAL

## 2014-12-03 MED ORDER — LEVOTHYROXINE SODIUM 88 MCG PO TABS
88.0000 ug | ORAL_TABLET | Freq: Every day | ORAL | Status: DC
  Administered 2014-12-03 – 2014-12-05 (×3): 88 ug via ORAL
  Filled 2014-12-03 (×4): qty 1

## 2014-12-03 MED ORDER — SODIUM CHLORIDE 0.9 % IJ SOLN
3.0000 mL | Freq: Two times a day (BID) | INTRAMUSCULAR | Status: DC
  Administered 2014-12-03 – 2014-12-04 (×5): 3 mL via INTRAVENOUS

## 2014-12-03 MED ORDER — AMIODARONE HCL 200 MG PO TABS
200.0000 mg | ORAL_TABLET | Freq: Every day | ORAL | Status: DC
  Administered 2014-12-03 – 2014-12-05 (×3): 200 mg via ORAL
  Filled 2014-12-03 (×3): qty 1

## 2014-12-03 MED ORDER — POTASSIUM CHLORIDE CRYS ER 20 MEQ PO TBCR
60.0000 meq | EXTENDED_RELEASE_TABLET | Freq: Four times a day (QID) | ORAL | Status: AC
  Administered 2014-12-03 – 2014-12-04 (×2): 60 meq via ORAL
  Filled 2014-12-03 (×2): qty 3

## 2014-12-03 MED ORDER — MAGNESIUM SULFATE 2 GM/50ML IV SOLN
2.0000 g | Freq: Once | INTRAVENOUS | Status: AC
  Administered 2014-12-03: 2 g via INTRAVENOUS
  Filled 2014-12-03: qty 50

## 2014-12-03 MED ORDER — POTASSIUM CHLORIDE CRYS ER 20 MEQ PO TBCR
20.0000 meq | EXTENDED_RELEASE_TABLET | Freq: Once | ORAL | Status: AC
  Administered 2014-12-03: 20 meq via ORAL
  Filled 2014-12-03: qty 1

## 2014-12-03 MED ORDER — ALBUMIN HUMAN 25 % IV SOLN
12.5000 g | Freq: Once | INTRAVENOUS | Status: AC
  Administered 2014-12-03: 12.5 g via INTRAVENOUS
  Filled 2014-12-03: qty 50

## 2014-12-03 MED ORDER — POTASSIUM CHLORIDE CRYS ER 20 MEQ PO TBCR: 40.0000 meq | EXTENDED_RELEASE_TABLET | Freq: Three times a day (TID) | ORAL | Status: DC

## 2014-12-03 MED ORDER — PRAVASTATIN SODIUM 80 MG PO TABS
80.0000 mg | ORAL_TABLET | Freq: Every day | ORAL | Status: DC
  Administered 2014-12-03 – 2014-12-04 (×2): 80 mg via ORAL
  Filled 2014-12-03 (×3): qty 1

## 2014-12-03 MED ORDER — INSULIN ASPART 100 UNIT/ML ~~LOC~~ SOLN
0.0000 [IU] | Freq: Three times a day (TID) | SUBCUTANEOUS | Status: DC
  Administered 2014-12-03 (×2): 1 [IU] via SUBCUTANEOUS
  Administered 2014-12-04: 2 [IU] via SUBCUTANEOUS
  Administered 2014-12-04: 1 [IU] via SUBCUTANEOUS
  Administered 2014-12-05: 3 [IU] via SUBCUTANEOUS

## 2014-12-03 MED ORDER — TIOTROPIUM BROMIDE MONOHYDRATE 18 MCG IN CAPS
18.0000 ug | ORAL_CAPSULE | Freq: Every day | RESPIRATORY_TRACT | Status: DC
  Administered 2014-12-03 – 2014-12-04 (×2): 18 ug via RESPIRATORY_TRACT
  Filled 2014-12-03: qty 5

## 2014-12-03 MED ORDER — ONDANSETRON HCL 4 MG/2ML IJ SOLN: 4.0000 mg | Freq: Four times a day (QID) | INTRAMUSCULAR | Status: DC | PRN

## 2014-12-03 MED ORDER — PANTOPRAZOLE SODIUM 40 MG PO TBEC
40.0000 mg | DELAYED_RELEASE_TABLET | Freq: Every day | ORAL | Status: DC
  Administered 2014-12-03 – 2014-12-05 (×3): 40 mg via ORAL
  Filled 2014-12-03 (×3): qty 1

## 2014-12-03 MED ORDER — FOLIC ACID 1 MG PO TABS
1.0000 mg | ORAL_TABLET | Freq: Every day | ORAL | Status: DC
Start: 2014-12-03 — End: 2014-12-05
  Administered 2014-12-03 – 2014-12-05 (×3): 1 mg via ORAL
  Filled 2014-12-03 (×3): qty 1

## 2014-12-03 MED ORDER — GUAIFENESIN ER 600 MG PO TB12
600.0000 mg | ORAL_TABLET | Freq: Two times a day (BID) | ORAL | Status: DC
  Administered 2014-12-03 – 2014-12-05 (×5): 600 mg via ORAL
  Filled 2014-12-03 (×7): qty 1

## 2014-12-03 MED ORDER — ISOSORBIDE MONONITRATE ER 30 MG PO TB24
30.0000 mg | ORAL_TABLET | Freq: Every day | ORAL | Status: DC
Start: 2014-12-03 — End: 2014-12-03
  Administered 2014-12-03: 30 mg via ORAL
  Filled 2014-12-03: qty 1

## 2014-12-03 NOTE — Progress Notes (Signed)
Pt had Mg of 1.2 and troponin of 0.10 was 0.11, MD notified, ordered Mg IV, will continue to monitor, Thanks Arvella Nigh RN

## 2014-12-03 NOTE — Progress Notes (Addendum)
Patient seen and examined   Recheck electrolytes this afternoon, after repletion  Requested cardiology to adjust meds, discuss prognosis and interrogate ICD   Albumin infusion due to recent paracentesis and orthostasis   Continue to hold diuretics

## 2014-12-03 NOTE — Progress Notes (Signed)
ICD is set VVI at 23. Pacing less than 0.1% of the time.  However, HR < 30 recorded with not visible pacing spikes.  Strip is in Saved Strips, not able to pull it up on telemetry review.   RV threshold has been trending up for over a year, now at 1.75 V @ 0.4 ms Base pacing is VVI 40.  Unclear why it did not pace at that time.  HR currently higher, data available for review. Continue current therapy.  Rosaria Ferries, Hershal Coria 12/03/2014 3:03 PM Beeper [phone]

## 2014-12-03 NOTE — Evaluation (Addendum)
Physical Therapy Evaluation Patient Details Name: [name] MRN: [id number] DOB: 1935-11-26 Today's Date: 12/03/2014   History of Present Illness  Patient is a 79 yo male admitted 12/02/14 with dizziness and 2 falls.  Patient with orthostatic hypotension in ED.  PMH:  HTN, CAD, CABG, ICM, ICD, CHF, DM, arythmias, Mod/severe mitral regurgitation.  Clinical Impression  Patient presents with problems listed below.  Will benefit from acute PT to maximize functional mobility prior to discharge home with wife.  Patient with decreased strength and balance, impacting functional mobility.  Recommend f/u HHPT at discharge for continued therapy.    Follow Up Recommendations Home health PT;Supervision/Assistance - 24 hour    Equipment Recommendations  None recommended by PT    Recommendations for Other Services       Precautions / Restrictions Precautions Precautions: Fall Precaution Comments: Recent falls prior to admission Restrictions Weight Bearing Restrictions: No      Mobility  Bed Mobility Overal bed mobility: Needs Assistance Bed Mobility: Supine to Sit;Sit to Supine     Supine to sit: Supervision Sit to supine: Supervision   General bed mobility comments: Supervision for safety.  Patient reports no dizziness with moving to sitting today.  Did have orthostatic BP change - systolic from 500 to 87.  Transfers Overall transfer level: Needs assistance Equipment used: 1 person hand held assist Transfers: Sit to/from Stand Sit to Stand: Min guard         General transfer comment: Min guard assist for safety/balance during transition.  Patient reports no/min dizziness with standing. Describes dizziness as lightheadedness.  No drop in BP with standing.  Patient required UE support to maintain standing balance.  Ambulation/Gait Ambulation/Gait assistance: Min assist Ambulation Distance (Feet): 24 Feet Assistive device: 1 person hand held assist Gait Pattern/deviations:  Step-through pattern;Decreased step length - right;Decreased step length - left;Decreased stride length;Steppage;Ataxic (Steppage Rt > Lt) Gait velocity: Decreased Gait velocity interpretation: Below normal speed for age/gender General Gait Details: Patient with very unsteady gait, with ataxic movements of LE's.  Required UE support to maintain balance.  Tolerated only short distances.  Noted shaking/tremors in UE's and trunk in stance/gait.  Stairs            Wheelchair Mobility    Modified Rankin (Stroke Patients Only)       Balance Overall balance assessment: Needs assistance Sitting-balance support: No upper extremity supported;Feet supported Sitting balance-Leahy Scale: Good     Standing balance support: Single extremity supported;During functional activity Standing balance-Leahy Scale: Poor Standing balance comment: Requires UE support to maintain balance.                             Pertinent Vitals/Pain Pain Assessment: No/denies pain  BP supine: 108/41 BP sitting:  87/48 BP standing (2 min):  92/60    Home Living Family/patient expects to be discharged to:: Private residence Living Arrangements: Spouse/significant other Available Help at Discharge: Family;Available 24 hours/day Type of Home: House Home Access: Stairs to enter Entrance Stairs-Rails: Psychiatric nurse of Steps: 3 Home Layout: One level Home Equipment: Walker - 2 wheels;Walker - 4 wheels;Shower seat;Grab bars - tub/shower      Prior Function Level of Independence: Independent with assistive device(s);Needs assistance   Gait / Transfers Assistance Needed: Uses RW for ambulation.  Has had falls at home  ADL's / Homemaking Assistance Needed: Wife assists with lower body dressing, meal prep, housekeeping  Comments: Has stopped driving.  Hand Dominance   Dominant Hand: Right    Extremity/Trunk Assessment   Upper Extremity Assessment: Generalized weakness            Lower Extremity Assessment: Generalized weakness (Ataxic movement)         Communication   Communication: No difficulties  Cognition Arousal/Alertness: Awake/alert Behavior During Therapy: WFL for tasks assessed/performed Overall Cognitive Status: Within Functional Limits for tasks assessed                      General Comments      Exercises        Assessment/Plan    PT Assessment Patient needs continued PT services  PT Diagnosis Difficulty walking;Abnormality of gait;Generalized weakness   PT Problem List Decreased strength;Decreased activity tolerance;Decreased balance;Decreased mobility;Decreased coordination  PT Treatment Interventions DME instruction;Gait training;Stair training;Functional mobility training;Therapeutic activities;Balance training;Patient/family education   PT Goals (Current goals can be found in the Care Plan section) Acute Rehab PT Goals Patient Stated Goal: To feel better. PT Goal Formulation: With patient Time For Goal Achievement: 12/10/14 Potential to Achieve Goals: Good    Frequency Min 3X/week   Barriers to discharge        Co-evaluation               End of Session Equipment Utilized During Treatment: Gait belt Activity Tolerance: Patient limited by fatigue;Treatment limited secondary to medical complications (Comment) (Limited by hypotension) Patient left: in bed;with call bell/phone within reach Nurse Communication: Mobility status         Time: 8338-2505 PT Time Calculation (min) (ACUTE ONLY): 21 min   Charges:   PT Evaluation $Initial PT Evaluation Tier I: 1 Procedure     PT G CodesDespina Pole 12-11-14, 7:33 PM Carita Pian. Sanjuana Kava, [place] Pager [phone]

## 2014-12-03 NOTE — Consult Note (Signed)
CARDIOLOGY CONSULT NOTE   Patient ID: VUE PAVON MRN: [id number] DOB/AGE: 1935-06-04 79 y.o.  Admit date: 12/02/2014  Primary Physician   Tammi Sou, MD Primary Cardiologist   Dr. Haroldine Laws Reason for Consultation   CHF  HEN:IDPOEU [name] is a 79 y.o. year old male with a history of HTN, CAD s/p CABG, ICM s/p Medtronic ICD, chronic systolic heart failure, DM2, Vtach, atrial flutter, PVD and GI mass. ECHO (12/22/13) EF 20-25%, EF 40%  by echo 12/15 with Mod-severe MR.   01/2014 admitted with volume overload and low output CHF. Discharge weight 151 pounds, creatinine 1.42.   April 2016, admitted twice with CHF and cardiorenal syndrome requiring milrinone. He also had an GI bleed at that time and had candidal esophagitis with 2 colonic AVMs. Eliquis was stopped due to recurrent bleeding. Later in April, he had pneumonia. Discharge weight 147.  He has been hyponatremic in the past and started on sodium chloride tablets.    11/20/2014, he was seen in the office by Dr. Haroldine Laws on . His weight at that time was 146 pounds. His volume status was stable. It was felt that he would benefit from a cardiomems device but this has not happened yet. His MR is to be followed closely and if his LV begins dilating, TEE will then be performed. His operative all was checked and was okay and his ICD was interrogated and it was also okay. His sodium at that time was 123 with a chloride of 84, potassium of 3.5, BUN 35 and creatinine 2.17.   11/28/2014, he had paracentesis with 600 mL removed.  11/29/2014, his weight was down to 136 pounds, he had multiple skin tears on his arms and 2-3 plus lower extremity edema. He was orthostatic with a systolic blood pressure dropping from 110 sitting to 70 with standing. He was advised to stop Imdur and hydralazine.  12/02/2014, his wife called stating that he was falling and dizzy and he was advised to bring him to the hospital. Later that evening  she did so. Admission labs showed a sodium of 128, potassium 2.5, BUN 41, creatinine 2.78 and chloride 79. Today's labs are below. He was significantly orthostatic on admission with S/P be decreasing from 125 down to 65 going from lying to standing. His current weight is 132 pounds.  His wife states that Mr. Montrose has been eating very poorly. He has had no chest pain and feels that his shortness of breath is at baseline. He has no appetite. He has been urinating very frequently and she states he is putting out large amounts. He denies any cramping pains, tremors or any kind of jerking. He has had no palpitations. Dr. Donney Rankins is ordering albumen in the hopes of correcting his albumen and hopefully this will improve his volume status. He is to get potassium totaling 200 mEq today. He has already had 80 mEq. He also got 2 g of IV magnesium. He feels weak but otherwise feels okay.   Past Medical History  Diagnosis Date  . VENTRICULAR TACHYCARDIA   . PERIPHERAL VASCULAR DISEASE   . HYPERTENSION     Hypertensive retinopathy-grade II OU  . HYPERCHOLESTEROLEMIA   . CORONARY ARTERY DISEASE     Hx of CABG  . Chronic systolic heart failure     Ischemic cardiomyopathy (01/2014 EF 40-45% with hypokinesis + small area of akinesis; 08/2014, Echo with EF 40-45 percent, PAS 78  . SEBORRHEIC DERMATITIS   . Chronic renal  insufficiency, stage III (moderate)     CrCl 40s-50s  . DIVERTICULOSIS OF COLON   . COPD, severe   . COLONIC POLYPS   . ANXIETY   . ICD (implantable cardiac defibrillator) in place   . Type II diabetes mellitus     +background diabetic retinopathy OU  . Chronic lower back pain   . DJD (degenerative joint disease)     "hands" (05/24/2012)  . Osteoarthritis of finger   . History of atrial flutter   . Thrombus 12/2013    on ICD lead--started anticoag and his cardioversion for atrial flutter was postponed.  . Colon stricture 2015    with features worrisome for mass, also with recent  rising CEA level (possible colon cancer)-GI MD= Dr. Jackelyn Hoehn has been declining colonoscopy since that time.  Dr. Liliane Channel impression on re-eval in 2015 was that the area represented chronic post-diverticulitis changes, not malignancy.  . Macular degeneration, age related, nonexudative     OU  . Chronic blood loss anemia     Transfused 2 U pRBCs April, May, and June (10/26/14) of 2016.  GI w/u has revealed only 2 tiny non-bleeding AVMs in mid small bowel.     Past Surgical History  Procedure Laterality Date  . Coronary artery bypass graft  1990's    CABG X4  . Tonsillectomy  ?1942  . Cardiac defibrillator placement  12/2004    single chamber defibrillator- Medtronic Maxima Dr Caryl Comes   . Tee without cardioversion N/A 12/22/2013    Procedure: TRANSESOPHAGEAL ECHOCARDIOGRAM (TEE);  Surgeon: Thayer Headings, MD;  Location: [facility];  Service: Cardiovascular;  Laterality: N/A;  . Cardioversion N/A 12/22/2013    Procedure: CARDIOVERSION;  Surgeon: Thayer Headings, MD;  Location: [facility] ENDOSCOPY;  Service: Cardiovascular;  Laterality: N/A;  . Cardiovascular stress test  01/12/13    myocard perf imaging: previous infarct with a moderately reduced EF as was known previously.  No new findings.   . Gastric emptying scan  02/02/14    Normal  . Transthoracic echocardiogram  01/25/14; 04/2014    EF 40-45%, diffuse hypokinesis, infero-basilar myocardial akinesis, PA pressure slightly high, valves fine.04/2014-Echo today EF ~40% inferior AK moderate to severe MR  . Abdominal ultrasound  01/2014    GB sludge, o/w normal  . Ct virtual colonoscopy diagnostic  11/2012; repeat 09/2014    Asymmetric thickening of the rectosigmoid colon worrisome for colon cancer, but further eval by pt's GI MD led to conclusion that this was likely sequela of recurrent diverticulitis.  Pt has refused colonoscopy to evaluate this region.  . Cardiovascular stress test      Lexiscan: There is a medium sized fixed inferior wall defect of  moderate severity involving the apical inferior and mid inferior and basal inferoseptal segments, consistent with old inferior MI. No significant reversible ischemia.  EF 34%, inferior wall hypokinesis--intermediate risk scan (ok to do planned procedure)  . Implantable cardioverter defibrillator revision N/A 05/25/2012    Procedure: IMPLANTABLE CARDIOVERTER DEFIBRILLATOR REVISION;  Surgeon: Evans Lance, MD;  Location: [facility] CATH LAB;  Service: Cardiovascular;  Laterality: N/A;  . Right heart catheterization N/A 08/31/2014    Procedure: RIGHT HEART CATH;  Surgeon: Larey Dresser, MD;  Location: [facility] CATH LAB;  Service: Cardiovascular;  Laterality: N/A;  . Esophagogastroduodenoscopy N/A 09/03/2014    Candida esophagitis + retained gastric contents.  No bleeding. Procedure: ESOPHAGOGASTRODUODENOSCOPY (EGD);  Surgeon: Carol Ada, MD;  Location: [facility] ENDOSCOPY;  Service: Endoscopy;  Laterality: N/A;  . Givens capsule  study N/A 10/12/2014    Procedure: GIVENS CAPSULE STUDY;  Surgeon: Irene Shipper, MD;  Location: [facility] ENDOSCOPY;  Service: Endoscopy;  Laterality: N/A;    Allergies  Allergen Reactions  . Niacin Itching    Niaspan     I have reviewed the patient's current medications . albumin human  12.5 g Intravenous Once  . amiodarone  200 mg Oral Daily  . folic acid  1 mg Oral Daily  . guaiFENesin  600 mg Oral BID  . insulin aspart  0-9 Units Subcutaneous TID WC  . isosorbide mononitrate  30 mg Oral Daily  . levothyroxine  88 mcg Oral QAC breakfast  . magnesium oxide  400 mg Oral BID  . mometasone-formoterol  2 puff Inhalation BID  . pantoprazole  40 mg Oral Daily  . potassium chloride  60 mEq Oral Q6H  . pravastatin  80 mg Oral QHS  . sodium chloride  3 mL Intravenous Q12H  . tiotropium  18 mcg Inhalation QHS     acetaminophen **OR** acetaminophen, ondansetron **OR** ondansetron (ZOFRAN) IV  Medication Sig  amiodarone (PACERONE) 200 MG tablet Take 1 tablet (200 mg total) by mouth daily.    Fluticasone-Salmeterol (ADVAIR) 500-50 MCG/DOSE AEPB Inhale 1 puff into the lungs 2 (two) times daily.  folic acid (FOLVITE) 1 MG tablet Take 1 tablet (1 mg total) by mouth daily.  guaiFENesin (MUCINEX) 600 MG 12 hr tablet Take 1 tablet (600 mg total) by mouth 2 (two) times daily.  isosorbide mononitrate (IMDUR) 30 MG 24 hr tablet Take 30 mg by mouth daily.  levothyroxine (SYNTHROID, LEVOTHROID) 88 MCG tablet Take 1 tablet (88 mcg total) by mouth daily.  metolazone (ZAROXOLYN) 2.5 MG tablet 1 tab po every Monday and every Friday Patient taking differently: Take 2.5 mg by mouth 2 (two) times a week. 1 tab po every Monday and every Friday  pantoprazole (PROTONIX) 40 MG tablet Take 1 tablet (40 mg total) by mouth daily.  potassium chloride SA (KLOR-CON M20) 20 MEQ tablet Take 1 tablet (20 mEq total) by mouth 2 (two) times daily.  pravastatin (PRAVACHOL) 80 MG tablet Take 1 tablet (80 mg total) by mouth at bedtime.  tiotropium (SPIRIVA HANDIHALER) 18 MCG inhalation capsule PLACE ONE CAPSULE INTO INHALER AND  INHALE  DAILY Patient taking differently: Place 18 mcg into inhaler and inhale at bedtime.   torsemide (DEMADEX) 20 MG tablet 1 tab po bid Patient taking differently: Take 20 mg by mouth 2 (two) times daily. 1 tab po bid  doxycycline (VIBRA-TABS) 100 MG tablet Take 1 tablet (100 mg total) by mouth 2 (two) times daily. Patient not taking: Reported on 12/02/2014  Insulin Glargine (LANTUS SOLOSTAR) 100 UNIT/ML Solostar Pen 10 units SQ qhs Patient taking differently: Inject 10 Units into the skin at bedtime.   insulin lispro (HUMALOG KWIKPEN) 100 UNIT/ML KiwkPen Take 5 units in the morning, 5 units at noon, --if blood sugar over 100 and you eat > 50% of meals Patient taking differently: Inject 7-9 Units into the skin 3 (three) times daily. Take 7 units in the morning, 8 units at noon, and 9 units at dinner  senna-docusate (SENOKOT-S) 8.6-50 MG per tablet Take 1 tablet by mouth at bedtime as needed  for mild constipation.  thiamine (VITAMIN B-1) 100 MG tablet Take 1 tablet (100 mg total) by mouth daily.     History   Social History  . Marital Status: Married    Spouse Name: N/A  . Number of Children: N/A  .  Years of Education: N/A   Occupational History  . Retired    Social History Main Topics  . Smoking status: Former Smoker -- 2.00 packs/day for 50 years    Types: Cigarettes    Quit date: 06/24/2000  . Smokeless tobacco: Never Used  . Alcohol Use: 0.0 oz/week    0 Standard drinks or equivalent per week     Comment: 05/24/2012 "used to drink alot; quit > 10 yr ago"  . Drug Use: No  . Sexual Activity: No   Other Topics Concern  . Not on file   Social History Narrative   Lives in [place] with his wife.    Family Status  Relation Status Death Age  . Mother Deceased   . Father Deceased    Family History  Problem Relation Age of Onset  . Heart attack Father 82  . Heart attack Brother     Vague history     ROS:  Full 14 point review of systems complete and found to be negative unless listed above.  Physical Exam: Blood pressure 124/51, pulse 65, temperature 97.2 F (36.2 C), temperature source Axillary, resp. rate 18, height 5\' 7"  (1.702 m), weight 132 lb 4.4 oz (60 kg), SpO2 94 %.  General: Well developed, well nourished, male in no acute distress Head: Eyes PERRLA, No xanthomas.   Normocephalic and atraumatic, oropharynx without edema or exudate. Dentition: Poor  Lungs: Rales bases Heart: HRRR S1 S2, no rub/gallop, 2/6 murmur. pulses are 2+ and both upper extrem, slightly decreased in lower extremities   Neck: No carotid bruits. No lymphadenopathy.  JVD 8 cm Abdomen: Bowel sounds present, abdomen soft and non-tender without masses or hernias noted. Msk:  No spine or cva tenderness. No weakness, no joint deformities or effusions. Extremities: No clubbing or cyanosis.  No edema.  Neuro: Alert and oriented X 3. No focal deficits noted. Psych:  Good  affect, responds appropriately Skin:  Both arms bandaged and dressed due to multiple skin tears, not disturbed  Labs:   Lab Results  Component Value Date   WBC 8.6 12/03/2014   HGB 10.3* 12/03/2014   HCT 29.5* 12/03/2014   MCV 82.6 12/03/2014   PLT 107* 12/03/2014   No results for input(s): INR in the last 72 hours.   Recent Labs Lab 12/03/14 0730  NA 129*  K 2.4*  CL 83*  CO2 32  BUN 40*  CREATININE 2.48*  CALCIUM 8.5*  PROT 5.8*  BILITOT 1.5*  ALKPHOS 108  ALT 25  AST 74*  GLUCOSE 102*  ALBUMIN 2.2*   MAGNESIUM  Date Value Ref Range Status  12/03/2014 1.2* 1.7 - 2.4 mg/dL Final    Recent Labs  12/02/14 1800 12/03/14 0120 12/03/14 0730  TROPONINI 0.11* 0.10* 0.10*   B NATRIURETIC PEPTIDE  Date/Time Value Ref Range Status  12/02/2014 06:00 PM 262.1* 0.0 - 100.0 pg/mL Final  10/09/2014 10:37 PM 715.8* 0.0 - 100.0 pg/mL Final   Echo: 08/31/2014 Study Conclusions - Left ventricle: The cavity size was normal. Wall thickness was increased in a pattern of mild LVH. Systolic function was mildly to moderately reduced. The estimated ejection fraction was in the range of 40% to 45%. There is hypokinesis of the basal-midinferoseptal myocardium. Features are consistent with a pseudonormal left ventricular filling pattern, with concomitant abnormal relaxation and increased filling pressure (grade 2 diastolic dysfunction). - Mitral valve: There was moderate to severe regurgitation. - Left atrium: The atrium was moderately to severely dilated. - Right ventricle:  The cavity size was moderately dilated. Wall thickness was normal. - Right atrium: The atrium was moderately to severely dilated. - Tricuspid valve: There was moderate regurgitation. - Pulmonary arteries: Systolic pressure was severely increased. PA peak pressure: 78 mm Hg (S). Impressions: - EF has improved since last echocardiogram.   ECG:  Sinus rhythm, wide complex with QRS  duration 184 ms On telemetry, heart rate is 30s at times.  QRS duration in June 2016 was 174 ms  Radiology:  Dg Chest 2 View 12/02/2014   CLINICAL DATA:  Productive cough.  Shortness of breath.  EXAM: CHEST  2 VIEW  COMPARISON:  November 22, 2014.  FINDINGS: Stable cardiomediastinal silhouette. Status post coronary artery bypass graft. Left-sided pacemaker is unchanged in position. Stable calcified pleural plaques are noted bilaterally. No acute pulmonary disease is noted. No pneumothorax or pleural effusion is noted.  IMPRESSION: Stable calcified pleural plaques. No acute cardiopulmonary abnormality seen.   Electronically Signed   By: Marijo Conception, M.D.   On: 12/02/2014 20:28    ASSESSMENT AND PLAN:   The patient was seen today by Dr. Lovena Le, the patient evaluated and the data reviewed.  Principal Problem:   Dizziness - He has both volume issues and possible symptomatic bradycardia - We'll get Medtronic ICD interrogated - Will d/c the Imdur - Check a TSH - MD advise on getting an amiodarone level or decreasing the dose.  - This will be an ongoing issue as he has MR and low-output CHF at baseline. However, EF is up to 40-45%, and he is not currently SOB.  Otherwise per IM. Watch volume carefully with albumin. Will have CHF team see in am. Active Problems:   Hypokalemia   Renal failure (ARF), acute on chronic   Signed: Lenoard Aden 12/03/2014 12:55 PM Beeper [phone]  Cardiology Attending  Patient seen and examined. I agree with the nicely described history from Rosaria Ferries, PA-C. The patient has a low output state resulting in orthostasis. He does not have much in the way of pulmonary congestion by Xray and exam. He has fairly profound hypokalemia. We will hold his diuretic therapy today. No indication at this point to start ionotropes. Will defer additional medication adjustment to advanced heart failure team. He will need to be watched carefully as it will be very easy for  him to develop volume overload.  Mikle Bosworth.D.

## 2014-12-03 NOTE — H&P (Signed)
[facility] History and Physical  [name] ZJI:[id number] DOB: 31-Jul-1935 DOA: 12/02/2014  Referring physician: Dr. Zenia Resides. ER physician. PCP: Tammi Sou, MD  Specialists: Dr. Tempie Hoist. Cardiologist.  Chief Complaint: Dizziness.  HPI: [name] is a 79 y.o. male with history of CAD status post CABG, chronic systolic heart failure last EF measured was 40-45% April 2016, chronic atrial fibrillation not on anticoagulation secondary to GI bleed with gastric AVMs, ischemic cardiomyopathy status post defibrillator placement, COPD and diabetes mellitus presents to the ER because of dizziness. Patient states that he has been feeling increasingly dizzy since yesterday morning. Patient's dizziness is mostly on standing up. Patient also had 2 falls yesterday but patient states he did not hit his head or lose consciousness. Denies any low back pain after fall. In the ER patient is clearly found to have orthostatic changes with systolic blood pressure dropping more than 30 points on standing. Patient's creatinine also has worsened from his baseline. Patient has been admitted for further management. Patient denies any chest pain or shortness of breath. Denies any recent new additional medications. Denies any nausea vomiting or diarrhea.   Review of Systems: As presented in the history of presenting illness, rest negative.  Past Medical History  Diagnosis Date  . VENTRICULAR TACHYCARDIA   . PERIPHERAL VASCULAR DISEASE   . HYPERTENSION     Hypertensive retinopathy-grade II OU  . HYPERCHOLESTEROLEMIA   . CORONARY ARTERY DISEASE     Hx of CABG  . Chronic systolic heart failure     Ischemic cardiomyopathy (01/2014 EF 40-45% with hypokinesis + small area of akinesis  . SEBORRHEIC DERMATITIS   . Chronic renal insufficiency, stage III (moderate)     CrCl 40s-50s  . DIVERTICULOSIS OF COLON   . COPD, severe   . COLONIC POLYPS   . ANXIETY   . ICD (implantable cardiac  defibrillator) in place   . Type II diabetes mellitus     +background diabetic retinopathy OU  . Chronic lower back pain   . DJD (degenerative joint disease)     "hands" (05/24/2012)  . Osteoarthritis of finger   . History of atrial flutter   . Thrombus 12/2013    on ICD lead--started anticoag and his cardioversion for atrial flutter was postponed.  . Colon stricture 2015    with features worrisome for mass, also with recent rising CEA level (possible colon cancer)-GI MD= Dr. Jackelyn Hoehn has been declining colonoscopy since that time.  Dr. Liliane Channel impression on re-eval in 2015 was that the area represented chronic post-diverticulitis changes, not malignancy.  . Macular degeneration, age related, nonexudative     OU  . Chronic blood loss anemia     Transfused 2 U pRBCs April, May, and June (10/26/14) of 2016.  GI w/u has revealed only 2 tiny non-bleeding AVMs in mid small bowel.   Past Surgical History  Procedure Laterality Date  . Coronary artery bypass graft  1990's    CABG X4  . Tonsillectomy  ?1942  . Cardiac defibrillator placement  12/2004    single chamber defibrillator- Medtronic Maxima 8/06 DrKlein [Other][  . Tee without cardioversion N/A 12/22/2013    Procedure: TRANSESOPHAGEAL ECHOCARDIOGRAM (TEE);  Surgeon: Thayer Headings, MD;  Location: [place];  Service: Cardiovascular;  Laterality: N/A;  . Cardioversion N/A 12/22/2013    Procedure: CARDIOVERSION;  Surgeon: Thayer Headings, MD;  Location: [place];  Service: Cardiovascular;  Laterality: N/A;  . Cardiovascular stress test  01/12/13    myocard perf  imaging: previous infarct with a moderately reduced EF as was known previously.  No new findings.   . Gastric emptying scan  02/02/14    Normal  . Transthoracic echocardiogram  01/25/14; 04/2014    EF 40-45%, diffuse hypokinesis, infero-basilar myocardial akinesis, PA pressure slightly high, valves fine.04/2014-Echo today EF ~40% inferior AK moderate to severe MR  . Abdominal  ultrasound  01/2014    GB sludge, o/w normal  . Ct virtual colonoscopy diagnostic  11/2012; repeat 09/2014    Asymmetric thickening of the rectosigmoid colon worrisome for colon cancer, but further eval by pt's GI MD led to conclusion that this was likely sequela of recurrent diverticulitis.  Pt has refused colonoscopy to evaluate this region.  . Cardiovascular stress test      Lexiscan: There is a medium sized fixed inferior wall defect of moderate severity involving the apical inferior and mid inferior and basal inferoseptal segments, consistent with old inferior MI. No significant reversible ischemia.  EF 34%, inferior wall hypokinesis--intermediate risk scan (ok to do planned procedure)  . Implantable cardioverter defibrillator revision N/A 05/25/2012    Procedure: IMPLANTABLE CARDIOVERTER DEFIBRILLATOR REVISION;  Surgeon: Evans Lance, MD;  Location[facility] CATH LAB;  Service: Cardiovascular;  Laterality: N/A;  . Right heart catheterization N/A 08/31/2014    Procedure: RIGHT HEART CATH;  Surgeon: Larey Dresser, MD;  Location: [facility] CATH LAB;  Service: Cardiovascular;  Laterality: N/A;  . Esophagogastroduodenoscopy N/A 09/03/2014    Candida esophagitis + retained gastric contents.  No bleeding. Procedure: ESOPHAGOGASTRODUODENOSCOPY (EGD);  Surgeon: Carol Ada, MD;  Location: [facility] ENDOSCOPY;  Service: Endoscopy;  Laterality: N/A;  . Givens capsule study N/A 10/12/2014    Procedure: GIVENS CAPSULE STUDY;  Surgeon: Irene Shipper, MD;  Location: [place];  Service: Endoscopy;  Laterality: N/A;   Social History:  reports that he quit smoking about 14 years ago. His smoking use included Cigarettes. He has a 100 pack-year smoking history. He has never used smokeless tobacco. He reports that he drinks alcohol. He reports that he does not use illicit drugs. Where does patient live home. Can patient participate in ADLs? Yes.  Allergies  Allergen Reactions  . Niacin Itching    Niaspan     Family History:   Family History  Problem Relation Age of Onset  . Heart attack Father 64  . Heart attack Brother     Vague history      Prior to Admission medications   Medication Sig Start Date End Date Taking? Authorizing Provider  amiodarone (PACERONE) 200 MG tablet Take 1 tablet (200 mg total) by mouth daily. 10/18/14  Yes Ivan Anchors Love, PA-C  Fluticasone-Salmeterol (ADVAIR) 500-50 MCG/DOSE AEPB Inhale 1 puff into the lungs 2 (two) times daily. 04/05/14  Yes Noralee Space, MD  folic acid (FOLVITE) 1 MG tablet Take 1 tablet (1 mg total) by mouth daily. 11/22/14  Yes Tammi Sou, MD  guaiFENesin (MUCINEX) 600 MG 12 hr tablet Take 1 tablet (600 mg total) by mouth 2 (two) times daily. 11/17/14  Yes Irene Pap, NP  isosorbide mononitrate (IMDUR) 30 MG 24 hr tablet Take 30 mg by mouth daily. 11/04/14  Yes Historical Provider, MD  levothyroxine (SYNTHROID, LEVOTHROID) 88 MCG tablet Take 1 tablet (88 mcg total) by mouth daily. 10/25/14  Yes Tammi Sou, MD  metolazone (ZAROXOLYN) 2.5 MG tablet 1 tab po every Monday and every Friday Patient taking differently: Take 2.5 mg by mouth 2 (two) times a week. 1  tab po every Monday and every Friday 11/08/14  Yes Tammi Sou, MD  pantoprazole (PROTONIX) 40 MG tablet Take 1 tablet (40 mg total) by mouth daily. 10/18/14  Yes Ivan Anchors Love, PA-C  potassium chloride SA (KLOR-CON M20) 20 MEQ tablet Take 1 tablet (20 mEq total) by mouth 2 (two) times daily. 10/18/14  Yes Ivan Anchors Love, PA-C  pravastatin (PRAVACHOL) 80 MG tablet Take 1 tablet (80 mg total) by mouth at bedtime. 10/18/14  Yes Ivan Anchors Love, PA-C  tiotropium (SPIRIVA HANDIHALER) 18 MCG inhalation capsule PLACE ONE CAPSULE INTO INHALER AND  INHALE  DAILY Patient taking differently: Place 18 mcg into inhaler and inhale at bedtime.  10/18/14  Yes Ivan Anchors Love, PA-C  torsemide (DEMADEX) 20 MG tablet 1 tab po bid Patient taking differently: Take 20 mg by mouth 2 (two) times daily. 1 tab po bid 11/08/14  Yes  Tammi Sou, MD  doxycycline (VIBRA-TABS) 100 MG tablet Take 1 tablet (100 mg total) by mouth 2 (two) times daily. Patient not taking: Reported on 12/02/2014 11/17/14   Irene Pap, NP  Insulin Glargine (LANTUS SOLOSTAR) 100 UNIT/ML Solostar Pen 10 units SQ qhs Patient taking differently: Inject 10 Units into the skin at bedtime.  02/10/14   Tammi Sou, MD  insulin lispro (HUMALOG KWIKPEN) 100 UNIT/ML KiwkPen Take 5 units in the morning, 5 units at noon, --if blood sugar over 100 and you eat > 50% of meals Patient taking differently: Inject 7-9 Units into the skin 3 (three) times daily. Take 7 units in the morning, 8 units at noon, and 9 units at dinner 10/18/14   Ivan Anchors Love, PA-C  senna-docusate (SENOKOT-S) 8.6-50 MG per tablet Take 1 tablet by mouth at bedtime as needed for mild constipation. 10/18/14   Bary Leriche, PA-C  thiamine (VITAMIN B-1) 100 MG tablet Take 1 tablet (100 mg total) by mouth daily. 11/29/14   Larey Dresser, MD    Physical Exam: Filed Vitals:   12/02/14 2230 12/02/14 2307 12/02/14 2315 12/02/14 2353  BP: 132/51 133/52 134/54 124/46  Pulse: 62 61 63 79  Temp:      TempSrc:      Resp: 18 12 13 20   Height:      Weight:      SpO2: 95% 99% 100% 98%     General:  Moderately built and nourished.  Eyes: Anicteric. No pallor.  ENT: No discharge from the ears eyes nose and mouth.  Neck: No mass felt.  Cardiovascular: S1 and S2 heard.  Respiratory: No rhonchi or crepitations.  Abdomen: Soft nontender bowel sounds present.  Skin: No rash.  Musculoskeletal: Mild lower extremity edema.  Psychiatric: Appears normal.  Neurologic: Alert awake oriented to time place and person. Moves all extremities.  Labs on Admission:  Basic Metabolic Panel:  Recent Labs Lab 12/02/14 1800 12/02/14 1807  NA 128* 127*  K 2.5* 2.5*  CL 79* 81*  CO2 34*  --   GLUCOSE 131* 131*  BUN 41* 44*  CREATININE 2.78* 2.60*  CALCIUM 8.7*  --    Liver Function  Tests: No results for input(s): AST, ALT, ALKPHOS, BILITOT, PROT, ALBUMIN in the last 168 hours. No results for input(s): LIPASE, AMYLASE in the last 168 hours. No results for input(s): AMMONIA in the last 168 hours. CBC:  Recent Labs Lab 12/02/14 1800 12/02/14 1807  WBC 10.3  --   HGB 11.5* 13.6  HCT 33.8* 40.0  MCV 82.8  --  PLT 123*  --    Cardiac Enzymes:  Recent Labs Lab 12/02/14 1800  TROPONINI 0.11*    BNP (last 3 results)  Recent Labs  09/09/14 1150 10/09/14 2237 12/02/14 1800  BNP 550.7* 715.8* 262.1*    ProBNP (last 3 results)  Recent Labs  01/24/14 1909 02/09/14 0945  PROBNP 6721.0* 2555.0*    CBG: No results for input(s): GLUCAP in the last 168 hours.  Radiological Exams on Admission: Dg Chest 2 View  12/02/2014   CLINICAL DATA:  Productive cough.  Shortness of breath.  EXAM: CHEST  2 VIEW  COMPARISON:  November 22, 2014.  FINDINGS: Stable cardiomediastinal silhouette. Status post coronary artery bypass graft. Left-sided pacemaker is unchanged in position. Stable calcified pleural plaques are noted bilaterally. No acute pulmonary disease is noted. No pneumothorax or pleural effusion is noted.  IMPRESSION: Stable calcified pleural plaques. No acute cardiopulmonary abnormality seen.   Electronically Signed   By: Marijo Conception, M.D.   On: 12/02/2014 20:28    EKG: Independently reviewed. Sinus rhythm with LBBB.  Assessment/Plan Principal Problem:   Dizziness Active Problems:   Hypokalemia   Renal failure (ARF), acute on chronic   1. Dizziness with orthostatic changes - suspect most likely dehydration causing dizziness. Holding off patient's diuretics for now. I have ordered to 50 mL normal saline bolus. Recheck orthostatics again in a.m. Physical therapy consult. 2. Acute on chronic renal failure - probably from dehydration. See #1. Hold diuretics. Closely follow intake output and metabolic panel. 3. Hypokalemia - oral potassium replacement has  been given. Recheck metabolic panel. Check magnesium levels. Hypokalemia may be from diuretics. 4. CAD status post CABG - patient has elevated troponin but denies any chest pain. Cycle cardiac markers. 5. Chronic systolic heart failure last evening showed in April 2016 was 40-45% with grade 2 diastolic dysfunction - holding diuretics secondary to #1. 6. Hyponatremia - patient does have chronic systolic heart failure but at this time may be from dehydration. Procedure: Metabolic panel. 7. Chronic atrial fibrillation - presently rate controlled. Not on anticoagulation secondary to GI bleed secondary to AVMs. Continue rate limiting medications. 8. COPD - presently not wheezing continue inhalers. 9. History of diabetes mellitus - patient is not on any anti-diabetic medications. Patient has been placed on sliding scale coverage.   DVT Prophylaxis SCDs.  Code Status: Full code.  Family Communication: Discussed with patient.  Disposition Plan: Admit to inpatient.    Humna Moorehouse N. [facility] Pager [phone].  If 7PM-7AM, please contact night-coverage [email or web address] Password [email or web address] 12/03/2014, 12:08 AM

## 2014-12-04 ENCOUNTER — Other Ambulatory Visit (HOSPITAL_COMMUNITY): Payer: Medicare Other

## 2014-12-04 LAB — COMPREHENSIVE METABOLIC PANEL
ALT: 24 U/L (ref 17–63)
ANION GAP: 9 (ref 5–15)
AST: 65 U/L — AB (ref 15–41)
Albumin: 2.2 g/dL — ABNORMAL LOW (ref 3.5–5.0)
Alkaline Phosphatase: 106 U/L (ref 38–126)
BUN: 35 mg/dL — ABNORMAL HIGH (ref 6–20)
CALCIUM: 8.5 mg/dL — AB (ref 8.9–10.3)
CO2: 32 mmol/L (ref 22–32)
Chloride: 86 mmol/L — ABNORMAL LOW (ref 101–111)
Creatinine, Ser: 2.57 mg/dL — ABNORMAL HIGH (ref 0.61–1.24)
GFR calc Af Amer: 26 mL/min — ABNORMAL LOW (ref >60)
GFR, EST NON AFRICAN AMERICAN: 23 mL/min — AB (ref >60)
GLUCOSE: 124 mg/dL — AB (ref 65–99)
POTASSIUM: 4.5 mmol/L (ref 3.5–5.1)
Sodium: 127 mmol/L — ABNORMAL LOW (ref 135–145)
TOTAL PROTEIN: 5.6 g/dL — AB (ref 6.5–8.1)
Total Bilirubin: 1.6 mg/dL — ABNORMAL HIGH (ref 0.3–1.2)

## 2014-12-04 LAB — CBC
HCT: 28.2 % — ABNORMAL LOW (ref 39.0–52.0)
Hemoglobin: 9.4 g/dL — ABNORMAL LOW (ref 13.0–17.0)
MCH: 27.3 pg (ref 26.0–34.0)
MCHC: 33.3 g/dL (ref 30.0–36.0)
MCV: 82 fL (ref 78.0–100.0)
PLATELETS: 115 10*3/uL — AB (ref 150–400)
RBC: 3.44 MIL/uL — ABNORMAL LOW (ref 4.22–5.81)
RDW: 16.7 % — ABNORMAL HIGH (ref 11.5–15.5)
WBC: 8.3 10*3/uL (ref 4.0–10.5)

## 2014-12-04 LAB — MAGNESIUM: Magnesium: 1.7 mg/dL (ref 1.7–2.4)

## 2014-12-04 LAB — GLUCOSE, CAPILLARY
GLUCOSE-CAPILLARY: 129 mg/dL — AB (ref 65–99)
GLUCOSE-CAPILLARY: 120 mg/dL — AB (ref 65–99)
Glucose-Capillary: 167 mg/dL — ABNORMAL HIGH (ref 65–99)
Glucose-Capillary: 132 mg/dL — ABNORMAL HIGH (ref 65–99)

## 2014-12-04 LAB — TSH: TSH: 3.868 u[IU]/mL (ref 0.350–4.500)

## 2014-12-04 MED ORDER — INSULIN GLARGINE 100 UNIT/ML ~~LOC~~ SOLN
5.0000 [IU] | Freq: Every day | SUBCUTANEOUS | Status: DC
  Administered 2014-12-04 – 2014-12-05 (×2): 5 [IU] via SUBCUTANEOUS
  Filled 2014-12-04 (×2): qty 0.05

## 2014-12-04 MED ORDER — GLUCERNA SHAKE PO LIQD
237.0000 mL | Freq: Two times a day (BID) | ORAL | Status: DC
  Administered 2014-12-05: 237 mL via ORAL
  Filled 2014-12-04: qty 237

## 2014-12-04 MED ORDER — MAGNESIUM OXIDE 400 (241.3 MG) MG PO TABS
400.0000 mg | ORAL_TABLET | Freq: Three times a day (TID) | ORAL | Status: DC
  Administered 2014-12-04 – 2014-12-05 (×3): 400 mg via ORAL
  Filled 2014-12-04 (×5): qty 1

## 2014-12-04 MED ORDER — ADULT MULTIVITAMIN W/MINERALS CH
1.0000 | ORAL_TABLET | Freq: Every day | ORAL | Status: DC
  Administered 2014-12-04 – 2014-12-05 (×2): 1 via ORAL
  Filled 2014-12-04 (×2): qty 1

## 2014-12-04 NOTE — Progress Notes (Signed)
[facility] PROGRESS NOTE  [name] [id number]:[id number] DOB: [birth year] DOA: 12/02/2014 PCP: [name] Sou, MD  Assessment/Plan: Principal Problem:   Dizziness Active Problems:   Hypokalemia   Renal failure (ARF), acute on chronic    1. Orthostatic hypotension  orthostatic dizziness vs arrythmia . Steadily declining . On torsemide 20 bid + metolazone biw at home. Hydralazine was stopped early last week with low BP, Imdur continued. s/p fluid boluses upto 1L Hold  hydralazine/Imdur.   Hold metolazone for now and decrease torsemide to 20 mg daily with KCl 20 daily starting  7/12 per cardiology . - Check orthostatics today with PT.  S/p 12.5 g iv albumin   2. Severe hypokalemia , hyponatremia , hypomagnesemia , repleted , sodium low due to chf,, diuretics , continue fluid restriction   3. Chronic systolic CHF: EF 44-81% with moderate-severe MR. Functional decline this year.  - As above, will leave off hydralazine/Imdur.  Cardiology to address CODE STATUS and discuss prognosis with family     3. Bradycardia: ICD interrogation showed episode of HR < 30 without pacer spikes, Consult  EP today per cards . Currently appears to be NSR in 50s-60s.   4. Atrial fibrillation: Paroxysmal. Continue amiodarone. Not anticoagulated with fall risk (frequent falls) and h/o bleeding from AVMs. off eliquis   5. CAD: s/p CABG. Mild increase in troponin with no trend. Suspect demand ischemia.    6. COPD, severe, cont dulera, spiriva   7. Type II diabetes mellitus, restart lantus, SSI   8. Chronic blood loss anemia  Transfused 2 U pRBCs April, May, and June (10/26/14) of 2016. GI w/u has revealed only 2 tiny non-bleeding AVMs in mid small bowel.  9. Hypothyroidism ,cont synthroid   10. CKD ,stage 4,, likely cardiorenal, follow   Code Status:      Code Status Orders        Start     Ordered   12/03/14 0013  Full code   Continuous     12/03/14 0012     Advance Directive Documentation        Most Recent Value   Type of Advance Directive  Healthcare Power of Attorney, Living will   Pre-existing out of facility DNR order (yellow form or pink MOST form)     "MOST" Form in Place?       Family Communication: family updated about patient's clinical progress Disposition Plan:  As above    Brief narrative: 79 y.o. year old male with a history of HTN, CAD s/p CABG, ICM s/p Medtronic ICD, chronic systolic heart failure, DM2, Vtach, atrial flutter, PVD and GI mass. ECHO (12/22/13) EF 20-25%, EF 40% by echo 12/15 with Mod-severe MR.   01/2014 admitted with volume overload and low output CHF. Discharge weight 151 pounds, creatinine 1.42.  April 2016, admitted twice with CHF and cardiorenal syndrome requiring milrinone. He also had an GI bleed at that time and had candidal esophagitis with 2 colonic AVMs. Eliquis was stopped due to recurrent bleeding. Later in April, he had pneumonia. Discharge weight 147.  He has been hyponatremic in the past and started on sodium chloride tablets.   11/20/2014, he was seen in the office by Dr. Haroldine [name] on . His weight at that time was 146 pounds. His volume status was stable. It was felt that he would benefit from a cardiomems device but this has not happened yet. His MR is to be followed closely and if his LV begins  dilating, TEE will then be performed. His operative all was checked and was okay and his ICD was interrogated and it was also okay. His sodium at that time was 123 with a chloride of 84, potassium of 3.5, BUN 35 and creatinine 2.17.   11/28/2014, he had paracentesis with 600 mL removed.  11/29/2014, his weight was down to 136 pounds, he had multiple skin tears on his arms and 2-3 plus lower extremity edema. He was orthostatic with a systolic blood pressure dropping from 110 sitting to 70 with standing. He was advised to stop Imdur and hydralazine.  12/02/2014, his wife called stating that he was  falling and dizzy and he was advised to bring him to the hospital. Later that evening she did so. Admission labs showed a sodium of 128, potassium 2.5, BUN 41, creatinine 2.78 and chloride 79. Today's labs are below. He was significantly orthostatic on admission with S/P be decreasing from 125 down to 65 going from lying to standing. His current weight is 132 pounds.  His wife states that [name] has been eating very poorly. He has had no chest pain and feels that his shortness of breath is at baseline. He has no appetite. He has been urinating very frequently and she states he is putting out large amounts. He denies any cramping pains, tremors or any kind of jerking. He has had no palpitations. Dr. Donney [name] is ordering albumen in the hopes of correcting his albumen and hopefully this will improve his volume status. He is to get potassium totaling 200 mEq today. He has already had 80 mEq. He also got 2 g of IV magnesium. He feels weak but otherwise feels okay.   Consultants:  cardiology  Procedures:  None   Antibiotics: Anti-infectives    None          HPI/Subjective: Denies any dizziness currently but not getting out of bed. Says his weight was going down at home, and he is 11 lbs down since discharge in June Denies any SOB currently  Objective: Filed Vitals:   12/03/14 2155 12/04/14 0100 12/04/14 0510 12/04/14 0800  BP: 109/45 102/45 100/51   Pulse: 60 61 59   Temp: 97.4 F (36.3 C) 98.4 F (36.9 C)    TempSrc: Oral Oral    Resp: 20 20 20    Height:      Weight:   60.419 kg (133 lb 3.2 oz)   SpO2: 100% 96% 96% 97%    Intake/Output Summary (Last 24 hours) at 12/04/14 1117 Last data filed at 12/04/14 0857  Gross per 24 hour  Intake   1060 ml  Output   1555 ml  Net   -495 ml    Exam:  General: No acute respiratory distress Lungs: Clear to auscultation bilaterally without wheezes or crackles Cardiovascular: Regular rate and rhythm without murmur gallop or rub  normal S1 and S2 Abdomen: Nontender, nondistended, soft, bowel sounds positive, no rebound, no ascites, no appreciable mass Extremities: No significant cyanosis, clubbing, or edema bilateral lower extremities     Data Review   Micro Results Recent Results (from the past 240 hour(s))  MRSA PCR Screening     Status: None   Collection Time: 12/03/14 12:27 AM  Result Value Ref Range Status   MRSA by PCR NEGATIVE NEGATIVE Final    Comment:        The GeneXpert MRSA Assay (FDA approved for NASAL specimens only), is one component of a comprehensive MRSA colonization surveillance program. It is  not intended to diagnose MRSA infection nor to guide or monitor treatment for MRSA infections.     Radiology Reports Dg Chest 2 View  12/02/2014   CLINICAL DATA:  Productive cough.  Shortness of breath.  EXAM: CHEST  2 VIEW  COMPARISON:  November 22, 2014.  FINDINGS: Stable cardiomediastinal silhouette. Status post coronary artery bypass graft. Left-sided pacemaker is unchanged in position. Stable calcified pleural plaques are noted bilaterally. No acute pulmonary disease is noted. No pneumothorax or pleural effusion is noted.  IMPRESSION: Stable calcified pleural plaques. No acute cardiopulmonary abnormality seen.   Electronically Signed   By: Marijo Conception, M.D.   On: 12/02/2014 20:28   Dg Ribs Unilateral W/chest Left  11/22/2014   CLINICAL DATA:  Left lower rib pain for 2 weeks  EXAM: LEFT RIBS AND CHEST - 3+ VIEW  COMPARISON:  None.  FINDINGS: No fracture or other bone lesions are seen involving the ribs. There is no evidence of pneumothorax or pleural effusion. The lungs are hyperinflated likely secondary to COPD. There are bilateral calcified pleural plaques. There is evidence of prior CABG. There is a dual lead cardiac pacer. Heart size and mediastinal contours are within normal limits.  IMPRESSION: No acute osseous injury of the left ribs.   Electronically Signed   By: Kathreen Devoid   On:  11/22/2014 12:37   US Paracentesis  11/28/2014   INDICATION: Symptomatic ascites.  EXAM: ULTRASOUND-GUIDED PARACENTESIS  COMPARISON:  None.  MEDICATIONS: 10 cc 1% lidocaine  COMPLICATIONS: None immediate  TECHNIQUE: Informed written consent was obtained from the patient after a discussion of the risks, benefits and alternatives to treatment. A timeout was performed prior to the initiation of the procedure.  Initial ultrasound scanning demonstrates a moderate amount of ascites within the right lower abdominal quadrant. The right lower abdomen was prepped and draped in the usual sterile fashion. 1% lidocaine with epinephrine was used for local anesthesia. Under direct ultrasound guidance, a 19 gauge, 7-cm, Yueh catheter was introduced. An ultrasound image was saved for documentation purposed. The paracentesis was performed. The catheter was removed and a dressing was applied. The patient tolerated the procedure well without immediate post procedural complication.  FINDINGS: A total of approximately 600 cc of yellow fluid was removed.  IMPRESSION: Successful ultrasound-guided paracentesis yielding 600 cc of peritoneal fluid.  Read by:  Lavonia Drafts [facility]   Electronically Signed   By: Sandi Mariscal M.D.   On: 11/28/2014 15:39     CBC  Recent Labs Lab 12/02/14 1800 12/02/14 1807 12/03/14 0730 12/04/14 0336  WBC 10.3  --  8.6 8.3  HGB 11.5* 13.6 10.3* 9.4*  HCT 33.8* 40.0 29.5* 28.2*  PLT 123*  --  107* 115*  MCV 82.8  --  82.6 82.0  MCH 28.2  --  28.9 27.3  MCHC 34.0  --  34.9 33.3  RDW 16.4*  --  16.4* 16.7*  LYMPHSABS  --   --  1.7  --   MONOABS  --   --  1.0  --   EOSABS  --   --  0.1  --   BASOSABS  --   --  0.0  --     Chemistries   Recent Labs Lab 12/02/14 1800 12/02/14 1807 12/03/14 0120 12/03/14 0730 12/03/14 1210 12/04/14 0336  NA 128* 127*  --  129* 126* 127*  K 2.5* 2.5*  --  2.4* 2.8* 4.5  CL 79* 81*  --  83* 81* 86*  CO2 34*  --   --  32 34* 32  GLUCOSE 131* 131*  --   102* 135* 124*  BUN 41* 44*  --  40* 36* 35*  CREATININE 2.78* 2.60*  --  2.48* 2.54* 2.57*  CALCIUM 8.7*  --   --  8.5* 8.4* 8.5*  MG  --   --  1.2*  --  1.7 1.7  AST  --   --   --  74* 75* 65*  ALT  --   --   --  25 25 24   ALKPHOS  --   --   --  108 115 106  BILITOT  --   --   --  1.5* 1.5* 1.6*   ------------------------------------------------------------------------------------------------------------------ estimated creatinine clearance is 20.6 mL/min (by C-G formula based on Cr of 2.57). ------------------------------------------------------------------------------------------------------------------ No results for input(s): HGBA1C in the last 72 hours. ------------------------------------------------------------------------------------------------------------------ No results for input(s): CHOL, HDL, LDLCALC, TRIG, CHOLHDL, LDLDIRECT in the last 72 hours. ------------------------------------------------------------------------------------------------------------------  Recent Labs  12/04/14 0336  TSH 3.868   ------------------------------------------------------------------------------------------------------------------ No results for input(s): VITAMINB12, FOLATE, FERRITIN, TIBC, IRON, RETICCTPCT in the last 72 hours.  Coagulation profile No results for input(s): INR, PROTIME in the last 168 hours.  No results for input(s): DDIMER in the last 72 hours.  Cardiac Enzymes  Recent Labs Lab 12/03/14 0120 12/03/14 0730 12/03/14 1210  TROPONINI 0.10* 0.10* 0.09*   ------------------------------------------------------------------------------------------------------------------ Invalid input(s): POCBNP   CBG:  Recent Labs Lab 12/03/14 0621 12/03/14 1111 12/03/14 1610 12/03/14 2209 12/04/14 0548  GLUCAP 124* 137* 116* 135* 129*       Studies: Dg Chest 2 View  12/02/2014   CLINICAL DATA:  Productive cough.  Shortness of breath.  EXAM: CHEST  2 VIEW   COMPARISON:  November 22, 2014.  FINDINGS: Stable cardiomediastinal silhouette. Status post coronary artery bypass graft. Left-sided pacemaker is unchanged in position. Stable calcified pleural plaques are noted bilaterally. No acute pulmonary disease is noted. No pneumothorax or pleural effusion is noted.  IMPRESSION: Stable calcified pleural plaques. No acute cardiopulmonary abnormality seen.   Electronically Signed   By: Marijo Conception, M.D.   On: 12/02/2014 20:28      Lab Results  Component Value Date   HGBA1C 5.1 11/04/2014   HGBA1C 5.3 09/09/2014   HGBA1C 6.0* 08/31/2014   Lab Results  Component Value Date   MICROALBUR 0.9 03/13/2014   LDLCALC 53 02/01/2014   CREATININE 2.57* 12/04/2014       Scheduled Meds: . amiodarone  200 mg Oral Daily  . feeding supplement (GLUCERNA SHAKE)  237 mL Oral BID BM  . folic acid  1 mg Oral Daily  . guaiFENesin  600 mg Oral BID  . insulin aspart  0-9 Units Subcutaneous TID WC  . levothyroxine  88 mcg Oral QAC breakfast  . magnesium oxide  400 mg Oral TID  . mometasone-formoterol  2 puff Inhalation BID  . multivitamin with minerals  1 tablet Oral Daily  . pantoprazole  40 mg Oral Daily  . pravastatin  80 mg Oral QHS  . sodium chloride  3 mL Intravenous Q12H  . tiotropium  18 mcg Inhalation QHS   Continuous Infusions:   Principal Problem:   Dizziness Active Problems:   Hypokalemia   Renal failure (ARF), acute on chronic    Time spent: 45 minutes   [facility] Pager [phone]. If 7PM-7AM, please contact night-coverage at [email or web address], password [facility] 12/04/2014, 11:17 AM  LOS: 2 days

## 2014-12-04 NOTE — Progress Notes (Addendum)
Advanced Heart Failure Rounding Note  PCP: Tammi Sou, MD Primary Cardiologist: Dr. Haroldine Laws  Subjective:     Feels a lot better since his admission.  Denies any dizziness currently but not getting out of bed.  Says his weight was going down at home, and he is 11 lbs down since discharge in June.  States he was taking all of his medications as directed and denied taking any extra or missing doses.  Says he could walk 3-5 minutes prior to becoming SOB.  Denies any SOB currently.   Objective:   Weight Range: 133 lb 3.2 oz (60.419 kg) Body mass index is 20.86 kg/(m^2).   Vital Signs:   Temp:  [97.4 F (36.3 C)-98.4 F (36.9 C)] 98.4 F (36.9 C) (07/11 0100) Pulse Rate:  [59-61] 59 (07/11 0510) Resp:  [20] 20 (07/11 0510) BP: (100-109)/(39-51) 100/51 mmHg (07/11 0510) SpO2:  [89 %-100 %] 97 % (07/11 0800) Weight:  [133 lb 3.2 oz (60.419 kg)] 133 lb 3.2 oz (60.419 kg) (07/11 0510)    Weight change: Filed Weights   12/02/14 1750 12/03/14 0012 12/04/14 0510  Weight: 136 lb 11.2 oz (62.007 kg) 132 lb 4.4 oz (60 kg) 133 lb 3.2 oz (60.419 kg)    Intake/Output:   Intake/Output Summary (Last 24 hours) at 12/04/14 0816 Last data filed at 12/04/14 0700  Gross per 24 hour  Intake    940 ml  Output   1905 ml  Net   -965 ml     Physical Exam: General: Pleasant, NAD. Neuro: Alert and oriented X 3. Moves all extremities spontaneously. Psych: Normal affect. HEENT: Normal Neck: Supple without bruits.  JVP 7-8 cm. Lungs: Resp regular and unlabored, CTA bilaterally. Heart: RRR. Distant heart sounds. 2/6 HSM at apex.  Abdomen: Soft, non-tender, non-distended, BS + x 4.  Extremities: No clubbing, cyanosis. DP/PT/Radials 2+ and equal bilaterally. No edema. SCDs in place.   Telemetry:  Sinus brady in high 50s.  Apparent P waves.    Labs: CBC  Recent Labs  12/03/14 0730 12/04/14 0336  WBC 8.6 8.3  NEUTROABS 5.8  --   HGB 10.3* 9.4*  HCT 29.5* 28.2*  MCV 82.6  82.0  PLT 107* 626*   Basic Metabolic Panel  Recent Labs  12/03/14 1210 12/04/14 0336  NA 126* 127*  K 2.8* 4.5  CL 81* 86*  CO2 34* 32  GLUCOSE 135* 124*  BUN 36* 35*  CALCIUM 8.4* 8.5*  MG 1.7 1.7   Liver Function Tests  Recent Labs  12/03/14 1210 12/04/14 0336  AST 75* 65*  ALT 25 24  ALKPHOS 115 106  BILITOT 1.5* 1.6*  PROT 5.9* 5.6*  ALBUMIN 2.2* 2.2*   No results for input(s): LIPASE, AMYLASE in the last 72 hours. Cardiac Enzymes  Recent Labs  12/03/14 0120 12/03/14 0730 12/03/14 1210  TROPONINI 0.10* 0.10* 0.09*    BNP: BNP (last 3 results)  Recent Labs  09/09/14 1150 10/09/14 2237 12/02/14 1800  BNP 550.7* 715.8* 262.1*    ProBNP (last 3 results)  Recent Labs  01/24/14 1909 02/09/14 0945  PROBNP 6721.0* 2555.0*     D-Dimer No results for input(s): DDIMER in the last 72 hours. Hemoglobin A1C No results for input(s): HGBA1C in the last 72 hours. Fasting Lipid Panel No results for input(s): CHOL, HDL, LDLCALC, TRIG, CHOLHDL, LDLDIRECT in the last 72 hours. Thyroid Function Tests  Recent Labs  12/04/14 0336  TSH 3.868    Other results:  Imaging/Studies:  Dg Chest 2 View  12/02/2014   CLINICAL DATA:  Productive cough.  Shortness of breath.  EXAM: CHEST  2 VIEW  COMPARISON:  November 22, 2014.  FINDINGS: Stable cardiomediastinal silhouette. Status post coronary artery bypass graft. Left-sided pacemaker is unchanged in position. Stable calcified pleural plaques are noted bilaterally. No acute pulmonary disease is noted. No pneumothorax or pleural effusion is noted.  IMPRESSION: Stable calcified pleural plaques. No acute cardiopulmonary abnormality seen.   Electronically Signed   By: Marijo Conception, M.D.   On: 12/02/2014 20:28     Latest Echo  Latest Cath   Medications:     Scheduled Medications: . amiodarone  200 mg Oral Daily  . folic acid  1 mg Oral Daily  . guaiFENesin  600 mg Oral BID  . insulin aspart  0-9 Units  Subcutaneous TID WC  . levothyroxine  88 mcg Oral QAC breakfast  . magnesium oxide  400 mg Oral BID  . mometasone-formoterol  2 puff Inhalation BID  . pantoprazole  40 mg Oral Daily  . pravastatin  80 mg Oral QHS  . sodium chloride  3 mL Intravenous Q12H  . tiotropium  18 mcg Inhalation QHS     Infusions:     PRN Medications:  acetaminophen **OR** acetaminophen, ondansetron **OR** ondansetron (ZOFRAN) IV   Assessment/Plan   1. Dizziness - Prompted admission - ? Volume and possible symptomatic bradycardia - Medtronic ICD interrogated 12/03/14 2. Chronic Combined HF NYHA Class 3b, Ischemic Cardiomyopathy - Echo 08/31/14 showed 40-45% with grade II 2 DD - Small amount of fluid on board, but no SOB currently. - Out 1 L this admit.  Down 11 lbs since discharge 1 month ago - HTN meds being held currently with orthostasis on arrival, will resume as able. - No ACE/ARB in setting of CKD. - No BB with bradycardia - Will discuss optimal time of resumption of meds and diuresis with MD. 3. AKI on CKD, Stage 4 - Cr 2.57 currently and relatively stable. - Baseline Cr appears to be around 2-2.1 recently.  4. Chronic A fib - Medtronic ICD - interrogation showed episode of HR < 30 with no visible spikes, RV threshold trending up.  Will ask EP to comment on this.  - Rate and rhythm controlled currently with ICD in place - On Amiodarone 200 mg daily - Not on anticoag 2/2 falls and recurrent bleeding on eliquis - This patients CHA2DS2-VASc Score and unadjusted Ischemic Stroke Rate is 9.7 % stroke rate/year from a score of 6  5. Hypokalemia - Corrected. Continue to supp as necessary and follow closely.  Length of Stay: 2   [name] PA-C 12/04/2014, 8:16 AM  Advanced Heart Failure Team Pager [phone] (M-F; 7a - 4p)  Please contact [facility] for night-coverage after hours (4p -7a ) and weekends on [email or web address]  Patient seen with PA, agree with the above note.  1.  Orthostatic hypotension: Patient was admitted with orthostatic dizziness.  On review of notes, he has been declining in terms of health fairly steadily over the last few months.  Not eating well. Has been on torsemide 20 bid + metolazone biw at home.  Hydralazine was stopped early last week with low BP, Imdur continued.  Patient feels better here. - Leave off hydralazine/Imdur.  - He will need some diuresis, will likely need to cut back though.  Hold metolazone for now and decrease torsemide to 20 mg daily with KCl 20 daily starting tomorrow.  Will continue to hold diuretics today (not volume overloaded on exam).  - Check orthostatics today, work with PT.  2. Chronic systolic CHF: EF 96-78% with moderate-severe MR.  Functional decline this year.   - As above, will leave off hydralazine/Imdur.  - Start back lower dose diuretics probably tomorrow.  3. Bradycardia: ICD interrogation showed episode of HR < 30 without pacer spikes, RV threshold increasing.  Will have EP evaluate. Currently appears to be NSR in 50s-60s.  4. Atrial fibrillation: Paroxysmal.  Continue amiodarone.  Not anticoagulated with fall risk (frequent falls) and h/o bleeding from AVMs.  5. CAD: s/p CABG.  Mild increase in troponin with no trend.  Suspect demand ischemia.  6. Very weak, needs to work with PT.   [name] 12/04/2014 9:25 AM

## 2014-12-04 NOTE — Care Management (Signed)
Important Message  Patient Details  Name: [name] MRN: [id number] Date of Birth: 01/20/1936   Medicare Important Message Given:  Yes-second notification given    Nathen May 12/04/2014, 2:37 PM

## 2014-12-04 NOTE — Progress Notes (Signed)
[facility]  Patient Status: Active with visits up until hospitalization  [facility] is providing the following services: SN, PT  If patient discharges after hours, please call [phone].   [name] 12/04/2014, 10:21 AM

## 2014-12-04 NOTE — Evaluation (Signed)
Occupational Therapy Evaluation Patient Details Name: [name] MRN: [id number] DOB: Jul 01, 1935 Today's Date: 12/04/2014    History of Present Illness Patient is a 79 yo male admitted 12/02/14 with dizziness and 2 falls.  Patient with orthostatic hypotension in ED.  PMH:  HTN, CAD, CABG, ICM, ICD, CHF, DM, arythmias, Mod/severe mitral regurgitation.   Clinical Impression   Pt was performing bathing and toileting at a modified independent level and assisted for LB dressing, particularly compression hose.  Pt self feeds and grooms independently, but it is more challenging with his tremor is present.  Pt presents with generalized weakness, orthostasis and impaired balance interfering with ability to perform ADL and ADL transfers.  Will follow acutely.    Follow Up Recommendations  Home health OT;Supervision/Assistance - 24 hour    Equipment Recommendations  None recommended by OT    Recommendations for Other Services       Precautions / Restrictions Precautions Precautions: Fall Precaution Comments: watch BP  Restrictions Weight Bearing Restrictions: No      Mobility Bed Mobility  Pt in chair.              Transfers Overall transfer level: Needs assistance Equipment used: Rolling walker (2 wheeled) Transfers: Sit to/from Stand Sit to Stand: Min guard         General transfer comment: for safety due to orthrostasis    Balance Overall balance assessment: Needs assistance Sitting-balance support: Feet supported Sitting balance-Leahy Scale: Good       Standing balance-Leahy Scale: Fair                              ADL Overall ADL's : Needs assistance/impaired Eating/Feeding: Independent;Sitting Eating/Feeding Details (indicate cue type and reason): reports a poor appetite, tremor does interfere on some days Grooming: Wash/dry hands;Min guard;Standing   Upper Body Bathing: Set up;Sitting   Lower Body Bathing: Sit to/from stand;Min  guard Lower Body Bathing Details (indicate cue type and reason): pt easily bends over to reach feet, able to stand and reach periarea with min guard assist Upper Body Dressing : Set up;Sitting   Lower Body Dressing: Min guard;Sit to/from stand Lower Body Dressing Details (indicate cue type and reason): wife helps with compression hose at home Toilet Transfer: Min guard;Ambulation;RW   Toileting- Water quality scientist and Hygiene: Min guard;Sit to/from stand       Functional mobility during ADLs: Min guard;Rolling walker       Vision Additional Comments: demonstrated reading small print   Perception     Praxis      Pertinent Vitals/Pain Pain Assessment: No/denies pain     Hand Dominance Right   Extremity/Trunk Assessment Upper Extremity Assessment Upper Extremity Assessment: Generalized weakness ( B tremor, reports he only has when he is sick)   Lower Extremity Assessment Lower Extremity Assessment: Defer to PT evaluation       Communication Communication Communication: No difficulties   Cognition Arousal/Alertness: Awake/alert Behavior During Therapy: WFL for tasks assessed/performed Overall Cognitive Status: Within Functional Limits for tasks assessed                     General Comments       Exercises       Shoulder Instructions      Home Living Family/patient expects to be discharged to:: Private residence Living Arrangements: Spouse/significant other Available Help at Discharge: Family;Available 24 hours/day Type of Home: House Home Access: Stairs to  enter Entrance Stairs-Number of Steps: 3 Entrance Stairs-Rails: Right;Left Home Layout: One level     Bathroom Shower/Tub: Walk-in shower;Door   [employer] Toilet: Standard Bathroom Accessibility: Yes How Accessible: Accessible via walker Home Equipment: Walker - 2 wheels;Walker - 4 wheels;Shower seat;Grab bars - tub/shower          Prior Functioning/Environment Level of Independence:  Independent with assistive device(s);Needs assistance  Gait / Transfers Assistance Needed: Uses RW for ambulation.  Has had falls at home ADL's / Homemaking Assistance Needed: Wife assists with lower body dressing, meal prep, housekeeping   Comments: Has stopped driving.    OT Diagnosis: Generalized weakness   OT Problem List: Decreased strength;Decreased activity tolerance;Impaired balance (sitting and/or standing);Cardiopulmonary status limiting activity   OT Treatment/Interventions: Self-care/ADL training;Patient/family education;Therapeutic activities    OT Goals(Current goals can be found in the care plan section) Acute Rehab OT Goals Patient Stated Goal: To feel better. OT Goal Formulation: With patient Time For Goal Achievement: 12/18/14 Potential to Achieve Goals: Good  OT Frequency: Min 2X/week   Barriers to D/C:            Co-evaluation              End of Session    Activity Tolerance: Patient tolerated treatment well Patient left: in chair;with call bell/phone within reach;with chair alarm set   Time: 1425-1445 OT Time Calculation (min): 20 min Charges:  OT General Charges $OT Visit: 1 Procedure OT Evaluation $Initial OT Evaluation Tier I: 1 Procedure G-Codes:    Malka So 12/04/2014, 3:23 PM  [phone]

## 2014-12-04 NOTE — Progress Notes (Signed)
Initial Nutrition Assessment  INTERVENTION:  Glucerna shake, MVI  NUTRITION DIAGNOSIS:  Inadequate oral intake related to acute illness as evidenced by percent weight loss.   GOAL:  Patient will meet greater than or equal to 90% of their needs   MONITOR:  PO intake, Supplement acceptance, Weight trends, Labs, Skin, I & O's  REASON FOR ASSESSMENT:  Malnutrition Screening Tool    ASSESSMENT: 79 y.o. male with history of CAD status post CABG, chronic systolic heart failure l, chronic atrial fibrillation, ischemic cardiomyopathy status post defibrillator placement, COPD and diabetes mellitus presents to the ER because of dizziness.   Pt asleep at time of visit. Pt appears thin with mild orbital wasting and moderate temporal wasting. Per malnutrition screening tool, pt reported  Eating poorly due to a decreased appetite with a 10 lb weight loss in the past month. Per weight history pt has lost 11 lbs in the past month-8% weight loss is severe for time frame. Per MD note, wife reported that pt has been eating very poorly and has no appetite.  Per nursing notes pt ate 100% of breakfast this morning, 55-65% of 3 meals yesterday.   Labs: low sodium, low chloride, low calcium, elevated creatinine, low GFR, low hemoglobin  Height:  Ht Readings from Last 1 Encounters:  12/03/14 5\' 7"  (1.702 m)    Weight:  Wt Readings from Last 1 Encounters:  12/04/14 133 lb 3.2 oz (60.419 kg)    Ideal Body Weight:  67.3 kg  Wt Readings from Last 10 Encounters:  12/04/14 133 lb 3.2 oz (60.419 kg)  11/22/14 146 lb (66.225 kg)  11/20/14 146 lb 4 oz (66.339 kg)  11/17/14 139 lb (63.05 kg)  11/13/14 137 lb 12.8 oz (62.506 kg)  11/08/14 139 lb (63.05 kg)  11/03/14 144 lb 14.4 oz (65.726 kg)  10/30/14 147 lb 12.8 oz (67.042 kg)  10/26/14 144 lb 4 oz (65.431 kg)  10/24/14 151 lb (68.493 kg)    BMI:  Body mass index is 20.86 kg/(m^2).  Estimated Nutritional Needs:  Kcal:   1600-1800  Protein:  90-100 grams  Fluid:  1.8 L/day  Skin:  Wound (see comment) (skin lacerations on right and left arm)  Diet Order:  Diet heart healthy/carb modified Room service appropriate?: Yes; Fluid consistency:: Thin; Fluid restriction:: 1200 mL Fluid  EDUCATION NEEDS:  No education needs identified at this time   Intake/Output Summary (Last 24 hours) at 12/04/14 1031 Last data filed at 12/04/14 0857  Gross per 24 hour  Intake   1060 ml  Output   1555 ml  Net   -495 ml    Last BM:  PTA  Pryor Ochoa RD, LDN Inpatient Clinical Dietitian Pager: [phone] After Hours Pager: [phone]

## 2014-12-04 NOTE — Progress Notes (Signed)
Physical Therapy Treatment Patient Details Name: [name] MRN: [id number] DOB: [birth year] Today's Date: 12/04/2014    History of Present Illness Patient is a 79 yo male admitted 12/02/14 with dizziness and 2 falls.  Patient with orthostatic hypotension in ED.  PMH:  HTN, CAD, CABG, ICM, ICD, CHF, DM, arythmias, Mod/severe mitral regurgitation.    PT Comments    Mikki Santee is familiar from prior admissions and is demonstrating significantly different endurance and balance. Orthostatic hypotension maintained even after gait with increase in BP only after HEP after gait. Educated Engineer, agricultural for balance deficits and fall risk with need for assist/supervision for all mobility for safety. Will continue to follow to maximize function.   Follow Up Recommendations  Home health PT;Supervision/Assistance - 24 hour     Equipment Recommendations       Recommendations for Other Services       Precautions / Restrictions Precautions Precautions: Fall Precaution Comments: watch BP     Mobility  Bed Mobility         Supine to sit: Supervision     General bed mobility comments: increased time with tremors of RUE and overshooting bedrail  Transfers Overall transfer level: Needs assistance   Transfers: Sit to/from Stand Sit to Stand: Min guard         General transfer comment: guarding for safety and balance with drop in BP with transition to standing.   Ambulation/Gait Ambulation/Gait assistance: Min assist Ambulation Distance (Feet): 100 Feet Assistive device: 1 person hand held assist;Rolling walker (2 wheeled) Gait Pattern/deviations: Ataxic;Decreased stride length   Gait velocity interpretation: Below normal speed for age/gender General Gait Details: Pt with need for BM and walked to bathroom with min assist with ataxic gait and pt with min assist for balance and stability wth hand held assist and reaching out for environmental supoort. Pt able to walk 100' after that with RW with  improved safety and stability   Stairs            Wheelchair Mobility    Modified Rankin (Stroke Patients Only)       Balance                                    Cognition Arousal/Alertness: Awake/alert Behavior During Therapy: WFL for tasks assessed/performed Overall Cognitive Status: Within Functional Limits for tasks assessed                      Exercises General Exercises - Lower Extremity Long Arc Quad: AROM;Seated;Both;20 reps Hip Flexion/Marching: AROM;Seated;Both;20 reps    General Comments        Pertinent Vitals/Pain Pain Assessment: No/denies pain  Orthostatic BPs  Supine 109/40, HR 63  Sitting 101/59,  HR 64  Standing 93/40 HR70  Sitting after gait 93/41 HR 72  Sitting after exercises 133/47 HR 65  Pt asymptomatic throughout     Home Living                      Prior Function            PT Goals (current goals can now be found in the care plan section) Progress towards PT goals: Progressing toward goals    Frequency       PT Plan Current plan remains appropriate    Co-evaluation             End of Session  Activity Tolerance: Patient limited by fatigue Patient left: in chair;with call bell/phone within reach;with chair alarm set     Time: 1347-1419 PT Time Calculation (min) (ACUTE ONLY): 32 min  Charges:  $Gait Training: 8-22 mins $Therapeutic Activity: 8-22 mins                    G Codes:      Melford Aase 12/18/2014, 2:39 PM Elwyn Reach, [place]

## 2014-12-04 NOTE — Consult Note (Signed)
   [facility] CM Inpatient Consult   12/04/2014  [name] [birth year] [id number] Referral received to assess for care management services. Explained that [place] [facility] is a covered benefit of Medicare insurance.   Met with the patient and his wife, Mardene Celeste, regarding the benefits of [facility] services. Review information for [facility] and a folder was provided with contact information.  Explained that [facility] does not interfere with or replace any services arranged by the inpatient care management staff. Wife states, "we are moving and everything is on me. We are in the process of getting a new primary physician and all,  since we are moving to this area. She states she has spoken with Dr. Missy Sabins about their concerns with moving to this area.  Encouraged the wife to check out the [facility] website as we for primary care providers in the [place] [facility] network as well.   Patient declined services with [facility] at this time.  Encouraged wife to follow up, if the patient's needs changes.  She politely states, "this moving has really gotten me going here and there and right now I would like to just continue with [place], when he is discharged."   She was given a brochure with contact information and general information for [facility] Management.  For questions, please contact: Natividad Brood, RN BSN [facility] Liaison  [phone] business mobile phone

## 2014-12-05 ENCOUNTER — Inpatient Hospital Stay (HOSPITAL_COMMUNITY): Payer: Medicare Other

## 2014-12-05 DIAGNOSIS — I5022 Chronic systolic (congestive) heart failure: Secondary | ICD-10-CM

## 2014-12-05 DIAGNOSIS — R0600 Dyspnea, unspecified: Secondary | ICD-10-CM

## 2014-12-05 DIAGNOSIS — I255 Ischemic cardiomyopathy: Secondary | ICD-10-CM

## 2014-12-05 LAB — BASIC METABOLIC PANEL
Anion gap: 9 (ref 5–15)
BUN: 35 mg/dL — ABNORMAL HIGH (ref 6–20)
CHLORIDE: 93 mmol/L — AB (ref 101–111)
CO2: 29 mmol/L (ref 22–32)
Calcium: 8.9 mg/dL (ref 8.9–10.3)
Creatinine, Ser: 2.43 mg/dL — ABNORMAL HIGH (ref 0.61–1.24)
GFR calc Af Amer: 28 mL/min — ABNORMAL LOW (ref >60)
GFR calc non Af Amer: 24 mL/min — ABNORMAL LOW (ref >60)
GLUCOSE: 120 mg/dL — AB (ref 65–99)
Potassium: 4.2 mmol/L (ref 3.5–5.1)
SODIUM: 131 mmol/L — AB (ref 135–145)

## 2014-12-05 LAB — CBC
HEMATOCRIT: 29.3 % — AB (ref 39.0–52.0)
Hemoglobin: 9.8 g/dL — ABNORMAL LOW (ref 13.0–17.0)
MCH: 28 pg (ref 26.0–34.0)
MCHC: 33.4 g/dL (ref 30.0–36.0)
MCV: 83.7 fL (ref 78.0–100.0)
Platelets: 114 10*3/uL — ABNORMAL LOW (ref 150–400)
RBC: 3.5 MIL/uL — ABNORMAL LOW (ref 4.22–5.81)
RDW: 17.1 % — ABNORMAL HIGH (ref 11.5–15.5)
WBC: 8.7 10*3/uL (ref 4.0–10.5)

## 2014-12-05 LAB — GLUCOSE, CAPILLARY
GLUCOSE-CAPILLARY: 114 mg/dL — AB (ref 65–99)
Glucose-Capillary: 204 mg/dL — ABNORMAL HIGH (ref 65–99)

## 2014-12-05 MED ORDER — TORSEMIDE 20 MG PO TABS
20.0000 mg | ORAL_TABLET | Freq: Every day | ORAL | Status: DC
  Administered 2014-12-05: 20 mg via ORAL
  Filled 2014-12-05: qty 1

## 2014-12-05 MED ORDER — MAGNESIUM OXIDE 400 (241.3 MG) MG PO TABS: 400.0000 mg | ORAL_TABLET | Freq: Three times a day (TID) | ORAL | Status: DC

## 2014-12-05 MED ORDER — TORSEMIDE 20 MG PO TABS: 20.0000 mg | ORAL_TABLET | Freq: Every day | ORAL | Status: DC

## 2014-12-05 NOTE — Progress Notes (Signed)
  Echocardiogram 2D Echocardiogram has been performed.  [name] 12/05/2014, 2:38 PM

## 2014-12-05 NOTE — Progress Notes (Signed)
D/C instructions given. Teach back used. Await family transport.

## 2014-12-05 NOTE — Progress Notes (Signed)
Asked to review telemetry and device interrogation.  Pt with MDT single chamber ICD.  Device interrogated and functioning normally.  RV threshold trending up, but still well within range of normal. Threshold this admission 1.75V@0 .45msec. Impedence stable.   Telemetry reviewed which demonstrated SR. Histograms on device show normal HR distribution.  Will plan routine outpatient device follow up.  Please call with questions.  Chanetta Marshall, NP 12/05/2014 9:20 AM

## 2014-12-05 NOTE — Discharge Summary (Signed)
Physician Discharge Summary  [name] MRN: [id number] DOB/AGE: [birth year] 79 y.o.  PCP: Tammi Sou, MD   Admit date: 12/02/2014 Discharge date: 12/05/2014  Discharge Diagnoses:     Principal Problem:   Dizziness Active Problems:   Hypokalemia   Renal failure (ARF), acute on chronic Chronic Combined HF NYHA Class 3b, Ischemic Cardiomyopathy    Follow-up recommendations Follow-up with PCP in 3-5 days , including although additional recommended appointments as below Follow-up CBC, mg, CMP in 3-5 days      Medication List    STOP taking these medications        doxycycline 100 MG tablet  Commonly known as:  VIBRA-TABS     isosorbide mononitrate 30 MG 24 hr tablet  Commonly known as:  IMDUR     metolazone 2.5 MG tablet  Commonly known as:  ZAROXOLYN      TAKE these medications        amiodarone 200 MG tablet  Commonly known as:  PACERONE  Take 1 tablet (200 mg total) by mouth daily.     Fluticasone-Salmeterol 500-50 MCG/DOSE Aepb  Commonly known as:  ADVAIR  Inhale 1 puff into the lungs 2 (two) times daily.     folic acid 1 MG tablet  Commonly known as:  FOLVITE  Take 1 tablet (1 mg total) by mouth daily.     guaiFENesin 600 MG 12 hr tablet  Commonly known as:  MUCINEX  Take 1 tablet (600 mg total) by mouth 2 (two) times daily.     Insulin Glargine 100 UNIT/ML Solostar Pen  Commonly known as:  LANTUS SOLOSTAR  10 units SQ qhs     insulin lispro 100 UNIT/ML KiwkPen  Commonly known as:  HUMALOG KWIKPEN  Take 5 units in the morning, 5 units at noon, --if blood sugar over 100 and you eat > 50% of meals     levothyroxine 88 MCG tablet  Commonly known as:  SYNTHROID, LEVOTHROID  Take 1 tablet (88 mcg total) by mouth daily.     magnesium oxide 400 (241.3 MG) MG tablet  Commonly known as:  MAG-OX  Take 1 tablet (400 mg total) by mouth 3 (three) times daily.     pantoprazole 40 MG tablet  Commonly known as:  PROTONIX  Take 1 tablet (40 mg  total) by mouth daily.     potassium chloride SA 20 MEQ tablet  Commonly known as:  KLOR-CON M20  Take 1 tablet (20 mEq total) by mouth 2 (two) times daily.     pravastatin 80 MG tablet  Commonly known as:  PRAVACHOL  Take 1 tablet (80 mg total) by mouth at bedtime.     senna-docusate 8.6-50 MG per tablet  Commonly known as:  Senokot-S  Take 1 tablet by mouth at bedtime as needed for mild constipation.     thiamine 100 MG tablet  Commonly known as:  VITAMIN B-1  Take 1 tablet (100 mg total) by mouth daily.     tiotropium 18 MCG inhalation capsule  Commonly known as:  SPIRIVA HANDIHALER  PLACE ONE CAPSULE INTO INHALER AND  INHALE  DAILY     torsemide 20 MG tablet  Commonly known as:  DEMADEX  Take 1 tablet (20 mg total) by mouth daily.         Discharge Condition: *stable    Disposition: 01-Home or Self Care   Consults:  CHF team    Significant Diagnostic Studies:  Dg Chest 2 View  12/02/2014  CLINICAL DATA:  Productive cough.  Shortness of breath.  EXAM: CHEST  2 VIEW  COMPARISON:  November 22, 2014.  FINDINGS: Stable cardiomediastinal silhouette. Status post coronary artery bypass graft. Left-sided pacemaker is unchanged in position. Stable calcified pleural plaques are noted bilaterally. No acute pulmonary disease is noted. No pneumothorax or pleural effusion is noted.  IMPRESSION: Stable calcified pleural plaques. No acute cardiopulmonary abnormality seen.   Electronically Signed   By: Marijo Conception, M.D.   On: 12/02/2014 20:28   Dg Ribs Unilateral W/chest Left  11/22/2014   CLINICAL DATA:  Left lower rib pain for 2 weeks  EXAM: LEFT RIBS AND CHEST - 3+ VIEW  COMPARISON:  None.  FINDINGS: No fracture or other bone lesions are seen involving the ribs. There is no evidence of pneumothorax or pleural effusion. The lungs are hyperinflated likely secondary to COPD. There are bilateral calcified pleural plaques. There is evidence of prior CABG. There is a dual lead cardiac  pacer. Heart size and mediastinal contours are within normal limits.  IMPRESSION: No acute osseous injury of the left ribs.   Electronically Signed   By: Kathreen Devoid   On: 11/22/2014 12:37   US Paracentesis  11/28/2014   INDICATION: Symptomatic ascites.  EXAM: ULTRASOUND-GUIDED PARACENTESIS  COMPARISON:  None.  MEDICATIONS: 10 cc 1% lidocaine  COMPLICATIONS: None immediate  TECHNIQUE: Informed written consent was obtained from the patient after a discussion of the risks, benefits and alternatives to treatment. A timeout was performed prior to the initiation of the procedure.  Initial ultrasound scanning demonstrates a moderate amount of ascites within the right lower abdominal quadrant. The right lower abdomen was prepped and draped in the usual sterile fashion. 1% lidocaine with epinephrine was used for local anesthesia. Under direct ultrasound guidance, a 19 gauge, 7-cm, Yueh catheter was introduced. An ultrasound image was saved for documentation purposed. The paracentesis was performed. The catheter was removed and a dressing was applied. The patient tolerated the procedure well without immediate post procedural complication.  FINDINGS: A total of approximately 600 cc of yellow fluid was removed.  IMPRESSION: Successful ultrasound-guided paracentesis yielding 600 cc of peritoneal fluid.  Read by:  Lavonia Drafts [facility]   Electronically Signed   By: Sandi Mariscal M.D.   On: 11/28/2014 15:39      Filed Weights   12/03/14 0012 12/04/14 0510 12/05/14 0510  Weight: 60 kg (132 lb 4.4 oz) 60.419 kg (133 lb 3.2 oz) 59.4 kg (130 lb 15.3 oz)     Microbiology: Recent Results (from the past 240 hour(s))  MRSA PCR Screening     Status: None   Collection Time: 12/03/14 12:27 AM  Result Value Ref Range Status   MRSA by PCR NEGATIVE NEGATIVE Final    Comment:        The GeneXpert MRSA Assay (FDA approved for NASAL specimens only), is one component of a comprehensive MRSA colonization surveillance program. It  is not intended to diagnose MRSA infection nor to guide or monitor treatment for MRSA infections.        Blood Culture    Component Value Date/Time   SDES BLOOD LEFT ARM 11/03/2014 1938   SPECREQUEST BOTTLES DRAWN AEROBIC AND ANAEROBIC 5CC 11/03/2014 1938   CULT  11/03/2014 1938    NO GROWTH 5 DAYS Performed at [facility] 11/10/2014 FINAL 11/03/2014 1938      Labs: Results for orders placed or performed during the hospital encounter of 12/02/14 (  from the past 48 hour(s))  Glucose, capillary     Status: Abnormal   Collection Time: 12/03/14 11:11 AM  Result Value Ref Range   Glucose-Capillary 137 (H) 65 - 99 mg/dL   Comment 1 Notify RN   Troponin I (q 6hr x 3)     Status: Abnormal   Collection Time: 12/03/14 12:10 PM  Result Value Ref Range   Troponin I 0.09 (H) <0.031 ng/mL    Comment:        PERSISTENTLY INCREASED TROPONIN VALUES IN THE RANGE OF 0.04-0.49 ng/mL CAN BE SEEN IN:       -UNSTABLE ANGINA       -CONGESTIVE HEART FAILURE       -MYOCARDITIS       -CHEST TRAUMA       -ARRYHTHMIAS       -LATE PRESENTING MYOCARDIAL INFARCTION       -COPD   CLINICAL FOLLOW-UP RECOMMENDED.   Comprehensive metabolic panel     Status: Abnormal   Collection Time: 12/03/14 12:10 PM  Result Value Ref Range   Sodium 126 (L) 135 - 145 mmol/L   Potassium 2.8 (L) 3.5 - 5.1 mmol/L   Chloride 81 (L) 101 - 111 mmol/L   CO2 34 (H) 22 - 32 mmol/L   Glucose, Bld 135 (H) 65 - 99 mg/dL   BUN 36 (H) 6 - 20 mg/dL   Creatinine, Ser 2.54 (H) 0.61 - 1.24 mg/dL   Calcium 8.4 (L) 8.9 - 10.3 mg/dL   Total Protein 5.9 (L) 6.5 - 8.1 g/dL   Albumin 2.2 (L) 3.5 - 5.0 g/dL   AST 75 (H) 15 - 41 U/L   ALT 25 17 - 63 U/L   Alkaline Phosphatase 115 38 - 126 U/L   Total Bilirubin 1.5 (H) 0.3 - 1.2 mg/dL   GFR calc non Af Amer 23 (L) >60 mL/min   GFR calc Af Amer 26 (L) >60 mL/min    Comment: (NOTE) The eGFR has been calculated using the CKD EPI equation. This calculation  has not been validated in all clinical situations. eGFR's persistently <60 mL/min signify possible Chronic Kidney Disease.    Anion gap 11 5 - 15  Magnesium     Status: None   Collection Time: 12/03/14 12:10 PM  Result Value Ref Range   Magnesium 1.7 1.7 - 2.4 mg/dL  Glucose, capillary     Status: Abnormal   Collection Time: 12/03/14  4:10 PM  Result Value Ref Range   Glucose-Capillary 116 (H) 65 - 99 mg/dL   Comment 1 Notify RN   Glucose, capillary     Status: Abnormal   Collection Time: 12/03/14 10:09 PM  Result Value Ref Range   Glucose-Capillary 135 (H) 65 - 99 mg/dL   Comment 1 Notify RN    Comment 2 Document in Chart   Comprehensive metabolic panel     Status: Abnormal   Collection Time: 12/04/14  3:36 AM  Result Value Ref Range   Sodium 127 (L) 135 - 145 mmol/L   Potassium 4.5 3.5 - 5.1 mmol/L    Comment: DELTA CHECK NOTED   Chloride 86 (L) 101 - 111 mmol/L   CO2 32 22 - 32 mmol/L   Glucose, Bld 124 (H) 65 - 99 mg/dL   BUN 35 (H) 6 - 20 mg/dL   Creatinine, Ser 2.57 (H) 0.61 - 1.24 mg/dL   Calcium 8.5 (L) 8.9 - 10.3 mg/dL   Total Protein 5.6 (L) 6.5 - 8.1  g/dL   Albumin 2.2 (L) 3.5 - 5.0 g/dL   AST 65 (H) 15 - 41 U/L   ALT 24 17 - 63 U/L   Alkaline Phosphatase 106 38 - 126 U/L   Total Bilirubin 1.6 (H) 0.3 - 1.2 mg/dL   GFR calc non Af Amer 23 (L) >60 mL/min   GFR calc Af Amer 26 (L) >60 mL/min    Comment: (NOTE) The eGFR has been calculated using the CKD EPI equation. This calculation has not been validated in all clinical situations. eGFR's persistently <60 mL/min signify possible Chronic Kidney Disease.    Anion gap 9 5 - 15  CBC     Status: Abnormal   Collection Time: 12/04/14  3:36 AM  Result Value Ref Range   WBC 8.3 4.0 - 10.5 K/uL   RBC 3.44 (L) 4.22 - 5.81 MIL/uL   Hemoglobin 9.4 (L) 13.0 - 17.0 g/dL   HCT 28.2 (L) 39.0 - 52.0 %   MCV 82.0 78.0 - 100.0 fL   MCH 27.3 26.0 - 34.0 pg   MCHC 33.3 30.0 - 36.0 g/dL   RDW 16.7 (H) 11.5 - 15.5 %    Platelets 115 (L) 150 - 400 K/uL    Comment: CONSISTENT WITH PREVIOUS RESULT  Magnesium     Status: None   Collection Time: 12/04/14  3:36 AM  Result Value Ref Range   Magnesium 1.7 1.7 - 2.4 mg/dL  TSH     Status: None   Collection Time: 12/04/14  3:36 AM  Result Value Ref Range   TSH 3.868 0.350 - 4.500 uIU/mL  Glucose, capillary     Status: Abnormal   Collection Time: 12/04/14  5:48 AM  Result Value Ref Range   Glucose-Capillary 129 (H) 65 - 99 mg/dL   Comment 1 Notify RN    Comment 2 Document in Chart   Glucose, capillary     Status: Abnormal   Collection Time: 12/04/14 12:18 PM  Result Value Ref Range   Glucose-Capillary 167 (H) 65 - 99 mg/dL  Glucose, capillary     Status: Abnormal   Collection Time: 12/04/14  4:37 PM  Result Value Ref Range   Glucose-Capillary 120 (H) 65 - 99 mg/dL   Comment 1 Notify RN   Glucose, capillary     Status: Abnormal   Collection Time: 12/04/14  8:51 PM  Result Value Ref Range   Glucose-Capillary 132 (H) 65 - 99 mg/dL  Basic metabolic panel     Status: Abnormal   Collection Time: 12/05/14  3:25 AM  Result Value Ref Range   Sodium 131 (L) 135 - 145 mmol/L   Potassium 4.2 3.5 - 5.1 mmol/L   Chloride 93 (L) 101 - 111 mmol/L   CO2 29 22 - 32 mmol/L   Glucose, Bld 120 (H) 65 - 99 mg/dL   BUN 35 (H) 6 - 20 mg/dL   Creatinine, Ser 2.43 (H) 0.61 - 1.24 mg/dL   Calcium 8.9 8.9 - 10.3 mg/dL   GFR calc non Af Amer 24 (L) >60 mL/min   GFR calc Af Amer 28 (L) >60 mL/min    Comment: (NOTE) The eGFR has been calculated using the CKD EPI equation. This calculation has not been validated in all clinical situations. eGFR's persistently <60 mL/min signify possible Chronic Kidney Disease.    Anion gap 9 5 - 15  CBC     Status: Abnormal   Collection Time: 12/05/14  3:25 AM  Result Value Ref Range  WBC 8.7 4.0 - 10.5 K/uL   RBC 3.50 (L) 4.22 - 5.81 MIL/uL   Hemoglobin 9.8 (L) 13.0 - 17.0 g/dL   HCT 29.3 (L) 39.0 - 52.0 %   MCV 83.7 78.0 - 100.0 fL    MCH 28.0 26.0 - 34.0 pg   MCHC 33.4 30.0 - 36.0 g/dL   RDW 17.1 (H) 11.5 - 15.5 %   Platelets 114 (L) 150 - 400 K/uL    Comment: CONSISTENT WITH PREVIOUS RESULT  Glucose, capillary     Status: Abnormal   Collection Time: 12/05/14  6:21 AM  Result Value Ref Range   Glucose-Capillary 114 (H) 65 - 99 mg/dL     Lipid Panel     Component Value Date/Time   CHOL 83 02/01/2014 0430   TRIG 102 02/01/2014 0430   TRIG 164* 03/16/2006 0913   HDL 10* 02/01/2014 0430   CHOLHDL 8.3 02/01/2014 0430   CHOLHDL 6.4 CALC 03/16/2006 0913   VLDL 20 02/01/2014 0430   LDLCALC 53 02/01/2014 0430   LDLDIRECT 64.7 12/31/2007 0845     Lab Results  Component Value Date   HGBA1C 5.1 11/04/2014   HGBA1C 5.3 09/09/2014   HGBA1C 6.0* 08/31/2014     Lab Results  Component Value Date   MICROALBUR 0.9 03/13/2014   LDLCALC 53 02/01/2014   CREATININE 2.43* 12/05/2014     HPI :[name] is a 79 y.o. year old male with a history of HTN, CAD s/p CABG, ICM s/p [employer] ICD, chronic systolic heart failure, DM2, Vtach, atrial flutter, PVD and GI mass. ECHO (12/22/13) EF 20-25%, EF 40% by echo 12/15 with Mod-severe MR.   01/2014 admitted with volume overload and low output CHF. Discharge weight 151 pounds, creatinine 1.42.  April 2016, admitted twice with CHF and cardiorenal syndrome requiring milrinone. He also had an GI bleed at that time and had candidal esophagitis with 2 colonic AVMs. Eliquis was stopped due to recurrent bleeding. Later in April, he had pneumonia. Discharge weight 147.  He has been hyponatremic in the past and started on sodium chloride tablets.   11/20/2014, he was seen in the office by Dr. Haroldine Laws on . His weight at that time was 146 pounds. His volume status was stable. It was felt that he would benefit from a cardiomems device but this has not happened yet. His MR is to be followed closely and if his LV begins dilating, TEE will then be performed. His operative all  was checked and was okay and his ICD was interrogated and it was also okay. His sodium at that time was 123 with a chloride of 84, potassium of 3.5, BUN 35 and creatinine 2.17.   11/28/2014, he had paracentesis with 600 mL removed.  11/29/2014, his weight was down to 136 pounds, he had multiple skin tears on his arms and 2-3 plus lower extremity edema. He was orthostatic with a systolic blood pressure dropping from 110 sitting to 70 with standing. He was advised to stop Imdur and hydralazine.  12/02/2014, his wife called stating that he was falling and dizzy and he was advised to bring him to the hospital. Later that evening she did so. Admission labs showed a sodium of 128, potassium 2.5, BUN 41, creatinine 2.78 and chloride 79. Today's labs are below. He was significantly orthostatic on admission with S/P be decreasing from 125 down to 65 going from lying to standing. His current weight is 132 pounds.  His wife states that Mr. Skoda has  been eating very poorly. He has had no chest pain and feels that his shortness of breath is at baseline. He has no appetite. He has been urinating very frequently and she states he is putting out large amounts. He denies any cramping pains, tremors or any kind of jerking. He has had no palpitations. Dr. Donney Rankins is ordering albumen in the hopes of correcting his albumen and hopefully this will improve his volume status. He is to get potassium totaling 200 mEq today. He has already had 80 mEq. He also got 2 g of IV magnesium. He feels weak but otherwise feels okay.    HOSPITAL COURSE: \   1. Orthostatic hypotension orthostatic dizziness vs arrythmia . Steadily declining . On torsemide 20 bid + metolazone biw at home. Hydralazine was stopped early last week with low BP, Imdur continued. s/p fluid boluses upto 1L Hold hydralazine/Imdur.  Hold metolazone for now and decrease torsemide to 20 mg daily with KCl 20 daily starting 7/12 per cardiology . -  S/p  12.5 g iv albumin   2. Severe hypokalemia , hyponatremia , hypomagnesemia , repleted , sodium low due to chf,, diuretics , continue fluid restriction   3. Chronic systolic CHF: EF 08-65% with moderate-severe MR. Functional decline this year.  - As above, will leave off hydralazine/Imdur.  Cardiology to address CODE STATUS and discuss prognosis with family  Out 2 L this admit. Down 13 lbs since discharge 1 month ago, per cards stable for DC    3. Bradycardia: ICD interrogation showed episode of HR < 30 without pacer spikes, per  EP and Dr Aundra Dubin no further inpatient work up needed per cards . Currently appears to be NSR in 50s-60s. OK to DC   4. Atrial fibrillation: Paroxysmal. Continue amiodarone. Not anticoagulated with fall risk (frequent falls) and h/o bleeding from AVMs. off eliquis   5. CAD: s/p CABG. Mild increase in troponin with no trend. Suspect demand ischemia.   6. COPD, severe, cont dulera, spiriva   7. Type II diabetes mellitus, restart lantus, SSI   8. Chronic blood loss anemia Transfused 2 U pRBCs April, May, and June (10/26/14) of 2016. GI w/u has revealed only 2 tiny non-bleeding AVMs in mid small bowel.  9. Hypothyroidism ,cont synthroid   10. CKD ,stage 4,, likely cardiorenal, follow    Discharge Exam:    Blood pressure 115/40, pulse 63, temperature 97.5 F (36.4 C), temperature source Oral, resp. rate 18, height 5' 7"  (1.702 m), weight 59.4 kg (130 lb 15.3 oz), SpO2 100 %.  General: Pleasant, NAD. Sitting on edge of bed. Neuro: Alert and oriented X 3. Moves all extremities spontaneously. Psych: Normal affect. HEENT: Normal Neck: Supple without bruits. JVP minimal but patient sitting straight up. Lungs: Resp regular and unlabored, CTA bilaterally. Heart: RRR. Distant heart sounds. 2/6 HSM at apex.  Abdomen: Soft, non-tender, non-distended, BS + x 4.  Extremities: No clubbing, cyanosis. DP/PT/Radials 2+ and equal bilaterally. No  edema. SCDs in place.       Discharge Instructions    Diet - low sodium heart healthy    Complete by:  As directed      Increase activity slowly    Complete by:  As directed            Follow-up Information    Follow up with [facility] On 12/12/2014.   Specialty:  Cardiology   Why:  @ 1030 for post hospital follow up.  Please  bring all of your medications with you to your appointment.  The code for patient parking at the heart center is 8000 for July   Contact information:   [address] [id number] [place] [phone]      Follow up with Virl Axe, MD On 12/27/2014.   Specialty:  Cardiology   Why:  at 12:30PM   Contact information:   [address] [place] [address] [phone]       Follow up with Tammi Sou, MD. Schedule an appointment as soon as possible for a visit in 3 days.   Specialty:  Family Medicine   Why:  check cmp , magnesium   Contact information:   [address] [place] [address] [phone]       Signed: Reyne Dumas 12/05/2014, 9:58 AM        Time spent >45 mins

## 2014-12-05 NOTE — Progress Notes (Signed)
Patient is active with [facility] as prior to admission; Butch Penny with [facility] made aware of discharge home today; Aneta Mins [phone]

## 2014-12-05 NOTE — Progress Notes (Addendum)
Advanced Heart Failure Rounding Note  PCP: Tammi Sou, MD Primary Cardiologist: Dr. Haroldine Laws  Subjective:     Overall says he is feeling good today.  Denies any dizziness even when working with PT yesterday.  Does state that his BP got low with standing with PT. Denies any SOB or weakness currently. Out 0.4 L yesterday down 2 lbs since admission.    Objective:   Weight Range: 130 lb 15.3 oz (59.4 kg) Body mass index is 20.51 kg/(m^2).   Vital Signs:   Temp:  [97.3 F (36.3 C)-98 F (36.7 C)] 97.3 F (36.3 C) (07/12 0510) Pulse Rate:  [57-60] 57 (07/12 0510) Resp:  [18] 18 (07/12 0510) BP: (104-112)/(43-47) 104/47 mmHg (07/12 0510) SpO2:  [97 %-100 %] 98 % (07/12 0510) Weight:  [130 lb 15.3 oz (59.4 kg)] 130 lb 15.3 oz (59.4 kg) (07/12 0510)    Weight change: Filed Weights   12/03/14 0012 12/04/14 0510 12/05/14 0510  Weight: 132 lb 4.4 oz (60 kg) 133 lb 3.2 oz (60.419 kg) 130 lb 15.3 oz (59.4 kg)    Intake/Output:   Intake/Output Summary (Last 24 hours) at 12/05/14 0752 Last data filed at 12/05/14 0313  Gross per 24 hour  Intake   1000 ml  Output   1475 ml  Net   -475 ml     Physical Exam: General: Pleasant, NAD. Sitting on edge of bed. Neuro: Alert and oriented X 3. Moves all extremities spontaneously. Psych: Normal affect. HEENT: Normal Neck: Supple without bruits.  JVP minimal but patient sitting straight up. Lungs: Resp regular and unlabored, CTA bilaterally. Heart: RRR. Distant heart sounds. 2/6 HSM at apex.  Abdomen: Soft, non-tender, non-distended, BS + x 4.  Extremities: No clubbing, cyanosis. DP/PT/Radials 2+ and equal bilaterally. No edema. SCDs in place.   Telemetry:  Sinus brady in 50s.   Labs: CBC  Recent Labs  12/03/14 0730 12/04/14 0336 12/05/14 0325  WBC 8.6 8.3 8.7  NEUTROABS 5.8  --   --   HGB 10.3* 9.4* 9.8*  HCT 29.5* 28.2* 29.3*  MCV 82.6 82.0 83.7  PLT 107* 115* 784*   Basic Metabolic Panel  Recent Labs  12/03/14 1210 12/04/14 0336 12/05/14 0325  NA 126* 127* 131*  K 2.8* 4.5 4.2  CL 81* 86* 93*  CO2 34* 32 29  GLUCOSE 135* 124* 120*  BUN 36* 35* 35*  CALCIUM 8.4* 8.5* 8.9  MG 1.7 1.7  --    Liver Function Tests  Recent Labs  12/03/14 1210 12/04/14 0336  AST 75* 65*  ALT 25 24  ALKPHOS 115 106  BILITOT 1.5* 1.6*  PROT 5.9* 5.6*  ALBUMIN 2.2* 2.2*   No results for input(s): LIPASE, AMYLASE in the last 72 hours. Cardiac Enzymes  Recent Labs  12/03/14 0120 12/03/14 0730 12/03/14 1210  TROPONINI 0.10* 0.10* 0.09*    BNP: BNP (last 3 results)  Recent Labs  09/09/14 1150 10/09/14 2237 12/02/14 1800  BNP 550.7* 715.8* 262.1*    ProBNP (last 3 results)  Recent Labs  01/24/14 1909 02/09/14 0945  PROBNP 6721.0* 2555.0*     D-Dimer No results for input(s): DDIMER in the last 72 hours. Hemoglobin A1C No results for input(s): HGBA1C in the last 72 hours. Fasting Lipid Panel No results for input(s): CHOL, HDL, LDLCALC, TRIG, CHOLHDL, LDLDIRECT in the last 72 hours. Thyroid Function Tests  Recent Labs  12/04/14 0336  TSH 3.868    Other results:     Imaging/Studies:  No  results found.  Latest Echo  Latest Cath   Medications:     Scheduled Medications: . amiodarone  200 mg Oral Daily  . feeding supplement (GLUCERNA SHAKE)  237 mL Oral BID BM  . folic acid  1 mg Oral Daily  . guaiFENesin  600 mg Oral BID  . insulin aspart  0-9 Units Subcutaneous TID WC  . insulin glargine  5 Units Subcutaneous Daily  . levothyroxine  88 mcg Oral QAC breakfast  . magnesium oxide  400 mg Oral TID  . mometasone-formoterol  2 puff Inhalation BID  . multivitamin with minerals  1 tablet Oral Daily  . pantoprazole  40 mg Oral Daily  . pravastatin  80 mg Oral QHS  . sodium chloride  3 mL Intravenous Q12H  . tiotropium  18 mcg Inhalation QHS    Infusions:    PRN Medications: acetaminophen **OR** acetaminophen, ondansetron **OR** ondansetron (ZOFRAN)  IV   Assessment/Plan   1. Dizziness - Prompted admission - ? Volume and possible symptomatic bradycardia - Medtronic ICD interrogated 12/03/14 2. Chronic Combined HF NYHA Class 3b, Ischemic Cardiomyopathy - Echo 08/31/14 showed 40-45% with grade II 2 DD - Small amount of fluid on board, but no SOB currently. - Out 2 L this admit.  Down 13 lbs since discharge 1 month ago - HTN meds being held currently with orthostasis on arrival, will resume as able. - No ACE/ARB in setting of CKD. - No BB with bradycardia - SBPs still soft in 100s.  Will monitor closely for resumption of meds as able/necessary. - Will consult MD for optimal timing of resumption of torsemide at decreased dose (daily down from BID) with K supp. 3. AKI on CKD, Stage 4 - Cr 2.43 relatively stable, down from yesterday. - Baseline Cr appears to be around 2-2.1 recently.  4. Chronic A fib - Medtronic ICD - interrogation showed episode of HR < 30 with no visible spikes, RV threshold trending up.   - EP asked to comment on best management of this.  - Rate and rhythm controlled currently with ICD in place - On Amiodarone 200 mg daily - Not on anticoag 2/2 falls and recurrent bleeding on eliquis - This patients CHA2DS2-VASc Score and unadjusted Ischemic Stroke Rate is 9.7 % stroke rate/year from a score of 6  5. Hypokalemia - 4.2 today - Continue to supp as necessary and follow closely  Length of Stay: 3   [name] PA-C 12/05/2014, 7:52 AM  Advanced Heart Failure Team  Patient seen with PA, agree with the above note.  1. Orthostatic hypotension: Patient was admitted with orthostatic dizziness. On review of notes, he has been declining in terms of health fairly steadily over the last few months. Not eating well. Has been on torsemide 20 bid + metolazone biw at home. Hydralazine was stopped early last week with low BP, Imdur continued. Patient feels better here. - Leave off hydralazine/Imdur.  - He will  need some diuresis, will likely need to cut back though. Hold metolazone for now and decrease torsemide to 20 mg daily with KCl 20 daily starting today.   - Continue to work with PT.  2. Chronic systolic CHF: EF 69-67% with moderate-severe MR. Functional decline this year.  - As above, will leave off hydralazine/Imdur.  - Start back lower dose diuretics today.  3. Bradycardia: ICD interrogation showed episode of HR < 30 without pacer spikes, RV threshold increasing. Will have EP evaluate.   4. Atrial fibrillation: Paroxysmal. Continue  amiodarone. Not anticoagulated with fall risk (frequent falls) and h/o bleeding from AVMs.  5. CAD: s/p CABG. Mild increase in troponin with no trend. Suspect demand ischemia.  6. CKD: Creatinine coming down.  As above, restarting lower dose torsemide today.  7. Very weak, needs to work with PT.   [name] 12/05/2014 8:39 AM

## 2014-12-06 ENCOUNTER — Other Ambulatory Visit: Payer: Self-pay | Admitting: Physical Medicine and Rehabilitation

## 2014-12-06 ENCOUNTER — Ambulatory Visit: Payer: Medicare Other | Admitting: Family Medicine

## 2014-12-06 ENCOUNTER — Encounter: Payer: Self-pay | Admitting: Internal Medicine

## 2014-12-06 ENCOUNTER — Other Ambulatory Visit: Payer: Self-pay | Admitting: *Deleted

## 2014-12-07 ENCOUNTER — Encounter (HOSPITAL_COMMUNITY): Payer: Self-pay | Admitting: Family Medicine

## 2014-12-07 ENCOUNTER — Encounter: Payer: Self-pay | Admitting: Family Medicine

## 2014-12-07 ENCOUNTER — Ambulatory Visit (INDEPENDENT_AMBULATORY_CARE_PROVIDER_SITE_OTHER): Payer: Medicare Other | Admitting: Family Medicine

## 2014-12-07 VITALS — BP 135/66 | HR 66 | Temp 97.6°F | Resp 16 | Ht 67.0 in | Wt 130.0 lb

## 2014-12-07 DIAGNOSIS — I480 Paroxysmal atrial fibrillation: Secondary | ICD-10-CM

## 2014-12-07 DIAGNOSIS — D500 Iron deficiency anemia secondary to blood loss (chronic): Secondary | ICD-10-CM | POA: Diagnosis not present

## 2014-12-07 DIAGNOSIS — E119 Type 2 diabetes mellitus without complications: Secondary | ICD-10-CM

## 2014-12-07 DIAGNOSIS — I255 Ischemic cardiomyopathy: Secondary | ICD-10-CM | POA: Diagnosis not present

## 2014-12-07 DIAGNOSIS — N189 Chronic kidney disease, unspecified: Secondary | ICD-10-CM | POA: Diagnosis not present

## 2014-12-07 DIAGNOSIS — N179 Acute kidney failure, unspecified: Secondary | ICD-10-CM

## 2014-12-07 DIAGNOSIS — I5022 Chronic systolic (congestive) heart failure: Secondary | ICD-10-CM

## 2014-12-07 LAB — CBC WITH DIFFERENTIAL/PLATELET
BASOS PCT: 0.1 % (ref 0.0–3.0)
Basophils Absolute: 0 10*3/uL (ref 0.0–0.1)
Eosinophils Absolute: 0 10*3/uL (ref 0.0–0.7)
Eosinophils Relative: 0.2 % (ref 0.0–5.0)
HCT: 32.3 % — ABNORMAL LOW (ref 39.0–52.0)
HEMOGLOBIN: 10.7 g/dL — AB (ref 13.0–17.0)
LYMPHS ABS: 1.6 10*3/uL (ref 0.7–4.0)
Lymphocytes Relative: 15.5 % (ref 12.0–46.0)
MCHC: 33.2 g/dL (ref 30.0–36.0)
MCV: 86.5 fl (ref 78.0–100.0)
MONO ABS: 1.3 10*3/uL — AB (ref 0.1–1.0)
MONOS PCT: 12.3 % — AB (ref 3.0–12.0)
Neutro Abs: 7.6 10*3/uL (ref 1.4–7.7)
Neutrophils Relative %: 71.9 % (ref 43.0–77.0)
Platelets: 136 10*3/uL — ABNORMAL LOW (ref 150.0–400.0)
RBC: 3.73 Mil/uL — AB (ref 4.22–5.81)
RDW: 18 % — ABNORMAL HIGH (ref 11.5–15.5)
WBC: 10.6 10*3/uL — AB (ref 4.0–10.5)

## 2014-12-07 LAB — COMPREHENSIVE METABOLIC PANEL
ALBUMIN: 2.7 g/dL — AB (ref 3.5–5.2)
ALT: 23 U/L (ref 0–53)
AST: 59 U/L — ABNORMAL HIGH (ref 0–37)
Alkaline Phosphatase: 133 U/L — ABNORMAL HIGH (ref 39–117)
BUN: 44 mg/dL — ABNORMAL HIGH (ref 6–23)
CALCIUM: 9.1 mg/dL (ref 8.4–10.5)
CO2: 32 mEq/L (ref 19–32)
Chloride: 94 mEq/L — ABNORMAL LOW (ref 96–112)
Creatinine, Ser: 2.39 mg/dL — ABNORMAL HIGH (ref 0.40–1.50)
GFR: 28.15 mL/min — ABNORMAL LOW (ref >60.00)
GLUCOSE: 89 mg/dL (ref 70–99)
Potassium: 4.1 mEq/L (ref 3.5–5.1)
SODIUM: 133 meq/L — AB (ref 135–145)
Total Bilirubin: 1.5 mg/dL — ABNORMAL HIGH (ref 0.2–1.2)
Total Protein: 6 g/dL (ref 6.0–8.3)

## 2014-12-07 LAB — MAGNESIUM: Magnesium: 1.6 mg/dL (ref 1.5–2.5)

## 2014-12-07 NOTE — Progress Notes (Signed)
OFFICE VISIT  12/07/2014   CC:  Chief Complaint  Patient presents with  . Hospitalization Follow-up   HPI:    Patient is a 79 y.o. Caucasian male who presents for hospital f/u: admitted 7/9-7/12, 2016 for dizziness.  Found to have hypokalemia, acute on chronic renal failure, and chronic combined HF (NYHA class 3b)--ischemic cardiomyopathy (needs CBC and CMP). His hydralazine and imdur have been stopped due to hypotension/dizziness.    His metolazone was d/c'd at discharge and he was continued on only 20mg  qd of demadex.  He was also given IV albumin in hosp.  He was d/c'd home on potassium and magnesium supplements.  No new complaints. Feels like breathing is good.  Can lie supine w/out being SOB.  Denies chest pain or abd pain or abd distention. A few days prior to most recent hospitalization he had paracentesis and he says only 0.6 L was removed. Due to chronic back pain he asks if he can go to chiropracter and I said I think this is fine.  Reviewed insulin regimen, sugars have been excellent, no hypoglycemia.  Recent A1c in hosp excellent (see lab section below).  Past Medical History  Diagnosis Date  . VENTRICULAR TACHYCARDIA   . PERIPHERAL VASCULAR DISEASE   . HYPERTENSION     Hypertensive retinopathy-grade II OU  . HYPERCHOLESTEROLEMIA   . CORONARY ARTERY DISEASE     Hx of CABG  . Chronic systolic heart failure     Ischemic cardiomyopathy (01/2014 EF 40-45% with hypokinesis + small area of akinesis; 08/2014, Echo with EF 40-45 percent, PAS 78.  11/2014 ECHO EF 25-30%, inferior akinesis, dilated LV, moderate mitral regurg.  . SEBORRHEIC DERMATITIS   . Chronic renal insufficiency, stage III (moderate)     CrCl 40s-50s  . DIVERTICULOSIS OF COLON   . COPD, severe   . COLONIC POLYPS   . ANXIETY   . ICD (implantable cardiac defibrillator) in place   . Type II diabetes mellitus     +background diabetic retinopathy OU  . Chronic lower back pain   . DJD (degenerative joint  disease)     "hands" (05/24/2012)  . Osteoarthritis of finger   . History of atrial flutter   . Thrombus 12/2013    on ICD lead--started anticoag and his cardioversion for atrial flutter was postponed.  . Colon stricture 2015    with features worrisome for mass, also with recent rising CEA level (possible colon cancer)-GI MD= Dr. Jackelyn Hoehn has been declining colonoscopy since that time.  Dr. Liliane Channel impression on re-eval in 2015 was that the area represented chronic post-diverticulitis changes, not malignancy.  . Macular degeneration, age related, nonexudative     OU  . Chronic blood loss anemia     Transfused 2 U pRBCs April, May, and June (10/26/14) of 2016.  GI w/u has revealed only 2 tiny non-bleeding AVMs in mid small bowel.    Past Surgical History  Procedure Laterality Date  . Coronary artery bypass graft  1990's    CABG X4  . Tonsillectomy  ?1942  . Cardiac defibrillator placement  12/2004    single chamber defibrillator- Medtronic Maxima Dr Caryl Comes   . Tee without cardioversion N/A 12/22/2013    Procedure: TRANSESOPHAGEAL ECHOCARDIOGRAM (TEE);  Surgeon: Thayer Headings, MD;  Location: [facility];  Service: Cardiovascular;  Laterality: N/A;  . Cardioversion N/A 12/22/2013    Procedure: CARDIOVERSION;  Surgeon: Thayer Headings, MD;  Location: [facility];  Service: Cardiovascular;  Laterality: N/A;  . Cardiovascular  stress test  01/12/13    myocard perf imaging: previous infarct with a moderately reduced EF as was known previously.  No new findings.   . Gastric emptying scan  02/02/14    Normal  . Transthoracic echocardiogram  01/25/14; 04/2014;11/2014    EF 40-45%, diffuse hypokinesis, infero-basilar myocardial akinesis, PA pressure slightly high, valves fine.04/2014-Echo today EF ~40% inferior AK moderate to severe MR. 2016 echo showed EF 25-30%, inferior akinesis, dilated LV, moderate mitral regurg  . Abdominal ultrasound  01/2014    GB sludge, o/w normal  . Ct virtual colonoscopy  diagnostic  11/2012; repeat 09/2014    Asymmetric thickening of the rectosigmoid colon worrisome for colon cancer, but further eval by pt's GI MD led to conclusion that this was likely sequela of recurrent diverticulitis.  Pt has refused colonoscopy to evaluate this region.  . Cardiovascular stress test      Lexiscan: There is a medium sized fixed inferior wall defect of moderate severity involving the apical inferior and mid inferior and basal inferoseptal segments, consistent with old inferior MI. No significant reversible ischemia.  EF 34%, inferior wall hypokinesis--intermediate risk scan (ok to do planned procedure)  . Implantable cardioverter defibrillator revision N/A 05/25/2012    Procedure: IMPLANTABLE CARDIOVERTER DEFIBRILLATOR REVISION;  Surgeon: Evans Lance, MD;  Location: [facility] CATH LAB;  Service: Cardiovascular;  Laterality: N/A;  . Right heart catheterization N/A 08/31/2014    Procedure: RIGHT HEART CATH;  Surgeon: Larey Dresser, MD;  Location: [facility] CATH LAB;  Service: Cardiovascular;  Laterality: N/A;  . Esophagogastroduodenoscopy N/A 09/03/2014    Candida esophagitis + retained gastric contents.  No bleeding. Procedure: ESOPHAGOGASTRODUODENOSCOPY (EGD);  Surgeon: Carol Ada, MD;  Location: [facility] ENDOSCOPY;  Service: Endoscopy;  Laterality: N/A;  . Givens capsule study N/A 10/12/2014    Procedure: GIVENS CAPSULE STUDY;  Surgeon: Irene Shipper, MD;  Location: [facility];  Service: Endoscopy;  Laterality: N/A;    Outpatient Prescriptions Prior to Visit  Medication Sig Dispense Refill  . amiodarone (PACERONE) 200 MG tablet Take 1 tablet (200 mg total) by mouth daily. 30 tablet 0  . Fluticasone-Salmeterol (ADVAIR) 500-50 MCG/DOSE AEPB Inhale 1 puff into the lungs 2 (two) times daily. 60 each 11  . folic acid (FOLVITE) 1 MG tablet Take 1 tablet (1 mg total) by mouth daily. 30 tablet 3  . guaiFENesin (MUCINEX) 600 MG 12 hr tablet Take 1 tablet (600 mg total) by mouth 2 (two) times daily. 30  tablet 0  . Insulin Glargine (LANTUS SOLOSTAR) 100 UNIT/ML Solostar Pen 10 units SQ qhs (Patient taking differently: Inject 10 Units into the skin at bedtime. ) 15 mL 11  . insulin lispro (HUMALOG KWIKPEN) 100 UNIT/ML KiwkPen Take 5 units in the morning, 5 units at noon, --if blood sugar over 100 and you eat > 50% of meals (Patient taking differently: Inject 7-9 Units into the skin 3 (three) times daily. Take 7 units in the morning, 8 units at noon, and 9 units at dinner) 5 pen 3  . levothyroxine (SYNTHROID, LEVOTHROID) 88 MCG tablet Take 1 tablet (88 mcg total) by mouth daily. 30 tablet 2  . magnesium oxide (MAG-OX) 400 (241.3 MG) MG tablet Take 1 tablet (400 mg total) by mouth 3 (three) times daily. 90 tablet 0  . pantoprazole (PROTONIX) 40 MG tablet Take 1 tablet (40 mg total) by mouth daily. 30 tablet 1  . potassium chloride SA (KLOR-CON M20) 20 MEQ tablet Take 1 tablet (20 mEq total) by mouth  2 (two) times daily. 60 tablet 1  . pravastatin (PRAVACHOL) 80 MG tablet Take 1 tablet (80 mg total) by mouth at bedtime. 30 tablet 0  . senna-docusate (SENOKOT-S) 8.6-50 MG per tablet Take 1 tablet by mouth at bedtime as needed for mild constipation. 100 tablet 1  . thiamine (VITAMIN B-1) 100 MG tablet Take 1 tablet (100 mg total) by mouth daily. 30 tablet 3  . tiotropium (SPIRIVA HANDIHALER) 18 MCG inhalation capsule PLACE ONE CAPSULE INTO INHALER AND  INHALE  DAILY (Patient taking differently: Place 18 mcg into inhaler and inhale at bedtime. ) 30 capsule 0  . torsemide (DEMADEX) 20 MG tablet Take 1 tablet (20 mg total) by mouth daily. 30 tablet 1   No facility-administered medications prior to visit.    Allergies  Allergen Reactions  . Niacin Itching    Niaspan     ROS As per HPI  PE: Blood pressure 135/66, pulse 66, temperature 97.6 F (36.4 C), temperature source Oral, resp. rate 16, height 5\' 7"  (1.702 m), weight 130 lb (58.968 kg), SpO2 100 %.Wt down 16 lb in the last 2 wks. Gen: Alert,  well appearing.  Patient is oriented to person, place, time, and situation. No pallor or jaundice. CV: RRR, no m/r/g.   LUNGS: CTA bilat, nonlabored resps, good aeration in all lung fields. ABD: soft, NT/ND EXT: no clubbing, cyanosis, or edema.   LABS:  Lab Results  Component Value Date   HGBA1C 5.1 11/04/2014      Chemistry      Component Value Date/Time   NA 131* 12/05/2014 0325   K 4.2 12/05/2014 0325   CL 93* 12/05/2014 0325   CO2 29 12/05/2014 0325   BUN 35* 12/05/2014 0325   CREATININE 2.43* 12/05/2014 0325      Component Value Date/Time   CALCIUM 8.9 12/05/2014 0325   ALKPHOS 106 12/04/2014 0336   AST 65* 12/04/2014 0336   ALT 24 12/04/2014 0336   BILITOT 1.6* 12/04/2014 0336     Lab Results  Component Value Date   WBC 8.7 12/05/2014   HGB 9.8* 12/05/2014   HCT 29.3* 12/05/2014   MCV 83.7 12/05/2014   PLT 114* 12/05/2014   Lab Results  Component Value Date   TSH 3.868 12/04/2014   IMPRESSION AND PLAN:  1) Chronic systolic HF, NYHA class 3b: he is doing very well today. Continue demadex 20mg  once a day, recheck BMET and Mag level today.  He is on potassium and magnesium supplements. Hx of hypertension but due to low output he was having HYPOtension so his hydralazine and imdur were d/c'd and he is doing much better off of these.  2) Chronic renal insufficiency, stage III: recheck BMET today.  3) Chronic blood loss anemia: SB AVM's.  Do CBC today.  4) PAF: currently in sinus rhythm: continue amiodarone as per cardiology.  He is not a candidate for anticoagulation due to #3 and high fall risk.  5) DM 2; very well controlled. Continue current insulin regimen.  An After Visit Summary was printed and given to the patient.  FOLLOW UP: No Follow-up on file.

## 2014-12-07 NOTE — Progress Notes (Signed)
Pre visit review using our clinic review tool, if applicable. No additional management support is needed unless otherwise documented below in the visit note. 

## 2014-12-08 ENCOUNTER — Ambulatory Visit: Payer: Medicare Other | Admitting: Family Medicine

## 2014-12-11 ENCOUNTER — Other Ambulatory Visit (HOSPITAL_COMMUNITY): Payer: Self-pay

## 2014-12-11 ENCOUNTER — Telehealth (HOSPITAL_COMMUNITY): Payer: Self-pay

## 2014-12-11 ENCOUNTER — Other Ambulatory Visit (HOSPITAL_COMMUNITY): Payer: Self-pay | Admitting: Cardiology

## 2014-12-11 DIAGNOSIS — I4892 Unspecified atrial flutter: Secondary | ICD-10-CM

## 2014-12-11 MED ORDER — AMIODARONE HCL 200 MG PO TABS: 200.0000 mg | ORAL_TABLET | Freq: Every day | ORAL | Status: DC

## 2014-12-11 MED ORDER — GLUCERNA ADVANCE SHAKE PO LIQD: 1.0000 | Freq: Three times a day (TID) | ORAL | Status: DC

## 2014-12-11 NOTE — Telephone Encounter (Signed)
Verdis Frederickson RN with [facility] called to report patient had 3 lb weight gain since Friday.  Weight is stable, no swelling/edema noted, and patient reports feeling "very well today".  Patient has also been drinking Glucerna 3 x daily which may be contributing.  Patient will need to keep this in account with his daily fluid intake.  Will inform MD as we are seeing this patient tomorrow in [facility] for follow up.  [name]

## 2014-12-12 ENCOUNTER — Inpatient Hospital Stay (HOSPITAL_COMMUNITY): Admit: 2014-12-12 | Payer: Medicare Other

## 2014-12-13 ENCOUNTER — Other Ambulatory Visit: Payer: Medicare Other

## 2014-12-14 ENCOUNTER — Telehealth: Payer: Self-pay | Admitting: Family Medicine

## 2014-12-14 NOTE — Telephone Encounter (Signed)
Patient is moving this week, declined PT home visit

## 2014-12-15 NOTE — Telephone Encounter (Signed)
Noted  

## 2014-12-19 ENCOUNTER — Telehealth (HOSPITAL_COMMUNITY): Payer: Self-pay | Admitting: *Deleted

## 2014-12-19 ENCOUNTER — Other Ambulatory Visit (HOSPITAL_COMMUNITY): Payer: Self-pay | Admitting: *Deleted

## 2014-12-19 ENCOUNTER — Other Ambulatory Visit: Payer: Self-pay | Admitting: Pulmonary Disease

## 2014-12-19 MED ORDER — TORSEMIDE 20 MG PO TABS: ORAL_TABLET | ORAL | Status: DC

## 2014-12-19 NOTE — Telephone Encounter (Signed)
Pt snurse called stated pts weight is up 6lbs in four days and has 3-4+ edema in both legs. I advised RN (per Dr.McLean) to have pt up his Torsemide to 40mg  bid x4days then take 40mg  am and 20mg  pm until his appt 12/26/14 @ 1:40pm.

## 2014-12-20 ENCOUNTER — Telehealth: Payer: Self-pay | Admitting: Pulmonary Disease

## 2014-12-20 NOTE — Telephone Encounter (Signed)
Called [facility]. They received a denial on pt spiriva stating SN no longer at our practice.  I gave VO for pt spiriva since pt is activily seen in office.  Called made spouse aware and nothing further needed

## 2014-12-25 ENCOUNTER — Other Ambulatory Visit: Payer: Self-pay | Admitting: *Deleted

## 2014-12-25 ENCOUNTER — Telehealth: Payer: Self-pay | Admitting: Family Medicine

## 2014-12-25 MED ORDER — TIOTROPIUM BROMIDE MONOHYDRATE 18 MCG IN CAPS: ORAL_CAPSULE | RESPIRATORY_TRACT | Status: AC

## 2014-12-25 NOTE — Telephone Encounter (Signed)
[name] from [facility] would like verbal orders to continue [name]'s physical therapy 2 x for 5 weeks.

## 2014-12-25 NOTE — Telephone Encounter (Signed)
Left message for [name] advised okay to continue PT 2x5 weeks.

## 2014-12-26 ENCOUNTER — Ambulatory Visit (HOSPITAL_COMMUNITY)
Admission: RE | Admit: 2014-12-26 | Discharge: 2014-12-26 | Disposition: A | Discharge status: Home / Self Care | Payer: Medicare Other | Source: Ambulatory Visit | Attending: Cardiology | Admitting: Cardiology

## 2014-12-26 ENCOUNTER — Encounter (HOSPITAL_COMMUNITY): Payer: Self-pay

## 2014-12-26 VITALS — BP 118/60 | HR 67 | Wt 138.8 lb

## 2014-12-26 DIAGNOSIS — I255 Ischemic cardiomyopathy: Secondary | ICD-10-CM | POA: Diagnosis not present

## 2014-12-26 DIAGNOSIS — Z794 Long term (current) use of insulin: Secondary | ICD-10-CM | POA: Diagnosis not present

## 2014-12-26 DIAGNOSIS — E1122 Type 2 diabetes mellitus with diabetic chronic kidney disease: Secondary | ICD-10-CM | POA: Diagnosis not present

## 2014-12-26 DIAGNOSIS — E11319 Type 2 diabetes mellitus with unspecified diabetic retinopathy without macular edema: Secondary | ICD-10-CM | POA: Diagnosis not present

## 2014-12-26 DIAGNOSIS — I2510 Atherosclerotic heart disease of native coronary artery without angina pectoris: Secondary | ICD-10-CM | POA: Diagnosis not present

## 2014-12-26 DIAGNOSIS — I739 Peripheral vascular disease, unspecified: Secondary | ICD-10-CM | POA: Insufficient documentation

## 2014-12-26 DIAGNOSIS — E780 Pure hypercholesterolemia: Secondary | ICD-10-CM | POA: Diagnosis not present

## 2014-12-26 DIAGNOSIS — Z95810 Presence of automatic (implantable) cardiac defibrillator: Secondary | ICD-10-CM | POA: Insufficient documentation

## 2014-12-26 DIAGNOSIS — Z951 Presence of aortocoronary bypass graft: Secondary | ICD-10-CM | POA: Insufficient documentation

## 2014-12-26 DIAGNOSIS — J449 Chronic obstructive pulmonary disease, unspecified: Secondary | ICD-10-CM | POA: Diagnosis not present

## 2014-12-26 DIAGNOSIS — I129 Hypertensive chronic kidney disease with stage 1 through stage 4 chronic kidney disease, or unspecified chronic kidney disease: Secondary | ICD-10-CM | POA: Diagnosis not present

## 2014-12-26 DIAGNOSIS — I4892 Unspecified atrial flutter: Secondary | ICD-10-CM | POA: Diagnosis not present

## 2014-12-26 DIAGNOSIS — I480 Paroxysmal atrial fibrillation: Secondary | ICD-10-CM | POA: Diagnosis not present

## 2014-12-26 DIAGNOSIS — N183 Chronic kidney disease, stage 3 (moderate): Secondary | ICD-10-CM | POA: Diagnosis not present

## 2014-12-26 DIAGNOSIS — I5022 Chronic systolic (congestive) heart failure: Secondary | ICD-10-CM | POA: Diagnosis not present

## 2014-12-26 DIAGNOSIS — R42 Dizziness and giddiness: Secondary | ICD-10-CM | POA: Diagnosis not present

## 2014-12-26 DIAGNOSIS — Z79899 Other long term (current) drug therapy: Secondary | ICD-10-CM | POA: Diagnosis not present

## 2014-12-26 DIAGNOSIS — I4891 Unspecified atrial fibrillation: Secondary | ICD-10-CM | POA: Diagnosis not present

## 2014-12-26 LAB — BASIC METABOLIC PANEL
Anion gap: 10 (ref 5–15)
BUN: 23 mg/dL — ABNORMAL HIGH (ref 6–20)
CALCIUM: 8.8 mg/dL — AB (ref 8.9–10.3)
CO2: 33 mmol/L — AB (ref 22–32)
Chloride: 90 mmol/L — ABNORMAL LOW (ref 101–111)
Creatinine, Ser: 2.17 mg/dL — ABNORMAL HIGH (ref 0.61–1.24)
GFR calc non Af Amer: 28 mL/min — ABNORMAL LOW (ref >60)
GFR, EST AFRICAN AMERICAN: 32 mL/min — AB (ref >60)
Glucose, Bld: 210 mg/dL — ABNORMAL HIGH (ref 65–99)
Potassium: 3.5 mmol/L (ref 3.5–5.1)
Sodium: 133 mmol/L — ABNORMAL LOW (ref 135–145)

## 2014-12-26 LAB — MAGNESIUM: Magnesium: 2.1 mg/dL (ref 1.7–2.4)

## 2014-12-26 MED ORDER — TORSEMIDE 20 MG PO TABS: ORAL_TABLET | ORAL | Status: DC

## 2014-12-26 MED ORDER — AMIODARONE HCL 200 MG PO TABS: 100.0000 mg | ORAL_TABLET | Freq: Every day | ORAL | Status: AC

## 2014-12-26 NOTE — Patient Instructions (Signed)
Decrease Amiodarone to 100 mg daily  Change Torsemide to 40 mg (2 tabs) in AM and 20 mg in PM every other day ALTERNATING WITH 20 mg Twice daily every other day   Labs today  Labs in 2 weeks, [facility] will draw  Your physician recommends that you schedule a follow-up appointment in: 1 month

## 2014-12-27 ENCOUNTER — Telehealth (HOSPITAL_COMMUNITY): Payer: Self-pay

## 2014-12-27 ENCOUNTER — Encounter: Payer: Medicare Other | Admitting: Internal Medicine

## 2014-12-27 NOTE — Telephone Encounter (Signed)
Can homecare check his oxygen saturation to make sure ok?

## 2014-12-27 NOTE — Progress Notes (Signed)
Patient ID: GRANVEL PROUDFOOT, male   DOB: 1936/01/12, 79 y.o.   MRN: [id number] PCP: Dr Ernestine Conrad GI: Dr Earlean Shawl.    HPI: Mr. Brue is a 79 yo male with a history of HTN, CAD s/p CABG, ICM s/p Medtronic ICD, chronic systolic heart failure, DM2, Vtach, atrial flutter, PVD and GI mass. ECHO (12/22/13) EF 20-25%, Echo  EF 40% (12/15)  Mod-severe MR.   In 9/15 he was admitted from Dr. Rosezella [place] office with volume overload and low output heart failure. Creatinine was 2.8 on admit (baseline 1.6). Central line was placed to monitor CVP and CO-OX. Initial CO-OX was 49% and he was started on Milrinone and IV lasix. Discharge weight was 151 pounds. Creatinine was 1.42.   Saw Dr Earlean Shawl for possible colon mass/thickening and felt to be result of previous diverticulitis.   Was admitted 2x in 4/16. In early April had a/c CHF with cardiorenal syndrome. Treated with milrinone and improved. Also had GI bleed at that time and EGD with candidal esophagitis. Also two colonic AVMs. Eliquis eventually stopped due to recurrent bleeding and falls. In late April admitted with PNA. Treated with abx. D/c weight was 147.   He was admitted in 7/16 with orthostatic dizziness and poor po intake.  Hydralazine and Imdur were stopped with improvement in symptoms.  Echo in 7/16 showed EF 25-30% with moderately dilated LV and moderate MR.   He returns for follow up.  He is feeling better compared to prior to hospitalization.  No lightheadedness/orthostatic symptoms.  He is walking with a walker.  No dyspnea walking short distances.  No falls.  Tremors are getting worse, he wonders if this could be amiodarone-related.  He is eating better.  No melena. Using 2 liters [place] oxygen at night. Taking all medications. [facility]ed by [facility].   Echo 12/15 EF  ~40% inferior AK moderate to severe MR. RV mildly HK Echo 4/16  EF 40-45% moderate to severe MR RV moderate dysfunction RVSP 78 Echo 7/16 EF 25-30%, moderately dilated LV, moderate MR.   Labs  02/09/14 K 3.9 Creatinine 1.38 Pro BNP 2555  Labs 02/20/14 K 3.5 NA 127 Creatinine 1.6  03/01/14 TSH 19.8  T4  1.03 T3 84  Labs 03/03/14 CEA 9.0  K 4.3 creatinine 1.2 NA 134 Hgb 12.7 TSH 19.8 T3 84.9 T4 1.03  Labs 09/20/2014: K 3.3 Creatinine 2.05  Labs 09/27/2014: Na 124 K 3.6 Creatinine 1.88 Labs 11/13/2014 K 3.1 Creatinine 2.05  Na 128  Labs 7/16 K 4.1, creatinine 2.39, HCT 32.3, LFTs normal, TSH normal  CT Virtual Colonoscopy- Diverticulosis ? Rectosigmoid Colon Ca  ROS: All systems negative except as listed in HPI, PMH and Problem List.  Past Medical History  Diagnosis Date  . VENTRICULAR TACHYCARDIA   . PERIPHERAL VASCULAR DISEASE   . HYPERTENSION     Hypertensive retinopathy-grade II OU  . HYPERCHOLESTEROLEMIA   . CORONARY ARTERY DISEASE     Hx of CABG  . Chronic systolic heart failure     Ischemic cardiomyopathy (01/2014 EF 40-45% with hypokinesis + small area of akinesis; 08/2014, Echo with EF 40-45 percent, PAS 78.  11/2014 ECHO EF 25-30%, inferior akinesis, dilated LV, moderate mitral regurg.  . SEBORRHEIC DERMATITIS   . Chronic renal insufficiency, stage III (moderate)     CrCl 40s-50s  . DIVERTICULOSIS OF COLON   . COPD, severe   . COLONIC POLYPS   . ANXIETY   . ICD (implantable cardiac defibrillator) in place   . Type II diabetes  mellitus     +background diabetic retinopathy OU  . Chronic lower back pain   . DJD (degenerative joint disease)     "hands" (05/24/2012)  . Osteoarthritis of finger   . History of atrial flutter   . Thrombus 12/2013    on ICD lead--started anticoag and his cardioversion for atrial flutter was postponed.  . Colon stricture 2015    with features worrisome for mass, also with recent rising CEA level (possible colon cancer)-GI MD= Dr. Jackelyn Hoehn has been declining colonoscopy since that time.  Dr. Liliane Channel impression on re-eval in 2015 was that the area represented chronic post-diverticulitis changes, not malignancy.  . Macular degeneration, age  related, nonexudative     OU  . Chronic blood loss anemia     Transfused 2 U pRBCs April, May, and June (10/26/14) of 2016.  GI w/u has revealed only 2 tiny non-bleeding AVMs in mid small bowel.    Current Outpatient Prescriptions  Medication Sig Dispense Refill  . amiodarone (PACERONE) 200 MG tablet Take 0.5 tablets (100 mg total) by mouth daily. 30 tablet 3  . Fluticasone-Salmeterol (ADVAIR) 500-50 MCG/DOSE AEPB Inhale 1 puff into the lungs 2 (two) times daily. 60 each 11  . folic acid (FOLVITE) 1 MG tablet Take 1 tablet (1 mg total) by mouth daily. 30 tablet 3  . guaiFENesin (MUCINEX) 600 MG 12 hr tablet Take 1 tablet (600 mg total) by mouth 2 (two) times daily. 30 tablet 0  . Insulin Glargine (LANTUS SOLOSTAR) 100 UNIT/ML Solostar Pen 10 units SQ qhs (Patient taking differently: Inject 10 Units into the skin at bedtime. ) 15 mL 11  . insulin lispro (HUMALOG KWIKPEN) 100 UNIT/ML KiwkPen Take 5 units in the morning, 5 units at noon, --if blood sugar over 100 and you eat > 50% of meals (Patient taking differently: Inject 7-9 Units into the skin 3 (three) times daily. Take 7 units in the morning, 8 units at noon, and 9 units at dinner) 5 pen 3  . levothyroxine (SYNTHROID, LEVOTHROID) 88 MCG tablet Take 1 tablet (88 mcg total) by mouth daily. 30 tablet 2  . magnesium oxide (MAG-OX) 400 (241.3 MG) MG tablet Take 1 tablet (400 mg total) by mouth 3 (three) times daily. 90 tablet 0  . Nutritional Supplements (GLUCERNA ADVANCE SHAKE) LIQD Take 1 Bottle by mouth 3 (three) times daily.    . pantoprazole (PROTONIX) 40 MG tablet Take 1 tablet (40 mg total) by mouth daily. 30 tablet 1  . potassium chloride SA (KLOR-CON M20) 20 MEQ tablet Take 1 tablet (20 mEq total) by mouth 2 (two) times daily. 60 tablet 1  . pravastatin (PRAVACHOL) 80 MG tablet Take 1 tablet (80 mg total) by mouth at bedtime. 30 tablet 0  . senna-docusate (SENOKOT-S) 8.6-50 MG per tablet Take 1 tablet by mouth at bedtime as needed for mild  constipation. 100 tablet 1  . thiamine (VITAMIN B-1) 100 MG tablet Take 1 tablet (100 mg total) by mouth daily. 30 tablet 3  . tiotropium (SPIRIVA HANDIHALER) 18 MCG inhalation capsule PLACE ONE CAPSULE INTO INHALER AND  INHALE  DAILY 30 capsule 6  . torsemide (DEMADEX) 20 MG tablet Take 40mg  am and 20mg  pm every other day ALTERNATING with 20 mg Twice daily every other day 90 tablet 1   No current facility-administered medications for this encounter.     PHYSICAL EXAM: Filed Vitals:   12/26/14 1345  BP: 118/60  Pulse: 67  Weight: 138 lb 12 oz (62.937 kg)  SpO2: 95%    General:  Frail appearing No resp difficulty. Wife present  HEENT: normal Neck: supple. JVP 8. No bruits. No lymphadenopathy or thryomegaly appreciated. Cor: PMI normal. Regular rate & rhythm. No rubs, gallops or murmurs. Lungs: clear Abdomen: soft, nontender, nondistended. No hepatosplenomegaly. No bruits or masses. Good bowel sounds. Extremities: no cyanosis, clubbing, rash, 1+ edema 1/2 to knees bilaterally.  Neuro: alert & orientedx3, cranial nerves grossly intact. Moves all 4 extremities w/o difficulty. Affect pleasant.  ASSESSMENT & PLAN: Mr Garrel Ridgel is a 79 year old with ischemic cardiomyopathy and chronic systolic heart failure with history of cardiogenic shock requiring short term milrinone. 1. Chronic systolic CHF:  Ischemic cardiomyopathy. Has Medtronic ICD. ECHO 7/16 EF 25-30%. NYHA III symptoms.  Mild volume overload on exam.  He is much improved in terms of orthostatic symptoms.  - Increase torsemide to 40 qam/20 qpm alternating with 20 mg bid. BMET today and repeat in 2 wks.  - Off carvedilol, hydralazine with orthostatic symptoms.  I am not going to restart BP-active meds at this time given the severity of his symptoms when he was admitted.  - Considering cardiomems device. Submitted prior auth.  2. A fib/Aflutter: Maintaining SR on amiodarone.  No anticoagulation with fall risk and low plts.   -  Continue amiodarone except given concern it may be contributing to pt's tremors, I will decrease it to 100 mg daily. Will need yearly eye exams. TSH and LFTS normal in 7/16.  - Eliquis stopped due to recurrent GI bleeding and fall risk.  4. CKD: Stable III. Check labs today.  5. DM: Per PCP.  6. CAD: s/p CABG.  Continue pravastatin.  Not on ASA with apixaban use.  7. COPD 8. H/o VT: He has an ICD and is now on amiodarone.  Stable 9. Hyponatremia 10. Orthostatic hypotension: Seems to be resolved now that we've cut back his BP-active meds.   Dalton McLean,MD 12/27/2014

## 2014-12-27 NOTE — Telephone Encounter (Signed)
Patient's wife calling to request DC order for home O2 faxed in to [facility], however, I do not see where this was discussed in yesterday's office visit note.  Will forward to MD to confirm this is okay to do.  [name]

## 2014-12-28 ENCOUNTER — Telehealth (HOSPITAL_COMMUNITY): Payer: Self-pay

## 2014-12-28 NOTE — Telephone Encounter (Signed)
Per [name], will have [facility] check O2 sat in the home sitting and then ambulating on RA as he has PT in the home twice weekly.  Will go from there as to if patient can DC his home O2.  [name]

## 2014-12-29 ENCOUNTER — Telehealth: Payer: Self-pay | Admitting: Family Medicine

## 2014-12-29 NOTE — Telephone Encounter (Signed)
Per Dr. Anitra Lauth pt can take otc Imodium follow directions on box. Pts wife advised and voiced understanding.

## 2014-12-29 NOTE — Telephone Encounter (Signed)
Patient has uncontrollable diarrhea. What can he take?

## 2014-12-31 ENCOUNTER — Other Ambulatory Visit: Payer: Self-pay | Admitting: Internal Medicine

## 2015-01-01 ENCOUNTER — Ambulatory Visit: Payer: Medicare Other | Admitting: Pulmonary Disease

## 2015-01-01 ENCOUNTER — Other Ambulatory Visit: Payer: Self-pay | Admitting: *Deleted

## 2015-01-01 ENCOUNTER — Ambulatory Visit (INDEPENDENT_AMBULATORY_CARE_PROVIDER_SITE_OTHER): Payer: Medicare Other | Admitting: Pulmonary Disease

## 2015-01-01 ENCOUNTER — Encounter: Payer: Self-pay | Admitting: Pulmonary Disease

## 2015-01-01 VITALS — BP 112/58 | HR 72 | Temp 97.2°F | Wt 141.2 lb

## 2015-01-01 DIAGNOSIS — E119 Type 2 diabetes mellitus without complications: Secondary | ICD-10-CM

## 2015-01-01 DIAGNOSIS — J449 Chronic obstructive pulmonary disease, unspecified: Secondary | ICD-10-CM

## 2015-01-01 DIAGNOSIS — J929 Pleural plaque without asbestos: Secondary | ICD-10-CM | POA: Diagnosis not present

## 2015-01-01 DIAGNOSIS — I255 Ischemic cardiomyopathy: Secondary | ICD-10-CM | POA: Diagnosis not present

## 2015-01-01 DIAGNOSIS — I5022 Chronic systolic (congestive) heart failure: Secondary | ICD-10-CM

## 2015-01-01 DIAGNOSIS — I2510 Atherosclerotic heart disease of native coronary artery without angina pectoris: Secondary | ICD-10-CM

## 2015-01-01 DIAGNOSIS — I779 Disorder of arteries and arterioles, unspecified: Secondary | ICD-10-CM

## 2015-01-01 DIAGNOSIS — I739 Peripheral vascular disease, unspecified: Secondary | ICD-10-CM

## 2015-01-01 DIAGNOSIS — I483 Typical atrial flutter: Secondary | ICD-10-CM

## 2015-01-01 DIAGNOSIS — D649 Anemia, unspecified: Secondary | ICD-10-CM

## 2015-01-01 DIAGNOSIS — E7800 Pure hypercholesterolemia, unspecified: Secondary | ICD-10-CM

## 2015-01-01 DIAGNOSIS — Z95810 Presence of automatic (implantable) cardiac defibrillator: Secondary | ICD-10-CM

## 2015-01-01 NOTE — Patient Instructions (Signed)
Today we updated your med list in our EPIC system...    Continue your current medications the same...  Stay on the ADVAIR twice daily, SPIRIVA daily, [place] twice daily...   Continue your home oxygen as we discussed>    2L/min w/ activity, and 1L/min at night...   Call for any questions...  Let's plan a follow up visit in 1mo, sooner if needed for problems.Marland KitchenMarland Kitchen

## 2015-01-01 NOTE — Progress Notes (Signed)
Subjective:     Patient ID: [name], male   DOB: 1935/06/03, 79 y.o.   MRN: [id number]  HPI 79 y/o WM here for a follow up visit... he has multiple medical problems as noted below...  ~  SEE PREV EPIC NOTES FOR OLDER DATA >>    KUB XRay 6/14 showed air & stool scat about the colon but stool burden NOT substantially increased, DJD in TSpine...   LABS 6/14:  FLP- TChol&LDL are ok but HDL=15, TG=169;  Chems- showed BS=111 A1c=6.9, K=5.6 BUN=35 Cr=1.6;  CBC- ok;  TSH=2.10;  PSA=0.45...  LABS 12/14:  Chems ok x BS=129, A1c=6.9, Cr=1.7;  LFTs are now normal off all Etoh;  CBC- ok [name]/ Hg=13.7, Fe=75 (15%);  CEA=5.3.Marland KitchenMarland Kitchen  PET Scan => done 12/14 => focal thickening & assoc narrowing of sigmoid colon [name]/ abnormal hypermetabolism (colon ca vs inflamm), stool in colon, atherosclerotic vasc dis, asbestos related pleural dis bilat...  ADDENDUM>> pt understands options for 1) proceed [name]/ colonoscopy & bx;  2) proceed [name]/ definitive surg approach;  3) continue watchful waiting [name]/ f/u CEA and CT Abd in ?86mo he will need to be diligent about metamucil, miralax, etc to keep stools soft & moving so as to avoid constip/ obstruction thru the narrowed sigmoid region...   ~  August 31, 2013:  4373moOV & [name] here for follow up- he has done well from the GI standpoint, denies abd pain, n/v, c/d, blood seen; he uses metamucil & states his BMs are normal, no issues; we reviewed his prev work up & we will recheck CBC (wnl) & CEA level (up to 8.0) today... His CC today is no energy, decr stamina, and DOE that seems worse than before- eg taking out the garbage etc but he readily admits NOT exercising; also notes no CP, palpit, edema; he saw [name] for Cards/EP 1/15> ischemic CM, hx VT, chr sysCHF [name]/ EF in the 35% range, s/pICD- everything stable, no shocks, told to avoid salt, same meds, encouraged exercise...  He does note some cough, sm amt of brown sput, denies f/c/s, no CP, etc... He is taking his Advair500Bid &  Mucinex Bid; exam [name]/ decr BS but clear; see labs & studies below; we decided to treat by adding Spiriva, and a course of Augmentin & Pred...     We reviewed prob list, meds, xrays and labs> see below for updates >>   CXR 4/15 showed stable heart size, pacer, s/p CABG, COPD changes- NAD, calcif pleural plaques stable...  PFT 4/15 showed FVC=2.27 (59%), FEV1=0.69 (24%), %1sec=31, mid-flows=7% predicted... This is c/[name] GOLD Stage4 COPD...   LABS 4/15:  Chems- ok [name]/ BS=71 A1c=7.1 BUN=27 Cr=1.6 (stable);  CBC- wnl;  BNP=230;  CEA=8.0 (up from 5.3)       ADDENDUM>> I had a long discussion [name]/ pt; again reviewed his situation (I told him that I was very concerned that this represented colon cancer) and proposed the same 3 options (aggressive, middle-of-the-road, conservative) [name]/ surg f/u [name], colonoscopy [name]/ bx, continued watchful waiting [name]/ another CEA + f/u CT Abd in 3-73m69moe didn't hesitate in his desire for conserv approach & will f/u in 3-73mo36modiscussed; he will call for any questions in the interim; he does not want another opinion or f/u [name]/ his specialists at this time...  ~  January 02, 2014:  73mo 50mo& [name] [name]/ [name]- seen recently after a fall at home [name]/ skin laceration [name]/  bleeding & Rx for skin tear... We reviewed the following medical problems during today's office visit >>     GOLD Stage4 COPD> on Advair500Bid, Spiriva, Mucinex; he states breathing at baseline [name]/ min cough, sput, no hemoptysis, stable DOE which he blames on his CHF; exam is clear [name]/ decr BS, no wheezing or congestion...     HBP> on Coreg6.25Bid, Vasotec5Bid, Lasix40Bid; BP= 96/60 & he feels weak, tired, fatigued; followed closely by [name], [facility]...    CAD> offASA now; Hx Lmain + 3vessel dis & CABGx4 in 2003; f/u cath 2008 [name]/ atretic LIMA to LAD; he denies CP, palpit, dizzy, ch in SOB, or ch in edema...    CHF, Ischemic Cardiomyop, ?clot on ICD lead in right atrium> Recent TEE  (7/15) showed severe LVD [name]/ EF=20-25% & mobile mass on the ICD lead as it passes thru the right atrium- likely clot (on Eliquis now) & not vegetation (cultures neg)    Hx VTach, AICD, now AFlutter> on Mexiletine150Bid, Amio200Bid, Eliquis5-1/2Bid; he is followed by [name]- generator changed 12/13, seen 8/15 [name]/ plan for Eliquis x43mothen proceed [name]/ TEE/DCCV...    ASPVDz> off ASA on Eliquis now; Hx PTA to right SFA for ABI=0.36 in past; not active enough for claud; known plaque in Carotids [name]/o signif flow reduction...    CHOL> on Prav80, Feno160; FLP 6/14 showed TChol 69, TG 169, HDL 15, LDL 21; rec to continue meds better low fat diet, incr exerc...    DM> on Glucovance2.5-500 (oneQam & 1/2Qpm); labs 4/15 showed BS=71, A1c=7.1 and reminded to avoid sweets...    GI- Divertics, Polyps, narrowing in sigmoid [name]/ elev CEA & abn PET> hx adenomatous polyp in 1997, [facility] & sigmoid narrowing; he saw [name]- declined colon, did virtual CT colon showing stricture of the rectosigmoid [name]/ asymmetrical thickening worrisome for sigmoid colon carcinoma, 5 mm polyp at 8-10 cm from the anal verge, diverticulosis as well with multiple diverticula within the rectosigmoid colon; [name] rec surg but pre-op evals showed high risk & pt opted for watchful waiting;  He has regular soft BMs [name]/ metamucil rx, no blood, not anemic, denies abn pain, but CEA is rising 5.3=>8.0 (4/15)...    Renal Insuffic> baseline Creat= 1.6-1.7; labs 7/15 on Lasix40Bid showed Cr=2.1 & Cards aware...    DJD, LBP> followed by [name] [name]/ shots in shoulders in the past; he uses rest, heat, OTC Tylenol as needed... He knows to avoid NSAIDs...    Anxiety> aware- he is not on an anxiolytic med by his pref... We reviewed prob list, meds, xrays and labs> see below for updates >>   LABS 7/15:  Chems- ok x BS=148, Cr=2.1;  CBC- ok [name]/ Hg=13.4..Marland Kitchen  ~  July 03, 2014:  657moOV & [name] reports that his breathing  is fine, great in fact & the best it's been in years; he has GOLD Stage4 COPD [name]/ last Spirometry 4/15 showing FEV1=0.69 (24%); he is on Advair500Bid & Spiriva daily, plus Mucinex, fluids etc...  He has lots of Cardiac problems [name]/ HBP, CAD including Lmain dis [name]/ CABGx4 in 2003, ischemic cardiomyopathy [name]/ chr sys CHF & EF~40%, hx AFlutter & VTach [name]/ AICD placed> followed by DrHochrein, DrBensimhon, & DrKlein;  He was hosp 9/1 - 02/03/14 by Cards [name]/ acute on chr systolic heart failure & EFGH=82-99%/ cardiorenal syndrome, AFlutter, etc> vol overload & low output- he diuresed significantly (>20 lbs) & renal function improved from Cr=2.8 on adm to 1.42 at  disch;  He last saw DrBensimhon 12/15 (note reviewed)> current meds include: Eliquis5Bid, Imdur30, Coreg3.125Bid, Apres50Q8h, Amio200, Lasix40, K10Bid; he takes extra20 Lasix prn but states he hasn't needed it lately; he is also on Prav80/d and Synthroid70mg/d...     We reviewed prob list, meds, xrays and labs> see below for updates >> He is followed for Primary [name] by DrMcGowen- DM regimen changed to Lantus 10u/d + Novolog Tid (taking 7-8-9) and last labs showed BS= 103-157 [name]/ A1c=5.9..Marland KitchenMarland Kitchen  LABS 1/16:  Ok [name]/ K=4.2, Cr=1.35, BS=103, BNP=528...  LABS 2/16:  BMet [name]/ K=3.5, BUN=29, Cr=1.49, BS=117...  ~  January 01, 2015:  615moOV & Desmen has really gone downhill over the interval, he appears cachectic/ weaker/ etc;  He has been hospitalized several times> 5/16 [name]/ GIB, anemia, along [name]/ his severe cardiac problems- Eliquis was stopped, he required transfusions, sent to PM&R for CIR due to physical deconditioning=> eventually home [name]/ visiting nurses and homePT etc;  Readmit 6/16 [name]/ dehydration, CKD;  Then again 7/16 [name]/ dizziness, hypokalemia, CKD, and his cardiomyop/ CHF; he had a paracentesis of 600cc removed via ultrasound (fluid not sent for Path) & he has not had a CEA rechecked since 9/15=> 9.0;  Last CT Abd was he virtual colon 7/14 7 his PET scan 12/14!!!        From the pulm standpoint he is stable on Advair500Bid, Spiriva once daily, Mucinex600Bid; he was treated for ?Pneumonia during one of his hospitalizations      [facility]; f/u 2DEcho [name]/ worsening LVF (EF=25-30%); he is currently taking Amio200-1/2 daily, Demadex20- taking 2am&1pm alt [name]/ 1Bid Qod schedule, K20Bid; he is off Eliquis; still on Prav40.      He remains on Protonix40, Senakot-S, Lantus & Humalog, Synthroid88, nutritional supplements...  EXAM shows Afeb, VSS, O2sat=95% on RA;  Wt=141 (down 10# in 61m60mot he says nadir was 127# & he's up;  HEENT- neg, [name]/o thrush;  Chest- few rhonchi at bases [name]/o [name]/r;  Heart- irreg, gr2/6 SEM ?gallop, no rub;  Abd- protuberant, ?ascites;  Ext- VI [name]/o c/c/e...   CXR 12/02/14 showed borderline cardiomeg, s/p CABG, left sided AICD, stable calcif pleural plaques bilat, NAD...   2DEcho 12/05/14 showed dilated LV [name]/ HK & AK and reduced LVF [name]/ EF=25-30%, mod MR, mildly dil LA, PAsys=44  LABS 7-12/2014>  Chems- stable RI [name]/ Cr=2.2-2.5, BS=90-210, Alb=2.2-2.7; GOT=60-75;  CBC- Hg=9.4-10.7  Ambulatory oxygen saturation test 01/01/15> O2sat=95-100% at rest;  He ambulated just 1 lap in the office [name]/ lowest O2sat=89% [name]/ HR=113/min...  IMP/PLAN>>  He is stable from the pulmonary standpoint, continue same meds plus his HomeO2 at 2L/min [name]/ exertion and 1L/min at night;  He is clearly going downhill- worsening CHF, ?cardiac cachexia vs other cause;  I don't think that the colon issue (sigmoid strictured area, pos PET scan, rising CEA level thru 9/15) has really been settled but he is less inclined to pursue diagnosis, GI invasive procedures, etc... Suggest we check f/u CEA which hasn't been repeated in ~68yr6yr       Problem List:  GOLD Stage4 COPD (ICD-KNL-976 ex-smoker, quit 2002...  Hx ASTHMATIC BRONCHITIS >> occas acute infectious exacerbations... ~  12/10: had bronchitic exac Rx'd [name]/ Avelox, Pred, Mucinex, etc & improved==> CXR showed  COPD/E, incr interstitial markings & scarring, calcif pleural plaques, enlarged heart & pacer, NAD...  Marland Kitchen  7/11:  similar episode, responded to similar meds... ~  CXR 6/13 showed stable  heart size s/p CABG, pacer, hyperinflated/ clear lungs [name]/ interstitial prominence, DJD in TSpine... ~  11/13: similar resp exac treated by TP [name]/ Augmentin & Pred- improved... ~  CXR 11/13 showed cardiomeg & prev CABG, left subclav AICD, atherosclerotic calcif of Ao, calcif pleural plaques, emphysema [name]/o acute changes... ~  12/14: on Advair500Bid, XopenexHFA prn, GFN1200Bid; breathing is stable [name]/o recent exac... ~  4/15: on Advair500Bid, XopenexHFA prn, Mucinex1200Bid; presents [name]/ incr dyspnea, no energy, no stamina, notes some cough/ brown phlegm, no hemoptysis; we added Spiriva daily + a course of Augmentin & Pred for 10d; really needs rehab/exercise... ~  CXR 4/15 showed stable heart size, pacer, s/p CABG, COPD changes- NAD, calcif pleural plaques stable... ~  PFT 4/15 showed FVC=2.27 (59%), FEV1=0.69 (24%), %1sec=31, mid-flows=7% predicted... This is c/[name] GOLD Stage4 COPD. ~  8/15:  Breathing is stable on ADVAIR500Bid & SPIRIVA daily + Mucinex; min cough/ sput, no hemoptysis, stable DOE; no recent resp exac... ~  2/16:  He remains stable on Advair500Bid, Spiriva daily, and Mucinex; states breathing is the best it's been in yrs! ~  8/16: He is holding his own from the pulm standpoint but has had incr difficulty [name]/ his cardiomyop/CHF- same meds and HomeO2 at 2L/min [name]/ exertion and 1L/min Qhs...   He is followed by DrMcLean/ Bensimhon/ Caryl Comes in the CHF clinic >>   HYPERTENSION (ICD-401.9) >>  ~  meds for BP & his Chr CHF= COREG 12.54mBid,  ENALAPRIL 57mid, & LASIX 201m...   ~  labs 1/10 showed K= 5.6, BUN= 31, Creat= 1.9... AMarland KitchenE/ ARB stopped... pt didn't follow up. ~  8/10: Coreg + Enalapril restarted by DrHochrein... ~  labs 9/10 showed K=5.6, BUN=19, Creat=1.5 ~  labs 12/10 showed K=4.4, BUN= 32, Creat= 1.7,  BNP= 102 ~  labs 4/11 showed K=5.1, BUN=16, Creat=1.4 ~  10/11: BP=122/68 today, and feeling well- he denies HA, fatigue, visual changes, CP, palipit, dizziness, syncope, edema, etc. ~  labs 2/13 showed K=5.7, BUN=27, Creat=1.5 ~  6/13: BP= 112/62 and he denies CP, palpit, dizzy, syncope, ch in SOB, edema... ~  Labs 6/13 showed K=5.5, BUN=21, Creat=1.4 ~  12/13: on Coreg12.5Bid, Vasotec5Bid, Lasix20; BP= 100/58 & he denies CP, palpit, ch in SOB/DOE, edema, etc; we cut the LASIX to 23m34mn swelling due to Creat=1.8 ~  6/14: on Coreg12.5Bid, Vasotec5Bid, Lasix40; BP= 120/70 & he feels the Lasix is too strong- labs show BUN=35 Cr=1.6 therefore rec decr to 1/2 tab daily (23mg28m~  12/14: on Coreg6.25Bid, Vasotec2.5Bid, Lasix20; BP= 108/62 & he is feeling well & denies CP, palpit, ch in dyspnea/ edema/ etc... ~  4/15: on Coreg 6.25Bid, Vasotec2.5Bid, Lasix40;  BP= 110/60 but he is feeling weak, tired, no stamina, no energy [name]/ DOE sl worse; he has not been exercising & will try to slowly incr his exercise program... ~  8/15: on Coreg6.25Bid, Vasotec5Bid, Lasix40Bid; BP= 96/60 & he feels weak, tired, fatigued; followed closely by [name], [facility]. ~  2/16: on Coreg3.125Bid, Apres50Tid, Lasix40, K10Bid; BP= 124/62 & he is feeling better, denies CP/ palpit/ ch in SOB/ edema...  CORONARY ARTERY DISEASE (ICD-414.00) - no chest pains, no palpit, no defib discharges... ~ Hx 3 vessel CAD [name]/ CABG x4 in 2/03 for LMain dis & 3vessel dis + mod LVD EF=35% at that time... ~ Cath 7/06 [name]/ non-filling of SVG to PDA, & LIMA to LAD was atretic, [name]/ EF=45%... ~ Cath 10/08 [name]/ norm SVG to PDA, & atretic LIMA to LAD (due to good native  vessel flow), EF=40%... note is made of a 40-50% LMain lesion... ~  EKG 1/13 showed SBrady, rightward axis, IVCD, NSSTTWA... ~  EKG 7/14 showed NSR, rate65, LBBB, T-wave abn... ~  Myoview 7/14> LBBB on EKG, medium sized fixed infer wall defect c/[name] old IWMI, no reversible ischemia noted,  EF=34% [name]/ infer wall HK...  ~  On Imdur30 + BP meds (Coreg, Apres, Lasix, KCl) and stable but he declined DrBensimhon's referral to Cardiac Rehab & says he'll do it on his own (but hasn't)!  CONGESTIVE HEART FAILURE, SYSTOLIC, CHRONIC (CHE-527.7) - ischemic cardiomyopathy & meds as above... followed by DrHochrein in the CHF clinic...  see labs above>> ~  seen 8/10 in CHF clinic- Coreg + Enalapril restarted, labs reviewed. ~  2DEcho 2/13 showed LV mildly dilated [name]/ diffuse HK, incr wall thickness c/[name] mild LVH, EF=35-40%, mild MR, mild LAdil... ~  Labs 6/13 showed Creat=1.4 & BNP=83 ~  Labs 12/13 showed Creat=1.8 and his Lasix20 was cut to just prn for swelling... ~  3/14: [name] increased his Lasix to 69m/d; pt feels this is too strong for him... ~  6/14: Labs showed K=5.6 BUN=35 Cr=1.6 and he is asked to decr the Lasix back to 1/2 tab daily (234m... ~  7/14: He saw DrHochrein> CAD (on ASA325), ischemic cardiomyop, denies CP/ palpit, but weak & SOB, BP was low after virtual colon [name]/ diarrhea; he stopped his Lasix & decr Coreg to 6.25Bid and Lisin to 2.5Bid. ~  4/15:  He remains on ASA325, & Mexiletine150Bid per [name] & Hochrein> denies CP, palpit, edema- just c/o incr DOE, BNP=230 on the Lasix40 & reminded to elim sodium... ~  7/15: TEE showed severe LVD [name]/ EF=20-25% & mobile mass on the ICD lead as it passes thru the right atrium- likely clot (on Eliquis now) & not vegetation (cultures neg) ~  Hosp 9/15 by Cards [name]/ Ac on chr sys HF> diuresed 20+lbs, 2DEcho [name]/ EF~40% now, improved overall & meds adjusted- now on Eliquis5Bid, Imdur30, Coreg3.125Bid, Apres50Q8h, Amio200, Lasix40, K10Bid; he takes extra20 Lasix prn.  Hx of VENTRICULAR TACHYCARDIA (ICD-427.1) - prev documented medication non-compliance [name]/ Amio & Mexilitene... DrHochrein has adjusted his meds... ATRIAL FLUTTER >> found 7/15 by [name]... ~ hx VTach storm [name]/ AICD placed, followed by [name]/Klein on meds... ~ DCChemung0/08 on  Amiodarone 20070m2 tabs twice daily, and MEXILETINE 150m26m... subseq decr Amio 200mg59m.. ~ f/u [name]/ DrKlein 9/09 [name]/ defib check- doing well... ~  f/u 9/10 [name]/ DrKlein on Mexilitene alone, Amio stopped for intolerance... doing well- no changes made. ~  f/u 1/13 [name]/ DrAllred> stable on Mexilitene, & no changes made... ~  12/13: [name] fixed one lead & changed the AICD generator, he remains stable on the Mexilitene... ~  3/14: He saw [name]> ischemic cardiomyop, chr sys CHF, Hx VT on Mexiletine150Bid & s/p ICD placement; norm device function, no shocks, Lasix incr to 40mg/29mo salt etc. ~  7/15: Found to be in AFlutter- TEE showed severe LVD [name]/ EF=20-25% & mobile mass on the ICD lead as it passes thru the right atrium- likely clot (on Eliquis now) & not vegetation (cultures neg); on Mexiletine150Bid, Amio200Bid, Eliquis5-1/2Bid; he is followed by [name]- generator changed 12/13, seen 8/15 [name]/ plan for Eliquis x1mo th61moroceed [name]/ TEE/DCCV... ~  2/16: on Eliquis5Bid & Amio200- followed by DrKlein...  PERIPHERAL VASCULAR DISEASE (ICD-443.9) >> on ASA 325mg/d.75m s/p PTA to right SFA for an ABI of 0.36...  ~  CDopplers 10/09 showed bilat carotid disease [name]/  bruits and mod plaque in bulbs, 0-39% bilat... no change. ~  CDopplers 11/11 showed mod heterogeneous plaque in bulbs, 0-39% bilat ICA stenoses, f/u 78yr...   He is followed for Primary [name] by DrMcGowen >>   HYPERCHOLESTEROLEMIA (ICD-272.0) - prev on Simvastatin 49md, and changed to PRAVASTATIN 8064m per DrHochrein (due to interaction of Simva & Amio)... FENOFIBRATE 160m55madded 8/09... ~  FLP Benjamin Perez8 on Simva40 showed TChol 136, TG 297, HDL 22, LDL 69- but he was ?not taking meds regularly? he freq forgets. ~  FLP Bena9 on Prav80 showed TChol 124, TG 257, HDL 24, LDL 65... rec to add Fenofibrate 160/d... ~  FLP 9/10 on Prav80+Feno160 showed TChol 137, TG 142, HDL 26, LDL 83... continue both meds. ~  FLP 4/11 on Prav80+Feno160 showed TChol  131, TG 139, HDL 33, LDL 70 ~  FLP 10/11 = pt didn't go to the lab for blood work... ~  FLP [place]3 on Prav80+Feno160 showed TChol 136, TG 105, HDL 31, LDL 84 ~  FLP 6/14 on Prav80+Feno160 showed TChol 69, TG 169, HDL 15, LDL 21 ~  Labs followed by Cards and DrMcGowen...  DIABETES MELLITUS (ICD-250.00) - on GLUCOVANCE 2.5/500- 1/2 Bid... he states his home BS checks have all been around 100 [name]/ occas nocturnal hypoglycemia> advised to take PM dose at dinner, not bedtime. ~   prev BS=177, HgA1c=7.0 in Oct08... ~  labs 2/09 showed BS=83, HgA1c=6.8 ~  labs 8/09 showed BS= 118, HgA1c= 7.3 ~  labs 9/10 showed BS= 90, A1c= 7.2 ~  labs 4/11 showed BS= 65, A1c= 6.9 ~  labs 2/13 showed BS= 76-171 ~  Labs 6/13 showed BS= 67, A1c=7.0 ~  12/13: on Glucovance2.5-500; labs today showed BS=134, A1c=7.5 and asked to incr Glucov to an extra 1/2 tab in PM... ~  6/14: on Glucovance2.5-500 (oneQam & 1/2Qpm); labs today showed BS=111, A1c=6.9 and asked to get on better low carb diet! ~  12/14: on Glucovance2.5-500 (oneQam & 1/2Qpm); labs today showed BS=126, A1c=6.9... CMarland KitchenMarland Kitchentinue same. ~  4/15: on Glucovance2.5-500 (oneQAM & 1/2QPM)l labs today showed BS=71, A1c=7.1 ~  Pt followed by DrMcGowen for primary [name] & meds changed based on his CHF, cardiorenal syndrome, Cr up to 2.5 range (subseq improved back to 1.4 range); on Lantus10 & Novolog 7-8-9 Tid [name]/ BS= 103-157 & A1c=5.9...  Marland KitchenMarland Kitchene [facility]  DIVERTICULOSIS OF COLON (ICD-562.10) - [facility]... prev polypectomy 1997 for tubular adenoma... overdue for f/u exam... COLONIC POLYPS (ICD-211.3) >>  NARROWING IN SIGMOID COLON [name]/ asymmetric thickening and elev CEA & abn hypermetabolism on PET scan >> ~  [facility] & felt to be hi risk for colonoscopy... ~  Virtual CT Colon was done 7/14 finding> stricture of the rectosigmoid [name]/ asymmetrical thickening of the rectosigmoid  colon at 30 cm from the anal verge, worrisome for sigmoid colon carcinoma, 5 mm polyp at 8-10 cm from the anal verge, diverticulosis as well with multiple diverticula within the rectosigmoid colon; Probable noncalcified pleural plaques at both lung bases; Degenerative disc disease diffusely throughout the lumbar spine with anterolisthesis of L3 on L4 by 5 mm most likely degenerative...  ~  [name] he rec surgery but he is obviously high risk=> he sent him to [facility] for clearance=> pt states that he made p his own mind against surg, BMs are better on metamucil daily. ~  12/14:  pt ret to see me for routine f/u visit & we reviewed his data from 7/14> we decided to check CEA=5.3 and PET Scan= focal thickening & assoc narrowing of sigmoid colon [name]/ abnormal hypermetabolism (colon ca vs inflamm), stool in colon, atherosclerotic vasc dis, asbestos related pleural dis bilat...  pt understands options for 1) proceed [name]/ colonoscopy & bx;  2) proceed [name]/ definitive surg approach;  3) continue watchful waiting [name]/ f/u CEA and CT Abd in ?45mo he will need to be diligent about metamucil, miralax, etc top keep stools soft & moving so as to avoid constip/ obstruction thru the narrowed sigmoid region...  ~  4/15: he has done well from the GI standpoint, denies abd pain, n/v, c/d, blood seen; he uses metamucil & states his BMs are normal, no issues; we reviewed his prev work up & rechecked CBC (wnl) & CEA level (up to 8.0) today...  I had a long discussion [name]/ pt & told him that I was very concerned that this represented colon cancer and proposed the same 3 options (aggressive, middle-of-the-road, conservative) [name]/ surg f/u [name], colonoscopy [name]/ bx, continued watchful waiting [name]/ another CEA + f/u CT Abd in 3-432mohe didn't hesitate in his desire for conserv approach & will f/u in 3-76m78mo discussed; he will call for any questions in the interim; he does not want another opinion or f/u [name]/ his specialists at this  time...  RENAL INSUFFICIENCY (ICD-588.9) - Creat in the 1.6-1.7 range... ~  labs 8/09 showed BUN= 14, Creat= 1.2 ~  labs 1/10 showed BUN= 31, Creat= 1.9 & ACE was held... f/u improved. ~  labs 9/10 showed BUN= 19, Creat= 1.5 ~  labs 4/11 showed BUN= 16, Creat= 1.4 ~  Labs 2/13 showed BUN= 27, Creat= 1.5 ~  Labs 6/13 showed BUN= 21, Creat= 1.4 ~  baseline Creat= 1.4-1.5; labs today reveals BUN=27 Creat=1,8 K=5.2; he is rec to decr the Lasix20 to just PRN for edema... ~  3/14:  [name] increased the Lasix to 48m68m.. ~  6/14:  Labs showed K=5.6 BUN=35 Cr=1.6 and he is asked to decr the Lasix back to 1/2 tab daily (20mg86m ~  12/14:  Labs showed K=5.1, BUN=28, Cr=1.7 ~  4/15::  Labs  Showed BUN=27, Cr=1.6 ~  7/15:  Labs showed BUN=35, Cr=2.1; Cards was reluctant to decr the Lasix due to edema in legs... ~  Creat rose to 2.8 & Hosp 9/15> subseq improved [name]/ diuresis etc & was ~1.4 at discharge; followed by Cards and DrMcGowen...  DEGENERATIVE JOINT DISEASE (ICD-715.90) ~  2/12:  Seen by [name] [name]/ bilat shoulder pain; XRays showed glenohumeral degen changes; given bilat injections [name]/ relief... ~  12/13:  He is c/o LBP> we rec refewrral to Ortho for further eval; Rx [name]/ rest, heat, TRAMADOL, ROBAXIN...  ANXIETY (ICD-300.00)  SEBORRHEIC DERMATITIS (ICD-690.10) - he has mod seb derm- discussed Rx [name]/ Selsun/ T-Gel/ Lotrisone Cream Prn... THIN SKIN >> he fell [name]/ bruising, ecchymses, skin avulsion right forearm- dressed 7 followed by [name]...   Past Surgical History  Procedure Laterality Date  . Coronary artery bypass graft  1990's    CABG X4  . Tonsillectomy  ?1942  . Cardiac defibrillator placement  12/2004    single chamber defibrillator- Medtronic Maxima Dr KleinCaryl ComesTee without cardioversion N/A 12/22/2013    Procedure: TRANSESOPHAGEAL ECHOCARDIOGRAM (TEE);  Surgeon: PhiliThayer Headings  [facility];  Laterality: N/A;  . Cardioversion N/A  12/22/2013  Procedure: CARDIOVERSION;  Surgeon: Thayer Headings, MD;  Location: [facility] ENDOSCOPY;  Service: Cardiovascular;  Laterality: N/A;  . Cardiovascular stress test  01/12/13    myocard perf imaging: previous infarct with a moderately reduced EF as was known previously.  No new findings.   . Gastric emptying scan  02/02/14    Normal  . Transthoracic echocardiogram  01/25/14; 04/2014;11/2014    EF 40-45%, diffuse hypokinesis, infero-basilar myocardial akinesis, PA pressure slightly high, valves fine.04/2014-Echo today EF ~40% inferior AK moderate to severe MR. 2016 echo showed EF 25-30%, inferior akinesis, dilated LV, moderate mitral regurg  . Abdominal ultrasound  01/2014    GB sludge, o/[name] normal  . Ct virtual colonoscopy diagnostic  11/2012; repeat 09/2014    Asymmetric thickening of the rectosigmoid colon worrisome for colon cancer, but further eval by pt's GI MD led to conclusion that this was likely sequela of recurrent diverticulitis.  Pt has refused colonoscopy to evaluate this region.  . Cardiovascular stress test      Lexiscan: There is a medium sized fixed inferior wall defect of moderate severity involving the apical inferior and mid inferior and basal inferoseptal segments, consistent with old inferior MI. No significant reversible ischemia.  EF 34%, inferior wall hypokinesis--intermediate risk scan (ok to do planned procedure)  . Implantable cardioverter defibrillator revision N/A 05/25/2012    Procedure: IMPLANTABLE CARDIOVERTER DEFIBRILLATOR REVISION;  Surgeon: Evans Lance, MD;  Location: [facility] CATH LAB;  Service: Cardiovascular;  Laterality: N/A;  . Right heart catheterization N/A 08/31/2014    Procedure: RIGHT HEART CATH;  Surgeon: Larey Dresser, MD;  Location: [facility] CATH LAB;  Service: Cardiovascular;  Laterality: N/A;  . Esophagogastroduodenoscopy N/A 09/03/2014    Candida esophagitis + retained gastric contents.  No bleeding. Procedure: ESOPHAGOGASTRODUODENOSCOPY (EGD);  Surgeon:  Carol Ada, MD;  Location: [facility] ENDOSCOPY;  Service: Endoscopy;  Laterality: N/A;  . Givens capsule study N/A 10/12/2014    Procedure: GIVENS CAPSULE STUDY;  Surgeon: Irene Shipper, MD;  Location: [place];  Service: Endoscopy;  Laterality: N/A;    Outpatient Encounter Prescriptions as of 01/01/2015  Medication Sig  . amiodarone (PACERONE) 200 MG tablet Take 0.5 tablets (100 mg total) by mouth daily.  . Fluticasone-Salmeterol (ADVAIR) 500-50 MCG/DOSE AEPB Inhale 1 puff into the lungs 2 (two) times daily.  . folic acid (FOLVITE) 1 MG tablet Take 1 tablet (1 mg total) by mouth daily.  Marland Kitchen guaiFENesin (MUCINEX) 600 MG 12 hr tablet Take 1 tablet (600 mg total) by mouth 2 (two) times daily.  . Insulin Glargine (LANTUS SOLOSTAR) 100 UNIT/ML Solostar Pen 10 units SQ qhs (Patient taking differently: Inject 10 Units into the skin at bedtime. )  . insulin lispro (HUMALOG KWIKPEN) 100 UNIT/ML KiwkPen Take 5 units in the morning, 5 units at noon, --if blood sugar over 100 and you eat > 50% of meals (Patient taking differently: Inject 7-9 Units into the skin 3 (three) times daily. Take 7 units in the morning, 8 units at noon, and 9 units at dinner)  . levothyroxine (SYNTHROID, LEVOTHROID) 88 MCG tablet Take 1 tablet (88 mcg total) by mouth daily.  . magnesium oxide (MAG-OX) 400 (241.3 MG) MG tablet Take 1 tablet (400 mg total) by mouth 3 (three) times daily.  . pantoprazole (PROTONIX) 40 MG tablet Take 1 tablet (40 mg total) by mouth daily.  . potassium chloride SA (KLOR-CON M20) 20 MEQ tablet Take 1 tablet (20 mEq total) by mouth 2 (two) times daily.  . pravastatin (PRAVACHOL)  80 MG tablet Take 1 tablet (80 mg total) by mouth at bedtime.  . senna-docusate (SENOKOT-S) 8.6-50 MG per tablet Take 1 tablet by mouth at bedtime as needed for mild constipation.  . thiamine (VITAMIN B-1) 100 MG tablet Take 1 tablet (100 mg total) by mouth daily.  Marland Kitchen tiotropium (SPIRIVA HANDIHALER) 18 MCG inhalation capsule PLACE ONE  CAPSULE INTO INHALER AND  INHALE  DAILY  . torsemide (DEMADEX) 20 MG tablet Take 30m am and 265mpm every other day ALTERNATING with 20 mg Twice daily every other day  . Nutritional Supplements (GLUCERNA ADVANCE SHAKE) LIQD Take 1 Bottle by mouth 3 (three) times daily. (Patient not taking: Reported on 01/01/2015)   No facility-administered encounter medications on file as of 01/01/2015.    Allergies  Allergen Reactions  . Niacin Itching    Niaspan     Current Medications, Allergies, Past Medical History, Past Surgical History, Family History, and Social History were reviewed in [employer].   Review of Systems        See HPI - all other systems neg except as noted...       The patient complains of dyspnea on exertion.  The patient denies anorexia, fever, weight loss, weight gain, vision loss, decreased hearing, hoarseness, chest pain, syncope, peripheral edema, prolonged cough, headaches, hemoptysis, abdominal pain, melena, hematochezia, severe indigestion/heartburn, hematuria, incontinence, muscle weakness, suspicious skin lesions, transient blindness, difficulty walking, depression, unusual weight change, abnormal bleeding, enlarged lymph nodes, and angioedema.     Objective:   Physical Exam    WD, WN, 7760/o WM in NAD... GENERAL:  Alert & oriented; pleasant & cooperative... HEENT:  [name]/AT, EOM-wnl, PERRLA, EACs-clear, TMs-wnl, NOSE-clear, THROAT-clear & wnl. NECK:  Supple [name]/ fairROM; no JVD; normal carotid impulses [name]/o bruits; no thyromegaly or nodules palpated; no lymphadenopathy. CHEST:  Few scat rhonchi without wheezing, rales, or signs of consolid... HEART:  Regular Rhythm;  gr1/6 SEM, without rubs or gallops heard... ABDOMEN:  Soft & nontender; normal bowel sounds; no organomegaly or masses detected. EXT: without deformities, mild arthritic changes; no varicose veins/ venous insuffic/ or edema. NEURO:  CN's intact; motor testing normal; sensory  testing normal; gait normal & balance OK. DERM:  No lesions noted; no rash etc...  RADIOLOGY DATA:  Reviewed in the EPIC EMR & discussed [name]/ the patient...  LABORATORY DATA:  Reviewed in the EPIC EMR & discussed [name]/ the patient...    Assessment:      COPD/ AB>  PFTs show GOLD Stage4 COPD; we are treating [name]/ Advair, Spiriva, Xopenex prn, Mucinex; asked to take meds regularly & incr exercise... 4/15> we will treat [name]/ Augmentin & Pred for his resp symptoms, incr SOB, etc... 8/15> back to baseline breathing, no exac; currently on Eliquis awaiting TEE/ DCCV by Cards... 2/16> he reports breathing is the best it's been in yrs- continue same rx. 8/16> breathing is stable despite worsening CHF & other medical issues  HBP>  Managed by Cards & much improved...  CAD>  S/p CABG, he is too sedentary & asked to incr exercise; no ischemia on Myoview 7/14...  CHF, Cardiomyopathy>  Followed by DrHochrein, Bensimhon, & TaLovena Lecontinue meds...  Hx VTach, now AFlutter>  off Mexilitene & on Amio + Eliquis; has AICD [name]/ ? clot on lead in right atrium=> followed by Cards...  ASPVD>  He is s/p right SFA PTA & doing satis, but needs to incr exercise & continue ASA...  CHOL>  On Prav80 + FLP looks good...Marland KitchenMarland Kitchen  DM>  On diet + Lantus/ Novolog per DrMcGowen; control looks a little tight [name]/ A1c=5.9, watch BSs...  RECTOSIGMOID NARROWING >> [name]/ CEA 5.3=>8.0 and Abn PET scan as above; I told him I thought this was poss a colon cancer; we outlined his options and he again has chosen watchful waiting & declines colonoscopy or surgery; he had f/u [facility] who feels that this area of thickening is benign scar tissue & he will maintain GI & primary [name] follow up...  Renal Insuffic>  Creat= 1.35 currently, mus=ch improved...  DJD>  Aware, stable on OTC meds...  Anxiety>  Stable & not on anxiolytic Rx...     Plan:     Patient's Medications  New Prescriptions   No medications on file  Previous Medications   AMIODARONE  (PACERONE) 200 MG TABLET    Take 0.5 tablets (100 mg total) by mouth daily.   FLUTICASONE-SALMETEROL (ADVAIR) 500-50 MCG/DOSE AEPB    Inhale 1 puff into the lungs 2 (two) times daily.   FOLIC ACID (FOLVITE) 1 MG TABLET    Take 1 tablet (1 mg total) by mouth daily.   GUAIFENESIN (MUCINEX) 600 MG 12 HR TABLET    Take 1 tablet (600 mg total) by mouth 2 (two) times daily.   INSULIN GLARGINE (LANTUS SOLOSTAR) 100 UNIT/ML SOLOSTAR PEN    10 units SQ qhs   INSULIN LISPRO (HUMALOG KWIKPEN) 100 UNIT/ML KIWKPEN    Take 5 units in the morning, 5 units at noon, --if blood sugar over 100 and you eat > 50% of meals   LEVOTHYROXINE (SYNTHROID, LEVOTHROID) 88 MCG TABLET    Take 1 tablet (88 mcg total) by mouth daily.   MAGNESIUM OXIDE (MAG-OX) 400 (241.3 MG) MG TABLET    Take 1 tablet (400 mg total) by mouth 3 (three) times daily.   NUTRITIONAL SUPPLEMENTS (GLUCERNA ADVANCE SHAKE) LIQD    Take 1 Bottle by mouth 3 (three) times daily.   PANTOPRAZOLE (PROTONIX) 40 MG TABLET    Take 1 tablet (40 mg total) by mouth daily.   POTASSIUM CHLORIDE SA (KLOR-CON M20) 20 MEQ TABLET    Take 1 tablet (20 mEq total) by mouth 2 (two) times daily.   PRAVASTATIN (PRAVACHOL) 80 MG TABLET    Take 1 tablet (80 mg total) by mouth at bedtime.   SENNA-DOCUSATE (SENOKOT-S) 8.6-50 MG PER TABLET    Take 1 tablet by mouth at bedtime as needed for mild constipation.   THIAMINE (VITAMIN B-1) 100 MG TABLET    Take 1 tablet (100 mg total) by mouth daily.   TIOTROPIUM (SPIRIVA HANDIHALER) 18 MCG INHALATION CAPSULE    PLACE ONE CAPSULE INTO INHALER AND  INHALE  DAILY   TORSEMIDE (DEMADEX) 20 MG TABLET    Take 31m am and 274mpm every other day ALTERNATING with 20 mg Twice daily every other day  Modified Medications   No medications on file  Discontinued Medications   No medications on file

## 2015-01-02 ENCOUNTER — Encounter: Payer: Self-pay | Admitting: Family Medicine

## 2015-01-02 ENCOUNTER — Ambulatory Visit (INDEPENDENT_AMBULATORY_CARE_PROVIDER_SITE_OTHER): Payer: Medicare Other | Admitting: Family Medicine

## 2015-01-02 VITALS — BP 153/55 | HR 70 | Temp 98.0°F | Resp 16 | Ht 67.0 in | Wt 143.0 lb

## 2015-01-02 DIAGNOSIS — I255 Ischemic cardiomyopathy: Secondary | ICD-10-CM | POA: Diagnosis not present

## 2015-01-02 DIAGNOSIS — I5022 Chronic systolic (congestive) heart failure: Secondary | ICD-10-CM

## 2015-01-02 DIAGNOSIS — I4892 Unspecified atrial flutter: Secondary | ICD-10-CM

## 2015-01-02 DIAGNOSIS — J438 Other emphysema: Secondary | ICD-10-CM

## 2015-01-02 DIAGNOSIS — E119 Type 2 diabetes mellitus without complications: Secondary | ICD-10-CM

## 2015-01-02 DIAGNOSIS — D500 Iron deficiency anemia secondary to blood loss (chronic): Secondary | ICD-10-CM | POA: Diagnosis not present

## 2015-01-02 MED ORDER — PRAVASTATIN SODIUM 80 MG PO TABS: 80.0000 mg | ORAL_TABLET | Freq: Every day | ORAL | Status: DC

## 2015-01-02 NOTE — Progress Notes (Signed)
OFFICE VISIT  01/02/2015   CC:  Chief Complaint  Patient presents with  . Follow-up    2 week f/u. Pt is not fasting.    HPI:    Patient is a 79 y.o. Caucasian male who presents for 3 week f/u. At recent cardiac f/u on 12/27/14 his torsemide was increased to 40mg  qAM and 20mg  qPM alt with 20mg  bid--needs repeat BMET about a week from now per cardiology note.  They are considering a cardiomems device: prior auth submitted. Amiodarone was decreased to 100 mg daily b/c it was thought it may be causing pt's tremors.   His orthostatic hypotension sx's have been resolved since being taken off B-blocker, hydralazine, and imdur.   Saw pulm yesterday, all stable: use oxygen 1L hs and 2L with activity.  Continue current med regimen.  His wt is up 5 lbs over the last 7d. He is eating better, says his "normal" wt is about 145. Tremors improved.   Says breathing feels fine/baseline.  No abd pains. Recent brief bout of diarrhea but now resolved.  No n/v.      Past Medical History  Diagnosis Date  . VENTRICULAR TACHYCARDIA   . PERIPHERAL VASCULAR DISEASE   . HYPERTENSION     Hypertensive retinopathy-grade II OU  . HYPERCHOLESTEROLEMIA   . CORONARY ARTERY DISEASE     Hx of CABG  . Chronic systolic heart failure     Ischemic cardiomyopathy (01/2014 EF 40-45% with hypokinesis + small area of akinesis; 08/2014, Echo with EF 40-45 percent, PAS 78.  11/2014 ECHO EF 25-30%, inferior akinesis, dilated LV, moderate mitral regurg.  . SEBORRHEIC DERMATITIS   . Chronic renal insufficiency, stage III (moderate)     CrCl 40s-50s  . DIVERTICULOSIS OF COLON   . COPD, severe   . COLONIC POLYPS   . ANXIETY   . ICD (implantable cardiac defibrillator) in place   . Type II diabetes mellitus     +background diabetic retinopathy OU  . Chronic lower back pain   . DJD (degenerative joint disease)     "hands" (05/24/2012)  . Osteoarthritis of finger   . History of atrial flutter   . Thrombus 12/2013    on ICD  lead--started anticoag and his cardioversion for atrial flutter was postponed.  . Colon stricture 2015    with features worrisome for mass, also with recent rising CEA level (possible colon cancer)-GI MD= Dr. Jackelyn Hoehn has been declining colonoscopy since that time.  Dr. Liliane Channel impression on re-eval in 2015 was that the area represented chronic post-diverticulitis changes, not malignancy.  . Macular degeneration, age related, nonexudative     OU  . Chronic blood loss anemia     Transfused 2 U pRBCs April, May, and June (10/26/14) of 2016.  GI w/u has revealed only 2 tiny non-bleeding AVMs in mid small bowel.    Past Surgical History  Procedure Laterality Date  . Coronary artery bypass graft  1990's    CABG X4  . Tonsillectomy  ?1942  . Cardiac defibrillator placement  12/2004    single chamber defibrillator- Medtronic Maxima Dr Caryl Comes   . Tee without cardioversion N/A 12/22/2013    Procedure: TRANSESOPHAGEAL ECHOCARDIOGRAM (TEE);  Surgeon: Thayer Headings, MD;  Location: [place];  Service: Cardiovascular;  Laterality: N/A;  . Cardioversion N/A 12/22/2013    Procedure: CARDIOVERSION;  Surgeon: Thayer Headings, MD;  Location: [place];  Service: Cardiovascular;  Laterality: N/A;  . Cardiovascular stress test  01/12/13  myocard perf imaging: previous infarct with a moderately reduced EF as was known previously.  No new findings.   . Gastric emptying scan  02/02/14    Normal  . Transthoracic echocardiogram  01/25/14; 04/2014;11/2014    EF 40-45%, diffuse hypokinesis, infero-basilar myocardial akinesis, PA pressure slightly high, valves fine.04/2014-Echo today EF ~40% inferior AK moderate to severe MR. 2016 echo showed EF 25-30%, inferior akinesis, dilated LV, moderate mitral regurg  . Abdominal ultrasound  01/2014    GB sludge, o/w normal  . Ct virtual colonoscopy diagnostic  11/2012; repeat 09/2014    Asymmetric thickening of the rectosigmoid colon worrisome for colon cancer, but further  eval by pt's GI MD led to conclusion that this was likely sequela of recurrent diverticulitis.  Pt has refused colonoscopy to evaluate this region.  . Cardiovascular stress test      Lexiscan: There is a medium sized fixed inferior wall defect of moderate severity involving the apical inferior and mid inferior and basal inferoseptal segments, consistent with old inferior MI. No significant reversible ischemia.  EF 34%, inferior wall hypokinesis--intermediate risk scan (ok to do planned procedure)  . Implantable cardioverter defibrillator revision N/A 05/25/2012    Procedure: IMPLANTABLE CARDIOVERTER DEFIBRILLATOR REVISION;  Surgeon: Evans Lance, MD;  Location: [facility] CATH LAB;  Service: Cardiovascular;  Laterality: N/A;  . Right heart catheterization N/A 08/31/2014    Procedure: RIGHT HEART CATH;  Surgeon: Larey Dresser, MD;  Location: [facility] CATH LAB;  Service: Cardiovascular;  Laterality: N/A;  . Esophagogastroduodenoscopy N/A 09/03/2014    Candida esophagitis + retained gastric contents.  No bleeding. Procedure: ESOPHAGOGASTRODUODENOSCOPY (EGD);  Surgeon: Carol Ada, MD;  Location: [facility] ENDOSCOPY;  Service: Endoscopy;  Laterality: N/A;  . Givens capsule study N/A 10/12/2014    Procedure: GIVENS CAPSULE STUDY;  Surgeon: Irene Shipper, MD;  Location: [place];  Service: Endoscopy;  Laterality: N/A;    Outpatient Prescriptions Prior to Visit  Medication Sig Dispense Refill  . amiodarone (PACERONE) 200 MG tablet Take 0.5 tablets (100 mg total) by mouth daily. 30 tablet 3  . Fluticasone-Salmeterol (ADVAIR) 500-50 MCG/DOSE AEPB Inhale 1 puff into the lungs 2 (two) times daily. 60 each 11  . folic acid (FOLVITE) 1 MG tablet Take 1 tablet (1 mg total) by mouth daily. 30 tablet 3  . guaiFENesin (MUCINEX) 600 MG 12 hr tablet Take 1 tablet (600 mg total) by mouth 2 (two) times daily. 30 tablet 0  . Insulin Glargine (LANTUS SOLOSTAR) 100 UNIT/ML Solostar Pen 10 units SQ qhs (Patient taking differently: Inject  10 Units into the skin at bedtime. ) 15 mL 11  . insulin lispro (HUMALOG KWIKPEN) 100 UNIT/ML KiwkPen Take 5 units in the morning, 5 units at noon, --if blood sugar over 100 and you eat > 50% of meals (Patient taking differently: Inject 7-9 Units into the skin 3 (three) times daily. Take 7 units in the morning, 8 units at noon, and 9 units at dinner) 5 pen 3  . levothyroxine (SYNTHROID, LEVOTHROID) 88 MCG tablet Take 1 tablet (88 mcg total) by mouth daily. 30 tablet 2  . magnesium oxide (MAG-OX) 400 (241.3 MG) MG tablet Take 1 tablet (400 mg total) by mouth 3 (three) times daily. 90 tablet 0  . Nutritional Supplements (GLUCERNA ADVANCE SHAKE) LIQD Take 1 Bottle by mouth 3 (three) times daily.    . pantoprazole (PROTONIX) 40 MG tablet Take 1 tablet (40 mg total) by mouth daily. 30 tablet 1  . potassium chloride SA (KLOR-CON  M20) 20 MEQ tablet Take 1 tablet (20 mEq total) by mouth 2 (two) times daily. 60 tablet 1  . senna-docusate (SENOKOT-S) 8.6-50 MG per tablet Take 1 tablet by mouth at bedtime as needed for mild constipation. 100 tablet 1  . thiamine (VITAMIN B-1) 100 MG tablet Take 1 tablet (100 mg total) by mouth daily. 30 tablet 3  . tiotropium (SPIRIVA HANDIHALER) 18 MCG inhalation capsule PLACE ONE CAPSULE INTO INHALER AND  INHALE  DAILY 30 capsule 6  . torsemide (DEMADEX) 20 MG tablet Take 40mg  am and 20mg  pm every other day ALTERNATING with 20 mg Twice daily every other day 90 tablet 1  . pravastatin (PRAVACHOL) 80 MG tablet Take 1 tablet (80 mg total) by mouth at bedtime. 30 tablet 0   No facility-administered medications prior to visit.    Allergies  Allergen Reactions  . Niacin Itching    Niaspan     ROS As per HPI  PE: Blood pressure 153/55, pulse 70, temperature 98 F (36.7 C), temperature source Oral, resp. rate 16, height 5\' 7"  (1.702 m), weight 143 lb (64.864 kg), SpO2 98 %. Gen: Alert, well appearing.  Patient is oriented to person, place, time, and situation. KDX:IPJA:  no injection, icteris, swelling, or exudate.  EOMI, PERRLA. Mouth: lips without lesion/swelling.  Oral mucosa pink and moist. Oropharynx without erythema, exudate, or swelling.  CV: RRR 1/6 syst murmur LUNGS: CTA bilat, nonlabored resps ABD: soft, distended but not tense.  +Flank dullness.  No tenderness, no HSM, no mass, no bruit. EXT: 3+ bilat LE pitting edema  LABS:  Lab Results  Component Value Date   TSH 3.868 12/04/2014   Lab Results  Component Value Date   WBC 10.6* 12/07/2014   HGB 10.7* 12/07/2014   HCT 32.3* 12/07/2014   MCV 86.5 12/07/2014   PLT 136.0* 12/07/2014   Lab Results  Component Value Date   CREATININE 2.17* 12/26/2014   BUN 23* 12/26/2014   NA 133* 12/26/2014   K 3.5 12/26/2014   CL 90* 12/26/2014   CO2 33* 12/26/2014   Lab Results  Component Value Date   ALT 23 12/07/2014   AST 59* 12/07/2014   ALKPHOS 133* 12/07/2014   BILITOT 1.5* 12/07/2014   Lab Results  Component Value Date   CHOL 83 02/01/2014   Lab Results  Component Value Date   HDL 10* 02/01/2014   Lab Results  Component Value Date   LDLCALC 53 02/01/2014   Lab Results  Component Value Date   TRIG 102 02/01/2014   Lab Results  Component Value Date   CHOLHDL 8.3 02/01/2014   Lab Results  Component Value Date   HGBA1C 5.1 11/04/2014   IMPRESSION AND PLAN:  1) Chronic systolic CHF: stable but wt gradually going up.  Abd distention perhaps a bit more prominent than when I last saw him. I told him to increase his night time torsemide to 40mg  tonight only, then resume schedule that he is currently on (see HPI).  Continuing off b-blocker due to symptomatic hypotension.  Also remaining off hydralazine and imdur since his orthostatic dizziness/hypotension has improved since d/c of these meds. BMET 1 week.   Cardiomems device being considered by pt's CHF MD.  2) A flutter/a fib/hx of v tach: stable, sounds like he is in sinus rhythm now.  Amiodarone 100mg  now this lower dose has  led to some improvement in his tremors.  Not anticoag candidate due to chronic GI blood loss and high fall risk.  Has AICD.  3) COPD: recent f/u with pulmonologist went well.  He needs his oxygen 1 L hs and 2 L prn activity. Continue advair 500/50, spiriva, and q4h prn albuterol.  4) DM 2, excellent control.  Next A1c ok to be put off until 04/2015.  5) Chronic GI blood loss/anemia: recheck CBC 1 week.  An After Visit Summary was printed and given to the patient.  FOLLOW UP: Return for Lab visit 1 week.  3- 4 mo f/u routine chronic illness f/u.

## 2015-01-02 NOTE — Progress Notes (Signed)
Pre visit review using our clinic review tool, if applicable. No additional management support is needed unless otherwise documented below in the visit note. 

## 2015-01-03 ENCOUNTER — Telehealth (HOSPITAL_COMMUNITY): Payer: Self-pay | Admitting: Cardiology

## 2015-01-03 ENCOUNTER — Telehealth: Payer: Self-pay | Admitting: *Deleted

## 2015-01-03 DIAGNOSIS — I5022 Chronic systolic (congestive) heart failure: Secondary | ICD-10-CM

## 2015-01-03 DIAGNOSIS — R188 Other ascites: Secondary | ICD-10-CM

## 2015-01-03 MED ORDER — SPIRONOLACTONE 50 MG PO TABS: 50.0000 mg | ORAL_TABLET | Freq: Every day | ORAL | Status: DC

## 2015-01-03 NOTE — Telephone Encounter (Signed)
I want him to continue taking demadex at the current dosing schedule, and I am adding spironolactone 50mg  once a day.   I sent the rx to his pharmacy already. Also, ask the nurse what his bp and HR were today.  -thx

## 2015-01-03 NOTE — Telephone Encounter (Signed)
Per Dr. Anitra Lauth okay to start spironolactone. Left message for pt to call back.

## 2015-01-03 NOTE — Telephone Encounter (Signed)
TRIAGE CALL  [facility] nurse called shortly after patients [place] visit Reports increased abdominal girth, hard/tight to the touch Increased weight x 1 pound overnight despite increase in diuretics x 1 per pts PCP Pt does report to having increased fatigue  i spoke with patient and he agrees to the above symptoms, feels like he would benefit from paracentesis again  This has help so much in the past when his belly gets tight  Above reviewed with Dr.Bensimhon and Rebecca Eaton Agreeable to paracentesis- scheduled for 01/08/15  [facility] RN aware and patient aware, also advised to call if symptoms get worse

## 2015-01-03 NOTE — Telephone Encounter (Signed)
Lauren with [facility] LMOM on 01/03/15 at 11:30am stating that pt was seen in our office yesterday and was told to increase his torsemide x 1 day. She stated that pt has had 1lb weight gain since visit, has 3+ edema left leg and 2+ edema right leg. She stated that he looks very weak and lethargic and just no looking well today. She stated that we could either call her back or pt and advise them. Please advise. Thanks.

## 2015-01-03 NOTE — Telephone Encounter (Signed)
Spoke to home health nurse and she stated that she was driving at the time and will have to call back with pts BP and HR for today. She also stated that she has to do blood work next week (Wednesday) for Dr. Haroldine Laws and wanted to know if she could also to the blood work ordered for Dr. Anitra Lauth. I advised her that that should not be a problem. I spoke to pts wife and she stated that the nurse also contacted Dr. Gillermina Hu office and they are going to try to arrange to have some of the fluid he is holding removed. She wanted to know if it would be okay to wait on p/u the Spironolactone until she hears back from Dr. Gillermina Hu office. Please advise. Thanks.

## 2015-01-05 ENCOUNTER — Ambulatory Visit (HOSPITAL_COMMUNITY)
Admission: RE | Admit: 2015-01-05 | Discharge: 2015-01-05 | Disposition: A | Discharge status: Home / Self Care | Payer: Medicare Other | Source: Ambulatory Visit | Attending: Internal Medicine | Admitting: Internal Medicine

## 2015-01-05 ENCOUNTER — Telehealth (HOSPITAL_COMMUNITY): Payer: Self-pay | Admitting: *Deleted

## 2015-01-05 DIAGNOSIS — I5022 Chronic systolic (congestive) heart failure: Secondary | ICD-10-CM

## 2015-01-05 DIAGNOSIS — R188 Other ascites: Secondary | ICD-10-CM | POA: Insufficient documentation

## 2015-01-05 NOTE — Telephone Encounter (Signed)
LATE ENTRY:  [name] wife called earlier today to states pt's wt was up: Tue 139.8 Wed 140.8 Thur 139 Today 143, up 4 lbs from yesterday  She reported pt's abd was more distended and he has LE edema, she reports is breathing is at baseline but she is concerned since he looks swollen to her.  PT is taking meds as prescriged including Torsemide 40 mg in AM and 20 mg in PM every other day ALTERNATING with 20 mg Twice daily every other day, and he took the 40/20 dose yesterday.  Was able to call to [facility] and they were able to work pt in for paracentesis today.

## 2015-01-05 NOTE — Procedures (Signed)
US guided therapeutic paracentesis performed yielding 2.7 liters yellow fluid. No immediate complications. 

## 2015-01-08 ENCOUNTER — Ambulatory Visit (HOSPITAL_COMMUNITY): Admission: RE | Admit: 2015-01-08 | Payer: Medicare Other | Source: Ambulatory Visit

## 2015-01-08 ENCOUNTER — Encounter: Payer: Self-pay | Admitting: Family Medicine

## 2015-01-09 ENCOUNTER — Other Ambulatory Visit: Payer: Medicare Other

## 2015-01-09 MED ORDER — MAGNESIUM OXIDE 400 (241.3 MG) MG PO TABS: 400.0000 mg | ORAL_TABLET | Freq: Three times a day (TID) | ORAL | Status: AC

## 2015-01-09 NOTE — Telephone Encounter (Signed)
Pts wife advised and voiced understanding. She stated that pt had 2.7 litters of fluid removed the other day, just wanted to let Dr. Anitra Lauth know. Also requested refill for magnesium, Rx sent to pharmacy.

## 2015-01-10 ENCOUNTER — Inpatient Hospital Stay (HOSPITAL_COMMUNITY)
Admission: EM | Admit: 2015-01-10 | Discharge: 2015-01-12 | DRG: 640 | Disposition: A | Discharge status: Home Health | Payer: Medicare Other | Attending: Internal Medicine | Admitting: Internal Medicine

## 2015-01-10 ENCOUNTER — Emergency Department (HOSPITAL_COMMUNITY): Payer: Medicare Other

## 2015-01-10 ENCOUNTER — Encounter (HOSPITAL_COMMUNITY): Payer: Self-pay | Admitting: *Deleted

## 2015-01-10 ENCOUNTER — Ambulatory Visit: Payer: Medicare Other | Admitting: Family Medicine

## 2015-01-10 DIAGNOSIS — E780 Pure hypercholesterolemia: Secondary | ICD-10-CM | POA: Diagnosis present

## 2015-01-10 DIAGNOSIS — I255 Ischemic cardiomyopathy: Secondary | ICD-10-CM | POA: Diagnosis present

## 2015-01-10 DIAGNOSIS — E1122 Type 2 diabetes mellitus with diabetic chronic kidney disease: Secondary | ICD-10-CM | POA: Diagnosis present

## 2015-01-10 DIAGNOSIS — I739 Peripheral vascular disease, unspecified: Secondary | ICD-10-CM | POA: Diagnosis present

## 2015-01-10 DIAGNOSIS — J449 Chronic obstructive pulmonary disease, unspecified: Secondary | ICD-10-CM | POA: Diagnosis present

## 2015-01-10 DIAGNOSIS — Z794 Long term (current) use of insulin: Secondary | ICD-10-CM

## 2015-01-10 DIAGNOSIS — F419 Anxiety disorder, unspecified: Secondary | ICD-10-CM | POA: Diagnosis present

## 2015-01-10 DIAGNOSIS — H3531 Nonexudative age-related macular degeneration: Secondary | ICD-10-CM | POA: Diagnosis present

## 2015-01-10 DIAGNOSIS — Z951 Presence of aortocoronary bypass graft: Secondary | ICD-10-CM | POA: Diagnosis not present

## 2015-01-10 DIAGNOSIS — R296 Repeated falls: Secondary | ICD-10-CM | POA: Diagnosis present

## 2015-01-10 DIAGNOSIS — E1151 Type 2 diabetes mellitus with diabetic peripheral angiopathy without gangrene: Secondary | ICD-10-CM | POA: Diagnosis present

## 2015-01-10 DIAGNOSIS — G9340 Encephalopathy, unspecified: Secondary | ICD-10-CM | POA: Diagnosis present

## 2015-01-10 DIAGNOSIS — D500 Iron deficiency anemia secondary to blood loss (chronic): Secondary | ICD-10-CM | POA: Diagnosis present

## 2015-01-10 DIAGNOSIS — Z7951 Long term (current) use of inhaled steroids: Secondary | ICD-10-CM | POA: Diagnosis not present

## 2015-01-10 DIAGNOSIS — Z86718 Personal history of other venous thrombosis and embolism: Secondary | ICD-10-CM | POA: Diagnosis not present

## 2015-01-10 DIAGNOSIS — I5022 Chronic systolic (congestive) heart failure: Secondary | ICD-10-CM | POA: Diagnosis present

## 2015-01-10 DIAGNOSIS — R531 Weakness: Secondary | ICD-10-CM | POA: Diagnosis not present

## 2015-01-10 DIAGNOSIS — I472 Ventricular tachycardia: Secondary | ICD-10-CM | POA: Diagnosis present

## 2015-01-10 DIAGNOSIS — I482 Chronic atrial fibrillation, unspecified: Secondary | ICD-10-CM | POA: Diagnosis present

## 2015-01-10 DIAGNOSIS — N183 Chronic kidney disease, stage 3 (moderate): Secondary | ICD-10-CM | POA: Diagnosis present

## 2015-01-10 DIAGNOSIS — H35039 Hypertensive retinopathy, unspecified eye: Secondary | ICD-10-CM | POA: Diagnosis present

## 2015-01-10 DIAGNOSIS — Z86010 Personal history of colonic polyps: Secondary | ICD-10-CM

## 2015-01-10 DIAGNOSIS — E871 Hypo-osmolality and hyponatremia: Secondary | ICD-10-CM | POA: Diagnosis present

## 2015-01-10 DIAGNOSIS — I130 Hypertensive heart and chronic kidney disease with heart failure and stage 1 through stage 4 chronic kidney disease, or unspecified chronic kidney disease: Secondary | ICD-10-CM | POA: Diagnosis present

## 2015-01-10 DIAGNOSIS — Z79899 Other long term (current) drug therapy: Secondary | ICD-10-CM | POA: Diagnosis not present

## 2015-01-10 DIAGNOSIS — I4892 Unspecified atrial flutter: Secondary | ICD-10-CM | POA: Diagnosis present

## 2015-01-10 DIAGNOSIS — I2510 Atherosclerotic heart disease of native coronary artery without angina pectoris: Secondary | ICD-10-CM | POA: Diagnosis present

## 2015-01-10 DIAGNOSIS — M545 Low back pain: Secondary | ICD-10-CM | POA: Diagnosis present

## 2015-01-10 DIAGNOSIS — N189 Chronic kidney disease, unspecified: Secondary | ICD-10-CM

## 2015-01-10 DIAGNOSIS — Z87891 Personal history of nicotine dependence: Secondary | ICD-10-CM

## 2015-01-10 DIAGNOSIS — E039 Hypothyroidism, unspecified: Secondary | ICD-10-CM | POA: Diagnosis present

## 2015-01-10 DIAGNOSIS — Z95810 Presence of automatic (implantable) cardiac defibrillator: Secondary | ICD-10-CM

## 2015-01-10 DIAGNOSIS — N179 Acute kidney failure, unspecified: Secondary | ICD-10-CM | POA: Diagnosis present

## 2015-01-10 DIAGNOSIS — I10 Essential (primary) hypertension: Secondary | ICD-10-CM

## 2015-01-10 DIAGNOSIS — Z888 Allergy status to other drugs, medicaments and biological substances status: Secondary | ICD-10-CM

## 2015-01-10 DIAGNOSIS — I340 Nonrheumatic mitral (valve) insufficiency: Secondary | ICD-10-CM | POA: Diagnosis present

## 2015-01-10 DIAGNOSIS — G8929 Other chronic pain: Secondary | ICD-10-CM | POA: Diagnosis present

## 2015-01-10 DIAGNOSIS — E119 Type 2 diabetes mellitus without complications: Secondary | ICD-10-CM

## 2015-01-10 HISTORY — DX: Presence of automatic (implantable) cardiac defibrillator: Z95.810

## 2015-01-10 LAB — URINALYSIS W MICROSCOPIC (NOT AT ARMC)
Bilirubin Urine: NEGATIVE
GLUCOSE, UA: NEGATIVE mg/dL
HGB URINE DIPSTICK: NEGATIVE
Ketones, ur: NEGATIVE mg/dL
LEUKOCYTES UA: NEGATIVE
Nitrite: NEGATIVE
Protein, ur: NEGATIVE mg/dL
SPECIFIC GRAVITY, URINE: 1.006 (ref 1.005–1.030)
UROBILINOGEN UA: 0.2 mg/dL (ref 0.0–1.0)
pH: 7 (ref 5.0–8.0)

## 2015-01-10 LAB — CBC
HEMATOCRIT: 30.1 % — AB (ref 39.0–52.0)
Hemoglobin: 10.3 g/dL — ABNORMAL LOW (ref 13.0–17.0)
MCH: 28.5 pg (ref 26.0–34.0)
MCHC: 34.2 g/dL (ref 30.0–36.0)
MCV: 83.1 fL (ref 78.0–100.0)
Platelets: 130 10*3/uL — ABNORMAL LOW (ref 150–400)
RBC: 3.62 MIL/uL — ABNORMAL LOW (ref 4.22–5.81)
RDW: 19.9 % — ABNORMAL HIGH (ref 11.5–15.5)
WBC: 8.5 10*3/uL (ref 4.0–10.5)

## 2015-01-10 LAB — MAGNESIUM: MAGNESIUM: 1.9 mg/dL (ref 1.7–2.4)

## 2015-01-10 LAB — BASIC METABOLIC PANEL
ANION GAP: 11 (ref 5–15)
BUN: 31 mg/dL — ABNORMAL HIGH (ref 6–20)
CHLORIDE: 84 mmol/L — AB (ref 101–111)
CO2: 27 mmol/L (ref 22–32)
Calcium: 9 mg/dL (ref 8.9–10.3)
Creatinine, Ser: 2.32 mg/dL — ABNORMAL HIGH (ref 0.61–1.24)
GFR calc Af Amer: 30 mL/min — ABNORMAL LOW (ref >60)
GFR, EST NON AFRICAN AMERICAN: 25 mL/min — AB (ref >60)
GLUCOSE: 147 mg/dL — AB (ref 65–99)
POTASSIUM: 4.1 mmol/L (ref 3.5–5.1)
Sodium: 122 mmol/L — ABNORMAL LOW (ref 135–145)

## 2015-01-10 LAB — I-STAT TROPONIN, ED: Troponin i, poc: 0.03 ng/mL (ref 0.00–0.08)

## 2015-01-10 LAB — PHOSPHORUS: Phosphorus: 3.5 mg/dL (ref 2.5–4.6)

## 2015-01-10 LAB — HEPATIC FUNCTION PANEL
ALK PHOS: 125 U/L (ref 38–126)
ALT: 34 U/L (ref 17–63)
AST: 96 U/L — ABNORMAL HIGH (ref 15–41)
Albumin: 2.6 g/dL — ABNORMAL LOW (ref 3.5–5.0)
BILIRUBIN DIRECT: 0.6 mg/dL — AB (ref 0.1–0.5)
BILIRUBIN INDIRECT: 1.1 mg/dL — AB (ref 0.3–0.9)
Total Bilirubin: 1.7 mg/dL — ABNORMAL HIGH (ref 0.3–1.2)
Total Protein: 6.6 g/dL (ref 6.5–8.1)

## 2015-01-10 LAB — BRAIN NATRIURETIC PEPTIDE: B NATRIURETIC PEPTIDE 5: 304.5 pg/mL — AB (ref 0.0–100.0)

## 2015-01-10 LAB — CBG MONITORING, ED: Glucose-Capillary: 145 mg/dL — ABNORMAL HIGH (ref 65–99)

## 2015-01-10 LAB — TROPONIN I
Troponin I: 0.05 ng/mL — ABNORMAL HIGH (ref <0.031)
Troponin I: 0.06 ng/mL — ABNORMAL HIGH (ref <0.031)

## 2015-01-10 MED ORDER — PANTOPRAZOLE SODIUM 40 MG PO TBEC
40.0000 mg | DELAYED_RELEASE_TABLET | Freq: Every day | ORAL | Status: DC
  Administered 2015-01-11 – 2015-01-12 (×2): 40 mg via ORAL
  Filled 2015-01-10 (×2): qty 1

## 2015-01-10 MED ORDER — ONDANSETRON HCL 4 MG PO TABS: 4.0000 mg | ORAL_TABLET | Freq: Four times a day (QID) | ORAL | Status: DC | PRN

## 2015-01-10 MED ORDER — ACETAMINOPHEN 650 MG RE SUPP
650.0000 mg | Freq: Four times a day (QID) | RECTAL | Status: DC | PRN
Start: 2015-01-10 — End: 2015-01-12

## 2015-01-10 MED ORDER — LEVOTHYROXINE SODIUM 88 MCG PO TABS
88.0000 ug | ORAL_TABLET | Freq: Every day | ORAL | Status: DC
  Administered 2015-01-11 – 2015-01-12 (×2): 88 ug via ORAL
  Filled 2015-01-10 (×3): qty 1

## 2015-01-10 MED ORDER — SODIUM CHLORIDE 0.9 % IV SOLN
Freq: Once | INTRAVENOUS | Status: AC
  Administered 2015-01-10: 19:00:00 via INTRAVENOUS

## 2015-01-10 MED ORDER — SODIUM CHLORIDE 0.9 % IV SOLN
INTRAVENOUS | Status: DC
  Administered 2015-01-10 – 2015-01-11 (×2): via INTRAVENOUS

## 2015-01-10 MED ORDER — INSULIN GLARGINE 100 UNIT/ML ~~LOC~~ SOLN
10.0000 [IU] | Freq: Every day | SUBCUTANEOUS | Status: DC
  Administered 2015-01-11 (×2): 10 [IU] via SUBCUTANEOUS
  Filled 2015-01-10 (×3): qty 0.1

## 2015-01-10 MED ORDER — OXYCODONE HCL 5 MG PO TABS: 5.0000 mg | ORAL_TABLET | ORAL | Status: DC | PRN

## 2015-01-10 MED ORDER — ALUM & MAG HYDROXIDE-SIMETH 200-200-20 MG/5ML PO SUSP: 30.0000 mL | Freq: Four times a day (QID) | ORAL | Status: DC | PRN

## 2015-01-10 MED ORDER — ONDANSETRON HCL 4 MG/2ML IJ SOLN: 4.0000 mg | Freq: Four times a day (QID) | INTRAMUSCULAR | Status: DC | PRN

## 2015-01-10 MED ORDER — FOLIC ACID 1 MG PO TABS
1.0000 mg | ORAL_TABLET | Freq: Every day | ORAL | Status: DC
  Administered 2015-01-11 – 2015-01-12 (×2): 1 mg via ORAL
  Filled 2015-01-10 (×2): qty 1

## 2015-01-10 MED ORDER — ACETAMINOPHEN 325 MG PO TABS
650.0000 mg | ORAL_TABLET | Freq: Four times a day (QID) | ORAL | Status: DC | PRN
  Filled 2015-01-10: qty 2

## 2015-01-10 MED ORDER — HYDROMORPHONE HCL 1 MG/ML IJ SOLN: 0.5000 mg | INTRAMUSCULAR | Status: DC | PRN

## 2015-01-10 MED ORDER — INSULIN ASPART 100 UNIT/ML ~~LOC~~ SOLN: 0.0000 [IU] | Freq: Every day | SUBCUTANEOUS | Status: DC

## 2015-01-10 MED ORDER — IPRATROPIUM-ALBUTEROL 0.5-2.5 (3) MG/3ML IN SOLN
3.0000 mL | Freq: Four times a day (QID) | RESPIRATORY_TRACT | Status: DC
  Administered 2015-01-11 – 2015-01-12 (×7): 3 mL via RESPIRATORY_TRACT
  Filled 2015-01-10 (×11): qty 3

## 2015-01-10 MED ORDER — AMIODARONE HCL 100 MG PO TABS
100.0000 mg | ORAL_TABLET | Freq: Every day | ORAL | Status: DC
  Administered 2015-01-11 – 2015-01-12 (×2): 100 mg via ORAL
  Filled 2015-01-10 (×2): qty 1

## 2015-01-10 MED ORDER — POTASSIUM CHLORIDE CRYS ER 20 MEQ PO TBCR
20.0000 meq | EXTENDED_RELEASE_TABLET | Freq: Two times a day (BID) | ORAL | Status: DC
  Administered 2015-01-11 – 2015-01-12 (×4): 20 meq via ORAL
  Filled 2015-01-10 (×5): qty 1

## 2015-01-10 MED ORDER — INSULIN ASPART 100 UNIT/ML ~~LOC~~ SOLN
0.0000 [IU] | Freq: Three times a day (TID) | SUBCUTANEOUS | Status: DC
  Administered 2015-01-11: 1 [IU] via SUBCUTANEOUS
  Administered 2015-01-11: 2 [IU] via SUBCUTANEOUS
  Administered 2015-01-12 (×2): 1 [IU] via SUBCUTANEOUS

## 2015-01-10 MED ORDER — SODIUM CHLORIDE 0.9 % IJ SOLN
3.0000 mL | Freq: Two times a day (BID) | INTRAMUSCULAR | Status: DC
  Administered 2015-01-11: 3 mL via INTRAVENOUS

## 2015-01-10 NOTE — Telephone Encounter (Signed)
Noted  

## 2015-01-10 NOTE — H&P (Addendum)
[facility] Admission History and Physical       KHYAN OATS BJY:[id number] DOB: 19-Oct-1935 DOA: 01/10/2015  Referring physician: EDP PCP: Tammi Sou, MD  Specialists:   Chief Complaint: Weakness  HPI: [name] is a 79 y.o. male with a history of CAD S/P CABG, Chronic Systolic CHF (EF =08% as of ECHO 08/2013) S/P AICD, Stage III CKD, Chronic Atrial fibrillation ( Not a Coumadin Candidate), Chronic Anemia due to AVMs who presents to the ED due to increased weakness and falls at home for the past 2 days.   He was advised by Home health and Physical Therapy to go to the ED today due to his increased weakness.  He denies any chest pain, or SOB and fevers or chills.    He had a paracentesis on 01/05/2015 and 2.7 liters was removed.   He was evaluated in the ED and found to have hyponatremia at 123, and was referred for admission.      Review of Systems:  Constitutional: No Weight Loss, No Weight Gain, Night Sweats, Fevers, Chills, Dizziness, Light Headedness, Fatigue, +Generalized Weakness HEENT: No Headaches, Difficulty Swallowing,Tooth/Dental Problems,Sore Throat,  No Sneezing, Rhinitis, Ear Ache, Nasal Congestion, or Post Nasal Drip,  Cardio-vascular:  No Chest pain, Orthopnea, PND, Edema in Lower Extremities, Anasarca, Dizziness, Palpitations  Resp: No Dyspnea, No DOE, No Productive Cough, No Non-Productive Cough, No Hemoptysis, No Wheezing.    GI: No Heartburn, Indigestion, Abdominal Pain, Nausea, Vomiting, Diarrhea, Constipation, Hematemesis, Hematochezia, Melena, Change in Bowel Habits,  Loss of Appetite  GU: No Dysuria, No Change in Color of Urine, No Urgency or Urinary Frequency, No Flank pain.  Musculoskeletal: No Joint Pain or Swelling, No Decreased Range of Motion, No Back Pain.  Neurologic: No Syncope, No Seizures, Muscle Weakness, Paresthesia, Vision Disturbance or Loss, No Diplopia, No Vertigo, No Difficulty Walking,  Skin: No Rash or  Lesions. Psych: No Change in Mood or Affect, No Depression or Anxiety, No Memory loss, No Confusion, or Hallucinations   Past Medical History  Diagnosis Date  . VENTRICULAR TACHYCARDIA   . PERIPHERAL VASCULAR DISEASE   . HYPERTENSION     Hypertensive retinopathy-grade II OU  . HYPERCHOLESTEROLEMIA   . CORONARY ARTERY DISEASE     Hx of CABG  . Chronic systolic heart failure     Ischemic cardiomyopathy (01/2014 EF 40-45% with hypokinesis + small area of akinesis; 08/2014, Echo with EF 40-45 percent, PAS 78.  11/2014 ECHO EF 25-30%, inferior akinesis, dilated LV, moderate mitral regurg.  . SEBORRHEIC DERMATITIS   . Chronic renal insufficiency, stage III (moderate)     CrCl 40s-50s  . DIVERTICULOSIS OF COLON   . COPD, severe   . COLONIC POLYPS   . ANXIETY   . ICD (implantable cardiac defibrillator) in place   . Type II diabetes mellitus     +background diabetic retinopathy OU  . Chronic lower back pain   . DJD (degenerative joint disease)     "hands" (05/24/2012)  . Osteoarthritis of finger   . History of atrial flutter   . Thrombus 12/2013    on ICD lead--started anticoag and his cardioversion for atrial flutter was postponed.  . Colon stricture 2015    with features worrisome for mass, also with recent rising CEA level (possible colon cancer)-GI MD= Dr. Jackelyn Hoehn has been declining colonoscopy since that time.  Dr. Liliane Channel impression on re-eval in 2015 was that the area represented chronic post-diverticulitis changes, not malignancy.  . Macular degeneration, age related,  nonexudative     OU  . Chronic blood loss anemia     Transfused 2 U pRBCs April, May, and June (10/26/14) of 2016.  GI w/u has revealed only 2 tiny non-bleeding AVMs in mid small bowel.     Past Surgical History  Procedure Laterality Date  . Coronary artery bypass graft  1990's    CABG X4  . Tonsillectomy  ?1942  . Cardiac defibrillator placement  12/2004    single chamber defibrillator- Medtronic Maxima Dr  Caryl Comes   . Tee without cardioversion N/A 12/22/2013    Procedure: TRANSESOPHAGEAL ECHOCARDIOGRAM (TEE);  Surgeon: Thayer Headings, MD;  Location: [facility];  Service: Cardiovascular;  Laterality: N/A;  . Cardioversion N/A 12/22/2013    Procedure: CARDIOVERSION;  Surgeon: Thayer Headings, MD;  Location: [facility] ENDOSCOPY;  Service: Cardiovascular;  Laterality: N/A;  . Cardiovascular stress test  01/12/13    myocard perf imaging: previous infarct with a moderately reduced EF as was known previously.  No new findings.   . Gastric emptying scan  02/02/14    Normal  . Transthoracic echocardiogram  01/25/14; 04/2014;11/2014    EF 40-45%, diffuse hypokinesis, infero-basilar myocardial akinesis, PA pressure slightly high, valves fine.04/2014-Echo today EF ~40% inferior AK moderate to severe MR. 2016 echo showed EF 25-30%, inferior akinesis, dilated LV, moderate mitral regurg  . Abdominal ultrasound  01/2014    GB sludge, o/w normal  . Ct virtual colonoscopy diagnostic  11/2012; repeat 09/2014    Asymmetric thickening of the rectosigmoid colon worrisome for colon cancer, but further eval by pt's GI MD led to conclusion that this was likely sequela of recurrent diverticulitis.  Pt has refused colonoscopy to evaluate this region.  . Cardiovascular stress test      Lexiscan: There is a medium sized fixed inferior wall defect of moderate severity involving the apical inferior and mid inferior and basal inferoseptal segments, consistent with old inferior MI. No significant reversible ischemia.  EF 34%, inferior wall hypokinesis--intermediate risk scan (ok to do planned procedure)  . Implantable cardioverter defibrillator revision N/A 05/25/2012    Procedure: IMPLANTABLE CARDIOVERTER DEFIBRILLATOR REVISION;  Surgeon: Evans Lance, MD;  Location: [facility] CATH LAB;  Service: Cardiovascular;  Laterality: N/A;  . Right heart catheterization N/A 08/31/2014    Procedure: RIGHT HEART CATH;  Surgeon: Larey Dresser, MD;  Location: [facility]  CATH LAB;  Service: Cardiovascular;  Laterality: N/A;  . Esophagogastroduodenoscopy N/A 09/03/2014    Candida esophagitis + retained gastric contents.  No bleeding. Procedure: ESOPHAGOGASTRODUODENOSCOPY (EGD);  Surgeon: Carol Ada, MD;  Location: [facility] ENDOSCOPY;  Service: Endoscopy;  Laterality: N/A;  . Givens capsule study N/A 10/12/2014    Procedure: GIVENS CAPSULE STUDY;  Surgeon: Irene Shipper, MD;  Location: [facility];  Service: Endoscopy;  Laterality: N/A;      Prior to Admission medications   Medication Sig Start Date End Date Taking? Authorizing Provider  amiodarone (PACERONE) 200 MG tablet Take 0.5 tablets (100 mg total) by mouth daily. 12/26/14  Yes Larey Dresser, MD  Fluticasone-Salmeterol (ADVAIR) 500-50 MCG/DOSE AEPB Inhale 1 puff into the lungs 2 (two) times daily. 04/05/14  Yes Noralee Space, MD  folic acid (FOLVITE) 1 MG tablet Take 1 tablet (1 mg total) by mouth daily. 11/22/14  Yes Tammi Sou, MD  guaiFENesin (MUCINEX) 600 MG 12 hr tablet Take 1 tablet (600 mg total) by mouth 2 (two) times daily. 11/17/14  Yes Irene Pap, NP  Insulin Glargine (LANTUS SOLOSTAR) 100 UNIT/ML  Solostar Pen 10 units SQ qhs Patient taking differently: Inject 10 Units into the skin at bedtime.  02/10/14  Yes Tammi Sou, MD  insulin lispro (HUMALOG KWIKPEN) 100 UNIT/ML KiwkPen Take 5 units in the morning, 5 units at noon, --if blood sugar over 100 and you eat > 50% of meals Patient taking differently: Inject 7-9 Units into the skin 3 (three) times daily. Take 7 units in the morning, 8 units at noon, and 9 units at dinner 10/18/14  Yes Ivan Anchors Love, PA-C  levothyroxine (SYNTHROID, LEVOTHROID) 88 MCG tablet Take 1 tablet (88 mcg total) by mouth daily. 10/25/14  Yes Tammi Sou, MD  magnesium oxide (MAG-OX) 400 (241.3 MG) MG tablet Take 1 tablet (400 mg total) by mouth 3 (three) times daily. 01/09/15  Yes Tammi Sou, MD  pantoprazole (PROTONIX) 40 MG tablet Take 1 tablet (40 mg total)  by mouth daily. 10/18/14  Yes Ivan Anchors Love, PA-C  potassium chloride SA (KLOR-CON M20) 20 MEQ tablet Take 1 tablet (20 mEq total) by mouth 2 (two) times daily. 10/18/14  Yes Ivan Anchors Love, PA-C  pravastatin (PRAVACHOL) 80 MG tablet Take 1 tablet (80 mg total) by mouth at bedtime. 01/02/15  Yes Jolaine Artist, MD  tiotropium (SPIRIVA HANDIHALER) 18 MCG inhalation capsule PLACE ONE CAPSULE INTO INHALER AND  INHALE  DAILY 12/25/14  Yes Noralee Space, MD  torsemide (DEMADEX) 20 MG tablet Take 40mg  am and 20mg  pm every other day ALTERNATING with 20 mg Twice daily every other day 12/26/14  Yes Larey Dresser, MD  Nutritional Supplements (GLUCERNA ADVANCE SHAKE) LIQD Take 1 Bottle by mouth 3 (three) times daily. 12/11/14   Larey Dresser, MD  senna-docusate (SENOKOT-S) 8.6-50 MG per tablet Take 1 tablet by mouth at bedtime as needed for mild constipation. 10/18/14   Bary Leriche, PA-C  spironolactone (ALDACTONE) 50 MG tablet Take 1 tablet (50 mg total) by mouth daily. 01/03/15   Tammi Sou, MD  thiamine (VITAMIN B-1) 100 MG tablet Take 1 tablet (100 mg total) by mouth daily. 11/29/14   Larey Dresser, MD     Allergies  Allergen Reactions  . Niacin Itching    Niaspan     Social History:  Married  reports that he quit smoking about 14 years ago. His smoking use included Cigarettes. He has a 100 pack-year smoking history. He has never used smokeless tobacco. He reports that he drinks alcohol. He reports that he does not use illicit drugs.    Family History  Problem Relation Age of Onset  . Heart attack Father 64  . Heart attack Brother     Vague history       Physical Exam:  GEN:  Pleasant and Mildly ConfusedThin Elderly  79 y.o. Caucasian male examined and in no acute distress; cooperative with exam Filed Vitals:   01/10/15 1314 01/10/15 1316 01/10/15 1645 01/10/15 1938  BP: 120/48  143/60 126/50  Pulse: 63  71 66  Temp: 97.5 F (36.4 C)     TempSrc: Oral     Resp: 20  23 14   Height:  5\' 7"  (1.702 m)     Weight: 59.013 kg (130 lb 1.6 oz) 59.013 kg (130 lb 1.6 oz)    SpO2: 100%  97% 99%   Blood pressure 126/50, pulse 66, temperature 97.5 F (36.4 C), temperature source Oral, resp. rate 14, height 5\' 7"  (1.702 m), weight 59.013 kg (130 lb 1.6 oz), SpO2 99 %.  PSYCH: He is alert and oriented x4; does not appear anxious does not appear depressed; affect is normal HEENT: Normocephalic and Atraumatic, Mucous membranes pink; PERRLA; EOM intact; Fundi:  Benign;  No scleral icterus, Nares: Patent, Oropharynx: Clear, Edentulous with Dentures Present,    Neck:  FROM, No Cervical Lymphadenopathy nor Thyromegaly or Carotid Bruit; No JVD; Breasts:: Not examined CHEST WALL: No tenderness CHEST: Normal respiration, clear to auscultation bilaterally HEART: Irregular rate and rhythm; no murmurs rubs or gallops BACK: No kyphosis or scoliosis; No CVA tenderness ABDOMEN: Positive Bowel Sounds, Soft Non-Tender, No Rebound or Guarding; No Masses, No Organomegaly. Rectal Exam: Not done EXTREMITIES: No Cyanosis, Clubbing, 2+Edema; No Ulcerations. Genitalia: not examined PULSES: 2+ and symmetric SKIN: Normal hydration no rash or ulceration CNS:  Alert and Oriented x 4, No Focal Deficits Vascular: pulses palpable throughout    Labs on Admission:  Basic Metabolic Panel:  Recent Labs Lab 01/10/15 1323 01/10/15 1730  NA 122*  --   K 4.1  --   CL 84*  --   CO2 27  --   GLUCOSE 147*  --   BUN 31*  --   CREATININE 2.32*  --   CALCIUM 9.0  --   MG  --  1.9  PHOS  --  3.5   Liver Function Tests:  Recent Labs Lab 01/10/15 1730  AST 96*  ALT 34  ALKPHOS 125  BILITOT 1.7*  PROT 6.6  ALBUMIN 2.6*   No results for input(s): LIPASE, AMYLASE in the last 168 hours. No results for input(s): AMMONIA in the last 168 hours. CBC:  Recent Labs Lab 01/10/15 1323  WBC 8.5  HGB 10.3*  HCT 30.1*  MCV 83.1  PLT 130*   Cardiac Enzymes:  Recent Labs Lab 01/10/15 1838  TROPONINI  0.05*    BNP (last 3 results)  Recent Labs  10/09/14 2237 12/02/14 1800 01/10/15 1730  BNP 715.8* 262.1* 304.5*    ProBNP (last 3 results)  Recent Labs  01/24/14 1909 02/09/14 0945  PROBNP 6721.0* 2555.0*    CBG:  Recent Labs Lab 01/10/15 1940  GLUCAP 145*    Radiological Exams on Admission: Dg Chest 2 View  01/10/2015   CLINICAL DATA:  Weakness, cough, congestion.  Shortness of breath.  EXAM: CHEST  2 VIEW  COMPARISON:  12/02/2014  FINDINGS: There is hyperinflation of the lungs compatible with COPD. Bilateral calcified pleural plaques again noted, unchanged. No confluent airspace opacities or effusions. No acute bony abnormality. Left AICD rib remains in place, unchanged. Heart is normal size. Prior CABG.  IMPRESSION: Stable bilateral calcified pleural plaques. COPD. No active disease.   Electronically Signed   By: Rolm Baptise M.D.   On: 01/10/2015 13:57   Ct Head Wo Contrast  01/10/2015   CLINICAL DATA:  Recurrent falls.  Generalized weakness.  EXAM: CT HEAD WITHOUT CONTRAST  TECHNIQUE: Contiguous axial images were obtained from the base of the skull through the vertex without intravenous contrast.  COMPARISON:  Head CT scan 11/03/2014 and 10/09/2014  FINDINGS: Mild atrophy is identified. There is no evidence of acute intracranial abnormality including hemorrhage, infarct, mass lesion, mass effect, midline shift or abnormal extra-axial fluid collection. No hydrocephalus or pneumocephalus. The calvarium is intact. Carotid atherosclerosis is noted.  IMPRESSION: No acute abnormality.  Mild atrophy.  Atherosclerosis.   Electronically Signed   By: Inge Rise M.D.   On: 01/10/2015 17:47     EKG: Independently reviewed. Atrial fibrillation rate =66 +LBBB  Assessment/Plan:     79 y.o. male with  Active Problems:   1.     Hyponatremia   Send Urine Osm and Electrolyte   IVFs with NSS   Monitor Na+ Level     2.     Acute encephalopathy- due to #1   Monitor  Neurologic  Status and Na+ levels     3.     Weakness generalized- due to #1        4.     COPD, severe   Duonenbs q 6hrs   Continuous O2 monitoring   O2 PRN        5.     Chronic systolic CHF (congestive heart failure), NYHA class 4-    BNP and ProBNP lower than previous   Strict I/Os   Daily weights   Hold dose of Demadex with Spironolactone and KCl due to #1     6.     Acute on chronic renal failure   Monitor BUN/Cr     7.     CAD in native artery- Mild elevation in Troponin at 0.05.      Check Troponins      8.     Diabetes mellitus type 2, controlled   Continue Lantus Insulin as Glucose tolerates   Check HbA1C in AM   Diabetic Diet     9.    Chronic atrial fibrillation   On Amiodarone Rx   Not a Coumadin Candidate   10.    Chronic blood loss anemia   Monitor Hb Levels   11.    Essential hypertension   Monitor BPs     12.    DVT Prophylaxis   SCDs    Code Status:     FULL CODE      Family Communication:    No Family Present    Disposition Plan:    Inpatient Status        Time spent:  [facility] Pager [phone]   If [place] Please Contact the Day Rounding Team MD for [facility]  If 7PM-7AM, Please Contact Night-Floor Coverage  [email or web address] Password [facility] 01/10/2015, 7:46 PM     ADDENDUM:   Patient was seen and examined on 01/10/2015

## 2015-01-10 NOTE — ED Provider Notes (Signed)
CSN: [id number]     Arrival date & time 01/10/15  1254 History   First MD Initiated Contact with Patient 01/10/15 1641     Chief Complaint  Patient presents with  . Weakness  . Fall  . Shortness of Breath     (Consider location/radiation/quality/duration/timing/severity/associated sxs/prior Treatment) HPI  79 year old male who presents with generalized weakness and frequent falls. He has a history of CAD status post CABG, peripheral vascular disease, prior history of DVT status post AICD, and ischemic cardiomyopathy with an EF of 40%. Reports 1 week of progressive generalized weakness, and during this time has had frequent mechanical falls. Last night he reports that he was so weak that he collapsed on the ground, hitting his head. He did not have any loss of consciousness. Reports that with minimal movement at home he becomes extremely fatigued and short of breath. Denies any lower extremity edema, orthopnea, PND, chest pain, weight gain, or abdominal distention. Reports that last Friday had 2.7 L removed from a paracentesis. His wife reports that he has had decreased appetite during this time. Denies fevers, chills, nausea, vomiting, diarrhea, urinary problems, abdominal pain, cough, or other URI symptoms.  Past Medical History  Diagnosis Date  . VENTRICULAR TACHYCARDIA   . PERIPHERAL VASCULAR DISEASE   . HYPERTENSION     Hypertensive retinopathy-grade II OU  . HYPERCHOLESTEROLEMIA   . CORONARY ARTERY DISEASE     Hx of CABG  . Chronic systolic heart failure     Ischemic cardiomyopathy (01/2014 EF 40-45% with hypokinesis + small area of akinesis; 08/2014, Echo with EF 40-45 percent, PAS 78.  11/2014 ECHO EF 25-30%, inferior akinesis, dilated LV, moderate mitral regurg.  . SEBORRHEIC DERMATITIS   . Chronic renal insufficiency, stage III (moderate)     CrCl 40s-50s  . DIVERTICULOSIS OF COLON   . COPD, severe   . COLONIC POLYPS   . ANXIETY   . Type II diabetes mellitus     +background  diabetic retinopathy OU  . Chronic lower back pain   . DJD (degenerative joint disease)     "hands" (05/24/2012)  . Osteoarthritis of finger   . History of atrial flutter   . Thrombus 12/2013    on ICD lead--started anticoag and his cardioversion for atrial flutter was postponed.  . Colon stricture 2015    with features worrisome for mass, also with recent rising CEA level (possible colon cancer)-GI MD= Dr. Jackelyn Hoehn has been declining colonoscopy since that time.  Dr. Liliane Channel impression on re-eval in 2015 was that the area represented chronic post-diverticulitis changes, not malignancy.  . Macular degeneration, age related, nonexudative     OU  . Chronic blood loss anemia     Transfused 2 U pRBCs April, May, and June (10/26/14) of 2016.  GI w/u has revealed only 2 tiny non-bleeding AVMs in mid small bowel.  Marland Kitchen AICD (automatic cardioverter/defibrillator) present    Past Surgical History  Procedure Laterality Date  . Coronary artery bypass graft  1990's    CABG X4  . Tonsillectomy  ?1942  . Cardiac defibrillator placement  12/2004    single chamber defibrillator- [employer] Maxima Dr Caryl Comes   . Tee without cardioversion N/A 12/22/2013    Procedure: TRANSESOPHAGEAL ECHOCARDIOGRAM (TEE);  Surgeon: Thayer Headings, MD;  Location: [place];  Service: Cardiovascular;  Laterality: N/A;  . Cardioversion N/A 12/22/2013    Procedure: CARDIOVERSION;  Surgeon: Thayer Headings, MD;  Location: [place];  Service: Cardiovascular;  Laterality: N/A;  .  Cardiovascular stress test  01/12/13    myocard perf imaging: previous infarct with a moderately reduced EF as was known previously.  No new findings.   . Gastric emptying scan  02/02/14    Normal  . Transthoracic echocardiogram  01/25/14; 04/2014;11/2014    EF 40-45%, diffuse hypokinesis, infero-basilar myocardial akinesis, PA pressure slightly high, valves fine.04/2014-Echo today EF ~40% inferior AK moderate to severe MR. 2016 echo showed EF 25-30%,  inferior akinesis, dilated LV, moderate mitral regurg  . Abdominal ultrasound  01/2014    GB sludge, o/w normal  . Ct virtual colonoscopy diagnostic  11/2012; repeat 09/2014    Asymmetric thickening of the rectosigmoid colon worrisome for colon cancer, but further eval by pt's GI MD led to conclusion that this was likely sequela of recurrent diverticulitis.  Pt has refused colonoscopy to evaluate this region.  . Cardiovascular stress test      Lexiscan: There is a medium sized fixed inferior wall defect of moderate severity involving the apical inferior and mid inferior and basal inferoseptal segments, consistent with old inferior MI. No significant reversible ischemia.  EF 34%, inferior wall hypokinesis--intermediate risk scan (ok to do planned procedure)  . Implantable cardioverter defibrillator revision N/A 05/25/2012    Procedure: IMPLANTABLE CARDIOVERTER DEFIBRILLATOR REVISION;  Surgeon: Evans Lance, MD;  Location: [facility] CATH LAB;  Service: Cardiovascular;  Laterality: N/A;  . Right heart catheterization N/A 08/31/2014    Procedure: RIGHT HEART CATH;  Surgeon: Larey Dresser, MD;  Location: [facility] CATH LAB;  Service: Cardiovascular;  Laterality: N/A;  . Esophagogastroduodenoscopy N/A 09/03/2014    Candida esophagitis + retained gastric contents.  No bleeding. Procedure: ESOPHAGOGASTRODUODENOSCOPY (EGD);  Surgeon: Carol Ada, MD;  Location: [facility] ENDOSCOPY;  Service: Endoscopy;  Laterality: N/A;  . Givens capsule study N/A 10/12/2014    Procedure: GIVENS CAPSULE STUDY;  Surgeon: Irene Shipper, MD;  Location: [place];  Service: Endoscopy;  Laterality: N/A;   Family History  Problem Relation Age of Onset  . Heart attack Father 67  . Heart attack Brother     Vague history   Social History  Substance Use Topics  . Smoking status: Former Smoker -- 2.00 packs/day for 50 years    Types: Cigarettes    Quit date: 06/24/2000  . Smokeless tobacco: Never Used  . Alcohol Use: 0.0 oz/week    0 Standard  drinks or equivalent per week     Comment: 05/24/2012 "used to drink alot; quit > 10 yr ago"    Review of Systems 10/14 systems reviewed and are negative other than those stated in the HPI    Allergies  Niacin  Home Medications   Prior to Admission medications   Medication Sig Start Date End Date Taking? Authorizing Provider  amiodarone (PACERONE) 200 MG tablet Take 0.5 tablets (100 mg total) by mouth daily. 12/26/14  Yes Larey Dresser, MD  Fluticasone-Salmeterol (ADVAIR) 500-50 MCG/DOSE AEPB Inhale 1 puff into the lungs 2 (two) times daily. 04/05/14  Yes Noralee Space, MD  folic acid (FOLVITE) 1 MG tablet Take 1 tablet (1 mg total) by mouth daily. 11/22/14  Yes Tammi Sou, MD  guaiFENesin (MUCINEX) 600 MG 12 hr tablet Take 1 tablet (600 mg total) by mouth 2 (two) times daily. 11/17/14  Yes Irene Pap, NP  Insulin Glargine (LANTUS SOLOSTAR) 100 UNIT/ML Solostar Pen 10 units SQ qhs Patient taking differently: Inject 10 Units into the skin at bedtime.  02/10/14  Yes Tammi Sou, MD  insulin lispro (HUMALOG KWIKPEN) 100 UNIT/ML KiwkPen Take 5 units in the morning, 5 units at noon, --if blood sugar over 100 and you eat > 50% of meals Patient taking differently: Inject 7-9 Units into the skin 3 (three) times daily. Take 7 units in the morning, 8 units at noon, and 9 units at dinner 10/18/14  Yes Ivan Anchors Love, PA-C  levothyroxine (SYNTHROID, LEVOTHROID) 88 MCG tablet Take 1 tablet (88 mcg total) by mouth daily. 10/25/14  Yes Tammi Sou, MD  magnesium oxide (MAG-OX) 400 (241.3 MG) MG tablet Take 1 tablet (400 mg total) by mouth 3 (three) times daily. 01/09/15  Yes Tammi Sou, MD  pantoprazole (PROTONIX) 40 MG tablet Take 1 tablet (40 mg total) by mouth daily. 10/18/14  Yes Ivan Anchors Love, PA-C  potassium chloride SA (KLOR-CON M20) 20 MEQ tablet Take 1 tablet (20 mEq total) by mouth 2 (two) times daily. 10/18/14  Yes Ivan Anchors Love, PA-C  pravastatin (PRAVACHOL) 80 MG tablet Take  1 tablet (80 mg total) by mouth at bedtime. 01/02/15  Yes Jolaine Artist, MD  tiotropium (SPIRIVA HANDIHALER) 18 MCG inhalation capsule PLACE ONE CAPSULE INTO INHALER AND  INHALE  DAILY 12/25/14  Yes Noralee Space, MD  torsemide (DEMADEX) 20 MG tablet Take 40mg  am and 20mg  pm every other day ALTERNATING with 20 mg Twice daily every other day 12/26/14  Yes Larey Dresser, MD  Nutritional Supplements (GLUCERNA ADVANCE SHAKE) LIQD Take 1 Bottle by mouth 3 (three) times daily. 12/11/14   Larey Dresser, MD  senna-docusate (SENOKOT-S) 8.6-50 MG per tablet Take 1 tablet by mouth at bedtime as needed for mild constipation. 10/18/14   Bary Leriche, PA-C  spironolactone (ALDACTONE) 50 MG tablet Take 1 tablet (50 mg total) by mouth daily. 01/03/15   Tammi Sou, MD  thiamine (VITAMIN B-1) 100 MG tablet Take 1 tablet (100 mg total) by mouth daily. 11/29/14   Larey Dresser, MD   BP 130/56 mmHg  Pulse 73  Temp(Src) 97.7 F (36.5 C) (Oral)  Resp 22  Ht 5\' 7"  (1.702 m)  Wt 129 lb 1.6 oz (58.559 kg)  BMI 20.22 kg/m2  SpO2 99% Physical Exam Physical Exam  Nursing note and vitals reviewed. Constitutional: Chronically ill appearing, non-toxic, and in no acute distress Head: Normocephalic and atraumatic.  Mouth/Throat: Oropharynx is clear and moist.  Neck: Normal range of motion. Neck supple.  Cardiovascular: Normal rate and regular rhythm.  No edema. No JVD. Pulmonary/Chest: Effort normal and breath sounds normal.  Abdominal: Soft. There is no tenderness. There is no rebound and no guarding.  Musculoskeletal: Normal range of motion.  Neurological: Alert, no facial droop, fluent speech, moves all extremities symmetrically Skin: Skin is warm and dry.  Psychiatric: Cooperative  ED Course  Procedures (including critical care time) Labs Review Labs Reviewed  BASIC METABOLIC PANEL - Abnormal; Notable for the following:    Sodium 122 (*)    Chloride 84 (*)    Glucose, Bld 147 (*)    BUN 31 (*)     Creatinine, Ser 2.32 (*)    GFR calc non Af Amer 25 (*)    GFR calc Af Amer 30 (*)    All other components within normal limits  CBC - Abnormal; Notable for the following:    RBC 3.62 (*)    Hemoglobin 10.3 (*)    HCT 30.1 (*)    RDW 19.9 (*)    Platelets 130 (*)  All other components within normal limits  BRAIN NATRIURETIC PEPTIDE - Abnormal; Notable for the following:    B Natriuretic Peptide 304.5 (*)    All other components within normal limits  URINALYSIS W MICROSCOPIC - Abnormal; Notable for the following:    Casts HYALINE CASTS (*)    All other components within normal limits  HEPATIC FUNCTION PANEL - Abnormal; Notable for the following:    Albumin 2.6 (*)    AST 96 (*)    Total Bilirubin 1.7 (*)    Bilirubin, Direct 0.6 (*)    Indirect Bilirubin 1.1 (*)    All other components within normal limits  TROPONIN I - Abnormal; Notable for the following:    Troponin I 0.05 (*)    All other components within normal limits  TROPONIN I - Abnormal; Notable for the following:    Troponin I 0.06 (*)    All other components within normal limits  CBG MONITORING, ED - Abnormal; Notable for the following:    Glucose-Capillary 145 (*)    All other components within normal limits  PHOSPHORUS  MAGNESIUM  TROPONIN I  TROPONIN I  OSMOLALITY, URINE  NA AND K (SODIUM & POTASSIUM), RAND UR  BASIC METABOLIC PANEL  CBC  HEMOGLOBIN A1C  I-STAT TROPOININ, ED  Randolm Idol, ED    Imaging Review Dg Chest 2 View  01/10/2015   CLINICAL DATA:  Weakness, cough, congestion.  Shortness of breath.  EXAM: CHEST  2 VIEW  COMPARISON:  12/02/2014  FINDINGS: There is hyperinflation of the lungs compatible with COPD. Bilateral calcified pleural plaques again noted, unchanged. No confluent airspace opacities or effusions. No acute bony abnormality. Left AICD rib remains in place, unchanged. Heart is normal size. Prior CABG.  IMPRESSION: Stable bilateral calcified pleural plaques. COPD. No active  disease.   Electronically Signed   By: Rolm Baptise M.D.   On: 01/10/2015 13:57   Ct Head Wo Contrast  01/10/2015   CLINICAL DATA:  Recurrent falls.  Generalized weakness.  EXAM: CT HEAD WITHOUT CONTRAST  TECHNIQUE: Contiguous axial images were obtained from the base of the skull through the vertex without intravenous contrast.  COMPARISON:  Head CT scan 11/03/2014 and 10/09/2014  FINDINGS: Mild atrophy is identified. There is no evidence of acute intracranial abnormality including hemorrhage, infarct, mass lesion, mass effect, midline shift or abnormal extra-axial fluid collection. No hydrocephalus or pneumocephalus. The calvarium is intact. Carotid atherosclerosis is noted.  IMPRESSION: No acute abnormality.  Mild atrophy.  Atherosclerosis.   Electronically Signed   By: Inge Rise M.D.   On: 01/10/2015 17:47   I have personally reviewed and evaluated these images and lab results as part of my medical decision-making.   EKG Interpretation   Date/Time:  Wednesday January 10 2015 13:09:28 EDT Ventricular Rate:  66 PR Interval:  190 QRS Duration: 176 QT Interval:  496 QTC Calculation: 519 R Axis:   117 Text Interpretation:  Sinus rhythm with Premature atrial complexes Right  axis deviation Left bundle branch block Abnormal ECG since last tracing no  significant change Confirmed by MILLER  MD, BRIAN ([id number]) on 01/10/2015  1:10:45 PM      MDM   Final diagnoses:  Hyponatremia  Generalized weakness    79 year old male who presents with generalized weakness, likely secondary to hyponatremia. He is nontoxic in no acute distress on presentation. Vital signs are non-concerning in the ED. He does not appear fluid overloaded on exam. Her basic blood work concerning for hyponatremia with  a sodium of 122, which is the likely etiology of his symptoms. At baseline he is usually around 130. No other major electrolyte, infectious, or metabolic derangements are noted on his blood work. His EKG is  unchanged from prior, and nonischemic. BNP and troponin are unremarkable. A chest x-ray shows no acute cardiopulmonary processes. Hyponatremia likely more hypovolemic in nature, and he is started on normal saline at 100 mL/hour. Discussed with try a hospitalist and admitted for ongoing care.    Forde Dandy, MD 01/11/15 [phone]

## 2015-01-10 NOTE — ED Notes (Signed)
Attempted to call report

## 2015-01-10 NOTE — ED Notes (Signed)
Pt reports multiple falls and generalized weakness. Pt reports hx of ascites and having fluid drained. Pt reports SOB and productive cough as well.

## 2015-01-10 NOTE — ED Notes (Signed)
Dr Arnoldo Morale, MD at bedside.

## 2015-01-10 NOTE — ED Notes (Signed)
Pt CBG, 145. Nurse was notified.

## 2015-01-11 DIAGNOSIS — R531 Weakness: Secondary | ICD-10-CM

## 2015-01-11 DIAGNOSIS — I482 Chronic atrial fibrillation: Secondary | ICD-10-CM

## 2015-01-11 DIAGNOSIS — G9340 Encephalopathy, unspecified: Secondary | ICD-10-CM

## 2015-01-11 DIAGNOSIS — N189 Chronic kidney disease, unspecified: Secondary | ICD-10-CM

## 2015-01-11 DIAGNOSIS — E871 Hypo-osmolality and hyponatremia: Principal | ICD-10-CM

## 2015-01-11 DIAGNOSIS — N179 Acute kidney failure, unspecified: Secondary | ICD-10-CM

## 2015-01-11 DIAGNOSIS — E119 Type 2 diabetes mellitus without complications: Secondary | ICD-10-CM

## 2015-01-11 LAB — GLUCOSE, CAPILLARY
GLUCOSE-CAPILLARY: 153 mg/dL — AB (ref 65–99)
Glucose-Capillary: 129 mg/dL — ABNORMAL HIGH (ref 65–99)
Glucose-Capillary: 142 mg/dL — ABNORMAL HIGH (ref 65–99)
Glucose-Capillary: 148 mg/dL — ABNORMAL HIGH (ref 65–99)

## 2015-01-11 LAB — BASIC METABOLIC PANEL WITH GFR
Anion gap: 11 (ref 5–15)
BUN: 30 mg/dL — ABNORMAL HIGH (ref 6–20)
CO2: 24 mmol/L (ref 22–32)
Calcium: 8.6 mg/dL — ABNORMAL LOW (ref 8.9–10.3)
Chloride: 93 mmol/L — ABNORMAL LOW (ref 101–111)
Creatinine, Ser: 2.17 mg/dL — ABNORMAL HIGH (ref 0.61–1.24)
GFR calc Af Amer: 32 mL/min — ABNORMAL LOW
GFR calc non Af Amer: 28 mL/min — ABNORMAL LOW
Glucose, Bld: 140 mg/dL — ABNORMAL HIGH (ref 65–99)
Potassium: 4.3 mmol/L (ref 3.5–5.1)
Sodium: 128 mmol/L — ABNORMAL LOW (ref 135–145)

## 2015-01-11 LAB — MRSA PCR SCREENING: MRSA by PCR: NEGATIVE

## 2015-01-11 LAB — CBC
HEMATOCRIT: 28.2 % — AB (ref 39.0–52.0)
Hemoglobin: 9.5 g/dL — ABNORMAL LOW (ref 13.0–17.0)
MCH: 28 pg (ref 26.0–34.0)
MCHC: 33.7 g/dL (ref 30.0–36.0)
MCV: 83.2 fL (ref 78.0–100.0)
PLATELETS: 123 10*3/uL — AB (ref 150–400)
RBC: 3.39 MIL/uL — ABNORMAL LOW (ref 4.22–5.81)
RDW: 19.9 % — AB (ref 11.5–15.5)
WBC: 7.4 10*3/uL (ref 4.0–10.5)

## 2015-01-11 LAB — TROPONIN I
TROPONIN I: 0.06 ng/mL — AB (ref <0.031)
TROPONIN I: 0.05 ng/mL — AB (ref <0.031)

## 2015-01-11 NOTE — Progress Notes (Signed)
Utilization review completed. Freddi Forster, RN, BSN. 

## 2015-01-11 NOTE — Progress Notes (Signed)
Patient Demographics  [name], is a 79 y.o. male, DOB - 1936/01/03, GBT:[id number]  Admit date - 01/10/2015   Admitting Physician Theressa Millard, MD  Outpatient Primary MD for the patient is Tammi Sou, MD  LOS - 1   Chief Complaint  Patient presents with  . Weakness  . Fall  . Shortness of Breath         Subjective:   [name] today has, No headache, No chest pain, No abdominal pain - No Nausea, No new weakness tingling or numbness, No Cough - SOB.  Assessment & Plan    Active Problems:   Essential hypertension   COPD, severe   Chronic systolic CHF (congestive heart failure), NYHA class 4   Acute on chronic renal failure   CAD in native artery   Diabetes mellitus type 2, controlled   Chronic atrial fibrillation   Hyponatremia   Acute encephalopathy   Weakness generalized   Chronic blood loss anemia  Hyponatremia - Likely due to volume depletion from dehydration, worsening renal failure. - Improving with IV normal saline - Check BMP in a.m., follow on urine osmolality and electrolytes  Acute encephalopathy - Secondary to hypernatremia, back to baseline  Generalized weakness - Secondary to hypernatremia and dehydration - PT consult  COPD - No active wheezing, continue with nebs, and oxygen as needed  Chronic systolic CHF - Daily weight, strict ins and outs, hold Demadex and spironolactone secondary to hypernatremia - No evidence of volume overload  Acute on chronic renal failure - Improving with hydration, continue with IV fluids, hold Demadex and spironolactone.  Coronary artery disease - Denies any chest pain or shortness of breath, mild elevation of troponin most likely related to his chronic kidney disease at baseline, troponins has been trended and stable.  Chronic A. Fib - On amiodarone - Not anti-coagulation candidate  Diabetes  mellitus  - Continue with Lantus and insulin sliding scale   HTN - monitor  Hypothyroid - on synthroid  Code Status: Full  Family Communication: none at bedside  Disposition Plan: Home when stable   Procedures  None   Consults   None   Medications  Scheduled Meds: . amiodarone  100 mg Oral Daily  . folic acid  1 mg Oral Daily  . insulin aspart  0-5 Units Subcutaneous QHS  . insulin aspart  0-9 Units Subcutaneous TID WC  . insulin glargine  10 Units Subcutaneous QHS  . ipratropium-albuterol  3 mL Nebulization Q6H  . levothyroxine  88 mcg Oral QAC breakfast  . pantoprazole  40 mg Oral Daily  . potassium chloride SA  20 mEq Oral BID  . sodium chloride  3 mL Intravenous Q12H   Continuous Infusions: . sodium chloride 75 mL/hr at 01/10/15 2100   PRN Meds:.acetaminophen **OR** acetaminophen, alum & mag hydroxide-simeth, HYDROmorphone (DILAUDID) injection, ondansetron **OR** ondansetron (ZOFRAN) IV, oxyCODONE  DVT Prophylaxis  SCDs   Lab Results  Component Value Date   PLT 123* 01/11/2015    Antibiotics    Anti-infectives    None          Objective:   Filed Vitals:   01/11/15 0814 01/11/15 0852 01/11/15 1357 01/11/15 1404  BP: 113/44  127/47   Pulse: 64  66   Temp: 97.8 F (36.6 C)     TempSrc: Oral     Resp: 18  18   Height:      Weight: 58.196 kg (128 lb 4.8 oz)     SpO2: 97% 98% 99% 98%    Wt Readings from Last 3 Encounters:  01/11/15 58.196 kg (128 lb 4.8 oz)  01/02/15 64.864 kg (143 lb)  01/01/15 64.048 kg (141 lb 3.2 oz)     Intake/Output Summary (Last 24 hours) at 01/11/15 1538 Last data filed at 01/11/15 1531  Gross per 24 hour  Intake 2256.67 ml  Output   1550 ml  Net 706.67 ml     Physical Exam  Awake Alert, Oriented .AT,PERRAL Supple Neck,No JVD,  Symmetrical Chest wall movement, Good air movement bilaterally,  No Gallops,Rubs or new Murmurs, No Parasternal Heave +ve B.Sounds, Abd Soft, No tenderness, No  organomegaly appriciated No Cyanosis, Clubbing or edema, No new Rash or bruise     Data Review   Micro Results Recent Results (from the past 240 hour(s))  MRSA PCR Screening     Status: None   Collection Time: 01/11/15  5:56 AM  Result Value Ref Range Status   MRSA by PCR NEGATIVE NEGATIVE Final    Comment:        The GeneXpert MRSA Assay (FDA approved for NASAL specimens only), is one component of a comprehensive MRSA colonization surveillance program. It is not intended to diagnose MRSA infection nor to guide or monitor treatment for MRSA infections.     Radiology Reports Dg Chest 2 View  01/10/2015   CLINICAL DATA:  Weakness, cough, congestion.  Shortness of breath.  EXAM: CHEST  2 VIEW  COMPARISON:  12/02/2014  FINDINGS: There is hyperinflation of the lungs compatible with COPD. Bilateral calcified pleural plaques again noted, unchanged. No confluent airspace opacities or effusions. No acute bony abnormality. Left AICD rib remains in place, unchanged. Heart is normal size. Prior CABG.  IMPRESSION: Stable bilateral calcified pleural plaques. COPD. No active disease.   Electronically Signed   By: Rolm Baptise M.D.   On: 01/10/2015 13:57   Ct Head Wo Contrast  01/10/2015   CLINICAL DATA:  Recurrent falls.  Generalized weakness.  EXAM: CT HEAD WITHOUT CONTRAST  TECHNIQUE: Contiguous axial images were obtained from the base of the skull through the vertex without intravenous contrast.  COMPARISON:  Head CT scan 11/03/2014 and 10/09/2014  FINDINGS: Mild atrophy is identified. There is no evidence of acute intracranial abnormality including hemorrhage, infarct, mass lesion, mass effect, midline shift or abnormal extra-axial fluid collection. No hydrocephalus or pneumocephalus. The calvarium is intact. Carotid atherosclerosis is noted.  IMPRESSION: No acute abnormality.  Mild atrophy.  Atherosclerosis.   Electronically Signed   By: Inge Rise M.D.   On: 01/10/2015 17:47   US  Paracentesis  01/05/2015   INDICATION: CHF, recurrent ascites. Request is made for therapeutic paracentesis.  EXAM: ULTRASOUND-GUIDED THERAPEUTIC PARACENTESIS  COMPARISON:  Prior paracentesis on  11/28/2014  MEDICATIONS: None.  COMPLICATIONS: None immediate  TECHNIQUE: Informed written consent was obtained from the patient after a discussion of the risks, benefits and alternatives to treatment. A timeout was performed prior to the initiation of the procedure.  Initial ultrasound scanning demonstrates a moderate amount of ascites within the right lower abdominal quadrant. The right lower abdomen was prepped and draped in the usual sterile fashion. 1% lidocaine was used for local anesthesia. Under direct ultrasound guidance, a 19 gauge, 10-cm, Yueh catheter  was introduced. An ultrasound image was saved for documentation purposed. The paracentesis was performed. The catheter was removed and a dressing was applied. The patient tolerated the procedure well without immediate post procedural complication.  FINDINGS: A total of approximately 2.7 liters of yellow fluid was removed.  IMPRESSION: Successful ultrasound-guided therapeutic paracentesis yielding 2.7 liters of peritoneal fluid.  Read by: Rowe Zykeem, PA-C   Electronically Signed   By: Aletta Edouard M.D.   On: 01/05/2015 15:24     CBC  Recent Labs Lab 01/10/15 1323 01/11/15 0820  WBC 8.5 7.4  HGB 10.3* 9.5*  HCT 30.1* 28.2*  PLT 130* 123*  MCV 83.1 83.2  MCH 28.5 28.0  MCHC 34.2 33.7  RDW 19.9* 19.9*    Chemistries   Recent Labs Lab 01/10/15 1323 01/10/15 1730 01/11/15 0820  NA 122*  --  128*  K 4.1  --  4.3  CL 84*  --  93*  CO2 27  --  24  GLUCOSE 147*  --  140*  BUN 31*  --  30*  CREATININE 2.32*  --  2.17*  CALCIUM 9.0  --  8.6*  MG  --  1.9  --   AST  --  96*  --   ALT  --  34  --   ALKPHOS  --  125  --   BILITOT  --  1.7*  --     ------------------------------------------------------------------------------------------------------------------ estimated creatinine clearance is 23.5 mL/min (by C-G formula based on Cr of 2.17). ------------------------------------------------------------------------------------------------------------------ No results for input(s): HGBA1C in the last 72 hours. ------------------------------------------------------------------------------------------------------------------ No results for input(s): CHOL, HDL, LDLCALC, TRIG, CHOLHDL, LDLDIRECT in the last 72 hours. ------------------------------------------------------------------------------------------------------------------ No results for input(s): TSH, T4TOTAL, T3FREE, THYROIDAB in the last 72 hours.  Invalid input(s): FREET3 ------------------------------------------------------------------------------------------------------------------ No results for input(s): VITAMINB12, FOLATE, FERRITIN, TIBC, IRON, RETICCTPCT in the last 72 hours.  Coagulation profile No results for input(s): INR, PROTIME in the last 168 hours.  No results for input(s): DDIMER in the last 72 hours.  Cardiac Enzymes  Recent Labs Lab 01/10/15 2058 01/11/15 0231 01/11/15 0820  TROPONINI 0.06* 0.06* 0.05*   ------------------------------------------------------------------------------------------------------------------ Invalid input(s): POCBNP     Time Spent in minutes   30 minutes    ELGERGAWY, DAWOOD M.D on 01/11/2015 at 3:38 PM  Between 7am to 7pm - Pager - [phone]  After 7pm go to [email or web address] - password [facility]  [facility]   Office  [phone]

## 2015-01-11 NOTE — Evaluation (Signed)
Physical Therapy Evaluation Patient Details Name: [name] MRN: [id number] DOB: [birth year] Today's Date: 01/11/2015   History of Present Illness  79 y.o. male admitted for hyponatremia, Acute encephalopathy, COPD, generalized weakness, Chronic systolic CHF, Acute on chronic renal failure, and Chronic A. Fib.  Clinical Impression  Pt admitted with the above complications. Pt currently with functional limitations due to the deficits listed below (see PT Problem List). Ambulating apparently near his baseline which is with an assistive device for support. Has been receiving physical therapy at home and progressing well. SpO2 down to 90% on room air with minimal dyspnea; wife reports typically stays at 98% at home. Pt has adequate support at home from family. Will follow and progress while admitted; [name] will benefit from continued HHPT at d/c.      Follow Up Recommendations Home health PT;Supervision/Assistance - 24 hour    Equipment Recommendations  None recommended by PT    Recommendations for Other Services       Precautions / Restrictions Precautions Precautions: Fall Restrictions Weight Bearing Restrictions: No      Mobility  Bed Mobility Overal bed mobility: Modified Independent             General bed mobility comments: extra time  Transfers Overall transfer level: Needs assistance Equipment used: None Transfers: Sit to/from Stand Sit to Stand: Min guard         General transfer comment: close guard for safety. Mildy unstable, improves once RW introduced for support.  Ambulation/Gait Ambulation/Gait assistance: Min guard Ambulation Distance (Feet): 175 Feet Assistive device: Rolling walker (2 wheeled) Gait Pattern/deviations: Step-through pattern;Decreased stride length;Trendelenburg;Trunk flexed   Gait velocity interpretation: at or above normal speed for age/gender General Gait Details: Close guard for safety. Lacks terminal knee  extension on Rt and knee remains in flexed position throughout gait cycle. Pt required 3 standing rest breaks to complete distance. SpO2 upon returning to room 90% on room air. 2/4 dyspnea, improved with cues for pursed lip breathing and rest. Minor instability, and VC for walker control with turns. No overt loss of balance or buckling noted.  Stairs            Wheelchair Mobility    Modified Rankin (Stroke Patients Only)       Balance Overall balance assessment: Needs assistance Sitting-balance support: No upper extremity supported;Feet supported Sitting balance-Leahy Scale: Good     Standing balance support: No upper extremity supported Standing balance-Leahy Scale: Fair                               Pertinent Vitals/Pain Pain Assessment: No/denies pain    Home Living Family/patient expects to be discharged to:: Private residence Living Arrangements: Spouse/significant other Available Help at Discharge: Family;Available 24 hours/day Type of Home: House Home Access: Level entry     Home Layout: One level Home Equipment: Walker - 2 wheels;Walker - 4 wheels;Shower seat - built in      Prior Function Level of Independence: Independent with assistive device(s)         Comments: rollator for ambulation     Hand Dominance   Dominant Hand: Right    Extremity/Trunk Assessment   Upper Extremity Assessment: Defer to OT evaluation           Lower Extremity Assessment: Generalized weakness         Communication   Communication: No difficulties  Cognition Arousal/Alertness: Awake/alert Behavior During Therapy: [facility]  for tasks assessed/performed Overall Cognitive Status: Within Functional Limits for tasks assessed                      General Comments General comments (skin integrity, edema, etc.): Wife present in room. States pt has been progressing with with PT at home. Discussed d/c recommendations and safety with mobility at  home    Exercises        Assessment/Plan    PT Assessment Patient needs continued PT services  PT Diagnosis Difficulty walking;Abnormality of gait;Generalized weakness   PT Problem List Decreased strength;Decreased range of motion;Decreased activity tolerance;Decreased balance;Decreased mobility;Decreased knowledge of use of DME;Cardiopulmonary status limiting activity  PT Treatment Interventions DME instruction;Gait training;Functional mobility training;Therapeutic activities;Therapeutic exercise;Balance training;Neuromuscular re-education;Patient/family education   PT Goals (Current goals can be found in the Care Plan section) Acute Rehab PT Goals Patient Stated Goal: Go home PT Goal Formulation: With patient Time For Goal Achievement: 01/25/15 Potential to Achieve Goals: Good    Frequency Min 3X/week   Barriers to discharge        Co-evaluation               End of Session Equipment Utilized During Treatment: Gait belt Activity Tolerance: Patient tolerated treatment well Patient left: in bed;with call bell/phone within reach;with bed alarm set;with family/visitor present Nurse Communication: Mobility status         Time: 1531-1556 PT Time Calculation (min) (ACUTE ONLY): 25 min   Charges:   PT Evaluation $Initial PT Evaluation Tier I: 1 Procedure PT Treatments $Gait Training: 8-22 mins   PT G CodesEllouise [name] 01/11/2015, 4:27 PM  [name] [name], [place]

## 2015-01-12 DIAGNOSIS — J449 Chronic obstructive pulmonary disease, unspecified: Secondary | ICD-10-CM

## 2015-01-12 LAB — BASIC METABOLIC PANEL
ANION GAP: 7 (ref 5–15)
BUN: 21 mg/dL — ABNORMAL HIGH (ref 6–20)
CALCIUM: 8.4 mg/dL — AB (ref 8.9–10.3)
CO2: 24 mmol/L (ref 22–32)
CREATININE: 2.04 mg/dL — AB (ref 0.61–1.24)
Chloride: 95 mmol/L — ABNORMAL LOW (ref 101–111)
GFR, EST AFRICAN AMERICAN: 34 mL/min — AB (ref >60)
GFR, EST NON AFRICAN AMERICAN: 30 mL/min — AB (ref >60)
Glucose, Bld: 128 mg/dL — ABNORMAL HIGH (ref 65–99)
Potassium: 4.5 mmol/L (ref 3.5–5.1)
SODIUM: 126 mmol/L — AB (ref 135–145)

## 2015-01-12 LAB — HEMOGLOBIN A1C
Hgb A1c MFr Bld: 6 % — ABNORMAL HIGH (ref 4.8–5.6)
Mean Plasma Glucose: 126 mg/dL

## 2015-01-12 LAB — GLUCOSE, CAPILLARY
Glucose-Capillary: 128 mg/dL — ABNORMAL HIGH (ref 65–99)
Glucose-Capillary: 136 mg/dL — ABNORMAL HIGH (ref 65–99)

## 2015-01-12 MED ORDER — POTASSIUM CHLORIDE CRYS ER 20 MEQ PO TBCR: 20.0000 meq | EXTENDED_RELEASE_TABLET | Freq: Every day | ORAL | Status: DC

## 2015-01-12 MED ORDER — TORSEMIDE 20 MG PO TABS: ORAL_TABLET | ORAL | Status: DC

## 2015-01-12 MED ORDER — IPRATROPIUM-ALBUTEROL 0.5-2.5 (3) MG/3ML IN SOLN: 3.0000 mL | Freq: Two times a day (BID) | RESPIRATORY_TRACT | Status: DC

## 2015-01-12 NOTE — Progress Notes (Signed)
Physical Therapy Treatment Patient Details Name: [name] MRN: [id number] DOB: [birth year] Today's Date: 2015-02-05    History of Present Illness 79 y.o. male admitted for hyponatremia, Acute encephalopathy, COPD, generalized weakness, Chronic systolic CHF, Acute on chronic renal failure, and Chronic A. Fib.    PT Comments    Pt moving well with increased activity tolerance today. Sats 95% on RA with HR 63 during gait. Pt encouraged to continue mobility and HEP with continued recommendation for HHPT.   Follow Up Recommendations  Home health PT;Supervision for mobility/OOB     Equipment Recommendations  None recommended by PT    Recommendations for Other Services       Precautions / Restrictions Precautions Precautions: Fall    Mobility  Bed Mobility Overal bed mobility: Needs Assistance Bed Mobility: Supine to Sit     Supine to sit: Min assist     General bed mobility comments: pt requesting assist with LUE due to pain RUE after IV  Transfers       Sit to Stand: Supervision         General transfer comment: cues for hand placement  Ambulation/Gait Ambulation/Gait assistance: Supervision Ambulation Distance (Feet): 350 Feet Assistive device: Rolling walker (2 wheeled) Gait Pattern/deviations: Step-through pattern;Decreased stride length;Trunk flexed   Gait velocity interpretation: Below normal speed for age/gender General Gait Details: cues for posture and position in RW as well as cues to avoid obstacles. 2 15' trials and and from toilet as well   Financial trader Rankin (Stroke Patients Only)       Balance                                    Cognition Arousal/Alertness: Awake/alert Behavior During Therapy: WFL for tasks assessed/performed Overall Cognitive Status: Within Functional Limits for tasks assessed                      Exercises General Exercises - Lower  Extremity Long Arc Quad: AROM;Seated;Both;20 reps Hip ABduction/ADduction: AROM;Seated;Both;20 reps Hip Flexion/Marching: AROM;Seated;Both;20 reps    General Comments        Pertinent Vitals/Pain Pain Assessment: No/denies pain    Home Living                      Prior Function            PT Goals (current goals can now be found in the care plan section) Progress towards PT goals: Progressing toward goals    Frequency       PT Plan Current plan remains appropriate    Co-evaluation             End of Session   Activity Tolerance: Patient tolerated treatment well Patient left: in chair;with call bell/phone within reach     Time: 1135-1200 PT Time Calculation (min) (ACUTE ONLY): 25 min  Charges:  $Gait Training: 8-22 mins $Therapeutic Exercise: 8-22 mins                    G Codes:      Melford Aase 02-05-15, 12:02 PM Elwyn Reach, [place]

## 2015-01-12 NOTE — Discharge Summary (Signed)
[name], is a 79 y.o. male  DOB 21-Jun-1935  MRN [id number].  Admission date:  01/10/2015  Admitting Physician  Theressa Millard, MD  Discharge Date:  01/12/2015   Primary MD  Tammi Sou, MD  Recommendations for primary care physician for things to follow:  - Please check BMP during next visit - Patient was discharged off torsemide, can be resumed on 40 mg of torsemide daily insulin weight is more than 137  pounds , per CHF consult.   Admission Diagnosis  Hyponatremia [E87.1] Generalized weakness [R53.1]   Discharge Diagnosis  Hyponatremia [E87.1] Generalized weakness [R53.1]    Active Problems:   Essential hypertension   COPD, severe   Chronic systolic CHF (congestive heart failure), NYHA class 4   Acute on chronic renal failure   CAD in native artery   Diabetes mellitus type 2, controlled   Chronic atrial fibrillation   Hyponatremia   Acute encephalopathy   Weakness generalized   Chronic blood loss anemia      Past Medical History  Diagnosis Date  . VENTRICULAR TACHYCARDIA   . PERIPHERAL VASCULAR DISEASE   . HYPERTENSION     Hypertensive retinopathy-grade II OU  . HYPERCHOLESTEROLEMIA   . CORONARY ARTERY DISEASE     Hx of CABG  . Chronic systolic heart failure     Ischemic cardiomyopathy (01/2014 EF 40-45% with hypokinesis + small area of akinesis; 08/2014, Echo with EF 40-45 percent, PAS 78.  11/2014 ECHO EF 25-30%, inferior akinesis, dilated LV, moderate mitral regurg.  . SEBORRHEIC DERMATITIS   . Chronic renal insufficiency, stage III (moderate)     CrCl 40s-50s  . DIVERTICULOSIS OF COLON   . COPD, severe   . COLONIC POLYPS   . ANXIETY   . Type II diabetes mellitus     +background diabetic retinopathy OU  . Chronic lower back pain   . DJD (degenerative joint disease)     "hands" (05/24/2012)  . Osteoarthritis of finger   . History of atrial flutter   .  Thrombus 12/2013    on ICD lead--started anticoag and his cardioversion for atrial flutter was postponed.  . Colon stricture 2015    with features worrisome for mass, also with recent rising CEA level (possible colon cancer)-GI MD= Dr. Jackelyn Hoehn has been declining colonoscopy since that time.  Dr. Liliane Channel impression on re-eval in 2015 was that the area represented chronic post-diverticulitis changes, not malignancy.  . Macular degeneration, age related, nonexudative     OU  . Chronic blood loss anemia     Transfused 2 U pRBCs April, May, and June (10/26/14) of 2016.  GI w/u has revealed only 2 tiny non-bleeding AVMs in mid small bowel.  Marland Kitchen AICD (automatic cardioverter/defibrillator) present     Past Surgical History  Procedure Laterality Date  . Coronary artery bypass graft  1990's    CABG X4  . Tonsillectomy  ?1942  . Cardiac defibrillator placement  12/2004    single chamber defibrillator- [employer]onic Maxima Dr Caryl Comes   .  Tee without cardioversion N/A 12/22/2013    Procedure: TRANSESOPHAGEAL ECHOCARDIOGRAM (TEE);  Surgeon: Thayer Headings, MD;  Location: [facility];  Service: Cardiovascular;  Laterality: N/A;  . Cardioversion N/A 12/22/2013    Procedure: CARDIOVERSION;  Surgeon: Thayer Headings, MD;  Location: [facility] ENDOSCOPY;  Service: Cardiovascular;  Laterality: N/A;  . Cardiovascular stress test  01/12/13    myocard perf imaging: previous infarct with a moderately reduced EF as was known previously.  No new findings.   . Gastric emptying scan  02/02/14    Normal  . Transthoracic echocardiogram  01/25/14; 04/2014;11/2014    EF 40-45%, diffuse hypokinesis, infero-basilar myocardial akinesis, PA pressure slightly high, valves fine.04/2014-Echo today EF ~40% inferior AK moderate to severe MR. 2016 echo showed EF 25-30%, inferior akinesis, dilated LV, moderate mitral regurg  . Abdominal ultrasound  01/2014    GB sludge, o/w normal  . Ct virtual colonoscopy diagnostic  11/2012; repeat 09/2014     Asymmetric thickening of the rectosigmoid colon worrisome for colon cancer, but further eval by pt's GI MD led to conclusion that this was likely sequela of recurrent diverticulitis.  Pt has refused colonoscopy to evaluate this region.  . Cardiovascular stress test      Lexiscan: There is a medium sized fixed inferior wall defect of moderate severity involving the apical inferior and mid inferior and basal inferoseptal segments, consistent with old inferior MI. No significant reversible ischemia.  EF 34%, inferior wall hypokinesis--intermediate risk scan (ok to do planned procedure)  . Implantable cardioverter defibrillator revision N/A 05/25/2012    Procedure: IMPLANTABLE CARDIOVERTER DEFIBRILLATOR REVISION;  Surgeon: Evans Lance, MD;  Location: [facility] CATH LAB;  Service: Cardiovascular;  Laterality: N/A;  . Right heart catheterization N/A 08/31/2014    Procedure: RIGHT HEART CATH;  Surgeon: Larey Dresser, MD;  Location: [facility] CATH LAB;  Service: Cardiovascular;  Laterality: N/A;  . Esophagogastroduodenoscopy N/A 09/03/2014    Candida esophagitis + retained gastric contents.  No bleeding. Procedure: ESOPHAGOGASTRODUODENOSCOPY (EGD);  Surgeon: Carol Ada, MD;  Location: [facility] ENDOSCOPY;  Service: Endoscopy;  Laterality: N/A;  . Givens capsule study N/A 10/12/2014    Procedure: GIVENS CAPSULE STUDY;  Surgeon: Irene Shipper, MD;  Location: [facility];  Service: Endoscopy;  Laterality: N/A;       History of present illness and  Hospital Course:     Kindly see H&P for history of present illness and admission details, please review complete Labs, Consult reports and Test reports for all details in brief  HPI  from the history and physical done on the day of admission  HPI: [name] is a 79 y.o. male with a history of CAD S/P CABG, Chronic Systolic CHF (EF =28% as of ECHO 08/2013) S/P AICD, Stage III CKD, Chronic Atrial fibrillation ( Not a Coumadin Candidate), Chronic Anemia due to AVMs who  presents to the ED due to increased weakness and falls at home for the past 2 days. He was advised by Home health and Physical Therapy to go to the ED today due to his increased weakness. He denies any chest pain, or SOB and fevers or chills. He had a paracentesis on 01/05/2015 and 2.7 liters was removed. He was evaluated in the ED and found to have hyponatremia at 123, and was referred for admission.   Hospital Course   Hyponatremia - Likely due to volume depletion from dehydration,and  worsening renal failure, patient has chronic hyponatremia, sodium on average 127 to 130, improved with IV fluids  and holding diuresis, sodium is 126 at day of discharge, 122 on admission, anticipate will continue to improve as patient is holding his torsemide on discharge.   Acute encephalopathy - Secondary to hypernatremia, back to baseline  Generalized weakness - Secondary to hyponatremia and dehydration  COPD - No active wheezing, continue with nebs,   Chronic systolic CHF - No evidence of volume overload  - CHF consult greatly appreciated, torsemide will be held on discharge, resume when  weight is more than 137.  Acute on chronic renal failure - Improving with hydration,   Coronary artery disease - Denies any chest pain or shortness of breath, mild elevation of troponin most likely related to his chronic kidney disease at baseline, troponins has been trended and stable.  Chronic A. Fib - On amiodarone - Not anti-coagulation candidate  Diabetes mellitus  - resume home meds.  HTN - monitor  Hypothyroid - on synthroid   Discharge Condition:  stable   Follow UP  Follow-up Information    Follow up with Tammi Sou, MD. Call on 01/17/2015.   Specialty:  Family Medicine   Why:  Posthospitalization follow-up appt at 10:15 a.m.   Contact information:   [address] [place] [address] [phone]       Follow up with Glori Bickers, MD On 01/30/2015.    Specialty:  Cardiology   Why:  at 1:40 Garage Code [id number]    Contact information:   [address] [place] [address] [phone]         Discharge Instructions  and  Discharge Medications    Discharge Instructions    Diet - low sodium heart healthy    Complete by:  As directed      Discharge instructions    Complete by:  As directed   Follow with Primary MD MCGOWEN,PHILIP H, MD in 7 days  - Please check daily weight , is young torsemide 40 mg daily once you are weight is 137 pounds.  Get CBC, CMP, 2 view Chest X ray checked  by Primary MD next visit.    Activity: As tolerated with Full fall precautions use walker/cane & assistance as needed   Disposition Home    Diet: Heart Healthy  , with feeding assistance and aspiration precautions.  For Heart failure patients - Check your Weight same time everyday, if you gain over 2 pounds, or you develop in leg swelling, experience more shortness of breath or chest pain, call your Primary MD immediately. Follow Cardiac Low Salt Diet and 1.5 lit/day fluid restriction.   On your next visit with your primary care physician please Get Medicines reviewed and adjusted.   Please request your Prim.MD to go over all Hospital Tests and Procedure/Radiological results at the follow up, please get all Hospital records sent to your Prim MD by signing hospital release before you go home.   If you experience worsening of your admission symptoms, develop shortness of breath, life threatening emergency, suicidal or homicidal thoughts you must seek medical attention immediately by calling 911 or calling your MD immediately  if symptoms less severe.  You Must read complete instructions/literature along with all the possible adverse reactions/side effects for all the Medicines you take and that have been prescribed to you. Take any new Medicines after you have completely understood and accpet all the possible adverse reactions/side  effects.   Do not drive, operating heavy machinery, perform activities at heights, swimming or participation in water activities or  provide baby sitting services if your were admitted for syncope or siezures until you have seen by Primary MD or a Neurologist and advised to do so again.  Do not drive when taking Pain medications.    Do not take more than prescribed Pain, Sleep and Anxiety Medications  Special Instructions: If you have smoked or chewed Tobacco  in the last 2 yrs please stop smoking, stop any regular Alcohol  and or any Recreational drug use.  Wear Seat belts while driving.   Please note  You were cared for by a hospitalist during your hospital stay. If you have any questions about your discharge medications or the care you received while you were in the hospital after you are discharged, you can call the unit and asked to speak with the hospitalist on call if the hospitalist that took care of you is not available. Once you are discharged, your primary care physician will handle any further medical issues. Please note that NO REFILLS for any discharge medications will be authorized once you are discharged, as it is imperative that you return to your primary care physician (or establish a relationship with a primary care physician if you do not have one) for your aftercare needs so that they can reassess your need for medications and monitor your lab values.     Increase activity slowly    Complete by:  As directed             Medication List    STOP taking these medications        GLUCERNA ADVANCE SHAKE Liqd     senna-docusate 8.6-50 MG per tablet  Commonly known as:  Senokot-S     spironolactone 50 MG tablet  Commonly known as:  ALDACTONE     thiamine 100 MG tablet  Commonly known as:  VITAMIN B-1      TAKE these medications        amiodarone 200 MG tablet  Commonly known as:  PACERONE  Take 0.5 tablets (100 mg total) by mouth daily.      Fluticasone-Salmeterol 500-50 MCG/DOSE Aepb  Commonly known as:  ADVAIR  Inhale 1 puff into the lungs 2 (two) times daily.     folic acid 1 MG tablet  Commonly known as:  FOLVITE  Take 1 tablet (1 mg total) by mouth daily.     guaiFENesin 600 MG 12 hr tablet  Commonly known as:  MUCINEX  Take 1 tablet (600 mg total) by mouth 2 (two) times daily.     Insulin Glargine 100 UNIT/ML Solostar Pen  Commonly known as:  LANTUS SOLOSTAR  10 units SQ qhs     insulin lispro 100 UNIT/ML KiwkPen  Commonly known as:  HUMALOG KWIKPEN  Take 5 units in the morning, 5 units at noon, --if blood sugar over 100 and you eat > 50% of meals     levothyroxine 88 MCG tablet  Commonly known as:  SYNTHROID, LEVOTHROID  Take 1 tablet (88 mcg total) by mouth daily.     magnesium oxide 400 (241.3 MG) MG tablet  Commonly known as:  MAG-OX  Take 1 tablet (400 mg total) by mouth 3 (three) times daily.     pantoprazole 40 MG tablet  Commonly known as:  PROTONIX  Take 1 tablet (40 mg total) by mouth daily.     potassium chloride SA 20 MEQ tablet  Commonly known as:  KLOR-CON M20  Take 1 tablet (20 mEq total) by mouth daily.  pravastatin 80 MG tablet  Commonly known as:  PRAVACHOL  Take 1 tablet (80 mg total) by mouth at bedtime.     tiotropium 18 MCG inhalation capsule  Commonly known as:  SPIRIVA HANDIHALER  PLACE ONE CAPSULE INTO INHALER AND  INHALE  DAILY     torsemide 20 MG tablet  Commonly known as:  DEMADEX  resume torsemide 40 mg oral daily, one year weight is 137 pounds.          Diet and Activity recommendation: See Discharge Instructions above   Consults obtained -  CHF cardiology   Major procedures and Radiology Reports - PLEASE review detailed and final reports for all details, in brief -      Dg Chest 2 View  01/10/2015   CLINICAL DATA:  Weakness, cough, congestion.  Shortness of breath.  EXAM: CHEST  2 VIEW  COMPARISON:  12/02/2014  FINDINGS: There is hyperinflation of  the lungs compatible with COPD. Bilateral calcified pleural plaques again noted, unchanged. No confluent airspace opacities or effusions. No acute bony abnormality. Left AICD rib remains in place, unchanged. Heart is normal size. Prior CABG.  IMPRESSION: Stable bilateral calcified pleural plaques. COPD. No active disease.   Electronically Signed   By: Rolm Baptise M.D.   On: 01/10/2015 13:57   Ct Head Wo Contrast  01/10/2015   CLINICAL DATA:  Recurrent falls.  Generalized weakness.  EXAM: CT HEAD WITHOUT CONTRAST  TECHNIQUE: Contiguous axial images were obtained from the base of the skull through the vertex without intravenous contrast.  COMPARISON:  Head CT scan 11/03/2014 and 10/09/2014  FINDINGS: Mild atrophy is identified. There is no evidence of acute intracranial abnormality including hemorrhage, infarct, mass lesion, mass effect, midline shift or abnormal extra-axial fluid collection. No hydrocephalus or pneumocephalus. The calvarium is intact. Carotid atherosclerosis is noted.  IMPRESSION: No acute abnormality.  Mild atrophy.  Atherosclerosis.   Electronically Signed   By: Inge Rise M.D.   On: 01/10/2015 17:47   US Paracentesis  01/05/2015   INDICATION: CHF, recurrent ascites. Request is made for therapeutic paracentesis.  EXAM: ULTRASOUND-GUIDED THERAPEUTIC PARACENTESIS  COMPARISON:  Prior paracentesis on  11/28/2014  MEDICATIONS: None.  COMPLICATIONS: None immediate  TECHNIQUE: Informed written consent was obtained from the patient after a discussion of the risks, benefits and alternatives to treatment. A timeout was performed prior to the initiation of the procedure.  Initial ultrasound scanning demonstrates a moderate amount of ascites within the right lower abdominal quadrant. The right lower abdomen was prepped and draped in the usual sterile fashion. 1% lidocaine was used for local anesthesia. Under direct ultrasound guidance, a 19 gauge, 10-cm, Yueh catheter was introduced. An ultrasound  image was saved for documentation purposed. The paracentesis was performed. The catheter was removed and a dressing was applied. The patient tolerated the procedure well without immediate post procedural complication.  FINDINGS: A total of approximately 2.7 liters of yellow fluid was removed.  IMPRESSION: Successful ultrasound-guided therapeutic paracentesis yielding 2.7 liters of peritoneal fluid.  Read by: Rowe Obie, PA-C   Electronically Signed   By: Aletta Edouard M.D.   On: 01/05/2015 15:24    Micro Results    Recent Results (from the past 240 hour(s))  MRSA PCR Screening     Status: None   Collection Time: 01/11/15  5:56 AM  Result Value Ref Range Status   MRSA by PCR NEGATIVE NEGATIVE Final    Comment:        The GeneXpert MRSA Assay (  FDA approved for NASAL specimens only), is one component of a comprehensive MRSA colonization surveillance program. It is not intended to diagnose MRSA infection nor to guide or monitor treatment for MRSA infections.        Today   Subjective:   [name] today has no headache,no chest abdominal pain,no new weakness tingling or numbness, feels much better  today.   Objective:   Blood pressure 126/50, pulse 62, temperature 97.3 F (36.3 C), temperature source Oral, resp. rate 18, height 5\' 7"  (1.702 m), weight 60.555 kg (133 lb 8 oz), SpO2 97 %.   Intake/Output Summary (Last 24 hours) at 01/12/15 1615 Last data filed at 01/12/15 1301  Gross per 24 hour  Intake   2925 ml  Output    650 ml  Net   2275 ml    Exam Awake Alert, Oriented x 3, No new F.N deficits, Normal affect .AT,PERRAL Supple Neck,No JVD, No cervical lymphadenopathy appriciated.  Symmetrical Chest wall movement, Good air movement bilaterally, CTAB No Gallops,Rubs or new Murmurs, No Parasternal Heave +ve B.Sounds, Abd Soft, Non tender, No organomegaly appriciated, No rebound -guarding or rigidity. No Cyanosis, Clubbing or edema, No new Rash or  bruise  Data Review   CBC w Diff: Lab Results  Component Value Date   WBC 7.4 01/11/2015   HGB 9.5* 01/11/2015   HCT 28.2* 01/11/2015   PLT 123* 01/11/2015   LYMPHOPCT 15.5 12/07/2014   MONOPCT 12.3* 12/07/2014   EOSPCT 0.2 12/07/2014   BASOPCT 0.1 12/07/2014    CMP: Lab Results  Component Value Date   NA 126* 01/12/2015   K 4.5 01/12/2015   CL 95* 01/12/2015   CO2 24 01/12/2015   BUN 21* 01/12/2015   CREATININE 2.04* 01/12/2015   PROT 6.6 01/10/2015   ALBUMIN 2.6* 01/10/2015   BILITOT 1.7* 01/10/2015   ALKPHOS 125 01/10/2015   AST 96* 01/10/2015   ALT 34 01/10/2015  .   Total Time in preparing paper work, data evaluation and todays exam - 35 minutes  Tion Tse M.D on 01/12/2015 at [facility]   Office  [phone]

## 2015-01-12 NOTE — Progress Notes (Signed)
[facility]  Patient Status: Active (receiving services up to time of hospitalization)  [facility] is providing the following services: RN and PT  If patient discharges after hours, please call [phone].   [name] 01/12/2015, 11:22 AM

## 2015-01-12 NOTE — Consult Note (Signed)
Advanced Heart Failure Team Consult Note  Referring Physician: Dr Marvel Plan Primary Physician: Primary HF Cardiologist: Dr Marquette Saa  Reason for Consultation: Heart Failure   HPI:   [name] is a 79 yo male with a history of HTN, CAD s/p CABG, ICM s/p Medtronic ICD, chronic systolic heart failure, DM2, Vtach, atrial flutter, PVD and GI mass. ECHO (12/22/13) EF 20-25%, Echo EF 40% (12/15) Mod-severe MR. In past he has seen Dr Earlean Shawl for possible colon mass/thickening and felt to be result of previous diverticulitis.   In 01/2014 with volume overload and low output. Required short term milrinone.  Was admitted 2x in 4/16. In early April had a/c CHF with cardiorenal syndrome. Treated with milrinone and improved. Also had GI bleed at that time and EGD with candidal esophagitis. Also two colonic AVMs. Eliquis eventually stopped due to recurrent bleeding and falls. In late April admitted with PNA. Treated with abx. D/C weight was 147.   He was admitted in 7/16 with orthostatic dizziness and poor po intake. Hydralazine and Imdur were stopped with improvement in symptoms. Echo in 7/16 showed EF 25-30% with moderately dilated LV and moderate MR.   He has been followed closely in the HF clinic and was last seen 12/27/2014. He was volume overloaded and torsemide was increased. He was not on bb or hydralazine due to ortho stasis. Weight at that time was 138 pounds.   Presented to [facility] weakness and dyspnea.  Had presyncope and hit his head. Frequent falls at home. CT of head negative. CXR with no active disease. Sodium was 122 so he was admitted for further evaluation. Received NS. Diuretics have been placed on home. Weight up 4 pound since admit.   Overall feeling much better. Denies SOB. Wants to go home.    Creatinine 2.04  Na 122> 126    Review of Systems: [y] = yes, [ ]  = no   General: Weight gain [ ] ; Weight loss [ Y]; Anorexia [ ] ; Fatigue [Y ]; Fever [ ] ; Chills [ ] ; Weakness [Y ]   Cardiac: Chest pain/pressure [ ] ; Resting SOB [ ] ; Exertional SOB [Y ]; Orthopnea [ ] ; Pedal Edema [ Y]; Palpitations [ ] ; Syncope [ ] ; Presyncope [Y ]; Paroxysmal nocturnal dyspnea[ ]   Pulmonary: Cough [ ] ; Wheezing[ ] ; Hemoptysis[ ] ; Sputum [ ] ; Snoring [ ]   GI: Vomiting[ ] ; Dysphagia[ ] ; Melena[ ] ; Hematochezia [ ] ; Heartburn[ ] ; Abdominal pain [ ] ; Constipation [ ] ; Diarrhea [ ] ; BRBPR [ ]   GU: Hematuria[ ] ; Dysuria [ ] ; Nocturia[ ]   Vascular: Pain in legs with walking [ ] ; Pain in feet with lying flat [ ] ; Non-healing sores [ ] ; Stroke [ ] ; TIA [ ] ; Slurred speech [ ] ;  Neuro: Headaches[ ] ; Vertigo[ ] ; Seizures[ ] ; Paresthesias[ ] ;Blurred vision [ ] ; Diplopia [ ] ; Vision changes [ ]   Ortho/Skin: Arthritis [ ] ; Joint pain [ ] ; Muscle pain [ ] ; Joint swelling [ ] ; Back Pain [ ] ; Rash [ ]   Psych: Depression[ ] ; Anxiety[ ]   Heme: Bleeding problems [ ] ; Clotting disorders [ ] ; Anemia [ ]   Endocrine: Diabetes [Y ]; Thyroid dysfunction[ ]   Home Medications Prior to Admission medications   Medication Sig Start Date End Date Taking? Authorizing Provider  amiodarone (PACERONE) 200 MG tablet Take 0.5 tablets (100 mg total) by mouth daily. 12/26/14  Yes Larey Dresser, MD  Fluticasone-Salmeterol (ADVAIR) 500-50 MCG/DOSE AEPB Inhale 1 puff into the lungs 2 (two) times daily. 04/05/14  Yes Noralee Space,  MD  folic acid (FOLVITE) 1 MG tablet Take 1 tablet (1 mg total) by mouth daily. 11/22/14  Yes Tammi Sou, MD  guaiFENesin (MUCINEX) 600 MG 12 hr tablet Take 1 tablet (600 mg total) by mouth 2 (two) times daily. 11/17/14  Yes Irene Pap, NP  Insulin Glargine (LANTUS SOLOSTAR) 100 UNIT/ML Solostar Pen 10 units SQ qhs Patient taking differently: Inject 10 Units into the skin at bedtime.  02/10/14  Yes Tammi Sou, MD  insulin lispro (HUMALOG KWIKPEN) 100 UNIT/ML KiwkPen Take 5 units in the morning, 5 units at noon, --if blood sugar over 100 and you eat > 50% of meals Patient taking differently:  Inject 7-9 Units into the skin 3 (three) times daily. Take 7 units in the morning, 8 units at noon, and 9 units at dinner 10/18/14  Yes Ivan Anchors Love, PA-C  levothyroxine (SYNTHROID, LEVOTHROID) 88 MCG tablet Take 1 tablet (88 mcg total) by mouth daily. 10/25/14  Yes Tammi Sou, MD  magnesium oxide (MAG-OX) 400 (241.3 MG) MG tablet Take 1 tablet (400 mg total) by mouth 3 (three) times daily. 01/09/15  Yes Tammi Sou, MD  pantoprazole (PROTONIX) 40 MG tablet Take 1 tablet (40 mg total) by mouth daily. 10/18/14  Yes Ivan Anchors Love, PA-C  potassium chloride SA (KLOR-CON M20) 20 MEQ tablet Take 1 tablet (20 mEq total) by mouth 2 (two) times daily. 10/18/14  Yes Ivan Anchors Love, PA-C  pravastatin (PRAVACHOL) 80 MG tablet Take 1 tablet (80 mg total) by mouth at bedtime. 01/02/15  Yes Jolaine Artist, MD  tiotropium (SPIRIVA HANDIHALER) 18 MCG inhalation capsule PLACE ONE CAPSULE INTO INHALER AND  INHALE  DAILY 12/25/14  Yes Noralee Space, MD  torsemide (DEMADEX) 20 MG tablet Take 40mg  am and 20mg  pm every other day ALTERNATING with 20 mg Twice daily every other day 12/26/14  Yes Larey Dresser, MD  Nutritional Supplements (GLUCERNA ADVANCE SHAKE) LIQD Take 1 Bottle by mouth 3 (three) times daily. 12/11/14   Larey Dresser, MD  senna-docusate (SENOKOT-S) 8.6-50 MG per tablet Take 1 tablet by mouth at bedtime as needed for mild constipation. 10/18/14   Bary Leriche, PA-C  spironolactone (ALDACTONE) 50 MG tablet Take 1 tablet (50 mg total) by mouth daily. 01/03/15   Tammi Sou, MD  thiamine (VITAMIN B-1) 100 MG tablet Take 1 tablet (100 mg total) by mouth daily. 11/29/14   Larey Dresser, MD    Past Medical History: Past Medical History  Diagnosis Date  . VENTRICULAR TACHYCARDIA   . PERIPHERAL VASCULAR DISEASE   . HYPERTENSION     Hypertensive retinopathy-grade II OU  . HYPERCHOLESTEROLEMIA   . CORONARY ARTERY DISEASE     Hx of CABG  . Chronic systolic heart failure     Ischemic cardiomyopathy  (01/2014 EF 40-45% with hypokinesis + small area of akinesis; 08/2014, Echo with EF 40-45 percent, PAS 78.  11/2014 ECHO EF 25-30%, inferior akinesis, dilated LV, moderate mitral regurg.  . SEBORRHEIC DERMATITIS   . Chronic renal insufficiency, stage III (moderate)     CrCl 40s-50s  . DIVERTICULOSIS OF COLON   . COPD, severe   . COLONIC POLYPS   . ANXIETY   . Type II diabetes mellitus     +background diabetic retinopathy OU  . Chronic lower back pain   . DJD (degenerative joint disease)     "hands" (05/24/2012)  . Osteoarthritis of finger   . History of atrial flutter   .  Thrombus 12/2013    on ICD lead--started anticoag and his cardioversion for atrial flutter was postponed.  . Colon stricture 2015    with features worrisome for mass, also with recent rising CEA level (possible colon cancer)-GI MD= Dr. Jackelyn Hoehn has been declining colonoscopy since that time.  Dr. Liliane Channel impression on re-eval in 2015 was that the area represented chronic post-diverticulitis changes, not malignancy.  . Macular degeneration, age related, nonexudative     OU  . Chronic blood loss anemia     Transfused 2 U pRBCs April, May, and June (10/26/14) of 2016.  GI w/u has revealed only 2 tiny non-bleeding AVMs in mid small bowel.  Marland Kitchen AICD (automatic cardioverter/defibrillator) present     Past Surgical History: Past Surgical History  Procedure Laterality Date  . Coronary artery bypass graft  1990's    CABG X4  . Tonsillectomy  ?1942  . Cardiac defibrillator placement  12/2004    single chamber defibrillator- Medtronic Maxima Dr Caryl Comes   . Tee without cardioversion N/A 12/22/2013    Procedure: TRANSESOPHAGEAL ECHOCARDIOGRAM (TEE);  Surgeon: Thayer Headings, MD;  Location: [facility];  Service: Cardiovascular;  Laterality: N/A;  . Cardioversion N/A 12/22/2013    Procedure: CARDIOVERSION;  Surgeon: Thayer Headings, MD;  Location: [facility] ENDOSCOPY;  Service: Cardiovascular;  Laterality: N/A;  . Cardiovascular stress  test  01/12/13    myocard perf imaging: previous infarct with a moderately reduced EF as was known previously.  No new findings.   . Gastric emptying scan  02/02/14    Normal  . Transthoracic echocardiogram  01/25/14; 04/2014;11/2014    EF 40-45%, diffuse hypokinesis, infero-basilar myocardial akinesis, PA pressure slightly high, valves fine.04/2014-Echo today EF ~40% inferior AK moderate to severe MR. 2016 echo showed EF 25-30%, inferior akinesis, dilated LV, moderate mitral regurg  . Abdominal ultrasound  01/2014    GB sludge, o/w normal  . Ct virtual colonoscopy diagnostic  11/2012; repeat 09/2014    Asymmetric thickening of the rectosigmoid colon worrisome for colon cancer, but further eval by pt's GI MD led to conclusion that this was likely sequela of recurrent diverticulitis.  Pt has refused colonoscopy to evaluate this region.  . Cardiovascular stress test      Lexiscan: There is a medium sized fixed inferior wall defect of moderate severity involving the apical inferior and mid inferior and basal inferoseptal segments, consistent with old inferior MI. No significant reversible ischemia.  EF 34%, inferior wall hypokinesis--intermediate risk scan (ok to do planned procedure)  . Implantable cardioverter defibrillator revision N/A 05/25/2012    Procedure: IMPLANTABLE CARDIOVERTER DEFIBRILLATOR REVISION;  Surgeon: Evans Lance, MD;  Location[facility] CATH LAB;  Service: Cardiovascular;  Laterality: N/A;  . Right heart catheterization N/A 08/31/2014    Procedure: RIGHT HEART CATH;  Surgeon: Larey Dresser, MD;  Location: [facility] CATH LAB;  Service: Cardiovascular;  Laterality: N/A;  . Esophagogastroduodenoscopy N/A 09/03/2014    Candida esophagitis + retained gastric contents.  No bleeding. Procedure: ESOPHAGOGASTRODUODENOSCOPY (EGD);  Surgeon: Carol Ada, MD;  Location: [facility] ENDOSCOPY;  Service: Endoscopy;  Laterality: N/A;  . Givens capsule study N/A 10/12/2014    Procedure: GIVENS CAPSULE STUDY;  Surgeon: Irene Shipper, MD;  Location: [facility];  Service: Endoscopy;  Laterality: N/A;    Family History: Family History  Problem Relation Age of Onset  . Heart attack Father 80  . Heart attack Brother     Vague history    Social History: Social History   Social  History  . Marital Status: Married    Spouse Name: N/A  . Number of Children: N/A  . Years of Education: N/A   Occupational History  . Retired    Social History Main Topics  . Smoking status: Former Smoker -- 2.00 packs/day for 50 years    Types: Cigarettes    Quit date: 06/24/2000  . Smokeless tobacco: Never Used  . Alcohol Use: 0.0 oz/week    0 Standard drinks or equivalent per week     Comment: 05/24/2012 "used to drink alot; quit > 10 yr ago"  . Drug Use: No  . Sexual Activity: No   Other Topics Concern  . None   Social History Narrative   Lives in [place] with his wife.    Allergies:  Allergies  Allergen Reactions  . Niacin Itching    Niaspan     Objective:    Vital Signs:   Temp:  [97.8 F (36.6 C)-98.4 F (36.9 C)] 97.8 F (36.6 C) (08/19 0258) Pulse Rate:  [63-68] 63 (08/19 1153) Resp:  [17-18] 17 (08/19 0638) BP: (108-131)/(47-64) 108/53 mmHg (08/19 0944) SpO2:  [95 %-100 %] 95 % (08/19 1153) Weight:  [133 lb 8 oz (60.555 kg)] 133 lb 8 oz (60.555 kg) (08/19 0638) Last BM Date: 01/08/15  Weight change: Filed Weights   01/10/15 2231 01/11/15 0814 01/12/15 5277  Weight: 129 lb 1.6 oz (58.559 kg) 128 lb 4.8 oz (58.196 kg) 133 lb 8 oz (60.555 kg)    Intake/Output:   Intake/Output Summary (Last 24 hours) at 01/12/15 1233 Last data filed at 01/12/15 8242  Gross per 24 hour  Intake   2925 ml  Output    825 ml  Net   2100 ml     Physical Exam: Sitting in chair General:  Well appearing. No resp difficulty HEENT: normal Neck: supple. JVP 5-6 . Carotids 2+ bilat; no bruits. No lymphadenopathy or thryomegaly appreciated. Cor: PMI nondisplaced. Regular rate & rhythm. No rubs, gallops  or murmurs. Lungs: clear Abdomen: soft, nontender, nondistended. No hepatosplenomegaly. No bruits or masses. Good bowel sounds. Extremities: no cyanosis, clubbing, rash, LLE 1+ edema.  Neuro: alert & orientedx3, cranial nerves grossly intact. moves all 4 extremities w/o difficulty. Affect pleasant Telemetry: SR PACS 60s   Labs: Basic Metabolic Panel:  Recent Labs Lab 01/10/15 1323 01/10/15 1730 01/11/15 0820 01/12/15 0359  NA 122*  --  128* 126*  K 4.1  --  4.3 4.5  CL 84*  --  93* 95*  CO2 27  --  24 24  GLUCOSE 147*  --  140* 128*  BUN 31*  --  30* 21*  CREATININE 2.32*  --  2.17* 2.04*  CALCIUM 9.0  --  8.6* 8.4*  MG  --  1.9  --   --   PHOS  --  3.5  --   --     Liver Function Tests:  Recent Labs Lab 01/10/15 1730  AST 96*  ALT 34  ALKPHOS 125  BILITOT 1.7*  PROT 6.6  ALBUMIN 2.6*   No results for input(s): LIPASE, AMYLASE in the last 168 hours. No results for input(s): AMMONIA in the last 168 hours.  CBC:  Recent Labs Lab 01/10/15 1323 01/11/15 0820  WBC 8.5 7.4  HGB 10.3* 9.5*  HCT 30.1* 28.2*  MCV 83.1 83.2  PLT 130* 123*    Cardiac Enzymes:  Recent Labs Lab 01/10/15 1838 01/10/15 2058 01/11/15 0231 01/11/15 0820  TROPONINI 0.05* 0.06* 0.06*  0.05*    BNP: BNP (last 3 results)  Recent Labs  10/09/14 2237 12/02/14 1800 01/10/15 1730  BNP 715.8* 262.1* 304.5*    ProBNP (last 3 results)  Recent Labs  01/24/14 1909 02/09/14 0945  PROBNP 6721.0* 2555.0*     CBG:  Recent Labs Lab 01/11/15 1121 01/11/15 1651 01/11/15 2123 01/12/15 0621 01/12/15 1200  GLUCAP 142* 153* 148* 128* 136*    Coagulation Studies: No results for input(s): LABPROT, INR in the last 72 hours.  Other results: EKG: SR PACs 66 bpm   Imaging: Dg Chest 2 View  01/10/2015   CLINICAL DATA:  Weakness, cough, congestion.  Shortness of breath.  EXAM: CHEST  2 VIEW  COMPARISON:  12/02/2014  FINDINGS: There is hyperinflation of the lungs compatible  with COPD. Bilateral calcified pleural plaques again noted, unchanged. No confluent airspace opacities or effusions. No acute bony abnormality. Left AICD rib remains in place, unchanged. Heart is normal size. Prior CABG.  IMPRESSION: Stable bilateral calcified pleural plaques. COPD. No active disease.   Electronically Signed   By: Rolm Baptise M.D.   On: 01/10/2015 13:57   Ct Head Wo Contrast  01/10/2015   CLINICAL DATA:  Recurrent falls.  Generalized weakness.  EXAM: CT HEAD WITHOUT CONTRAST  TECHNIQUE: Contiguous axial images were obtained from the base of the skull through the vertex without intravenous contrast.  COMPARISON:  Head CT scan 11/03/2014 and 10/09/2014  FINDINGS: Mild atrophy is identified. There is no evidence of acute intracranial abnormality including hemorrhage, infarct, mass lesion, mass effect, midline shift or abnormal extra-axial fluid collection. No hydrocephalus or pneumocephalus. The calvarium is intact. Carotid atherosclerosis is noted.  IMPRESSION: No acute abnormality.  Mild atrophy.  Atherosclerosis.   Electronically Signed   By: Inge Rise M.D.   On: 01/10/2015 17:47      Medications:     Current Medications: . amiodarone  100 mg Oral Daily  . folic acid  1 mg Oral Daily  . insulin aspart  0-5 Units Subcutaneous QHS  . insulin aspart  0-9 Units Subcutaneous TID WC  . insulin glargine  10 Units Subcutaneous QHS  . ipratropium-albuterol  3 mL Nebulization Q6H  . levothyroxine  88 mcg Oral QAC breakfast  . pantoprazole  40 mg Oral Daily  . potassium chloride SA  20 mEq Oral BID  . sodium chloride  3 mL Intravenous Q12H     Infusions:      Assessment:  1. Hyponatremia 2. Falls 3. Chronic Systolic HF ECHO 06/2631 35-45% 4. Afib/A flut 5. Mitral regurgitation - moderate to severe due to restriction of posterior MV leaflet 6. CKD 7. DM  8. CAD S/P CABG. Continue pravastatin. Not on ASA with apixaban use.  9. COPD 10. H/O VT  Plan/Discussion:     Mr Garrel Ridgel is well know to the HF team and has been followed closely. We asked to consult by Dr Marvel Plan for HF. Admitted with hyponatremia and falls.   From HF perspective he is stable. Diuretics have been held and his weight is well below his baseline. Improved with NS. Hold diuretics and restart torsemide 40 daily only if weight 137 pounds.  Allow weight to drift up. No bb or hydralazine due to ortho stasis he has had in the past.   In NSR. Rate controlled. Continue amiodarone 100 mg daily. No anticoagulants with falls.   Agree with HH for PT RN.   Will need HF follow up.    Length of Stay: 2  CLEGG,AMY  NP-C   01/12/2015, 12:33 PM  Advanced Heart Failure Team Pager [phone] (M-F; 7a - 4p)  Please contact [facility] for night-coverage after hours (4p -7a ) and weekends on [email or web address]  Patient seen and examined with Darrick Grinder, NP. We discussed all aspects of the encounter. I agree with the assessment and plan as stated above.   Stable from a HF perspective though I remain concerned about his progressive functional decline. [place] for d/c today. Would hold torsemide unless weight 137 or greater and then give 40 mg daily. May need more frequent paracentesis to help manage RHF and cirrhosis. We will see him back in HF Clinic.   Bensimhon, Daniel,MD 2:17 PM

## 2015-01-12 NOTE — Discharge Instructions (Signed)
Follow with Primary MD MCGOWEN,PHILIP H, MD in 7 days  - Please check daily weight , is young torsemide 40 mg daily once you are weight is 137 pounds.  Get CBC, CMP, 2 view Chest X ray checked  by Primary MD next visit.    Activity: As tolerated with Full fall precautions use walker/cane & assistance as needed   Disposition Home    Diet: Heart Healthy  , with feeding assistance and aspiration precautions.  For Heart failure patients - Check your Weight same time everyday, if you gain over 2 pounds, or you develop in leg swelling, experience more shortness of breath or chest pain, call your Primary MD immediately. Follow Cardiac Low Salt Diet and 1.5 lit/day fluid restriction.   On your next visit with your primary care physician please Get Medicines reviewed and adjusted.   Please request your Prim.MD to go over all Hospital Tests and Procedure/Radiological results at the follow up, please get all Hospital records sent to your Prim MD by signing hospital release before you go home.   If you experience worsening of your admission symptoms, develop shortness of breath, life threatening emergency, suicidal or homicidal thoughts you must seek medical attention immediately by calling 911 or calling your MD immediately  if symptoms less severe.  You Must read complete instructions/literature along with all the possible adverse reactions/side effects for all the Medicines you take and that have been prescribed to you. Take any new Medicines after you have completely understood and accpet all the possible adverse reactions/side effects.   Do not drive, operating heavy machinery, perform activities at heights, swimming or participation in water activities or provide baby sitting services if your were admitted for syncope or siezures until you have seen by Primary MD or a Neurologist and advised to do so again.  Do not drive when taking Pain medications.    Do not take more than prescribed  Pain, Sleep and Anxiety Medications  Special Instructions: If you have smoked or chewed Tobacco  in the last 2 yrs please stop smoking, stop any regular Alcohol  and or any Recreational drug use.  Wear Seat belts while driving.   Please note  You were cared for by a hospitalist during your hospital stay. If you have any questions about your discharge medications or the care you received while you were in the hospital after you are discharged, you can call the unit and asked to speak with the hospitalist on call if the hospitalist that took care of you is not available. Once you are discharged, your primary care physician will handle any further medical issues. Please note that NO REFILLS for any discharge medications will be authorized once you are discharged, as it is imperative that you return to your primary care physician (or establish a relationship with a primary care physician if you do not have one) for your aftercare needs so that they can reassess your need for medications and monitor your lab values.

## 2015-01-15 ENCOUNTER — Other Ambulatory Visit (HOSPITAL_COMMUNITY): Payer: Self-pay | Admitting: Internal Medicine

## 2015-01-17 ENCOUNTER — Telehealth (HOSPITAL_COMMUNITY): Payer: Self-pay | Admitting: *Deleted

## 2015-01-17 ENCOUNTER — Ambulatory Visit: Payer: Medicare Other | Admitting: Family Medicine

## 2015-01-17 NOTE — Telephone Encounter (Signed)
She states he was reviewing pt's D/C summary and it states: torsemide 20 MG tablet  Commonly known as: DEMADEX  resume torsemide 40 mg oral daily, one year weight is 137 pounds.         She needs clarification as this does not make since.  Advised hospitalist d/c'd pt and made a mistake, per Dr Bensimhon's note on day of d/c pt should take: restart torsemide 40 daily only if weight 137 pounds  She is aware of correct dose and will notify pt and wife, she states he has been taking 40 mg daily and wt is down to 129 lb today, advised for pt to stop Torsemide and only take if wt gets back up to 137 lb, she is agreeable and will instruct pt and wife to do so, Dr Haroldine Laws is aware and agreeable

## 2015-01-18 ENCOUNTER — Encounter: Payer: Medicare Other | Admitting: Family Medicine

## 2015-01-18 ENCOUNTER — Telehealth: Payer: Self-pay | Admitting: Family Medicine

## 2015-01-18 ENCOUNTER — Encounter: Payer: Self-pay | Admitting: Family Medicine

## 2015-01-18 NOTE — Telephone Encounter (Signed)
Pts. Wife called to cancel appt. She also wanted Dr. Anitra Lauth to know that on 8/22 [name] weighed 134.3lbs, as of this am he weighs 123lbs. He fell last night and EMS needed to be called. They refused transportation to ER. Mrs Treml would like Dr. Anitra Lauth to give her direction on what she needs direction on what to do for [name]. Please call and advise/djh

## 2015-01-18 NOTE — Telephone Encounter (Signed)
Talked to pt's wife. He has still been taking his torsemide 20mg  qd (40mg  today) but hospital d/c orders state that he needs to hold this med until his wt gets up to 137 lbs.  They apparently misunderstood the d/c instructions.  He is urinating "like a racehorse". I recommended he stop torsemide until wt comes up to 137 lbs. Also, I recommended he drink some electrolyte-rich fluid like gatorade or poweraid mixed 50/50 with water. Wife will call with pt's wt's over next couple days.

## 2015-01-18 NOTE — Progress Notes (Signed)
OFFICE VISIT  01/18/2015   CC: No chief complaint on file.    HPI:    Patient is a 79 y.o. Caucasian male who presents for hospital f/u for hyponatremia and altered mental status/generalized weakness secondary to his hyponatremia.   Admitted 8/17-8/19, 2016.   Reviewed d/c summary, imaging reports, labs.   His hyponatremia was due to volume depletion, and with IVF and stopping diuretic in hosp his Na came up and mental status returned to baseline. [name] torsemide is being held until he reaches 137 lbs.  His aldactone was discontinued altogether. Pt chronically hyponatremic, with avg Na 127-130.  Chronic blood loss anemia: Hb range 9.4-10.6 over the last 6 wks--most recent Hb 9.5 the day prior to admission to hosp recently.  CRI, stage III.  Mild ARF in hosp improved with IVF.  Baseline Cr 2-2.5.    DM 2, on basal and prandial insulins: A1c in hosp was 6.0%.  Malnutrition: Albumin avg 2.5, most recent was 2.6 the day of admission to hospital.  Hepatic congestion/ascites secondary to CHF; has had mild elevation of bili and AST, which we'll repeat today. He had paracentesis 01/05/2015 and 2.7 L of fluid was removed at that time.  Past Medical History  Diagnosis Date   VENTRICULAR TACHYCARDIA    PERIPHERAL VASCULAR DISEASE    HYPERTENSION     Hypertensive retinopathy-grade II OU   HYPERCHOLESTEROLEMIA    CORONARY ARTERY DISEASE     Hx of CABG   Chronic systolic heart failure     NYHA class IV: Ischemic cardiomyopathy (01/2014 EF 40-45% with hypokinesis + small area of akinesis; 08/2014, Echo with EF 40-45 percent, PAS 78.  11/2014 ECHO EF 25-30%, inferior akinesis, dilated LV, moderate mitral regurg.   SEBORRHEIC DERMATITIS    Chronic renal insufficiency, stage III (moderate)     CrCl 40s-50s   DIVERTICULOSIS OF COLON    COPD, severe    COLONIC POLYPS    ANXIETY    Type II diabetes mellitus     +background diabetic retinopathy OU   Chronic lower back pain    DJD (degenerative  joint disease)     "hands" (05/24/2012)   Osteoarthritis of finger    History of atrial flutter    Thrombus 12/2013    on ICD lead--started anticoag and his cardioversion for atrial flutter was postponed.   Colon stricture 2015    with features worrisome for mass, also with recent rising CEA level (possible colon cancer)-GI MD= Dr. Jackelyn Hoehn has been declining colonoscopy since that time.  Dr. Liliane Channel impression on re-eval in 2015 was that the area represented chronic post-diverticulitis changes, not malignancy.   Macular degeneration, age related, nonexudative     OU   Chronic blood loss anemia     Transfused 2 U pRBCs April, May, and June (10/26/14) of 2016.  GI w/u has revealed only 2 tiny non-bleeding AVMs in mid small bowel.   AICD (automatic cardioverter/defibrillator) present     Past Surgical History  Procedure Laterality Date   Coronary artery bypass graft  1990's    CABG X4   Tonsillectomy  ?1942   Cardiac defibrillator placement  12/2004    single chamber defibrillator- [employer]onic Maxima Dr Candelaria Celeste without cardioversion N/A 12/22/2013    Procedure: TRANSESOPHAGEAL ECHOCARDIOGRAM (TEE);  Surgeon: Thayer Headings, MD;  Location: [facility];  Service: Cardiovascular;  Laterality: N/A;   Cardioversion N/A 12/22/2013    Procedure: CARDIOVERSION;  Surgeon: Thayer Headings, MD;  Location:  [facility];  Service: Cardiovascular;  Laterality: N/A;   Cardiovascular stress test  01/12/13    myocard perf imaging: previous infarct with a moderately reduced EF as was known previously.  No new findings.    Gastric emptying scan  02/02/14    Normal   Transthoracic echocardiogram  01/25/14; 04/2014;11/2014    EF 40-45%, diffuse hypokinesis, infero-basilar myocardial akinesis, PA pressure slightly high, valves fine.04/2014-Echo today EF ~40% inferior AK moderate to severe MR. 2016 echo showed EF 25-30%, inferior akinesis, dilated LV, moderate mitral regurg   Abdominal ultrasound  01/2014    GB  sludge, o/w normal   Ct virtual colonoscopy diagnostic  11/2012; repeat 09/2014    Asymmetric thickening of the rectosigmoid colon worrisome for colon cancer, but further eval by pt's GI MD led to conclusion that this was likely sequela of recurrent diverticulitis.  Pt has refused colonoscopy to evaluate this region.   Cardiovascular stress test      Lexiscan: There is a medium sized fixed inferior wall defect of moderate severity involving the apical inferior and mid inferior and basal inferoseptal segments, consistent with old inferior MI. No significant reversible ischemia.  EF 34%, inferior wall hypokinesis--intermediate risk scan (ok to do planned procedure)   Implantable cardioverter defibrillator revision N/A 05/25/2012    Procedure: IMPLANTABLE CARDIOVERTER DEFIBRILLATOR REVISION;  Surgeon: Evans Lance, MD;  Location: [facility] CATH LAB;  Service: Cardiovascular;  Laterality: N/A;   Right heart catheterization N/A 08/31/2014    Procedure: RIGHT HEART CATH;  Surgeon: Larey Dresser, MD;  Location: [facility] CATH LAB;  Service: Cardiovascular;  Laterality: N/A;   Esophagogastroduodenoscopy N/A 09/03/2014    Candida esophagitis + retained gastric contents.  No bleeding. Procedure: ESOPHAGOGASTRODUODENOSCOPY (EGD);  Surgeon: Carol Ada, MD;  Location: [facility] ENDOSCOPY;  Service: Endoscopy;  Laterality: N/A;   Givens capsule study N/A 10/12/2014    Procedure: GIVENS CAPSULE STUDY;  Surgeon: Irene Shipper, MD;  Location: [place];  Service: Endoscopy;  Laterality: N/A;    Outpatient Prescriptions Prior to Visit  Medication Sig Dispense Refill   amiodarone (PACERONE) 200 MG tablet Take 0.5 tablets (100 mg total) by mouth daily. 30 tablet 3   Fluticasone-Salmeterol (ADVAIR) 500-50 MCG/DOSE AEPB Inhale 1 puff into the lungs 2 (two) times daily. 60 each 11   folic acid (FOLVITE) 1 MG tablet Take 1 tablet (1 mg total) by mouth daily. 30 tablet 3   guaiFENesin (MUCINEX) 600 MG 12 hr tablet Take 1 tablet (600 mg  total) by mouth 2 (two) times daily. 30 tablet 0   Insulin Glargine (LANTUS SOLOSTAR) 100 UNIT/ML Solostar Pen 10 units SQ qhs (Patient taking differently: Inject 10 Units into the skin at bedtime. ) 15 mL 11   insulin lispro (HUMALOG KWIKPEN) 100 UNIT/ML KiwkPen Take 5 units in the morning, 5 units at noon, --if blood sugar over 100 and you eat > 50% of meals (Patient taking differently: Inject 7-9 Units into the skin 3 (three) times daily. Take 7 units in the morning, 8 units at noon, and 9 units at dinner) 5 pen 3   levothyroxine (SYNTHROID, LEVOTHROID) 88 MCG tablet Take 1 tablet (88 mcg total) by mouth daily. 30 tablet 2   magnesium oxide (MAG-OX) 400 (241.3 MG) MG tablet Take 1 tablet (400 mg total) by mouth 3 (three) times daily. 90 tablet 3   pantoprazole (PROTONIX) 40 MG tablet Take 1 tablet (40 mg total) by mouth daily. 30 tablet 1   potassium chloride SA (KLOR-CON M20)  20 MEQ tablet Take 1 tablet (20 mEq total) by mouth daily. 60 tablet 1   pravastatin (PRAVACHOL) 80 MG tablet Take 1 tablet (80 mg total) by mouth at bedtime. 30 tablet 3   tiotropium (SPIRIVA HANDIHALER) 18 MCG inhalation capsule PLACE ONE CAPSULE INTO INHALER AND  INHALE  DAILY 30 capsule 6   torsemide (DEMADEX) 20 MG tablet resume torsemide 40 mg oral daily, one year weight is 137 pounds. 90 tablet 1   torsemide (DEMADEX) 20 MG tablet Take 2 tablets (40 mg total) by mouth 2 (two) times daily. 120 tablet 0   No facility-administered medications prior to visit.    Allergies  Allergen Reactions   Niacin Itching    Niaspan     ROS As per HPI  PE: There were no vitals taken for this visit.   LABS:  Lab Results  Component Value Date   WBC 7.4 01/11/2015   HGB 9.5* 01/11/2015   HCT 28.2* 01/11/2015   MCV 83.2 01/11/2015   PLT 123* 01/11/2015     Chemistry      Component Value Date/Time   NA 126* 01/12/2015 0359   K 4.5 01/12/2015 0359   CL 95* 01/12/2015 0359   CO2 24 01/12/2015 0359   BUN 21*  01/12/2015 0359   CREATININE 2.04* 01/12/2015 0359      Component Value Date/Time   CALCIUM 8.4* 01/12/2015 0359   ALKPHOS 125 01/10/2015 1730   AST 96* 01/10/2015 1730   ALT 34 01/10/2015 1730   BILITOT 1.7* 01/10/2015 1730     Lab Results  Component Value Date   TSH 3.868 12/04/2014   Lab Results  Component Value Date   HGBA1C 6.0* 01/11/2015     IMPRESSION AND PLAN:  No problem-specific assessment & plan notes found for this encounter.  Needs CBC and CMET. Needs prevnar 13. CXR?  FOLLOW UP: No Follow-up on file.

## 2015-01-20 ENCOUNTER — Emergency Department (HOSPITAL_COMMUNITY): Payer: Medicare Other

## 2015-01-20 ENCOUNTER — Inpatient Hospital Stay (HOSPITAL_COMMUNITY)
Admission: EM | Admit: 2015-01-20 | Discharge: 2015-01-29 | DRG: 640 | Disposition: A | Discharge status: Home Health | Payer: Medicare Other | Attending: Internal Medicine | Admitting: Internal Medicine

## 2015-01-20 ENCOUNTER — Encounter (HOSPITAL_COMMUNITY): Payer: Self-pay | Admitting: Emergency Medicine

## 2015-01-20 DIAGNOSIS — Z79899 Other long term (current) drug therapy: Secondary | ICD-10-CM

## 2015-01-20 DIAGNOSIS — Z95810 Presence of automatic (implantable) cardiac defibrillator: Secondary | ICD-10-CM

## 2015-01-20 DIAGNOSIS — Z6820 Body mass index (BMI) 20.0-20.9, adult: Secondary | ICD-10-CM

## 2015-01-20 DIAGNOSIS — E119 Type 2 diabetes mellitus without complications: Secondary | ICD-10-CM

## 2015-01-20 DIAGNOSIS — J449 Chronic obstructive pulmonary disease, unspecified: Secondary | ICD-10-CM | POA: Diagnosis present

## 2015-01-20 DIAGNOSIS — I2510 Atherosclerotic heart disease of native coronary artery without angina pectoris: Secondary | ICD-10-CM | POA: Diagnosis present

## 2015-01-20 DIAGNOSIS — I951 Orthostatic hypotension: Secondary | ICD-10-CM

## 2015-01-20 DIAGNOSIS — H3531 Nonexudative age-related macular degeneration: Secondary | ICD-10-CM | POA: Diagnosis present

## 2015-01-20 DIAGNOSIS — N183 Chronic kidney disease, stage 3 unspecified: Secondary | ICD-10-CM | POA: Diagnosis present

## 2015-01-20 DIAGNOSIS — E871 Hypo-osmolality and hyponatremia: Secondary | ICD-10-CM | POA: Diagnosis not present

## 2015-01-20 DIAGNOSIS — R531 Weakness: Secondary | ICD-10-CM

## 2015-01-20 DIAGNOSIS — S51012A Laceration without foreign body of left elbow, initial encounter: Secondary | ICD-10-CM

## 2015-01-20 DIAGNOSIS — R627 Adult failure to thrive: Secondary | ICD-10-CM | POA: Diagnosis present

## 2015-01-20 DIAGNOSIS — K219 Gastro-esophageal reflux disease without esophagitis: Secondary | ICD-10-CM | POA: Diagnosis present

## 2015-01-20 DIAGNOSIS — Z8249 Family history of ischemic heart disease and other diseases of the circulatory system: Secondary | ICD-10-CM

## 2015-01-20 DIAGNOSIS — R55 Syncope and collapse: Secondary | ICD-10-CM

## 2015-01-20 DIAGNOSIS — Z7189 Other specified counseling: Secondary | ICD-10-CM

## 2015-01-20 DIAGNOSIS — K761 Chronic passive congestion of liver: Secondary | ICD-10-CM | POA: Diagnosis present

## 2015-01-20 DIAGNOSIS — Z794 Long term (current) use of insulin: Secondary | ICD-10-CM

## 2015-01-20 DIAGNOSIS — E1121 Type 2 diabetes mellitus with diabetic nephropathy: Secondary | ICD-10-CM | POA: Diagnosis present

## 2015-01-20 DIAGNOSIS — Z77090 Contact with and (suspected) exposure to asbestos: Secondary | ICD-10-CM | POA: Diagnosis present

## 2015-01-20 DIAGNOSIS — R0781 Pleurodynia: Secondary | ICD-10-CM

## 2015-01-20 DIAGNOSIS — E1151 Type 2 diabetes mellitus with diabetic peripheral angiopathy without gangrene: Secondary | ICD-10-CM | POA: Diagnosis present

## 2015-01-20 DIAGNOSIS — I255 Ischemic cardiomyopathy: Secondary | ICD-10-CM | POA: Diagnosis present

## 2015-01-20 DIAGNOSIS — E43 Unspecified severe protein-calorie malnutrition: Secondary | ICD-10-CM | POA: Diagnosis present

## 2015-01-20 DIAGNOSIS — E785 Hyperlipidemia, unspecified: Secondary | ICD-10-CM | POA: Diagnosis present

## 2015-01-20 DIAGNOSIS — T502X5A Adverse effect of carbonic-anhydrase inhibitors, benzothiadiazides and other diuretics, initial encounter: Secondary | ICD-10-CM | POA: Diagnosis present

## 2015-01-20 DIAGNOSIS — I440 Atrioventricular block, first degree: Secondary | ICD-10-CM | POA: Diagnosis present

## 2015-01-20 DIAGNOSIS — Z951 Presence of aortocoronary bypass graft: Secondary | ICD-10-CM

## 2015-01-20 DIAGNOSIS — I4891 Unspecified atrial fibrillation: Secondary | ICD-10-CM | POA: Diagnosis present

## 2015-01-20 DIAGNOSIS — E780 Pure hypercholesterolemia: Secondary | ICD-10-CM | POA: Diagnosis present

## 2015-01-20 DIAGNOSIS — E860 Dehydration: Secondary | ICD-10-CM | POA: Diagnosis not present

## 2015-01-20 DIAGNOSIS — I4892 Unspecified atrial flutter: Secondary | ICD-10-CM | POA: Diagnosis present

## 2015-01-20 DIAGNOSIS — E1122 Type 2 diabetes mellitus with diabetic chronic kidney disease: Secondary | ICD-10-CM | POA: Diagnosis present

## 2015-01-20 DIAGNOSIS — I129 Hypertensive chronic kidney disease with stage 1 through stage 4 chronic kidney disease, or unspecified chronic kidney disease: Secondary | ICD-10-CM | POA: Diagnosis present

## 2015-01-20 DIAGNOSIS — I10 Essential (primary) hypertension: Secondary | ICD-10-CM | POA: Diagnosis present

## 2015-01-20 DIAGNOSIS — Z87891 Personal history of nicotine dependence: Secondary | ICD-10-CM

## 2015-01-20 DIAGNOSIS — Z515 Encounter for palliative care: Secondary | ICD-10-CM | POA: Insufficient documentation

## 2015-01-20 DIAGNOSIS — R64 Cachexia: Secondary | ICD-10-CM | POA: Diagnosis present

## 2015-01-20 DIAGNOSIS — R296 Repeated falls: Secondary | ICD-10-CM | POA: Diagnosis present

## 2015-01-20 DIAGNOSIS — I5022 Chronic systolic (congestive) heart failure: Secondary | ICD-10-CM | POA: Diagnosis present

## 2015-01-20 DIAGNOSIS — E039 Hypothyroidism, unspecified: Secondary | ICD-10-CM | POA: Diagnosis present

## 2015-01-20 DIAGNOSIS — R188 Other ascites: Secondary | ICD-10-CM | POA: Diagnosis present

## 2015-01-20 DIAGNOSIS — E11319 Type 2 diabetes mellitus with unspecified diabetic retinopathy without macular edema: Secondary | ICD-10-CM | POA: Diagnosis present

## 2015-01-20 DIAGNOSIS — D500 Iron deficiency anemia secondary to blood loss (chronic): Secondary | ICD-10-CM | POA: Diagnosis present

## 2015-01-20 LAB — CBC WITH DIFFERENTIAL/PLATELET
BASOS ABS: 0 10*3/uL (ref 0.0–0.1)
Basophils Relative: 0 % (ref 0–1)
EOS PCT: 0 % (ref 0–5)
Eosinophils Absolute: 0.1 10*3/uL (ref 0.0–0.7)
HCT: 28.7 % — ABNORMAL LOW (ref 39.0–52.0)
Hemoglobin: 10 g/dL — ABNORMAL LOW (ref 13.0–17.0)
Lymphocytes Relative: 8 % — ABNORMAL LOW (ref 12–46)
Lymphs Abs: 1.1 10*3/uL (ref 0.7–4.0)
MCH: 28.6 pg (ref 26.0–34.0)
MCHC: 34.8 g/dL (ref 30.0–36.0)
MCV: 82 fL (ref 78.0–100.0)
MONO ABS: 2.1 10*3/uL — AB (ref 0.1–1.0)
Monocytes Relative: 15 % — ABNORMAL HIGH (ref 3–12)
Neutro Abs: 11.1 10*3/uL — ABNORMAL HIGH (ref 1.7–7.7)
Neutrophils Relative %: 77 % (ref 43–77)
PLATELETS: 146 10*3/uL — AB (ref 150–400)
RBC: 3.5 MIL/uL — ABNORMAL LOW (ref 4.22–5.81)
RDW: 19 % — AB (ref 11.5–15.5)
WBC: 14.3 10*3/uL — ABNORMAL HIGH (ref 4.0–10.5)

## 2015-01-20 LAB — CREATININE, SERUM
Creatinine, Ser: 1.74 mg/dL — ABNORMAL HIGH (ref 0.61–1.24)
GFR calc non Af Amer: 36 mL/min — ABNORMAL LOW (ref >60)
GFR, EST AFRICAN AMERICAN: 42 mL/min — AB (ref >60)

## 2015-01-20 LAB — URINALYSIS, ROUTINE W REFLEX MICROSCOPIC
BILIRUBIN URINE: NEGATIVE
Glucose, UA: NEGATIVE mg/dL
Hgb urine dipstick: NEGATIVE
Ketones, ur: NEGATIVE mg/dL
Leukocytes, UA: NEGATIVE
NITRITE: NEGATIVE
PROTEIN: NEGATIVE mg/dL
SPECIFIC GRAVITY, URINE: 1.012 (ref 1.005–1.030)
UROBILINOGEN UA: 0.2 mg/dL (ref 0.0–1.0)
pH: 6 (ref 5.0–8.0)

## 2015-01-20 LAB — CBC
HEMATOCRIT: 30.1 % — AB (ref 39.0–52.0)
HEMOGLOBIN: 10.6 g/dL — AB (ref 13.0–17.0)
MCH: 29.5 pg (ref 26.0–34.0)
MCHC: 35.2 g/dL (ref 30.0–36.0)
MCV: 83.8 fL (ref 78.0–100.0)
Platelets: 131 10*3/uL — ABNORMAL LOW (ref 150–400)
RBC: 3.59 MIL/uL — ABNORMAL LOW (ref 4.22–5.81)
RDW: 19 % — AB (ref 11.5–15.5)
WBC: 12.9 10*3/uL — ABNORMAL HIGH (ref 4.0–10.5)

## 2015-01-20 LAB — BRAIN NATRIURETIC PEPTIDE: B NATRIURETIC PEPTIDE 5: 161.6 pg/mL — AB (ref 0.0–100.0)

## 2015-01-20 LAB — HEPATIC FUNCTION PANEL
ALBUMIN: 2.4 g/dL — AB (ref 3.5–5.0)
ALT: 30 U/L (ref 17–63)
AST: 82 U/L — AB (ref 15–41)
Alkaline Phosphatase: 108 U/L (ref 38–126)
BILIRUBIN DIRECT: 0.4 mg/dL (ref 0.1–0.5)
BILIRUBIN TOTAL: 1.4 mg/dL — AB (ref 0.3–1.2)
Indirect Bilirubin: 1 mg/dL — ABNORMAL HIGH (ref 0.3–0.9)
Total Protein: 6.3 g/dL — ABNORMAL LOW (ref 6.5–8.1)

## 2015-01-20 LAB — COMPREHENSIVE METABOLIC PANEL
ALBUMIN: 2.3 g/dL — AB (ref 3.5–5.0)
ALT: 30 U/L (ref 17–63)
AST: 92 U/L — AB (ref 15–41)
Alkaline Phosphatase: 108 U/L (ref 38–126)
Anion gap: 9 (ref 5–15)
BUN: 27 mg/dL — AB (ref 6–20)
CO2: 25 mmol/L (ref 22–32)
Calcium: 8.6 mg/dL — ABNORMAL LOW (ref 8.9–10.3)
Chloride: 87 mmol/L — ABNORMAL LOW (ref 101–111)
Creatinine, Ser: 1.81 mg/dL — ABNORMAL HIGH (ref 0.61–1.24)
GFR calc Af Amer: 40 mL/min — ABNORMAL LOW (ref >60)
GFR, EST NON AFRICAN AMERICAN: 34 mL/min — AB (ref >60)
GLUCOSE: 159 mg/dL — AB (ref 65–99)
POTASSIUM: 4.5 mmol/L (ref 3.5–5.1)
Sodium: 121 mmol/L — ABNORMAL LOW (ref 135–145)
Total Bilirubin: 1.5 mg/dL — ABNORMAL HIGH (ref 0.3–1.2)
Total Protein: 6 g/dL — ABNORMAL LOW (ref 6.5–8.1)

## 2015-01-20 LAB — TSH: TSH: 4.43 u[IU]/mL (ref 0.350–4.500)

## 2015-01-20 LAB — CORTISOL: Cortisol, Plasma: 10 ug/dL

## 2015-01-20 LAB — MAGNESIUM: Magnesium: 1.7 mg/dL (ref 1.7–2.4)

## 2015-01-20 LAB — GLUCOSE, CAPILLARY
GLUCOSE-CAPILLARY: 194 mg/dL — AB (ref 65–99)
Glucose-Capillary: 177 mg/dL — ABNORMAL HIGH (ref 65–99)

## 2015-01-20 MED ORDER — PANTOPRAZOLE SODIUM 40 MG PO TBEC
40.0000 mg | DELAYED_RELEASE_TABLET | Freq: Every day | ORAL | Status: DC
  Administered 2015-01-20 – 2015-01-29 (×10): 40 mg via ORAL
  Filled 2015-01-20 (×11): qty 1

## 2015-01-20 MED ORDER — ACETAMINOPHEN 650 MG RE SUPP: 650.0000 mg | Freq: Four times a day (QID) | RECTAL | Status: DC | PRN

## 2015-01-20 MED ORDER — GUAIFENESIN ER 600 MG PO TB12
600.0000 mg | ORAL_TABLET | Freq: Two times a day (BID) | ORAL | Status: DC
  Administered 2015-01-20 – 2015-01-29 (×19): 600 mg via ORAL
  Filled 2015-01-20 (×19): qty 1

## 2015-01-20 MED ORDER — PRAVASTATIN SODIUM 40 MG PO TABS
80.0000 mg | ORAL_TABLET | Freq: Every day | ORAL | Status: DC
  Administered 2015-01-20 – 2015-01-28 (×9): 80 mg via ORAL
  Filled 2015-01-20 (×9): qty 2

## 2015-01-20 MED ORDER — ONDANSETRON HCL 4 MG PO TABS: 4.0000 mg | ORAL_TABLET | Freq: Four times a day (QID) | ORAL | Status: DC | PRN

## 2015-01-20 MED ORDER — ACETAMINOPHEN 325 MG PO TABS
650.0000 mg | ORAL_TABLET | Freq: Four times a day (QID) | ORAL | Status: DC | PRN
  Administered 2015-01-22 – 2015-01-23 (×3): 650 mg via ORAL
  Filled 2015-01-20 (×3): qty 2

## 2015-01-20 MED ORDER — SODIUM CHLORIDE 0.9 % IV SOLN
INTRAVENOUS | Status: DC
  Administered 2015-01-20 – 2015-01-21 (×2): via INTRAVENOUS

## 2015-01-20 MED ORDER — INSULIN ASPART 100 UNIT/ML ~~LOC~~ SOLN
0.0000 [IU] | Freq: Three times a day (TID) | SUBCUTANEOUS | Status: DC
  Administered 2015-01-20: 2 [IU] via SUBCUTANEOUS
  Administered 2015-01-21: 1 [IU] via SUBCUTANEOUS
  Administered 2015-01-21 – 2015-01-22 (×3): 2 [IU] via SUBCUTANEOUS
  Administered 2015-01-22: 1 [IU] via SUBCUTANEOUS
  Administered 2015-01-22: 2 [IU] via SUBCUTANEOUS
  Administered 2015-01-23: 1 [IU] via SUBCUTANEOUS
  Administered 2015-01-23 (×2): 2 [IU] via SUBCUTANEOUS
  Administered 2015-01-24 (×2): 1 [IU] via SUBCUTANEOUS
  Administered 2015-01-24: 5 [IU] via SUBCUTANEOUS
  Administered 2015-01-25 (×3): 2 [IU] via SUBCUTANEOUS
  Administered 2015-01-26: 1 [IU] via SUBCUTANEOUS
  Administered 2015-01-26: 2 [IU] via SUBCUTANEOUS
  Administered 2015-01-27: 3 [IU] via SUBCUTANEOUS
  Administered 2015-01-27: 1 [IU] via SUBCUTANEOUS
  Administered 2015-01-27: 2 [IU] via SUBCUTANEOUS
  Administered 2015-01-28: 1 [IU] via SUBCUTANEOUS
  Administered 2015-01-28 (×2): 3 [IU] via SUBCUTANEOUS
  Administered 2015-01-29: 2 [IU] via SUBCUTANEOUS
  Administered 2015-01-29: 5 [IU] via SUBCUTANEOUS
  Filled 2015-01-20 (×2): qty 1

## 2015-01-20 MED ORDER — HEPARIN SODIUM (PORCINE) 5000 UNIT/ML IJ SOLN
5000.0000 [IU] | Freq: Three times a day (TID) | INTRAMUSCULAR | Status: DC
  Administered 2015-01-20 – 2015-01-29 (×26): 5000 [IU] via SUBCUTANEOUS
  Filled 2015-01-20 (×25): qty 1

## 2015-01-20 MED ORDER — LEVOTHYROXINE SODIUM 88 MCG PO TABS
88.0000 ug | ORAL_TABLET | Freq: Every day | ORAL | Status: DC
  Administered 2015-01-21 – 2015-01-29 (×9): 88 ug via ORAL
  Filled 2015-01-20 (×10): qty 1

## 2015-01-20 MED ORDER — TIOTROPIUM BROMIDE MONOHYDRATE 18 MCG IN CAPS
18.0000 ug | ORAL_CAPSULE | Freq: Every day | RESPIRATORY_TRACT | Status: DC
  Administered 2015-01-21 – 2015-01-29 (×9): 18 ug via RESPIRATORY_TRACT
  Filled 2015-01-20 (×2): qty 5

## 2015-01-20 MED ORDER — MAGNESIUM OXIDE 400 (241.3 MG) MG PO TABS
400.0000 mg | ORAL_TABLET | Freq: Three times a day (TID) | ORAL | Status: DC
  Administered 2015-01-20 – 2015-01-29 (×27): 400 mg via ORAL
  Filled 2015-01-20 (×27): qty 1

## 2015-01-20 MED ORDER — SODIUM CHLORIDE 0.9 % IJ SOLN
3.0000 mL | Freq: Two times a day (BID) | INTRAMUSCULAR | Status: DC
  Administered 2015-01-20 – 2015-01-29 (×16): 3 mL via INTRAVENOUS

## 2015-01-20 MED ORDER — INSULIN GLARGINE 100 UNIT/ML ~~LOC~~ SOLN
7.0000 [IU] | Freq: Every day | SUBCUTANEOUS | Status: DC
  Administered 2015-01-20 – 2015-01-28 (×9): 7 [IU] via SUBCUTANEOUS
  Filled 2015-01-20 (×10): qty 0.07

## 2015-01-20 MED ORDER — FOLIC ACID 1 MG PO TABS
1.0000 mg | ORAL_TABLET | Freq: Every day | ORAL | Status: DC
  Administered 2015-01-20 – 2015-01-29 (×10): 1 mg via ORAL
  Filled 2015-01-20 (×10): qty 1

## 2015-01-20 MED ORDER — AMIODARONE HCL 100 MG PO TABS
50.0000 mg | ORAL_TABLET | Freq: Every day | ORAL | Status: DC
  Administered 2015-01-20 – 2015-01-21 (×2): 50 mg via ORAL
  Filled 2015-01-20 (×2): qty 1

## 2015-01-20 MED ORDER — ONDANSETRON HCL 4 MG/2ML IJ SOLN: 4.0000 mg | Freq: Four times a day (QID) | INTRAMUSCULAR | Status: DC | PRN

## 2015-01-20 MED ORDER — MOMETASONE FURO-FORMOTEROL FUM 200-5 MCG/ACT IN AERO
2.0000 | INHALATION_SPRAY | Freq: Two times a day (BID) | RESPIRATORY_TRACT | Status: DC
  Administered 2015-01-20 – 2015-01-29 (×18): 2 via RESPIRATORY_TRACT
  Filled 2015-01-20: qty 8.8

## 2015-01-20 MED ORDER — SODIUM CHLORIDE 0.9 % IV BOLUS (SEPSIS)
500.0000 mL | Freq: Once | INTRAVENOUS | Status: AC
  Administered 2015-01-20: 500 mL via INTRAVENOUS

## 2015-01-20 NOTE — ED Provider Notes (Signed)
CSN: [id number]     Arrival date & time 01/20/15  1022 History   First MD Initiated Contact with Patient 01/20/15 1036     Chief Complaint  Patient presents with  . Loss of Consciousness     (Consider location/radiation/quality/duration/timing/severity/associated sxs/prior Treatment) Patient is a 79 y.o. male presenting with syncope.  Loss of Consciousness Episode history:  Single Most recent episode:  Today Timing:  Intermittent Progression:  Waxing and waning Chronicity:  Recurrent Context: standing up   Witnessed: no (heard)   Worsened by:  Posture Associated symptoms: chest pain (left chest wall with breathing/palpation), fever (100.0 this AM, no antipyretics prior to coming to ED) and recent fall   Associated symptoms: no difficulty breathing, no dizziness, no focal weakness, no headaches, no nausea, no recent surgery, no seizures, no shortness of breath, no vomiting and no weakness     Past Medical History  Diagnosis Date  . VENTRICULAR TACHYCARDIA   . PERIPHERAL VASCULAR DISEASE   . HYPERTENSION     Hypertensive retinopathy-grade II OU  . HYPERCHOLESTEROLEMIA   . CORONARY ARTERY DISEASE     Hx of CABG  . Chronic systolic heart failure     NYHA class IV: Ischemic cardiomyopathy (01/2014 EF 40-45% with hypokinesis + small area of akinesis; 08/2014, Echo with EF 40-45 percent, PAS 78.  11/2014 ECHO EF 25-30%, inferior akinesis, dilated LV, moderate mitral regurg.  . SEBORRHEIC DERMATITIS   . Chronic renal insufficiency, stage III (moderate)     CrCl 40s-50s  . DIVERTICULOSIS OF COLON   . COPD, severe   . COLONIC POLYPS   . ANXIETY   . Type II diabetes mellitus     +background diabetic retinopathy OU  . Chronic lower back pain   . DJD (degenerative joint disease)     "hands" (05/24/2012)  . Osteoarthritis of finger   . History of atrial flutter   . Thrombus 12/2013    on ICD lead--started anticoag and his cardioversion for atrial flutter was postponed.  . Colon  stricture 2015    with features worrisome for mass, also with recent rising CEA level (possible colon cancer)-GI MD= Dr. Jackelyn Hoehn has been declining colonoscopy since that time.  Dr. Liliane Channel impression on re-eval in 2015 was that the area represented chronic post-diverticulitis changes, not malignancy.  . Macular degeneration, age related, nonexudative     OU  . Chronic blood loss anemia     Transfused 2 U pRBCs April, May, and June (10/26/14) of 2016.  GI w/u has revealed only 2 tiny non-bleeding AVMs in mid small bowel.  Marland Kitchen AICD (automatic cardioverter/defibrillator) present    Past Surgical History  Procedure Laterality Date  . Coronary artery bypass graft  1990's    CABG X4  . Tonsillectomy  ?1942  . Cardiac defibrillator placement  12/2004    single chamber defibrillator- [employer]onic Maxima Dr Caryl Comes   . Tee without cardioversion N/A 12/22/2013    Procedure: TRANSESOPHAGEAL ECHOCARDIOGRAM (TEE);  Surgeon: Thayer Headings, MD;  Location: [facility];  Service: Cardiovascular;  Laterality: N/A;  . Cardioversion N/A 12/22/2013    Procedure: CARDIOVERSION;  Surgeon: Thayer Headings, MD;  Location: [facility] ENDOSCOPY;  Service: Cardiovascular;  Laterality: N/A;  . Cardiovascular stress test  01/12/13    myocard perf imaging: previous infarct with a moderately reduced EF as was known previously.  No new findings.   . Gastric emptying scan  02/02/14    Normal  . Transthoracic echocardiogram  01/25/14; 04/2014;11/2014  EF 40-45%, diffuse hypokinesis, infero-basilar myocardial akinesis, PA pressure slightly high, valves fine.04/2014-Echo today EF ~40% inferior AK moderate to severe MR. 2016 echo showed EF 25-30%, inferior akinesis, dilated LV, moderate mitral regurg  . Abdominal ultrasound  01/2014    GB sludge, o/w normal  . Ct virtual colonoscopy diagnostic  11/2012; repeat 09/2014    Asymmetric thickening of the rectosigmoid colon worrisome for colon cancer, but further eval by pt's GI MD led to conclusion  that this was likely sequela of recurrent diverticulitis.  Pt has refused colonoscopy to evaluate this region.  . Cardiovascular stress test      Lexiscan: There is a medium sized fixed inferior wall defect of moderate severity involving the apical inferior and mid inferior and basal inferoseptal segments, consistent with old inferior MI. No significant reversible ischemia.  EF 34%, inferior wall hypokinesis--intermediate risk scan (ok to do planned procedure)  . Implantable cardioverter defibrillator revision N/A 05/25/2012    Procedure: IMPLANTABLE CARDIOVERTER DEFIBRILLATOR REVISION;  Surgeon: Evans Lance, MD;  Location: [facility] CATH LAB;  Service: Cardiovascular;  Laterality: N/A;  . Right heart catheterization N/A 08/31/2014    Procedure: RIGHT HEART CATH;  Surgeon: Larey Dresser, MD;  Location: [facility] CATH LAB;  Service: Cardiovascular;  Laterality: N/A;  . Esophagogastroduodenoscopy N/A 09/03/2014    Candida esophagitis + retained gastric contents.  No bleeding. Procedure: ESOPHAGOGASTRODUODENOSCOPY (EGD);  Surgeon: Carol Ada, MD;  Location: [facility] ENDOSCOPY;  Service: Endoscopy;  Laterality: N/A;  . Givens capsule study N/A 10/12/2014    Procedure: GIVENS CAPSULE STUDY;  Surgeon: Irene Shipper, MD;  Location: [facility];  Service: Endoscopy;  Laterality: N/A;   Family History  Problem Relation Age of Onset  . Heart attack Father 100  . Heart attack Brother     Vague history   Social History  Substance Use Topics  . Smoking status: Former Smoker -- 2.00 packs/day for 50 years    Types: Cigarettes    Quit date: 06/24/2000  . Smokeless tobacco: Never Used  . Alcohol Use: 1.8 - 2.4 oz/week    0 Standard drinks or equivalent, 3-4 Cans of beer per week     Comment: 05/24/2012 "used to drink alot; quit > 10 yr ago"    Review of Systems  Constitutional: Positive for fever (100.0 this AM, no antipyretics prior to coming to ED) and fatigue.  HENT: Negative for congestion and sore throat.   Eyes:  Negative for visual disturbance.  Respiratory: Negative for shortness of breath.   Cardiovascular: Positive for chest pain (left chest wall with breathing/palpation) and syncope.  Gastrointestinal: Negative for nausea, vomiting, abdominal pain and diarrhea.  Genitourinary: Negative for difficulty urinating.  Musculoskeletal: Positive for arthralgias (L elbow). Negative for back pain, neck pain and neck stiffness.  Skin: Negative for rash.  Neurological: Positive for syncope and light-headedness. Negative for dizziness, focal weakness, seizures, facial asymmetry, speech difficulty, weakness, numbness and headaches.      Allergies  Niacin  Home Medications   Prior to Admission medications   Medication Sig Start Date End Date Taking? Authorizing Provider  amiodarone (PACERONE) 200 MG tablet Take 0.5 tablets (100 mg total) by mouth daily. 12/26/14  Yes Larey Dresser, MD  Fluticasone-Salmeterol (ADVAIR) 500-50 MCG/DOSE AEPB Inhale 1 puff into the lungs 2 (two) times daily. 04/05/14  Yes Noralee Space, MD  folic acid (FOLVITE) 1 MG tablet Take 1 tablet (1 mg total) by mouth daily. 11/22/14  Yes Tammi Sou, MD  guaiFENesin (  MUCINEX) 600 MG 12 hr tablet Take 1 tablet (600 mg total) by mouth 2 (two) times daily. 11/17/14  Yes Irene Pap, NP  Insulin Glargine (LANTUS SOLOSTAR) 100 UNIT/ML Solostar Pen 10 units SQ qhs Patient taking differently: Inject 10 Units into the skin at bedtime.  02/10/14  Yes Tammi Sou, MD  insulin lispro (HUMALOG KWIKPEN) 100 UNIT/ML KiwkPen Take 5 units in the morning, 5 units at noon, --if blood sugar over 100 and you eat > 50% of meals Patient taking differently: Inject 7-9 Units into the skin 3 (three) times daily. Take 7 units in the morning, 8 units at noon, and 9 units at dinner 10/18/14  Yes Ivan Anchors Love, PA-C  levothyroxine (SYNTHROID, LEVOTHROID) 88 MCG tablet Take 1 tablet (88 mcg total) by mouth daily. 10/25/14  Yes Tammi Sou, MD  magnesium  oxide (MAG-OX) 400 (241.3 MG) MG tablet Take 1 tablet (400 mg total) by mouth 3 (three) times daily. 01/09/15  Yes Tammi Sou, MD  pantoprazole (PROTONIX) 40 MG tablet Take 1 tablet (40 mg total) by mouth daily. 10/18/14  Yes Ivan Anchors Love, PA-C  potassium chloride SA (KLOR-CON M20) 20 MEQ tablet Take 1 tablet (20 mEq total) by mouth daily. 01/12/15  Yes Albertine Patricia, MD  pravastatin (PRAVACHOL) 80 MG tablet Take 1 tablet (80 mg total) by mouth at bedtime. 01/02/15  Yes Jolaine Artist, MD  tiotropium (SPIRIVA HANDIHALER) 18 MCG inhalation capsule PLACE ONE CAPSULE INTO INHALER AND  INHALE  DAILY 12/25/14  Yes Noralee Space, MD  torsemide (DEMADEX) 20 MG tablet Take 2 tablets (40 mg total) by mouth 2 (two) times daily. Patient taking differently: Take 40 mg by mouth 2 (two) times daily. To be resumed when weight reaches 137lb 01/15/15   Jolaine Artist, MD   BP 115/45 mmHg  Pulse 68  Temp(Src) 98.2 F (36.8 C) (Oral)  Resp 15  Ht 5\' 7"  (1.702 m)  Wt 128 lb 14.4 oz (58.469 kg)  BMI 20.18 kg/m2  SpO2 97% Physical Exam  Constitutional: He is oriented to person, place, and time. He appears well-developed and well-nourished. He appears cachectic. He appears ill (chronically). No distress.  HENT:  Head: Normocephalic and atraumatic.  Mouth/Throat: No oropharyngeal exudate.  Eyes: Conjunctivae and EOM are normal. Pupils are equal, round, and reactive to light.  Neck: Normal range of motion.  Cardiovascular: Normal rate, regular rhythm, normal heart sounds and intact distal pulses.  Exam reveals no gallop and no friction rub.   No murmur heard. Pulmonary/Chest: Effort normal and breath sounds normal. No respiratory distress. He has no wheezes. He has no rales. He exhibits tenderness (left lateral ribs).  Abdominal: Soft. He exhibits ascites (small amt). He exhibits no distension. There is no tenderness. There is no guarding.  Musculoskeletal: He exhibits no edema.  Neurological: He is  alert and oriented to person, place, and time.  Skin: Skin is warm and dry. He is not diaphoretic.  Skin tears L elbow 2 smaller 1 5cm long No extension into joint   Nursing note and vitals reviewed.   ED Course  Procedures (including critical care time) Labs Review Labs Reviewed  CBC WITH DIFFERENTIAL/PLATELET - Abnormal; Notable for the following:    WBC 14.3 (*)    RBC 3.50 (*)    Hemoglobin 10.0 (*)    HCT 28.7 (*)    RDW 19.0 (*)    Platelets 146 (*)    Neutro Abs 11.1 (*)  Lymphocytes Relative 8 (*)    Monocytes Relative 15 (*)    Monocytes Absolute 2.1 (*)    All other components within normal limits  COMPREHENSIVE METABOLIC PANEL - Abnormal; Notable for the following:    Sodium 121 (*)    Chloride 87 (*)    Glucose, Bld 159 (*)    BUN 27 (*)    Creatinine, Ser 1.81 (*)    Calcium 8.6 (*)    Total Protein 6.0 (*)    Albumin 2.3 (*)    AST 92 (*)    Total Bilirubin 1.5 (*)    GFR calc non Af Amer 34 (*)    GFR calc Af Amer 40 (*)    All other components within normal limits  BRAIN NATRIURETIC PEPTIDE - Abnormal; Notable for the following:    B Natriuretic Peptide 161.6 (*)    All other components within normal limits  CBC - Abnormal; Notable for the following:    WBC 12.9 (*)    RBC 3.59 (*)    Hemoglobin 10.6 (*)    HCT 30.1 (*)    RDW 19.0 (*)    Platelets 131 (*)    All other components within normal limits  CREATININE, SERUM - Abnormal; Notable for the following:    Creatinine, Ser 1.74 (*)    GFR calc non Af Amer 36 (*)    GFR calc Af Amer 42 (*)    All other components within normal limits  HEPATIC FUNCTION PANEL - Abnormal; Notable for the following:    Total Protein 6.3 (*)    Albumin 2.4 (*)    AST 82 (*)    Total Bilirubin 1.4 (*)    Indirect Bilirubin 1.0 (*)    All other components within normal limits  GLUCOSE, CAPILLARY - Abnormal; Notable for the following:    Glucose-Capillary 194 (*)    All other components within normal limits   GLUCOSE, CAPILLARY - Abnormal; Notable for the following:    Glucose-Capillary 177 (*)    All other components within normal limits  URINE CULTURE  MAGNESIUM  URINALYSIS, ROUTINE W REFLEX MICROSCOPIC (NOT AT [facility])  TSH  CORTISOL  BASIC METABOLIC PANEL  CBC    Imaging Review Dg Chest 2 View  01/20/2015   CLINICAL DATA:  Dizziness, fell 1 day ago, history hypertension, type II diabetes mellitus, coronary artery disease post CABG, chronic systolic heart failure, stage III chronic renal insufficiency  EXAM: CHEST  2 VIEW  COMPARISON:  01/10/2015  FINDINGS: LEFT subclavian AICD with leads projecting at RIGHT ventricle.  Upper normal heart size post CABG.  Mediastinal contours pulmonary vascularity normal.  Atherosclerotic calcification aorta.  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  Emphysematous and chronic bronchitic changes with chronic accentuation of interstitial markings in the mid to lower lungs.  No acute infiltrate, pleural effusion, or pneumothorax.  Bones demineralized.  IMPRESSION: Changes of COPD and likely prior asbestos exposure.  Post CABG and AICD.  No acute abnormalities.   Electronically Signed   By: Lavonia Dana M.D.   On: 01/20/2015 11:50   Dg Elbow 2 Views Left  01/20/2015   CLINICAL DATA:  Initial encounter for posterior left elbow pain after fall. On blood thinners.  EXAM: LEFT ELBOW - 2 VIEW  COMPARISON:  None.  FINDINGS: AP and lateral views. Soft tissue injury superficial to the olecranon and proximal humerus. No acute fracture or dislocation. No joint effusion. Mild osteoarthritis involves the coronoid process of the ulna.  IMPRESSION: Soft tissue injury only.   Electronically Signed   By: Abigail Miyamoto M.D.   On: 01/20/2015 11:48   I have personally reviewed and evaluated these images and lab results as part of my medical decision-making.   EKG Interpretation   Date/Time:  Saturday January 20 2015 10:56:36 EDT Ventricular Rate:  74 PR  Interval:  181 QRS Duration: 176 QT Interval:  476 QTC Calculation: 528 R Axis:   93 Text Interpretation:  Sinus rhythm Consider left ventricular hypertrophy  Abnormal T, consider ischemia, lateral leads Prolonged QT interval No  significant change since last tracing Confirmed by [facility] MD, Johnette Teigen  ([id number]) on 01/20/2015 8:43:06 PM      MDM   Final diagnoses:  Dehydration with hyponatremia  Syncope, unspecified syncope type  Skin tear of elbow without complication, left, initial encounter  Rib pain on left side  Generalized weakness   79 year old male with a history of COPD, chronic systolic CHF class IV, CK D stage III, diabetes, ascites with last paracentesis weeks ago, hypertension, hyperlipidemia, recent admission to the hospital for dehydration with hyponatremia presents with concern for generalized weakness and syncope. DDx for syncope includes cardiac arrhythmia, MI, PE, electrolyte abnormality, hypovolemia including dehydration and anemia/GI bleed, infection. Pacemaker interrogated showing no significant arrhythmia.  EKG was a vitamin E and showed no significant change from previous.  Patient without chest pain or shortness of breath and doubt MI or PE. He does appear dry on exam, and labs show hyponatremia with a sodium of 121.  Per family there is a miscommunication regarding his torsemide dosages at time of discharge from hospital and they had continued giving torsemide until this Tuesday. Feel that patient's worsened hyponatremia, syncope and dehydration are likely secondary to torsemide. Given 500cc NS. Patient denies headache, head trauma, is not on anticoagulation and have low suspicion for intracranial injury.  He does not have midline neck pain, no numbness, weakness and low suspicion for cervical injury.  L elbow wounds irrigated and closed with steri strips, elbow xr neg, wounds not deep enough to communicate with joint. UTD on tetanus. Chest x-ray does not show signs of  pneumothorax or rib fractures and given pain level feel pt more likely with contusion than fx.  Pt with generalized weakness, leukocytosis, however cannot determine source of infection at this time. His urinalysis is negative, chest x-ray shows no pneumonia, he does not have any abdominal pain or tenderness to suggest SBP.  Patient will be admitted to the hospitalist for continued care for hyponatremia, dehydration and syncope.   Gareth Morgan, MD 01/20/15 2056

## 2015-01-20 NOTE — ED Notes (Signed)
PER EMS: pt comes from home after syncopal episode lasting a few seconds.  [name] wife heard and found pt on floor after he was standing to ambulate.  Pt uses walker and has been getting up to use bathroom more frequently.  Pt states he became weak and passed out - had similar episode 2 days ago - was seen by EMS but didn't seek treatment at that time.  Pt denies head/neck pain.  C/o L rib cage pain when taking deep breath and L elbow pain.  Bilateral healing wounds on both elbows - L side opened up and bandaged by EMS.  Pt was placed on albuterol treatment by EMS for comfort (extensive cardiac hx and hx COPD).  Pt also states his torsemide was recently increased in dosage.  NAD at this time.

## 2015-01-20 NOTE — ED Notes (Signed)
MD at bedside. 

## 2015-01-20 NOTE — H&P (Signed)
[facility] History and Physical  [name] KTG:[id number] DOB: 06-17-1935 DOA: 01/20/2015  Referring physician: Dr. Billy Fischer PCP: Tammi Sou, MD   Chief Complaint: syncope, dehydration and hyponatremia  HPI: [name] is a 79 y.o. male with PMH significant for CHF (Systolic in nature, NYHA class 4; last EF 25-30%), HTN, hx of A. Fib, ventricular tachycardia, AICD implantation, HLD, diabetes with nephropathy and CKD stage 3; who presented to ED after episode of syncope. Patient reported no prodromic symptoms when falling and endorses he just black out in his way to the bathroom while using his walker. Wife found him down and denies bladder or stool incontinence; there was also no post ictal episode. Patient was severely weak and EMS was called. Family reported increase urine frequency and just recently stopped torsemide, even was instructed on discharge last admission not to given unless weight above 137. Patient denies CP, fever, chills, dysuria, nausea, vomiting, abd pain, HA's and focal weakness. Family struggling providing degree of assistance and care that patient is demanding now; and per HHPT he is extremely deconditioned. In ED workup revealed signs of dehydration on PE, positive hyponatremia at level of 121, stable Creatinine and no acute infiltrates on CXR, an no acute intracranial abnormalities on CT head. TRH called to admit for further evaluation and treatment of syncope, dehydration and hyponatremia.     Review of Systems:  Negative except as mentioned on HPI.  Past Medical History  Diagnosis Date  . VENTRICULAR TACHYCARDIA   . PERIPHERAL VASCULAR DISEASE   . HYPERTENSION     Hypertensive retinopathy-grade II OU  . HYPERCHOLESTEROLEMIA   . CORONARY ARTERY DISEASE     Hx of CABG  . Chronic systolic heart failure     NYHA class IV: Ischemic cardiomyopathy (01/2014 EF 40-45% with hypokinesis + small area of akinesis; 08/2014, Echo with EF 40-45  percent, PAS 78.  11/2014 ECHO EF 25-30%, inferior akinesis, dilated LV, moderate mitral regurg.  . SEBORRHEIC DERMATITIS   . Chronic renal insufficiency, stage III (moderate)     CrCl 40s-50s  . DIVERTICULOSIS OF COLON   . COPD, severe   . COLONIC POLYPS   . ANXIETY   . Type II diabetes mellitus     +background diabetic retinopathy OU  . Chronic lower back pain   . DJD (degenerative joint disease)     "hands" (05/24/2012)  . Osteoarthritis of finger   . History of atrial flutter   . Thrombus 12/2013    on ICD lead--started anticoag and his cardioversion for atrial flutter was postponed.  . Colon stricture 2015    with features worrisome for mass, also with recent rising CEA level (possible colon cancer)-GI MD= Dr. Jackelyn Hoehn has been declining colonoscopy since that time.  Dr. Liliane Channel impression on re-eval in 2015 was that the area represented chronic post-diverticulitis changes, not malignancy.  . Macular degeneration, age related, nonexudative     OU  . Chronic blood loss anemia     Transfused 2 U pRBCs April, May, and June (10/26/14) of 2016.  GI w/u has revealed only 2 tiny non-bleeding AVMs in mid small bowel.  Marland Kitchen AICD (automatic cardioverter/defibrillator) present    Past Surgical History  Procedure Laterality Date  . Coronary artery bypass graft  1990's    CABG X4  . Tonsillectomy  ?1942  . Cardiac defibrillator placement  12/2004    single chamber defibrillator- [employer]tronic Maxima Dr Caryl Comes   . Tee without cardioversion N/A 12/22/2013    Procedure:  TRANSESOPHAGEAL ECHOCARDIOGRAM (TEE);  Surgeon: Thayer Headings, MD;  Location: [place];  Service: Cardiovascular;  Laterality: N/A;  . Cardioversion N/A 12/22/2013    Procedure: CARDIOVERSION;  Surgeon: Thayer Headings, MD;  Location: [facility] ENDOSCOPY;  Service: Cardiovascular;  Laterality: N/A;  . Cardiovascular stress test  01/12/13    myocard perf imaging: previous infarct with a moderately reduced EF as was known previously.  No  new findings.   . Gastric emptying scan  02/02/14    Normal  . Transthoracic echocardiogram  01/25/14; 04/2014;11/2014    EF 40-45%, diffuse hypokinesis, infero-basilar myocardial akinesis, PA pressure slightly high, valves fine.04/2014-Echo today EF ~40% inferior AK moderate to severe MR. 2016 echo showed EF 25-30%, inferior akinesis, dilated LV, moderate mitral regurg  . Abdominal ultrasound  01/2014    GB sludge, o/w normal  . Ct virtual colonoscopy diagnostic  11/2012; repeat 09/2014    Asymmetric thickening of the rectosigmoid colon worrisome for colon cancer, but further eval by pt's GI MD led to conclusion that this was likely sequela of recurrent diverticulitis.  Pt has refused colonoscopy to evaluate this region.  . Cardiovascular stress test      Lexiscan: There is a medium sized fixed inferior wall defect of moderate severity involving the apical inferior and mid inferior and basal inferoseptal segments, consistent with old inferior MI. No significant reversible ischemia.  EF 34%, inferior wall hypokinesis--intermediate risk scan (ok to do planned procedure)  . Implantable cardioverter defibrillator revision N/A 05/25/2012    Procedure: IMPLANTABLE CARDIOVERTER DEFIBRILLATOR REVISION;  Surgeon: Evans Lance, MD;  Location: [facility] CATH LAB;  Service: Cardiovascular;  Laterality: N/A;  . Right heart catheterization N/A 08/31/2014    Procedure: RIGHT HEART CATH;  Surgeon: Larey Dresser, MD;  Location: [facility] CATH LAB;  Service: Cardiovascular;  Laterality: N/A;  . Esophagogastroduodenoscopy N/A 09/03/2014    Candida esophagitis + retained gastric contents.  No bleeding. Procedure: ESOPHAGOGASTRODUODENOSCOPY (EGD);  Surgeon: Carol Ada, MD;  Location: [facility] ENDOSCOPY;  Service: Endoscopy;  Laterality: N/A;  . Givens capsule study N/A 10/12/2014    Procedure: GIVENS CAPSULE STUDY;  Surgeon: Irene Shipper, MD;  Location: [place];  Service: Endoscopy;  Laterality: N/A;   Social History:  reports that he  quit smoking about 14 years ago. His smoking use included Cigarettes. He has a 100 pack-year smoking history. He has never used smokeless tobacco. He reports that he drinks alcohol. He reports that he does not use illicit drugs.  Allergies  Allergen Reactions  . Niacin Itching    Niaspan     Family History  Problem Relation Age of Onset  . Heart attack Father 40  . Heart attack Brother     Vague history    Prior to Admission medications   Medication Sig Start Date End Date Taking? Authorizing Provider  amiodarone (PACERONE) 200 MG tablet Take 0.5 tablets (100 mg total) by mouth daily. 12/26/14  Yes Larey Dresser, MD  Fluticasone-Salmeterol (ADVAIR) 500-50 MCG/DOSE AEPB Inhale 1 puff into the lungs 2 (two) times daily. 04/05/14  Yes Noralee Space, MD  folic acid (FOLVITE) 1 MG tablet Take 1 tablet (1 mg total) by mouth daily. 11/22/14  Yes Tammi Sou, MD  guaiFENesin (MUCINEX) 600 MG 12 hr tablet Take 1 tablet (600 mg total) by mouth 2 (two) times daily. 11/17/14  Yes Irene Pap, NP  Insulin Glargine (LANTUS SOLOSTAR) 100 UNIT/ML Solostar Pen 10 units SQ qhs Patient taking differently: Inject 10 Units  into the skin at bedtime.  02/10/14  Yes Tammi Sou, MD  insulin lispro (HUMALOG KWIKPEN) 100 UNIT/ML KiwkPen Take 5 units in the morning, 5 units at noon, --if blood sugar over 100 and you eat > 50% of meals Patient taking differently: Inject 7-9 Units into the skin 3 (three) times daily. Take 7 units in the morning, 8 units at noon, and 9 units at dinner 10/18/14  Yes Ivan Anchors Love, PA-C  levothyroxine (SYNTHROID, LEVOTHROID) 88 MCG tablet Take 1 tablet (88 mcg total) by mouth daily. 10/25/14  Yes Tammi Sou, MD  magnesium oxide (MAG-OX) 400 (241.3 MG) MG tablet Take 1 tablet (400 mg total) by mouth 3 (three) times daily. 01/09/15  Yes Tammi Sou, MD  pantoprazole (PROTONIX) 40 MG tablet Take 1 tablet (40 mg total) by mouth daily. 10/18/14  Yes Ivan Anchors Love, PA-C    potassium chloride SA (KLOR-CON M20) 20 MEQ tablet Take 1 tablet (20 mEq total) by mouth daily. 01/12/15  Yes Albertine Patricia, MD  pravastatin (PRAVACHOL) 80 MG tablet Take 1 tablet (80 mg total) by mouth at bedtime. 01/02/15  Yes Jolaine Artist, MD  tiotropium (SPIRIVA HANDIHALER) 18 MCG inhalation capsule PLACE ONE CAPSULE INTO INHALER AND  INHALE  DAILY 12/25/14  Yes Noralee Space, MD  torsemide (DEMADEX) 20 MG tablet Take 2 tablets (40 mg total) by mouth 2 (two) times daily. Patient taking differently: Take 40 mg by mouth 2 (two) times daily. To be resumed when weight reaches 137lb 01/15/15   Jolaine Artist, MD   Physical Exam: Filed Vitals:   01/20/15 1255 01/20/15 1315 01/20/15 1330 01/20/15 1354  BP:    119/42  Pulse: 71 71 70 68  Temp:      TempSrc:      Resp:      SpO2: 97% 97% 95% 95%    Wt Readings from Last 3 Encounters:  01/12/15 60.555 kg (133 lb 8 oz)  01/02/15 64.864 kg (143 lb)  01/01/15 64.048 kg (141 lb 3.2 oz)    General:  Appears calm and comfortable; afebrile and currently denies CP, palpitations or any focal deficit. Patient appears underweight, frail and chronically ill. Eyes: PERRL, normal lids, irises & conjunctiva, no icterus, no nystagmus ENT: grossly normal hearing, dry MM, no erythema or exudates; no drainage out of ears or nostrils Neck: no LAD, masses or thyromegaly, no JVD Cardiovascular: RRR, no rubs or gallops; No LE edema. Respiratory: good air movement bilaterally, no wheezing, no rales. posiitve scattered rhonchi. Normal respiratory effort. Abdomen: soft, nt, nd, positive BS Skin: no rash or induration seen on exam; positive open abrasions especially on his left elbow Musculoskeletal: grossly normal tone BUE/BLE Psychiatric: grossly normal mood and affect, speech fluent and appropriate Neurologic: grossly non-focal.          Labs on Admission:  Basic Metabolic Panel:  Recent Labs Lab 01/20/15 1033  NA 121*  K 4.5  CL 87*  CO2  25  GLUCOSE 159*  BUN 27*  CREATININE 1.81*  CALCIUM 8.6*  MG 1.7   Liver Function Tests:  Recent Labs Lab 01/20/15 1033  AST 92*  ALT 30  ALKPHOS 108  BILITOT 1.5*  PROT 6.0*  ALBUMIN 2.3*   CBC:  Recent Labs Lab 01/20/15 1033  WBC 14.3*  NEUTROABS 11.1*  HGB 10.0*  HCT 28.7*  MCV 82.0  PLT 146*   BNP (last 3 results)  Recent Labs  12/02/14 1800 01/10/15 1730 01/20/15  1033  BNP 262.1* 304.5* 161.6*    ProBNP (last 3 results)  Recent Labs  01/24/14 1909 02/09/14 0945  PROBNP 6721.0* 2555.0*   Radiological Exams on Admission: Dg Chest 2 View  01/20/2015   CLINICAL DATA:  Dizziness, fell 1 day ago, history hypertension, type II diabetes mellitus, coronary artery disease post CABG, chronic systolic heart failure, stage III chronic renal insufficiency  EXAM: CHEST  2 VIEW  COMPARISON:  01/10/2015  FINDINGS: LEFT subclavian AICD with leads projecting at RIGHT ventricle.  Upper normal heart size post CABG.  Mediastinal contours pulmonary vascularity normal.  Atherosclerotic calcification aorta.  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  Emphysematous and chronic bronchitic changes with chronic accentuation of interstitial markings in the mid to lower lungs.  No acute infiltrate, pleural effusion, or pneumothorax.  Bones demineralized.  IMPRESSION: Changes of COPD and likely prior asbestos exposure.  Post CABG and AICD.  No acute abnormalities.   Electronically Signed   By: Lavonia Dana M.D.   On: 01/20/2015 11:50   Dg Elbow 2 Views Left  01/20/2015   CLINICAL DATA:  Initial encounter for posterior left elbow pain after fall. On blood thinners.  EXAM: LEFT ELBOW - 2 VIEW  COMPARISON:  None.  FINDINGS: AP and lateral views. Soft tissue injury superficial to the olecranon and proximal humerus. No acute fracture or dislocation. No joint effusion. Mild osteoarthritis involves the coronoid process of the ulna.  IMPRESSION: Soft tissue injury only.    Electronically Signed   By: Abigail Miyamoto M.D.   On: 01/20/2015 11:48    EKG:  No acute ischemic changes; first degree block and sinus rhythm  Assessment/Plan 1-syncope: Appears to be secondary to dehydration -Patient will be admitted to telemetry -Will interrogate his pacemaker -Will provide fluid resuscitation (will be very judicious given hx of chronic systolic heart failure) -Will follow his volume status closely and will check daily weights/a straight intake and output -Will check orthostatic vital signs, TSH and cortisol level. -follow clinical response  2-Essential hypertension: BP is stable. -will hold torsemide  -heart diet ordered -gentle hydration initiated -close follow up of his volume status  3-Chronic systolic CHF (congestive heart failure), NYHA class 4: compensated; in fact patient is dry on PE -will hold torsemide -follow daily weights and strict intake and output -no LE swelling, no JVD and no crackles on exam  4-CKD (chronic kidney disease) stage 3, GFR 30-59 ml/min: creatinine appears to be at baseline essentially -will follow renal function trend after hydration -holding torsemide -UA no suggesting infection  5-Chronic hyponatremia: normally in the 128-130 range. On admission 121. Due to use of torsemide and chronic renal failure -will provide gentle IVF's -will follow BMET -will check TSH and cortisol level -holding diuretics  6-Diabetes mellitus type 2, controlled: will continue lantus (but just 7 units) and SSI -follow CBG's and adjust hypoglycemic regimen as needed  7-Dehydration with hyponatremia: due to use of diuretics -will provide fluid resuscitation and follow orthostatic changes and volume status  8-Weakness generalized: due to electrolytes abnormalities and deconditioning  -will replete electrolytes and will ask for PT/OT evaluation  9-Hypothyroidism: will check TSH -continue synthroid  10-GERD without esophagitis: continue  PPI  11-HLD (hyperlipidemia): continue statins  12-hx of A. Fib: patient on sinus rhythm and with first degree block -will continue amiodarone but, will reduce it to 50mg  daily  13-protein calorie malnutrition: severe -will ask dietitian to assess calorie intake and provide rec's on supplementations.  Code Status: Full  code DVT Prophylaxis: Heparin Family Communication: Wife and daughter at bedside Disposition Plan: Observation,LOS < 2 midnight, telemetry  Time spent: 50 minutes  Barton Dubois [facility] Pager [phone]

## 2015-01-21 DIAGNOSIS — R627 Adult failure to thrive: Secondary | ICD-10-CM | POA: Diagnosis present

## 2015-01-21 DIAGNOSIS — K761 Chronic passive congestion of liver: Secondary | ICD-10-CM | POA: Diagnosis present

## 2015-01-21 DIAGNOSIS — I440 Atrioventricular block, first degree: Secondary | ICD-10-CM | POA: Diagnosis present

## 2015-01-21 DIAGNOSIS — E860 Dehydration: Secondary | ICD-10-CM | POA: Diagnosis present

## 2015-01-21 DIAGNOSIS — R188 Other ascites: Secondary | ICD-10-CM | POA: Diagnosis present

## 2015-01-21 DIAGNOSIS — Z87891 Personal history of nicotine dependence: Secondary | ICD-10-CM | POA: Diagnosis not present

## 2015-01-21 DIAGNOSIS — E780 Pure hypercholesterolemia: Secondary | ICD-10-CM | POA: Diagnosis present

## 2015-01-21 DIAGNOSIS — H3531 Nonexudative age-related macular degeneration: Secondary | ICD-10-CM | POA: Diagnosis present

## 2015-01-21 DIAGNOSIS — E43 Unspecified severe protein-calorie malnutrition: Secondary | ICD-10-CM | POA: Diagnosis present

## 2015-01-21 DIAGNOSIS — Z79899 Other long term (current) drug therapy: Secondary | ICD-10-CM | POA: Diagnosis not present

## 2015-01-21 DIAGNOSIS — I2510 Atherosclerotic heart disease of native coronary artery without angina pectoris: Secondary | ICD-10-CM | POA: Diagnosis present

## 2015-01-21 DIAGNOSIS — Z515 Encounter for palliative care: Secondary | ICD-10-CM | POA: Diagnosis not present

## 2015-01-21 DIAGNOSIS — I4891 Unspecified atrial fibrillation: Secondary | ICD-10-CM | POA: Diagnosis present

## 2015-01-21 DIAGNOSIS — N183 Chronic kidney disease, stage 3 (moderate): Secondary | ICD-10-CM | POA: Diagnosis present

## 2015-01-21 DIAGNOSIS — D500 Iron deficiency anemia secondary to blood loss (chronic): Secondary | ICD-10-CM | POA: Diagnosis present

## 2015-01-21 DIAGNOSIS — I129 Hypertensive chronic kidney disease with stage 1 through stage 4 chronic kidney disease, or unspecified chronic kidney disease: Secondary | ICD-10-CM | POA: Diagnosis present

## 2015-01-21 DIAGNOSIS — E871 Hypo-osmolality and hyponatremia: Secondary | ICD-10-CM | POA: Diagnosis present

## 2015-01-21 DIAGNOSIS — E039 Hypothyroidism, unspecified: Secondary | ICD-10-CM | POA: Diagnosis present

## 2015-01-21 DIAGNOSIS — E1151 Type 2 diabetes mellitus with diabetic peripheral angiopathy without gangrene: Secondary | ICD-10-CM | POA: Diagnosis present

## 2015-01-21 DIAGNOSIS — I951 Orthostatic hypotension: Secondary | ICD-10-CM | POA: Diagnosis present

## 2015-01-21 DIAGNOSIS — E11319 Type 2 diabetes mellitus with unspecified diabetic retinopathy without macular edema: Secondary | ICD-10-CM | POA: Diagnosis present

## 2015-01-21 DIAGNOSIS — Z95810 Presence of automatic (implantable) cardiac defibrillator: Secondary | ICD-10-CM | POA: Diagnosis not present

## 2015-01-21 DIAGNOSIS — I4892 Unspecified atrial flutter: Secondary | ICD-10-CM | POA: Diagnosis present

## 2015-01-21 DIAGNOSIS — K219 Gastro-esophageal reflux disease without esophagitis: Secondary | ICD-10-CM | POA: Diagnosis present

## 2015-01-21 DIAGNOSIS — R64 Cachexia: Secondary | ICD-10-CM | POA: Diagnosis present

## 2015-01-21 DIAGNOSIS — E1122 Type 2 diabetes mellitus with diabetic chronic kidney disease: Secondary | ICD-10-CM | POA: Diagnosis present

## 2015-01-21 DIAGNOSIS — R55 Syncope and collapse: Secondary | ICD-10-CM | POA: Diagnosis not present

## 2015-01-21 DIAGNOSIS — Z794 Long term (current) use of insulin: Secondary | ICD-10-CM | POA: Diagnosis not present

## 2015-01-21 DIAGNOSIS — E119 Type 2 diabetes mellitus without complications: Secondary | ICD-10-CM | POA: Diagnosis not present

## 2015-01-21 DIAGNOSIS — I5022 Chronic systolic (congestive) heart failure: Secondary | ICD-10-CM | POA: Diagnosis present

## 2015-01-21 DIAGNOSIS — R296 Repeated falls: Secondary | ICD-10-CM | POA: Diagnosis present

## 2015-01-21 DIAGNOSIS — Z951 Presence of aortocoronary bypass graft: Secondary | ICD-10-CM | POA: Diagnosis not present

## 2015-01-21 DIAGNOSIS — E785 Hyperlipidemia, unspecified: Secondary | ICD-10-CM | POA: Diagnosis present

## 2015-01-21 DIAGNOSIS — E1121 Type 2 diabetes mellitus with diabetic nephropathy: Secondary | ICD-10-CM | POA: Diagnosis present

## 2015-01-21 DIAGNOSIS — J449 Chronic obstructive pulmonary disease, unspecified: Secondary | ICD-10-CM | POA: Diagnosis present

## 2015-01-21 DIAGNOSIS — T502X5A Adverse effect of carbonic-anhydrase inhibitors, benzothiadiazides and other diuretics, initial encounter: Secondary | ICD-10-CM | POA: Diagnosis present

## 2015-01-21 DIAGNOSIS — R531 Weakness: Secondary | ICD-10-CM | POA: Diagnosis not present

## 2015-01-21 DIAGNOSIS — I255 Ischemic cardiomyopathy: Secondary | ICD-10-CM | POA: Diagnosis present

## 2015-01-21 DIAGNOSIS — Z7189 Other specified counseling: Secondary | ICD-10-CM | POA: Diagnosis not present

## 2015-01-21 DIAGNOSIS — Z8249 Family history of ischemic heart disease and other diseases of the circulatory system: Secondary | ICD-10-CM | POA: Diagnosis not present

## 2015-01-21 DIAGNOSIS — Z77090 Contact with and (suspected) exposure to asbestos: Secondary | ICD-10-CM | POA: Diagnosis present

## 2015-01-21 DIAGNOSIS — Z6820 Body mass index (BMI) 20.0-20.9, adult: Secondary | ICD-10-CM | POA: Diagnosis not present

## 2015-01-21 LAB — BASIC METABOLIC PANEL
ANION GAP: 10 (ref 5–15)
BUN: 20 mg/dL (ref 6–20)
CHLORIDE: 91 mmol/L — AB (ref 101–111)
CO2: 24 mmol/L (ref 22–32)
Calcium: 8.4 mg/dL — ABNORMAL LOW (ref 8.9–10.3)
Creatinine, Ser: 1.39 mg/dL — ABNORMAL HIGH (ref 0.61–1.24)
GFR calc Af Amer: 55 mL/min — ABNORMAL LOW (ref >60)
GFR, EST NON AFRICAN AMERICAN: 47 mL/min — AB (ref >60)
Glucose, Bld: 139 mg/dL — ABNORMAL HIGH (ref 65–99)
POTASSIUM: 3.7 mmol/L (ref 3.5–5.1)
SODIUM: 125 mmol/L — AB (ref 135–145)

## 2015-01-21 LAB — CBC
HEMATOCRIT: 27.3 % — AB (ref 39.0–52.0)
HEMOGLOBIN: 9.4 g/dL — AB (ref 13.0–17.0)
MCH: 28.7 pg (ref 26.0–34.0)
MCHC: 34.4 g/dL (ref 30.0–36.0)
MCV: 83.2 fL (ref 78.0–100.0)
Platelets: 137 10*3/uL — ABNORMAL LOW (ref 150–400)
RBC: 3.28 MIL/uL — AB (ref 4.22–5.81)
RDW: 19.2 % — ABNORMAL HIGH (ref 11.5–15.5)
WBC: 11.1 10*3/uL — AB (ref 4.0–10.5)

## 2015-01-21 LAB — URINE CULTURE

## 2015-01-21 LAB — GLUCOSE, CAPILLARY
GLUCOSE-CAPILLARY: 136 mg/dL — AB (ref 65–99)
Glucose-Capillary: 180 mg/dL — ABNORMAL HIGH (ref 65–99)
Glucose-Capillary: 164 mg/dL — ABNORMAL HIGH (ref 65–99)
Glucose-Capillary: 169 mg/dL — ABNORMAL HIGH (ref 65–99)
Glucose-Capillary: 116 mg/dL — ABNORMAL HIGH (ref 65–99)

## 2015-01-21 MED ORDER — AMIODARONE HCL 200 MG PO TABS
100.0000 mg | ORAL_TABLET | Freq: Every day | ORAL | Status: DC
  Administered 2015-01-22 – 2015-01-29 (×8): 100 mg via ORAL
  Filled 2015-01-21 (×8): qty 1

## 2015-01-21 MED ORDER — OXYCODONE-ACETAMINOPHEN 5-325 MG PO TABS
2.0000 | ORAL_TABLET | Freq: Once | ORAL | Status: AC
  Administered 2015-01-22: 2 via ORAL
  Filled 2015-01-21: qty 2

## 2015-01-21 NOTE — Evaluation (Signed)
Physical Therapy Evaluation Patient Details Name: [name] MRN: [id number] DOB: 1935/07/22 Today's Date: 01/21/2015   History of Present Illness  Pt is a 78 y/o M admitted after episode of syncope, wife found him on the floor.  [name] PMH includes ventricular tachycardia, HTN, PVD, CAD, COPD, anxiety, DMII, chronic LBP.  Clinical Impression  Pt admitted with above diagnosis. Pt currently with functional limitations due to the deficits listed below (see PT Problem List). [name] demonstrates mild instability w/ ambulation and will need HHPT upon d/c.  Patient needs to practice stairs next session. Pt will benefit from skilled PT to increase their independence and safety with mobility to allow discharge to the venue listed below.      Follow Up Recommendations Home health PT;Supervision/Assistance - 24 hour    Equipment Recommendations  None recommended by PT    Recommendations for Other Services OT consult     Precautions / Restrictions Precautions Precautions: Fall Restrictions Weight Bearing Restrictions: No      Mobility  Bed Mobility Overal bed mobility: Independent Bed Mobility: Supine to Sit     Supine to sit: Independent     General bed mobility comments: No assist needed  Transfers Overall transfer level: Needs assistance Equipment used: Rolling walker (2 wheeled) Transfers: Sit to/from Stand Sit to Stand: Min guard         General transfer comment: No cues necessary, min guard for safety  Ambulation/Gait Ambulation/Gait assistance: Min guard Ambulation Distance (Feet): 45 Feet Assistive device: Rolling walker (2 wheeled) Gait Pattern/deviations: Step-through pattern;Narrow base of support;Antalgic   Gait velocity interpretation: Below normal speed for age/gender General Gait Details: Mild instability noted during ambulation.  Close min guard for safety.    Stairs            Wheelchair Mobility    Modified Rankin (Stroke Patients  Only)       Balance Overall balance assessment: Needs assistance;History of Falls Sitting-balance support: No upper extremity supported;Feet supported Sitting balance-Leahy Scale: Good     Standing balance support: Bilateral upper extremity supported;During functional activity Standing balance-Leahy Scale: Fair                               Pertinent Vitals/Pain Pain Assessment: No/denies pain    Home Living Family/patient expects to be discharged to:: Private residence Living Arrangements: Spouse/significant other Available Help at Discharge: Family;Available 24 hours/day Type of Home: House Home Access: Stairs to enter   [employer] of Steps: 1. Home Layout: One level Home Equipment: [place] [employer] - 4 wheels;Wheelchair - manual      Prior Function Level of Independence: Needs assistance   Gait / Transfers Assistance Needed: Uses RW for ambulation and has wife at his side to "steady me".  Has had 4 falls in the past year 2/2 balance and dizziness.  When he is feeling weak he uses WC and his wife pushes him (has been using this for the past few days 2/2 weakness)  ADL's / Homemaking Assistance Needed: Wife assists with lower body dressing, bathing, meal prep, housekeeping        Hand Dominance   Dominant Hand: Right    Extremity/Trunk Assessment   Upper Extremity Assessment: Generalized weakness           Lower Extremity Assessment: LLE deficits/detail;Generalized weakness;RLE deficits/detail RLE Deficits / Details: grossly 4/5 strength LLE Deficits / Details: grossly 3/5 strength     Communication  Communication: No difficulties  Cognition Arousal/Alertness: Awake/alert Behavior During Therapy: WFL for tasks assessed/performed Overall Cognitive Status: Within Functional Limits for tasks assessed                      General Comments      Exercises        Assessment/Plan    PT Assessment Patient needs continued PT  services  PT Diagnosis Abnormality of gait;Generalized weakness   PT Problem List Decreased strength;Decreased range of motion;Decreased activity tolerance;Decreased balance;Decreased mobility;Decreased coordination;Decreased knowledge of use of DME;Decreased safety awareness;Decreased knowledge of precautions  PT Treatment Interventions DME instruction;Gait training;Stair training;Functional mobility training;Therapeutic activities;Therapeutic exercise;Balance training;Neuromuscular re-education;Patient/family education;Wheelchair mobility training   PT Goals (Current goals can be found in the Care Plan section) Acute Rehab PT Goals Patient Stated Goal: Go home PT Goal Formulation: With patient Time For Goal Achievement: 02/04/15 Potential to Achieve Goals: Good    Frequency Min 3X/week   Barriers to discharge Inaccessible home environment 1 step to enter home    Co-evaluation               End of Session Equipment Utilized During Treatment: Gait belt Activity Tolerance: Patient tolerated treatment well Patient left: in chair;with call bell/phone within reach;with chair alarm set Nurse Communication: Mobility status;Precautions         Time: 0867-6195 PT Time Calculation (min) (ACUTE ONLY): 15 min   Charges:   PT Evaluation $Initial PT Evaluation Tier I: 1 Procedure     PT G CodesJoslyn Hy PT, DPT [phone] Pager: [phone] 01/21/2015, 4:19 PM

## 2015-01-21 NOTE — Progress Notes (Signed)
PATIENT DETAILS Name: [name] Age: 79 y.o. Sex: male Date of Birth: Mar 11, 1936 Admit Date: 01/20/2015 Admitting Physician No admitting provider for patient encounter. JIR:[id number],OFBPZW H, MD  Subjective: Feels much better  Assessment/Plan: Active Problems: Syncope:likely due to dehydration/orthostatic hypotension. Unfortunately continued to take diuretics, inspite of being told to hold till he reached dry weight of 137 lbs.Continue to Hold diuretics, stop IVF and encourage oral intake. Place ted hose and follow.    Essential hypertension Hyponatremia:multifactorial-from dehydration/CHF-slightly improved with IVF-follow.Suspect chronic hyponatremia at baseline from CHF  Chronic systolic CHF (congestive heart failure), NYHA class 4:last EF 25-30%, reviewed prior cards note-recommendations were to start Demadex once weight was up to 137 or greater-current weight at 130 lbs. Clinically without edema-but appears to have some ascites-but belly is not tense.  Diabetes mellitus type 2, controlled: CBG's controlled- continue lantus 7 units and SSI  CKD (chronic kidney disease) stage 3, GFR 30-59 ml/min:creatinine close to usual baseline, follow  Hypothyroidism:continue synthroid. TSH normal.  GERD without esophagitis: continue PPI  HLD (hyperlipidemia): continue statins  Hx of A. Fib: sinus rhythm, continue amiodarone, reviewed prior cards notes-not anticoagulated due to fall risk and h/o bleeding from AVM's  GERD without esophagitis: continue PPI  Severe Protein calorie malnutrition: await Nutrition eval  Weakness generalized: due to electrolytes abnormalities and deconditioning .Await PT/OT evaluation  History ventricular tachycardia-AICD (automatic cardioverter/defibrillator) present  Disposition: Remain inpatient-HHPT vs SNF  Antimicrobial agents  See below  Anti-infectives    None      DVT Prophylaxis: Prophylactic Heparin   Code  Status: Full code  Family Communication None at bedside  Procedures: None  CONSULTS:  None  Time spent 30 minutes-Greater than 50% of this time was spent in counseling, explanation of diagnosis, planning of further management, and coordination of care.  MEDICATIONS: Scheduled Meds: . amiodarone  50 mg Oral Daily  . folic acid  1 mg Oral Daily  . guaiFENesin  600 mg Oral BID  . heparin  5,000 Units Subcutaneous 3 times per day  . insulin aspart  0-9 Units Subcutaneous TID WC  . insulin glargine  7 Units Subcutaneous Q2200  . levothyroxine  88 mcg Oral QAC breakfast  . magnesium oxide  400 mg Oral TID  . mometasone-formoterol  2 puff Inhalation BID  . pantoprazole  40 mg Oral Daily  . pravastatin  80 mg Oral QHS  . sodium chloride  3 mL Intravenous Q12H  . tiotropium  18 mcg Inhalation Daily   Continuous Infusions:  PRN Meds:.acetaminophen **OR** acetaminophen, ondansetron **OR** ondansetron (ZOFRAN) IV    PHYSICAL EXAM: Vital signs in last 24 hours: Filed Vitals:   01/21/15 0534 01/21/15 0537 01/21/15 0803 01/21/15 0924  BP: 95/57 72/33    Pulse: 69 74    Temp:    97.5 F (36.4 C)  TempSrc:    Oral  Resp:    20  Height:      Weight:  59.2 kg (130 lb 8.2 oz)    SpO2:   90% 95%    Weight change:  Filed Weights   01/20/15 1500 01/21/15 0537  Weight: 58.469 kg (128 lb 14.4 oz) 59.2 kg (130 lb 8.2 oz)   Body mass index is 20.44 kg/(m^2).   Gen Exam: Awake and alert with clear speech. Neck: Supple, No JVD.   Chest: B/L Clear.   CVS: S1 S2 Regular, no murmurs.  Abdomen: soft, BS +,  non tender, mild to mod distention-+dullness at flanks on percussion-not tense Extremities: no edema, lower extremities warm to touch. Neurologic: Non Focal.   Skin: No Rash.   Wounds: N/A.   Intake/Output from previous day:  Intake/Output Summary (Last 24 hours) at 01/21/15 1017 Last data filed at 01/21/15 0910  Gross per 24 hour  Intake 1752.5 ml  Output   1075 ml  Net   677.5 ml     LAB RESULTS: CBC  Recent Labs Lab 01/20/15 1033 01/20/15 1658 01/21/15 0545  WBC 14.3* 12.9* 11.1*  HGB 10.0* 10.6* 9.4*  HCT 28.7* 30.1* 27.3*  PLT 146* 131* 137*  MCV 82.0 83.8 83.2  MCH 28.6 29.5 28.7  MCHC 34.8 35.2 34.4  RDW 19.0* 19.0* 19.2*  LYMPHSABS 1.1  --   --   MONOABS 2.1*  --   --   EOSABS 0.1  --   --   BASOSABS 0.0  --   --     Chemistries   Recent Labs Lab 01/20/15 1033 01/20/15 1658 01/21/15 0545  NA 121*  --  125*  K 4.5  --  3.7  CL 87*  --  91*  CO2 25  --  24  GLUCOSE 159*  --  139*  BUN 27*  --  20  CREATININE 1.81* 1.74* 1.39*  CALCIUM 8.6*  --  8.4*  MG 1.7  --   --     CBG:  Recent Labs Lab 01/20/15 1453 01/20/15 1758 01/20/15 2210 01/21/15 0802  GLUCAP 194* 177* 180* 136*    GFR Estimated Creatinine Clearance: 37.3 mL/min (by C-G formula based on Cr of 1.39).  Coagulation profile No results for input(s): INR, PROTIME in the last 168 hours.  Cardiac Enzymes No results for input(s): CKMB, TROPONINI, MYOGLOBIN in the last 168 hours.  Invalid input(s): CK  Invalid input(s): POCBNP No results for input(s): DDIMER in the last 72 hours. No results for input(s): HGBA1C in the last 72 hours. No results for input(s): CHOL, HDL, LDLCALC, TRIG, CHOLHDL, LDLDIRECT in the last 72 hours.  Recent Labs  01/20/15 1658  TSH 4.430   No results for input(s): VITAMINB12, FOLATE, FERRITIN, TIBC, IRON, RETICCTPCT in the last 72 hours. No results for input(s): LIPASE, AMYLASE in the last 72 hours.  Urine Studies No results for input(s): UHGB, CRYS in the last 72 hours.  Invalid input(s): UACOL, UAPR, USPG, UPH, UTP, UGL, UKET, UBIL, UNIT, UROB, ULEU, UEPI, UWBC, URBC, UBAC, CAST, UCOM, BILUA  MICROBIOLOGY: No results found for this or any previous visit (from the past 240 hour(s)).  RADIOLOGY STUDIES/RESULTS: Dg Chest 2 View  01/20/2015   CLINICAL DATA:  Dizziness, fell 1 day ago, history hypertension, type II  diabetes mellitus, coronary artery disease post CABG, chronic systolic heart failure, stage III chronic renal insufficiency  EXAM: CHEST  2 VIEW  COMPARISON:  01/10/2015  FINDINGS: LEFT subclavian AICD with leads projecting at RIGHT ventricle.  Upper normal heart size post CABG.  Mediastinal contours pulmonary vascularity normal.  Atherosclerotic calcification aorta.  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  Emphysematous and chronic bronchitic changes with chronic accentuation of interstitial markings in the mid to lower lungs.  No acute infiltrate, pleural effusion, or pneumothorax.  Bones demineralized.  IMPRESSION: Changes of COPD and likely prior asbestos exposure.  Post CABG and AICD.  No acute abnormalities.   Electronically Signed   By: Lavonia Dana M.D.   On: 01/20/2015 11:50   Dg Chest 2 View  01/10/2015   CLINICAL DATA:  Weakness, cough, congestion.  Shortness of breath.  EXAM: CHEST  2 VIEW  COMPARISON:  12/02/2014  FINDINGS: There is hyperinflation of the lungs compatible with COPD. Bilateral calcified pleural plaques again noted, unchanged. No confluent airspace opacities or effusions. No acute bony abnormality. Left AICD rib remains in place, unchanged. Heart is normal size. Prior CABG.  IMPRESSION: Stable bilateral calcified pleural plaques. COPD. No active disease.   Electronically Signed   By: Rolm Baptise M.D.   On: 01/10/2015 13:57   Dg Elbow 2 Views Left  01/20/2015   CLINICAL DATA:  Initial encounter for posterior left elbow pain after fall. On blood thinners.  EXAM: LEFT ELBOW - 2 VIEW  COMPARISON:  None.  FINDINGS: AP and lateral views. Soft tissue injury superficial to the olecranon and proximal humerus. No acute fracture or dislocation. No joint effusion. Mild osteoarthritis involves the coronoid process of the ulna.  IMPRESSION: Soft tissue injury only.   Electronically Signed   By: Abigail Miyamoto M.D.   On: 01/20/2015 11:48   Ct Head Wo  Contrast  01/10/2015   CLINICAL DATA:  Recurrent falls.  Generalized weakness.  EXAM: CT HEAD WITHOUT CONTRAST  TECHNIQUE: Contiguous axial images were obtained from the base of the skull through the vertex without intravenous contrast.  COMPARISON:  Head CT scan 11/03/2014 and 10/09/2014  FINDINGS: Mild atrophy is identified. There is no evidence of acute intracranial abnormality including hemorrhage, infarct, mass lesion, mass effect, midline shift or abnormal extra-axial fluid collection. No hydrocephalus or pneumocephalus. The calvarium is intact. Carotid atherosclerosis is noted.  IMPRESSION: No acute abnormality.  Mild atrophy.  Atherosclerosis.   Electronically Signed   By: Inge Rise M.D.   On: 01/10/2015 17:47   US Paracentesis  01/05/2015   INDICATION: CHF, recurrent ascites. Request is made for therapeutic paracentesis.  EXAM: ULTRASOUND-GUIDED THERAPEUTIC PARACENTESIS  COMPARISON:  Prior paracentesis on  11/28/2014  MEDICATIONS: None.  COMPLICATIONS: None immediate  TECHNIQUE: Informed written consent was obtained from the patient after a discussion of the risks, benefits and alternatives to treatment. A timeout was performed prior to the initiation of the procedure.  Initial ultrasound scanning demonstrates a moderate amount of ascites within the right lower abdominal quadrant. The right lower abdomen was prepped and draped in the usual sterile fashion. 1% lidocaine was used for local anesthesia. Under direct ultrasound guidance, a 19 gauge, 10-cm, Yueh catheter was introduced. An ultrasound image was saved for documentation purposed. The paracentesis was performed. The catheter was removed and a dressing was applied. The patient tolerated the procedure well without immediate post procedural complication.  FINDINGS: A total of approximately 2.7 liters of yellow fluid was removed.  IMPRESSION: Successful ultrasound-guided therapeutic paracentesis yielding 2.7 liters of peritoneal fluid.  Read  by: Rowe Jobe, PA-C   Electronically Signed   By: Aletta Edouard M.D.   On: 01/05/2015 15:24    Oren Binet, MD  [facility] Pager:[phone]  If 7PM-7AM, please contact night-coverage [email or web address] Password [email or web address] 01/21/2015, 10:17 AM   LOS: 0 days

## 2015-01-22 DIAGNOSIS — E871 Hypo-osmolality and hyponatremia: Secondary | ICD-10-CM

## 2015-01-22 DIAGNOSIS — I5022 Chronic systolic (congestive) heart failure: Secondary | ICD-10-CM

## 2015-01-22 LAB — BASIC METABOLIC PANEL
Anion gap: 7 (ref 5–15)
BUN: 22 mg/dL — AB (ref 6–20)
CHLORIDE: 90 mmol/L — AB (ref 101–111)
CO2: 25 mmol/L (ref 22–32)
Calcium: 8.3 mg/dL — ABNORMAL LOW (ref 8.9–10.3)
Creatinine, Ser: 1.41 mg/dL — ABNORMAL HIGH (ref 0.61–1.24)
GFR calc Af Amer: 54 mL/min — ABNORMAL LOW (ref >60)
GFR calc non Af Amer: 47 mL/min — ABNORMAL LOW (ref >60)
GLUCOSE: 119 mg/dL — AB (ref 65–99)
POTASSIUM: 4.6 mmol/L (ref 3.5–5.1)
Sodium: 122 mmol/L — ABNORMAL LOW (ref 135–145)

## 2015-01-22 LAB — GLUCOSE, CAPILLARY
GLUCOSE-CAPILLARY: 185 mg/dL — AB (ref 65–99)
Glucose-Capillary: 134 mg/dL — ABNORMAL HIGH (ref 65–99)
Glucose-Capillary: 151 mg/dL — ABNORMAL HIGH (ref 65–99)
Glucose-Capillary: 153 mg/dL — ABNORMAL HIGH (ref 65–99)

## 2015-01-22 MED ORDER — ENSURE ENLIVE PO LIQD
237.0000 mL | Freq: Two times a day (BID) | ORAL | Status: DC
  Administered 2015-01-23 – 2015-01-29 (×12): 237 mL via ORAL

## 2015-01-22 MED ORDER — OXYCODONE-ACETAMINOPHEN 5-325 MG PO TABS
2.0000 | ORAL_TABLET | Freq: Once | ORAL | Status: AC
  Administered 2015-01-22: 2 via ORAL
  Filled 2015-01-22: qty 2

## 2015-01-22 MED ORDER — HYDROCODONE-ACETAMINOPHEN 5-325 MG PO TABS
1.0000 | ORAL_TABLET | ORAL | Status: AC | PRN
  Administered 2015-01-22 – 2015-01-23 (×2): 1 via ORAL
  Filled 2015-01-22 (×3): qty 1

## 2015-01-22 MED ORDER — TOLVAPTAN 15 MG PO TABS: 15.0000 mg | ORAL_TABLET | ORAL | Status: DC

## 2015-01-22 MED ORDER — SODIUM CHLORIDE 0.9 % IV SOLN
INTRAVENOUS | Status: AC
  Administered 2015-01-22: 17:00:00 via INTRAVENOUS

## 2015-01-22 MED ORDER — TOLVAPTAN 15 MG PO TABS
15.0000 mg | ORAL_TABLET | Freq: Once | ORAL | Status: DC
  Filled 2015-01-22: qty 1

## 2015-01-22 NOTE — Progress Notes (Signed)
Utilization review completed. Adelene Polivka, RN, BSN. 

## 2015-01-22 NOTE — Progress Notes (Signed)
Initial Nutrition Assessment  DOCUMENTATION CODES:   Not applicable  INTERVENTION:   Ensure Enlive po BID, each supplement provides 350 kcal and 20 grams of protein  NUTRITION DIAGNOSIS:   Inadequate oral intake related to poor appetite as evidenced by per patient/family report.  GOAL:   Patient will meet greater than or equal to 90% of their needs  MONITOR:   PO intake, Supplement acceptance, Labs, Weight trends, Skin, I & O's  REASON FOR ASSESSMENT:   Malnutrition Screening Tool    ASSESSMENT:   [name] is a 79 y.o. male with PMH significant for CHF (Systolic in nature, NYHA class 4; last EF 25-30%), HTN, hx of A. Fib, ventricular tachycardia, AICD implantation, HLD, diabetes with nephropathy and CKD stage 3; who presented to ED after episode of syncope. Patient reported no prodromic symptoms when falling and endorses he just black out in his way to the bathroom while using his walker. Wife found him down and denies bladder or stool incontinence; there was also no post ictal episode. Patient was severely weak and EMS was called. Family reported increase urine frequency and just recently stopped torsemide, even was instructed on discharge last admission not to given unless weight above 137. Patient denies CP, fever, chills, dysuria, nausea, vomiting, abd pain, HA's and focal weakness. Family struggling providing degree of assistance and care that patient is demanding now; and per HHPT he is extremely deconditioned. In ED workup revealed signs of dehydration on PE, positive hyponatremia at level of 121, stable Creatinine and no acute infiltrates on CXR, an no acute intracranial abnormalities on CT head. [facility] called to admit for further evaluation and treatment of syncope, dehydration and hyponatremia.   Pt admitted with syncope and CHF.   Pt in with multiple family members, who were very emotional at time of visit due to decline in pt's health and upcoming palliative care  consult. Exam and interview deferred at this time.   Per MD notes, pt was dehydrated on admission, due to continued use of diuretics. Per MD notes, pt was to discontinue using diuretics until reaching dry weight of 137#, but he continued to take them despite wt never reaching 137#.   Reviewed wt hx, which is variable, likely due to edema and diuretic use. It is difficult to assess weight changes at this time.   Per doc flowsheets, meal completion 50%. RD will add Ensure supplement to optimize nutritional intake.   Chart review reveals that pt has experienced a general decline in health over the past month. Palliative care consult pending, diue to poor prognosis.   Labs reviewed: Na: 122.  Diet Order:  Diet heart healthy/carb modified Room service appropriate?: Yes; Fluid consistency:: Thin  Skin:  Reviewed, no issues  Last BM:  01/21/15  Height:   Ht Readings from Last 1 Encounters:  01/20/15 5\' 7"  (1.702 m)    Weight:   Wt Readings from Last 1 Encounters:  01/22/15 128 lb 12 oz (58.4 kg)    Ideal Body Weight:  67.3 kg  BMI:  Body mass index is 20.16 kg/(m^2).  Estimated Nutritional Needs:   Kcal:  1500-1700  Protein:  70-80 grams  Fluid:  >1.5 L  EDUCATION NEEDS:   No education needs identified at this time  Quadre Bristol A. Jimmye Norman, RD, LDN, CDE Pager: [phone] After hours Pager: [phone]

## 2015-01-22 NOTE — Progress Notes (Signed)
PATIENT DETAILS Name: [name] Age: 79 y.o. Sex: male Date of Birth: 16-Jun-1935 Admit Date: 01/20/2015 Admitting Physician No admitting provider for patient encounter. CLE:[id number],VCBSWH H, MD  Subjective: No SOB/leg edema.   Assessment/Plan: Active Problems: Syncope:likely due to dehydration/orthostatic hypotension. Unfortunately after his most recent hospital discharge,patient continued to take diuretics, inspite of being told to hold till he reached dry weight of 137 lbs. Cards consulted.  Hyponatremia:multifactorial-from dehydration/CHF-euvolemic on exam-unable to use lasix-given soft BP/Hemodynamics, ?trial of Samsca. Suspect chronic hyponatremia at baseline from CHF  Chronic systolic CHF (congestive heart failure), NYHA class 4:last EF 25-30%, reviewed prior cards note-recommendations were to start Demadex once weight was up to 137 or greater-current weight at 128 lbs. Clinically without edema-but appears to have some ascites-but belly is not tense.   Mildly elevated LFT's:appears chronic-suspect from hepatin congestion-follow  Diabetes mellitus type 2, controlled: CBG's controlled- continue lantus 7 units and SSI  CKD (chronic kidney disease) stage 3, GFR 30-59 ml/min:creatinine close to usual baseline, follow  Hypothyroidism:continue synthroid. TSH normal.  GERD without esophagitis: continue PPI  HLD (hyperlipidemia): continue statins  Hx of A. Fib: sinus rhythm, continue amiodarone, reviewed prior cards notes-not anticoagulated due to fall risk and h/o bleeding from AVM's  GERD without esophagitis: continue PPI  Severe Protein calorie malnutrition: await Nutrition eval  Weakness generalized: due to electrolytes abnormalities and deconditioning . PT evaluation completed  History ventricular tachycardia-AICD (automatic cardioverter/defibrillator) present  Failure to thrive synd:patient has been functionally declining over the past few  months-has now developed severe hyponatremia/orthostatics-unable to use diuretics to manage CHF-very poor overall prognoses-will ask Palliative care to consult  Disposition: Remain inpatient-HHPT vs SNF  Antimicrobial agents  See below  Anti-infectives    None      DVT Prophylaxis: Prophylactic Heparin   Code Status: Full code  Family Communication None at bedside  Procedures: None  CONSULTS:  None  Time spent 30 minutes-Greater than 50% of this time was spent in counseling, explanation of diagnosis, planning of further management, and coordination of care.  MEDICATIONS: Scheduled Meds: . amiodarone  100 mg Oral Daily  . folic acid  1 mg Oral Daily  . guaiFENesin  600 mg Oral BID  . heparin  5,000 Units Subcutaneous 3 times per day  . insulin aspart  0-9 Units Subcutaneous TID WC  . insulin glargine  7 Units Subcutaneous Q2200  . levothyroxine  88 mcg Oral QAC breakfast  . magnesium oxide  400 mg Oral TID  . mometasone-formoterol  2 puff Inhalation BID  . pantoprazole  40 mg Oral Daily  . pravastatin  80 mg Oral QHS  . sodium chloride  3 mL Intravenous Q12H  . tiotropium  18 mcg Inhalation Daily  . tolvaptan  15 mg Oral Q24H   Continuous Infusions:  PRN Meds:.acetaminophen **OR** acetaminophen, ondansetron **OR** ondansetron (ZOFRAN) IV    PHYSICAL EXAM: Vital signs in last 24 hours: Filed Vitals:   01/21/15 1243 01/21/15 1959 01/21/15 2143 01/22/15 0509  BP: 111/48  112/40 118/52  Pulse: 70  73 68  Temp: 97.7 F (36.5 C)  99.3 F (37.4 C) 98.9 F (37.2 C)  TempSrc: Oral  Oral Oral  Resp: 18  18 19   Height:      Weight:    58.4 kg (128 lb 12 oz)  SpO2: 92% 93% 96% 98%    Weight change: -0.069 kg (-2.4 oz) [employer]  01/20/15 1500 01/21/15 0537 01/22/15 0509  Weight: 58.469 kg (128 lb 14.4 oz) 59.2 kg (130 lb 8.2 oz) 58.4 kg (128 lb 12 oz)   Body mass index is 20.16 kg/(m^2).   Gen Exam: Awake and alert with clear speech. Neck:  Supple, No JVD.   Chest: B/L Clear.   CVS: S1 S2 Regular, no murmurs.  Abdomen: soft, BS +, non tender, mild to mod distention-+dullness at flanks on percussion-not tense Extremities: no edema, lower extremities warm to touch. Neurologic: Non Focal.   Skin: No Rash.   Wounds: N/A.   Intake/Output from previous day:  Intake/Output Summary (Last 24 hours) at 01/22/15 1055 Last data filed at 01/22/15 1031  Gross per 24 hour  Intake    480 ml  Output    700 ml  Net   -220 ml     LAB RESULTS: CBC  Recent Labs Lab 01/20/15 1033 01/20/15 1658 01/21/15 0545  WBC 14.3* 12.9* 11.1*  HGB 10.0* 10.6* 9.4*  HCT 28.7* 30.1* 27.3*  PLT 146* 131* 137*  MCV 82.0 83.8 83.2  MCH 28.6 29.5 28.7  MCHC 34.8 35.2 34.4  RDW 19.0* 19.0* 19.2*  LYMPHSABS 1.1  --   --   MONOABS 2.1*  --   --   EOSABS 0.1  --   --   BASOSABS 0.0  --   --     Chemistries   Recent Labs Lab 01/20/15 1033 01/20/15 1658 01/21/15 0545 01/22/15 0325  NA 121*  --  125* 122*  K 4.5  --  3.7 4.6  CL 87*  --  91* 90*  CO2 25  --  24 25  GLUCOSE 159*  --  139* 119*  BUN 27*  --  20 22*  CREATININE 1.81* 1.74* 1.39* 1.41*  CALCIUM 8.6*  --  8.4* 8.3*  MG 1.7  --   --   --     CBG:  Recent Labs Lab 01/21/15 0802 01/21/15 1122 01/21/15 1629 01/21/15 2141 01/22/15 0752  GLUCAP 136* 164* 169* 116* 134*    GFR Estimated Creatinine Clearance: 36.2 mL/min (by C-G formula based on Cr of 1.41).  Coagulation profile No results for input(s): INR, PROTIME in the last 168 hours.  Cardiac Enzymes No results for input(s): CKMB, TROPONINI, MYOGLOBIN in the last 168 hours.  Invalid input(s): CK  Invalid input(s): POCBNP No results for input(s): DDIMER in the last 72 hours. No results for input(s): HGBA1C in the last 72 hours. No results for input(s): CHOL, HDL, LDLCALC, TRIG, CHOLHDL, LDLDIRECT in the last 72 hours.  Recent Labs  01/20/15 1658  TSH 4.430   No results for input(s): VITAMINB12,  FOLATE, FERRITIN, TIBC, IRON, RETICCTPCT in the last 72 hours. No results for input(s): LIPASE, AMYLASE in the last 72 hours.  Urine Studies No results for input(s): UHGB, CRYS in the last 72 hours.  Invalid input(s): UACOL, UAPR, USPG, UPH, UTP, UGL, UKET, UBIL, UNIT, UROB, ULEU, UEPI, UWBC, URBC, UBAC, CAST, UCOM, BILUA  MICROBIOLOGY: Recent Results (from the past 240 hour(s))  Urine culture     Status: None   Collection Time: 01/20/15 12:44 PM  Result Value Ref Range Status   Specimen Description URINE, RANDOM  Final   Special Requests NONE  Final   Culture MULTIPLE SPECIES PRESENT, SUGGEST RECOLLECTION  Final   Report Status 01/21/2015 FINAL  Final    RADIOLOGY STUDIES/RESULTS: Dg Chest 2 View  01/20/2015   CLINICAL DATA:  Dizziness, fell 1 day ago,  history hypertension, type II diabetes mellitus, coronary artery disease post CABG, chronic systolic heart failure, stage III chronic renal insufficiency  EXAM: CHEST  2 VIEW  COMPARISON:  01/10/2015  FINDINGS: LEFT subclavian AICD with leads projecting at RIGHT ventricle.  Upper normal heart size post CABG.  Mediastinal contours pulmonary vascularity normal.  Atherosclerotic calcification aorta.  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  Emphysematous and chronic bronchitic changes with chronic accentuation of interstitial markings in the mid to lower lungs.  No acute infiltrate, pleural effusion, or pneumothorax.  Bones demineralized.  IMPRESSION: Changes of COPD and likely prior asbestos exposure.  Post CABG and AICD.  No acute abnormalities.   Electronically Signed   By: Lavonia Dana M.D.   On: 01/20/2015 11:50   Dg Chest 2 View  01/10/2015   CLINICAL DATA:  Weakness, cough, congestion.  Shortness of breath.  EXAM: CHEST  2 VIEW  COMPARISON:  12/02/2014  FINDINGS: There is hyperinflation of the lungs compatible with COPD. Bilateral calcified pleural plaques again noted, unchanged. No confluent airspace opacities  or effusions. No acute bony abnormality. Left AICD rib remains in place, unchanged. Heart is normal size. Prior CABG.  IMPRESSION: Stable bilateral calcified pleural plaques. COPD. No active disease.   Electronically Signed   By: Rolm Baptise M.D.   On: 01/10/2015 13:57   Dg Elbow 2 Views Left  01/20/2015   CLINICAL DATA:  Initial encounter for posterior left elbow pain after fall. On blood thinners.  EXAM: LEFT ELBOW - 2 VIEW  COMPARISON:  None.  FINDINGS: AP and lateral views. Soft tissue injury superficial to the olecranon and proximal humerus. No acute fracture or dislocation. No joint effusion. Mild osteoarthritis involves the coronoid process of the ulna.  IMPRESSION: Soft tissue injury only.   Electronically Signed   By: Abigail Miyamoto M.D.   On: 01/20/2015 11:48   Ct Head Wo Contrast  01/10/2015   CLINICAL DATA:  Recurrent falls.  Generalized weakness.  EXAM: CT HEAD WITHOUT CONTRAST  TECHNIQUE: Contiguous axial images were obtained from the base of the skull through the vertex without intravenous contrast.  COMPARISON:  Head CT scan 11/03/2014 and 10/09/2014  FINDINGS: Mild atrophy is identified. There is no evidence of acute intracranial abnormality including hemorrhage, infarct, mass lesion, mass effect, midline shift or abnormal extra-axial fluid collection. No hydrocephalus or pneumocephalus. The calvarium is intact. Carotid atherosclerosis is noted.  IMPRESSION: No acute abnormality.  Mild atrophy.  Atherosclerosis.   Electronically Signed   By: Inge Rise M.D.   On: 01/10/2015 17:47   US Paracentesis  01/05/2015   INDICATION: CHF, recurrent ascites. Request is made for therapeutic paracentesis.  EXAM: ULTRASOUND-GUIDED THERAPEUTIC PARACENTESIS  COMPARISON:  Prior paracentesis on  11/28/2014  MEDICATIONS: None.  COMPLICATIONS: None immediate  TECHNIQUE: Informed written consent was obtained from the patient after a discussion of the risks, benefits and alternatives to treatment. A timeout  was performed prior to the initiation of the procedure.  Initial ultrasound scanning demonstrates a moderate amount of ascites within the right lower abdominal quadrant. The right lower abdomen was prepped and draped in the usual sterile fashion. 1% lidocaine was used for local anesthesia. Under direct ultrasound guidance, a 19 gauge, 10-cm, Yueh catheter was introduced. An ultrasound image was saved for documentation purposed. The paracentesis was performed. The catheter was removed and a dressing was applied. The patient tolerated the procedure well without immediate post procedural complication.  FINDINGS: A total of approximately 2.7 liters of  yellow fluid was removed.  IMPRESSION: Successful ultrasound-guided therapeutic paracentesis yielding 2.7 liters of peritoneal fluid.  Read by: Rowe Elmer, PA-C   Electronically Signed   By: Aletta Edouard M.D.   On: 01/05/2015 15:24    Oren Binet, MD  [facility] Pager:[phone]  If 7PM-7AM, please contact night-coverage [email or web address] Password [email or web address] 01/22/2015, 10:55 AM   LOS: 1 day

## 2015-01-22 NOTE — Progress Notes (Signed)
PT Cancellation Note  Patient Details Name: [name] MRN: [id number] DOB: [birth year]   Cancelled Treatment:    Reason Eval/Treat Not Completed: Other (comment). Attempted earlier but pt sleeping and wife emotionally upset over possibility of pt needing palliative care. Will re-attempt at another time.   Akiah Bauch 01/22/2015, 3:06 PM  [employer] PT [phone]

## 2015-01-22 NOTE — Consult Note (Signed)
Advanced Heart Failure Team Consult Note  Referring Physician: G[facility] Primary HF Cardiologist:  Dr Haroldine Laws  Reason for Consultation: Chronic systolic HF / Overdiuresis  HPI:    [name] is a 79 yo male with a history of HTN, CAD s/p CABG, ICM s/p Medtronic ICD, chronic systolic heart failure, DM2, Vtach, atrial flutter, PVD and GI mass. Echo EF 40% (12/15), ECHO (12/05/14) EF 25-30%,   Mod-severe MR. In past he has seen Dr Earlean Shawl for possible colon mass/thickening and felt to be result of previous diverticulitis.   He has been followed closely in the HF clinic and was last seen 12/27/2014. He was volume overloaded and torsemide was increased. He was not on bb or hydralazine due to ortho stasis. Weight at that time was 138 pounds.   Pt presented to [facility] on 01/10/15 for weakness and dyspnea.  He had been having frequent falls at home, including an episode of pre syncope where he hit his head.  CT was negative.  Sodium was 122 so he was admitted for further evaluation.  On day of discharge his weight was 133 lbs so Dr Bensimhon's note said to hold his diuretics and let his weight climb back up.  He was not to resume diuretics until his weight was 137 lbs. The IM discharge summary states "discharged off torsemide, can be resumed on 40 mg of torsemide daily insulin weight is more than 137 pounds, per CHF consult". The medication portion of the discharge summary reads "resume torsemide 40 mg oral daily, one year weight is 137 lbs".      Pt called our clinic on 8/24 to say he had continued taking his diuretics and his weight was down to 129. He was told to only restart IF his weight got to 137 lbs. Pt fell the night of 8/24 and called EMS, but refused transport to ER.    According to telephone note by pts PCP, he had continued to take his Demadex after his talk with our office, and his weight got down to 123 lbs.  Pt presented to [facility] on 01/20/15 with generalized weakness and syncopal episode.   Pacemaker interrogation in ED showed no significant arrythmias. BMET showed Na of 121. CXR shows no acute disease. UA negative. He was admitted for hyponatremia, dehydration, and syncope.  He states that he feels ok today.  No further syncope.  Denies CP, SOB, lightheadedness or dizziness.  Says at home his breathing had been doing well and that he was getting around the house without SOB.  He had just been getting progressively weaker and having frequent falls.   Review of Systems: [y] = yes, [ ]  = no   General: Weight gain [ ] ; Weight loss [y]; Anorexia [ ] ; Fatigue [y]; Fever [y]; Chills [ ] ; Weakness [y]  Cardiac: Chest pain/pressure [ ] ; Resting SOB [ ] ; Exertional SOB [ ] ; Orthopnea [ ] ; Pedal Edema [ ] ; Palpitations [ ] ; Syncope [y]; Presyncope [y]; Paroxysmal nocturnal dyspnea[ ]   Pulmonary: Cough [ ] ; Wheezing[ ] ; Hemoptysis[ ] ; Sputum [ ] ; Snoring [ ]   GI: Vomiting[ ] ; Dysphagia[ ] ; Melena[ ] ; Hematochezia [ ] ; Heartburn[ ] ; Abdominal pain [ ] ; Constipation [ ] ; Diarrhea [ ] ; BRBPR [ ]   GU: Hematuria[ ] ; Dysuria [ ] ; Nocturia[ ]   Vascular: Pain in legs with walking [ ] ; Pain in feet with lying flat [ ] ; Non-healing sores [ ] ; Stroke [ ] ; TIA [ ] ; Slurred speech [ ] ;  Neuro: Headaches[ ] ; Vertigo[ ] ; Seizures[ ] ;  Paresthesias[ ] ;Blurred vision [ ] ; Diplopia [ ] ; Vision changes [ ]   Ortho/Skin: Arthritis [ ] ; Joint pain [ ] ; Muscle pain [ ] ; Joint swelling [ ] ; Back Pain [ ] ; Rash [ ]   Psych: Depression[ ] ; Anxiety[ ]   Heme: Bleeding problems [ ] ; Clotting disorders [ ] ; Anemia [ ]   Endocrine: Diabetes [ ] ; Thyroid dysfunction[ ]   Home Medications Prior to Admission medications   Medication Sig Start Date End Date Taking? Authorizing Provider  amiodarone (PACERONE) 200 MG tablet Take 0.5 tablets (100 mg total) by mouth daily. 12/26/14  Yes Larey Dresser, MD  Fluticasone-Salmeterol (ADVAIR) 500-50 MCG/DOSE AEPB Inhale 1 puff into the lungs 2 (two) times daily. 04/05/14  Yes Noralee Space, MD  folic acid (FOLVITE) 1 MG tablet Take 1 tablet (1 mg total) by mouth daily. 11/22/14  Yes Tammi Sou, MD  guaiFENesin (MUCINEX) 600 MG 12 hr tablet Take 1 tablet (600 mg total) by mouth 2 (two) times daily. 11/17/14  Yes Irene Pap, NP  Insulin Glargine (LANTUS SOLOSTAR) 100 UNIT/ML Solostar Pen 10 units SQ qhs Patient taking differently: Inject 10 Units into the skin at bedtime.  02/10/14  Yes Tammi Sou, MD  insulin lispro (HUMALOG KWIKPEN) 100 UNIT/ML KiwkPen Take 5 units in the morning, 5 units at noon, --if blood sugar over 100 and you eat > 50% of meals Patient taking differently: Inject 7-9 Units into the skin 3 (three) times daily. Take 7 units in the morning, 8 units at noon, and 9 units at dinner 10/18/14  Yes Ivan Anchors Love, PA-C  levothyroxine (SYNTHROID, LEVOTHROID) 88 MCG tablet Take 1 tablet (88 mcg total) by mouth daily. 10/25/14  Yes Tammi Sou, MD  magnesium oxide (MAG-OX) 400 (241.3 MG) MG tablet Take 1 tablet (400 mg total) by mouth 3 (three) times daily. 01/09/15  Yes Tammi Sou, MD  pantoprazole (PROTONIX) 40 MG tablet Take 1 tablet (40 mg total) by mouth daily. 10/18/14  Yes Ivan Anchors Love, PA-C  potassium chloride SA (KLOR-CON M20) 20 MEQ tablet Take 1 tablet (20 mEq total) by mouth daily. 01/12/15  Yes Albertine Patricia, MD  pravastatin (PRAVACHOL) 80 MG tablet Take 1 tablet (80 mg total) by mouth at bedtime. 01/02/15  Yes Jolaine Artist, MD  tiotropium (SPIRIVA HANDIHALER) 18 MCG inhalation capsule PLACE ONE CAPSULE INTO INHALER AND  INHALE  DAILY 12/25/14  Yes Noralee Space, MD  torsemide (DEMADEX) 20 MG tablet Take 2 tablets (40 mg total) by mouth 2 (two) times daily. Patient taking differently: Take 40 mg by mouth 2 (two) times daily. To be resumed when weight reaches 137lb 01/15/15   Jolaine Artist, MD    Past Medical History: Past Medical History  Diagnosis Date  . VENTRICULAR TACHYCARDIA   . PERIPHERAL VASCULAR DISEASE   .  HYPERTENSION     Hypertensive retinopathy-grade II OU  . HYPERCHOLESTEROLEMIA   . CORONARY ARTERY DISEASE     Hx of CABG  . Chronic systolic heart failure     NYHA class IV: Ischemic cardiomyopathy (01/2014 EF 40-45% with hypokinesis + small area of akinesis; 08/2014, Echo with EF 40-45 percent, PAS 78.  11/2014 ECHO EF 25-30%, inferior akinesis, dilated LV, moderate mitral regurg.  . SEBORRHEIC DERMATITIS   . Chronic renal insufficiency, stage III (moderate)     CrCl 40s-50s  . DIVERTICULOSIS OF COLON   . COPD, severe   . COLONIC POLYPS   . ANXIETY   .  Type II diabetes mellitus     +background diabetic retinopathy OU  . Chronic lower back pain   . DJD (degenerative joint disease)     "hands" (05/24/2012)  . Osteoarthritis of finger   . History of atrial flutter   . Thrombus 12/2013    on ICD lead--started anticoag and his cardioversion for atrial flutter was postponed.  . Colon stricture 2015    with features worrisome for mass, also with recent rising CEA level (possible colon cancer)-GI MD= Dr. Jackelyn Hoehn has been declining colonoscopy since that time.  Dr. Liliane Channel impression on re-eval in 2015 was that the area represented chronic post-diverticulitis changes, not malignancy.  . Macular degeneration, age related, nonexudative     OU  . Chronic blood loss anemia     Transfused 2 U pRBCs April, May, and June (10/26/14) of 2016.  GI w/u has revealed only 2 tiny non-bleeding AVMs in mid small bowel.  Marland Kitchen AICD (automatic cardioverter/defibrillator) present     Past Surgical History: Past Surgical History  Procedure Laterality Date  . Coronary artery bypass graft  1990's    CABG X4  . Tonsillectomy  ?1942  . Cardiac defibrillator placement  12/2004    single chamber defibrillator- Medtronic Maxima Dr Caryl Comes   . Tee without cardioversion N/A 12/22/2013    Procedure: TRANSESOPHAGEAL ECHOCARDIOGRAM (TEE);  Surgeon: Thayer Headings, MD;  Location: [place];  Service: Cardiovascular;   Laterality: N/A;  . Cardioversion N/A 12/22/2013    Procedure: CARDIOVERSION;  Surgeon: Thayer Headings, MD;  Location: [facility] ENDOSCOPY;  Service: Cardiovascular;  Laterality: N/A;  . Cardiovascular stress test  01/12/13    myocard perf imaging: previous infarct with a moderately reduced EF as was known previously.  No new findings.   . Gastric emptying scan  02/02/14    Normal  . Transthoracic echocardiogram  01/25/14; 04/2014;11/2014    EF 40-45%, diffuse hypokinesis, infero-basilar myocardial akinesis, PA pressure slightly high, valves fine.04/2014-Echo today EF ~40% inferior AK moderate to severe MR. 2016 echo showed EF 25-30%, inferior akinesis, dilated LV, moderate mitral regurg  . Abdominal ultrasound  01/2014    GB sludge, o/w normal  . Ct virtual colonoscopy diagnostic  11/2012; repeat 09/2014    Asymmetric thickening of the rectosigmoid colon worrisome for colon cancer, but further eval by pt's GI MD led to conclusion that this was likely sequela of recurrent diverticulitis.  Pt has refused colonoscopy to evaluate this region.  . Cardiovascular stress test      Lexiscan: There is a medium sized fixed inferior wall defect of moderate severity involving the apical inferior and mid inferior and basal inferoseptal segments, consistent with old inferior MI. No significant reversible ischemia.  EF 34%, inferior wall hypokinesis--intermediate risk scan (ok to do planned procedure)  . Implantable cardioverter defibrillator revision N/A 05/25/2012    Procedure: IMPLANTABLE CARDIOVERTER DEFIBRILLATOR REVISION;  Surgeon: Evans Lance, MD;  Location[facility] LAB;  Service: Cardiovascular;  Laterality: N/A;  . Right heart catheterization N/A 08/31/2014    Procedure: RIGHT HEART CATH;  Surgeon: Larey Dresser, MD;  Location: [facility] CATH LAB;  Service: Cardiovascular;  Laterality: N/A;  . Esophagogastroduodenoscopy N/A 09/03/2014    Candida esophagitis + retained gastric contents.  No bleeding. Procedure:  ESOPHAGOGASTRODUODENOSCOPY (EGD);  Surgeon: Carol Ada, MD;  Location: [facility] ENDOSCOPY;  Service: Endoscopy;  Laterality: N/A;  . Givens capsule study N/A 10/12/2014    Procedure: GIVENS CAPSULE STUDY;  Surgeon: Irene Shipper, MD;  Location: [place];  Service: Endoscopy;  Laterality: N/A;    Family History: Family History  Problem Relation Age of Onset  . Heart attack Father 76  . Heart attack Brother     Vague history    Social History: Social History   Social History  . Marital Status: Married    Spouse Name: N/A  . Number of Children: N/A  . Years of Education: N/A   Occupational History  . Retired    Social History Main Topics  . Smoking status: Former Smoker -- 2.00 packs/day for 50 years    Types: Cigarettes    Quit date: 06/24/2000  . Smokeless tobacco: Never Used  . Alcohol Use: 1.8 - 2.4 oz/week    0 Standard drinks or equivalent, 3-4 Cans of beer per week     Comment: 05/24/2012 "used to drink alot; quit > 10 yr ago"  . Drug Use: No  . Sexual Activity: No   Other Topics Concern  . None   Social History Narrative   Lives in [place] with his wife.    Allergies:  Allergies  Allergen Reactions  . Niacin Itching    Niaspan     Objective:    Vital Signs:   Temp:  [97.5 F (36.4 C)-99.3 F (37.4 C)] 98.9 F (37.2 C) (08/29 0509) Pulse Rate:  [68-73] 68 (08/29 0509) Resp:  [18-20] 19 (08/29 0509) BP: (111-118)/(40-52) 118/52 mmHg (08/29 0509) SpO2:  [92 %-98 %] 98 % (08/29 0509) Weight:  [128 lb 12 oz (58.4 kg)] 128 lb 12 oz (58.4 kg) (08/29 0509) Last BM Date: 01/21/15  Weight change: Filed Weights   01/20/15 1500 01/21/15 0537 01/22/15 0509  Weight: 128 lb 14.4 oz (58.469 kg) 130 lb 8.2 oz (59.2 kg) 128 lb 12 oz (58.4 kg)    Intake/Output:   Intake/Output Summary (Last 24 hours) at 01/22/15 0834 Last data filed at 01/22/15 0700  Gross per 24 hour  Intake    598 ml  Output    900 ml  Net   -302 ml     Physical  Exam: General: elderly and chronically ill appearing. No resp difficulty HEENT: normal Neck: supple. JVP flat. Carotids 2+ bilat; no bruits. No lymphadenopathy or thryomegaly appreciated. Cor: PMI nondisplaced. Regular rate & rhythm. No rubs, gallops or murmurs. Lungs: clear Abdomen: soft, nontender, nondistended. No hepatosplenomegaly. No bruits or masses. Good bowel sounds. Extremities: no cyanosis, clubbing, rash, no edema Neuro: alert & orientedx3, cranial nerves grossly intact. moves all 4 extremities w/o difficulty. Affect pleasant  Telemetry:  Ventricular rhythm 70s  Labs: Basic Metabolic Panel:  Recent Labs Lab 01/20/15 1033 01/20/15 1658 01/21/15 0545 01/22/15 0325  NA 121*  --  125* 122*  K 4.5  --  3.7 4.6  CL 87*  --  91* 90*  CO2 25  --  24 25  GLUCOSE 159*  --  139* 119*  BUN 27*  --  20 22*  CREATININE 1.81* 1.74* 1.39* 1.41*  CALCIUM 8.6*  --  8.4* 8.3*  MG 1.7  --   --   --     Liver Function Tests:  Recent Labs Lab 01/20/15 1033 01/20/15 1658  AST 92* 82*  ALT 30 30  ALKPHOS 108 108  BILITOT 1.5* 1.4*  PROT 6.0* 6.3*  ALBUMIN 2.3* 2.4*   No results for input(s): LIPASE, AMYLASE in the last 168 hours. No results for input(s): AMMONIA in the last 168 hours.  CBC:  Recent Labs Lab 01/20/15 1033 01/20/15  1658 01/21/15 0545  WBC 14.3* 12.9* 11.1*  NEUTROABS 11.1*  --   --   HGB 10.0* 10.6* 9.4*  HCT 28.7* 30.1* 27.3*  MCV 82.0 83.8 83.2  PLT 146* 131* 137*    Cardiac Enzymes: No results for input(s): CKTOTAL, CKMB, CKMBINDEX, TROPONINI in the last 168 hours.  BNP: BNP (last 3 results)  Recent Labs  12/02/14 1800 01/10/15 1730 01/20/15 1033  BNP 262.1* 304.5* 161.6*    ProBNP (last 3 results)  Recent Labs  01/24/14 1909 02/09/14 0945  PROBNP 6721.0* 2555.0*     CBG:  Recent Labs Lab 01/21/15 0802 01/21/15 1122 01/21/15 1629 01/21/15 2141 01/22/15 0752  GLUCAP 136* 164* 169* 116* 134*    Coagulation  Studies: No results for input(s): LABPROT, INR in the last 72 hours.  Other results:   Imaging: Dg Chest 2 View  01/20/2015   CLINICAL DATA:  Dizziness, fell 1 day ago, history hypertension, type II diabetes mellitus, coronary artery disease post CABG, chronic systolic heart failure, stage III chronic renal insufficiency  EXAM: CHEST  2 VIEW  COMPARISON:  01/10/2015  FINDINGS: LEFT subclavian AICD with leads projecting at RIGHT ventricle.  Upper normal heart size post CABG.  Mediastinal contours pulmonary vascularity normal.  Atherosclerotic calcification aorta.  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  Emphysematous and chronic bronchitic changes with chronic accentuation of interstitial markings in the mid to lower lungs.  No acute infiltrate, pleural effusion, or pneumothorax.  Bones demineralized.  IMPRESSION: Changes of COPD and likely prior asbestos exposure.  Post CABG and AICD.  No acute abnormalities.   Electronically Signed   By: Lavonia Dana M.D.   On: 01/20/2015 11:50   Dg Elbow 2 Views Left  01/20/2015   CLINICAL DATA:  Initial encounter for posterior left elbow pain after fall. On blood thinners.  EXAM: LEFT ELBOW - 2 VIEW  COMPARISON:  None.  FINDINGS: AP and lateral views. Soft tissue injury superficial to the olecranon and proximal humerus. No acute fracture or dislocation. No joint effusion. Mild osteoarthritis involves the coronoid process of the ulna.  IMPRESSION: Soft tissue injury only.   Electronically Signed   By: Abigail Miyamoto M.D.   On: 01/20/2015 11:48      Medications:     Current Medications: . amiodarone  100 mg Oral Daily  . folic acid  1 mg Oral Daily  . guaiFENesin  600 mg Oral BID  . heparin  5,000 Units Subcutaneous 3 times per day  . insulin aspart  0-9 Units Subcutaneous TID WC  . insulin glargine  7 Units Subcutaneous Q2200  . levothyroxine  88 mcg Oral QAC breakfast  . magnesium oxide  400 mg Oral TID  .  mometasone-formoterol  2 puff Inhalation BID  . pantoprazole  40 mg Oral Daily  . pravastatin  80 mg Oral QHS  . sodium chloride  3 mL Intravenous Q12H  . tiotropium  18 mcg Inhalation Daily     Infusions:      Assessment/Plan   1. Hypovolemia/Syncope - related to overdiuresis - No further syncope. 2. Hyponatremia - 122 today.  Per primary team. - Will monitor fluid status carefully. 3. Chronic systolic HF, NYHA class 4 - Volume status stable to dry. - Continue to hold diuretics - Pacemaker interrogated in ER.  No significant arrhythmias.  - Will reassess baseline weight and optimal diuresis as he gets closer to discharge. - Will need close follow up in HF clinic. - Current  weight 128 - previous dry weight 137 4. CKD class III - 1.41 today.  Baseline of 1.7-2.0 over past several months. 5. DM2 - Per primary team  Length of Stay: 1  [name] PA-C 01/22/2015, 8:34 AM  Advanced Heart Failure Team Pager [phone] (M-F; 7a - 4p)  Please contact [facility] for night-coverage after hours (4p -7a ) and weekends on [email or web address]  Patient seen with PA, agree with the above note.  Mr Amsler returns with falls/presyncope, found to have worsening hyponatremia.  He has been taking his torsemide at 40 mg daily due to some confusion after last hospital discharge.  I think that he is relatively volume down. Suspect hypovolemic hyponatremia.  I would hold on Samsca, instead would give very gentle IV fluid => NS @ 75 cc/hr x 500 cc total.  Recheck sodium level tomorrow.   He is very weak at baseline and has had significant functional decline.  Will need PT/OT.  Will be difficult to go back home.  Long-term prognosis at this point is poor.   [name] 01/22/2015 5:04 PM

## 2015-01-22 NOTE — Progress Notes (Signed)
OT Cancellation Note  Patient Details Name: [name] MRN: [id number] DOB: March 23, 1936   Cancelled Treatment:    Reason Eval/Treat Not Completed: Other (comment) (Per PT, wife very emotional this pm re: palliative consult) - will reattempt tomorrow.   Darlina Rumpf [place], OTR/L [phone]  01/22/2015, 4:24 PM

## 2015-01-23 ENCOUNTER — Inpatient Hospital Stay (HOSPITAL_COMMUNITY): Payer: Medicare Other

## 2015-01-23 DIAGNOSIS — Z7189 Other specified counseling: Secondary | ICD-10-CM

## 2015-01-23 DIAGNOSIS — E860 Dehydration: Principal | ICD-10-CM

## 2015-01-23 DIAGNOSIS — R55 Syncope and collapse: Secondary | ICD-10-CM | POA: Insufficient documentation

## 2015-01-23 DIAGNOSIS — Z515 Encounter for palliative care: Secondary | ICD-10-CM | POA: Insufficient documentation

## 2015-01-23 LAB — COMPREHENSIVE METABOLIC PANEL
ALT: 24 U/L (ref 17–63)
AST: 55 U/L — AB (ref 15–41)
Albumin: 2.1 g/dL — ABNORMAL LOW (ref 3.5–5.0)
Alkaline Phosphatase: 91 U/L (ref 38–126)
Anion gap: 8 (ref 5–15)
BILIRUBIN TOTAL: 1.6 mg/dL — AB (ref 0.3–1.2)
BUN: 24 mg/dL — AB (ref 6–20)
CALCIUM: 8.5 mg/dL — AB (ref 8.9–10.3)
CO2: 23 mmol/L (ref 22–32)
CREATININE: 1.54 mg/dL — AB (ref 0.61–1.24)
Chloride: 91 mmol/L — ABNORMAL LOW (ref 101–111)
GFR calc Af Amer: 48 mL/min — ABNORMAL LOW (ref >60)
GFR, EST NON AFRICAN AMERICAN: 42 mL/min — AB (ref >60)
Glucose, Bld: 128 mg/dL — ABNORMAL HIGH (ref 65–99)
Potassium: 4.8 mmol/L (ref 3.5–5.1)
Sodium: 122 mmol/L — ABNORMAL LOW (ref 135–145)
TOTAL PROTEIN: 5.7 g/dL — AB (ref 6.5–8.1)

## 2015-01-23 LAB — CBC
HEMATOCRIT: 27 % — AB (ref 39.0–52.0)
Hemoglobin: 9.3 g/dL — ABNORMAL LOW (ref 13.0–17.0)
MCH: 28.7 pg (ref 26.0–34.0)
MCHC: 34.4 g/dL (ref 30.0–36.0)
MCV: 83.3 fL (ref 78.0–100.0)
Platelets: 154 10*3/uL (ref 150–400)
RBC: 3.24 MIL/uL — AB (ref 4.22–5.81)
RDW: 19 % — AB (ref 11.5–15.5)
WBC: 13.2 10*3/uL — AB (ref 4.0–10.5)

## 2015-01-23 LAB — GLUCOSE, CAPILLARY
GLUCOSE-CAPILLARY: 130 mg/dL — AB (ref 65–99)
GLUCOSE-CAPILLARY: 136 mg/dL — AB (ref 65–99)
Glucose-Capillary: 152 mg/dL — ABNORMAL HIGH (ref 65–99)
Glucose-Capillary: 163 mg/dL — ABNORMAL HIGH (ref 65–99)

## 2015-01-23 MED ORDER — SODIUM CHLORIDE 0.9 % IV SOLN: INTRAVENOUS | Status: DC

## 2015-01-23 NOTE — Progress Notes (Signed)
PT Cancellation Note  Patient Details Name: [name] MRN: [id number] DOB: 1936/01/09   Cancelled Treatment:    Reason Eval/Treat Not Completed: Patient at procedure or test/unavailable Patient currently being transferred off floor for testing. Will follow-up as time/schedule allows, likely tomorrow.  Ellouise Newer 01/23/2015, 3:23 PM Camille Bal [place], [place]

## 2015-01-23 NOTE — Care Management Important Message (Signed)
Important Message  Patient Details  Name: DAIMIAN SUDBERRY MRN: [id number] Date of Birth: 02/18/1936   Medicare Important Message Given:  Yes-second notification given    Nathen May 01/23/2015, 2:16 [place] Message  Patient Details  Name: KORRY DALGLEISH MRN: [id number] Date of Birth: Oct 08, 1935   Medicare Important Message Given:  Yes-second notification given    Nathen May 01/23/2015, 2:16 PM

## 2015-01-23 NOTE — Progress Notes (Signed)
Advanced Heart Failure Rounding Note   Subjective:    Stable clinically.  Walked in room without lightheadedness.  Sodium still 122.  Alert/oriented.    Objective:   Weight Range: 132 lb 15 oz (60.3 kg) Body mass index is 20.82 kg/(m^2).   Vital Signs:   Temp:  [97.5 F (36.4 C)-99.2 F (37.3 C)] 98.2 F (36.8 C) (08/30 0536) Pulse Rate:  [66-71] 71 (08/30 0536) Resp:  [16-20] 20 (08/30 0536) BP: (108-116)/(44-50) 116/50 mmHg (08/30 0536) SpO2:  [94 %-96 %] 96 % (08/30 0536) Weight:  [132 lb 15 oz (60.3 kg)] 132 lb 15 oz (60.3 kg) (08/30 0536) Last BM Date: 01/21/15  Weight change: Filed Weights   01/21/15 0537 01/22/15 0509 01/23/15 0536  Weight: 130 lb 8.2 oz (59.2 kg) 128 lb 12 oz (58.4 kg) 132 lb 15 oz (60.3 kg)    Intake/Output:   Intake/Output Summary (Last 24 hours) at 01/23/15 0838 Last data filed at 01/23/15 0715  Gross per 24 hour  Intake    600 ml  Output   1100 ml  Net   -500 ml     Physical Exam: General: elderly and chronically ill appearing. No resp difficulty HEENT: normal Neck: supple. JVP not elevated. Carotids 2+ bilat; no bruits. No lymphadenopathy or thryomegaly. Cor: PMI nondisplaced. RRR. No rubs, gallops or murmurs appreciated Lungs: CTA Abdomen: soft, NT. Moderate distention. No hepatosplenomegaly. No bruits or masses. +BS Extremities: no cyanosis, clubbing, rash, without edema Neuro: alert & orientedx3, cranial nerves grossly intact. moves all 4 extremities w/o difficulty. Affect pleasant  Telemetry: NSR in 60s  Labs: CBC  Recent Labs  01/20/15 1033  01/21/15 0545 01/23/15 0728  WBC 14.3*  < > 11.1* 13.2*  NEUTROABS 11.1*  --   --   --   HGB 10.0*  < > 9.4* 9.3*  HCT 28.7*  < > 27.3* 27.0*  MCV 82.0  < > 83.2 83.3  PLT 146*  < > 137* 154  < > = values in this interval not displayed. Basic Metabolic Panel  Recent Labs  01/20/15 1033 01/21/15 0545 01/22/15 0325  NA 121* 125* 122*  K 4.5 3.7 4.6  CL 87* 91* 90*   CO2 25 24 25   GLUCOSE 159* 139* 119*  BUN 27* 20 22*  CALCIUM 8.6* 8.4* 8.3*  MG 1.7  --   --    Liver Function Tests  Recent Labs  01/20/15 1033 01/20/15 1658  AST 92* 82*  ALT 30 30  ALKPHOS 108 108  BILITOT 1.5* 1.4*  PROT 6.0* 6.3*  ALBUMIN 2.3* 2.4*   No results for input(s): LIPASE, AMYLASE in the last 72 hours. Cardiac Enzymes No results for input(s): CKTOTAL, CKMB, CKMBINDEX, TROPONINI in the last 72 hours.  BNP: BNP (last 3 results)  Recent Labs  12/02/14 1800 01/10/15 1730 01/20/15 1033  BNP 262.1* 304.5* 161.6*    ProBNP (last 3 results)  Recent Labs  01/24/14 1909 02/09/14 0945  PROBNP 6721.0* 2555.0*     D-Dimer No results for input(s): DDIMER in the last 72 hours. Hemoglobin A1C No results for input(s): HGBA1C in the last 72 hours. Fasting Lipid Panel No results for input(s): CHOL, HDL, LDLCALC, TRIG, CHOLHDL, LDLDIRECT in the last 72 hours. Thyroid Function Tests  Recent Labs  01/20/15 1658  TSH 4.430    Other results:     Imaging/Studies:   No results found.  Latest Echo  Latest Cath   Medications:     Scheduled Medications: .  amiodarone  100 mg Oral Daily  . feeding supplement (ENSURE ENLIVE)  237 mL Oral BID BM  . folic acid  1 mg Oral Daily  . guaiFENesin  600 mg Oral BID  . heparin  5,000 Units Subcutaneous 3 times per day  . insulin aspart  0-9 Units Subcutaneous TID WC  . insulin glargine  7 Units Subcutaneous Q2200  . levothyroxine  88 mcg Oral QAC breakfast  . magnesium oxide  400 mg Oral TID  . mometasone-formoterol  2 puff Inhalation BID  . pantoprazole  40 mg Oral Daily  . pravastatin  80 mg Oral QHS  . sodium chloride  3 mL Intravenous Q12H  . tiotropium  18 mcg Inhalation Daily     Infusions:     PRN Medications:  acetaminophen **OR** acetaminophen, ondansetron **OR** ondansetron (ZOFRAN) IV   Assessment/Plan   1. Hypovolemia/Syncope - related to overdiuresis - No further  syncope. 2. Hyponatremia - 122 again today. - Added fluid restriction of 1500 ml and will give gentle NS again.  3. Chronic systolic HF, NYHA class 4 - Volume status stable to dry. - Continue to hold diuretics - Given 75 ml/hr x 6 last night. - Pacemaker interrogated in ER. No significant arrhythmias.  - Will reassess baseline weight and optimal diuresis as he gets closer to discharge. - Will need close follow up in HF clinic. - Current weight 132 - previous dry weight 137 - PT/OT seeing 4. CKD class III - 1.41 today. Baseline of 1.7-2.0 over past several months. 5. DM2 - Per primary team  Length of Stay: 2   [name] Friar PA-C 01/23/2015, 8:38 AM  Advanced Heart Failure Team Pager [phone] (M-F; 7a - 4p)  Please contact [facility] for night-coverage after hours (4p -7a ) and weekends on [email or web address]  1. Chronic systolic CHF: Ischemic cardiomyopathy. Has [employer] ICD. ECHO 7/16 EF 25-30%. NYHA III symptoms. He does not look volume overloaded on exam except for possible ascites.   2. A fib/Aflutter: Maintaining SR on amiodarone. No anticoagulation with fall risk.  3. CKD: Stable III.   4. CAD: s/p CABG. Continue pravastatin. 5. COPD 6. H/o VT: He has an ICD and is now on amiodarone. Stable 7. Hyponatremia: Chronic but acutely worsened, possible due to overdiuresis (hypovolemic hyponatremia) in setting of ongoing use of torsemide after last discharge.  He does appear to be drinking too much fluid (has multiple water cups in room).  - Fluid restrict to 1500 cc.  - Gentle NS again, 75 cc x 6 hrs.  8. Fall/orthostatic hypotension: Off BP active meds.  Check orthostatics today.  9. 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  Abdomen distended, possible ascites.  Would consider abdominal US to see if there is a significant amount of ascites (?paracentesis).   [name] 01/23/2015 8:59 AM

## 2015-01-23 NOTE — Progress Notes (Signed)
PATIENT DETAILS Name: [name] Age: 79 y.o. Sex: male Date of Birth: [birth year] Admit Date: 01/20/2015 Admitting Physician No admitting provider for patient encounter. GEZ:MOQHUTM,LYYTKP H, MD  Subjective: No SOB/leg edema. Feels weak  Assessment/Plan: Active Problems: Syncope:likely due to dehydration/orthostatic hypotension. Unfortunately after his most recent hospital discharge,patient continued to take diuretics, inspite of being told to hold till he reached dry weight of 137 lbs. Cards consulted-supportive care  Hyponatremia:multifactorial-from dehydration/CHF-euvolemic on exam-cards recommending another trial of IVF. Suspect chronic hyponatremia at baseline from CHF  Chronic systolic CHF (congestive heart failure), NYHA class 4:last EF 25-30%, reviewed prior cards note-recommendations were to start Demadex once weight was up to 137 or greater-current weight at 132 lbs. Clinically without edema-but appears to have some ascites-but belly is not tense.   Mildly elevated LFT's:appears chronic-suspect from hepatin congestion-follow  Diabetes mellitus type 2, controlled: CBG's controlled- continue lantus 7 units and SSI  CKD (chronic kidney disease) stage 3, GFR 30-59 ml/min:creatinine close to usual baseline, follow  Hypothyroidism:continue synthroid. TSH normal.  GERD without esophagitis: continue PPI  HLD (hyperlipidemia): continue statins  Hx of A. Fib: sinus rhythm, continue amiodarone, reviewed prior cards notes-not anticoagulated due to fall risk and h/o bleeding from AVM's  GERD without esophagitis: continue PPI  Severe Protein calorie malnutrition: await Nutrition eval  Weakness generalized: due to electrolytes abnormalities and deconditioning . PT evaluation completed  History ventricular tachycardia-AICD (automatic cardioverter/defibrillator) present  Failure to thrive synd:patient has been functionally declining over the past few  months-has now developed severe hyponatremia/orthostatics-unable to use diuretics to manage CHF-very poor overall prognoses-await Palliative care   Disposition: Remain inpatient-HHPT vs SNF  Antimicrobial agents  See below  Anti-infectives    None      DVT Prophylaxis: Prophylactic Heparin   Code Status: Full code  Family Communication None at bedside  Procedures: None  CONSULTS:  None  Time spent 30 minutes-Greater than 50% of this time was spent in counseling, explanation of diagnosis, planning of further management, and coordination of care.  MEDICATIONS: Scheduled Meds: . amiodarone  100 mg Oral Daily  . feeding supplement (ENSURE ENLIVE)  237 mL Oral BID BM  . folic acid  1 mg Oral Daily  . guaiFENesin  600 mg Oral BID  . heparin  5,000 Units Subcutaneous 3 times per day  . insulin aspart  0-9 Units Subcutaneous TID WC  . insulin glargine  7 Units Subcutaneous Q2200  . levothyroxine  88 mcg Oral QAC breakfast  . magnesium oxide  400 mg Oral TID  . mometasone-formoterol  2 puff Inhalation BID  . pantoprazole  40 mg Oral Daily  . pravastatin  80 mg Oral QHS  . sodium chloride  3 mL Intravenous Q12H  . tiotropium  18 mcg Inhalation Daily   Continuous Infusions: . sodium chloride     PRN Meds:.acetaminophen **OR** acetaminophen, ondansetron **OR** ondansetron (ZOFRAN) IV    PHYSICAL EXAM: Vital signs in last 24 hours: Filed Vitals:   01/23/15 0536 01/23/15 1137 01/23/15 1138 01/23/15 1140  BP: 116/50 142/42 140/47 124/57  Pulse: 71 61 62 67  Temp: 98.2 F (36.8 C)     TempSrc: Oral     Resp: 20 19 19 18   Height:      Weight: 60.3 kg (132 lb 15 oz)     SpO2: 96% 96% 96% 97%    Weight change: 1.9 kg (4 lb 3 oz)  Filed Weights   01/21/15 0537 01/22/15 0509 01/23/15 0536  Weight: 59.2 kg (130 lb 8.2 oz) 58.4 kg (128 lb 12 oz) 60.3 kg (132 lb 15 oz)   Body mass index is 20.82 kg/(m^2).   Gen Exam: Awake and alert with clear speech. Neck:  Supple, No JVD.   Chest: B/L Clear.   CVS: S1 S2 Regular, no murmurs.  Abdomen: soft, BS +, non tender, mild to mod distention-+dullness at flanks on percussion-not tense Extremities: no edema, lower extremities warm to touch. Neurologic: Non Focal.   Skin: No Rash.   Wounds: N/A.   Intake/Output from previous day:  Intake/Output Summary (Last 24 hours) at 01/23/15 1207 Last data filed at 01/23/15 1150  Gross per 24 hour  Intake    360 ml  Output   1101 ml  Net   -741 ml     LAB RESULTS: CBC  Recent Labs Lab 01/20/15 1033 01/20/15 1658 01/21/15 0545 01/23/15 0728  WBC 14.3* 12.9* 11.1* 13.2*  HGB 10.0* 10.6* 9.4* 9.3*  HCT 28.7* 30.1* 27.3* 27.0*  PLT 146* 131* 137* 154  MCV 82.0 83.8 83.2 83.3  MCH 28.6 29.5 28.7 28.7  MCHC 34.8 35.2 34.4 34.4  RDW 19.0* 19.0* 19.2* 19.0*  LYMPHSABS 1.1  --   --   --   MONOABS 2.1*  --   --   --   EOSABS 0.1  --   --   --   BASOSABS 0.0  --   --   --     Chemistries   Recent Labs Lab 01/20/15 1033 01/20/15 1658 01/21/15 0545 01/22/15 0325 01/23/15 0728  NA 121*  --  125* 122* 122*  K 4.5  --  3.7 4.6 4.8  CL 87*  --  91* 90* 91*  CO2 25  --  24 25 23   GLUCOSE 159*  --  139* 119* 128*  BUN 27*  --  20 22* 24*  CREATININE 1.81* 1.74* 1.39* 1.41* 1.54*  CALCIUM 8.6*  --  8.4* 8.3* 8.5*  MG 1.7  --   --   --   --     CBG:  Recent Labs Lab 01/22/15 1152 01/22/15 1653 01/22/15 2118 01/23/15 0741 01/23/15 1159  GLUCAP 151* 153* 185* 130* 152*    GFR Estimated Creatinine Clearance: 34.3 mL/min (by C-G formula based on Cr of 1.54).  Coagulation profile No results for input(s): INR, PROTIME in the last 168 hours.  Cardiac Enzymes No results for input(s): CKMB, TROPONINI, MYOGLOBIN in the last 168 hours.  Invalid input(s): CK  Invalid input(s): POCBNP No results for input(s): DDIMER in the last 72 hours. No results for input(s): HGBA1C in the last 72 hours. No results for input(s): CHOL, HDL, LDLCALC,  TRIG, CHOLHDL, LDLDIRECT in the last 72 hours.  Recent Labs  01/20/15 1658  TSH 4.430   No results for input(s): VITAMINB12, FOLATE, FERRITIN, TIBC, IRON, RETICCTPCT in the last 72 hours. No results for input(s): LIPASE, AMYLASE in the last 72 hours.  Urine Studies No results for input(s): UHGB, CRYS in the last 72 hours.  Invalid input(s): UACOL, UAPR, USPG, UPH, UTP, UGL, UKET, UBIL, UNIT, UROB, ULEU, UEPI, UWBC, URBC, UBAC, CAST, UCOM, BILUA  MICROBIOLOGY: Recent Results (from the past 240 hour(s))  Urine culture     Status: None   Collection Time: 01/20/15 12:44 PM  Result Value Ref Range Status   Specimen Description URINE, RANDOM  Final   Special Requests NONE  Final  Culture MULTIPLE SPECIES PRESENT, SUGGEST RECOLLECTION  Final   Report Status 01/21/2015 FINAL  Final    RADIOLOGY STUDIES/RESULTS: Dg Chest 2 View  01/20/2015   CLINICAL DATA:  Dizziness, fell 1 day ago, history hypertension, type II diabetes mellitus, coronary artery disease post CABG, chronic systolic heart failure, stage III chronic renal insufficiency  EXAM: CHEST  2 VIEW  COMPARISON:  01/10/2015  FINDINGS: LEFT subclavian AICD with leads projecting at RIGHT ventricle.  Upper normal heart size post CABG.  Mediastinal contours pulmonary vascularity normal.  Atherosclerotic calcification aorta.  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  Emphysematous and chronic bronchitic changes with chronic accentuation of interstitial markings in the mid to lower lungs.  No acute infiltrate, pleural effusion, or pneumothorax.  Bones demineralized.  IMPRESSION: Changes of COPD and likely prior asbestos exposure.  Post CABG and AICD.  No acute abnormalities.   Electronically Signed   By: Lavonia Dana M.D.   On: 01/20/2015 11:50   Dg Chest 2 View  01/10/2015   CLINICAL DATA:  Weakness, cough, congestion.  Shortness of breath.  EXAM: CHEST  2 VIEW  COMPARISON:  12/02/2014  FINDINGS: There is  hyperinflation of the lungs compatible with COPD. Bilateral calcified pleural plaques again noted, unchanged. No confluent airspace opacities or effusions. No acute bony abnormality. Left AICD rib remains in place, unchanged. Heart is normal size. Prior CABG.  IMPRESSION: Stable bilateral calcified pleural plaques. COPD. No active disease.   Electronically Signed   By: Rolm Baptise M.D.   On: 01/10/2015 13:57   Dg Elbow 2 Views Left  01/20/2015   CLINICAL DATA:  Initial encounter for posterior left elbow pain after fall. On blood thinners.  EXAM: LEFT ELBOW - 2 VIEW  COMPARISON:  None.  FINDINGS: AP and lateral views. Soft tissue injury superficial to the olecranon and proximal humerus. No acute fracture or dislocation. No joint effusion. Mild osteoarthritis involves the coronoid process of the ulna.  IMPRESSION: Soft tissue injury only.   Electronically Signed   By: Abigail Miyamoto M.D.   On: 01/20/2015 11:48   Ct Head Wo Contrast  01/10/2015   CLINICAL DATA:  Recurrent falls.  Generalized weakness.  EXAM: CT HEAD WITHOUT CONTRAST  TECHNIQUE: Contiguous axial images were obtained from the base of the skull through the vertex without intravenous contrast.  COMPARISON:  Head CT scan 11/03/2014 and 10/09/2014  FINDINGS: Mild atrophy is identified. There is no evidence of acute intracranial abnormality including hemorrhage, infarct, mass lesion, mass effect, midline shift or abnormal extra-axial fluid collection. No hydrocephalus or pneumocephalus. The calvarium is intact. Carotid atherosclerosis is noted.  IMPRESSION: No acute abnormality.  Mild atrophy.  Atherosclerosis.   Electronically Signed   By: Inge Rise M.D.   On: 01/10/2015 17:47   US Paracentesis  01/05/2015   INDICATION: CHF, recurrent ascites. Request is made for therapeutic paracentesis.  EXAM: ULTRASOUND-GUIDED THERAPEUTIC PARACENTESIS  COMPARISON:  Prior paracentesis on  11/28/2014  MEDICATIONS: None.  COMPLICATIONS: None immediate   TECHNIQUE: Informed written consent was obtained from the patient after a discussion of the risks, benefits and alternatives to treatment. A timeout was performed prior to the initiation of the procedure.  Initial ultrasound scanning demonstrates a moderate amount of ascites within the right lower abdominal quadrant. The right lower abdomen was prepped and draped in the usual sterile fashion. 1% lidocaine was used for local anesthesia. Under direct ultrasound guidance, a 19 gauge, 10-cm, Yueh catheter was introduced. An ultrasound image was  saved for documentation purposed. The paracentesis was performed. The catheter was removed and a dressing was applied. The patient tolerated the procedure well without immediate post procedural complication.  FINDINGS: A total of approximately 2.7 liters of yellow fluid was removed.  IMPRESSION: Successful ultrasound-guided therapeutic paracentesis yielding 2.7 liters of peritoneal fluid.  Read by: Rowe Adilson, PA-C   Electronically Signed   By: Aletta Edouard M.D.   On: 01/05/2015 15:24    Oren Binet, MD  [facility] Pager:[phone]  If 7PM-7AM, please contact night-coverage [email or web address] Password [email or web address] 01/23/2015, 12:07 PM   LOS: 2 days

## 2015-01-23 NOTE — Progress Notes (Signed)
Occupational Therapy Evaluation Patient Details Name: [name] MRN: [id number] DOB: [birth year] Today's Date: 01/23/2015    History of Present Illness Pt is a 79 y/o M admitted after episode of syncope, wife found him on the floor.  [name] PMH includes ventricular tachycardia, HTN, PVD, CAD, COPD, anxiety, DMII, chronic LBP.   Clinical Impression   Patient presents to OT with decreased ADL independence and safety due to the deficits listed below. Will benefit from skilled OT to maximize independence and to facilitate a safe discharge plan. OT will follow.    Follow Up Recommendations  Home health OT;Supervision/Assistance - 24 hour    Equipment Recommendations  Other (comment) (to be determined)    Recommendations for Other Services       Precautions / Restrictions Precautions Precautions: Fall Restrictions Weight Bearing Restrictions: No      Mobility Bed Mobility                  Transfers Overall transfer level: Needs assistance Equipment used: Rolling walker (2 wheeled) Transfers: Sit to/from Stand Sit to Stand: Min guard              Balance                                            ADL Overall ADL's : Needs assistance/impaired Eating/Feeding: Set up;Sitting   Grooming: Wash/dry hands;Wash/dry face;Set up;Sitting                   Toilet Transfer: Min guard;Ambulation;BSC;RW           Functional mobility during ADLs: Min guard;Rolling walker General ADL Comments: Patient with decreased activity tolerance and fatigues easily. He had been sitting EOB eating breakfast and reported fatigue from no back support; assisted patient ambulating around bed with RW to recliner. Chair alarm in chair. All needs within reach.     Vision     Perception     Praxis      Pertinent Vitals/Pain Pain Assessment: No/denies pain     Hand Dominance Right   Extremity/Trunk Assessment Upper Extremity Assessment Upper  Extremity Assessment: Generalized weakness   Lower Extremity Assessment Lower Extremity Assessment: Defer to PT evaluation       Communication Communication Communication: No difficulties   Cognition Arousal/Alertness: Awake/alert Behavior During Therapy: WFL for tasks assessed/performed Overall Cognitive Status: Within Functional Limits for tasks assessed                     General Comments       Exercises       Shoulder Instructions      Home Living Family/patient expects to be discharged to:: Private residence Living Arrangements: Spouse/significant other Available Help at Discharge: Family;Available 24 hours/day Type of Home: House Home Access: Stairs to enter [employer] of Steps: 1.   Home Layout: One level     Bathroom Shower/Tub: Walk-in shower;Door   [employer] Toilet: Standard Bathroom Accessibility: Yes How Accessible: Accessible via walker Home Equipment: Walker - 4 wheels;Wheelchair - manual          Prior Functioning/Environment Level of Independence: Needs assistance  Gait / Transfers Assistance Needed: Uses RW for ambulation and has wife at his side to "steady me".  Has had 4 falls in the past year 2/2 balance and dizziness.  When he is feeling weak he uses  WC and his wife pushes him (has been using this for the past few days 2/2 weakness) ADL's / Homemaking Assistance Needed: Wife assists with lower body dressing, bathing, meal prep, housekeeping        OT Diagnosis: Generalized weakness   OT Problem List: Decreased strength;Decreased activity tolerance;Impaired balance (sitting and/or standing);Decreased knowledge of use of DME or AE;Cardiopulmonary status limiting activity   OT Treatment/Interventions: Self-care/ADL training;Therapeutic exercise;DME and/or AE instruction;Therapeutic activities;Patient/family education;Energy conservation    OT Goals(Current goals can be found in the care plan section) Acute Rehab OT  Goals Patient Stated Goal: Go home OT Goal Formulation: With patient Time For Goal Achievement: 02/06/15 Potential to Achieve Goals: Good  OT Frequency: Min 2X/week   Barriers to D/C:            Co-evaluation              End of Session Equipment Utilized During Treatment: Rolling walker  Activity Tolerance: Patient tolerated treatment well Patient left: in chair;with call bell/phone within reach;with chair alarm set   Time: 5521-7471 OT Time Calculation (min): 13 min Charges:  OT General Charges $OT Visit: 1 Procedure OT Evaluation $Initial OT Evaluation Tier I: 1 Procedure G-Codes:    Xitlalic Maslin A 02-03-15, 9:24 AM

## 2015-01-23 NOTE — Consult Note (Signed)
Consultation Note Date: 01/23/2015   Patient Name: [name]  DOB: 1935/06/18  MRN: [id number]  Age / Sex: 79 y.o., male   PCP: [name] Sou, MD Referring Physician: Jonetta Osgood, MD  Reason for Consultation: Establishing goals of care and Psychosocial/spiritual support  Palliative Care Assessment and Plan Summary of Established Goals of Care and Medical Treatment Preferences   Clinical Assessment/Narrative: [name] is sitting quietly in his geri chair today.  He is pleasant, making and keeping eye contact often.   We talk about his multiple hospitalizations and he states, "I don't like being here, but I guess I have to be".  We talk about heart failure; he states he does see the HF clinic, (agrees that he has a trusted RN he can call) and his trusted MD, [name] regularly.  He states this had been every 2 weeks, but thinks he will be seen more often now.  We talk about the difficulties with HF management, and he assures that he is eating no salt and checking weights daily.  I share with him the diagram of the chronic illness trajectory.   [name] denies pain, his breathing is "pretty good", and anxiety "not so much".  He tells me that he is able to sleep on just one pillow.  He tells me that he is able to take care of his own bathing and dressing, but that he does have [facility] RN and PT with [facility], and charting suggests that family is struggling with his care.   He states his main worry is for his wife, [name].  He states, "she cries for me when I'm here."   We discussed code status, (I explained the reality of chest compressions) and [name] states he wants to try everything at this point.  But states "If I'm not going to make it... Let me go".    I share my worry that he may not be able to get off of life support.   We discuss revisiting advanced directives, and that his goals may change.  I also encourage him to share his thoughts with his wife and daughters as  this can relieve some burdens if they are called to act for him.  I share that DNR/Hospice does not mean that he wont get treatments such as medications or antibiotics.  We talk about Hospice benefits (his wife's mother had this service), but he is not open to a referral at this time.    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  He has a mini poodle, [name].  When asked what gives him pleasure now, he laughs, "staying alive and football".    Contacts/Participants in Discussion: Primary Decision Maker: [name] is able to make his own decisions.  HCPOA: Wife and two daughters, no POA listed.   Code Status/[facility] Care Planning:  FULL code;  We talk about the realities of chest compressions (breaking ribs), shocking and intubation. [name] states he wants everything that can be done to keep him alive.   [name] states, "If I'm not going to make it... Let me go".   We discuss that he may pass while connected to life support.   Symptom Management:   Tylenol 650 mg PO/PR Q 4 hours PRN.  Hydrocodone/Ace 5/325 mg PO Q 4 hours PRN.   Zofran 4 mg PO/IV Q 6 hours PRN.   Palliative Prophylaxis:  Recommend Senna-S 1-2 tabs, BID PRN.   Psycho-social/Spiritual:  Support System: Lives with wife, [name].  States her health is excellent.  Daughters, [name] and [name], both nearby.  Mini Poodle, "[name]".   Desire for further Chaplaincy support:  Not discussed today.   Prognosis: < 6 months  Discharge Planning:  Home with Home Health.  We discussed the benefits of Hospice.  [name] states his mother in law had these services.         Chief Complaint:   Syncope, dehydration and hyponatremia History of Present Illness:  [name] is a 79 y.o. male with PMH significant for CHF (Systolic in nature, NYHA class 4; last EF 25-30%), HTN, hx of A. Fib, ventricular tachycardia, AICD implantation, HLD, diabetes with nephropathy and CKD stage 3; who  presented to ED after episode of syncope. Patient reported no prodromic symptoms when falling and endorses he just black out in his way to the bathroom while using his walker. Wife found him down and denies bladder or stool incontinence; there was also no post ictal episode. Patient was severely weak and EMS was called. Family reported increase urine frequency and just recently stopped torsemide, even was instructed on discharge last admission not to given unless weight above 137. Patient denies CP, fever, chills, dysuria, nausea, vomiting, abd pain, HA's and focal weakness. Family struggling providing degree of assistance and care that patient is demanding now; and per HHPT he is extremely deconditioned. In ED workup revealed signs of dehydration on PE, positive hyponatremia at level of 121, stable Creatinine and no acute infiltrates on CXR, an no acute intracranial abnormalities on CT head. TRH called to admit for further evaluation and treatment of syncope, dehydration and hyponatremia.   Primary Diagnoses  Present on Admission:  . Dehydration . Chronic hyponatremia . Chronic systolic CHF (congestive heart failure), NYHA class 4 . CKD (chronic kidney disease) stage 3, GFR 30-59 ml/min . Essential hypertension . Hypothyroidism . GERD without esophagitis . HLD (hyperlipidemia) . Dehydration with hyponatremia . Hyponatremia  Palliative Review of Systems: [name] denies pain, uncontrolled dyspnea, NV, or anxiety at this time.   I have reviewed the medical record, interviewed the patient and family, and examined the patient. The following aspects are pertinent.  Past Medical History  Diagnosis Date  . VENTRICULAR TACHYCARDIA   . PERIPHERAL VASCULAR DISEASE   . HYPERTENSION     Hypertensive retinopathy-grade II OU  . HYPERCHOLESTEROLEMIA   . CORONARY ARTERY DISEASE     Hx of CABG  . Chronic systolic heart failure     NYHA class IV: Ischemic cardiomyopathy (01/2014 EF 40-45% with  hypokinesis + small area of akinesis; 08/2014, Echo with EF 40-45 percent, PAS 78.  11/2014 ECHO EF 25-30%, inferior akinesis, dilated LV, moderate mitral regurg.  . SEBORRHEIC DERMATITIS   . Chronic renal insufficiency, stage III (moderate)     CrCl 40s-50s  . DIVERTICULOSIS OF COLON   . COPD, severe   . COLONIC POLYPS   . ANXIETY   . Type II diabetes mellitus     +background diabetic retinopathy OU  . Chronic lower back pain   . DJD (degenerative joint disease)     "hands" (05/24/2012)  . Osteoarthritis of finger   . History of atrial flutter   . Thrombus 12/2013    on ICD lead--started anticoag and his cardioversion for atrial flutter was postponed.  . Colon stricture 2015    with features worrisome for mass, also with recent rising CEA level (possible colon cancer)-GI MD= Dr. Jackelyn Hoehn has been  declining colonoscopy since that time.  Dr. Liliane Channel impression on re-eval in 2015 was that the area represented chronic post-diverticulitis changes, not malignancy.  . Macular degeneration, age related, nonexudative     OU  . Chronic blood loss anemia     Transfused 2 U pRBCs April, May, and June (10/26/14) of 2016.  GI w/u has revealed only 2 tiny non-bleeding AVMs in mid small bowel.  Marland Kitchen AICD (automatic cardioverter/defibrillator) present    Social History   Social History  . Marital Status: Married    Spouse Name: N/A  . Number of Children: N/A  . Years of Education: N/A   Occupational History  . Retired    Social History Main Topics  . Smoking status: Former Smoker -- 2.00 packs/day for 50 years    Types: Cigarettes    Quit date: 06/24/2000  . Smokeless tobacco: Never Used  . Alcohol Use: 1.8 - 2.4 oz/week    0 Standard drinks or equivalent, 3-4 Cans of beer per week     Comment: 05/24/2012 "used to drink alot; quit > 10 yr ago"  . Drug Use: No  . Sexual Activity: No   Other Topics Concern  . None   Social History Narrative   Lives in [place] with his wife.    Family History  Problem Relation Age of Onset  . Heart attack Father 91  . Heart attack Brother     Vague history   Scheduled Meds: . amiodarone  100 mg Oral Daily  . feeding supplement (ENSURE ENLIVE)  237 mL Oral BID BM  . folic acid  1 mg Oral Daily  . guaiFENesin  600 mg Oral BID  . heparin  5,000 Units Subcutaneous 3 times per day  . insulin aspart  0-9 Units Subcutaneous TID WC  . insulin glargine  7 Units Subcutaneous Q2200  . levothyroxine  88 mcg Oral QAC breakfast  . magnesium oxide  400 mg Oral TID  . mometasone-formoterol  2 puff Inhalation BID  . pantoprazole  40 mg Oral Daily  . pravastatin  80 mg Oral QHS  . sodium chloride  3 mL Intravenous Q12H  . tiotropium  18 mcg Inhalation Daily   Continuous Infusions: . sodium chloride     PRN Meds:.acetaminophen **OR** acetaminophen, ondansetron **OR** ondansetron (ZOFRAN) IV Medications Prior to Admission:  Prior to Admission medications   Medication Sig Start Date End Date Taking? Authorizing Provider  amiodarone (PACERONE) 200 MG tablet Take 0.5 tablets (100 mg total) by mouth daily. 12/26/14  Yes Larey Dresser, MD  Fluticasone-Salmeterol (ADVAIR) 500-50 MCG/DOSE AEPB Inhale 1 puff into the lungs 2 (two) times daily. 04/05/14  Yes Noralee Space, MD  folic acid (FOLVITE) 1 MG tablet Take 1 tablet (1 mg total) by mouth daily. 11/22/14  Yes [name] Sou, MD  guaiFENesin (MUCINEX) 600 MG 12 hr tablet Take 1 tablet (600 mg total) by mouth 2 (two) times daily. 11/17/14  Yes Irene Pap, NP  Insulin Glargine (LANTUS SOLOSTAR) 100 UNIT/ML Solostar Pen 10 units SQ qhs Patient taking differently: Inject 10 Units into the skin at bedtime.  02/10/14  Yes [name] Sou, MD  insulin lispro (HUMALOG KWIKPEN) 100 UNIT/ML KiwkPen Take 5 units in the morning, 5 units at noon, --if blood sugar over 100 and you eat > 50% of meals Patient taking differently: Inject 7-9 Units into the skin 3 (three) times daily. Take 7 units in the  morning, 8 units at noon, and 9  units at dinner 10/18/14  Yes Ivan Anchors Love, PA-C  levothyroxine (SYNTHROID, LEVOTHROID) 88 MCG tablet Take 1 tablet (88 mcg total) by mouth daily. 10/25/14  Yes [name] Sou, MD  magnesium oxide (MAG-OX) 400 (241.3 MG) MG tablet Take 1 tablet (400 mg total) by mouth 3 (three) times daily. 01/09/15  Yes [name] Sou, MD  pantoprazole (PROTONIX) 40 MG tablet Take 1 tablet (40 mg total) by mouth daily. 10/18/14  Yes Ivan Anchors Love, PA-C  potassium chloride SA (KLOR-CON M20) 20 MEQ tablet Take 1 tablet (20 mEq total) by mouth daily. 01/12/15  Yes Albertine Patricia, MD  pravastatin (PRAVACHOL) 80 MG tablet Take 1 tablet (80 mg total) by mouth at bedtime. 01/02/15  Yes Jolaine Artist, MD  tiotropium (SPIRIVA HANDIHALER) 18 MCG inhalation capsule PLACE ONE CAPSULE INTO INHALER AND  INHALE  DAILY 12/25/14  Yes Noralee Space, MD  torsemide (DEMADEX) 20 MG tablet Take 2 tablets (40 mg total) by mouth 2 (two) times daily. Patient taking differently: Take 40 mg by mouth 2 (two) times daily. To be resumed when weight reaches 137lb 01/15/15   Jolaine Artist, MD   Allergies  Allergen Reactions  . Niacin Itching    Niaspan    CBC:    Component Value Date/Time   WBC 13.2* 01/23/2015 0728   HGB 9.3* 01/23/2015 0728   HCT 27.0* 01/23/2015 0728   PLT 154 01/23/2015 0728   MCV 83.3 01/23/2015 0728   NEUTROABS 11.1* 01/20/2015 1033   LYMPHSABS 1.1 01/20/2015 1033   MONOABS 2.1* 01/20/2015 1033   EOSABS 0.1 01/20/2015 1033   BASOSABS 0.0 01/20/2015 1033   Comprehensive Metabolic Panel:    Component Value Date/Time   NA 122* 01/23/2015 0728   K 4.8 01/23/2015 0728   CL 91* 01/23/2015 0728   CO2 23 01/23/2015 0728   BUN 24* 01/23/2015 0728   CREATININE 1.54* 01/23/2015 0728   GLUCOSE 128* 01/23/2015 0728   GLUCOSE 117* 03/16/2006 0913   CALCIUM 8.5* 01/23/2015 0728   AST 55* 01/23/2015 0728   ALT 24 01/23/2015 0728   ALKPHOS 91 01/23/2015 0728   BILITOT 1.6*  01/23/2015 0728   PROT 5.7* 01/23/2015 0728   ALBUMIN 2.1* 01/23/2015 0728    Physical Exam: Vital Signs: BP 116/50 mmHg  Pulse 71  Temp(Src) 98.2 F (36.8 C) (Oral)  Resp 20  Ht 5\' 7"  (1.702 m)  Wt 60.3 kg (132 lb 15 oz)  BMI 20.82 kg/m2  SpO2 96% SpO2: SpO2: 96 % O2 Device: O2 Device: Not Delivered O2 Flow Rate:   Intake/output summary:  Intake/Output Summary (Last 24 hours) at 01/23/15 1039 Last data filed at 01/23/15 0715  Gross per 24 hour  Intake    360 ml  Output   1100 ml  Net   -740 ml   LBM: Last BM Date: 01/21/15 Baseline Weight: Weight: 58.469 kg (128 lb 14.4 oz) Most recent weight: Weight: 60.3 kg (132 lb 15 oz)  Exam Findings:  Constitutional:  Deconditioned,  Makes and keeps eye contact.  Sitting up in chair.  Resp:  Even, mildly labored with extended conversation.  GI: Abd distended, non tender.  Extr:  No swelling noted X 4, TED hose BL LE.  Skin: thin, bruised skin, several skin tears on upper arms.          Palliative Performance Scale:            PPS:  50%   Additional Data Reviewed: Recent  Labs     01/21/15  0545  01/22/15  0325  01/23/15  0728  WBC  11.1*   --   13.2*  HGB  9.4*   --   9.3*  PLT  137*   --   154  NA  125*  122*  122*  BUN  20  22*  24*  CREATININE  1.39*  1.41*  1.54*     Time In: 0925 Time Out: 1030 Time Total:  55 minutes Greater than 50%  of this time was spent counseling and coordinating care related to the above assessment and plan.  Signed by: Drue Novel, NP  Drue Novel, NP  01/23/2015, 10:39 AM  Please contact Palliative Medicine Team phone at [phone] for questions and concerns.

## 2015-01-24 DIAGNOSIS — I951 Orthostatic hypotension: Secondary | ICD-10-CM

## 2015-01-24 LAB — MAGNESIUM: MAGNESIUM: 2 mg/dL (ref 1.7–2.4)

## 2015-01-24 LAB — BASIC METABOLIC PANEL
Anion gap: 9 (ref 5–15)
BUN: 24 mg/dL — AB (ref 6–20)
CALCIUM: 8.4 mg/dL — AB (ref 8.9–10.3)
CO2: 24 mmol/L (ref 22–32)
CREATININE: 1.36 mg/dL — AB (ref 0.61–1.24)
Chloride: 89 mmol/L — ABNORMAL LOW (ref 101–111)
GFR calc non Af Amer: 49 mL/min — ABNORMAL LOW (ref >60)
GFR, EST AFRICAN AMERICAN: 56 mL/min — AB (ref >60)
Glucose, Bld: 125 mg/dL — ABNORMAL HIGH (ref 65–99)
Potassium: 5 mmol/L (ref 3.5–5.1)
SODIUM: 122 mmol/L — AB (ref 135–145)

## 2015-01-24 LAB — GLUCOSE, CAPILLARY
GLUCOSE-CAPILLARY: 146 mg/dL — AB (ref 65–99)
Glucose-Capillary: 294 mg/dL — ABNORMAL HIGH (ref 65–99)
Glucose-Capillary: 150 mg/dL — ABNORMAL HIGH (ref 65–99)
Glucose-Capillary: 151 mg/dL — ABNORMAL HIGH (ref 65–99)

## 2015-01-24 NOTE — Progress Notes (Signed)
Advanced Heart Failure Rounding Note   Subjective:    Stable. Walking around room without difficulty.  Sodium remains 122.  Alert/oriented. No SOB. States he has a little swelling in his left leg compared to his right, but that one is always larger.  Holding diuretics. Up 2 lbs from yesterday with 59ml/hr x 6 hr of NS given.   Objective:   Weight Range: 134 lb 11.2 oz (61.1 kg) Body mass index is 21.09 kg/(m^2).   Vital Signs:   Temp:  [98.4 F (36.9 C)-98.8 F (37.1 C)] 98.4 F (36.9 C) (08/31 3419) Pulse Rate:  [67-90] 80 (08/31 1159) Resp:  [12-15] 15 (08/31 0632) BP: (81-171)/(35-144) 81/35 mmHg (08/31 1159) SpO2:  [91 %-98 %] 98 % (08/31 1111) Weight:  [134 lb 11.2 oz (61.1 kg)] 134 lb 11.2 oz (61.1 kg) (08/31 0634) Last BM Date: 01/21/15  Weight change: Filed Weights   01/22/15 0509 01/23/15 0536 01/24/15 0634  Weight: 128 lb 12 oz (58.4 kg) 132 lb 15 oz (60.3 kg) 134 lb 11.2 oz (61.1 kg)    Intake/Output:   Intake/Output Summary (Last 24 hours) at 01/24/15 1330 Last data filed at 01/24/15 0946  Gross per 24 hour  Intake    358 ml  Output   1275 ml  Net   -917 ml     Physical Exam: General: elderly and chronically ill appearing. Lying flat in bed with no respiratory distress. HEENT: normal Neck: supple. JVP 8-10 R side, with none visible on L side. ? Superficial vascularity. Carotids 2+ bilat; no bruits. No lymphadenopathy or thyromegaly noted, Cor: PMI nondisplaced. RRR. No rubs, gallops or murmurs noted Lungs: Diminished in bases Abdomen: soft, NT. Moderate distention. No HSM appreciated. No bruits or masses. +BS. Extremities: no cyanosis, clubbing, rash,. 1 + edema L leg.  Neuro: alert & orientedx3, cranial nerves grossly intact. moves all 4 extremities w/o difficulty. Affect pleasant  Telemetry: NSR in 70s  Labs: CBC  Recent Labs  01/23/15 0728  WBC 13.2*  HGB 9.3*  HCT 27.0*  MCV 83.3  PLT 622   Basic Metabolic Panel  Recent Labs   01/23/15 0728 01/24/15 0640  NA 122* 122*  K 4.8 5.0  CL 91* 89*  CO2 23 24  GLUCOSE 128* 125*  BUN 24* 24*  CALCIUM 8.5* 8.4*  MG  --  2.0   Liver Function Tests  Recent Labs  01/23/15 0728  AST 55*  ALT 24  ALKPHOS 91  BILITOT 1.6*  PROT 5.7*  ALBUMIN 2.1*   No results for input(s): LIPASE, AMYLASE in the last 72 hours. Cardiac Enzymes No results for input(s): CKTOTAL, CKMB, CKMBINDEX, TROPONINI in the last 72 hours.  BNP: BNP (last 3 results)  Recent Labs  12/02/14 1800 01/10/15 1730 01/20/15 1033  BNP 262.1* 304.5* 161.6*    ProBNP (last 3 results)  Recent Labs  01/24/14 1909 02/09/14 0945  PROBNP 6721.0* 2555.0*     D-Dimer No results for input(s): DDIMER in the last 72 hours. Hemoglobin A1C No results for input(s): HGBA1C in the last 72 hours. Fasting Lipid Panel No results for input(s): CHOL, HDL, LDLCALC, TRIG, CHOLHDL, LDLDIRECT in the last 72 hours. Thyroid Function Tests No results for input(s): TSH, T4TOTAL, T3FREE, THYROIDAB in the last 72 hours.  Invalid input(s): FREET3  Other results:     Imaging/Studies:  US Abdomen Complete  01/23/2015   CLINICAL DATA:  Ascites  EXAM: ULTRASOUND ABDOMEN COMPLETE  COMPARISON:  01/05/2015 paracentesis, abdominal ultrasound 02/14/2014  FINDINGS: Gallbladder: Sludge noted within the gallbladder neck with gallbladder wall thickening of 5 mm but no sonographic Murphy sign. A small amount of pericholecystic fluid is identified in the setting of moderate ascites.  Common bile duct: Diameter: 5 mm  Liver: Echogenic with coarse and nodular contour but no focal abnormality. No intrahepatic ductal dilatation  IVC: No abnormality visualized.  Pancreas: Obscured by bowel gas  Spleen: Size and appearance within normal limits.  Right Kidney: Length: 10.4 cm. Echogenicity within normal limits. No mass or hydronephrosis visualized.  Left Kidney: Length: 10.5 cm. Echogenicity within normal limits. No mass or  hydronephrosis visualized.  Abdominal aorta: No aneurysm visualized.  Other findings: Moderate ascites  IMPRESSION: Moderate ascites.  Echogenic liver with nodular contour most compatible with cirrhosis.  Gallbladder sludge with gallbladder wall thickening which itself may be due to cirrhosis and/or hypoproteinemia but could also indicate cholecystitis in the appropriate clinical context.   Electronically Signed   By: Conchita Paris M.D.   On: 01/23/2015 17:11    Latest Echo  Latest Cath   Medications:     Scheduled Medications: . amiodarone  100 mg Oral Daily  . feeding supplement (ENSURE ENLIVE)  237 mL Oral BID BM  . folic acid  1 mg Oral Daily  . guaiFENesin  600 mg Oral BID  . heparin  5,000 Units Subcutaneous 3 times per day  . insulin aspart  0-9 Units Subcutaneous TID WC  . insulin glargine  7 Units Subcutaneous Q2200  . levothyroxine  88 mcg Oral QAC breakfast  . magnesium oxide  400 mg Oral TID  . mometasone-formoterol  2 puff Inhalation BID  . pantoprazole  40 mg Oral Daily  . pravastatin  80 mg Oral QHS  . sodium chloride  3 mL Intravenous Q12H  . tiotropium  18 mcg Inhalation Daily    Infusions:    PRN Medications: acetaminophen **OR** acetaminophen, ondansetron **OR** ondansetron (ZOFRAN) IV   Assessment/Plan   1. Chronic systolic CHF: Ischemic cardiomyopathy. Has [employer] ICD. ECHO 7/16 EF 25-30%. NYHA III symptoms. He does not look volume overloaded on exam except for possible ascites.   - Volume status increasing. Getting close to resumption of diuretics.  May be able to restart tomorrow. - Given 75 ml/hr of 0.9% NS x 6 hrs past two days. - Pacemaker interrogated in ER. No significant arrhythmias.  - Will reassess baseline weight and optimal diuresis as he gets closer to discharge. - Will need close follow up in HF clinic. - Current weight 134 - previous dry weight 137 - PT/OT seeing 2. A fib/Aflutter: Maintaining SR on amiodarone. No  anticoagulation with fall risk.  3. CKD: Stable III.   - 1.36 today. Baseline of 1.7-2.0 over past several months. 4. CAD: s/p CABG.  - Continue pravastatin. 5. COPD 6. H/o VT:  - Has an ICD and is now stable on amiodarone.  7. Hyponatremia: Chronic but acutely worsened, possible due to overdiuresis (hypovolemic hyponatremia) in setting of ongoing use of torsemide after last discharge.   - Remains at 122 - Encouraged limit fluid intake. - Fluid restrict to 1500 cc.  8. Fall/orthostatic hypotension: Off BP active meds.  - No further syncope 9. 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  Abd US showed moderate ascites.  Dr. Sloan Leiter would like to try Samsca.  Will discuss with MD.  Na unchanged despite trials of IVF.  Length of Stay: 3   [name] PA-C 01/24/2015, 1:30 PM  Advanced  Heart Failure Team Pager [phone] (M-F; [place])  Please contact [facility] for night-coverage after hours (4p -7a ) and weekends on [email or web address]   Patient seen and examined with Oda Kilts, PA-C. We discussed all aspects of the encounter. I agree with the assessment and plan as stated above.   Slightly improved with NS. Would not give tolvaptan unless he is volume overloaded. We will re-evaluate tomorrow. Continue to hold diuretics.  Dwayne Bulkley,MD 5:58 PM

## 2015-01-24 NOTE — Progress Notes (Signed)
Physical Therapy Treatment Patient Details Name: [name] MRN: [id number] DOB: 06-02-1935 Today's Date: 01/24/2015    History of Present Illness Pt is a 79 y/o M admitted after episode of syncope, wife found him on the floor.  [name] PMH includes ventricular tachycardia, HTN, PVD, CAD, COPD, anxiety, DMII, chronic LBP.    PT Comments    Pt more SOB today and noticed rattling sounds when he breathed. RN notified and she assessed his lungs. O2 sats 98%. Pt also c/o his ribs hurting. Pt ambulated in the hall with RW 50 ft min assist. Pt presented with dyspnea and decreased balance. Pt required an extensive recovery time from his walk. Pt is slowly progressing and agree with d/c plan. Pt will continue to benefit from skilled PT. Encourage pt to get OOB BID.  Follow Up Recommendations  Home health PT;Supervision/Assistance - 24 hour     Equipment Recommendations  None recommended by PT    Recommendations for Other Services       Precautions / Restrictions Precautions Precautions: Fall Restrictions Weight Bearing Restrictions: No    Mobility  Bed Mobility Overal bed mobility: Needs Assistance Bed Mobility: Supine to Sit     Supine to sit: Min assist     General bed mobility comments: min assist secondary to rib pain  Transfers Overall transfer level: Needs assistance Equipment used: Rolling walker (2 wheeled) Transfers: Sit to/from Stand Sit to Stand: Min guard            Ambulation/Gait Ambulation/Gait assistance: Min guard Ambulation Distance (Feet): 50 Feet Assistive device: Rolling walker (2 wheeled) Gait Pattern/deviations: Step-through pattern;Decreased stride length   Gait velocity interpretation: Below normal speed for age/gender General Gait Details: Mild instability noted during ambulation.  Close min guard for safety.     Stairs            Wheelchair Mobility    Modified Rankin (Stroke Patients Only)       Balance Overall balance  assessment: Needs assistance         Standing balance support: Bilateral upper extremity supported Standing balance-Leahy Scale: Fair                      Cognition Arousal/Alertness: Awake/alert Behavior During Therapy: WFL for tasks assessed/performed Overall Cognitive Status: Within Functional Limits for tasks assessed                      Exercises      General Comments General comments (skin integrity, edema, etc.): Increased SOB and rattling noted from his chest. RN notifed. O2 sats 98%      Pertinent Vitals/Pain Pain Assessment: Faces Pain Score: 4  Pain Location: ribs Pain Intervention(s): Limited activity within patient's tolerance    Home Living                      Prior Function            PT Goals (current goals can now be found in the care plan section) Progress towards PT goals: Progressing toward goals    Frequency  Min 3X/week    PT Plan Current plan remains appropriate    Co-evaluation             End of Session Equipment Utilized During Treatment: Gait belt Activity Tolerance: Patient limited by fatigue Patient left: in chair;with call bell/phone within reach;with family/visitor present     Time: 6734-1937 PT Time Calculation (min) (  ACUTE ONLY): 32 min  Charges:  $Gait Training: 8-22 mins $Therapeutic Activity: 8-22 mins                    G Codes:      Lelon Mast 01/24/2015, 11:16 AM

## 2015-01-24 NOTE — Progress Notes (Signed)
PATIENT DETAILS Name: [name] Age: 79 y.o. Sex: male Date of Birth: 06/08/1935 Admit Date: 01/20/2015 Admitting Physician No admitting provider for patient encounter. KGM:WNUUVOZ,DGUYQI H, MD  Subjective: No SOB/leg edema-no other complaints  Assessment/Plan: Active Problems: Syncope:likely due to dehydration/orthostatics. Unfortunately after his most recent hospital discharge,patient continued to take diuretics, inspite of being told to hold till he reached dry weight of 137 lbs. Cards consulted-supportive care  Hyponatremia:secondary to CHF-euvolemic on exam-unchanged despite 3 trials of IVF. ?restart diuretics or try Samsca-defer to cards  Chronic systolic CHF (congestive heart failure), NYHA class 4:last EF 25-30%, reviewed prior cards note-recommendations were to start Demadex once weight was up to 137 or greater-current weight at 134 lbs. Clinically without edema-but appears to have some ascites-but belly is not tense.   Mildly elevated LFT's:appears chronic-suspect from hepatin congestion-follow  Diabetes mellitus type 2, controlled: CBG's controlled- continue lantus 7 units and SSI  CKD (chronic kidney disease) stage 3, GFR 30-59 ml/min:creatinine close to usual baseline, follow  Hypothyroidism:continue synthroid. TSH normal.  GERD without esophagitis: continue PPI  HLD (hyperlipidemia): continue statins  Hx of A. Fib: sinus rhythm, continue amiodarone, reviewed prior cards notes-not anticoagulated due to fall risk and h/o bleeding from AVM's  GERD without esophagitis: continue PPI  Severe Protein calorie malnutrition: await Nutrition eval  Weakness generalized: due to electrolytes abnormalities and deconditioning . PT evaluation completed-HHPT on discharge  History ventricular tachycardia-AICD (automatic cardioverter/defibrillator) present  Failure to thrive synd:patient has been functionally declining over the past few months-has now  developed severe hyponatremia/orthostatics-unable to use diuretics to manage CHF-very poor overall prognoses-appreciate Palliative care input  Disposition: Remain inpatient-HHPT vs SNF  Antimicrobial agents  See below  Anti-infectives    None      DVT Prophylaxis: Prophylactic Heparin   Code Status: Full code  Family Communication None at bedside  Procedures: None  CONSULTS:  None  Time spent 25 minutes-Greater than 50% of this time was spent in counseling, explanation of diagnosis, planning of further management, and coordination of care.  MEDICATIONS: Scheduled Meds: . amiodarone  100 mg Oral Daily  . feeding supplement (ENSURE ENLIVE)  237 mL Oral BID BM  . folic acid  1 mg Oral Daily  . guaiFENesin  600 mg Oral BID  . heparin  5,000 Units Subcutaneous 3 times per day  . insulin aspart  0-9 Units Subcutaneous TID WC  . insulin glargine  7 Units Subcutaneous Q2200  . levothyroxine  88 mcg Oral QAC breakfast  . magnesium oxide  400 mg Oral TID  . mometasone-formoterol  2 puff Inhalation BID  . pantoprazole  40 mg Oral Daily  . pravastatin  80 mg Oral QHS  . sodium chloride  3 mL Intravenous Q12H  . tiotropium  18 mcg Inhalation Daily   Continuous Infusions:   PRN Meds:.acetaminophen **OR** acetaminophen, ondansetron **OR** ondansetron (ZOFRAN) IV    PHYSICAL EXAM: Vital signs in last 24 hours: Filed Vitals:   01/24/15 1111 01/24/15 1155 01/24/15 1157 01/24/15 1159  BP:  110/46 109/37 81/35  Pulse:  67 69 80  Temp:      TempSrc:      Resp:      Height:      Weight:      SpO2: 98%       Weight change: 0.8 kg (1 lb 12.2 oz) Filed Weights   01/22/15 0509 01/23/15 0536 01/24/15 3474  Weight: 58.4 kg (128 lb 12 oz) 60.3 kg (132 lb 15 oz) 61.1 kg (134 lb 11.2 oz)   Body mass index is 21.09 kg/(m^2).   Gen Exam: Awake and alert with clear speech. Neck: Supple, No JVD.   Chest: B/L Clear.   CVS: S1 S2 Regular, no murmurs.  Abdomen: soft, BS +,  non tender, mild to mod distention-+dullness at flanks on percussion-not tense Extremities: no edema, lower extremities warm to touch. Neurologic: Non Focal.   Skin: No Rash.   Wounds: N/A.   Intake/Output from previous day:  Intake/Output Summary (Last 24 hours) at 01/24/15 1252 Last data filed at 01/24/15 0946  Gross per 24 hour  Intake    558 ml  Output   1275 ml  Net   -717 ml     LAB RESULTS: CBC  Recent Labs Lab 01/20/15 1033 01/20/15 1658 01/21/15 0545 01/23/15 0728  WBC 14.3* 12.9* 11.1* 13.2*  HGB 10.0* 10.6* 9.4* 9.3*  HCT 28.7* 30.1* 27.3* 27.0*  PLT 146* 131* 137* 154  MCV 82.0 83.8 83.2 83.3  MCH 28.6 29.5 28.7 28.7  MCHC 34.8 35.2 34.4 34.4  RDW 19.0* 19.0* 19.2* 19.0*  LYMPHSABS 1.1  --   --   --   MONOABS 2.1*  --   --   --   EOSABS 0.1  --   --   --   BASOSABS 0.0  --   --   --     Chemistries   Recent Labs Lab 01/20/15 1033 01/20/15 1658 01/21/15 0545 01/22/15 0325 01/23/15 0728 01/24/15 0640  NA 121*  --  125* 122* 122* 122*  K 4.5  --  3.7 4.6 4.8 5.0  CL 87*  --  91* 90* 91* 89*  CO2 25  --  24 25 23 24   GLUCOSE 159*  --  139* 119* 128* 125*  BUN 27*  --  20 22* 24* 24*  CREATININE 1.81* 1.74* 1.39* 1.41* 1.54* 1.36*  CALCIUM 8.6*  --  8.4* 8.3* 8.5* 8.4*  MG 1.7  --   --   --   --  2.0    CBG:  Recent Labs Lab 01/23/15 0741 01/23/15 1159 01/23/15 1626 01/23/15 2134 01/24/15 0808  GLUCAP 130* 152* 163* 136* 146*    GFR Estimated Creatinine Clearance: 39.3 mL/min (by C-G formula based on Cr of 1.36).  Coagulation profile No results for input(s): INR, PROTIME in the last 168 hours.  Cardiac Enzymes No results for input(s): CKMB, TROPONINI, MYOGLOBIN in the last 168 hours.  Invalid input(s): CK  Invalid input(s): POCBNP No results for input(s): DDIMER in the last 72 hours. No results for input(s): HGBA1C in the last 72 hours. No results for input(s): CHOL, HDL, LDLCALC, TRIG, CHOLHDL, LDLDIRECT in the last 72  hours. No results for input(s): TSH, T4TOTAL, T3FREE, THYROIDAB in the last 72 hours.  Invalid input(s): FREET3 No results for input(s): VITAMINB12, FOLATE, FERRITIN, TIBC, IRON, RETICCTPCT in the last 72 hours. No results for input(s): LIPASE, AMYLASE in the last 72 hours.  Urine Studies No results for input(s): UHGB, CRYS in the last 72 hours.  Invalid input(s): UACOL, UAPR, USPG, UPH, UTP, UGL, UKET, UBIL, UNIT, UROB, ULEU, UEPI, UWBC, URBC, UBAC, CAST, UCOM, BILUA  MICROBIOLOGY: Recent Results (from the past 240 hour(s))  Urine culture     Status: None   Collection Time: 01/20/15 12:44 PM  Result Value Ref Range Status   Specimen Description URINE, RANDOM  Final  Special Requests NONE  Final   Culture MULTIPLE SPECIES PRESENT, SUGGEST RECOLLECTION  Final   Report Status 01/21/2015 FINAL  Final    RADIOLOGY STUDIES/RESULTS: Dg Chest 2 View  01/20/2015   CLINICAL DATA:  Dizziness, fell 1 day ago, history hypertension, type II diabetes mellitus, coronary artery disease post CABG, chronic systolic heart failure, stage III chronic renal insufficiency  EXAM: CHEST  2 VIEW  COMPARISON:  01/10/2015  FINDINGS: LEFT subclavian AICD with leads projecting at RIGHT ventricle.  Upper normal heart size post CABG.  Mediastinal contours pulmonary vascularity normal.  Atherosclerotic calcification aorta.  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  Emphysematous and chronic bronchitic changes with chronic accentuation of interstitial markings in the mid to lower lungs.  No acute infiltrate, pleural effusion, or pneumothorax.  Bones demineralized.  IMPRESSION: Changes of COPD and likely prior asbestos exposure.  Post CABG and AICD.  No acute abnormalities.   Electronically Signed   By: Lavonia Dana M.D.   On: 01/20/2015 11:50   Dg Chest 2 View  01/10/2015   CLINICAL DATA:  Weakness, cough, congestion.  Shortness of breath.  EXAM: CHEST  2 VIEW  COMPARISON:  12/02/2014   FINDINGS: There is hyperinflation of the lungs compatible with COPD. Bilateral calcified pleural plaques again noted, unchanged. No confluent airspace opacities or effusions. No acute bony abnormality. Left AICD rib remains in place, unchanged. Heart is normal size. Prior CABG.  IMPRESSION: Stable bilateral calcified pleural plaques. COPD. No active disease.   Electronically Signed   By: Rolm Baptise M.D.   On: 01/10/2015 13:57   Dg Elbow 2 Views Left  01/20/2015   CLINICAL DATA:  Initial encounter for posterior left elbow pain after fall. On blood thinners.  EXAM: LEFT ELBOW - 2 VIEW  COMPARISON:  None.  FINDINGS: AP and lateral views. Soft tissue injury superficial to the olecranon and proximal humerus. No acute fracture or dislocation. No joint effusion. Mild osteoarthritis involves the coronoid process of the ulna.  IMPRESSION: Soft tissue injury only.   Electronically Signed   By: Abigail Miyamoto M.D.   On: 01/20/2015 11:48   Ct Head Wo Contrast  01/10/2015   CLINICAL DATA:  Recurrent falls.  Generalized weakness.  EXAM: CT HEAD WITHOUT CONTRAST  TECHNIQUE: Contiguous axial images were obtained from the base of the skull through the vertex without intravenous contrast.  COMPARISON:  Head CT scan 11/03/2014 and 10/09/2014  FINDINGS: Mild atrophy is identified. There is no evidence of acute intracranial abnormality including hemorrhage, infarct, mass lesion, mass effect, midline shift or abnormal extra-axial fluid collection. No hydrocephalus or pneumocephalus. The calvarium is intact. Carotid atherosclerosis is noted.  IMPRESSION: No acute abnormality.  Mild atrophy.  Atherosclerosis.   Electronically Signed   By: Inge Rise M.D.   On: 01/10/2015 17:47   US Abdomen Complete  01/23/2015   CLINICAL DATA:  Ascites  EXAM: ULTRASOUND ABDOMEN COMPLETE  COMPARISON:  01/05/2015 paracentesis, abdominal ultrasound 02/14/2014  FINDINGS: Gallbladder: Sludge noted within the gallbladder neck with gallbladder wall  thickening of 5 mm but no sonographic Murphy sign. A small amount of pericholecystic fluid is identified in the setting of moderate ascites.  Common bile duct: Diameter: 5 mm  Liver: Echogenic with coarse and nodular contour but no focal abnormality. No intrahepatic ductal dilatation  IVC: No abnormality visualized.  Pancreas: Obscured by bowel gas  Spleen: Size and appearance within normal limits.  Right Kidney: Length: 10.4 cm. Echogenicity within normal limits. No mass  or hydronephrosis visualized.  Left Kidney: Length: 10.5 cm. Echogenicity within normal limits. No mass or hydronephrosis visualized.  Abdominal aorta: No aneurysm visualized.  Other findings: Moderate ascites  IMPRESSION: Moderate ascites.  Echogenic liver with nodular contour most compatible with cirrhosis.  Gallbladder sludge with gallbladder wall thickening which itself may be due to cirrhosis and/or hypoproteinemia but could also indicate cholecystitis in the appropriate clinical context.   Electronically Signed   By: Conchita Paris M.D.   On: 01/23/2015 17:11   US Paracentesis  01/05/2015   INDICATION: CHF, recurrent ascites. Request is made for therapeutic paracentesis.  EXAM: ULTRASOUND-GUIDED THERAPEUTIC PARACENTESIS  COMPARISON:  Prior paracentesis on  11/28/2014  MEDICATIONS: None.  COMPLICATIONS: None immediate  TECHNIQUE: Informed written consent was obtained from the patient after a discussion of the risks, benefits and alternatives to treatment. A timeout was performed prior to the initiation of the procedure.  Initial ultrasound scanning demonstrates a moderate amount of ascites within the right lower abdominal quadrant. The right lower abdomen was prepped and draped in the usual sterile fashion. 1% lidocaine was used for local anesthesia. Under direct ultrasound guidance, a 19 gauge, 10-cm, Yueh catheter was introduced. An ultrasound image was saved for documentation purposed. The paracentesis was performed. The catheter was  removed and a dressing was applied. The patient tolerated the procedure well without immediate post procedural complication.  FINDINGS: A total of approximately 2.7 liters of yellow fluid was removed.  IMPRESSION: Successful ultrasound-guided therapeutic paracentesis yielding 2.7 liters of peritoneal fluid.  Read by: Rowe Jyles, PA-C   Electronically Signed   By: Aletta Edouard M.D.   On: 01/05/2015 15:24    Oren Binet, MD  [facility] Pager:[phone]  If 7PM-7AM, please contact night-coverage [email or web address] Password [email or web address] 01/24/2015, 12:52 PM   LOS: 3 days

## 2015-01-24 NOTE — Progress Notes (Signed)
Occupational Therapy Treatment Patient Details Name: [name] MRN: [id number] DOB: 08/28/1935 Today's Date: 01/24/2015    History of present illness Pt is a 79 y/o M admitted after episode of syncope, wife found him on the floor.  [name] PMH includes ventricular tachycardia, HTN, PVD, CAD, COPD, anxiety, DMII, chronic LBP.   OT comments  Patient progressing toward OT goals, continue plan of care for now. Pt limited by increased fatigue/decreased endurance and low BP, BP during standing=81/34. NT took orthostatic vitals prior to OT entering room, please see navigator or doc flowsheets for information regarding that.    Follow Up Recommendations  Home health OT;Supervision/Assistance - 24 hour    Equipment Recommendations  3 in 1 bedside comode;Other (comment) (AE)    Recommendations for Other Services  None at this time   Precautions / Restrictions Precautions Precautions: Fall Precaution Comments: watch BP Restrictions Weight Bearing Restrictions: No    Mobility Bed Mobility Overal bed mobility: Needs Assistance Bed Mobility: Supine to Sit     Supine to sit: Min assist     General bed mobility comments: Pt found seated in recliner upon OT entering/exiting room  Transfers Overall transfer level: Needs assistance Equipment used: None Transfers: Sit to/from Stand Sit to Stand: Min guard         General transfer comment: No cues necessary, min guard for safety    Balance Overall balance assessment: Needs assistance Sitting-balance support: No upper extremity supported;Feet supported Sitting balance-Leahy Scale: Good     Standing balance support: No upper extremity supported (statically) Standing balance-Leahy Scale: Fair   ADL Overall ADL's : Needs assistance/impaired General ADL Comments: Pt continues to be limited by decreased activity tolerance/endurance and low BP during standing. Educated pt and wife on energy conservation techniques and administerred  energy conservation handout.      Cognition   Behavior During Therapy: WFL for tasks assessed/performed Overall Cognitive Status: Within Functional Limits for tasks assessed                 Pertinent Vitals/ Pain       Pain Assessment: No/denies pain Pain Score: 4  Pain Location: ribs Pain Intervention(s): Limited activity within patient's tolerance   Frequency Min 2X/week     Progress Toward Goals  OT Goals(current goals can now befound in the care plan section)  Progress towards OT goals: Progressing toward goals     Plan Discharge plan remains appropriate    End of Session     Activity Tolerance Patient tolerated treatment well   Patient Left in chair;with call bell/phone within reach;with family/visitor present    Time: 1205-1227 OT Time Calculation (min): 22 min  Charges: OT General Charges $OT Visit: 1 Procedure OT Treatments $Self Care/Home Management : 8-22 mins  [name] , MS, OTR/L, CLT Pager: [phone]  01/24/2015, 1:29 PM

## 2015-01-24 NOTE — Consult Note (Signed)
   [facility] Inpatient Consult   01/24/2015  DIQUAN KASSIS 1935/10/08 [id number]  Patient evaluated for [place] Management services. Spoke with inpatient RNCM regarding referral for multiple admission and chronic disease issues with hyponatremia, and HF.  Went to speak with the patient at bedside and patient was asleep.  Daughter at the bedside.  She called out to patient but he was asleep lying in bed flat.  She states, "maybe you can check back later." This writer will continue to follow up with the patient/wife when patient can participate in discussion.  Will follow up for needs. For questions, please contact: Natividad Brood, RN BSN [facility] Liaison  [phone] business mobile phone

## 2015-01-24 NOTE — Care Management Note (Addendum)
Case Management Note  Patient Details  Name: [name] MRN: [id number] Date of Birth: August 06, 1935  Subjective/Objective:                 Spent 45 minutes ta,king to daughter [name]. She states that her dad lives with her mom. Although her mom is cognitivly able to care for her dad, she has a hard time caring for him and providing 24 hour assistance, discussed HH aids, and private duty aids. Patient has had recurrent falls at home, as he becomes hyponatremic and too weak to even use his walker. Patient does not take his mediaction properly as it becomes confusing for him and wife with dosage changes, discussed having [facility] RN fill pill box for patient so correct medications are available daily. Patient currently receives [facility] RN and PT, but daughter not sure of what company. CM will check tomorrow. Patient's daughter questioned the need for a hospital bed at home, discussed that this can be delivered to the home, and should be covered by insurance as patient has severe CHF diagnosis.    Action/Plan:  Will continue to monitor and offer resources and [facility] as needed.  01-25-15 Patient is active with [facility] for [facility] PT and RN.   Expected Discharge Date:  01/22/15               Expected Discharge Plan:  [place]  In-House Referral:     Discharge planning Services  CM Consult  Post Acute Care Choice:    Choice offered to:     DME Arranged:    DME Agency:     HH Arranged:    HH Agency:     Status of Service:  In process, will continue to follow  Medicare Important Message Given:  Yes-second notification given Date Medicare IM Given:    Medicare IM give by:    Date Additional Medicare IM Given:    Additional Medicare Important Message give by:     If discussed at [place] of Stay Meetings, dates discussed:    Additional Comments:  Carles Collet, RN 01/24/2015, 3:26 PM

## 2015-01-25 LAB — CBC WITH DIFFERENTIAL/PLATELET
BASOS PCT: 0 % (ref 0–1)
Basophils Absolute: 0 10*3/uL (ref 0.0–0.1)
Eosinophils Absolute: 0.1 10*3/uL (ref 0.0–0.7)
Eosinophils Relative: 1 % (ref 0–5)
HEMATOCRIT: 30.3 % — AB (ref 39.0–52.0)
HEMOGLOBIN: 10.3 g/dL — AB (ref 13.0–17.0)
LYMPHS ABS: 1.6 10*3/uL (ref 0.7–4.0)
LYMPHS PCT: 11 % — AB (ref 12–46)
MCH: 28.9 pg (ref 26.0–34.0)
MCHC: 34 g/dL (ref 30.0–36.0)
MCV: 84.9 fL (ref 78.0–100.0)
MONOS PCT: 19 % — AB (ref 3–12)
Monocytes Absolute: 2.7 10*3/uL — ABNORMAL HIGH (ref 0.1–1.0)
NEUTROS PCT: 69 % (ref 43–77)
Neutro Abs: 10.1 10*3/uL — ABNORMAL HIGH (ref 1.7–7.7)
Platelets: 158 10*3/uL (ref 150–400)
RBC: 3.57 MIL/uL — ABNORMAL LOW (ref 4.22–5.81)
RDW: 19.2 % — ABNORMAL HIGH (ref 11.5–15.5)
WBC: 14.5 10*3/uL — ABNORMAL HIGH (ref 4.0–10.5)

## 2015-01-25 LAB — GLUCOSE, CAPILLARY
GLUCOSE-CAPILLARY: 162 mg/dL — AB (ref 65–99)
Glucose-Capillary: 167 mg/dL — ABNORMAL HIGH (ref 65–99)
Glucose-Capillary: 175 mg/dL — ABNORMAL HIGH (ref 65–99)
Glucose-Capillary: 154 mg/dL — ABNORMAL HIGH (ref 65–99)

## 2015-01-25 LAB — BASIC METABOLIC PANEL
ANION GAP: 9 (ref 5–15)
BUN: 26 mg/dL — ABNORMAL HIGH (ref 6–20)
CHLORIDE: 89 mmol/L — AB (ref 101–111)
CO2: 24 mmol/L (ref 22–32)
Calcium: 8.4 mg/dL — ABNORMAL LOW (ref 8.9–10.3)
Creatinine, Ser: 1.41 mg/dL — ABNORMAL HIGH (ref 0.61–1.24)
GFR calc non Af Amer: 47 mL/min — ABNORMAL LOW (ref >60)
GFR, EST AFRICAN AMERICAN: 54 mL/min — AB (ref >60)
GLUCOSE: 120 mg/dL — AB (ref 65–99)
POTASSIUM: 5.2 mmol/L — AB (ref 3.5–5.1)
Sodium: 122 mmol/L — ABNORMAL LOW (ref 135–145)

## 2015-01-25 MED ORDER — TOLVAPTAN 15 MG PO TABS
15.0000 mg | ORAL_TABLET | Freq: Once | ORAL | Status: AC
  Administered 2015-01-25: 15 mg via ORAL
  Filled 2015-01-25: qty 1

## 2015-01-25 MED ORDER — TORSEMIDE 20 MG PO TABS
20.0000 mg | ORAL_TABLET | Freq: Every day | ORAL | Status: DC
Start: 2015-01-26 — End: 2015-01-28
  Administered 2015-01-26 – 2015-01-28 (×3): 20 mg via ORAL
  Filled 2015-01-25 (×3): qty 1

## 2015-01-25 NOTE — Progress Notes (Signed)
Physical Therapy Treatment Patient Details Name: [name] MRN: [id number] DOB: 30-Nov-1935 Today's Date: 2015-02-02    History of Present Illness Pt is a 79 y/o M admitted after episode of syncope, wife found him on the floor.  [name] PMH includes ventricular tachycardia, HTN, PVD, CAD, COPD, anxiety, DMII, chronic LBP.    PT Comments    Patient making slow progress with mobility.  Fatigues quickly.  Agree with need for HHPT at discharge.  Follow Up Recommendations  Home health PT;Supervision/Assistance - 24 hour     Equipment Recommendations  None recommended by PT    Recommendations for Other Services       Precautions / Restrictions Precautions Precautions: Fall Restrictions Weight Bearing Restrictions: No    Mobility  Bed Mobility Overal bed mobility: Needs Assistance Bed Mobility: Supine to Sit;Sit to Supine     Supine to sit: Min assist Sit to supine: Min assist   General bed mobility comments: Verbal cues for technique.  Assist to raise trunk to upright position.  Required assist to bring LE's onto bed to return to supine.  Transfers Overall transfer level: Needs assistance Equipment used: Rolling walker (2 wheeled) Transfers: Sit to/from Stand Sit to Stand: Min guard         General transfer comment: Min guard for safety.  Increased time.  Ambulation/Gait Ambulation/Gait assistance: Min guard Ambulation Distance (Feet): 62 Feet Assistive device: Rolling walker (2 wheeled) Gait Pattern/deviations: Step-through pattern;Decreased stride length;Trunk flexed Gait velocity: Decreased Gait velocity interpretation: Below normal speed for age/gender General Gait Details: Mild instability noted during ambulation.  Close min guard for safety.  Fatigues quickly with dyspnea 4/4 at end of ambulation.   Stairs            Wheelchair Mobility    Modified Rankin (Stroke Patients Only)       Balance                                     Cognition Arousal/Alertness: Awake/alert Behavior During Therapy: WFL for tasks assessed/performed Overall Cognitive Status: Within Functional Limits for tasks assessed                      Exercises      General Comments        Pertinent Vitals/Pain Pain Assessment: No/denies pain    Home Living                      Prior Function            PT Goals (current goals can now be found in the care plan section) Progress towards PT goals: Progressing toward goals    Frequency  Min 3X/week    PT Plan Current plan remains appropriate    Co-evaluation             End of Session Equipment Utilized During Treatment: Gait belt Activity Tolerance: Patient limited by fatigue Patient left: in bed;with call bell/phone within reach;with bed alarm set;with family/visitor present     Time: 1749-4496 PT Time Calculation (min) (ACUTE ONLY): 15 min  Charges:  $Gait Training: 8-22 mins                    G Codes:      Despina Pole 2015-02-02, 1:57 PM Carita Pian. Sanjuana Kava, [place] Pager [phone]

## 2015-01-25 NOTE — Progress Notes (Signed)
Nutrition Follow-up  DOCUMENTATION CODES:   Not applicable  INTERVENTION:   -Continue Ensure Enlive po BID, each supplement provides 350 kcal and 20 grams of protein  NUTRITION DIAGNOSIS:   Inadequate oral intake related to poor appetite as evidenced by per patient/family report.  Ongoing  GOAL:   Patient will meet greater than or equal to 90% of their needs  Unmet  MONITOR:   PO intake, Supplement acceptance, Labs, Weight trends, Skin, I & O's  REASON FOR ASSESSMENT:   Malnutrition Screening Tool    ASSESSMENT:   [name] is a 79 y.o. male with PMH significant for CHF (Systolic in nature, NYHA class 4; last EF 25-30%), HTN, hx of A. Fib, ventricular tachycardia, AICD implantation, HLD, diabetes with nephropathy and CKD stage 3; who presented to ED after episode of syncope. Patient reported no prodromic symptoms when falling and endorses he just black out in his way to the bathroom while using his walker. Wife found him down and denies bladder or stool incontinence; there was also no post ictal episode. Patient was severely weak and EMS was called. Family reported increase urine frequency and just recently stopped torsemide, even was instructed on discharge last admission not to given unless weight above 137. Patient denies CP, fever, chills, dysuria, nausea, vomiting, abd pain, HA's and focal weakness. Family struggling providing degree of assistance and care that patient is demanding now; and per HHPT he is extremely deconditioned. In ED workup revealed signs of dehydration on PE, positive hyponatremia at level of 121, stable Creatinine and no acute infiltrates on CXR, an no acute intracranial abnormalities on CT head. TRH called to admit for further evaluation and treatment of syncope, dehydration and hyponatremia.   Pt sleeping soundly at time of visit.   Reviewed cardiology note. Edema is improving with TED house and weight is increasing due to holding of  diuretics.   Intake remains variable; noted 25-100% meal completion, averaging 25% meal completion over the past 48 hours. Pt is accepting Ensure supplements; RD will continue order.   Palliative care met with pt; pt is currently full code.   RNCM following. PT recommending home health.   Labs reviewed: Na: 122, K: 5.2.   Diet Order:  Diet heart healthy/carb modified Room service appropriate?: Yes; Fluid consistency:: Thin; Fluid restriction:: 1500 mL Fluid  Skin:  Reviewed, no issues  Last BM:  01/25/15  Height:   Ht Readings from Last 1 Encounters:  01/20/15 _0  (1.702 m)    Weight:   Wt Readings from Last 1 Encounters:  01/25/15 137 lb 2 oz (62.2 kg)    Ideal Body Weight:  67.3 kg  BMI:  Body mass index is 21.47 kg/(m^2).  Estimated Nutritional Needs:   Kcal:  1500-1700  Protein:  70-80 grams  Fluid:  >1.5 L  EDUCATION NEEDS:   No education needs identified at this time  Finnley [place] A. Jimmye Norman, RD, LDN, CDE Pager: [phone] After hours Pager: [phone]

## 2015-01-25 NOTE — Progress Notes (Signed)
Advanced Heart Failure Rounding Note   Subjective:    Feels better today.  No difficulty ambulating around room.  Sodium unchanged at 122.  Alert/oriented. Leg edema improved with TED hose.  Said his breathing is fine, just still feels weak.     Up 3 lbs from yesterday with continued holding of diuretics.  Not at weight he had been instructed to resume diuretics at home (137 lbs)   Objective:   Weight Range: 137 lb 2 oz (62.2 kg) Body mass index is 21.47 kg/(m^2).   Vital Signs:   Temp:  [98.9 F (37.2 C)-99.5 F (37.5 C)] 98.9 F (37.2 C) (09/01 0450) Pulse Rate:  [67-80] 77 (09/01 0450) Resp:  [18-20] 20 (09/01 0450) BP: (81-133)/(35-54) 111/35 mmHg (09/01 0450) SpO2:  [95 %-98 %] 96 % (09/01 0915) Weight:  [137 lb 2 oz (62.2 kg)] 137 lb 2 oz (62.2 kg) (09/01 0450) Last BM Date: 01/23/15  Weight change: Filed Weights   01/23/15 0536 01/24/15 0634 01/25/15 0450  Weight: 132 lb 15 oz (60.3 kg) 134 lb 11.2 oz (61.1 kg) 137 lb 2 oz (62.2 kg)    Intake/Output:   Intake/Output Summary (Last 24 hours) at 01/25/15 9935 Last data filed at 01/25/15 0458  Gross per 24 hour  Intake    118 ml  Output    550 ml  Net   -432 ml     Physical Exam: General: elderly and chronically ill appearing. NAD HEENT: normal Neck: supple. JVP 8-9 cm. Carotids 2+ bilat; no bruits. No lymphadenopathy or thyromegaly noted. Cor: PMI nondisplaced. RRR. No rubs, gallops or murmurs Lungs: Clear with dull bases. Abdomen: soft, NT. Mild - moderate distention. No HSM appreciated. No bruits or masses. Good bowel sounds. Extremities: no cyanosis, clubbing, rash. Trace edema, TED hose in place. Neuro: alert & orientedx3, cranial nerves grossly intact. moves all 4 extremities w/o difficulty. Affect pleasant  Telemetry: NSR in 70s  Labs: CBC  Recent Labs  01/23/15 0728 01/25/15 0629  WBC 13.2* 14.5*  NEUTROABS  --  10.1*  HGB 9.3* 10.3*  HCT 27.0* 30.3*  MCV 83.3 84.9  PLT 154 701   Basic  Metabolic Panel  Recent Labs  01/24/15 0640 01/25/15 0629  NA 122* 122*  K 5.0 5.2*  CL 89* 89*  CO2 24 24  GLUCOSE 125* 120*  BUN 24* 26*  CALCIUM 8.4* 8.4*  MG 2.0  --    Liver Function Tests  Recent Labs  01/23/15 0728  AST 55*  ALT 24  ALKPHOS 91  BILITOT 1.6*  PROT 5.7*  ALBUMIN 2.1*   No results for input(s): LIPASE, AMYLASE in the last 72 hours. Cardiac Enzymes No results for input(s): CKTOTAL, CKMB, CKMBINDEX, TROPONINI in the last 72 hours.  BNP: BNP (last 3 results)  Recent Labs  12/02/14 1800 01/10/15 1730 01/20/15 1033  BNP 262.1* 304.5* 161.6*    ProBNP (last 3 results)  Recent Labs  02/09/14 0945  PROBNP 2555.0*     D-Dimer No results for input(s): DDIMER in the last 72 hours. Hemoglobin A1C No results for input(s): HGBA1C in the last 72 hours. Fasting Lipid Panel No results for input(s): CHOL, HDL, LDLCALC, TRIG, CHOLHDL, LDLDIRECT in the last 72 hours. Thyroid Function Tests No results for input(s): TSH, T4TOTAL, T3FREE, THYROIDAB in the last 72 hours.  Invalid input(s): FREET3  Other results:     Imaging/Studies:  US Abdomen Complete  01/23/2015   CLINICAL DATA:  Ascites  EXAM: ULTRASOUND ABDOMEN COMPLETE  COMPARISON:  01/05/2015 paracentesis, abdominal ultrasound 02/14/2014  FINDINGS: Gallbladder: Sludge noted within the gallbladder neck with gallbladder wall thickening of 5 mm but no sonographic Murphy sign. A small amount of pericholecystic fluid is identified in the setting of moderate ascites.  Common bile duct: Diameter: 5 mm  Liver: Echogenic with coarse and nodular contour but no focal abnormality. No intrahepatic ductal dilatation  IVC: No abnormality visualized.  Pancreas: Obscured by bowel gas  Spleen: Size and appearance within normal limits.  Right Kidney: Length: 10.4 cm. Echogenicity within normal limits. No mass or hydronephrosis visualized.  Left Kidney: Length: 10.5 cm. Echogenicity within normal limits. No mass  or hydronephrosis visualized.  Abdominal aorta: No aneurysm visualized.  Other findings: Moderate ascites  IMPRESSION: Moderate ascites.  Echogenic liver with nodular contour most compatible with cirrhosis.  Gallbladder sludge with gallbladder wall thickening which itself may be due to cirrhosis and/or hypoproteinemia but could also indicate cholecystitis in the appropriate clinical context.   Electronically Signed   By: Conchita Paris M.D.   On: 01/23/2015 17:11    Latest Echo  Latest Cath   Medications:     Scheduled Medications: . amiodarone  100 mg Oral Daily  . feeding supplement (ENSURE ENLIVE)  237 mL Oral BID BM  . folic acid  1 mg Oral Daily  . guaiFENesin  600 mg Oral BID  . heparin  5,000 Units Subcutaneous 3 times per day  . insulin aspart  0-9 Units Subcutaneous TID WC  . insulin glargine  7 Units Subcutaneous Q2200  . levothyroxine  88 mcg Oral QAC breakfast  . magnesium oxide  400 mg Oral TID  . mometasone-formoterol  2 puff Inhalation BID  . pantoprazole  40 mg Oral Daily  . pravastatin  80 mg Oral QHS  . sodium chloride  3 mL Intravenous Q12H  . tiotropium  18 mcg Inhalation Daily    Infusions:    PRN Medications: acetaminophen **OR** acetaminophen, ondansetron **OR** ondansetron (ZOFRAN) IV   Assessment/Plan   1. Chronic systolic CHF: Ischemic cardiomyopathy. Has [employer] ICD. ECHO 7/16 EF 25-30%. NYHA III symptoms. - Volume status stable. At previous goal weight with diuresis. - Will try dose of Samsca to see if it improves his Na. - Pacemaker interrogated in ER. No significant arrhythmias.  - Will reassess baseline weight and optimal diuresis as he gets closer to discharge. - Will need close follow up in HF clinic. - PT/OT seeing 2. A fib/Aflutter: Maintaining SR on amiodarone. No anticoagulation with fall risk.  3. CKD: Stable III.   - 1.41 today. Baseline of 1.7-2.0 over past several months. 4. CAD: s/p CABG.  - Continue  pravastatin. 5. COPD 6. H/o VT:  - Has an ICD and is now stable on amiodarone.  7. Hyponatremia: Chronic but acutely worsened, possible due to overdiuresis (hypovolemic hyponatremia) in setting of ongoing use of torsemide after last discharge.   - Remains at 122 - Encouraged limit fluid intake. - Fluid restrict to 1500 cc.  8. Fall/orthostatic hypotension: Off BP active meds.  - No further syncope 9. 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  Abd US showed moderate ascites.  Will try dose of Samsca to see if it improves his Na.  Held initially as he was low volume, and it has diuretic potential. Continue to hold other diuretics. Will monitor fluid status very closely.  Length of Stay: 4    [name] PA-C 01/25/2015, 9:22 AM  Advanced Heart Failure Team Pager  [phone] (M-F; 7a - 4p)  Please contact [facility] for night-coverage after hours (4p -7a ) and weekends on [email or web address]  Patient seen with PA, agree with the above note.  He remains hyponatremic.  Volume is building back up, some JVD.   - Agree with Samsca today.  - Start back on torsemide 20 daily tomorrow.  - Abdominal distention with ascites: will arrange for paracentesis tomorrow.   - Repeat BMET in am, can use higher dose of Samsca tomorrow if needed.   [name] 01/25/2015 4:34 PM

## 2015-01-25 NOTE — Progress Notes (Signed)
PATIENT DETAILS Name: [name] Age: 79 y.o. Sex: male Date of Birth: Mar 14, 1936 Admit Date: 01/20/2015 Admitting Physician No admitting provider for patient encounter. PZW:CHENIDP,OEUMPN H, MD  Subjective: No SOB/leg edema-feels weak. No other complaints  Assessment/Plan: Active Problems: Syncope:likely due to dehydration/orthostatics. Unfortunately after his most recent hospital discharge,patient continued to take diuretics, inspite of being told to hold till he reached dry weight of 137 lbs.   Hyponatremia:secondary to CHF-euvolemic on exam-unchanged despite 3 trials of IVF. Cards recommending Samsca X 1 today  Chronic systolic CHF (congestive heart failure), NYHA class 4:last EF 25-30%, reviewed prior cards note-recommendations were to start Demadex once weight was up to 137 or greater-current weight at 137 lbs. Clinically without edema-but appears to have some ascites-but belly is not tense. Defer to CHF team  Mildly elevated LFT's:appears chronic-suspect from hepatin congestion-follow  Diabetes mellitus type 2, controlled: CBG's controlled- continue lantus 7 units and SSI  CKD (chronic kidney disease) stage 3, GFR 30-59 ml/min:creatinine close to usual baseline, follow  Hypothyroidism:continue synthroid. TSH normal.  GERD without esophagitis: continue PPI  HLD (hyperlipidemia): continue statins  Hx of A. Fib: sinus rhythm, continue amiodarone, reviewed prior cards notes-not anticoagulated due to fall risk and h/o bleeding from AVM's  GERD without esophagitis: continue PPI  Weakness generalized: due to electrolytes abnormalities and deconditioning . PT evaluation completed-HHPT on discharge  History ventricular tachycardia-AICD (automatic cardioverter/defibrillator) present  Failure to thrive synd:patient has been functionally declining over the past few months-has now developed severe hyponatremia/orthostatics-unable to use diuretics to manage  CHF-very poor overall prognoses-appreciate Palliative care input  Disposition: Remain inpatient-HHPT vs SNF  Antimicrobial agents  See below  Anti-infectives    None      DVT Prophylaxis: Prophylactic Heparin   Code Status: Full code  Family Communication None at bedside  Procedures: None  CONSULTS:  None  Time spent 20 minutes-Greater than 50% of this time was spent in counseling, explanation of diagnosis, planning of further management, and coordination of care.  MEDICATIONS: Scheduled Meds: . amiodarone  100 mg Oral Daily  . feeding supplement (ENSURE ENLIVE)  237 mL Oral BID BM  . folic acid  1 mg Oral Daily  . guaiFENesin  600 mg Oral BID  . heparin  5,000 Units Subcutaneous 3 times per day  . insulin aspart  0-9 Units Subcutaneous TID WC  . insulin glargine  7 Units Subcutaneous Q2200  . levothyroxine  88 mcg Oral QAC breakfast  . magnesium oxide  400 mg Oral TID  . mometasone-formoterol  2 puff Inhalation BID  . pantoprazole  40 mg Oral Daily  . pravastatin  80 mg Oral QHS  . sodium chloride  3 mL Intravenous Q12H  . tiotropium  18 mcg Inhalation Daily  . tolvaptan  15 mg Oral Once   Continuous Infusions:   PRN Meds:.acetaminophen **OR** acetaminophen, ondansetron **OR** ondansetron (ZOFRAN) IV    PHYSICAL EXAM: Vital signs in last 24 hours: Filed Vitals:   01/24/15 1159 01/24/15 2226 01/25/15 0450 01/25/15 0915  BP: 81/35 133/54 111/35   Pulse: 80 74 77   Temp:  99.5 F (37.5 C) 98.9 F (37.2 C)   TempSrc:  Oral Oral   Resp:  18 20   Height:      Weight:   62.2 kg (137 lb 2 oz)   SpO2:  95% 96% 96%    Weight change: 1.1 kg (2 lb  6.8 oz) Filed Weights   01/23/15 0536 01/24/15 0634 01/25/15 0450  Weight: 60.3 kg (132 lb 15 oz) 61.1 kg (134 lb 11.2 oz) 62.2 kg (137 lb 2 oz)   Body mass index is 21.47 kg/(m^2).   Gen Exam: Awake and alert with clear speech-looks chronically sick looking Neck: Supple, No JVD.   Chest: B/L Clear.     CVS: S1 S2 Regular, no murmurs.  Abdomen: soft, BS +, non tender, mild to mod distention-+dullness at flanks on percussion-not tense Extremities: no edema, lower extremities warm to touch.Ted hose in place Neurologic: Non Focal.   Skin: No Rash.   Wounds: N/A.   Intake/Output from previous day:  Intake/Output Summary (Last 24 hours) at 01/25/15 1323 Last data filed at 01/25/15 0458  Gross per 24 hour  Intake      0 ml  Output    550 ml  Net   -550 ml     LAB RESULTS: CBC  Recent Labs Lab 01/20/15 1033 01/20/15 1658 01/21/15 0545 01/23/15 0728 01/25/15 0629  WBC 14.3* 12.9* 11.1* 13.2* 14.5*  HGB 10.0* 10.6* 9.4* 9.3* 10.3*  HCT 28.7* 30.1* 27.3* 27.0* 30.3*  PLT 146* 131* 137* 154 158  MCV 82.0 83.8 83.2 83.3 84.9  MCH 28.6 29.5 28.7 28.7 28.9  MCHC 34.8 35.2 34.4 34.4 34.0  RDW 19.0* 19.0* 19.2* 19.0* 19.2*  LYMPHSABS 1.1  --   --   --  1.6  MONOABS 2.1*  --   --   --  2.7*  EOSABS 0.1  --   --   --  0.1  BASOSABS 0.0  --   --   --  0.0    Chemistries   Recent Labs Lab 01/20/15 1033  01/21/15 0545 01/22/15 0325 01/23/15 0728 01/24/15 0640 01/25/15 0629  NA 121*  --  125* 122* 122* 122* 122*  K 4.5  --  3.7 4.6 4.8 5.0 5.2*  CL 87*  --  91* 90* 91* 89* 89*  CO2 25  --  24 25 23 24 24   GLUCOSE 159*  --  139* 119* 128* 125* 120*  BUN 27*  --  20 22* 24* 24* 26*  CREATININE 1.81*  < > 1.39* 1.41* 1.54* 1.36* 1.41*  CALCIUM 8.6*  --  8.4* 8.3* 8.5* 8.4* 8.4*  MG 1.7  --   --   --   --  2.0  --   < > = values in this interval not displayed.  CBG:  Recent Labs Lab 01/24/15 1203 01/24/15 1821 01/24/15 2141 01/25/15 0812 01/25/15 1136  GLUCAP 294* 150* 151* 167* 162*    GFR Estimated Creatinine Clearance: 38.6 mL/min (by C-G formula based on Cr of 1.41).  Coagulation profile No results for input(s): INR, PROTIME in the last 168 hours.  Cardiac Enzymes No results for input(s): CKMB, TROPONINI, MYOGLOBIN in the last 168 hours.  Invalid  input(s): CK  Invalid input(s): POCBNP No results for input(s): DDIMER in the last 72 hours. No results for input(s): HGBA1C in the last 72 hours. No results for input(s): CHOL, HDL, LDLCALC, TRIG, CHOLHDL, LDLDIRECT in the last 72 hours. No results for input(s): TSH, T4TOTAL, T3FREE, THYROIDAB in the last 72 hours.  Invalid input(s): FREET3 No results for input(s): VITAMINB12, FOLATE, FERRITIN, TIBC, IRON, RETICCTPCT in the last 72 hours. No results for input(s): LIPASE, AMYLASE in the last 72 hours.  Urine Studies No results for input(s): UHGB, CRYS in the last 72 hours.  Invalid input(s):  UACOL, UAPR, USPG, UPH, UTP, UGL, UKET, UBIL, UNIT, UROB, ULEU, UEPI, UWBC, URBC, UBAC, CAST, UCOM, BILUA  MICROBIOLOGY: Recent Results (from the past 240 hour(s))  Urine culture     Status: None   Collection Time: 01/20/15 12:44 PM  Result Value Ref Range Status   Specimen Description URINE, RANDOM  Final   Special Requests NONE  Final   Culture MULTIPLE SPECIES PRESENT, SUGGEST RECOLLECTION  Final   Report Status 01/21/2015 FINAL  Final    RADIOLOGY STUDIES/RESULTS: Dg Chest 2 View  01/20/2015   CLINICAL DATA:  Dizziness, fell 1 day ago, history hypertension, type II diabetes mellitus, coronary artery disease post CABG, chronic systolic heart failure, stage III chronic renal insufficiency  EXAM: CHEST  2 VIEW  COMPARISON:  01/10/2015  FINDINGS: LEFT subclavian AICD with leads projecting at RIGHT ventricle.  Upper normal heart size post CABG.  Mediastinal contours pulmonary vascularity normal.  Atherosclerotic calcification aorta.  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  Emphysematous and chronic bronchitic changes with chronic accentuation of interstitial markings in the mid to lower lungs.  No acute infiltrate, pleural effusion, or pneumothorax.  Bones demineralized.  IMPRESSION: Changes of COPD and likely prior asbestos exposure.  Post CABG and AICD.  No acute  abnormalities.   Electronically Signed   By: Lavonia Dana M.D.   On: 01/20/2015 11:50   Dg Chest 2 View  01/10/2015   CLINICAL DATA:  Weakness, cough, congestion.  Shortness of breath.  EXAM: CHEST  2 VIEW  COMPARISON:  12/02/2014  FINDINGS: There is hyperinflation of the lungs compatible with COPD. Bilateral calcified pleural plaques again noted, unchanged. No confluent airspace opacities or effusions. No acute bony abnormality. Left AICD rib remains in place, unchanged. Heart is normal size. Prior CABG.  IMPRESSION: Stable bilateral calcified pleural plaques. COPD. No active disease.   Electronically Signed   By: Rolm Baptise M.D.   On: 01/10/2015 13:57   Dg Elbow 2 Views Left  01/20/2015   CLINICAL DATA:  Initial encounter for posterior left elbow pain after fall. On blood thinners.  EXAM: LEFT ELBOW - 2 VIEW  COMPARISON:  None.  FINDINGS: AP and lateral views. Soft tissue injury superficial to the olecranon and proximal humerus. No acute fracture or dislocation. No joint effusion. Mild osteoarthritis involves the coronoid process of the ulna.  IMPRESSION: Soft tissue injury only.   Electronically Signed   By: Abigail Miyamoto M.D.   On: 01/20/2015 11:48   Ct Head Wo Contrast  01/10/2015   CLINICAL DATA:  Recurrent falls.  Generalized weakness.  EXAM: CT HEAD WITHOUT CONTRAST  TECHNIQUE: Contiguous axial images were obtained from the base of the skull through the vertex without intravenous contrast.  COMPARISON:  Head CT scan 11/03/2014 and 10/09/2014  FINDINGS: Mild atrophy is identified. There is no evidence of acute intracranial abnormality including hemorrhage, infarct, mass lesion, mass effect, midline shift or abnormal extra-axial fluid collection. No hydrocephalus or pneumocephalus. The calvarium is intact. Carotid atherosclerosis is noted.  IMPRESSION: No acute abnormality.  Mild atrophy.  Atherosclerosis.   Electronically Signed   By: Inge Rise M.D.   On: 01/10/2015 17:47   US Abdomen  Complete  01/23/2015   CLINICAL DATA:  Ascites  EXAM: ULTRASOUND ABDOMEN COMPLETE  COMPARISON:  01/05/2015 paracentesis, abdominal ultrasound 02/14/2014  FINDINGS: Gallbladder: Sludge noted within the gallbladder neck with gallbladder wall thickening of 5 mm but no sonographic Murphy sign. A small amount of pericholecystic fluid is identified in  the setting of moderate ascites.  Common bile duct: Diameter: 5 mm  Liver: Echogenic with coarse and nodular contour but no focal abnormality. No intrahepatic ductal dilatation  IVC: No abnormality visualized.  Pancreas: Obscured by bowel gas  Spleen: Size and appearance within normal limits.  Right Kidney: Length: 10.4 cm. Echogenicity within normal limits. No mass or hydronephrosis visualized.  Left Kidney: Length: 10.5 cm. Echogenicity within normal limits. No mass or hydronephrosis visualized.  Abdominal aorta: No aneurysm visualized.  Other findings: Moderate ascites  IMPRESSION: Moderate ascites.  Echogenic liver with nodular contour most compatible with cirrhosis.  Gallbladder sludge with gallbladder wall thickening which itself may be due to cirrhosis and/or hypoproteinemia but could also indicate cholecystitis in the appropriate clinical context.   Electronically Signed   By: Conchita Paris M.D.   On: 01/23/2015 17:11   US Paracentesis  01/05/2015   INDICATION: CHF, recurrent ascites. Request is made for therapeutic paracentesis.  EXAM: ULTRASOUND-GUIDED THERAPEUTIC PARACENTESIS  COMPARISON:  Prior paracentesis on  11/28/2014  MEDICATIONS: None.  COMPLICATIONS: None immediate  TECHNIQUE: Informed written consent was obtained from the patient after a discussion of the risks, benefits and alternatives to treatment. A timeout was performed prior to the initiation of the procedure.  Initial ultrasound scanning demonstrates a moderate amount of ascites within the right lower abdominal quadrant. The right lower abdomen was prepped and draped in the usual sterile  fashion. 1% lidocaine was used for local anesthesia. Under direct ultrasound guidance, a 19 gauge, 10-cm, Yueh catheter was introduced. An ultrasound image was saved for documentation purposed. The paracentesis was performed. The catheter was removed and a dressing was applied. The patient tolerated the procedure well without immediate post procedural complication.  FINDINGS: A total of approximately 2.7 liters of yellow fluid was removed.  IMPRESSION: Successful ultrasound-guided therapeutic paracentesis yielding 2.7 liters of peritoneal fluid.  Read by: Rowe Carlton, PA-C   Electronically Signed   By: Aletta Edouard M.D.   On: 01/05/2015 15:24    Oren Binet, MD  [facility] Pager:[phone]  If 7PM-7AM, please contact night-coverage [email or web address] Password [email or web address] 01/25/2015, 1:23 PM   LOS: 4 days

## 2015-01-25 NOTE — Consult Note (Signed)
   [facility] CM Inpatient Consult   01/25/2015  FUE CERVENKA 1936/02/26 [id number] Follow up to referral.  Met with the patient and his daughter at the bedside.  Introduced patient to the benefits of [facility] for disease and care management.  Patient states he is already active with [facility].  Explained how [facility] does not interfere with or replace any of the home health services. Patient and daughter states that they would like to look over the [facility] information folder and share it with his wife.  Contact information given.  Encouraged them to call this Probation officer for questions: Natividad Brood, RN BSN [facility] Liaison  [phone] business mobile phone

## 2015-01-26 ENCOUNTER — Inpatient Hospital Stay (HOSPITAL_COMMUNITY): Payer: Medicare Other

## 2015-01-26 LAB — COMPREHENSIVE METABOLIC PANEL
ALK PHOS: 105 U/L (ref 38–126)
ALT: 23 U/L (ref 17–63)
ANION GAP: 6 (ref 5–15)
AST: 53 U/L — ABNORMAL HIGH (ref 15–41)
Albumin: 1.9 g/dL — ABNORMAL LOW (ref 3.5–5.0)
BUN: 28 mg/dL — ABNORMAL HIGH (ref 6–20)
CALCIUM: 8.3 mg/dL — AB (ref 8.9–10.3)
CO2: 25 mmol/L (ref 22–32)
CREATININE: 1.49 mg/dL — AB (ref 0.61–1.24)
Chloride: 93 mmol/L — ABNORMAL LOW (ref 101–111)
GFR, EST AFRICAN AMERICAN: 50 mL/min — AB (ref >60)
GFR, EST NON AFRICAN AMERICAN: 44 mL/min — AB (ref >60)
Glucose, Bld: 128 mg/dL — ABNORMAL HIGH (ref 65–99)
Potassium: 4.9 mmol/L (ref 3.5–5.1)
SODIUM: 124 mmol/L — AB (ref 135–145)
TOTAL PROTEIN: 5.5 g/dL — AB (ref 6.5–8.1)
Total Bilirubin: 1.7 mg/dL — ABNORMAL HIGH (ref 0.3–1.2)

## 2015-01-26 LAB — GRAM STAIN

## 2015-01-26 LAB — GLUCOSE, CAPILLARY
GLUCOSE-CAPILLARY: 136 mg/dL — AB (ref 65–99)
GLUCOSE-CAPILLARY: 195 mg/dL — AB (ref 65–99)
GLUCOSE-CAPILLARY: 175 mg/dL — AB (ref 65–99)
GLUCOSE-CAPILLARY: 139 mg/dL — AB (ref 65–99)

## 2015-01-26 LAB — LACTATE DEHYDROGENASE, PLEURAL OR PERITONEAL FLUID: LD, Fluid: 26 U/L — ABNORMAL HIGH (ref 3–23)

## 2015-01-26 LAB — PROTEIN, BODY FLUID

## 2015-01-26 LAB — BODY FLUID CELL COUNT WITH DIFFERENTIAL
Eos, Fluid: 0 %
LYMPHS FL: 9 %
MONOCYTE-MACROPHAGE-SEROUS FLUID: 76 % (ref 50–90)
NEUTROPHIL FLUID: 15 % (ref 0–25)
WBC FLUID: 201 uL (ref 0–1000)

## 2015-01-26 LAB — LACTATE DEHYDROGENASE: LDH: 144 U/L (ref 98–192)

## 2015-01-26 MED ORDER — TOLVAPTAN 15 MG PO TABS
15.0000 mg | ORAL_TABLET | Freq: Once | ORAL | Status: DC
  Filled 2015-01-26 (×2): qty 1

## 2015-01-26 NOTE — Care Management Important Message (Signed)
Important Message  Patient Details  Name: [name] MRN: [id number] Date of Birth: 1936/01/25   Medicare Important Message Given:  Yes-third notification given    Nathen May 01/26/2015, 10:51 AMImportant Message  Patient Details  Name: [name] MRN: [id number] Date of Birth: [birth year]   Medicare Important Message Given:  Yes-third notification given    Nathen May 01/26/2015, 10:51 AM

## 2015-01-26 NOTE — Care Management Note (Signed)
Case Management Note  Patient Details  Name: KULLEN TOMASETTI MRN: [id number] Date of Birth: 10/08/1935  Subjective/Objective:                 Spent 45 minutes ta,king to daughter [name]. She states that her dad lives with her mom. Although her mom is cognitivly able to care for her dad, she has a hard time caring for him and providing 24 hour assistance, discussed HH aids, and private duty aids. Patient has had recurrent falls at home, as he becomes hyponatremic and too weak to even use his walker. Patient does not take his mediaction properly as it becomes confusing for him and wife with dosage changes, discussed having [facility] RN fill pill box for patient so correct medications are available daily. Patient currently receives [facility] RN and PT, but daughter not sure of what company. CM will check tomorrow. Patient's daughter questioned the need for a hospital bed at home, discussed that this can be delivered to the home, and should be covered by insurance as patient has severe CHF diagnosis.   01-26-15 Spoke with patient's wife at the bedside, she declines a hospital bed but would like a 3in1. CM placed order.   Action/Plan:  Patient active with [facility], resumption order placed.   Expected Discharge Date:  01/22/15               Expected Discharge Plan:  [facility]  In-House Referral:     Discharge planning Services  CM Consult  Post Acute Care Choice:    Choice offered to:     DME Arranged:    DME Agency:     HH Arranged:    HH Agency:     Status of Service:  In process, will continue to follow  Medicare Important Message Given:  Yes-third notification given Date Medicare IM Given:    Medicare IM give by:    Date Additional Medicare IM Given:    Additional Medicare Important Message give by:     If discussed at [place] of Stay Meetings, dates discussed:    Additional Comments:  Carles Collet, RN 01/26/2015, 11:41 AM

## 2015-01-26 NOTE — Progress Notes (Signed)
Advanced Heart Failure Rounding Note   Subjective:    Feels good today.  Energy improving. Walking around room. Not dizzy, lighteaded. No near syncope, CP, or orthopnea.  Says breathing is fine, but belly feels bloated.  Sodium 122 -> 124 Weight stable at 137 lb without diuretics.  Got 1st dose of Samsca (15mg ) yesterday.   Objective:   Weight Range: 137 lb 9.1 oz (62.4 kg) Body mass index is 21.54 kg/(m^2).   Vital Signs:   Temp:  [97.9 F (36.6 C)-98.7 F (37.1 C)] 97.9 F (36.6 C) (09/02 0552) Pulse Rate:  [72-80] 72 (09/02 0552) Resp:  [18] 18 (09/01 2113) BP: (85-122)/(40-75) 85/41 mmHg (09/02 0600) SpO2:  [94 %-97 %] 94 % (09/02 0834) Weight:  [137 lb 9.1 oz (62.4 kg)] 137 lb 9.1 oz (62.4 kg) (09/02 0556) Last BM Date: 01/23/15  Weight change: Filed Weights   01/24/15 0634 01/25/15 0450 01/26/15 0556  Weight: 134 lb 11.2 oz (61.1 kg) 137 lb 2 oz (62.2 kg) 137 lb 9.1 oz (62.4 kg)    Intake/Output:   Intake/Output Summary (Last 24 hours) at 01/26/15 0943 Last data filed at 01/26/15 0825  Gross per 24 hour  Intake    340 ml  Output   2175 ml  Net  -1835 ml     Physical Exam: General: elderly and chronically ill appearing. NAD HEENT: normal Neck: supple. JVP 8 cm. Carotids 2+ bilat; no bruits. No lymphadenopathy or thyromegaly noted. Cor: PMI nondisplaced. RRR. No rubs, gallops or murmurs Lungs: Clear with dull bases. Abdomen: soft, NT, moderate distention. No HSM . No bruits or masses. +BS Extremities: no cyanosis, clubbing, rash. TED hose in place. Neuro: alert & orientedx3, cranial nerves grossly intact. moves all 4 extremities w/o difficulty. Affect pleasant  Telemetry: NSR in 70s  Labs: CBC  Recent Labs  01/25/15 0629  WBC 14.5*  NEUTROABS 10.1*  HGB 10.3*  HCT 30.3*  MCV 84.9  PLT 426   Basic Metabolic Panel  Recent Labs  01/24/15 0640 01/25/15 0629 01/26/15 0621  NA 122* 122* 124*  K 5.0 5.2* 4.9  CL 89* 89* 93*  CO2 24 24 25    GLUCOSE 125* 120* 128*  BUN 24* 26* 28*  CALCIUM 8.4* 8.4* 8.3*  MG 2.0  --   --    Liver Function Tests  Recent Labs  01/26/15 0621  AST 53*  ALT 23  ALKPHOS 105  BILITOT 1.7*  PROT 5.5*  ALBUMIN 1.9*   No results for input(s): LIPASE, AMYLASE in the last 72 hours. Cardiac Enzymes No results for input(s): CKTOTAL, CKMB, CKMBINDEX, TROPONINI in the last 72 hours.  BNP: BNP (last 3 results)  Recent Labs  12/02/14 1800 01/10/15 1730 01/20/15 1033  BNP 262.1* 304.5* 161.6*    ProBNP (last 3 results)  Recent Labs  02/09/14 0945  PROBNP 2555.0*     D-Dimer No results for input(s): DDIMER in the last 72 hours. Hemoglobin A1C No results for input(s): HGBA1C in the last 72 hours. Fasting Lipid Panel No results for input(s): CHOL, HDL, LDLCALC, TRIG, CHOLHDL, LDLDIRECT in the last 72 hours. Thyroid Function Tests No results for input(s): TSH, T4TOTAL, T3FREE, THYROIDAB in the last 72 hours.  Invalid input(s): FREET3  Other results:     Imaging/Studies:  No results found.  Latest Echo  Latest Cath   Medications:     Scheduled Medications: . amiodarone  100 mg Oral Daily  . feeding supplement (ENSURE ENLIVE)  237 mL Oral BID  BM  . folic acid  1 mg Oral Daily  . guaiFENesin  600 mg Oral BID  . heparin  5,000 Units Subcutaneous 3 times per day  . insulin aspart  0-9 Units Subcutaneous TID WC  . insulin glargine  7 Units Subcutaneous Q2200  . levothyroxine  88 mcg Oral QAC breakfast  . magnesium oxide  400 mg Oral TID  . mometasone-formoterol  2 puff Inhalation BID  . pantoprazole  40 mg Oral Daily  . pravastatin  80 mg Oral QHS  . sodium chloride  3 mL Intravenous Q12H  . tiotropium  18 mcg Inhalation Daily  . tolvaptan  15 mg Oral Once  . torsemide  20 mg Oral Daily    Infusions:    PRN Medications: acetaminophen **OR** acetaminophen, ondansetron **OR** ondansetron (ZOFRAN) IV   Assessment/Plan   1. Chronic systolic CHF: Ischemic  cardiomyopathy. Has [employer] ICD. ECHO 7/16 EF 25-30%. NYHA III symptoms. - Volume status stable. At previous goal weight of 137 lbs. - Continue Samsca - Start 20 mg torsemide daily today. - Will need close follow up in HF clinic. - PT/OT seeing - Pacemaker interrogated in ER. No significant arrhythmias.  2. A fib/Aflutter: Maintaining SR on amiodarone. No anticoagulation with fall risk.  3. CKD: Stable III.   - 1.49 today. Baseline of 1.7-2.0 over past several months. 4. CAD: s/p CABG.  - Continue pravastatin. 5. COPD 6. H/o VT:  - Has an ICD and is now stable on amiodarone.  7. Hyponatremia: Chronic but acutely worsened, possible due to overdiuresis (hypovolemic hyponatremia) in setting of ongoing use of torsemide after last discharge.   - Na 122 -> 124 with 1 dose of Samsca (15mg ). Will continue today. - Continue to limit fluid intake. - Fluid restrict to 1500 cc.  8. Fall/orthostatic hypotension: Off BP active meds.  - No further syncope 9. 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  Abd US showed moderate ascites. - Scheduled for [place] Abd with paracentesis this am.  Length of Stay: 5    [name] Friar PA-C 01/26/2015, 9:43 AM  Advanced Heart Failure Team Pager [phone] (M-F; 7a - 4p)  Please contact [facility] for night-coverage after hours (4p -7a ) and weekends on [email or web address]  Patient seen with PA, agree with the above note.  He remains hyponatremic but Na higher with Samsca yesterday. Volume is building back up, some JVD.  - Agree with Samsca 15 mg again today.  - Start back on torsemide 20 daily today.  - Abdominal distention with ascites: to have paracentesis today.   - Repeat BMET in am, hopefully home soon.  Continue to work with PT.   [name] 01/26/2015 1:08 PM

## 2015-01-26 NOTE — Progress Notes (Signed)
Physical Therapy Treatment Patient Details Name: [name] MRN: [id number] DOB: 1935-12-07 Today's Date: 01/26/2015    History of Present Illness Pt is a 79 y/o M admitted after episode of syncope, wife found him on the floor.  [name] PMH includes ventricular tachycardia, HTN, PVD, CAD, COPD, anxiety, DMII, chronic LBP.    PT Comments    Pt tolerated increased gait distance of 120' with RW this morning, distance limited by fatigue/dyspnea. HR 86 walking. Noted positive orthostatic BP from earlier this morning, however pt denied dizziness in sitting and standing.   Follow Up Recommendations  Home health PT;Supervision/Assistance - 24 hour     Equipment Recommendations  None recommended by PT    Recommendations for Other Services       Precautions / Restrictions Precautions Precautions: Fall Precaution Comments: watch BP; orthostatic but asymptomatic Restrictions Weight Bearing Restrictions: No    Mobility  Bed Mobility Overal bed mobility: Modified Independent Bed Mobility: Supine to Sit     Supine to sit: Modified independent (Device/Increase time)     General bed mobility comments: bed flat, used rail  Transfers Overall transfer level: Needs assistance Equipment used: Rolling walker (2 wheeled) Transfers: Sit to/from Stand Sit to Stand: Supervision         General transfer comment: increased time, good hand placement  Ambulation/Gait Ambulation/Gait assistance: Min guard Ambulation Distance (Feet): 120 Feet Assistive device: Rolling walker (2 wheeled) Gait Pattern/deviations: Step-through pattern;Decreased stride length;Trunk flexed Gait velocity: WFL Gait velocity interpretation: at or above normal speed for age/gender General Gait Details:  min guard for safety.  Fatigues quickly with dyspnea 3/4 at end of ambulation. HR 86 walking. No LOB.   Stairs            Wheelchair Mobility    Modified Rankin (Stroke Patients Only)        Balance     Sitting balance-Leahy Scale: Good       Standing balance-Leahy Scale: Fair                      Cognition Arousal/Alertness: Awake/alert Behavior During Therapy: WFL for tasks assessed/performed Overall Cognitive Status: Within Functional Limits for tasks assessed                      Exercises      General Comments        Pertinent Vitals/Pain Pain Score: 5  Pain Location: L ribs Pain Descriptors / Indicators: Sore Pain Intervention(s): Monitored during session;Patient requesting pain meds-RN notified;Limited activity within patient's tolerance    Home Living                      Prior Function            PT Goals (current goals can now be found in the care plan section) Acute Rehab PT Goals Patient Stated Goal: Go home PT Goal Formulation: With patient Time For Goal Achievement: 02/04/15 Potential to Achieve Goals: Good    Frequency  Min 3X/week    PT Plan Current plan remains appropriate    Co-evaluation             End of Session Equipment Utilized During Treatment: Gait belt Activity Tolerance: Patient limited by fatigue Patient left: with call bell/phone within reach;in chair;with chair alarm set     Time: 4098-1191 PT Time Calculation (min) (ACUTE ONLY): 12 min  Charges:  $Gait Training: 8-22 mins  G Codes:      Blondell Reveal Kistler 01/26/2015, 9:18 AM [phone]

## 2015-01-26 NOTE — Progress Notes (Signed)
PATIENT DETAILS Name: [name] Age: 79 y.o. Sex: male Date of Birth: 1935-08-24 Admit Date: 01/20/2015 Admitting Physician No admitting provider for patient encounter. PXT:GGYIRSW,NIOEVO H, MD  Subjective: No SOB/leg edema-feels weak. No other complaints  Assessment/Plan: Active Problems: Syncope:likely due to dehydration/orthostatics. Unfortunately after his most recent hospital discharge,patient continued to take diuretics, inspite of being told to hold till he reached dry weight of 137 lbs.   Hyponatremia:secondary to CHF-euvolemic on exam-Cards recommending to continue with Samsca-Na upto 124 today  Chronic systolic CHF (congestive heart failure), NYHA class 4:last EF 25-30%, reviewed prior cards note-recommendations were to start Demadex once weight was up to 137 or greater-current weight at 137 lbs. Clinically without edema-but appears to have some ascites-but belly is not tense. Defer to CHF team  Mildly elevated LFT's:appears chronic-suspect from hepatin congestion-follow  Diabetes mellitus type 2, controlled: CBG's controlled- continue lantus 7 units and SSI  CKD (chronic kidney disease) stage 3, GFR 30-59 ml/min:creatinine close to usual baseline, follow  Hypothyroidism:continue synthroid. TSH normal.  GERD without esophagitis: continue PPI  HLD (hyperlipidemia): continue statins  Hx of A. Fib: sinus rhythm, continue amiodarone, reviewed prior cards notes-not anticoagulated due to fall risk and h/o bleeding from AVM's  GERD without esophagitis: continue PPI  Weakness generalized: due to electrolytes abnormalities and deconditioning . PT evaluation completed-HHPT on discharge  History ventricular tachycardia-AICD (automatic cardioverter/defibrillator) present  Failure to thrive synd:patient has been functionally declining over the past few months-has now developed severe hyponatremia/orthostatics-unable to use diuretics to manage CHF-very  poor overall prognoses-appreciate Palliative care input  Disposition: Remain inpatient-HHPT vs SNF  Antimicrobial agents  See below  Anti-infectives    None      DVT Prophylaxis: Prophylactic Heparin   Code Status: Full code  Family Communication None at bedside  Procedures: None  CONSULTS:  None  Time spent 20 minutes-Greater than 50% of this time was spent in counseling, explanation of diagnosis, planning of further management, and coordination of care.  MEDICATIONS: Scheduled Meds: . amiodarone  100 mg Oral Daily  . feeding supplement (ENSURE ENLIVE)  237 mL Oral BID BM  . folic acid  1 mg Oral Daily  . guaiFENesin  600 mg Oral BID  . heparin  5,000 Units Subcutaneous 3 times per day  . insulin aspart  0-9 Units Subcutaneous TID WC  . insulin glargine  7 Units Subcutaneous Q2200  . levothyroxine  88 mcg Oral QAC breakfast  . magnesium oxide  400 mg Oral TID  . mometasone-formoterol  2 puff Inhalation BID  . pantoprazole  40 mg Oral Daily  . pravastatin  80 mg Oral QHS  . sodium chloride  3 mL Intravenous Q12H  . tiotropium  18 mcg Inhalation Daily  . tolvaptan  15 mg Oral Once  . torsemide  20 mg Oral Daily   Continuous Infusions:   PRN Meds:.acetaminophen **OR** acetaminophen, ondansetron **OR** ondansetron (ZOFRAN) IV    PHYSICAL EXAM: Vital signs in last 24 hours: Filed Vitals:   01/26/15 0556 01/26/15 0558 01/26/15 0600 01/26/15 0834  BP: 115/40 85/45 85/41    Pulse:      Temp:      TempSrc:      Resp:      Height:      Weight: 62.4 kg (137 lb 9.1 oz)     SpO2:    94%    Weight change: 0.2 kg (7.1 oz) Filed  Weights   01/24/15 0634 01/25/15 0450 01/26/15 0556  Weight: 61.1 kg (134 lb 11.2 oz) 62.2 kg (137 lb 2 oz) 62.4 kg (137 lb 9.1 oz)   Body mass index is 21.54 kg/(m^2).   Gen Exam: Awake and alert with clear speech-looks chronically sick looking Neck: Supple, No JVD.   Chest: B/L Clear.   CVS: S1 S2 Regular, no murmurs.    Abdomen: soft, BS +, non tender, mild to mod distention-+dullness at flanks on percussion-not tense Extremities: no edema, lower extremities warm to touch.Ted hose in place Neurologic: Non Focal.   Skin: No Rash.   Wounds: N/A.   Intake/Output from previous day:  Intake/Output Summary (Last 24 hours) at 01/26/15 1221 Last data filed at 01/26/15 0825  Gross per 24 hour  Intake    340 ml  Output   2175 ml  Net  -1835 ml     LAB RESULTS: CBC  Recent Labs Lab 01/20/15 1033 01/20/15 1658 01/21/15 0545 01/23/15 0728 01/25/15 0629  WBC 14.3* 12.9* 11.1* 13.2* 14.5*  HGB 10.0* 10.6* 9.4* 9.3* 10.3*  HCT 28.7* 30.1* 27.3* 27.0* 30.3*  PLT 146* 131* 137* 154 158  MCV 82.0 83.8 83.2 83.3 84.9  MCH 28.6 29.5 28.7 28.7 28.9  MCHC 34.8 35.2 34.4 34.4 34.0  RDW 19.0* 19.0* 19.2* 19.0* 19.2*  LYMPHSABS 1.1  --   --   --  1.6  MONOABS 2.1*  --   --   --  2.7*  EOSABS 0.1  --   --   --  0.1  BASOSABS 0.0  --   --   --  0.0    Chemistries   Recent Labs Lab 01/20/15 1033  01/22/15 0325 01/23/15 0728 01/24/15 0640 01/25/15 0629 01/26/15 0621  NA 121*  < > 122* 122* 122* 122* 124*  K 4.5  < > 4.6 4.8 5.0 5.2* 4.9  CL 87*  < > 90* 91* 89* 89* 93*  CO2 25  < > 25 23 24 24 25   GLUCOSE 159*  < > 119* 128* 125* 120* 128*  BUN 27*  < > 22* 24* 24* 26* 28*  CREATININE 1.81*  < > 1.41* 1.54* 1.36* 1.41* 1.49*  CALCIUM 8.6*  < > 8.3* 8.5* 8.4* 8.4* 8.3*  MG 1.7  --   --   --  2.0  --   --   < > = values in this interval not displayed.  CBG:  Recent Labs Lab 01/25/15 0812 01/25/15 1136 01/25/15 1637 01/25/15 2144 01/26/15 0827  GLUCAP 167* 162* 175* 154* 136*    GFR Estimated Creatinine Clearance: 36.6 mL/min (by C-G formula based on Cr of 1.49).  Coagulation profile No results for input(s): INR, PROTIME in the last 168 hours.  Cardiac Enzymes No results for input(s): CKMB, TROPONINI, MYOGLOBIN in the last 168 hours.  Invalid input(s): CK  Invalid input(s):  POCBNP No results for input(s): DDIMER in the last 72 hours. No results for input(s): HGBA1C in the last 72 hours. No results for input(s): CHOL, HDL, LDLCALC, TRIG, CHOLHDL, LDLDIRECT in the last 72 hours. No results for input(s): TSH, T4TOTAL, T3FREE, THYROIDAB in the last 72 hours.  Invalid input(s): FREET3 No results for input(s): VITAMINB12, FOLATE, FERRITIN, TIBC, IRON, RETICCTPCT in the last 72 hours. No results for input(s): LIPASE, AMYLASE in the last 72 hours.  Urine Studies No results for input(s): UHGB, CRYS in the last 72 hours.  Invalid input(s): UACOL, UAPR, USPG, UPH, UTP, UGL, UKET,  UBIL, UNIT, UROB, ULEU, UEPI, UWBC, URBC, UBAC, CAST, UCOM, BILUA  MICROBIOLOGY: Recent Results (from the past 240 hour(s))  Urine culture     Status: None   Collection Time: 01/20/15 12:44 PM  Result Value Ref Range Status   Specimen Description URINE, RANDOM  Final   Special Requests NONE  Final   Culture MULTIPLE SPECIES PRESENT, SUGGEST RECOLLECTION  Final   Report Status 01/21/2015 FINAL  Final    RADIOLOGY STUDIES/RESULTS: Dg Chest 2 View  01/20/2015   CLINICAL DATA:  Dizziness, fell 1 day ago, history hypertension, type II diabetes mellitus, coronary artery disease post CABG, chronic systolic heart failure, stage III chronic renal insufficiency  EXAM: CHEST  2 VIEW  COMPARISON:  01/10/2015  FINDINGS: LEFT subclavian AICD with leads projecting at RIGHT ventricle.  Upper normal heart size post CABG.  Mediastinal contours pulmonary vascularity normal.  Atherosclerotic calcification aorta.  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  Emphysematous and chronic bronchitic changes with chronic accentuation of interstitial markings in the mid to lower lungs.  No acute infiltrate, pleural effusion, or pneumothorax.  Bones demineralized.  IMPRESSION: Changes of COPD and likely prior asbestos exposure.  Post CABG and AICD.  No acute abnormalities.   Electronically Signed    By: Lavonia Dana M.D.   On: 01/20/2015 11:50   Dg Chest 2 View  01/10/2015   CLINICAL DATA:  Weakness, cough, congestion.  Shortness of breath.  EXAM: CHEST  2 VIEW  COMPARISON:  12/02/2014  FINDINGS: There is hyperinflation of the lungs compatible with COPD. Bilateral calcified pleural plaques again noted, unchanged. No confluent airspace opacities or effusions. No acute bony abnormality. Left AICD rib remains in place, unchanged. Heart is normal size. Prior CABG.  IMPRESSION: Stable bilateral calcified pleural plaques. COPD. No active disease.   Electronically Signed   By: Rolm Baptise M.D.   On: 01/10/2015 13:57   Dg Elbow 2 Views Left  01/20/2015   CLINICAL DATA:  Initial encounter for posterior left elbow pain after fall. On blood thinners.  EXAM: LEFT ELBOW - 2 VIEW  COMPARISON:  None.  FINDINGS: AP and lateral views. Soft tissue injury superficial to the olecranon and proximal humerus. No acute fracture or dislocation. No joint effusion. Mild osteoarthritis involves the coronoid process of the ulna.  IMPRESSION: Soft tissue injury only.   Electronically Signed   By: Abigail Miyamoto M.D.   On: 01/20/2015 11:48   Ct Head Wo Contrast  01/10/2015   CLINICAL DATA:  Recurrent falls.  Generalized weakness.  EXAM: CT HEAD WITHOUT CONTRAST  TECHNIQUE: Contiguous axial images were obtained from the base of the skull through the vertex without intravenous contrast.  COMPARISON:  Head CT scan 11/03/2014 and 10/09/2014  FINDINGS: Mild atrophy is identified. There is no evidence of acute intracranial abnormality including hemorrhage, infarct, mass lesion, mass effect, midline shift or abnormal extra-axial fluid collection. No hydrocephalus or pneumocephalus. The calvarium is intact. Carotid atherosclerosis is noted.  IMPRESSION: No acute abnormality.  Mild atrophy.  Atherosclerosis.   Electronically Signed   By: Inge Rise M.D.   On: 01/10/2015 17:47   US Abdomen Complete  01/23/2015   CLINICAL DATA:   Ascites  EXAM: ULTRASOUND ABDOMEN COMPLETE  COMPARISON:  01/05/2015 paracentesis, abdominal ultrasound 02/14/2014  FINDINGS: Gallbladder: Sludge noted within the gallbladder neck with gallbladder wall thickening of 5 mm but no sonographic Murphy sign. A small amount of pericholecystic fluid is identified in the setting of moderate ascites.  Common  bile duct: Diameter: 5 mm  Liver: Echogenic with coarse and nodular contour but no focal abnormality. No intrahepatic ductal dilatation  IVC: No abnormality visualized.  Pancreas: Obscured by bowel gas  Spleen: Size and appearance within normal limits.  Right Kidney: Length: 10.4 cm. Echogenicity within normal limits. No mass or hydronephrosis visualized.  Left Kidney: Length: 10.5 cm. Echogenicity within normal limits. No mass or hydronephrosis visualized.  Abdominal aorta: No aneurysm visualized.  Other findings: Moderate ascites  IMPRESSION: Moderate ascites.  Echogenic liver with nodular contour most compatible with cirrhosis.  Gallbladder sludge with gallbladder wall thickening which itself may be due to cirrhosis and/or hypoproteinemia but could also indicate cholecystitis in the appropriate clinical context.   Electronically Signed   By: Conchita Paris M.D.   On: 01/23/2015 17:11   US Paracentesis  01/05/2015   INDICATION: CHF, recurrent ascites. Request is made for therapeutic paracentesis.  EXAM: ULTRASOUND-GUIDED THERAPEUTIC PARACENTESIS  COMPARISON:  Prior paracentesis on  11/28/2014  MEDICATIONS: None.  COMPLICATIONS: None immediate  TECHNIQUE: Informed written consent was obtained from the patient after a discussion of the risks, benefits and alternatives to treatment. A timeout was performed prior to the initiation of the procedure.  Initial ultrasound scanning demonstrates a moderate amount of ascites within the right lower abdominal quadrant. The right lower abdomen was prepped and draped in the usual sterile fashion. 1% lidocaine was used for local  anesthesia. Under direct ultrasound guidance, a 19 gauge, 10-cm, Yueh catheter was introduced. An ultrasound image was saved for documentation purposed. The paracentesis was performed. The catheter was removed and a dressing was applied. The patient tolerated the procedure well without immediate post procedural complication.  FINDINGS: A total of approximately 2.7 liters of yellow fluid was removed.  IMPRESSION: Successful ultrasound-guided therapeutic paracentesis yielding 2.7 liters of peritoneal fluid.  Read by: Rowe Bora, PA-C   Electronically Signed   By: Aletta Edouard M.D.   On: 01/05/2015 15:24    Oren Binet, MD  [facility] Pager:[phone]  If 7PM-7AM, please contact night-coverage [email or web address] Password [email or web address] 01/26/2015, 12:21 PM   LOS: 5 days

## 2015-01-26 NOTE — Consult Note (Signed)
   [facility] CM Inpatient Consult   01/26/2015  SARAH ZERBY Jul 10, 1935 [id number] Came by to follow up referral for community care management with the patient but patient off of the unit for a procedure.  If patient is still in the hospital on Tuesday,  will plan to follow up at that time.  For questions, please contact: Natividad Brood, RN BSN [facility] Liaison  [phone] business mobile phone

## 2015-01-27 DIAGNOSIS — R188 Other ascites: Secondary | ICD-10-CM

## 2015-01-27 LAB — BASIC METABOLIC PANEL
Anion gap: 9 (ref 5–15)
BUN: 34 mg/dL — AB (ref 6–20)
CHLORIDE: 92 mmol/L — AB (ref 101–111)
CO2: 27 mmol/L (ref 22–32)
Calcium: 8.4 mg/dL — ABNORMAL LOW (ref 8.9–10.3)
Creatinine, Ser: 1.64 mg/dL — ABNORMAL HIGH (ref 0.61–1.24)
GFR calc Af Amer: 45 mL/min — ABNORMAL LOW (ref >60)
GFR calc non Af Amer: 39 mL/min — ABNORMAL LOW (ref >60)
GLUCOSE: 132 mg/dL — AB (ref 65–99)
POTASSIUM: 4.6 mmol/L (ref 3.5–5.1)
Sodium: 128 mmol/L — ABNORMAL LOW (ref 135–145)

## 2015-01-27 LAB — GLUCOSE, CAPILLARY
GLUCOSE-CAPILLARY: 132 mg/dL — AB (ref 65–99)
Glucose-Capillary: 172 mg/dL — ABNORMAL HIGH (ref 65–99)
Glucose-Capillary: 211 mg/dL — ABNORMAL HIGH (ref 65–99)
Glucose-Capillary: 171 mg/dL — ABNORMAL HIGH (ref 65–99)

## 2015-01-27 NOTE — Progress Notes (Signed)
duplicate

## 2015-01-27 NOTE — Progress Notes (Signed)
Patient Name: [name] Date of Encounter: 01/27/2015  Active Problems:   Essential hypertension   Chronic systolic CHF (congestive heart failure), NYHA class 4   CKD (chronic kidney disease) stage 3, GFR 30-59 ml/min   Chronic hyponatremia   Diabetes mellitus type 2, controlled   Dehydration   Hyponatremia   Weakness generalized   Hypothyroidism   GERD without esophagitis   HLD (hyperlipidemia)   Dehydration with hyponatremia   Faintness   Advanced directives, counseling/discussion   Palliative care encounter   Length of Stay: 6  SUBJECTIVE  Feels generally well, breathing comfortably. Had 2.5 L paracentesis yesterday, not recorded in his in/out. Drastic reduction in weight.  CURRENT MEDS . amiodarone  100 mg Oral Daily  . feeding supplement (ENSURE ENLIVE)  237 mL Oral BID BM  . folic acid  1 mg Oral Daily  . guaiFENesin  600 mg Oral BID  . heparin  5,000 Units Subcutaneous 3 times per day  . insulin aspart  0-9 Units Subcutaneous TID WC  . insulin glargine  7 Units Subcutaneous Q2200  . levothyroxine  88 mcg Oral QAC breakfast  . magnesium oxide  400 mg Oral TID  . mometasone-formoterol  2 puff Inhalation BID  . pantoprazole  40 mg Oral Daily  . pravastatin  80 mg Oral QHS  . sodium chloride  3 mL Intravenous Q12H  . tiotropium  18 mcg Inhalation Daily  . tolvaptan  15 mg Oral Once  . torsemide  20 mg Oral Daily    OBJECTIVE   Intake/Output Summary (Last 24 hours) at 01/27/15 1231 Last data filed at 01/27/15 1019  Gross per 24 hour  Intake    730 ml  Output   1050 ml  Net   -320 ml   Filed Weights   01/25/15 0450 01/26/15 0556 01/27/15 0100  Weight: 137 lb 2 oz (62.2 kg) 137 lb 9.1 oz (62.4 kg) 129 lb 13.6 oz (58.9 kg)    PHYSICAL EXAM Filed Vitals:   01/27/15 0100 01/27/15 0529 01/27/15 0628 01/27/15 0725  BP:  117/41 108/33   Pulse:  70 69   Temp:  98.4 F (36.9 C)    TempSrc:  Oral    Resp:  18    Height:      Weight: 129 lb 13.6  oz (58.9 kg)     SpO2:  99%  98%   General: Alert, oriented x3, no distress, cachectic and frail appearing Head: no evidence of trauma, PERRL, EOMI, no exophtalmos or lid lag, no myxedema, no xanthelasma; normal ears, nose and oropharynx Neck: 7-8 cm jugular venous pulsations and no hepatojugular reflux; brisk carotid pulses without delay and no carotid bruits Chest: clear to auscultation, no signs of consolidation by percussion or palpation, normal fremitus, symmetrical and full respiratory excursions Cardiovascular: normal position and quality of the apical impulse, regular rhythm, normal first and second heart sounds, no rubs or gallops, no murmur Abdomen: soft, mild distention, no masses by palpation, no abnormal pulsatility or arterial bruits, normal bowel sounds, no hepatosplenomegaly Extremities: no clubbing, cyanosis or edema; 2+ radial, ulnar and brachial pulses bilaterally; 2+ right femoral, posterior tibial and dorsalis pedis pulses; 2+ left femoral, posterior tibial and dorsalis pedis pulses; no subclavian or femoral bruits Neurological: grossly nonfocal  LABS  CBC  Recent Labs  01/25/15 0629  WBC 14.5*  NEUTROABS 10.1*  HGB 10.3*  HCT 30.3*  MCV 84.9  PLT 638   Basic Metabolic Panel  Recent Labs  01/26/15 0621 01/27/15 0445  NA 124* 128*  K 4.9 4.6  CL 93* 92*  CO2 25 27  GLUCOSE 128* 132*  BUN 28* 34*  CREATININE 1.49* 1.64*  CALCIUM 8.3* 8.4*   Liver Function Tests  Recent Labs  01/26/15 0621  AST 53*  ALT 23  ALKPHOS 105  BILITOT 1.7*  PROT 5.5*  ALBUMIN 1.9*    Radiology Studies Imaging results have been reviewed and US Paracentesis  01/26/2015   INDICATION: Ascites  EXAM: ULTRASOUND-GUIDED PARACENTESIS  COMPARISON:  Previous para  MEDICATIONS: 10 cc 1% lidocaine  COMPLICATIONS: None immediate  TECHNIQUE: Informed written consent was obtained from the patient after a discussion of the risks, benefits and alternatives to treatment. A timeout was  performed prior to the initiation of the procedure.  Initial ultrasound scanning demonstrates a large amount of ascites within the right lower abdominal quadrant. The right lower abdomen was prepped and draped in the usual sterile fashion. 1% lidocaine with epinephrine was used for local anesthesia. Under direct ultrasound guidance, a 19 gauge, 7-cm, Yueh catheter was introduced. An ultrasound image was saved for documentation purposed. The paracentesis was performed. The catheter was removed and a dressing was applied. The patient tolerated the procedure well without immediate post procedural complication.  FINDINGS: A total of approximately 2.5 liters of yellow fluid was removed. Samples were sent to the laboratory as requested by the clinical team.  IMPRESSION: Successful ultrasound-guided paracentesis yielding 2.5 liters of peritoneal fluid.  Read by:  Lavonia Drafts [facility]   Electronically Signed   By: Jacqulynn Cadet M.D.   On: 01/26/2015 14:49    TELE NSR   ASSESSMENT AND PLAN  1. Chronic systolic CHF: Ischemic cardiomyopathy. Has [employer] ICD. ECHO 7/16 EF 25-30%. NYHA III symptoms. Marked weight reduction with paracentesis Gradual improvement in hyponatremia.  Not on RAAS inhibition or beta blockers due to low BP 2. A fib/Aflutter: Maintaining SR on amiodarone. No anticoagulation with fall risk.  3. CKD: Stable III.  - 1.64, was 1.49 yesterday today. Baseline of 1.7-2.0 over past several months. Hold diuretics x 24h 4. CAD: s/p CABG.  5. COPD 6. H/o VT:  - Has an ICD and is now stable on amiodarone.  7. Hyponatremia  -Samsca - Fluid restrict to 1500 cc.  8. Fall/orthostatic hypotension: Off BP active meds.  - No further syncope 9. 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 S/P paracentesis   Sanda Klein, MD, [facility] [phone] office [phone] pager 01/27/2015 12:31 PM

## 2015-01-27 NOTE — Progress Notes (Addendum)
PATIENT DETAILS Name: [name] Age: 79 y.o. Sex: male Date of Birth: 11-Dec-1935 Admit Date: 01/20/2015 Admitting Physician No admitting provider for patient encounter. TMH:[id number],LGXQJJ H, MD  Brief Narrative: 79 year old male with history of chronic systolic heart failure-admitted for evaluation of a syncopal episode. Patient was recently hospitalized and discharged home, on discharge he was told to hold diuretics until he reaches his dry weight of 137 pounds, however patienhe continued to take diuretic therapy at home. On further evaluation, he was found to be orthostatic and hyponatremia, he was admitted started on gentle hydration.  He was managed with supportive measures, once he reached his dry weight of 137 pounds, he was given Samsca and Demadex. He also underwent paracentesis removing 2.5 L of fluid on 9/2 for presumed cardiac cirrhosis. Unfortunately on 9/3, he has redeveloped orthostatic hypotension. Plans are to continue with supportive measures. Cardiology continues to follow.  Subjective: No SOB/leg edema-feels weak. No other complaints  Assessment/Plan: Active Problems: Syncope:likely due to dehydration/orthostatics. Unfortunately after his most recent hospital discharge,patient continued to take diuretics, inspite of being told to hold till he reached dry weight of 137 lbs. Cardiology following-very brittle hemodynamics.  Hyponatremia:secondary to CHF-euvolemic on exam-received Samsca x 2-Na upto 128 today. Given significant decrease in weight (had paracentesison 9/2) and orthostatic vitals this am-suspect will need to hold of on Samsca and Demadex today. Cards following  Chronic systolic CHF (congestive heart failure), NYHA class 4:last EF 25-30%-cards following-very brittle hemodynamics-gets orthostatic with aggressive diuretics-cards following-recommendations are to hold off on diuretics today  Asictes:from cardiac cirrhoses-underwent paracentesis  with removal of 2.2 L of fluid on 9/2  Mildly elevated LFT's:appears chronic-suspect from hepatin congestion-follow  Diabetes mellitus type 2, controlled: CBG's controlled- continue lantus 7 units and SSI  CKD (chronic kidney disease) stage 3, GFR 30-59 ml/min:creatinine close to usual baseline, follow  Hypothyroidism:continue synthroid. TSH normal.  GERD without esophagitis: continue PPI  HLD (hyperlipidemia): continue statins  Hx of A. Fib: sinus rhythm, continue amiodarone, reviewed prior cards notes-not anticoagulated due to fall risk and h/o bleeding from AVM's  GERD without esophagitis: continue PPI  Weakness generalized: due to electrolytes abnormalities and deconditioning . PT evaluation completed-HHPT on discharge  History ventricular tachycardia-AICD (automatic cardioverter/defibrillator) present  Failure to thrive synd:patient has been functionally declining over the past few months-has now developed severe hyponatremia/orthostatics/cardiac cachexia. Very poor overall prognoses- Palliative care consulted-remains a full code, patient/family aware of poor prognoses-and want to continue to aggressive care.   Disposition: Remain inpatient-HHPT in next 1-2 days  Antimicrobial agents  See below  Anti-infectives    None      DVT Prophylaxis: Prophylactic Heparin   Code Status: Full code  Family Communication None at bedside  Procedures: None  CONSULTS:  None  Time spent 20 minutes-Greater than 50% of this time was spent in counseling, explanation of diagnosis, planning of further management, and coordination of care.  MEDICATIONS: Scheduled Meds: . amiodarone  100 mg Oral Daily  . feeding supplement (ENSURE ENLIVE)  237 mL Oral BID BM  . folic acid  1 mg Oral Daily  . guaiFENesin  600 mg Oral BID  . heparin  5,000 Units Subcutaneous 3 times per day  . insulin aspart  0-9 Units Subcutaneous TID WC  . insulin glargine  7 Units Subcutaneous Q2200  .  levothyroxine  88 mcg Oral QAC breakfast  . magnesium oxide  400 mg Oral TID  .  mometasone-formoterol  2 puff Inhalation BID  . pantoprazole  40 mg Oral Daily  . pravastatin  80 mg Oral QHS  . sodium chloride  3 mL Intravenous Q12H  . tiotropium  18 mcg Inhalation Daily  . tolvaptan  15 mg Oral Once  . torsemide  20 mg Oral Daily   Continuous Infusions:   PRN Meds:.acetaminophen **OR** acetaminophen, ondansetron **OR** ondansetron (ZOFRAN) IV    PHYSICAL EXAM: Vital signs in last 24 hours: Filed Vitals:   01/27/15 0100 01/27/15 0529 01/27/15 0628 01/27/15 0725  BP:  117/41 108/33   Pulse:  70 69   Temp:  98.4 F (36.9 C)    TempSrc:  Oral    Resp:  18    Height:      Weight: 58.9 kg (129 lb 13.6 oz)     SpO2:  99%  98%    Weight change: -3.5 kg (-7 lb 11.5 oz) Filed Weights   01/25/15 0450 01/26/15 0556 01/27/15 0100  Weight: 62.2 kg (137 lb 2 oz) 62.4 kg (137 lb 9.1 oz) 58.9 kg (129 lb 13.6 oz)   Body mass index is 20.33 kg/(m^2).   Gen Exam: Awake and alert with clear speech-looks chronically sick looking Neck: Supple, No JVD.   Chest: B/L Clear.   CVS: S1 S2 Regular, no murmurs.  Abdomen: soft, BS +, non tender, mild  distention-+dullness at flanks on percussion-not tense Extremities: no edema, lower extremities warm to touch.Ted hose in place Neurologic: Non Focal.   Skin: No Rash.   Wounds: N/A.   Intake/Output from previous day:  Intake/Output Summary (Last 24 hours) at 01/27/15 1228 Last data filed at 01/27/15 1019  Gross per 24 hour  Intake    730 ml  Output   1050 ml  Net   -320 ml     LAB RESULTS: CBC  Recent Labs Lab 01/20/15 1658 01/21/15 0545 01/23/15 0728 01/25/15 0629  WBC 12.9* 11.1* 13.2* 14.5*  HGB 10.6* 9.4* 9.3* 10.3*  HCT 30.1* 27.3* 27.0* 30.3*  PLT 131* 137* 154 158  MCV 83.8 83.2 83.3 84.9  MCH 29.5 28.7 28.7 28.9  MCHC 35.2 34.4 34.4 34.0  RDW 19.0* 19.2* 19.0* 19.2*  LYMPHSABS  --   --   --  1.6  MONOABS  --   --    --  2.7*  EOSABS  --   --   --  0.1  BASOSABS  --   --   --  0.0    Chemistries   Recent Labs Lab 01/23/15 0728 01/24/15 0640 01/25/15 0629 01/26/15 0621 01/27/15 0445  NA 122* 122* 122* 124* 128*  K 4.8 5.0 5.2* 4.9 4.6  CL 91* 89* 89* 93* 92*  CO2 23 24 24 25 27   GLUCOSE 128* 125* 120* 128* 132*  BUN 24* 24* 26* 28* 34*  CREATININE 1.54* 1.36* 1.41* 1.49* 1.64*  CALCIUM 8.5* 8.4* 8.4* 8.3* 8.4*  MG  --  2.0  --   --   --     CBG:  Recent Labs Lab 01/26/15 1226 01/26/15 1649 01/26/15 2129 01/27/15 0748 01/27/15 1159  GLUCAP 195* 175* 139* 132* 172*    GFR Estimated Creatinine Clearance: 31.4 mL/min (by C-G formula based on Cr of 1.64).  Coagulation profile No results for input(s): INR, PROTIME in the last 168 hours.  Cardiac Enzymes No results for input(s): CKMB, TROPONINI, MYOGLOBIN in the last 168 hours.  Invalid input(s): CK  Invalid input(s): POCBNP No results for input(s):  DDIMER in the last 72 hours. No results for input(s): HGBA1C in the last 72 hours. No results for input(s): CHOL, HDL, LDLCALC, TRIG, CHOLHDL, LDLDIRECT in the last 72 hours. No results for input(s): TSH, T4TOTAL, T3FREE, THYROIDAB in the last 72 hours.  Invalid input(s): FREET3 No results for input(s): VITAMINB12, FOLATE, FERRITIN, TIBC, IRON, RETICCTPCT in the last 72 hours. No results for input(s): LIPASE, AMYLASE in the last 72 hours.  Urine Studies No results for input(s): UHGB, CRYS in the last 72 hours.  Invalid input(s): UACOL, UAPR, USPG, UPH, UTP, UGL, UKET, UBIL, UNIT, UROB, ULEU, UEPI, UWBC, URBC, UBAC, CAST, UCOM, BILUA  MICROBIOLOGY: Recent Results (from the past 240 hour(s))  Urine culture     Status: None   Collection Time: 01/20/15 12:44 PM  Result Value Ref Range Status   Specimen Description URINE, RANDOM  Final   Special Requests NONE  Final   Culture MULTIPLE SPECIES PRESENT, SUGGEST RECOLLECTION  Final   Report Status 01/21/2015 FINAL  Final    Culture, body fluid-bottle     Status: None (Preliminary result)   Collection Time: 01/26/15  2:23 PM  Result Value Ref Range Status   Specimen Description FLUID PERITONEAL  Final   Special Requests NONE  Final   Culture NO GROWTH < 24 HOURS  Final   Report Status PENDING  Incomplete  Gram stain     Status: None   Collection Time: 01/26/15  2:23 PM  Result Value Ref Range Status   Specimen Description FLUID PERITONEAL  Final   Special Requests NONE  Final   Gram Stain   Final    RARE WBC PRESENT, PREDOMINANTLY MONONUCLEAR NO ORGANISMS SEEN    Report Status 01/26/2015 FINAL  Final    RADIOLOGY STUDIES/RESULTS: Dg Chest 2 View  01/20/2015   CLINICAL DATA:  Dizziness, fell 1 day ago, history hypertension, type II diabetes mellitus, coronary artery disease post CABG, chronic systolic heart failure, stage III chronic renal insufficiency  EXAM: CHEST  2 VIEW  COMPARISON:  01/10/2015  FINDINGS: LEFT subclavian AICD with leads projecting at RIGHT ventricle.  Upper normal heart size post CABG.  Mediastinal contours pulmonary vascularity normal.  Atherosclerotic calcification aorta.  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  Emphysematous and chronic bronchitic changes with chronic accentuation of interstitial markings in the mid to lower lungs.  No acute infiltrate, pleural effusion, or pneumothorax.  Bones demineralized.  IMPRESSION: Changes of COPD and likely prior asbestos exposure.  Post CABG and AICD.  No acute abnormalities.   Electronically Signed   By: Lavonia Dana M.D.   On: 01/20/2015 11:50   Dg Chest 2 View  01/10/2015   CLINICAL DATA:  Weakness, cough, congestion.  Shortness of breath.  EXAM: CHEST  2 VIEW  COMPARISON:  12/02/2014  FINDINGS: There is hyperinflation of the lungs compatible with COPD. Bilateral calcified pleural plaques again noted, unchanged. No confluent airspace opacities or effusions. No acute bony abnormality. Left AICD rib remains in  place, unchanged. Heart is normal size. Prior CABG.  IMPRESSION: Stable bilateral calcified pleural plaques. COPD. No active disease.   Electronically Signed   By: Rolm Baptise M.D.   On: 01/10/2015 13:57   Dg Elbow 2 Views Left  01/20/2015   CLINICAL DATA:  Initial encounter for posterior left elbow pain after fall. On blood thinners.  EXAM: LEFT ELBOW - 2 VIEW  COMPARISON:  None.  FINDINGS: AP and lateral views. Soft tissue injury superficial to the olecranon and  proximal humerus. No acute fracture or dislocation. No joint effusion. Mild osteoarthritis involves the coronoid process of the ulna.  IMPRESSION: Soft tissue injury only.   Electronically Signed   By: Abigail Miyamoto M.D.   On: 01/20/2015 11:48   Ct Head Wo Contrast  01/10/2015   CLINICAL DATA:  Recurrent falls.  Generalized weakness.  EXAM: CT HEAD WITHOUT CONTRAST  TECHNIQUE: Contiguous axial images were obtained from the base of the skull through the vertex without intravenous contrast.  COMPARISON:  Head CT scan 11/03/2014 and 10/09/2014  FINDINGS: Mild atrophy is identified. There is no evidence of acute intracranial abnormality including hemorrhage, infarct, mass lesion, mass effect, midline shift or abnormal extra-axial fluid collection. No hydrocephalus or pneumocephalus. The calvarium is intact. Carotid atherosclerosis is noted.  IMPRESSION: No acute abnormality.  Mild atrophy.  Atherosclerosis.   Electronically Signed   By: Inge Rise M.D.   On: 01/10/2015 17:47   US Abdomen Complete  01/23/2015   CLINICAL DATA:  Ascites  EXAM: ULTRASOUND ABDOMEN COMPLETE  COMPARISON:  01/05/2015 paracentesis, abdominal ultrasound 02/14/2014  FINDINGS: Gallbladder: Sludge noted within the gallbladder neck with gallbladder wall thickening of 5 mm but no sonographic Murphy sign. A small amount of pericholecystic fluid is identified in the setting of moderate ascites.  Common bile duct: Diameter: 5 mm  Liver: Echogenic with coarse and nodular contour  but no focal abnormality. No intrahepatic ductal dilatation  IVC: No abnormality visualized.  Pancreas: Obscured by bowel gas  Spleen: Size and appearance within normal limits.  Right Kidney: Length: 10.4 cm. Echogenicity within normal limits. No mass or hydronephrosis visualized.  Left Kidney: Length: 10.5 cm. Echogenicity within normal limits. No mass or hydronephrosis visualized.  Abdominal aorta: No aneurysm visualized.  Other findings: Moderate ascites  IMPRESSION: Moderate ascites.  Echogenic liver with nodular contour most compatible with cirrhosis.  Gallbladder sludge with gallbladder wall thickening which itself may be due to cirrhosis and/or hypoproteinemia but could also indicate cholecystitis in the appropriate clinical context.   Electronically Signed   By: Conchita Paris M.D.   On: 01/23/2015 17:11   US Paracentesis  01/26/2015   INDICATION: Ascites  EXAM: ULTRASOUND-GUIDED PARACENTESIS  COMPARISON:  Previous para  MEDICATIONS: 10 cc 1% lidocaine  COMPLICATIONS: None immediate  TECHNIQUE: Informed written consent was obtained from the patient after a discussion of the risks, benefits and alternatives to treatment. A timeout was performed prior to the initiation of the procedure.  Initial ultrasound scanning demonstrates a large amount of ascites within the right lower abdominal quadrant. The right lower abdomen was prepped and draped in the usual sterile fashion. 1% lidocaine with epinephrine was used for local anesthesia. Under direct ultrasound guidance, a 19 gauge, 7-cm, Yueh catheter was introduced. An ultrasound image was saved for documentation purposed. The paracentesis was performed. The catheter was removed and a dressing was applied. The patient tolerated the procedure well without immediate post procedural complication.  FINDINGS: A total of approximately 2.5 liters of yellow fluid was removed. Samples were sent to the laboratory as requested by the clinical team.  IMPRESSION: Successful  ultrasound-guided paracentesis yielding 2.5 liters of peritoneal fluid.  Read by:  Lavonia Drafts [facility]   Electronically Signed   By: Jacqulynn Cadet M.D.   On: 01/26/2015 14:49   US Paracentesis  01/05/2015   INDICATION: CHF, recurrent ascites. Request is made for therapeutic paracentesis.  EXAM: ULTRASOUND-GUIDED THERAPEUTIC PARACENTESIS  COMPARISON:  Prior paracentesis on  11/28/2014  MEDICATIONS: None.  COMPLICATIONS:  None immediate  TECHNIQUE: Informed written consent was obtained from the patient after a discussion of the risks, benefits and alternatives to treatment. A timeout was performed prior to the initiation of the procedure.  Initial ultrasound scanning demonstrates a moderate amount of ascites within the right lower abdominal quadrant. The right lower abdomen was prepped and draped in the usual sterile fashion. 1% lidocaine was used for local anesthesia. Under direct ultrasound guidance, a 19 gauge, 10-cm, Yueh catheter was introduced. An ultrasound image was saved for documentation purposed. The paracentesis was performed. The catheter was removed and a dressing was applied. The patient tolerated the procedure well without immediate post procedural complication.  FINDINGS: A total of approximately 2.7 liters of yellow fluid was removed.  IMPRESSION: Successful ultrasound-guided therapeutic paracentesis yielding 2.7 liters of peritoneal fluid.  Read by: Rowe Haddon, PA-C   Electronically Signed   By: Aletta Edouard M.D.   On: 01/05/2015 15:24    Oren Binet, MD  [facility] Pager:[phone]  If 7PM-7AM, please contact night-coverage [email or web address] Password [email or web address] 01/27/2015, 12:28 PM   LOS: 6 days

## 2015-01-28 DIAGNOSIS — I951 Orthostatic hypotension: Secondary | ICD-10-CM

## 2015-01-28 DIAGNOSIS — R55 Syncope and collapse: Secondary | ICD-10-CM

## 2015-01-28 LAB — GLUCOSE, CAPILLARY
GLUCOSE-CAPILLARY: 214 mg/dL — AB (ref 65–99)
GLUCOSE-CAPILLARY: 243 mg/dL — AB (ref 65–99)
Glucose-Capillary: 150 mg/dL — ABNORMAL HIGH (ref 65–99)
Glucose-Capillary: 208 mg/dL — ABNORMAL HIGH (ref 65–99)

## 2015-01-28 LAB — BASIC METABOLIC PANEL
Anion gap: 8 (ref 5–15)
BUN: 35 mg/dL — ABNORMAL HIGH (ref 6–20)
CALCIUM: 8.2 mg/dL — AB (ref 8.9–10.3)
CHLORIDE: 90 mmol/L — AB (ref 101–111)
CO2: 27 mmol/L (ref 22–32)
CREATININE: 1.66 mg/dL — AB (ref 0.61–1.24)
GFR calc non Af Amer: 38 mL/min — ABNORMAL LOW (ref >60)
GFR, EST AFRICAN AMERICAN: 44 mL/min — AB (ref >60)
GLUCOSE: 148 mg/dL — AB (ref 65–99)
Potassium: 4.5 mmol/L (ref 3.5–5.1)
Sodium: 125 mmol/L — ABNORMAL LOW (ref 135–145)

## 2015-01-28 NOTE — Progress Notes (Signed)
PROGRESS NOTE  [name] LKG:[id number] DOB: 1935/07/11 DOA: 01/20/2015 PCP: Tammi Sou, MD  Summary: 79 year old man PMH chronic systolic congestive heart failure, recently hospitalized and told to temporarily discontinue diuretic until reached dry weight of 137 pounds, however patient continued to take diuretic therapy. He was admitted 8/27 for syncopal event, found to be orthostatic and hyponatremic and started on IV fluids. 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 9/3 developed orthostatic hypotension.  Assessment/Plan: 1. Syncope, dehydration, orthostatic hypotension. Thought secondary to dehydration, orthostasis. Very brittle hemodynamics. 2. Hyponatremia, secondary CHF, chronically in the 120s. Treated with Samsca. 3. Chronic systolic congestive heart failure, LVEF 25-30 percent. NYHA class III, ischemic cardiomyopathy, Medtronic ICD. Brittle hemodynamics. He becomes orthostatic with diuretics. Diuretics held 9/3. Not on ACE inhibitor or beta blocker secondary to low blood pressure. 4. 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 Status post paracentesis 9/2 with removal of 2.2 L. 5. Diabetes mellitus type 2, controlled; remained stable. 6. CKD stage III, appears to be at baseline. 7. Mildly elevated LFTs likely secondary to hepatic congestion 8. History of atrial fibrillation, history of ventricular tachycardia, currently sinus rhythm. Continue amiodarone. Not anticoagulated secondary to fall risk and history of bleeding AVMs. 9. Failure to thrive. Functionally declining. Palliative care consulted. Full code, continue aggressive care.   Overall appears stable but still below dry weight.  Continue fluid restriction 1800 mL per day, stop diuretics for now. May need to restart in the near future.   Doubt will see much improvement in sodium level. He is asymptomatic.  Check  orthostatics in the morning and ambulate  May be able to go home next 1-2 days  Code Status: full code DVT prophylaxis: heparin Family Communication: none present, alert and understands plan Disposition Plan: home with [facility]  Murray Hodgkins, MD  [facility]  Pager [phone] If 7PM-7AM, please contact night-coverage at [email or web address], password [facility] 01/28/2015, 8:44 AM  LOS: 7 days   Consultants:  Cardiology  Procedures:  Paracentesis 9/2 removal 2.2 L  Antibiotics:    HPI/Subjective: Feels okay. Eating okay. No complaints.  Objective: Filed Vitals:   01/27/15 2128 01/28/15 0500 01/28/15 0601 01/28/15 0748  BP:   108/45   Pulse:   75   Temp:   99.4 F (37.4 C)   TempSrc:   Oral   Resp:   18   Height:      Weight:  59.5 kg (131 lb 2.8 oz)    SpO2: 94%  100% 96%    Intake/Output Summary (Last 24 hours) at 01/28/15 0844 Last data filed at 01/28/15 0500  Gross per 24 hour  Intake    250 ml  Output   1150 ml  Net   -900 ml     Filed Weights   01/26/15 0556 01/27/15 0100 01/28/15 0500  Weight: 62.4 kg (137 lb 9.1 oz) 58.9 kg (129 lb 13.6 oz) 59.5 kg (131 lb 2.8 oz)    Exam:     Afebrile, vital signs stable General:  Appears calm and comfortable Cardiovascular: RRR, no m/r/g. No LE edema. Telemetry: SR, no arrhythmias  Respiratory: CTA bilaterally, no w/r/r. Normal respiratory effort. Abdomen: soft, distended, nontender Psychiatric: grossly normal mood and affect, speech fluent and appropriate  New data reviewed:  Weight up to 131, still below dry weight  Urine output 1150  Blood sugars stable  BMP, sodium 125, slightly lower; creatinine 1.66, near baseline  Pending data:  Peritoneal fluid culture pending  Scheduled Meds: . amiodarone  100 mg Oral Daily  . feeding supplement (ENSURE ENLIVE)  237 mL Oral BID BM  . folic acid  1 mg Oral Daily  . guaiFENesin  600 mg Oral BID  . heparin  5,000 Units Subcutaneous 3 times per day  . insulin  aspart  0-9 Units Subcutaneous TID WC  . insulin glargine  7 Units Subcutaneous Q2200  . levothyroxine  88 mcg Oral QAC breakfast  . magnesium oxide  400 mg Oral TID  . mometasone-formoterol  2 puff Inhalation BID  . pantoprazole  40 mg Oral Daily  . pravastatin  80 mg Oral QHS  . sodium chloride  3 mL Intravenous Q12H  . tiotropium  18 mcg Inhalation Daily  . tolvaptan  15 mg Oral Once  . torsemide  20 mg Oral Daily   Continuous Infusions:   Principal Problem:   Syncope Active Problems:   Essential hypertension   Chronic systolic CHF (congestive heart failure), NYHA class 4   CKD (chronic kidney disease) stage 3, GFR 30-59 ml/min   Chronic hyponatremia   Diabetes mellitus type 2, controlled   Dehydration   Hyponatremia   Weakness generalized   Hypothyroidism   GERD without esophagitis   HLD (hyperlipidemia)   Dehydration with hyponatremia   Faintness   Advanced directives, counseling/discussion   Palliative care encounter   Orthostatic hypotension   Time spent 20 minutes

## 2015-01-28 NOTE — Progress Notes (Signed)
Patient Name: [name] Date of Encounter: 01/28/2015   Length of Stay: 7  SUBJECTIVE  Denies CP or dyspnea; abdomen less distended  CURRENT MEDS . amiodarone  100 mg Oral Daily  . feeding supplement (ENSURE ENLIVE)  237 mL Oral BID BM  . folic acid  1 mg Oral Daily  . guaiFENesin  600 mg Oral BID  . heparin  5,000 Units Subcutaneous 3 times per day  . insulin aspart  0-9 Units Subcutaneous TID WC  . insulin glargine  7 Units Subcutaneous Q2200  . levothyroxine  88 mcg Oral QAC breakfast  . magnesium oxide  400 mg Oral TID  . mometasone-formoterol  2 puff Inhalation BID  . pantoprazole  40 mg Oral Daily  . pravastatin  80 mg Oral QHS  . sodium chloride  3 mL Intravenous Q12H  . tiotropium  18 mcg Inhalation Daily    OBJECTIVE   Intake/Output Summary (Last 24 hours) at 01/28/15 1111 Last data filed at 01/28/15 1110  Gross per 24 hour  Intake    150 ml  Output   1250 ml  Net  -1100 ml   Filed Weights   01/26/15 0556 01/27/15 0100 01/28/15 0500  Weight: 137 lb 9.1 oz (62.4 kg) 129 lb 13.6 oz (58.9 kg) 131 lb 2.8 oz (59.5 kg)    PHYSICAL EXAM Filed Vitals:   01/27/15 2128 01/28/15 0500 01/28/15 0601 01/28/15 0748  BP:   108/45   Pulse:   75   Temp:   99.4 F (37.4 C)   TempSrc:   Oral   Resp:   18   Height:      Weight:  131 lb 2.8 oz (59.5 kg)    SpO2: 94%  100% 96%   General: Alert, oriented x3, no distress, cachectic and frail appearing Head: normal Neck: supple; JVP 8 cm Chest: diminished BS bases Cardiovascular: RRR Abdomen: soft, distended, no masses palpated Extremities: no edema Neurological: grossly nonfocal  LABS  Basic Metabolic Panel  Recent Labs  01/27/15 0445 01/28/15 0806  NA 128* 125*  K 4.6 4.5  CL 92* 90*  CO2 27 27  GLUCOSE 132* 148*  BUN 34* 35*  CREATININE 1.64* 1.66*  CALCIUM 8.4* 8.2*   Liver Function Tests  Recent Labs  01/26/15 0621  AST 53*  ALT 23  ALKPHOS 105  BILITOT 1.7*  PROT 5.5*  ALBUMIN  1.9*    Radiology Studies Imaging results have been reviewed and US Paracentesis  01/26/2015   INDICATION: Ascites  EXAM: ULTRASOUND-GUIDED PARACENTESIS  COMPARISON:  Previous para  MEDICATIONS: 10 cc 1% lidocaine  COMPLICATIONS: None immediate  TECHNIQUE: Informed written consent was obtained from the patient after a discussion of the risks, benefits and alternatives to treatment. A timeout was performed prior to the initiation of the procedure.  Initial ultrasound scanning demonstrates a large amount of ascites within the right lower abdominal quadrant. The right lower abdomen was prepped and draped in the usual sterile fashion. 1% lidocaine with epinephrine was used for local anesthesia. Under direct ultrasound guidance, a 19 gauge, 7-cm, Yueh catheter was introduced. An ultrasound image was saved for documentation purposed. The paracentesis was performed. The catheter was removed and a dressing was applied. The patient tolerated the procedure well without immediate post procedural complication.  FINDINGS: A total of approximately 2.5 liters of yellow fluid was removed. Samples were sent to the laboratory as requested by the clinical team.  IMPRESSION: Successful ultrasound-guided paracentesis yielding 2.5 liters of  peritoneal fluid.  Read by:  Lavonia Drafts [facility]   Electronically Signed   By: Jacqulynn Cadet M.D.   On: 01/26/2015 14:49    TELE NSR   ASSESSMENT AND PLAN  1. Chronic systolic CHF: Ischemic cardiomyopathy. Has Medtronic ICD. ECHO 7/16 EF 25-30%. NYHA III symptoms. Marked weight reduction with paracentesis; weight 131; Cr 1.66; will hold diuretics today; resume in AM. Not on RAAS inhibition or beta blockers due to low BP 2. A fib/Aflutter: Maintaining SR on amiodarone. No anticoagulation with fall risk.  3. CKD: Stable III.  - Hold diuretics another 24h; recheck BMET in AM 4. CAD: s/p CABG.  5. COPD 6. H/o VT:  - Has an ICD and is now stable on amiodarone.  7.  Hyponatremia  - Fluid restrict to 1500 cc.  8. Fall/orthostatic hypotension: Off BP active meds.  - Improved; No further syncope 9. 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 S/P paracentesis Possible DC in AM if stable on lower dose diuretics with close fu in CHF clinic  Kirk Ruths, MD, [place] 01/28/2015

## 2015-01-29 DIAGNOSIS — E119 Type 2 diabetes mellitus without complications: Secondary | ICD-10-CM

## 2015-01-29 DIAGNOSIS — R55 Syncope and collapse: Secondary | ICD-10-CM

## 2015-01-29 LAB — BASIC METABOLIC PANEL
Anion gap: 8 (ref 5–15)
BUN: 38 mg/dL — ABNORMAL HIGH (ref 6–20)
CHLORIDE: 89 mmol/L — AB (ref 101–111)
CO2: 28 mmol/L (ref 22–32)
CREATININE: 1.55 mg/dL — AB (ref 0.61–1.24)
Calcium: 8.3 mg/dL — ABNORMAL LOW (ref 8.9–10.3)
GFR calc non Af Amer: 41 mL/min — ABNORMAL LOW (ref >60)
GFR, EST AFRICAN AMERICAN: 48 mL/min — AB (ref >60)
Glucose, Bld: 175 mg/dL — ABNORMAL HIGH (ref 65–99)
POTASSIUM: 4.7 mmol/L (ref 3.5–5.1)
SODIUM: 125 mmol/L — AB (ref 135–145)

## 2015-01-29 LAB — GLUCOSE, CAPILLARY
GLUCOSE-CAPILLARY: 171 mg/dL — AB (ref 65–99)
GLUCOSE-CAPILLARY: 256 mg/dL — AB (ref 65–99)

## 2015-01-29 MED ORDER — TORSEMIDE 20 MG PO TABS: 20.0000 mg | ORAL_TABLET | Freq: Every day | ORAL | Status: DC

## 2015-01-29 NOTE — Progress Notes (Signed)
Physical Therapy Treatment Patient Details Name: [name] MRN: [id number] DOB: 1936-05-20 Today's Date: 01/29/2015    History of Present Illness Pt is a 79 y/o M admitted after episode of syncope, wife found him on the floor.  [name] PMH includes ventricular tachycardia, HTN, PVD, CAD, COPD, anxiety, DMII, chronic LBP.    PT Comments    Patient limited by fatigue this session. Required increased time for transitions. Patient required Min A for ambulation due to some imbalance with turns. Continue to recommend HHPT and 24/7 assistance at home. Recommend daily ambulation with staff to maintain mobility and activity tolerance.   Follow Up Recommendations  Home health PT;Supervision/Assistance - 24 hour     Equipment Recommendations  None recommended by PT    Recommendations for Other Services       Precautions / Restrictions Precautions Precautions: Fall    Mobility  Bed Mobility Overal bed mobility: Modified Independent                Transfers Overall transfer level: Needs assistance Equipment used: Rolling walker (2 wheeled)   Sit to Stand: Min guard         General transfer comment: increased time, good hand placement. INcreased effort with stand today  Ambulation/Gait Ambulation/Gait assistance: Min assist Ambulation Distance (Feet): 120 Feet Assistive device: Rolling walker (2 wheeled) Gait Pattern/deviations: Step-through pattern;Decreased stride length;Trunk flexed   Gait velocity interpretation: Below normal speed for age/gender General Gait Details: Min A with turns to ensure balance. Cues for posture and to look forward. patient with slight L side sway tending to bump RW on objects on the left.    Stairs            Wheelchair Mobility    Modified Rankin (Stroke Patients Only)       Balance                                    Cognition Arousal/Alertness: Awake/alert Behavior During Therapy: WFL for tasks  assessed/performed Overall Cognitive Status: Within Functional Limits for tasks assessed                      Exercises      General Comments        Pertinent Vitals/Pain Pain Assessment: No/denies pain    Home Living                      Prior Function            PT Goals (current goals can now be found in the care plan section) Progress towards PT goals: Progressing toward goals    Frequency  Min 3X/week    PT Plan Current plan remains appropriate    Co-evaluation             End of Session   Activity Tolerance: Patient limited by fatigue Patient left: in chair;with call bell/phone within reach;with chair alarm set     Time: 6837-2902 PT Time Calculation (min) (ACUTE ONLY): 25 min  Charges:  $Gait Training: 8-22 mins $Therapeutic Activity: 8-22 mins                    G Codes:      Jacqualyn Posey 01/29/2015, 9:10 AM 01/29/2015 Jacqualyn Posey PTA [phone] pager [phone] office

## 2015-01-29 NOTE — Care Management Important Message (Signed)
Important Message  Patient Details  Name: [name] MRN: [id number] Date of Birth: 05-15-1936   Medicare Important Message Given:  Yes-third notification given    Loann Quill 01/29/2015, 9:12 AM

## 2015-01-29 NOTE — Progress Notes (Signed)
  Patient Name: [name] Date of Encounter: 01/29/2015   Length of Stay: 8  SUBJECTIVE  Denies CP or dyspnea  CURRENT MEDS . amiodarone  100 mg Oral Daily  . feeding supplement (ENSURE ENLIVE)  237 mL Oral BID BM  . folic acid  1 mg Oral Daily  . guaiFENesin  600 mg Oral BID  . heparin  5,000 Units Subcutaneous 3 times per day  . insulin aspart  0-9 Units Subcutaneous TID WC  . insulin glargine  7 Units Subcutaneous Q2200  . levothyroxine  88 mcg Oral QAC breakfast  . magnesium oxide  400 mg Oral TID  . mometasone-formoterol  2 puff Inhalation BID  . pantoprazole  40 mg Oral Daily  . pravastatin  80 mg Oral QHS  . sodium chloride  3 mL Intravenous Q12H  . tiotropium  18 mcg Inhalation Daily    OBJECTIVE   Intake/Output Summary (Last 24 hours) at 01/29/15 0854 Last data filed at 01/28/15 1856  Gross per 24 hour  Intake    550 ml  Output    750 ml  Net   -200 ml   Filed Weights   01/27/15 0100 01/28/15 0500 01/29/15 0500  Weight: 129 lb 13.6 oz (58.9 kg) 131 lb 2.8 oz (59.5 kg) 132 lb 3.2 oz (59.966 kg)    PHYSICAL EXAM Filed Vitals:   01/29/15 0106 01/29/15 0500 01/29/15 0517 01/29/15 0817  BP: 92/76  105/49   Pulse: 119  73   Temp:   99.2 F (37.3 C)   TempSrc:   Oral   Resp:   18   Height:      Weight:  132 lb 3.2 oz (59.966 kg)    SpO2:   94% 92%   General: Alert, oriented x3, no distress, cachectic and frail appearing Head: normal Neck: supple; JVP 8 cm Chest: diminished BS bases Cardiovascular: RRR Abdomen: soft, distended, no masses palpated Extremities: no edema Neurological: grossly nonfocal  LABS  Basic Metabolic Panel  Recent Labs  01/28/15 0806 01/29/15 0515  NA 125* 125*  K 4.5 4.7  CL 90* 89*  CO2 27 28  GLUCOSE 148* 175*  BUN 35* 38*  CREATININE 1.66* 1.55*  CALCIUM 8.2* 8.3*   TELE NSR   ASSESSMENT AND PLAN  1. Chronic systolic CHF: Ischemic cardiomyopathy. Has Medtronic ICD. ECHO 7/16 EF 25-30%. NYHA III  symptoms. Marked weight reduction with paracentesis; weight 132; Cr 1.55; will hold diuretics today; resume in AM; would treat with demadex 20 mg daily; BMET in CHF clinic in one week. Not on RAAS inhibition or beta blockers due to low BP 2. A fib/Aflutter: Maintaining SR on amiodarone. No anticoagulation with fall risk.  3. CKD: Stable III.  - Hold diuretics another 24h as outlined above 4. CAD: s/p CABG.  5. COPD 6. H/o VT:  - Has an ICD and is now stable on amiodarone.  7. Hyponatremia  - Fluid restrict to 1500 cc.  8. Fall/orthostatic hypotension: Off BP active meds.  - Improved; No further syncope 9. 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 S/P paracentesis DC today (on diuretic regimen as outlined above) from a cardiac standpoint with close fu in CHF clinic  Kirk Ruths, MD, [place] 01/29/2015

## 2015-01-29 NOTE — Discharge Summary (Signed)
Physician Discharge Summary  [name] OHY:[id number] DOB: [birth year] DOA: 01/20/2015  PCP: Tammi Sou, MD  Admit date: 01/20/2015 Discharge date: 01/29/2015  Time spent: >35 minutes  Recommendations for Outpatient Follow-up:  F/u at Heart Failure clinic this week F/u with PCP in 1 week as needed   Discharge Diagnoses:  Principal Problem:   Syncope Active Problems:   Essential hypertension   Chronic systolic CHF (congestive heart failure), NYHA class 4   CKD (chronic kidney disease) stage 3, GFR 30-59 ml/min   Chronic hyponatremia   Diabetes mellitus type 2, controlled   Dehydration   Hyponatremia   Weakness generalized   Hypothyroidism   GERD without esophagitis   HLD (hyperlipidemia)   Dehydration with hyponatremia   Faintness   Advanced directives, counseling/discussion   Palliative care encounter   Orthostatic hypotension   Discharge Condition: stable   Diet recommendation: DM  Filed Weights   01/27/15 0100 01/28/15 0500 01/29/15 0500  Weight: 58.9 kg (129 lb 13.6 oz) 59.5 kg (131 lb 2.8 oz) 59.966 kg (132 lb 3.2 oz)    History of present illness:  79 year old man PMH chronic systolic congestive heart failure, recently hospitalized and told to temporarily discontinue diuretic until reached dry weight of 137 pounds, however patient continued to take diuretic therapy. He was admitted 8/27 for syncopal event, found to be orthostatic and hyponatremic and started on IV fluids. 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    Hospital Course:  1. Syncope, dehydration, orthostatic hypotension. Thought secondary to dehydration, orthostasis. Very brittle hemodynamics. 2. Hyponatremia, secondary CHF, chronically in the 120s. Treated with Samsca. 3. Chronic systolic congestive heart failure, LVEF 25-30 percent. NYHA class III, ischemic cardiomyopathy, Medtronic ICD. Brittle  hemodynamics. He becomes orthostatic with diuretics. - Diuretics to be restarted on 9/6 per cardiology recommendations. . Not on ACE inhibitor or beta blocker secondary to low blood pressure. F/u at HF clinic this week  4. 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 Status post paracentesis 9/2 with removal of 2.2 L. 5. Diabetes mellitus type 2, controlled; resume home regimen  6. CKD stage III, appears to be at baseline. 7. History of atrial fibrillation, history of ventricular tachycardia, currently sinus rhythm. Continue amiodarone. Not anticoagulated secondary to fall risk and history of bleeding AVMs.  Patient is being discharged in stable condition to f/u at HF clinic.  Prognosis is poor with advanced heart failure,liver cirrhosis ongoing organ dysfunction. May need outpatient palliative care follow up   Procedures:  paracentesis  (i.e. Studies not automatically included, echos, thoracentesis, etc; not x-rays)  Consultations:  Cardiology   Discharge Exam: Filed Vitals:   01/29/15 0942  BP: 103/42  Pulse: 77  Temp:   Resp:     General: alert. No distress  Cardiovascular: s1,s2 irregular  Respiratory: few crackles in LL  Discharge Instructions  Discharge Instructions    Diet - low sodium heart healthy    Complete by:  As directed      Discharge instructions    Complete by:  As directed   Please follow up with heart failure clinic this week     Increase activity slowly    Complete by:  As directed             Medication List    TAKE these medications        amiodarone 200 MG tablet  Commonly known as:  PACERONE  Take  0.5 tablets (100 mg total) by mouth daily.     Fluticasone-Salmeterol 500-50 MCG/DOSE Aepb  Commonly known as:  ADVAIR  Inhale 1 puff into the lungs 2 (two) times daily.     folic acid 1 MG tablet  Commonly known as:  FOLVITE  Take 1 tablet (1 mg total) by mouth daily.     guaiFENesin 600 MG 12 hr tablet  Commonly known as:   MUCINEX  Take 1 tablet (600 mg total) by mouth 2 (two) times daily.     Insulin Glargine 100 UNIT/ML Solostar Pen  Commonly known as:  LANTUS SOLOSTAR  10 units SQ qhs     insulin lispro 100 UNIT/ML KiwkPen  Commonly known as:  HUMALOG KWIKPEN  Take 5 units in the morning, 5 units at noon, --if blood sugar over 100 and you eat > 50% of meals     levothyroxine 88 MCG tablet  Commonly known as:  SYNTHROID, LEVOTHROID  Take 1 tablet (88 mcg total) by mouth daily.     magnesium oxide 400 (241.3 MG) MG tablet  Commonly known as:  MAG-OX  Take 1 tablet (400 mg total) by mouth 3 (three) times daily.     pantoprazole 40 MG tablet  Commonly known as:  PROTONIX  Take 1 tablet (40 mg total) by mouth daily.     potassium chloride SA 20 MEQ tablet  Commonly known as:  KLOR-CON M20  Take 1 tablet (20 mEq total) by mouth daily.     pravastatin 80 MG tablet  Commonly known as:  PRAVACHOL  Take 1 tablet (80 mg total) by mouth at bedtime.     tiotropium 18 MCG inhalation capsule  Commonly known as:  SPIRIVA HANDIHALER  PLACE ONE CAPSULE INTO INHALER AND  INHALE  DAILY     torsemide 20 MG tablet  Commonly known as:  DEMADEX  Take 1 tablet (20 mg total) by mouth daily.  Start taking on:  01/30/2015       Allergies  Allergen Reactions  . Niacin Itching    Niaspan        Follow-up Information    Follow up with Glori Bickers, MD.   Specialty:  Cardiology   Why:  Heart failure clinic will call you for follow up   Contact information:   [place] [address] [phone]       Follow up with Tammi Sou, MD In 1 week.   Specialty:  Family Medicine   Contact information:   [id number] [place] [address] [phone]        The results of significant diagnostics from this hospitalization (including imaging, microbiology, ancillary and laboratory) are listed below for reference.    Significant Diagnostic Studies: Dg Chest  2 View  01/20/2015   CLINICAL DATA:  Dizziness, fell 1 day ago, history hypertension, type II diabetes mellitus, coronary artery disease post CABG, chronic systolic heart failure, stage III chronic renal insufficiency  EXAM: CHEST  2 VIEW  COMPARISON:  01/10/2015  FINDINGS: LEFT subclavian AICD with leads projecting at RIGHT ventricle.  Upper normal heart size post CABG.  Mediastinal contours pulmonary vascularity normal.  Atherosclerotic calcification aorta.  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  Emphysematous and chronic bronchitic changes with chronic accentuation of interstitial markings in the mid to lower lungs.  No acute infiltrate, pleural effusion, or pneumothorax.  Bones demineralized.  IMPRESSION: Changes of COPD and likely prior asbestos exposure.  Post CABG and AICD.  No acute abnormalities.   Electronically Signed   By: Lavonia Dana M.D.   On: 01/20/2015 11:50   Dg Chest 2 View  01/10/2015   CLINICAL DATA:  Weakness, cough, congestion.  Shortness of breath.  EXAM: CHEST  2 VIEW  COMPARISON:  12/02/2014  FINDINGS: There is hyperinflation of the lungs compatible with COPD. Bilateral calcified pleural plaques again noted, unchanged. No confluent airspace opacities or effusions. No acute bony abnormality. Left AICD rib remains in place, unchanged. Heart is normal size. Prior CABG.  IMPRESSION: Stable bilateral calcified pleural plaques. COPD. No active disease.   Electronically Signed   By: Rolm Baptise M.D.   On: 01/10/2015 13:57   Dg Elbow 2 Views Left  01/20/2015   CLINICAL DATA:  Initial encounter for posterior left elbow pain after fall. On blood thinners.  EXAM: LEFT ELBOW - 2 VIEW  COMPARISON:  None.  FINDINGS: AP and lateral views. Soft tissue injury superficial to the olecranon and proximal humerus. No acute fracture or dislocation. No joint effusion. Mild osteoarthritis involves the coronoid process of the ulna.  IMPRESSION: Soft tissue injury only.    Electronically Signed   By: Abigail Miyamoto M.D.   On: 01/20/2015 11:48   Ct Head Wo Contrast  01/10/2015   CLINICAL DATA:  Recurrent falls.  Generalized weakness.  EXAM: CT HEAD WITHOUT CONTRAST  TECHNIQUE: Contiguous axial images were obtained from the base of the skull through the vertex without intravenous contrast.  COMPARISON:  Head CT scan 11/03/2014 and 10/09/2014  FINDINGS: Mild atrophy is identified. There is no evidence of acute intracranial abnormality including hemorrhage, infarct, mass lesion, mass effect, midline shift or abnormal extra-axial fluid collection. No hydrocephalus or pneumocephalus. The calvarium is intact. Carotid atherosclerosis is noted.  IMPRESSION: No acute abnormality.  Mild atrophy.  Atherosclerosis.   Electronically Signed   By: Inge Rise M.D.   On: 01/10/2015 17:47   US Abdomen Complete  01/23/2015   CLINICAL DATA:  Ascites  EXAM: ULTRASOUND ABDOMEN COMPLETE  COMPARISON:  01/05/2015 paracentesis, abdominal ultrasound 02/14/2014  FINDINGS: Gallbladder: Sludge noted within the gallbladder neck with gallbladder wall thickening of 5 mm but no sonographic Murphy sign. A small amount of pericholecystic fluid is identified in the setting of moderate ascites.  Common bile duct: Diameter: 5 mm  Liver: Echogenic with coarse and nodular contour but no focal abnormality. No intrahepatic ductal dilatation  IVC: No abnormality visualized.  Pancreas: Obscured by bowel gas  Spleen: Size and appearance within normal limits.  Right Kidney: Length: 10.4 cm. Echogenicity within normal limits. No mass or hydronephrosis visualized.  Left Kidney: Length: 10.5 cm. Echogenicity within normal limits. No mass or hydronephrosis visualized.  Abdominal aorta: No aneurysm visualized.  Other findings: Moderate ascites  IMPRESSION: Moderate ascites.  Echogenic liver with nodular contour most compatible with cirrhosis.  Gallbladder sludge with gallbladder wall thickening which itself may be due to  cirrhosis and/or hypoproteinemia but could also indicate cholecystitis in the appropriate clinical context.   Electronically Signed   By: Conchita Paris M.D.   On: 01/23/2015 17:11   US Paracentesis  01/26/2015   INDICATION: Ascites  EXAM: ULTRASOUND-GUIDED PARACENTESIS  COMPARISON:  Previous para  MEDICATIONS: 10 cc 1% lidocaine  COMPLICATIONS: None immediate  TECHNIQUE: Informed written consent was obtained from the patient after a discussion of the risks, benefits and alternatives to treatment. A timeout was performed prior to the initiation of the procedure.  Initial ultrasound scanning demonstrates  a large amount of ascites within the right lower abdominal quadrant. The right lower abdomen was prepped and draped in the usual sterile fashion. 1% lidocaine with epinephrine was used for local anesthesia. Under direct ultrasound guidance, a 19 gauge, 7-cm, Yueh catheter was introduced. An ultrasound image was saved for documentation purposed. The paracentesis was performed. The catheter was removed and a dressing was applied. The patient tolerated the procedure well without immediate post procedural complication.  FINDINGS: A total of approximately 2.5 liters of yellow fluid was removed. Samples were sent to the laboratory as requested by the clinical team.  IMPRESSION: Successful ultrasound-guided paracentesis yielding 2.5 liters of peritoneal fluid.  Read by:  Lavonia Drafts [facility]   Electronically Signed   By: Jacqulynn Cadet M.D.   On: 01/26/2015 14:49   US Paracentesis  01/05/2015   INDICATION: CHF, recurrent ascites. Request is made for therapeutic paracentesis.  EXAM: ULTRASOUND-GUIDED THERAPEUTIC PARACENTESIS  COMPARISON:  Prior paracentesis on  11/28/2014  MEDICATIONS: None.  COMPLICATIONS: None immediate  TECHNIQUE: Informed written consent was obtained from the patient after a discussion of the risks, benefits and alternatives to treatment. A timeout was performed prior to the initiation of the  procedure.  Initial ultrasound scanning demonstrates a moderate amount of ascites within the right lower abdominal quadrant. The right lower abdomen was prepped and draped in the usual sterile fashion. 1% lidocaine was used for local anesthesia. Under direct ultrasound guidance, a 19 gauge, 10-cm, Yueh catheter was introduced. An ultrasound image was saved for documentation purposed. The paracentesis was performed. The catheter was removed and a dressing was applied. The patient tolerated the procedure well without immediate post procedural complication.  FINDINGS: A total of approximately 2.7 liters of yellow fluid was removed.  IMPRESSION: Successful ultrasound-guided therapeutic paracentesis yielding 2.7 liters of peritoneal fluid.  Read by: Rowe Irby, PA-C   Electronically Signed   By: Aletta Edouard M.D.   On: 01/05/2015 15:24    Microbiology: Recent Results (from the past 240 hour(s))  Urine culture     Status: None   Collection Time: 01/20/15 12:44 PM  Result Value Ref Range Status   Specimen Description URINE, RANDOM  Final   Special Requests NONE  Final   Culture MULTIPLE SPECIES PRESENT, SUGGEST RECOLLECTION  Final   Report Status 01/21/2015 FINAL  Final  Culture, body fluid-bottle     Status: None (Preliminary result)   Collection Time: 01/26/15  2:23 PM  Result Value Ref Range Status   Specimen Description FLUID PERITONEAL  Final   Special Requests NONE  Final   Culture NO GROWTH 2 DAYS  Final   Report Status PENDING  Incomplete  Gram stain     Status: None   Collection Time: 01/26/15  2:23 PM  Result Value Ref Range Status   Specimen Description FLUID PERITONEAL  Final   Special Requests NONE  Final   Gram Stain   Final    RARE WBC PRESENT, PREDOMINANTLY MONONUCLEAR NO ORGANISMS SEEN    Report Status 01/26/2015 FINAL  Final     Labs: Basic Metabolic Panel:  Recent Labs Lab 01/24/15 0640 01/25/15 0629 01/26/15 0621 01/27/15 0445 01/28/15 0806 01/29/15 0515   NA 122* 122* 124* 128* 125* 125*  K 5.0 5.2* 4.9 4.6 4.5 4.7  CL 89* 89* 93* 92* 90* 89*  CO2 24 24 25 27 27 28   GLUCOSE 125* 120* 128* 132* 148* 175*  BUN 24* 26* 28* 34* 35* 38*  CREATININE 1.36* 1.41*  1.49* 1.64* 1.66* 1.55*  CALCIUM 8.4* 8.4* 8.3* 8.4* 8.2* 8.3*  MG 2.0  --   --   --   --   --    Liver Function Tests:  Recent Labs Lab 01/23/15 0728 01/26/15 0621  AST 55* 53*  ALT 24 23  ALKPHOS 91 105  BILITOT 1.6* 1.7*  PROT 5.7* 5.5*  ALBUMIN 2.1* 1.9*   No results for input(s): LIPASE, AMYLASE in the last 168 hours. No results for input(s): AMMONIA in the last 168 hours. CBC:  Recent Labs Lab 01/23/15 0728 01/25/15 0629  WBC 13.2* 14.5*  NEUTROABS  --  10.1*  HGB 9.3* 10.3*  HCT 27.0* 30.3*  MCV 83.3 84.9  PLT 154 158   Cardiac Enzymes: No results for input(s): CKTOTAL, CKMB, CKMBINDEX, TROPONINI in the last 168 hours. BNP: BNP (last 3 results)  Recent Labs  12/02/14 1800 01/10/15 1730 01/20/15 1033  BNP 262.1* 304.5* 161.6*    ProBNP (last 3 results)  Recent Labs  02/09/14 0945  PROBNP 2555.0*    CBG:  Recent Labs Lab 01/28/15 0806 01/28/15 1203 01/28/15 1633 01/28/15 2242 01/29/15 0845  GLUCAP 150* 214* 243* 208* 171*       Signed:  Rowe Clack N  [facility] 01/29/2015, 10:12 AM

## 2015-01-29 NOTE — Discharge Summary (Signed)
Nsg Discharge Note  Admit Date:  01/20/2015 Discharge date: 01/29/2015   [name] to be D/C'd Home per MD order.  AVS completed.  Copy for chart, and copy for patient signed, and dated. Patient/caregiver able to verbalize understanding.  Discharge Medication:   Medication List    TAKE these medications        amiodarone 200 MG tablet  Commonly known as:  PACERONE  Take 0.5 tablets (100 mg total) by mouth daily.     Fluticasone-Salmeterol 500-50 MCG/DOSE Aepb  Commonly known as:  ADVAIR  Inhale 1 puff into the lungs 2 (two) times daily.     folic acid 1 MG tablet  Commonly known as:  FOLVITE  Take 1 tablet (1 mg total) by mouth daily.     guaiFENesin 600 MG 12 hr tablet  Commonly known as:  MUCINEX  Take 1 tablet (600 mg total) by mouth 2 (two) times daily.     Insulin Glargine 100 UNIT/ML Solostar Pen  Commonly known as:  LANTUS SOLOSTAR  10 units SQ qhs     insulin lispro 100 UNIT/ML KiwkPen  Commonly known as:  HUMALOG KWIKPEN  Take 5 units in the morning, 5 units at noon, --if blood sugar over 100 and you eat > 50% of meals     levothyroxine 88 MCG tablet  Commonly known as:  SYNTHROID, LEVOTHROID  Take 1 tablet (88 mcg total) by mouth daily.     magnesium oxide 400 (241.3 MG) MG tablet  Commonly known as:  MAG-OX  Take 1 tablet (400 mg total) by mouth 3 (three) times daily.     pantoprazole 40 MG tablet  Commonly known as:  PROTONIX  Take 1 tablet (40 mg total) by mouth daily.     potassium chloride SA 20 MEQ tablet  Commonly known as:  KLOR-CON M20  Take 1 tablet (20 mEq total) by mouth daily.     pravastatin 80 MG tablet  Commonly known as:  PRAVACHOL  Take 1 tablet (80 mg total) by mouth at bedtime.     tiotropium 18 MCG inhalation capsule  Commonly known as:  SPIRIVA HANDIHALER  PLACE ONE CAPSULE INTO INHALER AND  INHALE  DAILY     torsemide 20 MG tablet  Commonly known as:  DEMADEX  Take 1 tablet (20 mg total) by mouth daily.  Start  taking on:  01/30/2015        Discharge Assessment: Filed Vitals:   01/29/15 0942  BP: 103/42  Pulse: 77  Temp:   Resp:    Skin clean, dry and intact without evidence of skin break down, no evidence of skin tears noted. IV catheter discontinued intact. Site without signs and symptoms of complications - no redness or edema noted at insertion site, patient denies c/o pain - only slight tenderness at site.  Dressing with slight pressure applied.  D/c Instructions-Education: Discharge instructions given to patient/family with verbalized understanding. D/c education completed with patient/family including follow up instructions, medication list, d/c activities limitations if indicated, with other d/c instructions as indicated by MD - patient able to verbalize understanding, all questions fully answered. Patient instructed to return to ED, call 911, or call MD for any changes in condition.  Patient escorted via [place], and D/C home via private auto.  Salley Slaughter, RN 01/29/2015 1:22 PM  Nsg Discharge Note  Admit Date:  01/20/2015 Discharge date: 01/29/2015   [name] to be D/C'd Home per MD order.  AVS completed.  Copy for chart, and copy for patient signed, and dated. Patient/caregiver able to verbalize understanding.  Discharge Medication:   Medication List    TAKE these medications        amiodarone 200 MG tablet  Commonly known as:  PACERONE  Take 0.5 tablets (100 mg total) by mouth daily.     Fluticasone-Salmeterol 500-50 MCG/DOSE Aepb  Commonly known as:  ADVAIR  Inhale 1 puff into the lungs 2 (two) times daily.     folic acid 1 MG tablet  Commonly known as:  FOLVITE  Take 1 tablet (1 mg total) by mouth daily.     guaiFENesin 600 MG 12 hr tablet  Commonly known as:  MUCINEX  Take 1 tablet (600 mg total) by mouth 2 (two) times daily.     Insulin Glargine 100 UNIT/ML Solostar Pen  Commonly known as:  LANTUS SOLOSTAR  10 units SQ qhs     insulin lispro 100  UNIT/ML KiwkPen  Commonly known as:  HUMALOG KWIKPEN  Take 5 units in the morning, 5 units at noon, --if blood sugar over 100 and you eat > 50% of meals     levothyroxine 88 MCG tablet  Commonly known as:  SYNTHROID, LEVOTHROID  Take 1 tablet (88 mcg total) by mouth daily.     magnesium oxide 400 (241.3 MG) MG tablet  Commonly known as:  MAG-OX  Take 1 tablet (400 mg total) by mouth 3 (three) times daily.     pantoprazole 40 MG tablet  Commonly known as:  PROTONIX  Take 1 tablet (40 mg total) by mouth daily.     potassium chloride SA 20 MEQ tablet  Commonly known as:  KLOR-CON M20  Take 1 tablet (20 mEq total) by mouth daily.     pravastatin 80 MG tablet  Commonly known as:  PRAVACHOL  Take 1 tablet (80 mg total) by mouth at bedtime.     tiotropium 18 MCG inhalation capsule  Commonly known as:  SPIRIVA HANDIHALER  PLACE ONE CAPSULE INTO INHALER AND  INHALE  DAILY     torsemide 20 MG tablet  Commonly known as:  DEMADEX  Take 1 tablet (20 mg total) by mouth daily.  Start taking on:  01/30/2015        Discharge Assessment: Filed Vitals:   01/29/15 0942  BP: 103/42  Pulse: 77  Temp:   Resp:   Skin clean, dry and intact without evidence of skin break down, no evidence of skin tears noted. IV catheter discontinued intact. Site without signs and symptoms of complications - no redness or edema noted at insertion site, patient denies c/o pain - only slight tenderness at site.  Dressing with slight pressure applied.  D/c Instructions-Education: Discharge instructions given to patient/family with verbalized understanding. D/c education completed with patient/family including follow up instructions, medication list, d/c activities limitations if indicated, with other d/c instructions as indicated by MD - patient able to verbalize understanding, all questions fully answered. Patient instructed to return to ED, call 911, or call MD for any changes in condition.  Patient escorted via  [place], and D/C home via private auto.  Salley Slaughter, RN 01/29/2015 1:22 PM

## 2015-01-29 NOTE — Progress Notes (Signed)
Occupational Therapy Treatment Patient Details Name: [name] MRN: [id number] DOB: [birth year] Today's Date: 01/29/2015    History of present illness Pt is a 79 y.o. M admitted after episode of syncope, wife found him on the floor.  [name] PMH includes ventricular tachycardia, HTN, PVD, CAD, COPD, anxiety, DMII, chronic LBP.   OT comments  Pt progressing and planning to d/c home today. Education provided in session.  Follow Up Recommendations  Home health OT;Supervision/Assistance - 24 hour    Equipment Recommendations  3 in 1 bedside comode    Recommendations for Other Services      Precautions / Restrictions Precautions Precautions: Fall Restrictions Weight Bearing Restrictions: No       Mobility Bed Mobility     General bed mobility comments: not assessed  Transfers Overall transfer level: Needs assistance Equipment used: Rolling walker (2 wheeled) Transfers: Sit to/from Stand Sit to Stand: Min guard;Min assist         General transfer comment: assist to boost 1x without use of armrests.     Balance  Slight LOB at sink but pt able to self-correct. No physical assist needed.  Used RW for ambulation.                                  ADL Overall ADL's : Needs assistance/impaired     Grooming: Wash/dry face;Oral care;Brushing hair;Set up;Supervision/safety;Standing           Upper Body Dressing : Supervision/safety;Sitting (doffed back gown)   Lower Body Dressing: Moderate assistance;Sit to/from stand Lower Body Dressing Details (indicate cue type and reason): wife assisted with donning pt's shoes and pulling up pants, but feel pt could have done more of this.  Toilet Transfer: Minimal assistance;Min guard;Ambulation;RW (Min assist to boost 1x from chair without armrests. )           Functional mobility during ADLs: Min guard;Rolling walker General ADL Comments: Educated on energy conservation. Educated on safety such as safe  footwear, use of bag/basket on walker, and sitting for LB ADLs. Educated on options to help him with toilet transfer (DME).       Vision                     Perception     Praxis      Cognition  Awake/Alert Behavior During Therapy: WFL for tasks assessed/performed Overall Cognitive Status: Within Functional Limits for tasks assessed                       Extremity/Trunk Assessment               Exercises     Shoulder Instructions       General Comments      Pertinent Vitals/ Pain       Pain Assessment: No/denies pain  Home Living                                          Prior Functioning/Environment              Frequency Min 2X/week     Progress Toward Goals  OT Goals(current goals can now be found in the care plan section)  Progress towards OT goals: Progressing toward goals  Acute Rehab OT Goals Patient Stated Goal: wants  to go home OT Goal Formulation: With patient Time For Goal Achievement: 02/06/15 Potential to Achieve Goals: Good ADL Goals Pt Will Perform Grooming: with supervision;standing Pt Will Perform Upper Body Bathing: with set-up;with supervision;sitting Pt Will Perform Upper Body Dressing: with set-up;with supervision;sitting Pt Will Transfer to Toilet: with supervision;ambulating;regular height toilet Pt Will Perform Toileting - Clothing Manipulation and hygiene: with supervision;sit to/from stand Additional ADL Goal #1: Patient will be independent with utilizing 3 energy conservation techniques during ADLs  Plan Discharge plan remains appropriate    Co-evaluation                 End of Session Equipment Utilized During Treatment: Gait belt;Rolling walker   Activity Tolerance Patient tolerated treatment well   Patient Left in chair;with family/visitor present;with chair alarm set   Nurse Communication Other (comment) (spoke with tech-wife wanted BP checked)        Time:  4315-4008 OT Time Calculation (min): 19 min  Charges: OT General Charges $OT Visit: 1 Procedure OT Treatments $Self Care/Home Management : 8-22 mins  Benito Mccreedy OTR/L [phone] 01/29/2015, 10:14 AM

## 2015-01-29 NOTE — Care Management Note (Signed)
Case Management Note  Patient Details  Name: [name] MRN: [id number] Date of Birth: 1935/09/07  Subjective/Objective:                 Patient to discharge today, [facility] to provide 3in1 to room prior to discharge. Referral made to [facility] with [facility] for [facility] RN PT [facility]   Action/Plan:   Expected Discharge Date:  01/22/15               Expected Discharge Plan:  [place]  In-House Referral:     Discharge planning Services  CM Consult  Post Acute Care Choice:  Durable Medical Equipment, Home Health Choice offered to:  Spouse, Adult Children  DME Arranged:  3-N-1 DME Agency:  [place] Arranged:  RN, PT, OT, Nurse's Aide [place] Agency:  [place]  Status of Service:  Completed, signed off  Medicare Important Message Given:  Yes-third notification given Date Medicare IM Given:    Medicare IM give by:    Date Additional Medicare IM Given:    Additional Medicare Important Message give by:     If discussed at [place] of Stay Meetings, dates discussed:    Additional Comments:  Carles Collet, RN 01/29/2015, 11:15 AM

## 2015-01-30 ENCOUNTER — Encounter (HOSPITAL_COMMUNITY): Payer: Medicare Other

## 2015-01-30 ENCOUNTER — Telehealth: Payer: Self-pay | Admitting: Family Medicine

## 2015-01-30 NOTE — Telephone Encounter (Signed)
Transition Care Management Follow-up Telephone Call   Date discharged? 01/30/15   How have you been since you were released from the hospital? weak   Do you understand why you were in the hospital? yes   Do you understand the discharge instructions? yes   Where were you discharged to? Home    Items Reviewed:  Medications reviewed: yes  Allergies reviewed: yes  Dietary changes reviewed: no  Referrals reviewed: no   Functional Questionnaire:   Activities of Daily Living (ADLs):   He states they are independent in the following: none States they require assistance with the following: none   Any transportation issues/concerns?: no   Any patient concerns? no   Confirmed importance and date/time of follow-up visits scheduled yes  Provider Appointment booked with  Confirmed with patient if condition begins to worsen call PCP or go to the ER.  Patient was given the office number and encouraged to call back with question or concerns.  : yes

## 2015-01-31 ENCOUNTER — Telehealth: Payer: Self-pay | Admitting: *Deleted

## 2015-01-31 ENCOUNTER — Encounter: Payer: Self-pay | Admitting: Family Medicine

## 2015-01-31 DIAGNOSIS — K761 Chronic passive congestion of liver: Secondary | ICD-10-CM | POA: Insufficient documentation

## 2015-01-31 LAB — CULTURE, BODY FLUID W GRAM STAIN -BOTTLE: Culture: NO GROWTH

## 2015-01-31 LAB — CULTURE, BODY FLUID-BOTTLE

## 2015-01-31 NOTE — Telephone Encounter (Signed)
[name] advised and voiced understanding. She stated that an order will need to be faxed to hospice. Please write order so this can be faxed. Thanks.

## 2015-01-31 NOTE — Telephone Encounter (Signed)
Louann with [facility] called stating that she was just in to see pt who was recently d/c from hospital. She stated that after examining pt and speaking to family they would like order for hospice consult. Louann wanted to know which hospice do you prefer. Please advise. Thanks. Per Dr. Anitra Lauth try [facility].

## 2015-01-31 NOTE — Telephone Encounter (Signed)
Order written

## 2015-01-31 NOTE — Telephone Encounter (Signed)
Order faxed to Hospice. Left message advising [name].

## 2015-02-02 ENCOUNTER — Ambulatory Visit: Payer: Medicare Other | Admitting: Family Medicine

## 2015-02-02 ENCOUNTER — Telehealth: Payer: Self-pay | Admitting: *Deleted

## 2015-02-02 NOTE — Telephone Encounter (Signed)
Orders for hospice: 1) Oxygen for comfort, 2L prn 2) D/C humalog, folic acid, magnesium, pravastatin, protonix, and synthroid. 3) Dulcolax 10mg  suppository, 1 suppository PR prn constipation 4) Prednisone 20mg , 2 tabs po qd x 5d, #10, no RF.

## 2015-02-02 NOTE — Telephone Encounter (Signed)
Maura with Hospice called stating that she would like to get a few order from Dr. Anitra Lauth for pt. 1) order for oxygen comfort 2L as needed 2) order to d/c Humalog due to pt not eating enough, last BS checked was earlier this week BS was 163 3) order to d/c the following medications: Folic Acid, Magnesium, pravastatin, and if asymptomatic stop Protonix due to pts request to decrease amount of medications he is taking 4) order to stop synthroid (she stated that she wasn't sure about this one since she does not have a recent TSH for pt, she stated that they normal d/c this medication) 5) order for laxative suppository due to benign colon tumor and tympanic bowel sounds 6) order for prednisone for wheezing (not necessary if concerned about steroid elevating BS). Please advise. Thanks. CB: [phone] Fax: [phone]

## 2015-02-02 NOTE — Telephone Encounter (Signed)
[name] advised and voiced understanding.

## 2015-02-05 ENCOUNTER — Ambulatory Visit: Payer: Medicare Other | Admitting: Family Medicine

## 2015-02-06 ENCOUNTER — Telehealth: Payer: Self-pay | Admitting: *Deleted

## 2015-02-06 NOTE — Telephone Encounter (Signed)
Left message for [name] to call back.  

## 2015-02-06 NOTE — Telephone Encounter (Signed)
Ok to give tylenol 1000 mg every 6 hours as needed for pain-thx

## 2015-02-06 NOTE — Telephone Encounter (Signed)
Maura advised and voiced understanding.

## 2015-02-06 NOTE — Telephone Encounter (Signed)
[name] called stating that pt normally takes Tylenol for pain. She stated that she needs an order for this from Dr. Anitra Lauth. Please advise. Thanks.

## 2015-02-07 ENCOUNTER — Telehealth (HOSPITAL_COMMUNITY): Payer: Self-pay | Admitting: *Deleted

## 2015-02-07 NOTE — Telephone Encounter (Signed)
Pt Hospice RN called to let us that know the family is requesting comfort care. Wondering if pt should still take Magnesium and K+.  Per Dr Anitra Lauth note from 02/02/15 pt had order to d/c magnesium.  Told family as long as pt is taking the diuretic the pt should also take the K+. Cristela Blue is aware and will let the family know.

## 2015-02-09 ENCOUNTER — Telehealth (HOSPITAL_COMMUNITY): Payer: Self-pay | Admitting: Cardiology

## 2015-02-09 NOTE — Telephone Encounter (Signed)
Maura,RN with Hospice called during pts home visit pts LE edema 2+, increased SOB with talking, and unable to ambulate with out difficulty   However pt is sitting in a recliner and feet a are not elevated And pt has not taken any torsemide today Advised to take torsemide dose for the day, be sure to keep feet elevated as much as possible to help with swelling

## 2015-02-13 ENCOUNTER — Ambulatory Visit (HOSPITAL_COMMUNITY)
Admission: RE | Admit: 2015-02-13 | Discharge: 2015-02-13 | Disposition: A | Discharge status: Home / Self Care | Source: Ambulatory Visit | Attending: Cardiology | Admitting: Cardiology

## 2015-02-13 VITALS — BP 110/52 | HR 86 | Wt 136.4 lb

## 2015-02-13 DIAGNOSIS — Z95810 Presence of automatic (implantable) cardiac defibrillator: Secondary | ICD-10-CM | POA: Insufficient documentation

## 2015-02-13 DIAGNOSIS — I4891 Unspecified atrial fibrillation: Secondary | ICD-10-CM | POA: Diagnosis not present

## 2015-02-13 DIAGNOSIS — I4892 Unspecified atrial flutter: Secondary | ICD-10-CM | POA: Diagnosis not present

## 2015-02-13 DIAGNOSIS — Z515 Encounter for palliative care: Secondary | ICD-10-CM | POA: Diagnosis not present

## 2015-02-13 DIAGNOSIS — Z79899 Other long term (current) drug therapy: Secondary | ICD-10-CM | POA: Diagnosis not present

## 2015-02-13 DIAGNOSIS — I2510 Atherosclerotic heart disease of native coronary artery without angina pectoris: Secondary | ICD-10-CM | POA: Diagnosis not present

## 2015-02-13 DIAGNOSIS — G8929 Other chronic pain: Secondary | ICD-10-CM | POA: Diagnosis not present

## 2015-02-13 DIAGNOSIS — E780 Pure hypercholesterolemia: Secondary | ICD-10-CM | POA: Diagnosis not present

## 2015-02-13 DIAGNOSIS — N259 Disorder resulting from impaired renal tubular function, unspecified: Secondary | ICD-10-CM

## 2015-02-13 DIAGNOSIS — I5022 Chronic systolic (congestive) heart failure: Secondary | ICD-10-CM | POA: Diagnosis not present

## 2015-02-13 DIAGNOSIS — R188 Other ascites: Secondary | ICD-10-CM | POA: Diagnosis not present

## 2015-02-13 DIAGNOSIS — E11319 Type 2 diabetes mellitus with unspecified diabetic retinopathy without macular edema: Secondary | ICD-10-CM | POA: Diagnosis not present

## 2015-02-13 DIAGNOSIS — I10 Essential (primary) hypertension: Secondary | ICD-10-CM | POA: Diagnosis not present

## 2015-02-13 DIAGNOSIS — Z794 Long term (current) use of insulin: Secondary | ICD-10-CM | POA: Insufficient documentation

## 2015-02-13 DIAGNOSIS — I255 Ischemic cardiomyopathy: Secondary | ICD-10-CM | POA: Diagnosis not present

## 2015-02-13 DIAGNOSIS — E1122 Type 2 diabetes mellitus with diabetic chronic kidney disease: Secondary | ICD-10-CM | POA: Insufficient documentation

## 2015-02-13 DIAGNOSIS — E119 Type 2 diabetes mellitus without complications: Secondary | ICD-10-CM | POA: Insufficient documentation

## 2015-02-13 DIAGNOSIS — I480 Paroxysmal atrial fibrillation: Secondary | ICD-10-CM | POA: Diagnosis not present

## 2015-02-13 DIAGNOSIS — I129 Hypertensive chronic kidney disease with stage 1 through stage 4 chronic kidney disease, or unspecified chronic kidney disease: Secondary | ICD-10-CM | POA: Diagnosis not present

## 2015-02-13 DIAGNOSIS — M545 Low back pain: Secondary | ICD-10-CM | POA: Insufficient documentation

## 2015-02-13 DIAGNOSIS — I951 Orthostatic hypotension: Secondary | ICD-10-CM | POA: Diagnosis not present

## 2015-02-13 DIAGNOSIS — N183 Chronic kidney disease, stage 3 (moderate): Secondary | ICD-10-CM | POA: Diagnosis not present

## 2015-02-13 DIAGNOSIS — E871 Hypo-osmolality and hyponatremia: Secondary | ICD-10-CM | POA: Insufficient documentation

## 2015-02-13 DIAGNOSIS — J449 Chronic obstructive pulmonary disease, unspecified: Secondary | ICD-10-CM | POA: Insufficient documentation

## 2015-02-13 DIAGNOSIS — I739 Peripheral vascular disease, unspecified: Secondary | ICD-10-CM | POA: Diagnosis not present

## 2015-02-13 DIAGNOSIS — Z951 Presence of aortocoronary bypass graft: Secondary | ICD-10-CM | POA: Insufficient documentation

## 2015-02-13 DIAGNOSIS — R42 Dizziness and giddiness: Secondary | ICD-10-CM

## 2015-02-13 LAB — BASIC METABOLIC PANEL
ANION GAP: 12 (ref 5–15)
BUN: 44 mg/dL — ABNORMAL HIGH (ref 6–20)
CALCIUM: 8.1 mg/dL — AB (ref 8.9–10.3)
CO2: 24 mmol/L (ref 22–32)
Chloride: 89 mmol/L — ABNORMAL LOW (ref 101–111)
Creatinine, Ser: 1.56 mg/dL — ABNORMAL HIGH (ref 0.61–1.24)
GFR, EST AFRICAN AMERICAN: 48 mL/min — AB (ref >60)
GFR, EST NON AFRICAN AMERICAN: 41 mL/min — AB (ref >60)
Glucose, Bld: 215 mg/dL — ABNORMAL HIGH (ref 65–99)
Potassium: 4.5 mmol/L (ref 3.5–5.1)
Sodium: 125 mmol/L — ABNORMAL LOW (ref 135–145)

## 2015-02-13 MED ORDER — POTASSIUM CHLORIDE CRYS ER 20 MEQ PO TBCR
40.0000 meq | EXTENDED_RELEASE_TABLET | Freq: Every day | ORAL | Status: DC
Start: 2015-02-13 — End: 2015-02-13

## 2015-02-13 MED ORDER — TORSEMIDE 20 MG PO TABS: 40.0000 mg | ORAL_TABLET | Freq: Every day | ORAL | Status: DC

## 2015-02-13 MED ORDER — POTASSIUM CHLORIDE CRYS ER 20 MEQ PO TBCR: 40.0000 meq | EXTENDED_RELEASE_TABLET | Freq: Every day | ORAL | Status: AC

## 2015-02-13 MED ORDER — TORSEMIDE 20 MG PO TABS: 40.0000 mg | ORAL_TABLET | Freq: Every day | ORAL | Status: AC

## 2015-02-13 NOTE — Progress Notes (Signed)
Patient ID: KAIYAN LUCZAK, male   DOB: 22-Jul-1935, 79 y.o.   MRN: [id number] PCP: Dr Ernestine Conrad GI: Dr Earlean Shawl.    HPI: Mr. Sadowski is a 79 yo male with a history of HTN, CAD s/p CABG, ICM s/p Medtronic ICD, chronic systolic heart failure, DM2, Vtach, atrial flutter, PVD and GI mass. ECHO (12/22/13) EF 20-25%, Echo  EF 40% (12/15)  Mod-severe MR.   In 9/15 he was admitted from Dr. Rosezella [place] office with volume overload and low output heart failure. Creatinine was 2.8 on admit (baseline 1.6). Central line was placed to monitor CVP and CO-OX. Initial CO-OX was 49% and he was started on Milrinone and IV lasix. Discharge weight was 151 pounds. Creatinine was 1.42.   Saw Dr Earlean Shawl for possible colon mass/thickening and felt to be result of previous diverticulitis.   Was admitted 2x in 4/16. In early April had a/c CHF with cardiorenal syndrome. Treated with milrinone and improved. Also had GI bleed at that time and EGD with candidal esophagitis. Also two colonic AVMs. Eliquis eventually stopped due to recurrent bleeding and falls. In late April admitted with PNA. Treated with abx. D/c weight was 147.   He was admitted in 77/16 with orthostatic dizziness and poor po intake.  Hydralazine and Imdur were stopped with improvement in symptoms.  Echo in 7/16 showed EF 25-30% with moderately dilated LV and moderate MR.   He has had a significant functional decline recently.  He was admitted in 9/16 with hyponatremia and near syncope.  He was orthostatic, likely due to over-diuresis.  He had a paracentesis in the hospital.  After getting home, he and his family decided on hospice care.  He is doing very little walking.  He is short of breath with any exertion.  He is using oxygen all the time.  His wife cannot get him out of bed alone. He has a chronic cough.  He has low back pain.  Abdomen is distended.  Currently wheezing with pursed-lip breathing.    Optivol shows decreasing thoracic impedance with fluid index >  threshold.    Echo 12/15 EF  ~40% inferior AK moderate to severe MR. RV mildly HK Echo 4/16  EF 40-45% moderate to severe MR RV moderate dysfunction RVSP 78 Echo 7/16 EF 25-30%, moderately dilated LV, moderate MR.   Labs 02/09/14 K 3.9 Creatinine 1.38 Pro BNP 2555  Labs 02/20/14 K 3.5 NA 127 Creatinine 1.6  03/01/14 TSH 19.8  T4  1.03 T3 84  Labs 03/03/14 CEA 9.0  K 4.3 creatinine 1.2 NA 134 Hgb 12.7 TSH 19.8 T3 84.9 T4 1.03  Labs 09/20/2014: K 3.3 Creatinine 2.05  Labs 09/27/2014: Na 124 K 3.6 Creatinine 1.88 Labs 11/13/2014: K 3.1 Creatinine 2.05  Na 128  Labs 7/16: K 4.1, creatinine 2.39, HCT 32.3, LFTs normal, TSH normal Labs 9/16: K 4.7, creatinine 1.55  CT Virtual Colonoscopy- Diverticulosis ? Rectosigmoid Colon Ca  ROS: All systems negative except as listed in HPI, PMH and Problem List.  Past Medical History  Diagnosis Date  . VENTRICULAR TACHYCARDIA   . PERIPHERAL VASCULAR DISEASE   . HYPERTENSION     Hypertensive retinopathy-grade II OU  . HYPERCHOLESTEROLEMIA   . CORONARY ARTERY DISEASE     Hx of CABG  . Chronic systolic heart failure     NYHA class IV: Ischemic cardiomyopathy (01/2014 EF 40-45% with hypokinesis + small area of akinesis; 08/2014, Echo with EF 40-45 percent, PAS 78.  11/2014 ECHO EF 25-30%, inferior akinesis, dilated  LV, moderate mitral regurg.  . SEBORRHEIC DERMATITIS   . Chronic renal insufficiency, stage III (moderate)     CrCl 40s-50s  . DIVERTICULOSIS OF COLON   . COPD, severe   . COLONIC POLYPS   . ANXIETY   . Type II diabetes mellitus     +background diabetic retinopathy OU  . Chronic lower back pain   . DJD (degenerative joint disease)     "hands" (05/24/2012)  . Osteoarthritis of finger   . History of atrial flutter   . Thrombus 12/2013    on ICD lead--started anticoag and his cardioversion for atrial flutter was postponed.  . Colon stricture 2015    with features worrisome for mass, also with recent rising CEA level (possible colon cancer)-GI  MD= Dr. Jackelyn Hoehn has been declining colonoscopy since that time.  Dr. Liliane Channel impression on re-eval in 2015 was that the area represented chronic post-diverticulitis changes, not malignancy.  . Macular degeneration, age related, nonexudative     OU  . Chronic blood loss anemia     Transfused 2 U pRBCs April, May, and June (10/26/14) of 2016.  GI w/u has revealed only 2 tiny non-bleeding AVMs in mid small bowel.  Marland Kitchen AICD (automatic cardioverter/defibrillator) present     Current Outpatient Prescriptions  Medication Sig Dispense Refill  . amiodarone (PACERONE) 200 MG tablet Take 0.5 tablets (100 mg total) by mouth daily. 30 tablet 3  . Fluticasone-Salmeterol (ADVAIR) 500-50 MCG/DOSE AEPB Inhale 1 puff into the lungs 2 (two) times daily. 60 each 11  . guaiFENesin (MUCINEX) 600 MG 12 hr tablet Take 1 tablet (600 mg total) by mouth 2 (two) times daily. 30 tablet 0  . Insulin Glargine (LANTUS SOLOSTAR) 100 UNIT/ML Solostar Pen 10 units SQ qhs (Patient taking differently: Inject 10 Units into the skin at bedtime. ) 15 mL 11  . insulin lispro (HUMALOG KWIKPEN) 100 UNIT/ML KiwkPen Take 5 units in the morning, 5 units at noon, --if blood sugar over 100 and you eat > 50% of meals (Patient taking differently: Inject 7-9 Units into the skin 3 (three) times daily. Take 7 units in the morning, 8 units at noon, and 9 units at dinner) 5 pen 3  . magnesium oxide (MAG-OX) 400 (241.3 MG) MG tablet Take 1 tablet (400 mg total) by mouth 3 (three) times daily. 90 tablet 3  . pantoprazole (PROTONIX) 40 MG tablet Take 1 tablet (40 mg total) by mouth daily. 30 tablet 1  . potassium chloride SA (KLOR-CON M20) 20 MEQ tablet Take 2 tablets (40 mEq total) by mouth daily. 60 tablet 2  . tiotropium (SPIRIVA HANDIHALER) 18 MCG inhalation capsule PLACE ONE CAPSULE INTO INHALER AND  INHALE  DAILY 30 capsule 6  . torsemide (DEMADEX) 20 MG tablet Take 2 tablets (40 mg total) by mouth daily. 60 tablet 2   No current  facility-administered medications for this encounter.     PHYSICAL EXAM: Filed Vitals:   02/13/15 1058  BP: 110/52  Pulse: 86  Weight: 136 lb 6.4 oz (61.871 kg)  SpO2: 84%    General:  Frail appearing, pursed lip breathing. Wife and daughter present  HEENT: normal Neck: supple. JVP 9-10 cm. No bruits. No lymphadenopathy or thryomegaly appreciated. Cor: PMI normal. Regular rate & rhythm. No rubs, gallops or murmurs. Lungs: Wheezes bilaterally Abdomen: soft, nontender, moderately distended. No hepatosplenomegaly. No bruits or masses. Good bowel sounds. Extremities: no cyanosis, clubbing, rash, 1+ edema to knees bilaterally. Neuro: alert & orientedx3, cranial nerves grossly intact.  Moves all 4 extremities w/o difficulty. Affect pleasant.  ASSESSMENT & PLAN: Mr Garrel Ridgel is a 79 year old with ischemic cardiomyopathy and chronic systolic heart failure with history of cardiogenic shock requiring short term milrinone.  He has taken a recent downturn and is now getting hospice care.  1. Chronic systolic CHF:  Ischemic cardiomyopathy. Has Medtronic ICD. ECHO 7/16 EF 25-30%. NYHA IV symptoms.  He is volume overloaded.  No orthostatic symptoms but very rarely standing up.  Significant ascites on exam.  - Increase torsemide to 40 mg daily and KCl to 40 daily.  BMET today.  Hard to get to bathroom, will ask hospice nurse to place condom catheter.  - No BP-active meds other than torsemide given h/o severe orthostasis. - Abdomen is uncomfortable.  Will arrange for US-guided paracentesis.   2. A fib/Aflutter: Maintaining SR on amiodarone.  No anticoagulation with fall risk and low plts.  TSH and LFTS normal in 7/16.  - Eliquis stopped due to recurrent GI bleeding and fall risk.  4. CKD: CKD III. Check BMET today.  5. DM: Per PCP.  6. CAD: s/p CABG.  Off statin now that he is under hospice care.  Not on ASA with apixaban use.  7. COPD 8. H/o VT: No recent episodes.  9. Hyponatremia 10. Orthostatic  hypotension: Rarely getting out of bed so hard to tell how much this is present.  11. End of life: He is under hospice care.  Today, we discussed his ICD.  I will have the Medtronic rep deactivate the defibrillation therapies. He and his wife and daughter are in agreement about this.   Dalton McLean,MD 02/13/2015

## 2015-02-13 NOTE — Progress Notes (Signed)
All pt instructions have been called and reported to [place], RN with Hospice, she stated she would go by and see pt this afternoon, update pill box and give condom cath

## 2015-02-13 NOTE — Patient Instructions (Signed)
Increase Torsemide to 40 mg daily  Increase Potassium to 40 meq daily  Labs today  Paracentesis tomorrow 9/21 at 3 pm at [facility], please arrive to radiology at 2:45 pm  Ok to use condom cath, if that does not work hospice may place foley cath  Follow up with [place] as needed

## 2015-02-14 ENCOUNTER — Ambulatory Visit (HOSPITAL_COMMUNITY)
Admission: RE | Admit: 2015-02-14 | Discharge: 2015-02-14 | Disposition: A | Discharge status: Home / Self Care | Payer: Medicare Other | Source: Ambulatory Visit | Attending: Cardiology | Admitting: Cardiology

## 2015-02-14 ENCOUNTER — Telehealth: Payer: Self-pay

## 2015-02-14 DIAGNOSIS — I5022 Chronic systolic (congestive) heart failure: Secondary | ICD-10-CM

## 2015-02-14 DIAGNOSIS — R188 Other ascites: Secondary | ICD-10-CM | POA: Diagnosis not present

## 2015-02-14 NOTE — Telephone Encounter (Signed)
Talked with hospice nurse. Pt saw cardiologist yesterday and comfort care was re-emphasized. He'll get paracentesis today. Due to labored resps/DOE they will also be starting IV morphine 5mg  q4h prn pain or SOB--per their usual protocol. Hospice nurse with soon be starting foley cath as well. She'll continue to keep me UTD.

## 2015-02-24 DEATH — deceased

## 2015-03-01 ENCOUNTER — Ambulatory Visit: Payer: Medicare Other | Admitting: Internal Medicine

## 2015-03-08 ENCOUNTER — Encounter: Payer: Self-pay | Admitting: Cardiology

## 2015-04-16 ENCOUNTER — Ambulatory Visit: Payer: Medicare Other | Admitting: Family Medicine

## 2015-05-02 ENCOUNTER — Ambulatory Visit (INDEPENDENT_AMBULATORY_CARE_PROVIDER_SITE_OTHER): Payer: Medicare Other | Admitting: Ophthalmology

## 2015-07-04 ENCOUNTER — Ambulatory Visit: Payer: Medicare Other | Admitting: Pulmonary Disease

## 2016-07-05 IMAGING — CR DG CHEST 1V PORT
1 series · 2 of 2 positions shown · non-contrast
Comparison: Fell portable exam 6522 hours compared to 9559 hours

CLINICAL DATA: Central line placement, history hypertension,
coronary artery disease, carotid artery disease, chronic renal
insufficiency stage III, type 2 diabetes, former smoker

EXAM:
PORTABLE CHEST - 1 VIEW

[Series 1: portable · 0.17mm/px · 2 of 2 slices shown]
[im 1/2]
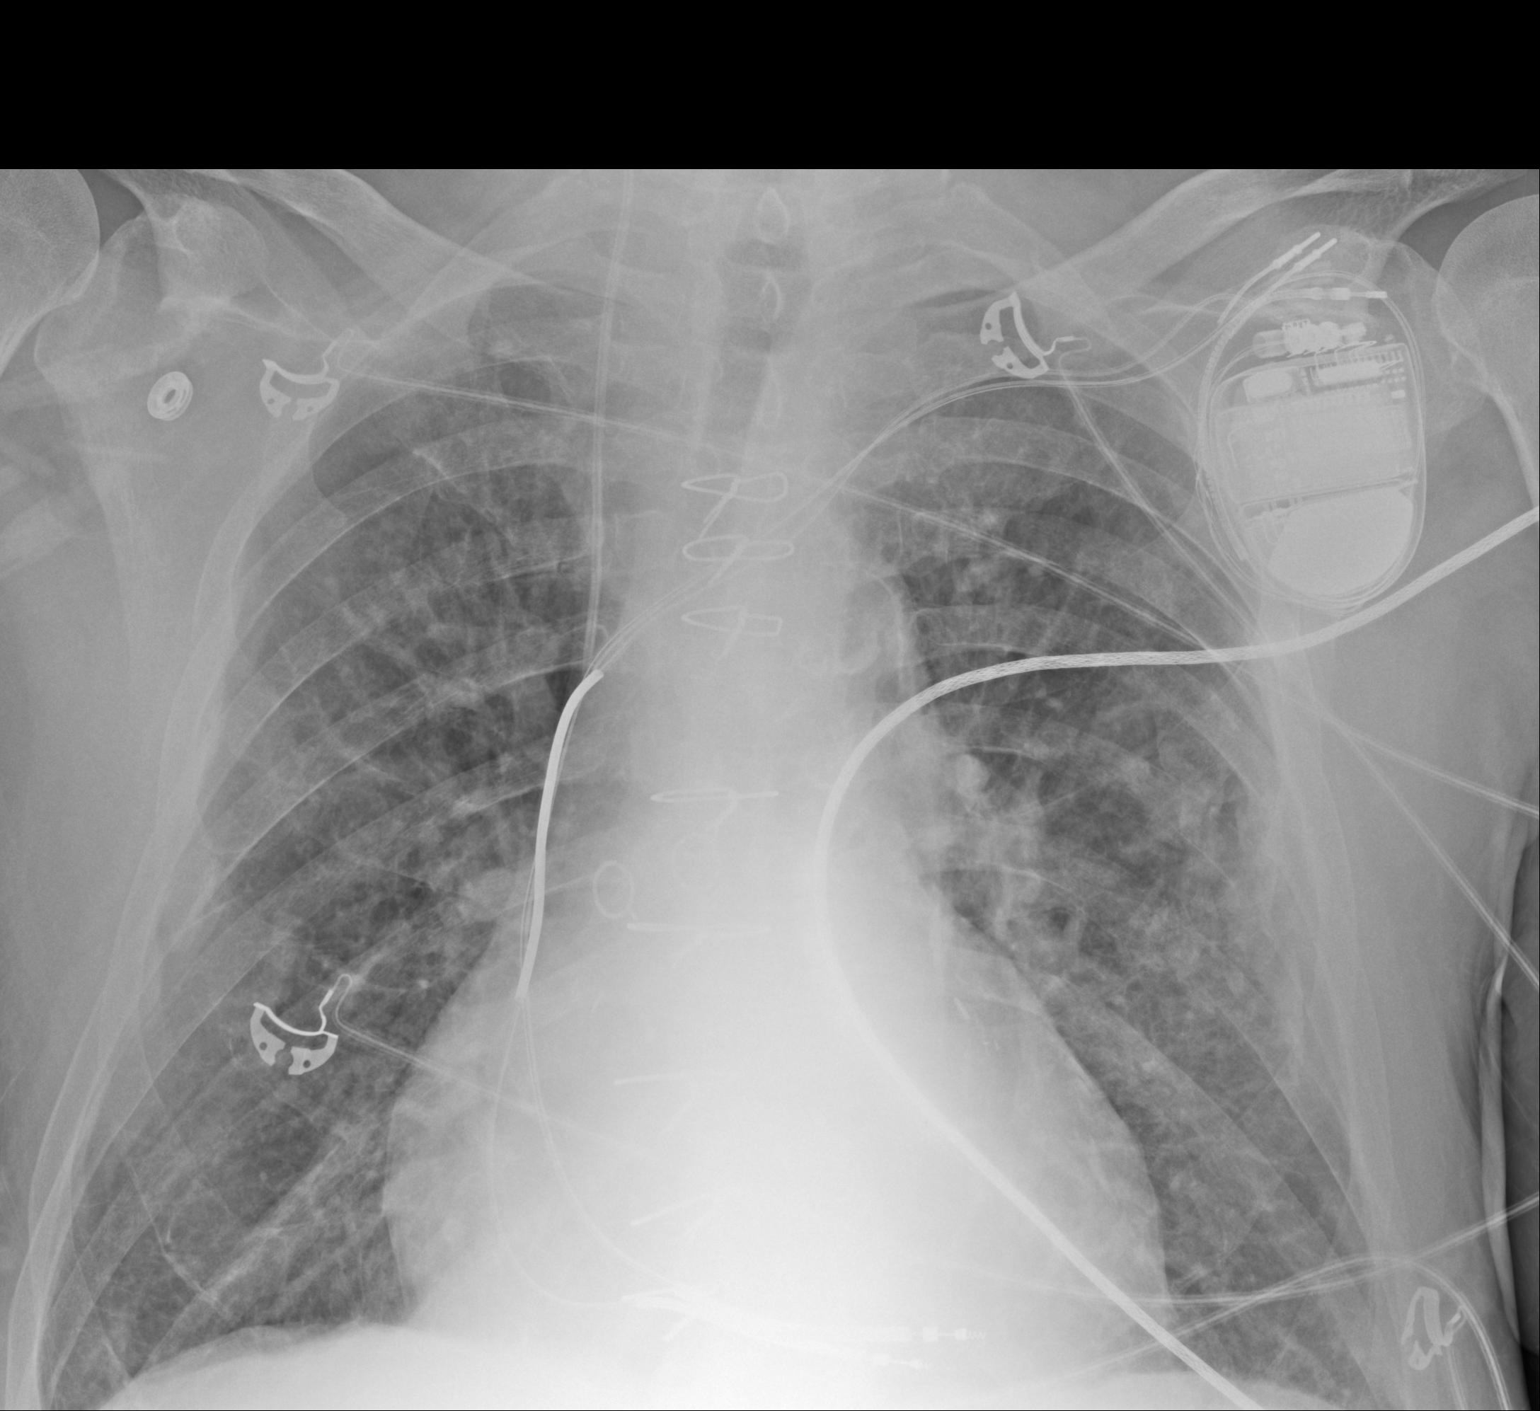
[im 2/2]
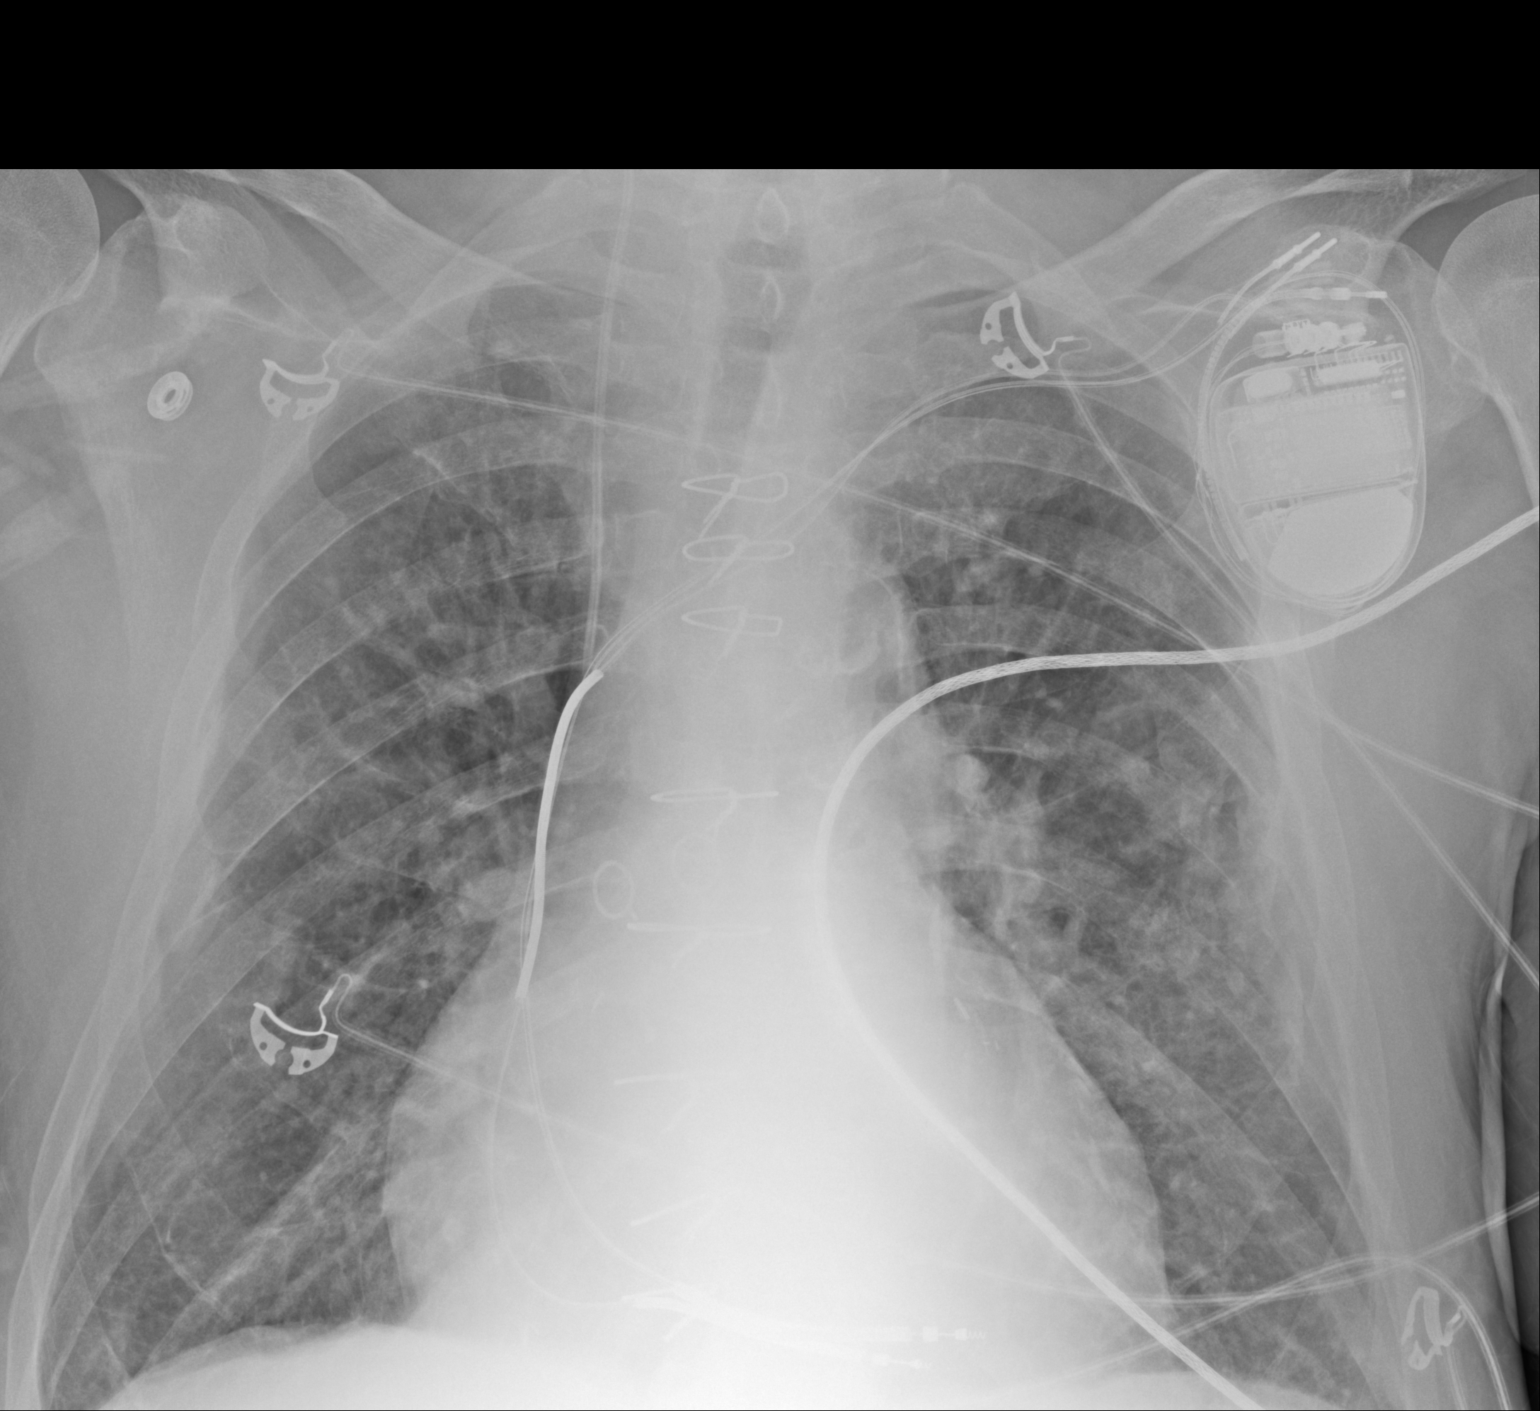

[2 of 2 positions shown; findings below may reference images not displayed]

FINDINGS: New RIGHT jugular central venous catheter with tip projecting over
SVC.

LEFT subclavian pacemaker/AICD leads with tips projecting over RIGHT
ventricle.

Enlargement of cardiac silhouette post CABG with pulmonary vascular
congestion.

Mild perihilar infiltrates slightly greater on LEFT favor pulmonary
edema.

No pleural effusion or pneumothorax.

Atherosclerotic calcification aorta.
IMPRESSION: No pneumothorax following RIGHT jugular line placement.

Mild pulmonary edema/CHF.

## 2018-02-04 NOTE — Telephone Encounter (Signed)
deceased patient 

## 2022-05-21 NOTE — Progress Notes (Signed)
This encounter was created in error - please disregard.
